# Patient Record
Sex: Male | Born: 1960 | Race: Black or African American | Hispanic: No | Smoking: Former smoker
Health system: Southern US, Community
[De-identification: ages and names within clinical notes are randomized; demographics above are authoritative.]

## PROBLEM LIST (undated history)

## (undated) DIAGNOSIS — D72829 Elevated white blood cell count, unspecified: Secondary | ICD-10-CM

## (undated) DIAGNOSIS — K439 Ventral hernia without obstruction or gangrene: Secondary | ICD-10-CM

## (undated) DIAGNOSIS — D689 Coagulation defect, unspecified: Secondary | ICD-10-CM

## (undated) DIAGNOSIS — IMO0001 Reserved for inherently not codable concepts without codable children: Secondary | ICD-10-CM

## (undated) DIAGNOSIS — J45909 Unspecified asthma, uncomplicated: Secondary | ICD-10-CM

## (undated) DIAGNOSIS — F209 Schizophrenia, unspecified: Secondary | ICD-10-CM

## (undated) DIAGNOSIS — F32A Depression, unspecified: Secondary | ICD-10-CM

## (undated) DIAGNOSIS — R918 Other nonspecific abnormal finding of lung field: Secondary | ICD-10-CM

## (undated) DIAGNOSIS — J189 Pneumonia, unspecified organism: Secondary | ICD-10-CM

## (undated) DIAGNOSIS — Z8719 Personal history of other diseases of the digestive system: Secondary | ICD-10-CM

## (undated) DIAGNOSIS — J449 Chronic obstructive pulmonary disease, unspecified: Secondary | ICD-10-CM

## (undated) DIAGNOSIS — I10 Essential (primary) hypertension: Secondary | ICD-10-CM

## (undated) DIAGNOSIS — E876 Hypokalemia: Secondary | ICD-10-CM

## (undated) DIAGNOSIS — I639 Cerebral infarction, unspecified: Secondary | ICD-10-CM

## (undated) DIAGNOSIS — J9621 Acute and chronic respiratory failure with hypoxia: Secondary | ICD-10-CM

## (undated) DIAGNOSIS — F329 Major depressive disorder, single episode, unspecified: Secondary | ICD-10-CM

## (undated) DIAGNOSIS — K429 Umbilical hernia without obstruction or gangrene: Secondary | ICD-10-CM

## (undated) HISTORY — PX: HERNIA REPAIR: SHX51

---

## 2005-01-21 ENCOUNTER — Emergency Department: Payer: Self-pay | Admitting: Emergency Medicine

## 2005-04-03 ENCOUNTER — Emergency Department: Payer: Self-pay | Admitting: Emergency Medicine

## 2005-10-16 ENCOUNTER — Other Ambulatory Visit: Payer: Self-pay

## 2005-10-16 ENCOUNTER — Emergency Department: Payer: Self-pay | Admitting: Emergency Medicine

## 2007-05-26 ENCOUNTER — Emergency Department: Payer: Self-pay | Admitting: Emergency Medicine

## 2007-05-27 ENCOUNTER — Other Ambulatory Visit: Payer: Self-pay

## 2010-06-23 ENCOUNTER — Ambulatory Visit: Payer: Self-pay | Admitting: Gastroenterology

## 2011-11-02 ENCOUNTER — Inpatient Hospital Stay: Payer: Self-pay | Admitting: *Deleted

## 2011-11-02 LAB — URINALYSIS, COMPLETE
Bacteria: NONE SEEN
Bilirubin,UR: NEGATIVE
Glucose,UR: NEGATIVE mg/dL (ref 0–75)
Ketone: NEGATIVE
Ph: 5 (ref 4.5–8.0)
Protein: NEGATIVE
RBC,UR: <1 /HPF (ref 0–5)
Specific Gravity: 1.024 (ref 1.003–1.030)
Squamous Epithelial: <1

## 2011-11-02 LAB — COMPREHENSIVE METABOLIC PANEL
Anion Gap: 15 (ref 7–16)
BUN: 12 mg/dL (ref 7–18)
Chloride: 102 mmol/L (ref 98–107)
Co2: 20 mmol/L — ABNORMAL LOW (ref 21–32)
EGFR (African American): >60
Glucose: 115 mg/dL — ABNORMAL HIGH (ref 65–99)
Osmolality: 274 (ref 275–301)
Potassium: 3.5 mmol/L (ref 3.5–5.1)
Total Protein: 7.7 g/dL (ref 6.4–8.2)

## 2011-11-02 LAB — PRO B NATRIURETIC PEPTIDE: B-Type Natriuretic Peptide: 232 pg/mL — ABNORMAL HIGH (ref 0–125)

## 2011-11-02 LAB — DRUG SCREEN, URINE
Amphetamines, Ur Screen: NEGATIVE (ref ?–1000)
Barbiturates, Ur Screen: NEGATIVE (ref ?–200)
Cannabinoid 50 Ng, Ur ~~LOC~~: NEGATIVE (ref ?–50)
Methadone, Ur Screen: NEGATIVE (ref ?–300)
Phencyclidine (PCP) Ur S: NEGATIVE (ref ?–25)
Tricyclic, Ur Screen: NEGATIVE (ref ?–1000)

## 2011-11-02 LAB — CBC
HCT: 38.4 % — ABNORMAL LOW (ref 40.0–52.0)
MCH: 31.8 pg (ref 26.0–34.0)
Platelet: 172 10*3/uL (ref 150–440)
RBC: 3.99 10*6/uL — ABNORMAL LOW (ref 4.40–5.90)

## 2011-11-02 LAB — PROTIME-INR
INR: 1.2
Prothrombin Time: 15.4 secs — ABNORMAL HIGH (ref 11.5–14.7)

## 2011-11-02 LAB — CK TOTAL AND CKMB (NOT AT ARMC): CK, Total: 2388 U/L — ABNORMAL HIGH (ref 35–232)

## 2011-11-03 LAB — BASIC METABOLIC PANEL
Anion Gap: 10 (ref 7–16)
Calcium, Total: 8.9 mg/dL (ref 8.5–10.1)
Chloride: 109 mmol/L — ABNORMAL HIGH (ref 98–107)
Co2: 23 mmol/L (ref 21–32)
Creatinine: 0.77 mg/dL (ref 0.60–1.30)
EGFR (African American): >60
Potassium: 4.3 mmol/L (ref 3.5–5.1)
Sodium: 142 mmol/L (ref 136–145)

## 2011-11-03 LAB — CBC WITH DIFFERENTIAL/PLATELET
Basophil #: 0 10*3/uL (ref 0.0–0.1)
Basophil %: 0.1 %
Eosinophil #: 0 10*3/uL (ref 0.0–0.7)
HCT: 36.6 % — ABNORMAL LOW (ref 40.0–52.0)
HGB: 12 g/dL — ABNORMAL LOW (ref 13.0–18.0)
MCH: 32.1 pg (ref 26.0–34.0)
MCHC: 32.7 g/dL (ref 32.0–36.0)
MCV: 98 fL (ref 80–100)
Monocyte #: 0.5 10*3/uL (ref 0.0–0.7)
Monocyte %: 3.9 %
Neutrophil #: 10.6 10*3/uL — ABNORMAL HIGH (ref 1.4–6.5)
Neutrophil %: 88.6 %
RBC: 3.72 10*6/uL — ABNORMAL LOW (ref 4.40–5.90)
RDW: 14 % (ref 11.5–14.5)
WBC: 12 10*3/uL — ABNORMAL HIGH (ref 3.8–10.6)

## 2011-11-04 LAB — CBC WITH DIFFERENTIAL/PLATELET
Basophil #: 0 10*3/uL (ref 0.0–0.1)
Basophil %: 0 %
Eosinophil #: 0 10*3/uL (ref 0.0–0.7)
Eosinophil %: 0 %
HCT: 36 % — ABNORMAL LOW (ref 40.0–52.0)
Lymphocyte #: 1.1 10*3/uL (ref 1.0–3.6)
Lymphocyte %: 7.1 %
MCH: 32.3 pg (ref 26.0–34.0)
MCHC: 33 g/dL (ref 32.0–36.0)
Monocyte #: 0.6 10*3/uL (ref 0.0–0.7)
Neutrophil #: 13.3 10*3/uL — ABNORMAL HIGH (ref 1.4–6.5)
Neutrophil %: 88.9 %
Platelet: 171 10*3/uL (ref 150–440)
RBC: 3.68 10*6/uL — ABNORMAL LOW (ref 4.40–5.90)

## 2011-11-04 LAB — CK: CK, Total: 2752 U/L — ABNORMAL HIGH (ref 35–232)

## 2011-11-04 LAB — BASIC METABOLIC PANEL
Anion Gap: 10 (ref 7–16)
Calcium, Total: 8.8 mg/dL (ref 8.5–10.1)
Co2: 23 mmol/L (ref 21–32)
EGFR (Non-African Amer.): >60
Osmolality: 280 (ref 275–301)
Potassium: 3.8 mmol/L (ref 3.5–5.1)

## 2011-11-05 LAB — CBC WITH DIFFERENTIAL/PLATELET
Basophil %: 0.2 %
Eosinophil #: 0 10*3/uL (ref 0.0–0.7)
Eosinophil %: 0 %
HCT: 34 % — ABNORMAL LOW (ref 40.0–52.0)
Lymphocyte #: 0.8 10*3/uL — ABNORMAL LOW (ref 1.0–3.6)
MCH: 32.3 pg (ref 26.0–34.0)
MCHC: 33.1 g/dL (ref 32.0–36.0)
MCV: 98 fL (ref 80–100)
Platelet: 153 10*3/uL (ref 150–440)
RDW: 14.4 % (ref 11.5–14.5)

## 2011-11-05 LAB — BASIC METABOLIC PANEL
Anion Gap: 11 (ref 7–16)
BUN: 11 mg/dL (ref 7–18)
Calcium, Total: 8.3 mg/dL — ABNORMAL LOW (ref 8.5–10.1)
Co2: 23 mmol/L (ref 21–32)
Creatinine: 0.74 mg/dL (ref 0.60–1.30)
Osmolality: 286 (ref 275–301)
Potassium: 3.7 mmol/L (ref 3.5–5.1)

## 2011-11-05 LAB — CK: CK, Total: 1173 U/L — ABNORMAL HIGH (ref 35–232)

## 2011-11-06 LAB — BASIC METABOLIC PANEL
Anion Gap: 9 (ref 7–16)
BUN: 12 mg/dL (ref 7–18)
Chloride: 108 mmol/L — ABNORMAL HIGH (ref 98–107)
Co2: 25 mmol/L (ref 21–32)
Creatinine: 0.76 mg/dL (ref 0.60–1.30)
EGFR (Non-African Amer.): >60
Glucose: 129 mg/dL — ABNORMAL HIGH (ref 65–99)
Potassium: 3.5 mmol/L (ref 3.5–5.1)
Sodium: 142 mmol/L (ref 136–145)

## 2011-11-07 LAB — CULTURE, BLOOD (SINGLE)

## 2011-11-08 LAB — BASIC METABOLIC PANEL
Anion Gap: 8 (ref 7–16)
Chloride: 107 mmol/L (ref 98–107)
Creatinine: 0.72 mg/dL (ref 0.60–1.30)
EGFR (African American): >60
EGFR (Non-African Amer.): >60
Glucose: 128 mg/dL — ABNORMAL HIGH (ref 65–99)
Osmolality: 285 (ref 275–301)
Potassium: 3.6 mmol/L (ref 3.5–5.1)
Sodium: 142 mmol/L (ref 136–145)

## 2011-11-10 LAB — BASIC METABOLIC PANEL
Anion Gap: 9 (ref 7–16)
BUN: 13 mg/dL (ref 7–18)
Calcium, Total: 8.2 mg/dL — ABNORMAL LOW (ref 8.5–10.1)
Co2: 28 mmol/L (ref 21–32)
EGFR (African American): >60
EGFR (Non-African Amer.): >60
Glucose: 116 mg/dL — ABNORMAL HIGH (ref 65–99)
Osmolality: 286 (ref 275–301)
Potassium: 3.6 mmol/L (ref 3.5–5.1)

## 2011-11-10 LAB — PLATELET COUNT: Platelet: 206 10*3/uL (ref 150–440)

## 2013-08-18 ENCOUNTER — Inpatient Hospital Stay: Payer: Self-pay | Admitting: Internal Medicine

## 2013-08-18 LAB — COMPREHENSIVE METABOLIC PANEL
ALBUMIN: 3.8 g/dL (ref 3.4–5.0)
ALK PHOS: 86 U/L
ANION GAP: 5 — AB (ref 7–16)
BILIRUBIN TOTAL: 0.5 mg/dL (ref 0.2–1.0)
BUN: 11 mg/dL (ref 7–18)
CO2: 26 mmol/L (ref 21–32)
Calcium, Total: 9 mg/dL (ref 8.5–10.1)
Chloride: 104 mmol/L (ref 98–107)
Creatinine: 0.84 mg/dL (ref 0.60–1.30)
EGFR (African American): >60
EGFR (Non-African Amer.): >60
Glucose: 120 mg/dL — ABNORMAL HIGH (ref 65–99)
Osmolality: 271 (ref 275–301)
Potassium: 3.8 mmol/L (ref 3.5–5.1)
SGOT(AST): 37 U/L (ref 15–37)
SGPT (ALT): 39 U/L (ref 12–78)
Sodium: 135 mmol/L — ABNORMAL LOW (ref 136–145)
Total Protein: 7.6 g/dL (ref 6.4–8.2)

## 2013-08-18 LAB — CBC WITH DIFFERENTIAL/PLATELET
BASOS ABS: 0 10*3/uL (ref 0.0–0.1)
BASOS PCT: 0.4 %
EOS PCT: 1 %
Eosinophil #: 0.1 10*3/uL (ref 0.0–0.7)
HCT: 40.5 % (ref 40.0–52.0)
HGB: 13.9 g/dL (ref 13.0–18.0)
LYMPHS PCT: 15 %
Lymphocyte #: 1.3 10*3/uL (ref 1.0–3.6)
MCH: 31.4 pg (ref 26.0–34.0)
MCHC: 34.3 g/dL (ref 32.0–36.0)
MCV: 92 fL (ref 80–100)
MONO ABS: 1 x10 3/mm (ref 0.2–1.0)
MONOS PCT: 11.3 %
Neutrophil #: 6.4 10*3/uL (ref 1.4–6.5)
Neutrophil %: 72.3 %
PLATELETS: 150 10*3/uL (ref 150–440)
RBC: 4.41 10*6/uL (ref 4.40–5.90)
RDW: 14.3 % (ref 11.5–14.5)
WBC: 8.8 10*3/uL (ref 3.8–10.6)

## 2013-08-18 LAB — TROPONIN I: TROPONIN-I: 0.03 ng/mL

## 2013-08-18 LAB — LIPASE, BLOOD: LIPASE: 62 U/L — AB (ref 73–393)

## 2013-08-19 LAB — BASIC METABOLIC PANEL
ANION GAP: 7 (ref 7–16)
BUN: 12 mg/dL (ref 7–18)
Calcium, Total: 9.6 mg/dL (ref 8.5–10.1)
Chloride: 102 mmol/L (ref 98–107)
Co2: 23 mmol/L (ref 21–32)
Creatinine: 0.88 mg/dL (ref 0.60–1.30)
EGFR (African American): >60
EGFR (Non-African Amer.): >60
Glucose: 186 mg/dL — ABNORMAL HIGH (ref 65–99)
Osmolality: 269 (ref 275–301)
Potassium: 4.4 mmol/L (ref 3.5–5.1)
Sodium: 132 mmol/L — ABNORMAL LOW (ref 136–145)

## 2013-08-19 LAB — CBC WITH DIFFERENTIAL/PLATELET
BASOS PCT: 0.4 %
Basophil #: 0 10*3/uL (ref 0.0–0.1)
Eosinophil #: 0 10*3/uL (ref 0.0–0.7)
Eosinophil %: 0 %
HCT: 41 % (ref 40.0–52.0)
HGB: 13.7 g/dL (ref 13.0–18.0)
LYMPHS PCT: 10.5 %
Lymphocyte #: 1.4 10*3/uL (ref 1.0–3.6)
MCH: 31.2 pg (ref 26.0–34.0)
MCHC: 33.4 g/dL (ref 32.0–36.0)
MCV: 94 fL (ref 80–100)
Monocyte #: 0.6 x10 3/mm (ref 0.2–1.0)
Monocyte %: 4.2 %
NEUTROS ABS: 11.3 10*3/uL — AB (ref 1.4–6.5)
Neutrophil %: 84.9 %
Platelet: 179 10*3/uL (ref 150–440)
RBC: 4.38 10*6/uL — ABNORMAL LOW (ref 4.40–5.90)
RDW: 14.8 % — AB (ref 11.5–14.5)
WBC: 13.3 10*3/uL — ABNORMAL HIGH (ref 3.8–10.6)

## 2013-08-20 LAB — BASIC METABOLIC PANEL
Anion Gap: 6 — ABNORMAL LOW (ref 7–16)
BUN: 19 mg/dL — ABNORMAL HIGH (ref 7–18)
CALCIUM: 9.3 mg/dL (ref 8.5–10.1)
CO2: 26 mmol/L (ref 21–32)
Chloride: 102 mmol/L (ref 98–107)
Creatinine: 0.85 mg/dL (ref 0.60–1.30)
EGFR (African American): >60
EGFR (Non-African Amer.): >60
GLUCOSE: 153 mg/dL — AB (ref 65–99)
OSMOLALITY: 274 (ref 275–301)
POTASSIUM: 4.2 mmol/L (ref 3.5–5.1)
SODIUM: 134 mmol/L — AB (ref 136–145)

## 2013-08-20 LAB — CBC WITH DIFFERENTIAL/PLATELET
BASOS PCT: 0.1 %
Basophil #: 0 10*3/uL (ref 0.0–0.1)
EOS ABS: 0 10*3/uL (ref 0.0–0.7)
Eosinophil %: 0 %
HCT: 40.4 % (ref 40.0–52.0)
HGB: 13.4 g/dL (ref 13.0–18.0)
LYMPHS PCT: 8.3 %
Lymphocyte #: 1.4 10*3/uL (ref 1.0–3.6)
MCH: 30.7 pg (ref 26.0–34.0)
MCHC: 33.1 g/dL (ref 32.0–36.0)
MCV: 93 fL (ref 80–100)
MONOS PCT: 5.9 %
Monocyte #: 1 x10 3/mm (ref 0.2–1.0)
NEUTROS PCT: 85.7 %
Neutrophil #: 14.8 10*3/uL — ABNORMAL HIGH (ref 1.4–6.5)
Platelet: 177 10*3/uL (ref 150–440)
RBC: 4.35 10*6/uL — AB (ref 4.40–5.90)
RDW: 14.8 % — AB (ref 11.5–14.5)
WBC: 17.3 10*3/uL — ABNORMAL HIGH (ref 3.8–10.6)

## 2013-08-21 LAB — CBC WITH DIFFERENTIAL/PLATELET
Basophil #: 0 10*3/uL (ref 0.0–0.1)
Basophil %: 0.1 %
EOS PCT: 0 %
Eosinophil #: 0 10*3/uL (ref 0.0–0.7)
HCT: 38.9 % — ABNORMAL LOW (ref 40.0–52.0)
HGB: 13 g/dL (ref 13.0–18.0)
LYMPHS ABS: 1.2 10*3/uL (ref 1.0–3.6)
Lymphocyte %: 8.7 %
MCH: 31 pg (ref 26.0–34.0)
MCHC: 33.4 g/dL (ref 32.0–36.0)
MCV: 93 fL (ref 80–100)
MONO ABS: 0.5 x10 3/mm (ref 0.2–1.0)
MONOS PCT: 4 %
Neutrophil #: 12 10*3/uL — ABNORMAL HIGH (ref 1.4–6.5)
Neutrophil %: 87.2 %
PLATELETS: 164 10*3/uL (ref 150–440)
RBC: 4.18 10*6/uL — AB (ref 4.40–5.90)
RDW: 14.3 % (ref 11.5–14.5)
WBC: 13.8 10*3/uL — ABNORMAL HIGH (ref 3.8–10.6)

## 2013-08-23 LAB — CULTURE, BLOOD (SINGLE)

## 2013-09-30 ENCOUNTER — Ambulatory Visit: Payer: Self-pay | Admitting: Gastroenterology

## 2013-10-11 ENCOUNTER — Ambulatory Visit: Payer: Self-pay | Admitting: Gastroenterology

## 2013-12-27 ENCOUNTER — Emergency Department: Payer: Self-pay | Admitting: Emergency Medicine

## 2013-12-27 LAB — URINALYSIS, COMPLETE
BACTERIA: NONE SEEN
BLOOD: NEGATIVE
Bilirubin,UR: NEGATIVE
Glucose,UR: NEGATIVE mg/dL (ref 0–75)
Ketone: NEGATIVE
Leukocyte Esterase: NEGATIVE
Nitrite: NEGATIVE
Ph: 6 (ref 4.5–8.0)
Protein: NEGATIVE
RBC,UR: NONE SEEN /HPF (ref 0–5)
SQUAMOUS EPITHELIAL: NONE SEEN
Specific Gravity: 1.002 (ref 1.003–1.030)
WBC UR: <1 /HPF (ref 0–5)

## 2013-12-27 LAB — CBC
HCT: 38.7 % — AB (ref 40.0–52.0)
HGB: 13 g/dL (ref 13.0–18.0)
MCH: 31.9 pg (ref 26.0–34.0)
MCHC: 33.5 g/dL (ref 32.0–36.0)
MCV: 95 fL (ref 80–100)
Platelet: 198 10*3/uL (ref 150–440)
RBC: 4.06 10*6/uL — ABNORMAL LOW (ref 4.40–5.90)
RDW: 14.4 % (ref 11.5–14.5)
WBC: 10.7 10*3/uL — AB (ref 3.8–10.6)

## 2013-12-27 LAB — COMPREHENSIVE METABOLIC PANEL
ANION GAP: 8 (ref 7–16)
Albumin: 3.4 g/dL (ref 3.4–5.0)
Alkaline Phosphatase: 78 U/L
BILIRUBIN TOTAL: 0.3 mg/dL (ref 0.2–1.0)
BUN: 6 mg/dL — ABNORMAL LOW (ref 7–18)
CALCIUM: 9 mg/dL (ref 8.5–10.1)
CREATININE: 0.78 mg/dL (ref 0.60–1.30)
Chloride: 107 mmol/L (ref 98–107)
Co2: 25 mmol/L (ref 21–32)
EGFR (African American): >60
EGFR (Non-African Amer.): >60
GLUCOSE: 130 mg/dL — AB (ref 65–99)
Osmolality: 279 (ref 275–301)
POTASSIUM: 3.6 mmol/L (ref 3.5–5.1)
SGOT(AST): 22 U/L (ref 15–37)
SGPT (ALT): 30 U/L (ref 12–78)
Sodium: 140 mmol/L (ref 136–145)
Total Protein: 7 g/dL (ref 6.4–8.2)

## 2014-01-30 DIAGNOSIS — S8010XA Contusion of unspecified lower leg, initial encounter: Secondary | ICD-10-CM | POA: Insufficient documentation

## 2014-04-16 ENCOUNTER — Ambulatory Visit: Payer: Self-pay | Admitting: Family Medicine

## 2014-10-18 ENCOUNTER — Emergency Department: Payer: Self-pay | Admitting: Emergency Medicine

## 2014-10-21 ENCOUNTER — Ambulatory Visit: Payer: Self-pay | Admitting: Family Medicine

## 2014-11-05 ENCOUNTER — Ambulatory Visit
Admit: 2014-11-05 | Disposition: A | Discharge status: Home / Self Care | Payer: Self-pay | Attending: Surgery | Admitting: Surgery

## 2014-11-05 LAB — HEPATIC FUNCTION PANEL A (ARMC)
ALBUMIN: 4.3 g/dL
Alkaline Phosphatase: 72 U/L
Bilirubin,Total: 0.3 mg/dL
SGOT(AST): 21 U/L
SGPT (ALT): 20 U/L
Total Protein: 7.4 g/dL

## 2014-11-13 ENCOUNTER — Ambulatory Visit
Admit: 2014-11-13 | Disposition: A | Discharge status: Home / Self Care | Payer: Self-pay | Attending: Surgery | Admitting: Surgery

## 2014-11-29 NOTE — H&P (Signed)
PATIENT NAME:  [name], [name] MR#:  [id number] DATE OF BIRTH:  1961/07/04  DATE OF ADMISSION:  08/18/2013  PRIMARY CARE PHYSICIAN: [facility].  CHIEF COMPLAINT: Shortness of breath and wheezing for four days.   HISTORY OF PRESENT ILLNESS: A 54 year old very pleasant male with a history of COPD, tobacco dependence, hypertension., past alcohol, cocaine, marijuana abuse and schizoaffective disorder, who presents with the above complaint. Over the past 4 days he has had increasing shortness of breath and wheezing with dyspnea on exertion, low grade fevers, chills, no nausea or vomiting, no lower extremity edema. He also had cough with green to yellow sputum. In the ER, he has received three nebulizer, but he is still 90% on  liters.  REVIEW OF SYSTEMS:  CONSTITUTIONAL: Positive fever. Positive fatigue, weakness. No weight loss or gain.  EYES: No blurry vision.  ENT: No ear pain, hearing loss, seasonal allergies, postnasal drip.  RESPIRATORY: Positive cough, positive wheezing. Positive dyspnea. Positive COPD, not on oxygen.  CARDIOVASCULAR: No chest pain, orthopnea,no arrhythmia. Positive dyspnea on exertion, no palpitations, syncope. GASTROINTESTINAL:  Positive nausea. No vomiting, diarrhea, abdominal pain, melena, or ulcers.  GENITOURINARY: No dysuria or hematuria.  ENDOCRINE: No polyuria or polydipsia.  HEMATOLOGIC AND LYMPHATIC: No anemia, easy bruising.  SKIN: No rash or lesion.  MUSCULOSKELETAL: No arthritis.  NEUROLOGICAL:  No history of CVA, TIA or seizures.  PSYCHOLOGIC: Positive history of schizoaffective disorder.  PAST MEDICAL HISTORY:   1. Asthma/COPD.  2. History of cerebrovascular accident.  3. Hypertension.  4. Alcohol cocaine, marijuana abuse. 5.  Schizoaffective disorder. 6. Tobacco abuse.   ALLERGIES: No known drug allergies.   MEDICATIONS: We do not have a complete list of his medications at this time.   FAMILY HISTORY: Positive for coronary artery disease.    SOCIAL HISTORY: The patient smokes 1 every 2 to 3 days. Occasional alcohol use. He denies any IV drug use.   SURGICAL HISTORY: Hernia repair.   PHYSICAL EXAMINATION: VITAL SIGNS: Temperature 98.7, pulse is 111, respirations 24, blood pressure 152/80 to 92% on 2 liters.  IN GENERAL: The patient is alert in moderate distress due to his coughing.  HEENT: Head is atraumatic. Pupils are roundand reactive  Sclerae are anicteric, mucous membranes are dry. Oropharynx clear.  NECK: Supple without JVD, carotid bruit or enlarged thyroid.  CARDIOVASCULAR: Tachycardia without murmur, gallops, or rubs. PMI is nondisplaced.  LUNGS: He has got decreased air movement throughout his lung fields. Lung sounds he sounds very tight. He has minimal wheezing bilaterally. No audible crackles, rales are heard. Normal chest expansion.  BACK: No CVA or vertebral tenderness.  ABDOMEN: Obese. Bowel sounds positive. Nontender. Hard to appreciate organomegaly due to body habitus. No guarding, rebound.  EXTREMITIES: No clubbing, cyanosis or edema. SKIN: No rash or lesion. NEUROLOGIC: Cranial nerves II through XII are intact. There are no focal deficits. Finger-nose is intact.  MUSCULOSKELETAL: 4/4 strength bilaterally and symmetrically.   LABORATORY, DIAGNOSTIC AND RADIOLOGICAL DATA: White blood cells 8.8, hemoglobin 15.9, hematocrit 41, platelets 150, sodium 135, potassium 3.8, chloride 104, bicarbonate 26, BUN 11, creatinine 0.84, glucose 120, bilirubin 0.5, alkaline phosphatase 86, ALT 39, AST 37, total protein 7.6, albumin 3.8. Lipase is 62. Troponin 0.03. Chest x-ray shows a right middle lobe pneumonia.   EKG: Sinus tachycardia. No ST elevation or depression.   ASSESSMENT AND PLAN: A 54 year old male with history of asthma/chronic obstructive pulmonary disease, who presents with acute respiratory failure.  1. Acute respiratory failure from chronic obstructive pulmonary disease  exacerbation as well as community  acquired pneumonia. Plan as outlined below.  2. Pneumonia. Community-acquired. Blood cultures have been taken in the ER. We will continue on Levaquin 750 mg IV q.24h. Follow-up on blood cultures.  3. Chronic obstructive pulmonary disease exacerbation secondary to community-acquired pneumonia. The patient will be started on IV steroids, Xopenex inhalers with Spiriva, oxygen, we will monitor closely.  4. Sepsis secondary to community-acquired pneumonia. We will follow up on blood cultures. Continue antibiotics. Monitor the patient closely.  5. History of hypertension. We do not have the outpatient medication list. We will ask the nurse to obtain the complete medication list and for now we will use hydralazine p.r.n.  6. Schizoaffective disorder. Again will need to follow up on outpatient medications.  7. Tobacco abuse: The patient was counseled for 3.5 minutes regarding his smoking.He does not express desire to quit smoking.  8. CODE STATUS: THE PATIENT IS FULL CODE.  TIME SPENT: Approximately 50 minutes.    ____________________________ Donell Beers. Benjie Karvonen, MD spm:sg D: 08/18/2013 10:09:59 ET T: 08/18/2013 10:58:58 ET JOB#: [id number]  cc: Oaklynn Stierwalt P. Benjie Karvonen, MD, <Dictator> [place] P Chanteria Haggard MD ELECTRONICALLY SIGNED 08/19/2013 14:31

## 2014-11-29 NOTE — Discharge Summary (Signed)
PATIENT NAME:  [name] DATE OF BIRTH:  1960/08/21  DATE OF ADMISSION:  08/18/2013 DATE OF DISCHARGE: 08/22/2013  ADMISSION DIAGNOSIS: Acute respiratory failure secondary to chronic obstructive pulmonary disease exacerbation and pneumonia.   DISCHARGE DIAGNOSES: 1. Acute respiratory failure secondary to pneumonia, as well as acute chronic obstructive pulmonary disease exacerbation.  2. Pneumonia, likely with a slight component of aspiration as per speech evaluation secondary to reflux disease.  3. Chronic obstructive pulmonary disease acute exacerbation.  4. History of cerebrovascular accident.  5. History of scizoaffective  disorder.  6. Aspiration likely from gastroesophageal reflux disease.   CONSULTATIONS: Electrical engineer.   LABORATORIES AT DISCHARGE: White count 13, hemoglobin 13, hematocrit 39, platelets are 164. Sodium 134, potassium 4.2, chloride 102, bicarbonate 26, BUN 19, creatinine 0.85 glucose is 183. Blood culture negative to date.   HOSPITAL COURSE: A 54 year old male with a history of smoking dependence, who presents with acute respiratory failure. For further details, please refer to history and physical  1. Acute respiratory failure thought to be secondary to pneumonia and chronic obstructive pulmonary disease. Plan as outlined below.  2. Pneumonia. The patient had a chest x-ray which was consistent with a right middle lobe pneumonia. He was placed on Zosyn as well as Levaquin. I did have speech pathology come and see the patient regarding the pneumonia to rule out aspiration. He says that he actually has seen GI in the past and he needed a stretching of his esophagus. She felt that the patient may have some kind of reflux causing some mild aspiration. I did recommend that the patient see a GI as an outpatient once his acute pulmonary issues have resolved. He is afebrile throughout hospitalization. His white blood cell count has improved. It was changed to  Augmentin, Levaquin at discharge. His blood cultures negative to date.  3. Chronic obstructive pulmonary disease acute exacerbation precipitated by pneumonia. He was treated with IV steroids, inhalers and nebulizers. He will require oxygen at discharge.  4. History of cerebrovascular accident. The patient will continue on statin and Plavix. 5. Schizoaffective disorder. The patient will continue outpatient medications.  6. Aspiration likely from GERD. The patient is continued on PPI. The patient will follow up with GI and this should be arranged prior to his discharge.   DISCHARGE MEDICATIONS: 1. Phentandyl   2 mg b.i.d. 2. Fluoxetine 20 mg daily.  3. Aspirin 81 mg daily.  4. Diltiazem 180 mg daily.  5. Atenolol 50 mg daily.  6. HCTZ 25 mg half tablet daily. 7. KCl 20 mEq daily.  8. Spiriva 18 mcg daily.  9. Plavix 75 mg daily.  10. Vitamin D3 50,000 international units daily.  11. Hydrocortisone topical to affected area.  12. Symbicort 2 puffs b.i.d.  13. Quetiapine 200 mg 1 tablet at bedtime.  14. Docusate 100 mg 2 tablets at bedtime.  15. Lovastatin 40 mg daily.  16. Tylenol 325, 2 tablets b.i.d. p.r.n.  17. Albuterol ipratropium one vial q. 4 -6 h. p.r.n.  18. ProAir 2 puffs 4 times a day.  19. Tussionex 5 mL q.6 hours p.r.n. cough.  20. Promethazine 25 mg half tablet q.6h. p.r.n.  21. Risperdal 37.5 mg intramuscular every two weeks.  22. Nicotine 21 mg per 24 hours.  23. Pantoprazole 40 mg b.i.d.  24. Augmentin 875 1 tablet b.i.d.  25. Prednisone 60 mg daily x 2 days, then taper x 10 mg every two days.  26. Levaquin 750 mg daily for six days.  DISCHARGE DIET: Low sodium diet.   DISCHARGE ACTIVITY: As tolerated.   DISCHARGE OXYGEN: 2 liters nasal cannula.   The patient will follow up with Dr. Salome Holmes in one week.   TIME SPENT: 45 minutes. The patient is medically stable for discharge.   ____________________________ Jacob Thon P. Benjie Karvonen, MD spm:sg D: 08/22/2013  12:22:32 ET T: 08/22/2013 12:56:30 ET JOB#: [id number]  cc: Arelly Whittenberg P. Benjie Karvonen, MD, <Dictator> Salome Holmes, MD  Donell Beers Tashaun Obey MD ELECTRONICALLY SIGNED 08/22/2013 19:29

## 2014-11-30 NOTE — H&P (Signed)
PATIENT NAME:  [name], [name] MR#:  [id number] DATE OF BIRTH:  09-Apr-1961  DATE OF ADMISSION:  11/02/2011  ADDENDUM: The patient is still tachypneic with rates of 44. He is still short of breath and wheezing. He is having some abdominal breathing, increased work of breathing. I am going to start him on BiPAP. I will admit him to the intensive care unit for closer respiratory monitoring. He has already received magnesium sulfate. He is on Solu-Medrol, DuoNebs, and IV antibiotics.  ____________________________ Mena Pauls, MD ag:slb D: 11/02/2011 13:22:40 ET T: 11/02/2011 13:46:30 ET JOB#: [id number]  cc: Mena Pauls, MD, <Dictator> Mena Pauls MD ELECTRONICALLY SIGNED 12/02/2011 7:26

## 2014-11-30 NOTE — Discharge Summary (Signed)
PATIENT NAME:  [name], [name] MR#:  [id number] DATE OF BIRTH:  Oct 23, 1960  DATE OF ADMISSION:  11/02/2011 DATE OF DISCHARGE:  11/10/2011   Acute Hypoxic Respiratory failure Asthma/COPD exacerbation Pneumonia Rhabdomyolysis HTN Schizoaffective disorder  CONSULTS: Pulmonology, Dr. Mortimer Fries   HOSPITAL COURSE: This is a 54 year old male with history of asthma, chronic obstructive pulmonary disease, hypertension, schizoaffective disorder, and hyperlipidemia. He presented with severe shortness of breath, and he presented to the Emergency Room in respiratory distress. When he presented to the Emergency Room he was tachycardic, tachypneic, with a respiratory rate of 42, saturating 90% on room air. He was admitted as acute hypoxic respiratory failure, asthma, chronic obstructive pulmonary disease exacerbation, pneumonia, and rhabdomyolysis. When he came in, he had a chest x-ray done. Chest x-ray showed prominence of the right hilum and right infrahilar area secondary to incomplete inspiration. He also had a CT of the chest done. CT chest ruled out any pulmonary embolism but showed mild opacity in the posterior lower lobe, may represent atelectasis but infectious or inflammatory etiology is raised given the appearance of the right lower lobe. Mildly enlarged mediastinal lymph node, which is nonspecific. The patient was started on IV ceftriaxone, IV Zithromax, IV Solu-Medrol, DuoNebs, Advair, and Spiriva. ABG done at admission showed pH of 7.39, pCO2 33, pO2 57, and 28% FiO2. The patient was admitted to the Intensive Care Unit because the patient required BiPAP because of his respiratory distress. Gradually he was taken off the BiPAP. Dr. Mortimer Fries, pulmonologist, saw him in the Intensive Care Unit. He was very slow to improve. He has completed his antibiotics with ceftriaxone and Zithromax. He completed eight days of ceftriaxone and five days of Zithromax. We have changed his Solu-Medrol to a slow prednisone taper. We will  give him Advair, Spiriva, and albuterol inhaler along with DuoNebs at discharge along with prednisone taper. When he came in, he also was in rhabdomyolysis. His CPKs were in the range of 2388 and went up to 5000. His urine drug screen was negative. His statin was held. He received aggressive IV hydration and his CPK improved to 699.  I will keep him off the statin for now. Because of his severe bronchospasm we stopped his beta blocker and I have increased his Cardizem to 180 mg daily. His urinalysis is negative. When he came in his blood cultures remained negative. At discharge his creatinine is stable at 0.78. His platelet count is stable at 206,000. PT was also involved in his care. He did well with physical therapy. He does not require home oxygen at this time.  His sats are 94% on room air and he remained above 90% on ambulation.    DISCHARGE MEDICATIONS:  1. Hydrochlorothiazide 25 mg, 1/2 tablet daily.  2. Potassium chloride 10 mEq daily.  3. Colace 100 mg, 2 tablets once a day.  4. Prozac 20 mg daily.  5. Seroquel 200 mg daily at bedtime.  6. Aggrenox 1 tab b.i.d.  7. MiraLAX twice a day as needed for constipation. 8. Tylenol 2 tabs every 4 hours as needed. 9. Risperdal injection 37.5 mg IM every two weeks.  10. ProAir 2 puffs 4 times a day as needed for shortness of breath.   NEW MEDICATIONS:  1. Prednisone taper as directed.  2. Advair Diskus 250/50, 1 puff b.i.d.  3. Spiriva HandiHaler, one puff once daily. 4. Albuterol nebulizers. 5. Atrovent nebulizers. 6. DuoNebs q. 4 to 6 hours as needed for wheezing or shortness of breath.  7. Change Cardizem  CD to 180 mg p.o. once daily. 8. Tessalon Perles 100 mg p.o. q. 6 hours as needed for cough.  NOTE: Do not take Lovastatin.    NOTE: Do not take atenolol.   DIET: Low sodium, low cholesterol diet.   CONDITION AT DISCHARGE: Stable. T-max 98, heart rate 90, blood pressure 127/74, saturating 94% on room air. Chest is clear.  Wheezing  has resolved, slightly diminished.  Heart sounds are regular. Abdomen soft, nontender.   FOLLOWUP:  1. The patient should follow up with Dr. Salome Holmes in one week at [facility]. The patient should have a follow-up CBC, BMP at the [facility] 2. Please make an appointment with pulmonology in two weeks.  3. Home health R.N. has been arranged for the patient. 4. We will also give him a prescription for a nebulizer.    TIME SPENT ON DISCHARGE:  60 minutes.   ____________________________ Mena Pauls, MD ag:bjt D: 11/10/2011 14:41:31 ET T: 11/10/2011 15:31:34 ET JOB#: [id number]  cc: Mena Pauls, MD, <Dictator> Salome Holmes, MD Mena Pauls MD ELECTRONICALLY SIGNED 11/17/2011 18:01

## 2014-11-30 NOTE — Discharge Summary (Signed)
PATIENT NAME:  Montminy, Tyeler MR#:  [id number] DATE OF BIRTH:  [birth year]  DATE OF ADMISSION:  11/02/2011 DATE OF DISCHARGE:  11/10/2011  ADDENDUM: The patient gets Risperdal injections every two weeks by ACT Team. Please make sure that he gets a Risperdal injection by the ACT Team.  ____________________________ Mena Pauls, MD ag:slb D: 11/10/2011 14:51:52 ET T: 11/10/2011 14:59:37 ET JOB#: [id number]  cc: Mena Pauls, MD, <Dictator> Salome Holmes, MD [place] MD ELECTRONICALLY SIGNED 11/17/2011 17:56

## 2014-11-30 NOTE — H&P (Signed)
PATIENT NAME:  [name], [name] MR#:  [id number] DATE OF BIRTH:  July 01, 1961  DATE OF ADMISSION:  11/02/2011  PRIMARY CARE PHYSICIAN: Salome Holmes, MD at [place]: Shortness of breath. The patient was brought into the Emergency Room with respiratory distress.   HISTORY OF PRESENT ILLNESS: The patient is a 54 year old male who lives at a group home, [facility], with a history of asthma, chronic obstructive pulmonary disease, hypertension, and schizoaffective disorder. He also smokes about 1/2 pack per day. He says he started having shortness of breath yesterday. It hit him yesterday. He was coughing with a dry cough. He is also having some runny nose and cold symptoms, and then he started having wheezing and increasing shortness of breath. When he was brought in by EMS he was in respiratory distress. He was saturating 90% on room air at that time with a respiratory rate of 42 and a heart rate of 124. He received 125 mg of IV Solu-Medrol by EMS, as per the nurse. He denies any chest pain. He denies any pleuritic chest pain. He says his head hurts because of the coughing. He is complaining of mild abdominal pain also. His initial work-up in the Emergency Room showed that his ABG showed hypoxia with a pO2 of 57 on 28% FiO2. He had a CT of the chest done that did not show any pulmonary embolism but questionable opacities in the posterior lower lobe on the right side concerning for infectious process. He said he was also running a fever yesterday . He denies any chills. He got continuous multiple breathing treatments in the Emergency Room. He is being admitted for acute hypoxic respiratory failure, asthma, chronic obstructive pulmonary disease exacerbation, possible pneumonia and rhabdomyolysis.   REVIEW OF SYSTEMS: CONSTITUTIONAL: He is complaining of fever. No weakness. HEENT: No acute change in vision. Complaining of mild headache. No dizziness. RESPIRATORY: He is complaining of cough,  dyspnea, chronic obstructive pulmonary disease, but no pleuritic chest pain. No hemoptysis. CARDIOVASCULAR: No palpitations, no syncope. GASTROINTESTINAL: No nausea, vomiting, diarrhea. Complaining of mild abdominal pain diffusely, generalized, mainly because of the coughing. No GI bleed. GENITOURINARY: No dysuria. No frequency. ENDOCRINE: No thyroid problems. No rash. MUSCULOSKELETAL: No joint pains, no swelling. HEMATOLOGIC: No anemia. No bleeding from any site. NEUROPSYCHIATRIC: No anxiety. No focal numbness or weakness.    PAST MEDICAL HISTORY:  1. Asthma. 2. Chronic obstructive pulmonary disease. 3. Hypertension. 4. Schizoaffective disorder.  5. History of polysubstance abuse in the past.   PAST SURGICAL HISTORY:  1. Hernia repair.  2. He recently had a colonoscopy.   ALLERGIES TO MEDICATIONS: None.   HOME MEDICATIONS: According to the [facility], he is on: 1. Aggrenox 25/200 b.i.d.   2. Atenolol 50 mg daily. 3. Cardizem CD 120 mg daily.  4. Colace 100 mg b.i.d.   5. Hydrochlorothiazide 12.5 mg daily.  6. Lovastatin 40 mg daily.  7. MiraLax.  8. Potassium 10 mEq daily.  9. ProAir 2 puffs q.i.d. as needed.  10. Prozac 20 mg daily.  11. Risperdal 37.5 IM every 2 weeks.  12. Seroquel 200 mg at bedtime.  13. Trihexyphenidyl 2 mg b.i.d.  14. Tylenol 650 mg every 4 hours p.r.n.   SOCIAL HISTORY: He lives in a group home at [place] for the last four years. He smokes about 1/2 pack a day. He denies any alcohol or drug use.   FAMILY HISTORY: His mother had heart disease.   PHYSICAL EXAMINATION:  VITAL  SIGNS: When he initially presented to the Emergency Room, his temperature was 99.6, heart rate 124, respiratory rate 42, blood pressure 125/65, saturating 90% on room air. Currently his heart rate is 101, blood pressure 141/78. He is saturating 95% on 5 liters nasal cannula, respiratory rate of  33. He is in mild-to-moderate respiratory distress.  GENERAL: This is a middle-aged  [place] American male, well built. He is sitting upright. He has some audible wheezing going on, increased respiratory effort.   HEENT: Bilateral pupils are equal. Extraocular muscles are intact. No scleral icterus. No conjunctivitis. Oral mucosa is moist. No pallor.   NECK: No thyroid tenderness, enlargement or nodule. Neck is supple. No masses, nontender. No adenopathy. No JVD. No carotid bruit.   CHEST: He has significant wheezing bilaterally. He has increased respiratory effort, mild use of accessory muscles of respiration.   HEART: Heart sounds are regular, tachycardic. No murmur. Good peripheral pulses. No lower extremity edema.   ABDOMEN: Soft, nontender. Normal bowel sounds. No hepatosplenomegaly. No bruits. No masses.    RECTAL: Exam is deferred.   NEUROLOGIC: He is awake, alert, oriented to time, place, and person. Cranial nerves are intact. Moving all extremities against gravity.   EXTREMITIES: No cyanosis. No clubbing.   SKIN: No rash. No lesions.   LABORATORY, DIAGNOSTIC AND RADIOLOGICAL DATA:  White count 9.4, hemoglobin 12.7, platelet count 172,000.  BMP: Sodium 137, potassium 3.5, BUN 12, creatinine 1.12, bicarbonate is 20. AST is 39.  CK is 2388. Troponin is negative. BNP is essentially negative, 232.  ABG shows pH of 7.39, pCO2 of 33, pO2 of 57 on 28% FiO2.  His CT chest is negative for PE but shows mild opacities in the posterior lobes on the right, may be atelectasis versus infectious process. Mildly enlarged mediastinal lymph nodes.  Chest x-ray: Prominence of the right hilum and right infrahilar area.   IMPRESSION:  1. Acute hypoxic respiratory failure. 2. Asthma/Chronic obstructive pulmonary disease exacerbation. 3. Possible pneumonia. 4. Rhabdomyolysis.  5. Hypertension. 6. Schizoaffective disorder. 7. Tobacco abuse.   PLAN: A 54 year old male who has history of hypertension, schizoaffective disorder, asthma, chronic obstructive pulmonary disease, still  smoking 1/2 pack per day, presents with cough, shortness of breath and presented to the Emergency Room in respiratory distress. His CT of the chest is negative for PE but shows a questionable opacity in the right lower lobe. We are going to get blood cultures on him and start him on antibiotics with ceftriaxone and Zithromax ,  give him stat Solu-Medrol, start him on DuoNebs. We will also give him a dose of mag sulfate because he has so much bronchospasm and he is wheezing. He also has rhabdomyolysis with CPK of 2388. I am going to give him some IV hydration, hold his statin at this time, hold hydrochlorothiazide at this time. Considering his severe wheezing, I am going to hold his beta blocker at this time and increase his Cardizem to 180 mg a day. We will continue his psychiatric medications. He needs close respiratory monitoring. I will admit him to 2A.   TIME SPENT:   Time spent with admission and coordination of care was 55 minutes.    ____________________________ Mena Pauls, MD ag:cbb D: 11/02/2011 12:34:50 ET T: 11/02/2011 12:54:49 ET JOB#: [id number]  cc: Mena Pauls, MD, <Dictator> Salome Holmes, MD Mena Pauls MD ELECTRONICALLY SIGNED 12/02/2011 7:26

## 2014-12-07 NOTE — H&P (Signed)
PATIENT NAME:  Falck, JEIDEN DAUGHTRIDGE MR#:  [id number] DATE OF BIRTH:  June 16, 1961  DATE OF ADMISSION:  11/13/2014  CHIEF COMPLAINT: Umbilical hernia.   HISTORY OF PRESENT ILLNESS: This is a patient with several weeks of abdominal pain around an umbilical hernia which is partially reducible. He is here for elective repair.   PAST MEDICAL HISTORY: Hyperlipidemia, coronary artery disease, COPD, schizoaffective disorder, sleep apnea, tobacco abuse, anxiety, hypertension, and umbilical hernia.   PAST SURGICAL HISTORY: Inguinal hernia repair.   MEDICATIONS: Multiple, see reconciliation.   ALLERGIES: None.   SOCIAL HISTORY: The patient smokes tobacco. I have discussed cessation with him preoperatively. Alcohol use is moderate.   REVIEW OF SYSTEMS: A complete system review is documented in the office chart.   PHYSICAL EXAMINATION: GENERAL: Obese male patient in no acute distress.  HEENT: No scleral icterus.  NECK: No palpable neck nodes.  CHEST: Clear to auscultation.  CARDIAC: Regular rate and rhythm.  ABDOMEN: Soft and nontender. There is an umbilical hernia and a supraumbilical ventral hernia which is partially reducible.   EXTREMITIES: Without edema.  NEUROLOGIC: Grossly intact.  INTEGUMENT: No jaundice.   LABORATORY VALUES: Supplied in the chart.   ASSESSMENT AND PLAN: This is a patient with a partially incarcerated umbilical and supraumbilical ventral hernia. He is here for elective repair with mesh. The rationale for this has been discussed. The options of observation have been reviewed. The risks of bleeding, infection, recurrence, mesh placement, and mesh infection have all been discussed. He understood and agreed to proceed.    ____________________________ Jerrol Banana Burt Knack, MD rec:at D: 11/12/2014 15:06:52 ET T: 11/12/2014 15:40:29 ET JOB#: [id number]  cc: Jerrol Banana. Burt Knack, MD, <Dictator> Florene Glen MD ELECTRONICALLY SIGNED 11/12/2014 18:27

## 2014-12-12 ENCOUNTER — Inpatient Hospital Stay: Admission: RE | Admit: 2014-12-12 | Payer: Self-pay | Source: Ambulatory Visit

## 2014-12-12 NOTE — OR Nursing (Signed)
Patient was seen in pre-admit testing recently for another assessment.  Reviewed medications, allergies, and medical history with caregiver.  Preoperative instructions provided.  Renee verbalized understanding.

## 2014-12-18 ENCOUNTER — Encounter: Payer: Self-pay | Admitting: *Deleted

## 2014-12-18 ENCOUNTER — Ambulatory Visit: Payer: Medicare Other | Admitting: Anesthesiology

## 2014-12-18 ENCOUNTER — Encounter
Admission: RE | Disposition: A | Discharge status: Other Acute Inpatient Hospital | Payer: Self-pay | Source: Ambulatory Visit | Attending: Internal Medicine

## 2014-12-18 ENCOUNTER — Observation Stay: Payer: Medicare Other

## 2014-12-18 ENCOUNTER — Inpatient Hospital Stay
Admission: RE | Admit: 2014-12-18 | Discharge: 2014-12-24 | DRG: 353 | Payer: Medicare Other | Source: Ambulatory Visit | Attending: Surgery | Admitting: Surgery

## 2014-12-18 DIAGNOSIS — R0602 Shortness of breath: Secondary | ICD-10-CM

## 2014-12-18 DIAGNOSIS — E785 Hyperlipidemia, unspecified: Secondary | ICD-10-CM | POA: Diagnosis present

## 2014-12-18 DIAGNOSIS — G4730 Sleep apnea, unspecified: Secondary | ICD-10-CM | POA: Diagnosis present

## 2014-12-18 DIAGNOSIS — K439 Ventral hernia without obstruction or gangrene: Secondary | ICD-10-CM | POA: Diagnosis present

## 2014-12-18 DIAGNOSIS — F329 Major depressive disorder, single episode, unspecified: Secondary | ICD-10-CM | POA: Diagnosis present

## 2014-12-18 DIAGNOSIS — Z8249 Family history of ischemic heart disease and other diseases of the circulatory system: Secondary | ICD-10-CM

## 2014-12-18 DIAGNOSIS — F17210 Nicotine dependence, cigarettes, uncomplicated: Secondary | ICD-10-CM | POA: Diagnosis present

## 2014-12-18 DIAGNOSIS — K429 Umbilical hernia without obstruction or gangrene: Secondary | ICD-10-CM | POA: Diagnosis present

## 2014-12-18 DIAGNOSIS — Z8673 Personal history of transient ischemic attack (TIA), and cerebral infarction without residual deficits: Secondary | ICD-10-CM

## 2014-12-18 DIAGNOSIS — E669 Obesity, unspecified: Secondary | ICD-10-CM | POA: Diagnosis present

## 2014-12-18 DIAGNOSIS — F209 Schizophrenia, unspecified: Secondary | ICD-10-CM | POA: Diagnosis present

## 2014-12-18 DIAGNOSIS — I2510 Atherosclerotic heart disease of native coronary artery without angina pectoris: Secondary | ICD-10-CM | POA: Diagnosis present

## 2014-12-18 DIAGNOSIS — Z6833 Body mass index (BMI) 33.0-33.9, adult: Secondary | ICD-10-CM | POA: Diagnosis not present

## 2014-12-18 DIAGNOSIS — I10 Essential (primary) hypertension: Secondary | ICD-10-CM | POA: Diagnosis present

## 2014-12-18 DIAGNOSIS — J95821 Acute postprocedural respiratory failure: Secondary | ICD-10-CM | POA: Diagnosis not present

## 2014-12-18 DIAGNOSIS — J441 Chronic obstructive pulmonary disease with (acute) exacerbation: Secondary | ICD-10-CM | POA: Diagnosis present

## 2014-12-18 DIAGNOSIS — K436 Other and unspecified ventral hernia with obstruction, without gangrene: Secondary | ICD-10-CM | POA: Diagnosis present

## 2014-12-18 DIAGNOSIS — R7981 Abnormal blood-gas level: Secondary | ICD-10-CM

## 2014-12-18 HISTORY — DX: Pneumonia, unspecified organism: J18.9

## 2014-12-18 HISTORY — DX: Schizophrenia, unspecified: F20.9

## 2014-12-18 HISTORY — DX: Essential (primary) hypertension: I10

## 2014-12-18 HISTORY — DX: Personal history of other diseases of the digestive system: Z87.19

## 2014-12-18 HISTORY — PX: UMBILICAL HERNIA REPAIR: SHX196

## 2014-12-18 HISTORY — DX: Chronic obstructive pulmonary disease, unspecified: J44.9

## 2014-12-18 HISTORY — PX: INSERTION OF MESH: SHX5868

## 2014-12-18 HISTORY — DX: Major depressive disorder, single episode, unspecified: F32.9

## 2014-12-18 HISTORY — DX: Cerebral infarction, unspecified: I63.9

## 2014-12-18 HISTORY — DX: Reserved for inherently not codable concepts without codable children: IMO0001

## 2014-12-18 HISTORY — PX: SUPRA-UMBILICAL HERNIA: SHX6105

## 2014-12-18 HISTORY — DX: Depression, unspecified: F32.A

## 2014-12-18 LAB — CBC WITH DIFFERENTIAL/PLATELET
Basophils Absolute: 0 10*3/uL (ref 0–0.1)
Basophils Relative: 0 %
EOS PCT: 4 %
Eosinophils Absolute: 0.4 10*3/uL (ref 0–0.7)
HEMATOCRIT: 40.5 % (ref 40.0–52.0)
HEMOGLOBIN: 13.7 g/dL (ref 13.0–18.0)
LYMPHS ABS: 2.9 10*3/uL (ref 1.0–3.6)
LYMPHS PCT: 30 %
MCH: 31.5 pg (ref 26.0–34.0)
MCHC: 33.9 g/dL (ref 32.0–36.0)
MCV: 92.9 fL (ref 80.0–100.0)
MONO ABS: 0.6 10*3/uL (ref 0.2–1.0)
Monocytes Relative: 7 %
Neutro Abs: 5.9 10*3/uL (ref 1.4–6.5)
Neutrophils Relative %: 59 %
Platelets: 202 10*3/uL (ref 150–440)
RBC: 4.35 MIL/uL — ABNORMAL LOW (ref 4.40–5.90)
RDW: 15.2 % — AB (ref 11.5–14.5)
WBC: 9.9 10*3/uL (ref 3.8–10.6)

## 2014-12-18 LAB — HEPATIC FUNCTION PANEL
ALT: 22 U/L (ref 17–63)
AST: 24 U/L (ref 15–41)
Albumin: 3.8 g/dL (ref 3.5–5.0)
Alkaline Phosphatase: 73 U/L (ref 38–126)
Total Bilirubin: 0.4 mg/dL (ref 0.3–1.2)
Total Protein: 7.1 g/dL (ref 6.5–8.1)

## 2014-12-18 LAB — BASIC METABOLIC PANEL
Anion gap: 6 (ref 5–15)
BUN: 6 mg/dL (ref 6–20)
CALCIUM: 9.3 mg/dL (ref 8.9–10.3)
CHLORIDE: 103 mmol/L (ref 101–111)
CO2: 30 mmol/L (ref 22–32)
CREATININE: 0.7 mg/dL (ref 0.61–1.24)
GFR calc Af Amer: >60 mL/min (ref >60)
GFR calc non Af Amer: >60 mL/min (ref >60)
GLUCOSE: 124 mg/dL — AB (ref 65–99)
Potassium: 3.8 mmol/L (ref 3.5–5.1)
Sodium: 139 mmol/L (ref 135–145)

## 2014-12-18 SURGERY — REPAIR, HERNIA, UMBILICAL, ADULT
Anesthesia: General

## 2014-12-18 MED ORDER — MORPHINE SULFATE 4 MG/ML IJ SOLN
4.0000 mg | INTRAMUSCULAR | Status: DC | PRN
  Administered 2014-12-18 – 2014-12-19 (×3): 4 mg via INTRAVENOUS
  Filled 2014-12-18 (×3): qty 1

## 2014-12-18 MED ORDER — LABETALOL HCL 5 MG/ML IV SOLN
INTRAVENOUS | Status: DC | PRN
  Administered 2014-12-18 (×2): 10 mg via INTRAVENOUS

## 2014-12-18 MED ORDER — PROPOFOL 10 MG/ML IV BOLUS
INTRAVENOUS | Status: DC | PRN
  Administered 2014-12-18: 150 mg via INTRAVENOUS

## 2014-12-18 MED ORDER — LACTATED RINGERS IV SOLN
INTRAVENOUS | Status: DC
  Administered 2014-12-18 (×3): via INTRAVENOUS

## 2014-12-18 MED ORDER — FLUOXETINE HCL 20 MG PO CAPS
20.0000 mg | ORAL_CAPSULE | Freq: Every day | ORAL | Status: DC
  Administered 2014-12-19 – 2014-12-24 (×6): 20 mg via ORAL
  Filled 2014-12-18 (×6): qty 1

## 2014-12-18 MED ORDER — NALOXONE HCL 0.4 MG/ML IJ SOLN: INTRAMUSCULAR | Status: DC | PRN

## 2014-12-18 MED ORDER — TIOTROPIUM BROMIDE MONOHYDRATE 18 MCG IN CAPS
18.0000 ug | ORAL_CAPSULE | Freq: Every day | RESPIRATORY_TRACT | Status: DC
  Administered 2014-12-19 – 2014-12-24 (×6): 18 ug via RESPIRATORY_TRACT
  Filled 2014-12-18 (×2): qty 5

## 2014-12-18 MED ORDER — ASPIRIN EC 81 MG PO TBEC
81.0000 mg | DELAYED_RELEASE_TABLET | Freq: Every day | ORAL | Status: DC
  Administered 2014-12-19 – 2014-12-24 (×6): 81 mg via ORAL
  Filled 2014-12-18 (×12): qty 1

## 2014-12-18 MED ORDER — NEOSTIGMINE METHYLSULFATE 10 MG/10ML IV SOLN
INTRAVENOUS | Status: DC | PRN
  Administered 2014-12-18: 5 mg via INTRAVENOUS

## 2014-12-18 MED ORDER — PRAVASTATIN SODIUM 20 MG PO TABS
20.0000 mg | ORAL_TABLET | Freq: Every day | ORAL | Status: DC
  Administered 2014-12-18 – 2014-12-23 (×6): 20 mg via ORAL
  Filled 2014-12-18 (×6): qty 1

## 2014-12-18 MED ORDER — FUROSEMIDE 10 MG/ML IJ SOLN
20.0000 mg | Freq: Once | INTRAMUSCULAR | Status: AC
  Administered 2014-12-18: 20 mg via INTRAVENOUS

## 2014-12-18 MED ORDER — ALBUTEROL SULFATE HFA 108 (90 BASE) MCG/ACT IN AERS: 2.0000 | INHALATION_SPRAY | RESPIRATORY_TRACT | Status: DC | PRN

## 2014-12-18 MED ORDER — BUPIVACAINE-EPINEPHRINE (PF) 0.25% -1:200000 IJ SOLN
INTRAMUSCULAR | Status: DC | PRN
  Administered 2014-12-18: 30 mL via PERINEURAL

## 2014-12-18 MED ORDER — POTASSIUM CHLORIDE CRYS ER 20 MEQ PO TBCR
20.0000 meq | EXTENDED_RELEASE_TABLET | Freq: Once | ORAL | Status: DC
Start: 2014-12-18 — End: 2014-12-24

## 2014-12-18 MED ORDER — ATENOLOL 50 MG PO TABS
50.0000 mg | ORAL_TABLET | Freq: Every day | ORAL | Status: DC
  Administered 2014-12-19 – 2014-12-24 (×6): 50 mg via ORAL
  Filled 2014-12-18 (×6): qty 1

## 2014-12-18 MED ORDER — GLYCOPYRROLATE 0.2 MG/ML IJ SOLN
INTRAMUSCULAR | Status: DC | PRN
  Administered 2014-12-18: .8 mg via INTRAVENOUS

## 2014-12-18 MED ORDER — FUROSEMIDE 10 MG/ML IJ SOLN
INTRAMUSCULAR | Status: AC
  Filled 2014-12-18: qty 2

## 2014-12-18 MED ORDER — NALOXONE HCL 0.4 MG/ML IJ SOLN
INTRAMUSCULAR | Status: DC | PRN
  Administered 2014-12-18: 0.1 mg via INTRAVENOUS

## 2014-12-18 MED ORDER — ONDANSETRON HCL 4 MG/2ML IJ SOLN
INTRAMUSCULAR | Status: DC | PRN
  Administered 2014-12-18: 4 mg via INTRAVENOUS

## 2014-12-18 MED ORDER — SUCCINYLCHOLINE CHLORIDE 20 MG/ML IJ SOLN
INTRAMUSCULAR | Status: DC | PRN
  Administered 2014-12-18: 160 mg via INTRAVENOUS

## 2014-12-18 MED ORDER — ONDANSETRON HCL 4 MG/2ML IJ SOLN: 4.0000 mg | Freq: Once | INTRAMUSCULAR | Status: DC | PRN

## 2014-12-18 MED ORDER — CLOPIDOGREL BISULFATE 75 MG PO TABS
75.0000 mg | ORAL_TABLET | Freq: Every day | ORAL | Status: DC
  Administered 2014-12-19 – 2014-12-24 (×6): 75 mg via ORAL
  Filled 2014-12-18 (×6): qty 1

## 2014-12-18 MED ORDER — BUDESONIDE-FORMOTEROL FUMARATE 160-4.5 MCG/ACT IN AERO
2.0000 | INHALATION_SPRAY | Freq: Two times a day (BID) | RESPIRATORY_TRACT | Status: DC
  Administered 2014-12-18 – 2014-12-24 (×12): 2 via RESPIRATORY_TRACT
  Filled 2014-12-18: qty 6

## 2014-12-18 MED ORDER — FLUMAZENIL 0.5 MG/5ML IV SOLN: INTRAVENOUS | Status: DC | PRN

## 2014-12-18 MED ORDER — MIDAZOLAM HCL 2 MG/2ML IJ SOLN
INTRAMUSCULAR | Status: DC | PRN
  Administered 2014-12-18: 2 mg via INTRAVENOUS

## 2014-12-18 MED ORDER — HYDROCHLOROTHIAZIDE 25 MG PO TABS
12.5000 mg | ORAL_TABLET | Freq: Every day | ORAL | Status: DC
  Administered 2014-12-19: 10:00:00 via ORAL
  Administered 2014-12-20 – 2014-12-24 (×5): 12.5 mg via ORAL
  Filled 2014-12-18: qty 1
  Filled 2014-12-18: qty 2
  Filled 2014-12-18: qty 1
  Filled 2014-12-18: qty 2
  Filled 2014-12-18 (×2): qty 1

## 2014-12-18 MED ORDER — FENTANYL CITRATE (PF) 100 MCG/2ML IJ SOLN
25.0000 ug | INTRAMUSCULAR | Status: AC | PRN
  Administered 2014-12-18 (×6): 25 ug via INTRAVENOUS

## 2014-12-18 MED ORDER — FUROSEMIDE 10 MG/ML IJ SOLN
INTRAMUSCULAR | Status: DC | PRN
  Administered 2014-12-18: 10 mg via INTRAMUSCULAR

## 2014-12-18 MED ORDER — FENTANYL CITRATE (PF) 100 MCG/2ML IJ SOLN
INTRAMUSCULAR | Status: DC | PRN
  Administered 2014-12-18 (×2): 100 ug via INTRAVENOUS
  Administered 2014-12-18: 50 ug via INTRAVENOUS

## 2014-12-18 MED ORDER — FLUMAZENIL 0.5 MG/5ML IV SOLN
INTRAVENOUS | Status: DC | PRN
  Administered 2014-12-18: 0.2 mg via INTRAVENOUS

## 2014-12-18 MED ORDER — RISPERIDONE MICROSPHERES 37.5 MG IM SUSR
37.5000 mg | INTRAMUSCULAR | Status: DC
  Filled 2014-12-18: qty 2

## 2014-12-18 MED ORDER — PANTOPRAZOLE SODIUM 40 MG PO TBEC
40.0000 mg | DELAYED_RELEASE_TABLET | Freq: Every day | ORAL | Status: DC
  Administered 2014-12-18 – 2014-12-23 (×6): 40 mg via ORAL
  Filled 2014-12-18 (×7): qty 1

## 2014-12-18 MED ORDER — METHYLPREDNISOLONE SODIUM SUCC 125 MG IJ SOLR
INTRAMUSCULAR | Status: AC
  Filled 2014-12-18: qty 2

## 2014-12-18 MED ORDER — ONDANSETRON HCL 4 MG PO TABS: 4.0000 mg | ORAL_TABLET | Freq: Four times a day (QID) | ORAL | Status: DC | PRN

## 2014-12-18 MED ORDER — IPRATROPIUM-ALBUTEROL 0.5-2.5 (3) MG/3ML IN SOLN
3.0000 mL | RESPIRATORY_TRACT | Status: DC | PRN
  Administered 2014-12-18: 3 mL via RESPIRATORY_TRACT

## 2014-12-18 MED ORDER — METHYLPREDNISOLONE SODIUM SUCC 125 MG IJ SOLR
125.0000 mg | Freq: Once | INTRAMUSCULAR | Status: AC
  Administered 2014-12-18: 125 mg via INTRAVENOUS

## 2014-12-18 MED ORDER — FENTANYL CITRATE (PF) 100 MCG/2ML IJ SOLN
INTRAMUSCULAR | Status: AC
  Administered 2014-12-18: 25 ug via INTRAVENOUS
  Filled 2014-12-18: qty 2

## 2014-12-18 MED ORDER — LIDOCAINE HCL (CARDIAC) 20 MG/ML IV SOLN
INTRAVENOUS | Status: DC | PRN
  Administered 2014-12-18: 40 mg via INTRAVENOUS

## 2014-12-18 MED ORDER — QUETIAPINE FUMARATE 200 MG PO TABS
200.0000 mg | ORAL_TABLET | Freq: Every day | ORAL | Status: DC
  Administered 2014-12-18 – 2014-12-23 (×6): 200 mg via ORAL
  Filled 2014-12-18 (×8): qty 1

## 2014-12-18 MED ORDER — IPRATROPIUM-ALBUTEROL 0.5-2.5 (3) MG/3ML IN SOLN
RESPIRATORY_TRACT | Status: AC
  Administered 2014-12-18: 3 mL via RESPIRATORY_TRACT
  Filled 2014-12-18: qty 3

## 2014-12-18 MED ORDER — HYDROMORPHONE HCL 1 MG/ML IJ SOLN
INTRAMUSCULAR | Status: AC
  Administered 2014-12-18: 0.5 mg via INTRAVENOUS
  Filled 2014-12-18: qty 1

## 2014-12-18 MED ORDER — CEFAZOLIN SODIUM-DEXTROSE 2-3 GM-% IV SOLR
INTRAVENOUS | Status: AC
  Filled 2014-12-18: qty 50

## 2014-12-18 MED ORDER — DOCUSATE SODIUM 100 MG PO CAPS: 100.0000 mg | ORAL_CAPSULE | Freq: Two times a day (BID) | ORAL | Status: DC | PRN

## 2014-12-18 MED ORDER — GUAIFENESIN 100 MG/5ML PO SOLN: 200.0000 mg | ORAL | Status: DC | PRN

## 2014-12-18 MED ORDER — ALBUTEROL SULFATE (2.5 MG/3ML) 0.083% IN NEBU: 2.5000 mg | INHALATION_SOLUTION | RESPIRATORY_TRACT | Status: DC | PRN

## 2014-12-18 MED ORDER — LEVOFLOXACIN IN D5W 500 MG/100ML IV SOLN
500.0000 mg | INTRAVENOUS | Status: DC
Start: 2014-12-18 — End: 2014-12-19
  Administered 2014-12-18: 500 mg via INTRAVENOUS
  Filled 2014-12-18 (×2): qty 100

## 2014-12-18 MED ORDER — BUPIVACAINE-EPINEPHRINE (PF) 0.25% -1:200000 IJ SOLN
INTRAMUSCULAR | Status: AC
  Filled 2014-12-18: qty 30

## 2014-12-18 MED ORDER — IPRATROPIUM-ALBUTEROL 0.5-2.5 (3) MG/3ML IN SOLN: 3.0000 mL | RESPIRATORY_TRACT | Status: DC

## 2014-12-18 MED ORDER — ROCURONIUM BROMIDE 100 MG/10ML IV SOLN
INTRAVENOUS | Status: DC | PRN
  Administered 2014-12-18: 35 mg via INTRAVENOUS
  Administered 2014-12-18: 10 mg via INTRAVENOUS
  Administered 2014-12-18: 5 mg via INTRAVENOUS

## 2014-12-18 MED ORDER — ONDANSETRON HCL 4 MG/2ML IJ SOLN: 4.0000 mg | Freq: Four times a day (QID) | INTRAMUSCULAR | Status: DC | PRN

## 2014-12-18 MED ORDER — CEFAZOLIN SODIUM-DEXTROSE 2-3 GM-% IV SOLR
2.0000 g | INTRAVENOUS | Status: AC
  Administered 2014-12-18: 2 g via INTRAVENOUS

## 2014-12-18 MED ORDER — HEPARIN SODIUM (PORCINE) 5000 UNIT/ML IJ SOLN
5000.0000 [IU] | Freq: Once | INTRAMUSCULAR | Status: AC
  Administered 2014-12-18: 5000 [IU] via SUBCUTANEOUS

## 2014-12-18 MED ORDER — IPRATROPIUM-ALBUTEROL 0.5-2.5 (3) MG/3ML IN SOLN
3.0000 mL | RESPIRATORY_TRACT | Status: DC
  Administered 2014-12-18 – 2014-12-19 (×7): 3 mL via RESPIRATORY_TRACT
  Filled 2014-12-18 (×6): qty 3

## 2014-12-18 MED ORDER — NICOTINE 10 MG IN INHA
1.0000 | RESPIRATORY_TRACT | Status: DC | PRN
  Filled 2014-12-18: qty 36

## 2014-12-18 MED ORDER — IPRATROPIUM-ALBUTEROL 0.5-2.5 (3) MG/3ML IN SOLN
RESPIRATORY_TRACT | Status: AC
  Administered 2014-12-18: 3 mL
  Filled 2014-12-18: qty 3

## 2014-12-18 MED ORDER — ACETAMINOPHEN 325 MG PO TABS: 650.0000 mg | ORAL_TABLET | Freq: Two times a day (BID) | ORAL | Status: DC | PRN

## 2014-12-18 MED ORDER — HEPARIN SODIUM (PORCINE) 5000 UNIT/ML IJ SOLN
INTRAMUSCULAR | Status: AC
  Filled 2014-12-18: qty 1

## 2014-12-18 MED ORDER — DILTIAZEM HCL ER COATED BEADS 180 MG PO CP24
180.0000 mg | ORAL_CAPSULE | Freq: Every day | ORAL | Status: DC
  Administered 2014-12-19 – 2014-12-24 (×6): 180 mg via ORAL
  Filled 2014-12-18 (×6): qty 1

## 2014-12-18 MED ORDER — GUAIFENESIN-DM 100-10 MG/5ML PO SYRP: 5.0000 mL | ORAL_SOLUTION | ORAL | Status: DC | PRN

## 2014-12-18 MED ORDER — VITAMIN D3 25 MCG (1000 UNIT) PO TABS
1000.0000 [IU] | ORAL_TABLET | Freq: Every day | ORAL | Status: DC
  Administered 2014-12-19 – 2014-12-24 (×6): 1000 [IU] via ORAL
  Filled 2014-12-18 (×12): qty 1

## 2014-12-18 MED ORDER — OXYCODONE-ACETAMINOPHEN 5-325 MG PO TABS
1.0000 | ORAL_TABLET | ORAL | Status: DC | PRN
  Administered 2014-12-18 – 2014-12-24 (×18): 1 via ORAL
  Filled 2014-12-18 (×18): qty 1

## 2014-12-18 MED ORDER — TRIHEXYPHENIDYL HCL 2 MG PO TABS
2.0000 mg | ORAL_TABLET | Freq: Two times a day (BID) | ORAL | Status: DC
  Administered 2014-12-18 – 2014-12-24 (×12): 2 mg via ORAL
  Filled 2014-12-18 (×12): qty 1

## 2014-12-18 MED ORDER — PROMETHAZINE HCL 25 MG PO TABS: 25.0000 mg | ORAL_TABLET | Freq: Four times a day (QID) | ORAL | Status: DC | PRN

## 2014-12-18 MED ORDER — HEPARIN SODIUM (PORCINE) 5000 UNIT/ML IJ SOLN
5000.0000 [IU] | Freq: Three times a day (TID) | INTRAMUSCULAR | Status: DC
  Administered 2014-12-18 – 2014-12-24 (×16): 5000 [IU] via SUBCUTANEOUS
  Filled 2014-12-18 (×17): qty 1

## 2014-12-18 MED ORDER — METHYLPREDNISOLONE SODIUM SUCC 125 MG IJ SOLR
60.0000 mg | Freq: Four times a day (QID) | INTRAMUSCULAR | Status: DC
  Administered 2014-12-18 – 2014-12-19 (×3): 60 mg via INTRAVENOUS
  Filled 2014-12-18 (×3): qty 2

## 2014-12-18 MED ORDER — HYDROMORPHONE HCL 1 MG/ML IJ SOLN
0.5000 mg | INTRAMUSCULAR | Status: DC | PRN
  Administered 2014-12-18 (×2): 0.5 mg via INTRAVENOUS

## 2014-12-18 SURGICAL SUPPLY — 36 items
ADH LQ OCL WTPRF AMP STRL LF (MISCELLANEOUS) ×2
ADHESIVE MASTISOL STRL (MISCELLANEOUS) ×4 IMPLANT
CANISTER SUCT 1200ML W/VALVE (MISCELLANEOUS) ×4 IMPLANT
CLOSURE WOUND 1/2 X4 (GAUZE/BANDAGES/DRESSINGS) ×1
DRAPE LAPAROTOMY 100X77 ABD (DRAPES) ×4 IMPLANT
DRSG TELFA 3X8 NADH (GAUZE/BANDAGES/DRESSINGS) ×4 IMPLANT
GAUZE SPONGE 4X4 12PLY STRL (GAUZE/BANDAGES/DRESSINGS) ×4 IMPLANT
GLOVE BIO SURGEON STRL SZ 6.5 (GLOVE) ×2 IMPLANT
GLOVE BIO SURGEON STRL SZ8 (GLOVE) ×8 IMPLANT
GLOVE BIO SURGEONS STRL SZ 6.5 (GLOVE) ×2
GLOVE BIOGEL PI IND STRL 6.5 (GLOVE) IMPLANT
GLOVE BIOGEL PI INDICATOR 6.5 (GLOVE) ×4
GOWN STRL REUS W/ TWL LRG LVL3 (GOWN DISPOSABLE) ×4 IMPLANT
GOWN STRL REUS W/TWL LRG LVL3 (GOWN DISPOSABLE) ×12
LABEL OR SOLS (LABEL) ×2 IMPLANT
MESH VENT ST 11X14CM M OVL (Mesh General) ×2 IMPLANT
NDL SAFETY 22GX1.5 (NEEDLE) ×4 IMPLANT
NS IRRIG 500ML POUR BTL (IV SOLUTION) ×4 IMPLANT
PACK BASIN MINOR ARMC (MISCELLANEOUS) ×4 IMPLANT
PAD DRESSING TELFA 3X8 NADH (GAUZE/BANDAGES/DRESSINGS) IMPLANT
PAD GROUND ADULT SPLIT (MISCELLANEOUS) ×4 IMPLANT
SPONGE LAP 18X18 5 PK (GAUZE/BANDAGES/DRESSINGS) ×4 IMPLANT
STAPLER SKIN PROX 35W (STAPLE) ×2 IMPLANT
STRIP CLOSURE SKIN 1/2X4 (GAUZE/BANDAGES/DRESSINGS) ×3 IMPLANT
SUT ETHIBOND 0 (SUTURE) ×2 IMPLANT
SUT ETHIBOND NAB CT1 #1 30IN (SUTURE) ×2 IMPLANT
SUT MNCRL 4-0 (SUTURE) ×4
SUT MNCRL 4-0 27XMFL (SUTURE) ×2
SUT PROLENE 0 CT 1 30 (SUTURE) ×12 IMPLANT
SUT PROLENE 0 CT 1 CR/8 (SUTURE) ×2 IMPLANT
SUT PROLENE 1 CT 1 30 (SUTURE) ×10 IMPLANT
SUT VIC AB 0 CT1 36 (SUTURE) ×2 IMPLANT
SUT VIC AB 3-0 SH 27 (SUTURE) ×4
SUT VIC AB 3-0 SH 27X BRD (SUTURE) ×2 IMPLANT
SUTURE MNCRL 4-0 27XMF (SUTURE) ×2 IMPLANT
SYRINGE 10CC LL (SYRINGE) ×4 IMPLANT

## 2014-12-18 NOTE — Anesthesia Preprocedure Evaluation (Signed)
Anesthesia Evaluation  Patient identified by MRN, date of birth, ID band Patient awake    Reviewed: Allergy & Precautions, NPO status , reviewed documented beta blocker date and time   Airway Mallampati: III  TM Distance: >3 FB Neck ROM: Full    Dental  (+) Lower Dentures, Upper Dentures   Pulmonary shortness of breath and with exertion, pneumonia -, Current Smoker,  breath sounds clear to auscultation  Pulmonary exam normal       Cardiovascular hypertension, Rhythm:Regular Rate:Normal  Possible CAD   Neuro/Psych Depression Schizophrenia CVA    GI/Hepatic Neg liver ROS, hiatal hernia, GERD-  Medicated and Controlled,  Endo/Other    Renal/GU negative Renal ROS  negative genitourinary   Musculoskeletal   Abdominal (+) + obese,   Peds negative pediatric ROS (+)  Hematology negative hematology ROS (+)   Anesthesia Other Findings   Reproductive/Obstetrics                             Anesthesia Physical Anesthesia Plan  ASA: III  Anesthesia Plan: General   Post-op Pain Management:    Induction: Intravenous, Rapid sequence and Cricoid pressure planned  Airway Management Planned: Oral ETT  Additional Equipment:   Intra-op Plan:   Post-operative Plan: Extubation in OR  Informed Consent: I have reviewed the patients History and Physical, chart, labs and discussed the procedure including the risks, benefits and alternatives for the proposed anesthesia with the patient or authorized representative who has indicated his/her understanding and acceptance.   Dental advisory given  Plan Discussed with: CRNA and Surgeon  Anesthesia Plan Comments:         Anesthesia Quick Evaluation

## 2014-12-18 NOTE — Progress Notes (Signed)
Preoperative Review   Patient is met in the preoperative holding area. The history is reviewed in the chart and with the patient. I personally reviewed the options and rationale as well as the risks of this procedure that have been previously discussed with the patient. All questions asked by the patient and/or family were answered to their satisfaction.  Patient agrees to proceed with this procedure at this time.  Merril Isakson E Juliya Magill M.D. FACS  

## 2014-12-18 NOTE — Anesthesia Postprocedure Evaluation (Signed)
  Anesthesia Post-op Note  Patient: [name]  Procedure(s) Performed: Procedure(s): HERNIA REPAIR UMBILICAL ADULT (N/A) INSERTION OF MESH (N/A) SUPRA-UMBILICAL HERNIA  Anesthesia type:General  Patient location: PACU  Post pain: Pain level controlled  Post assessment: Post-op Vital signs reviewed, Patient's Cardiovascular Status Stable, Respiratory Function Stable, Patent Airway and No signs of Nausea or vomiting  Post vital signs: Reviewed and stable  Last Vitals:  Filed Vitals:   12/18/14 1223  BP: 131/91  Pulse: 88  Temp:   Resp: 24    Level of consciousness: awake, alert  and patient cooperative  Complications: No apparent anesthesia complications

## 2014-12-18 NOTE — H&P (Signed)
[name] is an 53 y.o. male.   Chief Complaint: Umbilical hernia HPI: This is a patient with fairly severe COPD who presents with periumbilical abdominal pain and mass. A workup has confirmed the findings of a umbilical hernia. He was here last month for repair but this was canceled by anesthesia due to his pulmonary condition area is subsequently seen Dr. Raul Del and has had pulmonary clearance and optimization.  No past medical history on file. past medical history includes hyperlipidemia coronary artery disease COPD schizoaffective disorder and sleep apnea tobacco abuse ongoing anxiety and hypertension and umbilical hernia  No past surgical history on file. Past surgical history prior hernia repair at [facility] No family history on file. noncontributory Social History:  has no tobacco, alcohol, and drug history on file. patient continues to smoke he is disabled uses moderate alcohol  Allergies: No Known Allergies  No prescriptions prior to admission     Review of Systems:   Review of Systems  Constitutional: Negative for fever and chills.  Eyes: Negative.   Respiratory: Positive for shortness of breath and wheezing. Negative for cough.   Cardiovascular: Positive for leg swelling. Negative for chest pain.  Gastrointestinal: Positive for abdominal pain. Negative for heartburn, diarrhea and constipation.  Genitourinary: Negative for dysuria.  Musculoskeletal: Negative.   Skin: Negative for rash.  Neurological: Negative.  Negative for headaches.  Endo/Heme/Allergies: Negative.   Psychiatric/Behavioral: Positive for depression. The patient is nervous/anxious.     Physical Exam:  Physical Exam  Constitutional: He is oriented to person, place, and time and well-developed, well-nourished, and in no distress.  HENT:  Head: Normocephalic.  Eyes: Scleral icterus is present.  Neck: Normal range of motion. Neck supple.  Cardiovascular: Normal rate and regular rhythm.    Pulmonary/Chest: No respiratory distress. He has wheezes. He has no rales.  Abdominal: Soft. He exhibits no distension and no mass. There is no tenderness. There is no rebound and no guarding.  Periumbilical and supraumbilical ventral hernias partially reducible  Musculoskeletal: Normal range of motion.  Neurological: He is alert and oriented to person, place, and time.  Skin: Skin is warm and dry.    Height '6\' 1"'$  (1.854 m).    No results found for this or any previous visit (from the past 48 hour(s)). No results found.   Assessment/Plan This is a patient with supraumbilical and periumbilical ventral hernias. This was repaired once before at [facility]. He was scheduled for surgery last month and was canceled by anesthesia due to his severe COPD he is subsequently seen Dr. Raul Del and been cleared for surgery and optimized.  The plan is for repair likely with mesh. The rationale for this been discussed. The options of observation of been reviewed the risks of bleeding infection recurrence mesh placement mesh infection and bowel injury and cosmetic deformity have all been reviewed with the patient in the office and in the preop holding area at his last visit. These risks and options will be reviewed again with him in the preop holding area today  Florene Glen, MD, FACS

## 2014-12-18 NOTE — Anesthesia Postprocedure Evaluation (Signed)
  Anesthesia Post-op Note  Patient: [name]  Procedure(s) Performed: Procedure(s): HERNIA REPAIR UMBILICAL ADULT (N/A) INSERTION OF MESH (N/A) SUPRA-UMBILICAL HERNIA  Anesthesia type:General  Patient location: PACU  Post pain: Pain level controlled  Post assessment: Post-op Vital signs reviewed, Patient's Cardiovascular Status Stable, Respiratory Function Stable, Patent Airway and No signs of Nausea or vomiting  Post vital signs: Reviewed and stable  Last Vitals:  Filed Vitals:   12/18/14 1223  BP: 131/91  Pulse: 88  Temp:   Resp: 24    Level of consciousness:   The patient was somewhat combative waking up and fighting against the tube and mask, but settled down in RR.     Complications: No apparent anesthesia complications

## 2014-12-18 NOTE — OR Nursing (Signed)
[name] [place] [facility]

## 2014-12-18 NOTE — Consult Note (Signed)
[facility] HOSPITALIST  Medical Consultation  [name]:[id number] DOB: Jan 03, 1961 DOA: 12/18/2014 PCP: Jacob Coffin, MD   Requesting physician: Dr. Burt Chavez Date of consultation: 12/18/14 Reason for consultation: Hypoxia  CHIEF COMPLAINT:  Post op hypoxia   HISTORY OF PRESENT ILLNESS: [name]  is a 54 y.o. male with a known history of COPD, sleep apnea, coronary artery disease. Who underwent a hernia repair earlier today. Who in the PACU is noted to have hypoxia. He is currently on BiPAP. Denies any shortness of breath or cough. Patient did have a chest x-ray which showed a hazy infiltrate versus atelectasis. Patient reports that prior to the surgery he was not having any significant respiratory troubles. However he has had a history of having COPD flare intermittently and has been followed by Dr. Raul Chavez.  PAST MEDICAL HISTORY:   Past Medical History  Diagnosis Date  . Hypertension   . Stroke   . COPD (chronic obstructive pulmonary disease)   . Shortness of breath dyspnea   . Pneumonia   . Depression   . Schizophrenia   . History of hiatal hernia     PAST SURGICAL HISTORY:  Past Surgical History  Procedure Laterality Date  . Hernia repair      SOCIAL HISTORY:  History  Substance Use Topics  . Smoking status: Current Every Day Smoker -- 1.00 packs/day for 45 years    Types: Cigarettes  . Smokeless tobacco: Not on file  . Alcohol Use: Not on file    FAMILY HISTORY: Hypertension - father  DRUG ALLERGIES: No Known Allergies  REVIEW OF SYSTEMS:   CONSTITUTIONAL: No fever, fatigue or weakness.  EYES: No blurred or double vision.  EARS, NOSE, AND THROAT: No tinnitus or ear pain.  RESPIRATORY: No cough, shortness of breath, wheezing or hemoptysis.   CARDIOVASCULAR: No chest pain, orthopnea, edema.  GASTROINTESTINAL: No nausea, vomiting, diarrhea or abdominal pain.  GENITOURINARY: No dysuria, hematuria.  ENDOCRINE: No polyuria, nocturia,  HEMATOLOGY: No anemia, easy  bruising or bleeding SKIN: No rash or lesion. MUSCULOSKELETAL: No joint pain or arthritis.   NEUROLOGIC: No tingling, numbness, weakness.  PSYCHIATRY: No anxiety or depression.   MEDICATIONS AT HOME:  Prior to Admission medications   Medication Sig Start Date End Date Taking? Authorizing Provider  acetaminophen (TYLENOL) 325 MG tablet Take 650 mg by mouth 2 (two) times daily as needed for mild pain.   Yes Historical Provider, MD  albuterol (PROVENTIL HFA;VENTOLIN HFA) 108 (90 BASE) MCG/ACT inhaler Inhale 2 puffs into the lungs every 4 (four) hours as needed for wheezing or shortness of breath.   Yes Historical Provider, MD  albuterol (PROVENTIL) (2.5 MG/3ML) 0.083% nebulizer solution Take 2.5 mg by nebulization every 4 (four) hours as needed for wheezing or shortness of breath.   Yes Historical Provider, MD  aspirin 81 MG tablet Take 81 mg by mouth daily.   Yes Historical Provider, MD  atenolol (TENORMIN) 50 MG tablet Take 50 mg by mouth daily.   Yes Historical Provider, MD  budesonide-formoterol (SYMBICORT) 160-4.5 MCG/ACT inhaler Inhale 2 puffs into the lungs 2 (two) times daily.   Yes Historical Provider, MD  cholecalciferol (VITAMIN D) 1000 UNITS tablet Take 1,000 Units by mouth daily.   Yes Historical Provider, MD  clopidogrel (PLAVIX) 75 MG tablet Take 75 mg by mouth daily.   Yes Historical Provider, MD  diltiazem (CARDIZEM CD) 180 MG 24 hr capsule Take 180 mg by mouth daily.   Yes Historical Provider, MD  docusate sodium (  COLACE) 100 MG capsule Take 100 mg by mouth 2 (two) times daily as needed for mild constipation.   Yes Historical Provider, MD  FLUoxetine (PROZAC) 20 MG capsule Take 20 mg by mouth daily.   Yes Historical Provider, MD  guaiFENesin (ROBITUSSIN) 100 MG/5ML liquid Take 200 mg by mouth as needed for cough (every 4 hours as needed for cough).   Yes Historical Provider, MD  guaiFENesin-dextromethorphan (ROBITUSSIN DM) 100-10 MG/5ML syrup Take 5 mLs by mouth every 4 (four)  hours as needed for cough.   Yes Historical Provider, MD  hydrochlorothiazide (HYDRODIURIL) 12.5 MG tablet Take 12.5 mg by mouth daily.   Yes Historical Provider, MD  ipratropium-albuterol (DUONEB) 0.5-2.5 (3) MG/3ML SOLN Take 3 mLs by nebulization every 4 (four) hours as needed (every 4 to 6 hours as needed for shortnes of breath or wheeziing).   Yes Historical Provider, MD  ketoconazole (NIZORAL) 2 % shampoo Apply 1 application topically 2 (two) times a week.   Yes Historical Provider, MD  lovastatin (MEVACOR) 20 MG tablet Take 20 mg by mouth at bedtime.   Yes Historical Provider, MD  Multiple Vitamins-Minerals (MULTIVITAMIN) tablet Take 1 tablet by mouth daily.   Yes Historical Provider, MD  pantoprazole (PROTONIX) 40 MG tablet Take 40 mg by mouth daily.   Yes Historical Provider, MD  potassium chloride SA (K-DUR,KLOR-CON) 20 MEQ tablet Take 20 mEq by mouth once.   Yes Historical Provider, MD  promethazine (PHENERGAN) 25 MG tablet Take 25 mg by mouth every 6 (six) hours as needed for nausea or vomiting.   Yes Historical Provider, MD  QUEtiapine (SEROQUEL) 200 MG tablet Take 200 mg by mouth at bedtime.   Yes Historical Provider, MD  risperiDONE microspheres (RISPERDAL CONSTA) 37.5 MG injection Inject 37.5 mg into the muscle every 14 (fourteen) days.   Yes Historical Provider, MD  tiotropium (SPIRIVA) 18 MCG inhalation capsule Place 18 mcg into inhaler and inhale daily.   Yes Historical Provider, MD  trihexyphenidyl (ARTANE) 2 MG tablet Take 2 mg by mouth 2 (two) times daily.   Yes Historical Provider, MD      PHYSICAL EXAMINATION:   VITAL SIGNS: Blood pressure 131/91, pulse 80, temperature 97.1 F (36.2 C), temperature source Tympanic, resp. rate 19, height '6\' 1"'$  (1.854 m), weight 114.76 kg (253 lb), SpO2 88 %.  GENERAL:  54 y.o.-year-old patient lying in the bed currently on BiPAP. EYES: Pupils equal, round, reactive to light and accommodation. No scleral icterus. Extraocular muscles intact.   HEENT: Head atraumatic, normocephalic. Oropharynx and nasopharynx clear.  NECK:  Supple, no jugular venous distention. No thyroid enlargement, no tenderness.  LUNGS: Diminished breath sounds  bilaterally, no wheezing, rales,rhonchi or crepitation. No use of accessory muscles of respiration.  CARDIOVASCULAR: S1, S2 normal. No murmurs, rubs, or gallops.  ABDOMEN: Soft, postop abdomen  EXTREMITIES: No pedal edema, cyanosis, or clubbing.  NEUROLOGIC: Cranial nerves II through XII are intact. Muscle strength 5/5 in all extremities. Sensation intact. Gait not checked.  PSYCHIATRIC: The patient is alert and oriented x 3.  SKIN: No obvious rash, lesion, or ulcer.   LABORATORY PANEL:   CBC  Recent Labs Lab 12/18/14 0737  WBC 9.9  HGB 13.7  HCT 40.5  PLT 202  MCV 92.9  MCH 31.5  MCHC 33.9  RDW 15.2*  LYMPHSABS 2.9  MONOABS 0.6  EOSABS 0.4  BASOSABS 0.0   ------------------------------------------------------------------------------------------------------------------  Chemistries   Recent Labs Lab 12/18/14 0737  NA 139  K 3.8  CL 103  CO2 30  GLUCOSE 124*  BUN 6  CREATININE 0.70  CALCIUM 9.3  AST 24  ALT 22  ALKPHOS 73  BILITOT 0.4   ------------------------------------------------------------------------------------------------------------------ estimated creatinine clearance is 136.9 mL/min (by C-G formula based on Cr of 0.7). ------------------------------------------------------------------------------------------------------------------ No results for input(s): TSH, T4TOTAL, T3FREE, THYROIDAB in the last 72 hours.  Invalid input(s): FREET3   Coagulation profile No results for input(s): INR, PROTIME in the last 168 hours. ------------------------------------------------------------------------------------------------------------------- No results for input(s): DDIMER in the last 72  hours. -------------------------------------------------------------------------------------------------------------------  Cardiac Enzymes No results for input(s): CKMB, TROPONINI, MYOGLOBIN in the last 168 hours.  Invalid input(s): CK ------------------------------------------------------------------------------------------------------------------ Invalid input(s): POCBNP  ---------------------------------------------------------------------------------------------------------------  Urinalysis No results found for: COLORURINE, APPEARANCEUR, LABSPEC, PHURINE, GLUCOSEU, HGBUR, BILIRUBINUR, KETONESUR, PROTEINUR, UROBILINOGEN, NITRITE, LEUKOCYTESUR   RADIOLOGY: Dg Chest Port 1 View  12/18/2014   CLINICAL DATA:  Low oxygen, umbilical hernia repair today  EXAM: PORTABLE CHEST - 1 VIEW  COMPARISON:  Chest x-ray of 10/18/2014  FINDINGS: Linear scarring at the right lung base remains. However there is slightly more prominent opacity at the left lung base overlying the heart shadow medially. This may represent atelectasis, but pneumonia cannot be excluded. Two-view chest x-ray is recommended if possible. Mediastinal and hilar contours are normal. The heart is within upper limits of normal. No bony abnormality is seen.  IMPRESSION: Slightly more prominent markings medially at the left lung base. Possible atelectasis but pneumonia cannot be excluded. Consider two-view chest x-ray if possible.   Electronically Signed   By: Ivar Drape M.D.   On: 12/18/2014 14:14    EKG: Orders placed or performed during the hospital encounter of 12/18/14  . EKG 12-Lead  . EKG 12-Lead    IMPRESSION AND PLAN: #1. Acute respiratory failure postop; this is likely related to patient's underlying COPD continued nicotine addiction. At this time will treat him with IV Solu-Medrol for acute COPD exacerbation as well as nebulizers. He does routinely follow with Dr. Raul Chavez as outpatient I will ask him to come evaluate the  patient continue BiPAP for now but I suspect we'll be able to wean him off BiPAP and transitioned to nasal cannula. Also start him on incentive spirometry and early ambulation. His chest x-ray does show an ill-defined infiltrate likely representing atelectasis but in light of his surgery I will go ahead and treat him with IV Levaquin for time being.  #2. Hypertension essential; we will continue his atenolol and diltiazem hydrochlorothiazide as taking at home.  #3. Hyperlipidemia continue pravastatin as taking at home.  #4. Nicotine addiction; patient counseled regarding smoking cessation 3 minutes spent strongly recommend that he stop smoking nicotine substitute will be offered to the patient on the when necessary basis       All the records are reviewed . CODE STATUS:    Code Status Orders        Start     Ordered   12/18/14 1151  Full code   Continuous     12/18/14 1151       TOTAL TIME TAKING CARE OF THIS PATIENT: 45mnutes.    PDustin FlockM.D on 12/18/2014 at 3:57 PM  Between 7am to 6pm - Pager - [phone]  After 6pm go to [email or web address] - password [email or web address]  Office  [phone] CC: Primary care physician; ADonnie Coffin MD

## 2014-12-18 NOTE — Transfer of Care (Signed)
Immediate Anesthesia Transfer of Care Note  Patient: [name]  Procedure(s) Performed: Procedure(s): HERNIA REPAIR UMBILICAL ADULT (N/A) INSERTION OF MESH (N/A) SUPRA-UMBILICAL HERNIA  Patient Location: PACU  Anesthesia Type:General  Level of Consciousness: awake and sedated  Airway & Oxygen Therapy: Patient Spontanous Breathing and Patient connected to face mask oxygen  Post-op Assessment: Report given to RN and Post -op Vital signs reviewed and stable  Post vital signs: Reviewed and stable  Last Vitals:  Filed Vitals:   12/18/14 1223  BP: 131/91  Pulse: 88  Temp:   Resp: 24    Complications: No apparent anesthesia complications

## 2014-12-18 NOTE — Op Note (Signed)
Abdominal Hernia Repair  Pre-operative Diagnosis: Umbilical and supraumbilical ventral hernia  Post-operative Diagnosis: Same  Surgeon: Jerrol Banana. Burt Knack, MD FACS  Anesthesia: Gen. with endotracheal tube  Assistant: Hubert Azure PA S  Procedure Details  The patient was seen again in the Holding Room. The benefits, complications, treatment options, and expected outcomes were discussed with the patient. The risks of bleeding, infection, recurrence of symptoms, failure to resolve symptoms, bowel injury, mesh placement, mesh infection, any of which could require further surgery were reviewed with the patient. The likelihood of improving the patient's symptoms with return to their baseline status is good.  The patient and/or family concurred with the proposed plan, giving informed consent.  The patient was taken to Operating Room, identified as [name] and the procedure verified.  A Time Out was held and the above information confirmed.  Prior to the induction of general anesthesia, antibiotic prophylaxis was administered. VTE prophylaxis was in place. General endotracheal anesthesia was then administered and tolerated well. After the induction, the abdomen was prepped with Chloraprep and draped in the sterile fashion. The patient was positioned in the supine position.  A midline incision was utilized to open and explore the subcutaneous tissues the incarcerated supraumbilical ventral hernia was encircled as was the umbilical hernia. The umbilical hernia sac was opened and reduced of some omentum without difficulty. The remainder of the fascia was split on the midline with electrocautery up to the incarcerated supraumbilical ventral hernia. This appeared to be chronically incarcerated omentum which was excised utilizing electrocautery. Hemostasis was adequate as this was chronically scarred.  This specimen was sent off for examination  Further dissection cephalad revealed a second supraumbilical  ventral hernia with incarcerated preperitoneal fat which was reduced.  The wound was measured and an 11 x 14 ventrio mesh was placed . This adequately covered the hernial rent. The mesh was held in place with U sutures or figure-of-eight sutures of 0 Prolene. The mesh was then closed beneath the fascia with interrupted figure-of-eight 0 Prolenes as well  This resulted in fascial coverage over the mesh with no visible mesh noted. Marcaine was infiltrated into the subcutaneous tissues for a total of 30 cc. The tissues were then reapproximated first utilizing 3-0 Vicryl to tack the skin of the umbilicus back to the fascia then 0 Vicryl in a running fashion was utilized in multiple layers to isolate the fascia from the skin. This decrease dead space. Skin staples were then placed. Followed by the placement of a sterile dressing  Patient tolerated this procedure well there were no complications he was taken to the recovery room in stable condition to be admitted for observation. The sponge lap needle count was correct area  Findings:   Umbilical hernia to incarcerated supraumbilical ventral hernias. One of these had chronically incarcerated omentum with scar. This was excised and sent for pathology.  Estimated Blood Loss: 10 cc         Drains: None         Specimens: Chronically incarcerated omentum in a ventral hernia sac          Complications: none               Elvira Langston E. Burt Knack, MD, FACS

## 2014-12-19 LAB — BLOOD GAS, ARTERIAL
Acid-Base Excess: 3.3 mmol/L — ABNORMAL HIGH (ref 0.0–3.0)
Bicarbonate: 29.1 mEq/L — ABNORMAL HIGH (ref 21.0–28.0)
Delivery systems: POSITIVE
Drawn by: 347671
Expiratory PAP: 6
FIO2: 40 %
Inspiratory PAP: 15
O2 Saturation: 90.7 %
PH ART: 7.39 (ref 7.350–7.450)
Patient temperature: 37
pCO2 arterial: 48 mmHg (ref 32.0–48.0)
pO2, Arterial: 61 mmHg — ABNORMAL LOW (ref 83.0–108.0)

## 2014-12-19 LAB — SURGICAL PATHOLOGY

## 2014-12-19 MED ORDER — LEVOFLOXACIN 500 MG PO TABS: 500.0000 mg | ORAL_TABLET | Freq: Every day | ORAL | Status: DC

## 2014-12-19 MED ORDER — MORPHINE SULFATE 2 MG/ML IJ SOLN
2.0000 mg | INTRAMUSCULAR | Status: DC | PRN
  Administered 2014-12-19 – 2014-12-20 (×2): 2 mg via INTRAVENOUS
  Filled 2014-12-19 (×2): qty 1

## 2014-12-19 MED ORDER — NICOTINE 14 MG/24HR TD PT24
14.0000 mg | MEDICATED_PATCH | Freq: Every day | TRANSDERMAL | Status: DC
  Administered 2014-12-19 – 2014-12-24 (×6): 14 mg via TRANSDERMAL
  Filled 2014-12-19 (×6): qty 1

## 2014-12-19 MED ORDER — LEVOFLOXACIN 500 MG PO TABS
500.0000 mg | ORAL_TABLET | ORAL | Status: DC
  Administered 2014-12-19 – 2014-12-23 (×5): 500 mg via ORAL
  Filled 2014-12-19 (×5): qty 1

## 2014-12-19 MED ORDER — METHYLPREDNISOLONE SODIUM SUCC 125 MG IJ SOLR
60.0000 mg | Freq: Two times a day (BID) | INTRAMUSCULAR | Status: DC
  Administered 2014-12-19: 125 mg via INTRAVENOUS
  Administered 2014-12-20 – 2014-12-22 (×6): 60 mg via INTRAVENOUS
  Filled 2014-12-19 (×7): qty 2

## 2014-12-19 NOTE — Progress Notes (Signed)
1 Day Post-Op  Subjective:  Patient is status post ventral hernia repair with mesh. He feels well with moderate pain. His pain is well controlled. Denies nausea or vomiting or shortness of breath. He is currently undergoing a breathing treatment.  Objective: Vital signs in last 24 hours: Temp:  [97.1 F (36.2 C)-98.4 F (36.9 C)] 98.1 F (36.7 C) (05/12 2345) Pulse Rate:  [43-102] 93 (05/12 2345) Resp:  [16-24] 18 (05/12 2345) BP: (118-140)/(77-91) 119/80 mmHg (05/12 2345) SpO2:  [88 %-99 %] 92 % (05/13 0756) FiO2 (%):  [40 %-50 %] 50 % (05/13 0756)    Intake/Output from previous day: 05/12 0701 - 05/13 0700 In: 2540 [P.O.:150; I.V.:2390] Out: 74 [Urine:1900] Intake/Output this shift: Total I/O In: 72.1 [I.V.:72.1] Out: 0   Physical exam:  Patient appears comfortable. Wound is dressed. Abdomen is soft only minimally tender. Calves are nontender  Lab Results: CBC   Recent Labs  12/18/14 0737  WBC 9.9  HGB 13.7  HCT 40.5  PLT 202   BMET  Recent Labs  12/18/14 0737  NA 139  K 3.8  CL 103  CO2 30  GLUCOSE 124*  BUN 6  CREATININE 0.70  CALCIUM 9.3   PT/INR No results for input(s): LABPROT, INR in the last 72 hours. ABG No results for input(s): PHART, HCO3 in the last 72 hours.  Invalid input(s): PCO2, PO2  Studies/Results: Dg Chest Port 1 View  12/18/2014   CLINICAL DATA:  Low oxygen, umbilical hernia repair today  EXAM: PORTABLE CHEST - 1 VIEW  COMPARISON:  Chest x-ray of 10/18/2014  FINDINGS: Linear scarring at the right lung base remains. However there is slightly more prominent opacity at the left lung base overlying the heart shadow medially. This may represent atelectasis, but pneumonia cannot be excluded. Two-view chest x-ray is recommended if possible. Mediastinal and hilar contours are normal. The heart is within upper limits of normal. No bony abnormality is seen.  IMPRESSION: Slightly more prominent markings medially at the left lung base.  Possible atelectasis but pneumonia cannot be excluded. Consider two-view chest x-ray if possible.   Electronically Signed   By: Ivar Drape M.D.   On: 12/18/2014 14:14    Anti-infectives: Anti-infectives    Start     Dose/Rate Route Frequency Ordered Stop   12/18/14 1800  levofloxacin (LEVAQUIN) IVPB 500 mg     500 mg 100 mL/hr over 60 Minutes Intravenous Every 24 hours 12/18/14 1556     12/18/14 0722  ceFAZolin (ANCEF) IVPB 2 g/50 mL premix     2 g 100 mL/hr over 30 Minutes Intravenous On call to O.R. 12/18/14 0722 12/18/14 1049   12/18/14 0714  ceFAZolin (ANCEF) 2-3 GM-% IVPB SOLR    Comments:  HENRY, LOIS: cabinet override      12/18/14 0714 12/18/14 1914      Assessment/Plan: s/p Procedure(s): HERNIA REPAIR UMBILICAL ADULT INSERTION OF MESH SUPRA-UMBILICAL HERNIA   Ventral hernia repair with mesh postop day 1. Patient doing very well I discussed patient with Dr. Arbie Cookey and I have reviewed the consult notes from Dr. Posey Pronto. I will advance his diet and see how he does this afternoon and possibly discharging him later today if his saturations are doing well.Florene Glen, MD, FACS  12/19/2014

## 2014-12-19 NOTE — Progress Notes (Signed)
[facility] [place] NAME: [name]    MR#:  [id number]  DATE OF BIRTH:  05/14/1961  SUBJECTIVE:  CHIEF COMPLAINT: Pt. Here due to umbilical hernia repair and post-operatively noted to be short of breath due to COPD Exacerbation.   Shortness of breath improved. Still has some wheezing. Remains on Venti-mask.   REVIEW OF SYSTEMS:    Review of Systems  Constitutional: Negative for fever and chills.  HENT: Negative for congestion and tinnitus.   Eyes: Negative for blurred vision and double vision.  Respiratory: Positive for cough (non-productive), shortness of breath (Improved) and wheezing.   Cardiovascular: Negative for chest pain, orthopnea and PND.  Gastrointestinal: Positive for abdominal pain (from recent surgery). Negative for nausea, vomiting and diarrhea.  Genitourinary: Negative for dysuria and hematuria.  Neurological: Negative for dizziness, sensory change and focal weakness.  All other systems reviewed and are negative.  Nutrition: Clear liquis Tolerating Diet: Yes  DRUG ALLERGIES:  No Known Allergies  VITALS:  Blood pressure 115/49, pulse 94, temperature 98.1 F (36.7 C), temperature source Oral, resp. rate 20, height '6\' 1"'$  (1.854 m), weight 114.76 kg (253 lb), SpO2 91 %.  PHYSICAL EXAMINATION:   Physical Exam  GENERAL:  54 y.o.-year-old patient lying in the bed in mild resp. Distress on venti-mask.  EYES: Pupils equal, round, reactive to light and accommodation. No scleral icterus. Extraocular muscles intact.  HEENT: Head atraumatic, normocephalic. Oropharynx and nasopharynx clear.  NECK:  Supple, no jugular venous distention. No thyroid enlargement, no tenderness.  LUNGS: (-) use of accessory muscles. Good a/e b/l. Diffuse wheezing b/l.  CARDIOVASCULAR: S1, S2 normal. No murmurs, rubs, or gallops.  ABDOMEN: Soft Tender near surgical site.  + mid-abdomen dressing. Hypoactive BS.  No organomegaly.   EXTREMITIES: No cyanosis, clubbing or edema b/l.    NEUROLOGIC: Cranial nerves II through XII are intact. No focal Motor or sensory deficits b/l.  Globally weak PSYCHIATRIC: The patient is alert and oriented x 3.  SKIN: No obvious rash, lesion, or ulcer.    LABORATORY PANEL:   CBC  Recent Labs Lab 12/18/14 0737  WBC 9.9  HGB 13.7  HCT 40.5  PLT 202   ------------------------------------------------------------------------------------------------------------------  Chemistries   Recent Labs Lab 12/18/14 0737  NA 139  K 3.8  CL 103  CO2 30  GLUCOSE 124*  BUN 6  CREATININE 0.70  CALCIUM 9.3  AST 24  ALT 22  ALKPHOS 73  BILITOT 0.4   ------------------------------------------------------------------------------------------------------------------  Cardiac Enzymes No results for input(s): TROPONINI in the last 168 hours. ------------------------------------------------------------------------------------------------------------------  RADIOLOGY:  Dg Chest Port 1 View  12/18/2014   CLINICAL DATA:  Low oxygen, umbilical hernia repair today  EXAM: PORTABLE CHEST - 1 VIEW  COMPARISON:  Chest x-ray of 10/18/2014  FINDINGS: Linear scarring at the right lung base remains. However there is slightly more prominent opacity at the left lung base overlying the heart shadow medially. This may represent atelectasis, but pneumonia cannot be excluded. Two-view chest x-ray is recommended if possible. Mediastinal and hilar contours are normal. The heart is within upper limits of normal. No bony abnormality is seen.  IMPRESSION: Slightly more prominent markings medially at the left lung base. Possible atelectasis but pneumonia cannot be excluded. Consider two-view chest x-ray if possible.   Electronically Signed   By: Ivar Drape M.D.   On: 12/18/2014 14:14     ASSESSMENT AND PLAN:   54 yo male w/ hx of COPD, HTN,  CVA, Depression, Schizophrenia, came into hospital due for ventral hernia  repair and post-operatively noted to be short of breath from COPD Exacerbation.   1.  COPD Exacerbation - likely cause of pt's shortness of breath.  - cont. Iv steroids but will taper.  Cont. Duonebs.  Symbicort, Spiriva.  - CXR yesterday showing ?? Pneumonia - cont. Levaquin empirically.  - will get ABG.  - wean off venti-mask to [place] if possible.   2. Hx of Schizophrenia - cont. Risperdal, Seroquel  3. Depression - cont. Prozac.   4. HTN - cont. Atenolol, HCTZ, Cardizem.   5. S/p Ventral Hernia repair - cont. Care as per surgery.  - cont. Pain control, diet as per surgery.   All the records are reviewed and case discussed with Care Management/Social Workerr. Management plans discussed with the patient, family and they are in agreement.  CODE STATUS: Full  DVT Prophylaxis: Heparin SQ  TOTAL TIME TAKING CARE OF THIS PATIENT: 30 minutes.   D/c planning as per surgery.    Henreitta Leber M.D on 12/19/2014 at 11:28 AM  Between 7am to 6pm - Pager - [phone]  After 6pm go to [email or web address] - password [email or web address] [facility]  Office  [phone]  CC: Primary care physician; Donnie Coffin, MD

## 2014-12-19 NOTE — Progress Notes (Signed)
   12/19/14 1751  Oxygen Therapy/Pulse Ox  O2 Device Venturi Mask  O2 Flow Rate (L/min) 12 L/min  FiO2 (%) 50 %  SpO2 92 %  Removed patient from bipap per his request to eat.  Placed the patient back on 50% venturi mask. 02 saturation was 92%. He got upset and took the mask off stating that he had to eat.  I educated the patient on the dangers of hypoxia and low blood oxygen levels.  He placed the venturi mask back on and began to slowly eat.  RN is aware.

## 2014-12-19 NOTE — Progress Notes (Signed)
Date: 12/19/2014,   MRN# [id number] [name] [birth year] Code Status:     Code Status Orders        Start     Ordered   12/18/14 1151  Full code   Continuous     12/18/14 1151     Hosp day:'@LENGTHOFSTAYDAYS'$ @ Referring MD: '@ATDPROV'$ @     PCP:     CC: Respiratory failure, on bipap  HPI: This is a 54 year old year old male, known history of copd, smoker, hx of schizophrenia, s/p ventral hernia repair. Post operatively noted to be short of breath. Presently on bipap 15/9. Presently no pleurisy, leg pain, hemoptysis, syncope, ectopy, swallowing difficulty. He states he is wheezing some  PMHX:   Past Medical History  Diagnosis Date  . Hypertension   . Stroke   . COPD (chronic obstructive pulmonary disease)   . Shortness of breath dyspnea   . Pneumonia   . Depression   . Schizophrenia   . History of hiatal hernia    Surgical Hx:  Past Surgical History  Procedure Laterality Date  . Hernia repair     Family Hx:  History reviewed. No pertinent family history. Social Hx:   History  Substance Use Topics  . Smoking status: Current Every Day Smoker -- 1.00 packs/day for 45 years    Types: Cigarettes  . Smokeless tobacco: Not on file  . Alcohol Use: Not on file   Medication:    Home Medication:  No current outpatient prescriptions on file.  Current Medication: '@CURMEDTAB'$ @   Allergies:  Review of patient's allergies indicates no known allergies.  Review of Systems: Gen:  Denies  fever, sweats, chills HEENT: Denies blurred vision, double vision, ear pain, eye pain, hearing loss, nose bleeds, sore throat Cvc:  No dizziness, chest pain or heaviness Resp: Some wheezing,  No hemoptysis, no pleurisy    Gi: Denies swallowing difficulty, stomach pain, nausea or vomiting, diarrhea, constipation, bowel incontinence Gu:  Denies bladder incontinence, burning urine Ext:   No Joint pain, stiffness or swelling Skin: No skin rash, easy bruising or bleeding or hives Endoc:  No  polyuria, polydipsia , polyphagia or weight change Psych: No depression, insomnia or hallucinations  Other:  All other systems negative  Physical Examination:   VS: BP 126/69 mmHg  Pulse 85  Temp(Src) 96.7 F (35.9 C) (Axillary)  Resp 21  Ht '6\' 1"'$  (1.854 m)  Wt 253 lb (114.76 kg)  BMI 33.39 kg/m2  SpO2 93%  General Appearance: Bipap is on, no audible wheezing, no use of  Accessory muscles, obese Neuro/psych without focal findings, mental status, speech normal, alert and oriented, no manic episodes, cranial nerves 2-12 intact, reflexes normal and symmetric, sensation grossly normal  HEENT: PERRLA, EOM intact, no ptosis, no other lesions noticed, Mallampati: Pulmonary:.Bilateral wheezing, No rales  No rhonchi   Cardiovascular:  Normal S1,S2.  No m/r/g.  Abdominal aorta pulsation normal.    Abdomen:Benign, Soft, non-tender, No masses, hepatosplenomegaly, No lymphadenopathy Endoc: No evident thyromegaly, no signs of acromegaly or Cushing features Skin:   warm, no rashes, no ecchymosis  Extremities: normal, no cyanosis, clubbing, no edema, warm with normal capillary refill. Other findings:   Labs results:   Recent Labs     12/18/14  0737  HGB  13.7  HCT  40.5  MCV  92.9  WBC  9.9  BUN  6  CREATININE  0.70  GLUCOSE  124*  CALCIUM  9.3  ,  3:29 PM  FIO2 % 40.00   Delivery systems  BILEVEL POSITIVE AIRWAY PRESSURE   Inspiratory PAP  15   Expiratory PAP  6   pH, Arterial 7.350 - 7.450  7.39   pCO2 arterial 32.0 - 48.0 mmHg 48   pO2, Arterial 83.0 - 108.0 mmHg 61 (L)   Bicarbonate 21.0 - 28.0 mEq/L 29.1 (H)   Acid-Base Excess 0.0 - 3.0 mmol/L 3.3 (H)   O2 Saturation % 90.7   Patient temperature  37.0   Collection site  RIGHT RADIAL   Drawn by  [id number]   Sample type  ARTERIAL DRAW   Allens test (pass/fail) PASS  PASS            Culture results:     Rad results:   Dg Chest Port 1 View  12/18/2014   CLINICAL DATA:  Low oxygen, umbilical hernia  repair today  EXAM: PORTABLE CHEST - 1 VIEW  COMPARISON:  Chest x-ray of 10/18/2014  FINDINGS: Linear scarring at the right lung base remains. However there is slightly more prominent opacity at the left lung base overlying the heart shadow medially. This may represent atelectasis, but pneumonia cannot be excluded. Two-view chest x-ray is recommended if possible. Mediastinal and hilar contours are normal. The heart is within upper limits of normal. No bony abnormality is seen.  IMPRESSION: Slightly more prominent markings medially at the left lung base. Possible atelectasis but pneumonia cannot be excluded. Consider two-view chest x-ray if possible.   Electronically Signed   By: Ivar Drape M.D.   On: 12/18/2014 14:14   Assessment and Plan: 1. COPD by hx.  2. Suspect he has sleep apnea, obstructive 3. ? Hypoventilation 2/2 meds 4. Schizophrenia  Plan: - wean bipap as tolerated - limit sedation - emperic antibiotics as is - increase ambulation,dvt prophylaxis (support hoses/ pneumatic stockings - out patient sleep study - following      I have personally obtained a history, examined the patient, evaluated laboratory and imaging results, formulated the assessment and plan.  The Patient requires high complexity decision making for assessment and support, frequent evaluation and titration of therapies, application of advanced monitoring technologies and extensive interpretation of multiple databases.   Herbon Fleming,M.D. Pulmonary & Critical care Medicine [facility]

## 2014-12-19 NOTE — Progress Notes (Signed)
Visit patient after lunch. He tolerated a regular diet without problems. Still has moderate pain but it is controlled.  Patient is back on the BiPAP  She will likely be able to go home tomorrow.

## 2014-12-20 MED ORDER — IPRATROPIUM-ALBUTEROL 0.5-2.5 (3) MG/3ML IN SOLN
3.0000 mL | Freq: Four times a day (QID) | RESPIRATORY_TRACT | Status: DC
  Administered 2014-12-20 – 2014-12-23 (×14): 3 mL via RESPIRATORY_TRACT
  Filled 2014-12-20 (×13): qty 3

## 2014-12-20 NOTE — Progress Notes (Signed)
2 Days Post-Op  Subjective: Patient feels well today. Tolerating a regular diet he is on 50% oxygen via mask however. I reviewed Dr. Gust Brooms note from last night.  Objective: Vital signs in last 24 hours: Temp:  [96.7 F (35.9 C)-98.4 F (36.9 C)] 97.7 F (36.5 C) (05/14 0813) Pulse Rate:  [80-94] 81 (05/14 0813) Resp:  [18-21] 20 (05/14 0001) BP: (115-141)/(49-89) 126/89 mmHg (05/14 0813) SpO2:  [91 %-96 %] 96 % (05/14 0813) FiO2 (%):  [50 %] 50 % (05/14 0732)    Intake/Output from previous day: 05/13 0701 - 05/14 0700 In: 2045.1 [P.O.:1973; I.V.:72.1] Out: 2650 [Urine:2650] Intake/Output this shift:    Physical exam:  Abdomen is soft and nontender, nondistended wound is clean without erythema or drainage. Calves are nontender  Lab Results: CBC   Recent Labs  12/18/14 0737  WBC 9.9  HGB 13.7  HCT 40.5  PLT 202   BMET  Recent Labs  12/18/14 0737  NA 139  K 3.8  CL 103  CO2 30  GLUCOSE 124*  BUN 6  CREATININE 0.70  CALCIUM 9.3   PT/INR No results for input(s): LABPROT, INR in the last 72 hours. ABG  Recent Labs  12/19/14 1529  PHART 7.39  HCO3 29.1*    Studies/Results: Dg Chest Port 1 View  12/18/2014   CLINICAL DATA:  Low oxygen, umbilical hernia repair today  EXAM: PORTABLE CHEST - 1 VIEW  COMPARISON:  Chest x-ray of 10/18/2014  FINDINGS: Linear scarring at the right lung base remains. However there is slightly more prominent opacity at the left lung base overlying the heart shadow medially. This may represent atelectasis, but pneumonia cannot be excluded. Two-view chest x-ray is recommended if possible. Mediastinal and hilar contours are normal. The heart is within upper limits of normal. No bony abnormality is seen.  IMPRESSION: Slightly more prominent markings medially at the left lung base. Possible atelectasis but pneumonia cannot be excluded. Consider two-view chest x-ray if possible.   Electronically Signed   By: Ivar Drape M.D.   On:  12/18/2014 14:14    Anti-infectives: Anti-infectives    Start     Dose/Rate Route Frequency Ordered Stop   12/19/14 1800  levofloxacin (LEVAQUIN) tablet 500 mg  Status:  Discontinued     500 mg Oral Daily 12/19/14 1225 12/19/14 1300   12/19/14 1800  levofloxacin (LEVAQUIN) tablet 500 mg     500 mg Oral Every 24 hours 12/19/14 1300     12/18/14 1800  levofloxacin (LEVAQUIN) IVPB 500 mg  Status:  Discontinued     500 mg 100 mL/hr over 60 Minutes Intravenous Every 24 hours 12/18/14 1556 12/19/14 1225   12/18/14 0722  ceFAZolin (ANCEF) IVPB 2 g/50 mL premix     2 g 100 mL/hr over 30 Minutes Intravenous On call to O.R. 12/18/14 0722 12/18/14 1049   12/18/14 0714  ceFAZolin (ANCEF) 2-3 GM-% IVPB SOLR    Comments:  HENRY, LOIS: cabinet override      12/18/14 0714 12/18/14 1914      Assessment/Plan: s/p Procedure(s): HERNIA REPAIR UMBILICAL ADULT INSERTION OF MESH SUPRA-UMBILICAL HERNIA   Patient doing very well post ventral hernia however his oxygen saturations while normal R requiring supplemental oxygen to a fairly high degree. I do not anticipate him being able to go home today about possibly tomorrow.Florene Glen, MD, FACS  12/20/2014

## 2014-12-20 NOTE — Progress Notes (Signed)
Date: 12/20/2014,   MRN# [id number] [name] April 03, 1961 Code Status:     Code Status Orders        Start     Ordered   12/18/14 1151  Full code   Continuous     12/18/14 1151        HPI: This is a 54 year old year old male, known history of copd, smoker, hx of schizophrenia, s/p ventral hernia repair. Post operatively noted to be short of breath. Consequently placed on  bipap 15/9. Presently his off, setting in the chair, no  pleurisy, leg pain, hemoptysis, syncope, ectopy, swallowing difficulty. He states he is not wheezing now.    PMHX:   Past Medical History  Diagnosis Date  . Hypertension   . Stroke   . COPD (chronic obstructive pulmonary disease)   . Shortness of breath dyspnea   . Pneumonia   . Depression   . Schizophrenia   . History of hiatal hernia    Surgical Hx:  Past Surgical History  Procedure Laterality Date  . Hernia repair     Family Hx:  History reviewed. No pertinent family history. Social Hx:   History  Substance Use Topics  . Smoking status: Current Every Day Smoker -- 1.00 packs/day for 45 years    Types: Cigarettes  . Smokeless tobacco: Not on file  . Alcohol Use: Not on file   Medication:    Home Medication:  No current outpatient prescriptions on file.  Current Medication: '@CURMEDTAB'$ @   Allergies:  Review of patient's allergies indicates no known allergies.  Review of Systems: Gen:  Denies  fever, sweats, chills HEENT: Denies blurred vision, double vision, ear pain, eye pain, hearing loss, nose bleeds, sore throat Cvc:  No dizziness, chest pain or heaviness Resp:   No wheezing, cough or tightness Gi: Denies swallowing difficulty, stomach pain, nausea or vomiting, diarrhea, constipation, bowel incontinence Gu:  Denies bladder incontinence, burning urine Ext:   No Joint pain, stiffness or swelling Skin: No skin rash, easy bruising or bleeding or hives Endoc:  No polyuria, polydipsia , polyphagia or weight change Psych: No  depression, insomnia or hallucinations  Other:  All other systems negative  Physical Examination:   VS: BP 135/77 mmHg  Pulse 80  Temp(Src) 97.8 F (36.6 C) (Axillary)  Resp 21  Ht '6\' 1"'$  (1.854 m)  Wt 253 lb (114.76 kg)  BMI 33.39 kg/m2  SpO2 95%  General Appearance: No distress, sitting in the chair, comfortable  Neuro/psych: without focal findings, mental status, speech normal, alert and oriented, cranial nerves 2-12 intact, reflexes normal and symmetric, sensation grossly normal  HEENT: PERRLA, EOM intact, no ptosis, no other lesions noticed, Mallampati: Pulmonary:.No wheezing, No rales  Sputum Production:   Cardiovascular:  Normal S1,S2.  No m/r/g.  Abdominal aorta pulsation normal.    Abdomen:Benign, Soft, non-tender, No masses, hepatosplenomegaly, No lymphadenopathy Endoc: No evident thyromegaly, no signs of acromegaly or Cushing features Skin:   warm, no rashes, no ecchymosis  Extremities: normal, no cyanosis, clubbing, no edema, warm with normal capillary refill. Other findings:   Labs results:   Recent Labs     12/18/14  0737  HGB  13.7  HCT  40.5  MCV  92.9  WBC  9.9  BUN  6  CREATININE  0.70  GLUCOSE  124*  CALCIUM  9.3  ,         Rad results:      EKG:     Other:  Assessment and Plan: 1. COPD by hx.  2. Suspect he has sleep apnea, obstructive 3. Hypoventilation 2/2 meds, resolved 4. Schizophrenia 5. Ventral hernia repair, stable post op  Plan: - limit sedation - continue emperic antibiotics as is - increase ambulation,dvt prophylaxis (support hoses/ pneumatic stockings) - out patient sleep study   I have personally obtained a history, examined the patient, evaluated laboratory and imaging results, formulated the assessment and plan and placed orders.  The Patient requires high complexity decision making for assessment and support, frequent evaluation and titration of therapies, application of advanced monitoring technologies and  extensive interpretation of multiple databases.   Herbon Fleming,M.D. Pulmonary & Critical care Medicine [facility]

## 2014-12-20 NOTE — Progress Notes (Signed)
[place] at [place] NAME: [name]    MR#:  [id number]  DATE OF BIRTH:  1961/05/28  SUBJECTIVE:  On ventimask  sats 99%,unable to cough up phlegm. No wheezing. Sob+  REVIEW OF SYSTEMS:   Review of Systems  Constitutional: Negative for fever, chills and weight loss.  HENT: Negative for ear discharge, ear pain and nosebleeds.   Eyes: Negative for blurred vision, pain and discharge.  Respiratory: Positive for cough and shortness of breath. Negative for sputum production, wheezing and stridor.   Cardiovascular: Negative for chest pain, palpitations, orthopnea and PND.  Gastrointestinal: Negative for nausea, vomiting, abdominal pain and diarrhea.  Genitourinary: Negative for urgency and frequency.  Musculoskeletal: Negative for back pain and joint pain.  Neurological: Negative for sensory change, speech change, focal weakness and weakness.  Psychiatric/Behavioral: Negative for depression. The patient is not nervous/anxious.   All other systems reviewed and are negative.  Nutrition: po Tolerating PT: able to ambulate by himself Tolerating diet: yes  DRUG ALLERGIES:  No Known Allergies  VITALS:  Blood pressure 126/89, pulse 81, temperature 97.7 F (36.5 C), temperature source Oral, resp. rate 20, height '6\' 1"'$  (1.854 m), weight 114.76 kg (253 lb), SpO2 99 %.  PHYSICAL EXAMINATION:  GENERAL:  54 y.o.-year-old patient lying in the bed with no acute distress.  EYES: Pupils equal, round, reactive to light and accommodation. No scleral icterus. Extraocular muscles intact.  HEENT: Head atraumatic, normocephalic. Oropharynx and nasopharynx clear.  NECK:  Supple, no jugular venous distention. No thyroid enlargement, no tenderness.  LUNGS: Normal breath sounds bilaterally, no wheezing, rales,rhonchi or crepitation. No use of accessory muscles of respiration.  -decreased bs bases CARDIOVASCULAR: S1, S2 normal. No murmurs, rubs, or gallops.   ABDOMEN: Soft, nontender, nondistended. Bowel sounds present. No organomegaly or mass.  EXTREMITIES: No pedal edema, cyanosis, or clubbing.  NEUROLOGIC: Cranial nerves II through XII are intact. Muscle strength 5/5 in all extremities. Sensation intact. Gait not checked.  PSYCHIATRIC: The patient is alert and oriented x 3.  SKIN: No obvious rash, lesion, or ulcer.    LABORATORY PANEL:   CBC  Recent Labs Lab 12/18/14 0737  WBC 9.9  HGB 13.7  HCT 40.5  PLT 202   ------------------------------------------------------------------------------------------------------------------  Chemistries   Recent Labs Lab 12/18/14 0737  NA 139  K 3.8  CL 103  CO2 30  GLUCOSE 124*  BUN 6  CREATININE 0.70  CALCIUM 9.3  AST 24  ALT 22  ALKPHOS 73  BILITOT 0.4   ------------------------------------------------------------------------------------------------------------------  Cardiac Enzymes No results for input(s): TROPONINI in the last 168 hours. ------------------------------------------------------------------------------------------------------------------  RADIOLOGY:  Dg Chest Port 1 View  12/18/2014   CLINICAL DATA:  Low oxygen, umbilical hernia repair today  EXAM: PORTABLE CHEST - 1 VIEW  COMPARISON:  Chest x-ray of 10/18/2014  FINDINGS: Linear scarring at the right lung base remains. However there is slightly more prominent opacity at the left lung base overlying the heart shadow medially. This may represent atelectasis, but pneumonia cannot be excluded. Two-view chest x-ray is recommended if possible. Mediastinal and hilar contours are normal. The heart is within upper limits of normal. No bony abnormality is seen.  IMPRESSION: Slightly more prominent markings medially at the left lung base. Possible atelectasis but pneumonia cannot be excluded. Consider two-view chest x-ray if possible.   Electronically Signed   By: Ivar Drape M.D.   On: 12/18/2014 14:14     ASSESSMENT AND  PLAN:  54 yo male w/ hx of COPD, HTN, CVA, Depression, Schizophrenia, came into hospital due for ventral hernia repair and post-operatively noted to be short of breath from COPD Exacerbation.   1. COPD Exacerbation - likely cause of pt's shortness of breath.  - cont. Iv steroids but will taper. Cont. Duonebs. Symbicort, Spiriva.  - CXR yesterday showing ?? Pneumonia - cont. Levaquin empirically.  - wean off venti-mask to [place] today, ambulate in the room.  -incentive spirometer  2. Hx of Schizophrenia - cont. Risperdal, Seroquel  3. Depression - cont. Prozac.   4. HTN - cont. Atenolol, HCTZ, Cardizem.   5. S/p Ventral Hernia repair - cont. Care as per surgery.  - cont. Pain control, diet as per surgery.   All the records are reviewed and case discussed with Care Management/Social Workerr. Management plans discussed with the patient, family and they are in agreement.  CODE STATUS: full  TOTAL TIME TAKING CARE OF THIS PATIENT: 17mnutes.   POSSIBLE D/C IN 1 DAY, DEPENDING ON CLINICAL CONDITION.   Kathleena Freeman M.D on 12/20/2014 at 9:58 AM  Between 7am to 6pm - Pager - [phone]  After 6pm go to [email or web address] - password [email or web address] [facility]  Office  [phone] CC: Primary care physician; ADonnie Coffin MD

## 2014-12-21 ENCOUNTER — Encounter: Payer: Self-pay | Admitting: Surgery

## 2014-12-21 ENCOUNTER — Inpatient Hospital Stay: Payer: Medicare Other

## 2014-12-21 LAB — BRAIN NATRIURETIC PEPTIDE: B Natriuretic Peptide: 212 pg/mL — ABNORMAL HIGH (ref 0.0–100.0)

## 2014-12-21 LAB — GLUCOSE, CAPILLARY: Glucose-Capillary: 179 mg/dL — ABNORMAL HIGH (ref 65–99)

## 2014-12-21 NOTE — Progress Notes (Signed)
3 Days Post-Op  Subjective: Status post ventral hernia repair with mesh. Minimal pain. Ongoing COPD difficulties. Discussed with RT who states he is satting at 87% on 6 L.  Objective: Vital signs in last 24 hours: Temp:  [97.7 F (36.5 C)-98.3 F (36.8 C)] 98.3 F (36.8 C) (05/15 0035) Pulse Rate:  [75-94] 94 (05/15 0035) Resp:  [20-21] 20 (05/15 0035) BP: (126-139)/(77-89) 139/85 mmHg (05/15 0035) SpO2:  [89 %-99 %] 89 % (05/15 0733) FiO2 (%):  [50 %] 50 % (05/14 0945) Last BM Date: 12/18/14  Intake/Output from previous day: 05/14 0701 - 05/15 0700 In: 2640 [P.O.:2640] Out: 7400 [Urine:7400] Intake/Output this shift:    Physical exam:  Wheezes, abdomen soft and minimally tender. Nondistended. Wound clean. Nontender.  Lab Results: CBC  No results for input(s): WBC, HGB, HCT, PLT in the last 72 hours. BMET No results for input(s): NA, K, CL, CO2, GLUCOSE, BUN, CREATININE, CALCIUM in the last 72 hours. PT/INR No results for input(s): LABPROT, INR in the last 72 hours. ABG  Recent Labs  12/19/14 1529  PHART 7.39  HCO3 29.1*    Studies/Results: No results found.  Anti-infectives: Anti-infectives    Start     Dose/Rate Route Frequency Ordered Stop   12/19/14 1800  levofloxacin (LEVAQUIN) tablet 500 mg  Status:  Discontinued     500 mg Oral Daily 12/19/14 1225 12/19/14 1300   12/19/14 1800  levofloxacin (LEVAQUIN) tablet 500 mg     500 mg Oral Every 24 hours 12/19/14 1300     12/18/14 1800  levofloxacin (LEVAQUIN) IVPB 500 mg  Status:  Discontinued     500 mg 100 mL/hr over 60 Minutes Intravenous Every 24 hours 12/18/14 1556 12/19/14 1225   12/18/14 0722  ceFAZolin (ANCEF) IVPB 2 g/50 mL premix     2 g 100 mL/hr over 30 Minutes Intravenous On call to O.R. 12/18/14 0722 12/18/14 1049   12/18/14 0714  ceFAZolin (ANCEF) 2-3 GM-% IVPB SOLR    Comments:  HENRY, LOIS: cabinet override      12/18/14 0714 12/18/14 1914      Assessment/Plan: s/p  Procedure(s): HERNIA REPAIR UMBILICAL ADULT INSERTION OF MESH SUPRA-UMBILICAL HERNIA   Ongoing COPD difficulties which are holding back his ability to be discharged. I discussed this patient with RT I will obtain a cyst chest x-ray today and await pulmonary and I am input.-*  Florene Glen, MD, FACS  12/21/2014

## 2014-12-21 NOTE — Progress Notes (Signed)
[place] at [place] NAME: [name]    MR#:  [id number]  DATE OF BIRTH:  August 28, 1960  SUBJECTIVE:  On 5 liter [place]. sats 91-93%. Cough+ Sob+  REVIEW OF SYSTEMS:   ROS Nutrition: po Tolerating PT: able to ambulate by himself Tolerating diet: yes  DRUG ALLERGIES:  No Known Allergies  VITALS:  Blood pressure 137/84, pulse 88, temperature 98 F (36.7 C), temperature source Oral, resp. rate 17, height '6\' 1"'$  (1.854 m), weight 114.76 kg (253 lb), SpO2 92 %.  PHYSICAL EXAMINATION:  GENERAL:  54 y.o.-year-old patient lying in the bed with no acute distress.  EYES: Pupils equal, round, reactive to light and accommodation. No scleral icterus. Extraocular muscles intact.  HEENT: Head atraumatic, normocephalic. Oropharynx and nasopharynx clear.  NECK:  Supple, no jugular venous distention. No thyroid enlargement, no tenderness.  LUNGS:coarse breath sounds bilaterally, scattered wheezing, rales,rhonchi or crepitation. No use of accessory muscles of respiration.  -decreased bs bases CARDIOVASCULAR: S1, S2 normal. No murmurs, rubs, or gallops.  ABDOMEN: Soft, nontender, distended. Bowel sounds present. No organomegaly or mass. Midline staples + EXTREMITIES: No pedal edema, cyanosis, or clubbing.  NEUROLOGIC: Cranial nerves II through XII are intact. Muscle strength 5/5 in all extremities. Sensation intact. Gait not checked.  PSYCHIATRIC: The patient is alert and oriented x 3.  SKIN: No obvious rash, lesion, or ulcer.    LABORATORY PANEL:   CBC  Recent Labs Lab 12/18/14 0737  WBC 9.9  HGB 13.7  HCT 40.5  PLT 202   ------------------------------------------------------------------------------------------------------------------  Chemistries   Recent Labs Lab 12/18/14 0737  NA 139  K 3.8  CL 103  CO2 30  GLUCOSE 124*  BUN 6  CREATININE 0.70  CALCIUM 9.3  AST 24  ALT 22  ALKPHOS 73  BILITOT 0.4    ------------------------------------------------------------------------------------------------------------------  Cardiac Enzymes No results for input(s): TROPONINI in the last 168 hours. ------------------------------------------------------------------------------------------------------------------  RADIOLOGY:  Dg Chest 2 View  12/21/2014   CLINICAL DATA:  COPD exacerbation, short of breath and cough  EXAM: CHEST  2 VIEW  COMPARISON:  Radiograph 12/18/2014  FINDINGS: Normal cardiac silhouette. There is bibasilar atelectasis which is increased compared to prior. Upper lungs are clear. No pneumothorax.  IMPRESSION: Increase in mild bibasilar atelectasis.   Electronically Signed   By: Suzy Bouchard M.D.   On: 12/21/2014 10:19     ASSESSMENT AND PLAN:   54 yo male w/ hx of COPD, HTN, CVA, Depression, Schizophrenia, came into hospital due for ventral hernia repair and post-operatively noted to be short of breath from COPD Exacerbation.   1. COPD Exacerbation - likely cause of pt's shortness of breath.  - cont. Iv steroids but will taper. Cont. Duonebs. Symbicort, Spiriva.  - CXR yesterday showing ?? Pneumonia - cont. Levaquin empirically. repeat cxr today bilateral atelectasis - wean oxygen as sats allow - ambulate in the room.  -incentive spirometer -curbisided Pulmonary  2. Hx of Schizophrenia - cont. Risperdal, Seroquel  3. Depression - cont. Prozac.   4. HTN - cont. Atenolol, HCTZ, Cardizem.   5. S/p Ventral Hernia repair - cont. Care as per surgery.  - cont. Pain control, diet as per surgery.   All the records are reviewed and case discussed with Care Management/Social Workerr. Management plans discussed with the patient, family and they are in agreement.  CODE STATUS: full  TOTAL TIME TAKING CARE OF THIS PATIENT: 53mnutes.    D/C  DEPENDING ON CLINICAL CONDITION. D/w  Dr Gregery Na M.D on 12/21/2014 at 1:46 PM  Between 7am to 6pm - Pager -  [phone]  After 6pm go to [email or web address] - password [email or web address] [facility]  Office  [phone]  CC: Primary care physician; Donnie Coffin, MD

## 2014-12-22 ENCOUNTER — Inpatient Hospital Stay: Payer: Medicare Other

## 2014-12-22 LAB — PLATELET COUNT: PLATELETS: 213 10*3/uL (ref 150–440)

## 2014-12-22 MED ORDER — SENNOSIDES-DOCUSATE SODIUM 8.6-50 MG PO TABS
1.0000 | ORAL_TABLET | Freq: Two times a day (BID) | ORAL | Status: DC
  Administered 2014-12-22 – 2014-12-24 (×5): 1 via ORAL
  Filled 2014-12-22 (×5): qty 1

## 2014-12-22 MED ORDER — BISACODYL 10 MG RE SUPP
10.0000 mg | Freq: Once | RECTAL | Status: AC
  Administered 2014-12-22: 10 mg via RECTAL
  Filled 2014-12-22: qty 1

## 2014-12-22 NOTE — Progress Notes (Signed)
4 Days Post-Op  Subjective: Major issue is SOB and hypoxia. pasing flatus, minimal abdominal pain reported. On HFNC presently.   Objective: Vital signs in last 24 hours: Temp:  [97.6 F (36.4 C)-97.8 F (36.6 C)] 97.6 F (36.4 C) (05/16 0827) Pulse Rate:  [71-84] 76 (05/16 0829) Resp:  [17-20] 18 (05/16 0827) BP: (140-151)/(86-95) 144/95 mmHg (05/16 0827) SpO2:  [91 %-97 %] 97 % (05/16 1127) FiO2 (%):  [55 %] 55 % (05/16 1127) Last BM Date: 12/18/14  Intake/Output from previous day: 05/15 0701 - 05/16 0700 In: 2640 [P.O.:2640] Out: 8250 [Urine:8250] Intake/Output this shift: Total I/O In: 720 [P.O.:720] Out: 600 [Urine:600]  Alert, oriented, on HFNC mask abd soft, wound clean and intact.   Lab Results:  No results for input(s): WBC, HGB, HCT, PLT in the last 72 hours. BMET No results for input(s): NA, K, CL, CO2, GLUCOSE, BUN, CREATININE, CALCIUM in the last 72 hours. PT/INR No results for input(s): LABPROT, INR in the last 72 hours. ABG  Recent Labs  12/19/14 1529  PHART 7.39  HCO3 29.1*    Studies/Results: Dg Chest 2 View  12/21/2014   CLINICAL DATA:  COPD exacerbation, short of breath and cough  EXAM: CHEST  2 VIEW  COMPARISON:  Radiograph 12/18/2014  FINDINGS: Normal cardiac silhouette. There is bibasilar atelectasis which is increased compared to prior. Upper lungs are clear. No pneumothorax.  IMPRESSION: Increase in mild bibasilar atelectasis.   Electronically Signed   By: Suzy Bouchard M.D.   On: 12/21/2014 10:19   Dg Chest Port 1 View  12/22/2014   CLINICAL DATA:  Shortness of breath.  EXAM: PORTABLE CHEST - 1 VIEW  COMPARISON:  12/21/2014 .  FINDINGS: Mediastinum hilar structures normal. Progression of bibasilar atelectatic changes and/or infiltrate are noted. Small pleural effusions. No pneumothorax. Heart size stable. No acute bony abnormality.  IMPRESSION: 1. Interim progression of bibasilar atelectasis and/or infiltrates. 2. Small pleural  effusions.   Electronically Signed   By: Marcello Moores  Register   On: 12/22/2014 07:33    Anti-infectives: Anti-infectives    Start     Dose/Rate Route Frequency Ordered Stop   12/19/14 1800  levofloxacin (LEVAQUIN) tablet 500 mg  Status:  Discontinued     500 mg Oral Daily 12/19/14 1225 12/19/14 1300   12/19/14 1800  levofloxacin (LEVAQUIN) tablet 500 mg     500 mg Oral Every 24 hours 12/19/14 1300     12/18/14 1800  levofloxacin (LEVAQUIN) IVPB 500 mg  Status:  Discontinued     500 mg 100 mL/hr over 60 Minutes Intravenous Every 24 hours 12/18/14 1556 12/19/14 1225   12/18/14 0722  ceFAZolin (ANCEF) IVPB 2 g/50 mL premix     2 g 100 mL/hr over 30 Minutes Intravenous On call to O.R. 12/18/14 0722 12/18/14 1049   12/18/14 0714  ceFAZolin (ANCEF) 2-3 GM-% IVPB SOLR    Comments:  HENRY, LOIS: cabinet override      12/18/14 0714 12/18/14 1914      Assessment/Plan: s/p Procedure(s): HERNIA REPAIR UMBILICAL ADULT (N/A) INSERTION OF MESH (N/A) SUPRA-UMBILICAL HERNIA  Making slow progress, namely issue is severe hypoxia and COPD.  Advance diet and activity as tolerated, no new surgical recommendations at this time.  [place], [place] 12/22/2014

## 2014-12-22 NOTE — Progress Notes (Signed)
[place] at [place] NAME: [name]    MR#:  [id number]  DATE OF BIRTH:  October 07, 1960  SUBJECTIVE:  Feels much better. Now on RA with sats 92%   REVIEW OF SYSTEMS:   Review of Systems  Constitutional: Negative for fever, chills and weight loss.  HENT: Negative for ear discharge, ear pain and nosebleeds.   Eyes: Negative for blurred vision, pain and discharge.  Respiratory: Positive for cough. Negative for sputum production, shortness of breath, wheezing and stridor.   Cardiovascular: Negative for chest pain, palpitations, orthopnea and PND.  Gastrointestinal: Negative for nausea, vomiting, abdominal pain and diarrhea.  Genitourinary: Negative for urgency and frequency.  Musculoskeletal: Negative for back pain and joint pain.  Neurological: Negative for sensory change, speech change, focal weakness and weakness.  Psychiatric/Behavioral: Negative for depression and hallucinations. The patient is not nervous/anxious.    Nutrition: po Tolerating PT: able to ambulate by himself Tolerating diet: yes  DRUG ALLERGIES:  No Known Allergies  VITALS:  Blood pressure 151/81, pulse 78, temperature 98.1 F (36.7 C), temperature source Oral, resp. rate 18, height '6\' 1"'$  (1.854 m), weight 114.76 kg (253 lb), SpO2 92 %.  PHYSICAL EXAMINATION:  GENERAL:  54 y.o.-year-old patient lying in the bed with no acute distress.  EYES: Pupils equal, round, reactive to light and accommodation. No scleral icterus. Extraocular muscles intact.  HEENT: Head atraumatic, normocephalic. Oropharynx and nasopharynx clear.  NECK:  Supple, no jugular venous distention. No thyroid enlargement, no tenderness.  LUNGS:coarse breath sounds bilaterally, scattered wheezing, rales,rhonchi or crepitation. No use of accessory muscles of respiration.  -decreased bs bases CARDIOVASCULAR: S1, S2 normal. No murmurs, rubs, or gallops.  ABDOMEN: Soft, nontender, distended. Bowel  sounds present. No organomegaly or mass. Midline staples + EXTREMITIES: No pedal edema, cyanosis, or clubbing.  NEUROLOGIC: Cranial nerves II through XII are intact. Muscle strength 5/5 in all extremities. Sensation intact. Gait not checked.  PSYCHIATRIC: The patient is alert and oriented x 3.  SKIN: No obvious rash, lesion, or ulcer.    LABORATORY PANEL:   CBC  Recent Labs Lab 12/18/14 0737 12/22/14 1533  WBC 9.9  --   HGB 13.7  --   HCT 40.5  --   PLT 202 213   ------------------------------------------------------------------------------------------------------------------  Chemistries   Recent Labs Lab 12/18/14 0737  NA 139  K 3.8  CL 103  CO2 30  GLUCOSE 124*  BUN 6  CREATININE 0.70  CALCIUM 9.3  AST 24  ALT 22  ALKPHOS 73  BILITOT 0.4   ------------------------------------------------------------------------------------------------------------------  Cardiac Enzymes No results for input(s): TROPONINI in the last 168 hours. ------------------------------------------------------------------------------------------------------------------  RADIOLOGY:  Dg Chest 2 View  12/21/2014   CLINICAL DATA:  COPD exacerbation, short of breath and cough  EXAM: CHEST  2 VIEW  COMPARISON:  Radiograph 12/18/2014  FINDINGS: Normal cardiac silhouette. There is bibasilar atelectasis which is increased compared to prior. Upper lungs are clear. No pneumothorax.  IMPRESSION: Increase in mild bibasilar atelectasis.   Electronically Signed   By: Suzy Bouchard M.D.   On: 12/21/2014 10:19   Dg Chest Port 1 View  12/22/2014   CLINICAL DATA:  Shortness of breath.  EXAM: PORTABLE CHEST - 1 VIEW  COMPARISON:  12/21/2014 .  FINDINGS: Mediastinum hilar structures normal. Progression of bibasilar atelectatic changes and/or infiltrate are noted. Small pleural effusions. No pneumothorax. Heart size stable. No acute bony abnormality.  IMPRESSION: 1. Interim progression of bibasilar atelectasis  and/or infiltrates.  2. Small pleural effusions.   Electronically Signed   By: Marcello Moores  Register   On: 12/22/2014 07:33     ASSESSMENT AND PLAN:   54 yo male w/ hx of COPD, HTN, CVA, Depression, Schizophrenia, came into hospital due for ventral hernia repair and post-operatively noted to be short of breath from COPD Exacerbation.   1. COPD Exacerbation - likely cause of pt's shortness of breath.  - cont. Iv steroids but will taper. Cont. Duonebs. Symbicort, Spiriva.  - cont. Levaquin empirically. repeat cxr today bilateral atelectasis - sats now 92-93% on RA - ambulate in the room -incentive spirometer -appreciate Pulmonary input  2. Hx of Schizophrenia - cont. Risperdal, Seroquel  3. Depression - cont. Prozac.   4. HTN - cont. Atenolol, HCTZ, Cardizem.   5. S/p Ventral Hernia repair - cont. Care as per surgery.  - cont. Pain control, diet as per surgery.   All the records are reviewed and case discussed with Care Management/Social Workerr. Management plans discussed with the patient, family and they are in agreement.  CODE STATUS: full  TOTAL TIME TAKING CARE OF THIS PATIENT: 92mnutes.    D/C in am if continues to improve    Lazar Tierce M.D on 12/22/2014 at 4:23 PM  Between 7am to 6pm - Pager - [phone]  After 6pm go to [email or web address] - password [email or web address]  Office  [phone] CC: Primary care physician; ADonnie Coffin MD

## 2014-12-23 MED ORDER — PREDNISONE 20 MG PO TABS: 20.0000 mg | ORAL_TABLET | Freq: Every day | ORAL | Status: DC

## 2014-12-23 MED ORDER — PREDNISONE 20 MG PO TABS: 40.0000 mg | ORAL_TABLET | Freq: Every day | ORAL | Status: DC

## 2014-12-23 MED ORDER — PREDNISONE 50 MG PO TABS
50.0000 mg | ORAL_TABLET | Freq: Every day | ORAL | Status: DC
  Administered 2014-12-23 – 2014-12-24 (×2): 50 mg via ORAL
  Filled 2014-12-23 (×2): qty 1

## 2014-12-23 MED ORDER — PREDNISONE 20 MG PO TABS: 30.0000 mg | ORAL_TABLET | Freq: Every day | ORAL | Status: DC

## 2014-12-23 MED ORDER — PREDNISONE 10 MG PO TABS: 10.0000 mg | ORAL_TABLET | Freq: Every day | ORAL | Status: DC

## 2014-12-23 MED ORDER — OXYCODONE-ACETAMINOPHEN 5-325 MG PO TABS: 1.0000 | ORAL_TABLET | ORAL | Status: DC | PRN

## 2014-12-23 MED ORDER — LEVOFLOXACIN 500 MG PO TABS: 500.0000 mg | ORAL_TABLET | ORAL | Status: AC

## 2014-12-23 MED ORDER — PREDNISONE 50 MG PO TABS: ORAL_TABLET | ORAL | Status: DC

## 2014-12-23 NOTE — Care Management (Signed)
Spoke with Dr Posey Pronto this am concerning patient discharge. [facility] will return to [facility]. Informed CSW Shela Leff concerning this.  Patient will need signed FL2 New Scripts and discharge summary from Dr Rexene Edison. Dr Rexene Edison contacted and informed. CSW following.

## 2014-12-23 NOTE — Progress Notes (Signed)
Attempted to d/c pt to family care home however when calling to arrange transport was told by [name] that she was by herself and unable to leave to pick her up.  Arranged for taxi voucher, however was then told there as no way to pick up patient's pain medication.  Dr. Rexene Edison contacted and d/c suspended until tomorrow.

## 2014-12-23 NOTE — Progress Notes (Signed)
[facility] [place] NAME: [name]    MR#:  [id number]  DATE OF BIRTH:  27-Jan-1961  SUBJECTIVE:  Feels much better. Now on RA with sats 92%   REVIEW OF SYSTEMS:   Review of Systems  Constitutional: Negative for fever, chills and weight loss.  HENT: Negative for ear discharge, ear pain and nosebleeds.   Eyes: Negative for blurred vision, pain and discharge.  Respiratory: Negative for cough, sputum production, shortness of breath, wheezing and stridor.   Cardiovascular: Negative for chest pain, palpitations, orthopnea and PND.  Gastrointestinal: Negative for nausea, vomiting, abdominal pain and diarrhea.  Genitourinary: Negative for urgency and frequency.  Musculoskeletal: Negative for back pain and joint pain.  Neurological: Negative for sensory change, speech change, focal weakness and weakness.  Psychiatric/Behavioral: Negative for depression and hallucinations. The patient is not nervous/anxious.   All other systems reviewed and are negative.  Nutrition: po Tolerating PT: able to ambulate by himself Tolerating diet: yes  DRUG ALLERGIES:  No Known Allergies  VITALS:  Blood pressure 142/98, pulse 78, temperature 97.6 F (36.4 C), temperature source Oral, resp. rate 18, height '6\' 1"'$  (1.854 m), weight 114.76 kg (253 lb), SpO2 90 %.  PHYSICAL EXAMINATION:  GENERAL:  54 y.o.-year-old patient lying in the bed with no acute distress.  EYES: Pupils equal, round, reactive to light and accommodation. No scleral icterus. Extraocular muscles intact.  HEENT: Head atraumatic, normocephalic. Oropharynx and nasopharynx clear.  NECK:  Supple, no jugular venous distention. No thyroid enlargement, no tenderness.  LUNGS:normal breath sounds bilaterally, no wheezing, rales,rhonchi or crepitation. No use of accessory muscles of respiration.  -decreased bs bases CARDIOVASCULAR: S1, S2 normal. No murmurs, rubs, or gallops.  ABDOMEN: Soft,  nontender, distended. Bowel sounds present. No organomegaly or mass. Midline staples + EXTREMITIES: No pedal edema, cyanosis, or clubbing.  NEUROLOGIC: Cranial nerves II through XII are intact. Muscle strength 5/5 in all extremities. Sensation intact. Gait not checked.  PSYCHIATRIC: The patient is alert and oriented x 3.  SKIN: No obvious rash, lesion, or ulcer.    LABORATORY PANEL:   CBC  Recent Labs Lab 12/18/14 0737 12/22/14 1533  WBC 9.9  --   HGB 13.7  --   HCT 40.5  --   PLT 202 213   ------------------------------------------------------------------------------------------------------------------  Chemistries   Recent Labs Lab 12/18/14 0737  NA 139  K 3.8  CL 103  CO2 30  GLUCOSE 124*  BUN 6  CREATININE 0.70  CALCIUM 9.3  AST 24  ALT 22  ALKPHOS 73  BILITOT 0.4   ------------------------------------------------------------------------------------------------------------------  Cardiac Enzymes No results for input(s): TROPONINI in the last 168 hours. ------------------------------------------------------------------------------------------------------------------  RADIOLOGY:  Dg Chest 2 View  12/21/2014   CLINICAL DATA:  COPD exacerbation, short of breath and cough  EXAM: CHEST  2 VIEW  COMPARISON:  Radiograph 12/18/2014  FINDINGS: Normal cardiac silhouette. There is bibasilar atelectasis which is increased compared to prior. Upper lungs are clear. No pneumothorax.  IMPRESSION: Increase in mild bibasilar atelectasis.   Electronically Signed   By: Suzy Bouchard M.D.   On: 12/21/2014 10:19   Dg Chest Port 1 View  12/22/2014   CLINICAL DATA:  Shortness of breath.  EXAM: PORTABLE CHEST - 1 VIEW  COMPARISON:  12/21/2014 .  FINDINGS: Mediastinum hilar structures normal. Progression of bibasilar atelectatic changes and/or infiltrate are noted. Small pleural effusions. No pneumothorax. Heart size stable. No acute bony abnormality.  IMPRESSION: 1. Interim  progression  of bibasilar atelectasis and/or infiltrates. 2. Small pleural effusions.   Electronically Signed   By: Marcello Moores  Register   On: 12/22/2014 07:33     ASSESSMENT AND PLAN:   54 yo male w/ hx of COPD, HTN, CVA, Depression, Schizophrenia, came into hospital due for ventral hernia repair and post-operatively noted to be short of breath from COPD Exacerbation.   1. COPD Exacerbation - likely cause of pt's shortness of breath.  - steroid taper. Cont. Duonebs. Symbicort, Spiriva.  - cont. Levaquin empirically. repeat cxr today bilateral atelectasis - sats now 92-93% on RA -incentive spirometer -appreciate Pulmonary input -OK to go home from Medical standpoint. D/w Dr Rexene Edison  2. Hx of Schizophrenia - cont. Risperdal, Seroquel  3. Depression - cont. Prozac.   4. HTN - cont. Atenolol, HCTZ, Cardizem.   5. S/p Ventral Hernia repair - cont. Care as per surgery.  - cont. Pain control, diet as per surgery.   All the records are reviewed and case discussed with Care Management/Social Workerr. Management plans discussed with the patient, family and they are in agreement.  CODE STATUS: full  TOTAL TIME TAKING CARE OF THIS PATIENT: 35mnutes.    D/C in am if continues to improve    Damascus Feldpausch M.D on 12/23/2014 at 7:59 AM  Between 7am to 6pm - Pager - [phone]  After 6pm go to [email or web address] - password [email or web address]  Office  [phone] CC: Primary care physician; ADonnie Coffin MD

## 2014-12-23 NOTE — Progress Notes (Signed)
Called and spoke with [name] at the pt's care home. Waiting on a taxi to come and pick him up.

## 2014-12-23 NOTE — Discharge Summary (Signed)
Discharge Summary  Discharge Diagnosis  Incarcerated umbilical and ventral hernia s/p repair with mesh 5/12  Past Medical History  Diagnosis Date  . Hypertension   . Stroke   . COPD (chronic obstructive pulmonary disease)   . Shortness of breath dyspnea   . Pneumonia   . Depression   . Schizophrenia   . History of hiatal hernia    Reason for admission:   Incarcerated ventral and umbilical hernia.  Hospital course as follows:  [name] was admitted from emergency room for incarcerated umbilical and ventral hernia.  He underwent unremarkable repair.  Postoperatively he was given IV pain meds and later progressed to PO pain meds.  Similarly, his diet was advanced as his bowel function returned.  At time of discharge, he was tolerating a regular diet with good PO pain control.    Postoperatively, he had postoperative respiratory distress, which was managed by medicine service with nebulizers and bipap initially.  At time of discharge, he was without requirement for oxygen.      Medication List    TAKE these medications        acetaminophen 325 MG tablet  Commonly known as:  TYLENOL  Take 650 mg by mouth 2 (two) times daily as needed for mild pain.     albuterol (2.5 MG/3ML) 0.083% nebulizer solution  Commonly known as:  PROVENTIL  Take 2.5 mg by nebulization every 4 (four) hours as needed for wheezing or shortness of breath.     albuterol 108 (90 BASE) MCG/ACT inhaler  Commonly known as:  PROVENTIL HFA;VENTOLIN HFA  Inhale 2 puffs into the lungs every 4 (four) hours as needed for wheezing or shortness of breath.     aspirin 81 MG tablet  Take 81 mg by mouth daily.     atenolol 50 MG tablet  Commonly known as:  TENORMIN  Take 50 mg by mouth daily.     budesonide-formoterol 160-4.5 MCG/ACT inhaler  Commonly known as:  SYMBICORT  Inhale 2 puffs into the lungs 2 (two) times daily.     cholecalciferol 1000 UNITS tablet  Commonly known as:  VITAMIN D  Take 1,000 Units by  mouth daily.     clopidogrel 75 MG tablet  Commonly known as:  PLAVIX  Take 75 mg by mouth daily.     diltiazem 180 MG 24 hr capsule  Commonly known as:  CARDIZEM CD  Take 180 mg by mouth daily.     docusate sodium 100 MG capsule  Commonly known as:  COLACE  Take 100 mg by mouth 2 (two) times daily as needed for mild constipation.     FLUoxetine 20 MG capsule  Commonly known as:  PROZAC  Take 20 mg by mouth daily.     guaiFENesin 100 MG/5ML liquid  Commonly known as:  ROBITUSSIN  Take 200 mg by mouth as needed for cough (every 4 hours as needed for cough).     guaiFENesin-dextromethorphan 100-10 MG/5ML syrup  Commonly known as:  ROBITUSSIN DM  Take 5 mLs by mouth every 4 (four) hours as needed for cough.     hydrochlorothiazide 12.5 MG tablet  Commonly known as:  HYDRODIURIL  Take 12.5 mg by mouth daily.     ipratropium-albuterol 0.5-2.5 (3) MG/3ML Soln  Commonly known as:  DUONEB  Take 3 mLs by nebulization every 4 (four) hours as needed (every 4 to 6 hours as needed for shortnes of breath or wheeziing).     ketoconazole 2 % shampoo  Commonly known as:  NIZORAL  Apply 1 application topically 2 (two) times a week.     levofloxacin 500 MG tablet  Commonly known as:  LEVAQUIN  Take 1 tablet (500 mg total) by mouth daily.     lovastatin 20 MG tablet  Commonly known as:  MEVACOR  Take 20 mg by mouth at bedtime.     multivitamin tablet  Take 1 tablet by mouth daily.     oxyCODONE-acetaminophen 5-325 MG per tablet  Commonly known as:  PERCOCET/ROXICET  Take 1 tablet by mouth every 4 (four) hours as needed for moderate pain.     pantoprazole 40 MG tablet  Commonly known as:  PROTONIX  Take 40 mg by mouth daily.     potassium chloride SA 20 MEQ tablet  Commonly known as:  K-DUR,KLOR-CON  Take 20 mEq by mouth once.     predniSONE 50 MG tablet  Commonly known as:  DELTASONE  Start with 50 mg daily taper by 10 mg daily then stop     promethazine 25 MG tablet   Commonly known as:  PHENERGAN  Take 25 mg by mouth every 6 (six) hours as needed for nausea or vomiting.     QUEtiapine 200 MG tablet  Commonly known as:  SEROQUEL  Take 200 mg by mouth at bedtime.     risperiDONE microspheres 37.5 MG injection  Commonly known as:  RISPERDAL CONSTA  Inject 37.5 mg into the muscle every 14 (fourteen) days.     tiotropium 18 MCG inhalation capsule  Commonly known as:  SPIRIVA  Place 18 mcg into inhaler and inhale daily.     trihexyphenidyl 2 MG tablet  Commonly known as:  ARTANE  Take 2 mg by mouth 2 (two) times daily.        Discharge instructions.   [name] was to follow up with Dr. Burt Knack in approx 1 week.  He is to call or return to ER if he has increased pain, nausea/vomiting, drainage from incision.

## 2014-12-23 NOTE — Discharge Instructions (Addendum)
[name] is to call or return to ER if he has increased pain, nausea/vomiting, fever of 100.4 or higher or drainage from incision.

## 2014-12-23 NOTE — Progress Notes (Signed)
Patient is refusing his nebulized Duo-Neb as well as wearing any oxygen.  The nurses aid checked vitals revealing an 02 saturation of 87%.  I talked with Mr. Mathes about wearing 02 and taking a breathing treatment, but he would rather eat and did not have interest in wearing oxygen.  RN is aware.

## 2014-12-23 NOTE — Clinical Social Work Note (Signed)
Clinical Social Work Assessment  Patient Details  Name: [name] MRN: [id number] Date of Birth: November 21, 1960  Date of referral:  12/23/14               Reason for consult:  Facility Placement                Permission sought to share information with:  Facility Art therapist granted to share information::  Yes, Verbal Permission Granted  Name::        Agency::     Relationship::     Contact Information:     Housing/Transportation Living arrangements for the past 2 months:  Group Home Source of Information:  Patient Patient Interpreter Needed:  None Criminal Activity/Legal Involvement Pertinent to Current Situation/Hospitalization:  No - Comment as needed Significant Relationships:  None Lives with:  Facility Resident Do you feel safe going back to the place where you live?  Yes Need for family participation in patient care:  No (Coment)  Care giving concerns:  none   Facilities manager / plan:  Physician informed CSW that patient is from a group home and that he is ready for discharge today. CSW spoke with patient who is happy to return today.   Employment status:  Disabled (Comment on whether or not currently receiving Disability) Insurance information:    PT Recommendations:  Not assessed at this time Information / Referral to community resources:     Patient/Family's Response to care:  Happy   Patient/Family's Understanding of and Emotional Response to Diagnosis, Current Treatment, and Prognosis:  Verbalized understanding and agreement with discharge  Emotional Assessment Appearance:    Attitude/Demeanor/Rapport:   (pleasant and cooperative) Affect (typically observed):    Orientation:    Alcohol / Substance use:    Psych involvement (Current and /or in the community):  No (Comment)  Discharge Needs  Concerns to be addressed:  No discharge needs identified Readmission within the last 30 days:  No Current discharge risk:  None Barriers  to Discharge:  No Barriers Identified   Shela Leff, [place] 12/23/2014, 2:03 PM

## 2014-12-23 NOTE — Progress Notes (Signed)
Surgery Progress Note  S: No acute issues.   O: AF/VSS, good uop GEN: NAD/A&Ox3 ABD: soft, min tender, nondistended, incision c/d/i  A/P 54 yo s/p repair of VH, doing well - await bowel function - dispo soon

## 2014-12-24 NOTE — Progress Notes (Signed)
Surgery Progress Note  S: Doing well.  No acute issues O: AF/VSS, good uop GEN: NAD/A&Ox3 ABD: soft, min tender, nondistended  A/P 54 yo s/p repair of umbilical and ventral hernia doing well - dispo

## 2014-12-24 NOTE — Progress Notes (Signed)
Michela Pitcher he was going home today and would get a bath then.

## 2014-12-24 NOTE — Care Management (Signed)
Discharge today to group home.

## 2014-12-24 NOTE — Progress Notes (Signed)
Called and told pt's group home about discharge orders. [name] informed me she could pick up [name] around 1000. I also reported that Mr. Gillison needs to start his prednisone taper tomorrow at 40 mg.

## 2014-12-24 NOTE — Progress Notes (Signed)
Pt with c/o of abdominal discomfort at surgical site. Pain rated 5/10. Prn pain med given per M.D. Order (see mar). No adverse events at this time. Will continue to monitor per M.D. Order. Pt ambulating without difficulty, call light and phone in reach.

## 2015-01-07 ENCOUNTER — Encounter: Payer: Self-pay | Admitting: *Deleted

## 2015-01-07 ENCOUNTER — Ambulatory Visit (INDEPENDENT_AMBULATORY_CARE_PROVIDER_SITE_OTHER): Payer: Medicare Other | Admitting: Surgery

## 2015-01-07 ENCOUNTER — Encounter: Payer: Self-pay | Admitting: Surgery

## 2015-01-07 VITALS — BP 122/90 | HR 82 | Temp 98.1°F | Resp 20 | Ht 73.0 in | Wt 273.0 lb

## 2015-01-07 DIAGNOSIS — K439 Ventral hernia without obstruction or gangrene: Secondary | ICD-10-CM

## 2015-01-07 NOTE — Progress Notes (Signed)
Outpatient postop visit  01/07/2015  [name] is an 54 y.o. male.    Procedure: Patient is status post ventral hernia repair with mesh. He is having no problems at this time minimal pain only. He is not noticing any drainage from his wound.  Medications reviewed.    Physical Exam:  BP 122/90 mmHg  Pulse 82  Temp(Src) 98.1 F (36.7 C) (Oral)  Resp 20  Ht '6\' 1"'$  (1.854 m)  Wt 123.832 kg (273 lb)  BMI 36.03 kg/m2    PE: Wound healing well no erythema no drainage staples are removed.    Assessment/Plan:  Patient doing very well he is requested a refill of his analgesia . Recommend follow up on an as-needed basis.  Florene Glen, MD, FACS

## 2015-01-07 NOTE — Patient Instructions (Signed)
Return to the clinic on an as-needed basis.

## 2015-01-09 ENCOUNTER — Other Ambulatory Visit: Payer: Self-pay | Admitting: *Deleted

## 2015-01-21 NOTE — Addendum Note (Signed)
Addendum  created 01/21/15 1550 by Alvin Critchley, MD   Modules edited: Anesthesia Events, Narrator   Narrator:  Narrator: Event Log Edited

## 2017-01-16 ENCOUNTER — Inpatient Hospital Stay
Admission: EM | Admit: 2017-01-16 | Discharge: 2017-01-21 | DRG: 190 | Disposition: A | Discharge status: Home / Self Care | Payer: Medicare Other | Attending: Internal Medicine | Admitting: Internal Medicine

## 2017-01-16 ENCOUNTER — Emergency Department: Payer: Medicare Other

## 2017-01-16 ENCOUNTER — Encounter: Payer: Self-pay | Admitting: Emergency Medicine

## 2017-01-16 DIAGNOSIS — F329 Major depressive disorder, single episode, unspecified: Secondary | ICD-10-CM | POA: Diagnosis present

## 2017-01-16 DIAGNOSIS — R0902 Hypoxemia: Secondary | ICD-10-CM

## 2017-01-16 DIAGNOSIS — Z7951 Long term (current) use of inhaled steroids: Secondary | ICD-10-CM

## 2017-01-16 DIAGNOSIS — J209 Acute bronchitis, unspecified: Secondary | ICD-10-CM | POA: Diagnosis present

## 2017-01-16 DIAGNOSIS — J441 Chronic obstructive pulmonary disease with (acute) exacerbation: Secondary | ICD-10-CM | POA: Diagnosis not present

## 2017-01-16 DIAGNOSIS — F209 Schizophrenia, unspecified: Secondary | ICD-10-CM | POA: Diagnosis present

## 2017-01-16 DIAGNOSIS — J440 Chronic obstructive pulmonary disease with acute lower respiratory infection: Secondary | ICD-10-CM | POA: Diagnosis present

## 2017-01-16 DIAGNOSIS — F17210 Nicotine dependence, cigarettes, uncomplicated: Secondary | ICD-10-CM | POA: Diagnosis present

## 2017-01-16 DIAGNOSIS — Z8673 Personal history of transient ischemic attack (TIA), and cerebral infarction without residual deficits: Secondary | ICD-10-CM

## 2017-01-16 DIAGNOSIS — I10 Essential (primary) hypertension: Secondary | ICD-10-CM | POA: Diagnosis present

## 2017-01-16 DIAGNOSIS — Z79899 Other long term (current) drug therapy: Secondary | ICD-10-CM

## 2017-01-16 DIAGNOSIS — E785 Hyperlipidemia, unspecified: Secondary | ICD-10-CM | POA: Diagnosis present

## 2017-01-16 DIAGNOSIS — J449 Chronic obstructive pulmonary disease, unspecified: Secondary | ICD-10-CM

## 2017-01-16 DIAGNOSIS — J9601 Acute respiratory failure with hypoxia: Secondary | ICD-10-CM | POA: Diagnosis not present

## 2017-01-16 LAB — CBC
HEMATOCRIT: 42.1 % (ref 40.0–52.0)
Hemoglobin: 14.6 g/dL (ref 13.0–18.0)
MCH: 32 pg (ref 26.0–34.0)
MCHC: 34.6 g/dL (ref 32.0–36.0)
MCV: 92.4 fL (ref 80.0–100.0)
PLATELETS: 202 10*3/uL (ref 150–440)
RBC: 4.56 MIL/uL (ref 4.40–5.90)
RDW: 15 % — AB (ref 11.5–14.5)
WBC: 9.3 10*3/uL (ref 3.8–10.6)

## 2017-01-16 LAB — BASIC METABOLIC PANEL
Anion gap: 7 (ref 5–15)
BUN: 9 mg/dL (ref 6–20)
CHLORIDE: 104 mmol/L (ref 101–111)
CO2: 29 mmol/L (ref 22–32)
CREATININE: 0.69 mg/dL (ref 0.61–1.24)
Calcium: 9.6 mg/dL (ref 8.9–10.3)
GFR calc Af Amer: >60 mL/min (ref >60)
GFR calc non Af Amer: >60 mL/min (ref >60)
GLUCOSE: 99 mg/dL (ref 65–99)
POTASSIUM: 3.6 mmol/L (ref 3.5–5.1)
Sodium: 140 mmol/L (ref 135–145)

## 2017-01-16 LAB — MAGNESIUM: Magnesium: 1.9 mg/dL (ref 1.7–2.4)

## 2017-01-16 LAB — TROPONIN I: Troponin I: <0.03 ng/mL (ref <0.03)

## 2017-01-16 MED ORDER — ACETAMINOPHEN 650 MG RE SUPP: 650.0000 mg | Freq: Four times a day (QID) | RECTAL | Status: DC | PRN

## 2017-01-16 MED ORDER — METHYLPREDNISOLONE SODIUM SUCC 125 MG IJ SOLR
60.0000 mg | Freq: Two times a day (BID) | INTRAMUSCULAR | Status: DC
  Administered 2017-01-16 – 2017-01-17 (×2): 60 mg via INTRAVENOUS
  Filled 2017-01-16 (×2): qty 2

## 2017-01-16 MED ORDER — AZITHROMYCIN 500 MG IV SOLR
500.0000 mg | Freq: Once | INTRAVENOUS | Status: AC
  Administered 2017-01-16: 500 mg via INTRAVENOUS
  Filled 2017-01-16: qty 500

## 2017-01-16 MED ORDER — ENOXAPARIN SODIUM 40 MG/0.4ML ~~LOC~~ SOLN
40.0000 mg | SUBCUTANEOUS | Status: DC
  Administered 2017-01-16 – 2017-01-20 (×5): 40 mg via SUBCUTANEOUS
  Filled 2017-01-16 (×6): qty 0.4

## 2017-01-16 MED ORDER — HYDROCHLOROTHIAZIDE 25 MG PO TABS
12.5000 mg | ORAL_TABLET | Freq: Every day | ORAL | Status: DC
  Administered 2017-01-17 – 2017-01-21 (×5): 12.5 mg via ORAL
  Filled 2017-01-16 (×3): qty 1
  Filled 2017-01-16: qty 0.5
  Filled 2017-01-16: qty 1

## 2017-01-16 MED ORDER — ALBUTEROL SULFATE (2.5 MG/3ML) 0.083% IN NEBU
5.0000 mg | INHALATION_SOLUTION | Freq: Once | RESPIRATORY_TRACT | Status: AC
  Administered 2017-01-16: 5 mg via RESPIRATORY_TRACT

## 2017-01-16 MED ORDER — SODIUM CHLORIDE 0.9% FLUSH
3.0000 mL | Freq: Two times a day (BID) | INTRAVENOUS | Status: DC
  Administered 2017-01-16 – 2017-01-21 (×10): 3 mL via INTRAVENOUS

## 2017-01-16 MED ORDER — DEXTROSE 5 % IV SOLN
1.0000 g | Freq: Once | INTRAVENOUS | Status: AC
  Administered 2017-01-16: 1 g via INTRAVENOUS
  Filled 2017-01-16: qty 10

## 2017-01-16 MED ORDER — ONDANSETRON HCL 4 MG PO TABS: 4.0000 mg | ORAL_TABLET | Freq: Four times a day (QID) | ORAL | Status: DC | PRN

## 2017-01-16 MED ORDER — MIRTAZAPINE 15 MG PO TABS
30.0000 mg | ORAL_TABLET | Freq: Every day | ORAL | Status: DC
Start: 2017-01-16 — End: 2017-01-21
  Administered 2017-01-16 – 2017-01-20 (×5): 30 mg via ORAL
  Filled 2017-01-16 (×5): qty 2

## 2017-01-16 MED ORDER — POLYETHYLENE GLYCOL 3350 17 G PO PACK: 17.0000 g | PACK | Freq: Every day | ORAL | Status: DC | PRN

## 2017-01-16 MED ORDER — IPRATROPIUM-ALBUTEROL 0.5-2.5 (3) MG/3ML IN SOLN
3.0000 mL | Freq: Once | RESPIRATORY_TRACT | Status: AC
  Administered 2017-01-16: 3 mL via RESPIRATORY_TRACT
  Filled 2017-01-16: qty 3

## 2017-01-16 MED ORDER — ALBUTEROL SULFATE (2.5 MG/3ML) 0.083% IN NEBU
INHALATION_SOLUTION | RESPIRATORY_TRACT | Status: AC
  Filled 2017-01-16: qty 6

## 2017-01-16 MED ORDER — POLYETHYLENE GLYCOL 3350 17 G PO PACK
17.0000 g | PACK | Freq: Every day | ORAL | Status: DC
  Administered 2017-01-17 – 2017-01-21 (×4): 17 g via ORAL
  Filled 2017-01-16 (×5): qty 1

## 2017-01-16 MED ORDER — SODIUM CHLORIDE 0.9 % IV SOLN: 250.0000 mL | INTRAVENOUS | Status: DC | PRN

## 2017-01-16 MED ORDER — MOMETASONE FURO-FORMOTEROL FUM 200-5 MCG/ACT IN AERO
2.0000 | INHALATION_SPRAY | Freq: Two times a day (BID) | RESPIRATORY_TRACT | Status: DC
  Administered 2017-01-16 – 2017-01-21 (×10): 2 via RESPIRATORY_TRACT
  Filled 2017-01-16: qty 8.8

## 2017-01-16 MED ORDER — SODIUM CHLORIDE 0.9% FLUSH: 3.0000 mL | INTRAVENOUS | Status: DC | PRN

## 2017-01-16 MED ORDER — IPRATROPIUM-ALBUTEROL 0.5-2.5 (3) MG/3ML IN SOLN
3.0000 mL | Freq: Four times a day (QID) | RESPIRATORY_TRACT | Status: DC
  Administered 2017-01-16 – 2017-01-21 (×16): 3 mL via RESPIRATORY_TRACT
  Filled 2017-01-16 (×17): qty 3

## 2017-01-16 MED ORDER — ALBUTEROL SULFATE (2.5 MG/3ML) 0.083% IN NEBU: 2.5000 mg | INHALATION_SOLUTION | RESPIRATORY_TRACT | Status: DC | PRN

## 2017-01-16 MED ORDER — AMANTADINE HCL 100 MG PO CAPS
100.0000 mg | ORAL_CAPSULE | Freq: Two times a day (BID) | ORAL | Status: DC
Start: 2017-01-16 — End: 2017-01-21
  Administered 2017-01-16 – 2017-01-21 (×10): 100 mg via ORAL
  Filled 2017-01-16 (×10): qty 1

## 2017-01-16 MED ORDER — AZITHROMYCIN 500 MG PO TABS
500.0000 mg | ORAL_TABLET | Freq: Every day | ORAL | Status: AC
Start: 2017-01-17 — End: 2017-01-19
  Administered 2017-01-17 – 2017-01-19 (×3): 500 mg via ORAL
  Filled 2017-01-16 (×3): qty 1

## 2017-01-16 MED ORDER — ONDANSETRON HCL 4 MG/2ML IJ SOLN: 4.0000 mg | Freq: Four times a day (QID) | INTRAMUSCULAR | Status: DC | PRN

## 2017-01-16 MED ORDER — NICOTINE 14 MG/24HR TD PT24
14.0000 mg | MEDICATED_PATCH | Freq: Every day | TRANSDERMAL | Status: DC
  Administered 2017-01-17 – 2017-01-21 (×6): 14 mg via TRANSDERMAL
  Filled 2017-01-16 (×7): qty 1

## 2017-01-16 MED ORDER — QUETIAPINE FUMARATE 100 MG PO TABS
200.0000 mg | ORAL_TABLET | Freq: Every day | ORAL | Status: DC
  Administered 2017-01-16 – 2017-01-20 (×5): 200 mg via ORAL
  Filled 2017-01-16 (×3): qty 8
  Filled 2017-01-16 (×2): qty 2

## 2017-01-16 MED ORDER — GUAIFENESIN ER 600 MG PO TB12
600.0000 mg | ORAL_TABLET | Freq: Two times a day (BID) | ORAL | Status: DC
  Administered 2017-01-16 – 2017-01-21 (×10): 600 mg via ORAL
  Filled 2017-01-16 (×10): qty 1

## 2017-01-16 MED ORDER — ACETAMINOPHEN 325 MG PO TABS: 650.0000 mg | ORAL_TABLET | Freq: Four times a day (QID) | ORAL | Status: DC | PRN

## 2017-01-16 MED ORDER — GABAPENTIN 300 MG PO CAPS
300.0000 mg | ORAL_CAPSULE | Freq: Three times a day (TID) | ORAL | Status: DC
  Administered 2017-01-16 – 2017-01-21 (×14): 300 mg via ORAL
  Filled 2017-01-16 (×14): qty 1

## 2017-01-16 MED ORDER — PRAVASTATIN SODIUM 40 MG PO TABS
40.0000 mg | ORAL_TABLET | Freq: Every day | ORAL | Status: DC
  Administered 2017-01-16 – 2017-01-20 (×5): 40 mg via ORAL
  Filled 2017-01-16 (×2): qty 1
  Filled 2017-01-16 (×3): qty 2

## 2017-01-16 NOTE — ED Provider Notes (Signed)
[facility] Emergency Department Provider Note    First MD Initiated Contact with Patient 01/16/17 0901     (approximate)  I have reviewed the triage vital signs and the nursing notes.   HISTORY  Chief Complaint Chest Pain    HPI [name] is a 56 y.o. male with a history of COPD presents with worsening shortness of breath. Patient states that he did have some midsternal chest pain associated with shortness of breath. Patient does not wear any oxygen at home. States he denies having more productive cough and feels this is likely pneumonia that he had several years ago. Denies any fevers. States he's been using his home nebulizer treatments without any improvement. Denies any orthopnea. No lower extremity swelling.   Past Medical History:  Diagnosis Date  . COPD (chronic obstructive pulmonary disease) ([place])   . Depression   . History of hiatal hernia   . Hypertension   . Pneumonia   . Schizophrenia ([place])   . Shortness of breath dyspnea   . Stroke [facility])    Family History  Problem Relation Age of Onset  . Asthma Mother   . Hypertension Father    Past Surgical History:  Procedure Laterality Date  . HERNIA REPAIR    . INSERTION OF MESH N/A 12/18/2014   Procedure: INSERTION OF MESH;  Surgeon: Florene Glen, MD;  Location: [facility] ORS;  Service: General;  Laterality: N/A;  . SUPRA-UMBILICAL HERNIA  9/73/5329   Procedure: SUPRA-UMBILICAL HERNIA;  Surgeon: Florene Glen, MD;  Location: [facility] ORS;  Service: General;;  . UMBILICAL HERNIA REPAIR N/A 12/18/2014   Procedure: HERNIA REPAIR UMBILICAL ADULT;  Surgeon: Florene Glen, MD;  Location: [facility] ORS;  Service: General;  Laterality: N/A;   Patient Active Problem List   Diagnosis Date Noted  . COPD exacerbation ([place]) 01/16/2017  . Ventral hernia 12/18/2014  . Hematoma of leg 01/30/2014      Prior to Admission medications   Medication Sig Start Date End Date Taking? Authorizing Provider    amantadine (SYMMETREL) 100 MG capsule Take 100 mg by mouth 2 (two) times daily.   Yes [provider]  budesonide-formoterol (SYMBICORT) 160-4.5 MCG/ACT inhaler Inhale 2 puffs into the lungs 2 (two) times daily.   Yes [provider]  docusate sodium (COLACE) 100 MG capsule Take 100 mg by mouth 2 (two) times daily as needed for mild constipation.   Yes [provider]  gabapentin (NEURONTIN) 300 MG capsule Take 300 mg by mouth 3 (three) times daily.   Yes [provider]  guaiFENesin (MUCINEX) 600 MG 12 hr tablet Take 600 mg by mouth 2 (two) times daily.   Yes [provider]  hydrochlorothiazide (HYDRODIURIL) 12.5 MG tablet Take 12.5 mg by mouth daily.   Yes [provider]  lovastatin (MEVACOR) 20 MG tablet Take 20 mg by mouth at bedtime.   Yes [provider]  Melatonin 5 MG TABS Take 1 tablet by mouth at bedtime as needed.   Yes [provider]  mirtazapine (REMERON) 30 MG tablet Take 30 mg by mouth at bedtime.   Yes [provider]  Omega-3 Fatty Acids (FISH OIL) 1000 MG CPDR Take by mouth.   Yes [provider]  Paliperidone Palmitate (INVEGA SUSTENNA IM) every 3 (three) months.  12/30/14  Yes [provider]  polyethylene glycol (MIRALAX / GLYCOLAX) packet Take 17 g by mouth daily.   Yes [provider]  QUEtiapine (SEROQUEL) 200  MG tablet Take 200 mg by mouth at bedtime.   Yes [provider]  Vitamin D, Ergocalciferol, (DRISDOL) 50000 UNITS CAPS capsule Take by mouth.   Yes [provider]  acetaminophen (TYLENOL) 325 MG tablet Take 650 mg by mouth 2 (two) times daily as needed for mild pain.    [provider]  albuterol (PROVENTIL HFA;VENTOLIN HFA) 108 (90 BASE) MCG/ACT inhaler Inhale 2 puffs into the lungs every 4 (four) hours as needed for wheezing or shortness of breath.    [provider]  ipratropium-albuterol (DUONEB) 0.5-2.5 (3) MG/3ML SOLN  Take 3 mLs by nebulization every 4 (four) hours as needed (every 4 to 6 hours as needed for shortnes of breath or wheeziing).    [provider]  oxyCODONE-acetaminophen (PERCOCET/ROXICET) 5-325 MG per tablet Take 1 tablet by mouth every 4 (four) hours as needed for moderate pain. Patient not taking: Reported on 01/16/2017 12/23/14   Marlyce Huge, MD  predniSONE (DELTASONE) 50 MG tablet Start with 50 mg daily taper by 10 mg daily then stop Patient not taking: Reported on 01/07/2015 12/23/14   Fritzi Mandes, MD    Allergies Patient has no known allergies.    Social History Social History  Substance Use Topics  . Smoking status: Current Every Day Smoker    Packs/day: 1.00    Years: 45.00    Types: Cigarettes  . Smokeless tobacco: Not on file  . Alcohol use Not on file    Review of Systems Patient denies headaches, rhinorrhea, blurry vision, numbness, shortness of breath, chest pain, edema, cough, abdominal pain, nausea, vomiting, diarrhea, dysuria, fevers, rashes or hallucinations unless otherwise stated above in HPI. ____________________________________________   PHYSICAL EXAM:  VITAL SIGNS: Vitals:   01/16/17 1400 01/16/17 1430  BP: 133/85 (!) 127/94  Pulse: 94 91  Resp:    Temp:      Constitutional: Alert and oriented. Mildly tachypneic Eyes: Conjunctivae are normal.  Head: Atraumatic. Nose: No congestion/rhinnorhea. Mouth/Throat: Mucous membranes are moist.   Neck: No stridor. Painless ROM.  Cardiovascular: Normal rate, regular rhythm. Grossly normal heart sounds.  Good peripheral circulation. Respiratory: tachypneic, diffuse wheezing and coarse breathsounds throughout Gastrointestinal: Soft and nontender. No distention. No abdominal bruits. No CVA tenderness. Musculoskeletal: No lower extremity tenderness nor edema.  No joint effusions. Neurologic:  Normal speech and language. No gross focal neurologic deficits are appreciated. No facial droop Skin:   Skin is warm, dry and intact. No rash noted. Psychiatric: Mood and affect are normal. Speech and behavior are normal.  ____________________________________________   LABS (all labs ordered are listed, but only abnormal results are displayed)  Results for orders placed or performed during the hospital encounter of 01/16/17 (from the past 24 hour(s))  Basic metabolic panel     Status: None   Collection Time: 01/16/17  9:03 AM  Result Value Ref Range   Sodium 140 135 - 145 mmol/L   Potassium 3.6 3.5 - 5.1 mmol/L   Chloride 104 101 - 111 mmol/L   CO2 29 22 - 32 mmol/L   Glucose, Bld 99 65 - 99 mg/dL   BUN 9 6 - 20 mg/dL   Creatinine, Ser 0.69 0.61 - 1.24 mg/dL   Calcium 9.6 8.9 - 10.3 mg/dL   GFR calc non Af Amer >60 >60 mL/min   GFR calc Af Amer >60 >60 mL/min   Anion gap 7 5 - 15  CBC     Status: Abnormal   Collection Time: 01/16/17  9:03 AM  Result Value Ref Range   WBC 9.3 3.8 - 10.6 K/uL   RBC 4.56 4.40 - 5.90 MIL/uL   Hemoglobin 14.6 13.0 - 18.0 g/dL   HCT 42.1 40.0 - 52.0 %   MCV 92.4 80.0 - 100.0 fL   MCH 32.0 26.0 - 34.0 pg   MCHC 34.6 32.0 - 36.0 g/dL   RDW 15.0 (H) 11.5 - 14.5 %   Platelets 202 150 - 440 K/uL  Troponin I     Status: None   Collection Time: 01/16/17  9:03 AM  Result Value Ref Range   Troponin I <0.03 <0.03 ng/mL  Magnesium     Status: None   Collection Time: 01/16/17  9:03 AM  Result Value Ref Range   Magnesium 1.9 1.7 - 2.4 mg/dL   ____________________________________________  EKG My review and personal interpretation at Time: 09:02   Indication: sob  Rate: 90  Rhythm: sinus Axis: normal Other: normal intervals, no acute ischemia ____________________________________________  RADIOLOGY  I personally reviewed all radiographic images ordered to evaluate for the above acute complaints and reviewed radiology reports and findings.  These findings were personally discussed with the patient.  Please see medical record for radiology  report.  ____________________________________________   PROCEDURES  Procedure(s) performed:  Procedures    Critical Care performed: yes CRITICAL CARE Performed by: Merlyn Lot   Total critical care time: 30 minutes  Critical care time was exclusive of separately billable procedures and treating other patients.  Critical care was necessary to treat or prevent imminent or life-threatening deterioration.  Critical care was time spent personally by me on the following activities: development of treatment plan with patient and/or surrogate as well as nursing, discussions with consultants, evaluation of patient's response to treatment, examination of patient, obtaining history from patient or surrogate, ordering and performing treatments and interventions, ordering and review of laboratory studies, ordering and review of radiographic studies, pulse oximetry and re-evaluation of patient's condition.  ____________________________________________   INITIAL IMPRESSION / ASSESSMENT AND PLAN / ED COURSE  Pertinent labs & imaging results that were available during my care of the patient were reviewed by me and considered in my medical decision making (see chart for details).  DDX: Asthma, copd, CHF, pna, ptx, malignancy, Pe, anemia   Wren Gallaga Holdren is a 56 y.o. who presents to the ED with chief complaint of shortness of breath and chest pain as described above. He is afebrile and hemodynamically stable but does appear in moderate respiratory distress with tachypnea and diffuse rhonchorous breath sounds. Patient given multiple nebulizer treatments upon arrival without any significant improvement. No significant evidence of congestive heart failure. EKG shows no evidence of ischemia. Based on his diffuse rhonchorous breath sounds this is less consistent with pulmonary embolism. Patient given IV steroids. His magnesium was normal. Patient became markedly short of breath with acute respiratory  failure with hypoxia down to 88% when ambulating. Based on his presentation to fill patient will require admission to hospital for further evaluation and management.  Have discussed with the patient and available family all diagnostics and treatments performed thus far and all questions were answered to the best of my ability. The patient demonstrates understanding and agreement with plan.       ____________________________________________   FINAL CLINICAL IMPRESSION(S) / ED DIAGNOSES  Final diagnoses:  Acute respiratory failure with hypoxia (HCC)  Chronic obstructive pulmonary disease, unspecified COPD type ([place])      NEW MEDICATIONS STARTED DURING THIS VISIT:  New Prescriptions  No medications on file     Note:  This document was prepared using Dragon voice recognition software and may include unintentional dictation errors.    Merlyn Lot, MD 01/16/17 [phone]

## 2017-01-16 NOTE — ED Notes (Signed)
Pt up to toilet. Gait steady.

## 2017-01-16 NOTE — ED Triage Notes (Signed)
Pt arrived via EMS from [place] [address] for reports of non radiating chest pain that began this morning. Pt with noted rhonchi on arrival. Pt alert and oriented.

## 2017-01-16 NOTE — H&P (Signed)
[facility] [place] NAME: [name]    MR#:  [id number]  DATE OF BIRTH:  19-Jul-1961  DATE OF ADMISSION:  01/16/2017  PRIMARY CARE PHYSICIAN: Donnie Coffin, MD   REQUESTING/REFERRING PHYSICIAN: Dr. Quentin Cornwall  CHIEF COMPLAINT:   Chief Complaint  Patient presents with  . Chest Pain    HISTORY OF PRESENT ILLNESS:  Clinton Dragone  is a 56 y.o. male with a known history of COPD, hypertension, depression presents to the emergency room with 3 days of on and off cough, wheezing and shortness of breath. Here in the emergency room patient was given multiple nebulizer treatment along with IV steroids. Chest x-ray shows no pneumonia. Lung nodule. Patient desatted to 87% on ambulation. At rest he is at 93%. Has significant wheezing and has no improvement in spite of the nebulizer treatment. Being admitted to the hospitalist service for further treatment.  He had similar episode 2 years back and was admitted to the hospital. Never been on oxygen at home.  PAST MEDICAL HISTORY:   Past Medical History:  Diagnosis Date  . COPD (chronic obstructive pulmonary disease) ([place])   . Depression   . History of hiatal hernia   . Hypertension   . Pneumonia   . Schizophrenia ([place])   . Shortness of breath dyspnea   . Stroke [facility])     PAST SURGICAL HISTORY:   Past Surgical History:  Procedure Laterality Date  . HERNIA REPAIR    . INSERTION OF MESH N/A 12/18/2014   Procedure: INSERTION OF MESH;  Surgeon: Florene Glen, MD;  Location: [facility] ORS;  Service: General;  Laterality: N/A;  . SUPRA-UMBILICAL HERNIA  11/14/8117   Procedure: SUPRA-UMBILICAL HERNIA;  Surgeon: Florene Glen, MD;  Location: [facility] ORS;  Service: General;;  . UMBILICAL HERNIA REPAIR N/A 12/18/2014   Procedure: HERNIA REPAIR UMBILICAL ADULT;  Surgeon: Florene Glen, MD;  Location: [facility] ORS;  Service: General;  Laterality: N/A;    SOCIAL HISTORY:   Social History  Substance Use Topics   . Smoking status: Current Every Day Smoker    Packs/day: 1.00    Years: 45.00    Types: Cigarettes  . Smokeless tobacco: Not on file  . Alcohol use Not on file    FAMILY HISTORY:   Family History  Problem Relation Age of Onset  . Asthma Mother   . Hypertension Father     DRUG ALLERGIES:  No Known Allergies  REVIEW OF SYSTEMS:   Review of Systems  Constitutional: Positive for malaise/fatigue. Negative for chills, fever and weight loss.  HENT: Negative for hearing loss and nosebleeds.   Eyes: Negative for blurred vision, double vision and pain.  Respiratory: Positive for cough, sputum production, shortness of breath and wheezing. Negative for hemoptysis.   Cardiovascular: Negative for chest pain, palpitations, orthopnea and leg swelling.  Gastrointestinal: Negative for abdominal pain, constipation, diarrhea, nausea and vomiting.  Genitourinary: Negative for dysuria and hematuria.  Musculoskeletal: Negative for back pain, falls and myalgias.  Skin: Negative for rash.  Neurological: Positive for weakness. Negative for dizziness, tremors, sensory change, speech change, focal weakness, seizures and headaches.  Endo/Heme/Allergies: Does not bruise/bleed easily.  Psychiatric/Behavioral: Positive for depression. Negative for memory loss. The patient is not nervous/anxious.     MEDICATIONS AT HOME:   Prior to Admission medications   Medication Sig Start Date End Date Taking? Authorizing Provider  amantadine (SYMMETREL) 100 MG capsule Take 100 mg by mouth 2 (two) times  daily.   Yes [provider]  budesonide-formoterol (SYMBICORT) 160-4.5 MCG/ACT inhaler Inhale 2 puffs into the lungs 2 (two) times daily.   Yes [provider]  docusate sodium (COLACE) 100 MG capsule Take 100 mg by mouth 2 (two) times daily as needed for mild constipation.   Yes [provider]  gabapentin (NEURONTIN) 300 MG capsule Take 300 mg by mouth 3 (three) times daily.   Yes  [provider]  guaiFENesin (MUCINEX) 600 MG 12 hr tablet Take 600 mg by mouth 2 (two) times daily.   Yes [provider]  hydrochlorothiazide (HYDRODIURIL) 12.5 MG tablet Take 12.5 mg by mouth daily.   Yes [provider]  lovastatin (MEVACOR) 20 MG tablet Take 20 mg by mouth at bedtime.   Yes [provider]  Melatonin 5 MG TABS Take 1 tablet by mouth at bedtime as needed.   Yes [provider]  mirtazapine (REMERON) 30 MG tablet Take 30 mg by mouth at bedtime.   Yes [provider]  Omega-3 Fatty Acids (FISH OIL) 1000 MG CPDR Take by mouth.   Yes [provider]  Paliperidone Palmitate (INVEGA SUSTENNA IM) every 3 (three) months.  12/30/14  Yes [provider]  polyethylene glycol (MIRALAX / GLYCOLAX) packet Take 17 g by mouth daily.   Yes [provider]  QUEtiapine (SEROQUEL) 200 MG tablet Take 200 mg by mouth at bedtime.   Yes [provider]  Vitamin D, Ergocalciferol, (DRISDOL) 50000 UNITS CAPS capsule Take by mouth.   Yes [provider]  acetaminophen (TYLENOL) 325 MG tablet Take 650 mg by mouth 2 (two) times daily as needed for mild pain.    [provider]  albuterol (PROVENTIL HFA;VENTOLIN HFA) 108 (90 BASE) MCG/ACT inhaler Inhale 2 puffs into the lungs every 4 (four) hours as needed for wheezing or shortness of breath.    [provider]  ipratropium-albuterol (DUONEB) 0.5-2.5 (3) MG/3ML SOLN Take 3 mLs by nebulization every 4 (four) hours as needed (every 4 to 6 hours as needed for shortnes of breath or wheeziing).    [provider]  oxyCODONE-acetaminophen (PERCOCET/ROXICET) 5-325 MG per tablet Take 1 tablet by mouth every 4 (four) hours as needed for moderate pain. Patient not taking: Reported on 01/16/2017 12/23/14   Marlyce Huge, MD  predniSONE (DELTASONE) 50 MG tablet Start with 50 mg daily taper by 10 mg daily then stop Patient not taking:  Reported on 01/07/2015 12/23/14   Fritzi Mandes, MD     VITAL SIGNS:  Blood pressure (!) 143/81, pulse 98, temperature 97.5 F (36.4 C), temperature source Oral, resp. rate (!) 26, height 6\' 1"  (1.854 m), weight 117 kg (258 lb), SpO2 94 %.  PHYSICAL EXAMINATION:  Physical Exam  GENERAL:  57 y.o.-year-old patient lying in the bed with no acute distress.  EYES: Pupils equal, round, reactive to light and accommodation. No scleral icterus. Extraocular muscles intact.  HEENT: Head atraumatic, normocephalic. Oropharynx and nasopharynx clear. No oropharyngeal erythema, moist oral mucosa  NECK:  Supple, no jugular venous distention. No thyroid enlargement, no tenderness.  LUNGS: Bilateral wheezing and poor air entry. CARDIOVASCULAR: S1, S2 normal. No murmurs, rubs, or gallops.  ABDOMEN: Soft, nontender, nondistended. Bowel sounds present. No organomegaly or mass.  EXTREMITIES: No pedal edema, cyanosis, or clubbing. + 2 pedal & radial pulses b/l.   NEUROLOGIC: Cranial nerves II through XII are intact. No focal Motor or sensory deficits appreciated b/l. PSYCHIATRIC: The patient is alert and oriented  x 3. Good affect.  SKIN: No obvious rash, lesion, or ulcer.   LABORATORY PANEL:   CBC  Recent Labs Lab 01/16/17 0903  WBC 9.3  HGB 14.6  HCT 42.1  PLT 202   ------------------------------------------------------------------------------------------------------------------  Chemistries   Recent Labs Lab 01/16/17 0903  NA 140  K 3.6  CL 104  CO2 29  GLUCOSE 99  BUN 9  CREATININE 0.69  CALCIUM 9.6  MG 1.9   ------------------------------------------------------------------------------------------------------------------  Cardiac Enzymes  Recent Labs Lab 01/16/17 0903  TROPONINI <0.03   ------------------------------------------------------------------------------------------------------------------  RADIOLOGY:  Dg Chest 2 View  Result Date: 01/16/2017 CLINICAL DATA:  Onset of  non radiating chest pain and shortness of breath this morning. Abnormal chest exam on arrival. Long-term smoker. History of COPD and previous CVA EXAM: CHEST  2 VIEW COMPARISON:  Portable chest x-ray of Dec 22, 2014 FINDINGS: The lungs are well-expanded with hemidiaphragm flattening. The lung markings are coarse in the infrahilar region on the right and at the left lung base posteriorly. There is no pleural effusion or pneumothorax. The heart and pulmonary vascularity are normal. There is calcification in the wall of the aortic arch. The bony thorax is unremarkable. IMPRESSION: COPD. No discrete pneumonia. Bibasilar are interstitial changes which appear chronic though superimposed acute bronchitis is not excluded. Subcentimeter nodule projecting in the right lower lung field on the frontal view at approximately the level of the anterior 6 rib. This was not clearly evident on the previous study and is not definitely due to a nipple marker. A repeat PA chest x-ray with nipple markers would be useful. If this does not proved to be a benign nipple shadow, chest CT scanning would be the most useful next imaging step. Thoracic aortic atherosclerosis. Electronically Signed   By: David  Martinique M.D.   On: 01/16/2017 09:49     IMPRESSION AND PLAN:   * Acute COPD exacerbation -IV steroids, Antibiotics - Scheduled Nebulizers - Inhalers -Wean O2 as tolerated - Consult pulmonary if no improvement  * Acute hypoxic respiratory failure due to COPD exacerbation. Patient desatted to 87% on ambulation. Will need to reassess in the morning if he needs home oxygen at discharge.  * Hypertension. Continue home medications.  * Depression. Continue home medications  * DVT prophylaxis with Lovenox  All the records are reviewed and case discussed with ED provider. Management plans discussed with the patient, family and they are in agreement.  CODE STATUS: FULL CODE  TOTAL TIME TAKING CARE OF THIS PATIENT: 40 minutes.    Hillary Bow R M.D on 01/16/2017 at 2:10 PM  Between 7am to 6pm - Pager - [phone]  After 6pm go to [email or web address] - password [email or web address] [facility]  Office  [phone]  CC: Primary care physician; Donnie Coffin, MD  Note: This dictation was prepared with Dragon dictation along with smaller phrase technology. Any transcriptional errors that result from this process are unintentional.

## 2017-01-17 LAB — BASIC METABOLIC PANEL
ANION GAP: 9 (ref 5–15)
BUN: 9 mg/dL (ref 6–20)
CO2: 24 mmol/L (ref 22–32)
Calcium: 9.3 mg/dL (ref 8.9–10.3)
Chloride: 103 mmol/L (ref 101–111)
Creatinine, Ser: 0.48 mg/dL — ABNORMAL LOW (ref 0.61–1.24)
GLUCOSE: 130 mg/dL — AB (ref 65–99)
POTASSIUM: 4.1 mmol/L (ref 3.5–5.1)
Sodium: 136 mmol/L (ref 135–145)

## 2017-01-17 LAB — CBC
HEMATOCRIT: 41.5 % (ref 40.0–52.0)
HEMOGLOBIN: 14.1 g/dL (ref 13.0–18.0)
MCH: 31.7 pg (ref 26.0–34.0)
MCHC: 33.9 g/dL (ref 32.0–36.0)
MCV: 93.5 fL (ref 80.0–100.0)
Platelets: 200 10*3/uL (ref 150–440)
RBC: 4.44 MIL/uL (ref 4.40–5.90)
RDW: 14.9 % — ABNORMAL HIGH (ref 11.5–14.5)
WBC: 9.3 10*3/uL (ref 3.8–10.6)

## 2017-01-17 MED ORDER — METHYLPREDNISOLONE SODIUM SUCC 40 MG IJ SOLR
40.0000 mg | Freq: Two times a day (BID) | INTRAMUSCULAR | Status: DC
  Administered 2017-01-17 – 2017-01-21 (×8): 40 mg via INTRAVENOUS
  Filled 2017-01-17 (×8): qty 1

## 2017-01-17 MED ORDER — ORAL CARE MOUTH RINSE
15.0000 mL | Freq: Two times a day (BID) | OROMUCOSAL | Status: DC
  Administered 2017-01-17 – 2017-01-21 (×7): 15 mL via OROMUCOSAL

## 2017-01-17 NOTE — Evaluation (Signed)
Physical Therapy Evaluation Patient Details Name: [name] MRN: [id number] DOB: 10/12/1960 Today's Date: 01/17/2017   History of Present Illness  presented to ER secondary to cough, wheezing, SOB; admitted with acute hypoxic respiratory failure related to COPD exacerbation.  Currently on 2L supplemental O2.  Clinical Impression  Upon evaluation, patient alert and oriented to all information; follows all commands and demonstrates fair insight/safety awareness.  Bilat UE/LE strength and ROM grossly symmetrical and WFL for basic transfers and mobility.  Demonstrates ability to complete bed mobility, sit/stand, basic transfers and gait (200') without assist device, sup/mod indep.  No buckling, LOB or safety concern; mod cuing/education for signs/symptoms of fatigue and need for activity pacing to manage cardiopulmonary deficits. Mod SOB with exertion with noted desat to 86% on 3L with activity, requiring 30-45 seconds of seated rest and pursed lip breathing for recovery to >91%.  Returned to baseline 2L end of session. RN informed/aware of response to activity. Would benefit from skilled PT to address above deficits and promote optimal return to PLOF; will maintain on caseload throughout remaining hospitalization to monitor cardiopulmonary response to activity, anticipate no formal PT needs upon discharge.  SaO2 on room air at rest = 87% SaO2 on 2L at rest = 90% SaO2 on 3L liters of O2 while ambulating = 86%     Follow Up Recommendations No PT follow up (will maintain on caseload throughout remaining hospitalization to monitor cardiopulm response to activity ;anticipate no PT needs upon discharge)    Equipment Recommendations       Recommendations for Other Services       Precautions / Restrictions Precautions Precautions: Fall Restrictions Weight Bearing Restrictions: No      Mobility  Bed Mobility Overal bed mobility: Modified Independent                 Transfers Overall transfer level: Modified independent Equipment used: None                Ambulation/Gait Ambulation/Gait assistance: Modified independent (Device/Increase time) Ambulation Distance (Feet): 200 Feet Assistive device: None   Gait velocity: 10' walk time, 5-6 seconds   General Gait Details: reciprocal stepping pattern with good trunk rotation, arm swing, cadence and overall gait performance.  Safe and confident in movement; no buckling or LOB.  Mod SOB with exertion with noted desat to 86% on 3L, requiring seated rest and pursed lip breathing for recovery to >91% (30 seconds)  Stairs            Wheelchair Mobility    Modified Rankin (Stroke Patients Only)       Balance Overall balance assessment: Modified Independent                                           Pertinent Vitals/Pain Pain Assessment: No/denies pain    Home Living Family/patient expects to be discharged to:: Group home Living Arrangements: Group Home               Additional Comments: [facility], single-story with level entrance    Prior Function Level of Independence: Independent         Comments: Indep with ADLs, household and community mobility; no home O2.  Denies fall history; active, enjoys playing basketball     Hand Dominance        Extremity/Trunk Assessment   Upper Extremity  Assessment Upper Extremity Assessment: Overall WFL for tasks assessed    Lower Extremity Assessment Lower Extremity Assessment: Overall WFL for tasks assessed       Communication   Communication: No difficulties  Cognition Arousal/Alertness: Awake/alert Behavior During Therapy: WFL for tasks assessed/performed Overall Cognitive Status: Within Functional Limits for tasks assessed                                        General Comments      Exercises Other Exercises Other Exercises: Educated patient on  signs/symptoms of fatigue, role of activity pacing and energy conservation; patient voiced understanding.  Will continue to reinfoce to maximize carry-over and integration with all functional activities.   Assessment/Plan    PT Assessment Patient needs continued PT services  PT Problem List Cardiopulmonary status limiting activity       PT Treatment Interventions Gait training;Stair training;Functional mobility training;Balance training;Therapeutic exercise;Therapeutic activities;Patient/family education    PT Goals (Current goals can be found in the Care Plan section)  Acute Rehab PT Goals Patient Stated Goal: to return to group home PT Goal Formulation: With patient Time For Goal Achievement: 01/31/17 Potential to Achieve Goals: Good    Frequency Min 2X/week   Barriers to discharge Decreased caregiver support      Co-evaluation               AM-PAC PT "6 Clicks" Daily Activity  Outcome Measure Difficulty turning over in bed (including adjusting bedclothes, sheets and blankets)?: None Difficulty moving from lying on back to sitting on the side of the bed? : None Difficulty sitting down on and standing up from a chair with arms (e.g., wheelchair, bedside commode, etc,.)?: None Help needed moving to and from a bed to chair (including a wheelchair)?: None Help needed walking in hospital room?: None Help needed climbing 3-5 steps with a railing? : A Little 6 Click Score: 23    End of Session Equipment Utilized During Treatment: Gait belt;Oxygen Activity Tolerance: Patient tolerated treatment well Patient left: in chair;with call bell/phone within reach;with chair alarm set Nurse Communication: Mobility status (O2 response to activity) PT Visit Diagnosis: Difficulty in walking, not elsewhere classified (R26.2)    Time: 0347-4259 PT Time Calculation (min) (ACUTE ONLY): 17 min   Charges:   PT Evaluation $PT Eval Low Complexity: 1 Procedure PT Treatments $Therapeutic  Activity: 8-22 mins   PT G Codes:   PT G-Codes **NOT FOR INPATIENT CLASS** Functional Assessment Tool Used: AM-PAC 6 Clicks Basic Mobility Functional Limitation: Mobility: Walking and moving around Mobility: Walking and Moving Around Current Status (D6387): At least 1 percent but less than 20 percent impaired, limited or restricted Mobility: Walking and Moving Around Goal Status [phone]): 0 percent impaired, limited or restricted    Mariell Nester H. Owens Shark, PT, DPT, NCS 01/17/17, 11:36 AM [phone]

## 2017-01-17 NOTE — Progress Notes (Signed)
[place] [facility] [place] NAME: [name]    MR#:  [id number]  DATE OF BIRTH:  May 20, 1961  SUBJECTIVE:   Patient's shortness of breath and wheezing have somewhat improved since admission  REVIEW OF SYSTEMS:    Review of Systems  Constitutional: Negative for fever, chills weight loss HENT: Negative for ear pain, nosebleeds, congestion, facial swelling, rhinorrhea, neck pain, neck stiffness and ear discharge.   Respiratory: Positive for cough, shortness of breath and wheezing Cardiovascular: Negative for chest pain, palpitations and leg swelling.  Gastrointestinal: Negative for heartburn, abdominal pain, vomiting, diarrhea or consitpation Genitourinary: Negative for dysuria, urgency, frequency, hematuria Musculoskeletal: Negative for back pain or joint pain Neurological: Negative for dizziness, seizures, syncope, focal weakness,  numbness and headaches.  Hematological: Does not bruise/bleed easily.  Psychiatric/Behavioral: Negative for hallucinations, confusion, dysphoric mood    Tolerating Diet: yes      DRUG ALLERGIES:  No Known Allergies  VITALS:  Blood pressure (!) 141/82, pulse 88, temperature 97.9 F (36.6 C), temperature source Oral, resp. rate 18, height 6\' 1"  (1.854 m), weight 117 kg (258 lb), SpO2 93 %.  PHYSICAL EXAMINATION:  Constitutional: Appears well-developed and well-nourished. No distress. HENT: Normocephalic. Marland Kitchen Oropharynx is clear and moist.  Eyes: Conjunctivae and EOM are normal. PERRLA, no scleral icterus.  Neck: Normal ROM. Neck supple. No JVD. No tracheal deviation. CVS: RRR, S1/S2 +, no murmurs, no gallops, no carotid bruit.  Pulmonary: Normal effort with bilateral prolonged expiratory wheezing no rhonchi or rales.  Abdominal: Soft. BS +,  no distension, tenderness, rebound or guarding.  Musculoskeletal: Normal range of motion. No edema and no tenderness.  Neuro: Alert. CN 2-12 grossly intact. No focal  deficits. Skin: Skin is warm and dry. No rash noted. Psychiatric: Normal mood and affect.      LABORATORY PANEL:   CBC  Recent Labs Lab 01/17/17 0452  WBC 9.3  HGB 14.1  HCT 41.5  PLT 200   ------------------------------------------------------------------------------------------------------------------  Chemistries   Recent Labs Lab 01/16/17 0903 01/17/17 0452  NA 140 136  K 3.6 4.1  CL 104 103  CO2 29 24  GLUCOSE 99 130*  BUN 9 9  CREATININE 0.69 0.48*  CALCIUM 9.6 9.3  MG 1.9  --    ------------------------------------------------------------------------------------------------------------------  Cardiac Enzymes  Recent Labs Lab 01/16/17 0903  TROPONINI <0.03   ------------------------------------------------------------------------------------------------------------------  RADIOLOGY:  Dg Chest 2 View  Result Date: 01/16/2017 CLINICAL DATA:  Onset of non radiating chest pain and shortness of breath this morning. Abnormal chest exam on arrival. Long-term smoker. History of COPD and previous CVA EXAM: CHEST  2 VIEW COMPARISON:  Portable chest x-ray of Dec 22, 2014 FINDINGS: The lungs are well-expanded with hemidiaphragm flattening. The lung markings are coarse in the infrahilar region on the right and at the left lung base posteriorly. There is no pleural effusion or pneumothorax. The heart and pulmonary vascularity are normal. There is calcification in the wall of the aortic arch. The bony thorax is unremarkable. IMPRESSION: COPD. No discrete pneumonia. Bibasilar are interstitial changes which appear chronic though superimposed acute bronchitis is not excluded. Subcentimeter nodule projecting in the right lower lung field on the frontal view at approximately the level of the anterior 6 rib. This was not clearly evident on the previous study and is not definitely due to a nipple marker. A repeat PA chest x-ray with nipple markers would be useful. If this does not  proved to be a benign nipple shadow,  chest CT scanning would be the most useful next imaging step. Thoracic aortic atherosclerosis. Electronically Signed   By: David  Martinique M.D.   On: 01/16/2017 09:49     ASSESSMENT AND PLAN:   56 year old male with history of tobacco dependence and COPD who presents with acute exacerbation of COPD.  1. Acute hypoxic respiratory failure in the setting of COPD exacerbation: Patient has now been weaned off of oxygen.  2. Acute COPD exacerbation with acute bronchitis: Patient continues to have some bilateral prolonged expiratory wheezing, however improved since admission Decrease IV steroids Continue nebs Continue azithromycin  3.Tobacco dependence: Patient is encouraged to quit smoking. Counseling was provided for 4 minutes.  4. Hyperlipidemia: Continue pravastatin  5. Schizophrenia: Continue Seroquel and Remeron   PT evaluation for discharge planning   Management plans discussed with the patient and he is in agreement.  CODE STATUS: full  TOTAL TIME TAKING CARE OF THIS PATIENT: 37 minutes.     POSSIBLE D/C tomorrow, DEPENDING ON CLINICAL CONDITION.   Tamirah George M.D on 01/17/2017 at 9:46 AM  Between 7am to 6pm - Pager - [phone] After 6pm go to [email or web address] - password [email or web address] [facility]  Office  [phone]  CC: Primary care physician; Donnie Coffin, MD  Note: This dictation was prepared with Dragon dictation along with smaller phrase technology. Any transcriptional errors that result from this process are unintentional.

## 2017-01-17 NOTE — NC FL2 (Addendum)
[place] LEVEL OF CARE SCREENING TOOL     IDENTIFICATION  Patient Name: [name] Birthdate: [birth year] Sex: male Admission Date (Current Location): 01/16/2017  [facility] and [place] Number:  [name]  ([id number] [facility]) Facility and Address:  [facility], [address] [place], [place] [address]      Provider Number: [id number]  Attending Physician Name and Address:  Bettey Costa, MD  Relative Name and Phone Number:       Current Level of Care: Hospital Recommended Level of Care: Other (Comment) (Group Home ) Prior Approval Number:    Date Approved/Denied:   PASRR Number:    Discharge Plan: Domiciliary (Rest home) (Group Home )    Current Diagnoses: Patient Active Problem List   Diagnosis Date Noted  . COPD exacerbation ([place]) 01/16/2017  . Ventral hernia 12/18/2014  . Hematoma of leg 01/30/2014    Orientation RESPIRATION BLADDER Height & Weight     Self, Time, Situation, Place  O2 (2 Liters Oxygen ) Continent Weight: 258 lb (117 kg) Height:  6\' 1"  (185.4 cm)  BEHAVIORAL SYMPTOMS/MOOD NEUROLOGICAL BOWEL NUTRITION STATUS   (none)  (none) Continent Diet (Diet: Heart Healthy )  AMBULATORY STATUS COMMUNICATION OF NEEDS Skin   Independent Verbally Normal                       Personal Care Assistance Level of Assistance  Bathing, Feeding, Dressing Bathing Assistance: Independent Feeding assistance: Independent Dressing Assistance: Independent     Functional Limitations Info  Sight, Hearing, Speech Sight Info: Adequate Hearing Info: Adequate Speech Info: Adequate    SPECIAL CARE FACTORS FREQUENCY                       Contractures      Additional Factors Info  Code Status, Allergies Code Status Info:  (Full Code. ) Allergies Info:  (No Known Allergies. )           Current Medications (01/17/2017):  This is the current hospital active medication list Current Facility-Administered Medications   Medication Dose Route Frequency Provider Last Rate Last Dose  . 0.9 %  sodium chloride infusion  250 mL Intravenous PRN Sudini, Alveta Heimlich, MD      . acetaminophen (TYLENOL) tablet 650 mg  650 mg Oral Q6H PRN Hillary Bow, MD       Or  . acetaminophen (TYLENOL) suppository 650 mg  650 mg Rectal Q6H PRN Sudini, Srikar, MD      . albuterol (PROVENTIL) (2.5 MG/3ML) 0.083% nebulizer solution 2.5 mg  2.5 mg Nebulization Q2H PRN Sudini, Srikar, MD      . amantadine (SYMMETREL) capsule 100 mg  100 mg Oral BID Hillary Bow, MD   100 mg at 01/17/17 1011  . azithromycin (ZITHROMAX) tablet 500 mg  500 mg Oral Daily Hillary Bow, MD   500 mg at 01/17/17 1011  . enoxaparin (LOVENOX) injection 40 mg  40 mg Subcutaneous Q24H Hillary Bow, MD   40 mg at 01/16/17 2216  . gabapentin (NEURONTIN) capsule 300 mg  300 mg Oral TID Hillary Bow, MD   300 mg at 01/17/17 1518  . guaiFENesin (MUCINEX) 12 hr tablet 600 mg  600 mg Oral BID Hillary Bow, MD   600 mg at 01/17/17 1010  . hydrochlorothiazide (HYDRODIURIL) tablet 12.5 mg  12.5 mg Oral Daily Sudini, Alveta Heimlich, MD   12.5 mg at 01/17/17 1011  . ipratropium-albuterol (DUONEB) 0.5-2.5 (3) MG/3ML nebulizer solution 3  mL  3 mL Nebulization Q6H Sudini, Alveta Heimlich, MD   3 mL at 01/17/17 1324  . MEDLINE mouth rinse  15 mL Mouth Rinse BID Hillary Bow, MD   15 mL at 01/17/17 1026  . methylPREDNISolone sodium succinate (SOLU-MEDROL) 40 mg/mL injection 40 mg  40 mg Intravenous Q12H Mody, Sital, MD      . mirtazapine (REMERON) tablet 30 mg  30 mg Oral QHS Hillary Bow, MD   30 mg at 01/16/17 2214  . mometasone-formoterol (DULERA) 200-5 MCG/ACT inhaler 2 puff  2 puff Inhalation BID Hillary Bow, MD   2 puff at 01/17/17 1012  . nicotine (NICODERM CQ - dosed in mg/24 hours) patch 14 mg  14 mg Transdermal Daily Hugelmeyer, Alexis, DO   14 mg at 01/17/17 1014  . ondansetron (ZOFRAN) tablet 4 mg  4 mg Oral Q6H PRN Sudini, Alveta Heimlich, MD       Or  . ondansetron (ZOFRAN)  injection 4 mg  4 mg Intravenous Q6H PRN Sudini, Srikar, MD      . polyethylene glycol (MIRALAX / GLYCOLAX) packet 17 g  17 g Oral Daily PRN Sudini, Srikar, MD      . polyethylene glycol (MIRALAX / GLYCOLAX) packet 17 g  17 g Oral Daily Hillary Bow, MD   17 g at 01/17/17 1014  . pravastatin (PRAVACHOL) tablet 40 mg  40 mg Oral q1800 Hillary Bow, MD   40 mg at 01/16/17 1739  . QUEtiapine (SEROQUEL) tablet 200 mg  200 mg Oral QHS Hillary Bow, MD   200 mg at 01/16/17 2215  . sodium chloride flush (NS) 0.9 % injection 3 mL  3 mL Intravenous Q12H Sudini, Srikar, MD   3 mL at 01/17/17 1013  . sodium chloride flush (NS) 0.9 % injection 3 mL  3 mL Intravenous PRN Sudini, Alveta Heimlich, MD         Discharge Medications: DISCHARGE MEDICATIONS:       Current Discharge Medication List        START taking these medications   Details  nicotine (NICODERM CQ - DOSED IN MG/24 HOURS) 14 mg/24hr patch Place 1 patch (14 mg total) onto the skin daily. Qty: 28 patch, Refills: 0    tiotropium (SPIRIVA) 18 MCG inhalation capsule Place 1 capsule (18 mcg total) into inhaler and inhale daily. Qty: 30 capsule, Refills: 12          CONTINUE these medications which have CHANGED   Details  predniSONE (DELTASONE) 10 MG tablet Take 1 tablet (10 mg total) by mouth daily with breakfast. 60 mg PO (ORAL) x 2 days 50 mg PO (ORAL)  x 2 days 40 mg PO (ORAL)  x 2 days 30 mg PO  (ORAL)  x 2 days 20 mg PO  (ORAL) x 2 days 10 mg PO  (ORAL) x 2 days then stop Qty: 42 tablet, Refills: 0          CONTINUE these medications which have NOT CHANGED   Details  amantadine (SYMMETREL) 100 MG capsule Take 100 mg by mouth 2 (two) times daily.    budesonide-formoterol (SYMBICORT) 160-4.5 MCG/ACT inhaler Inhale 2 puffs into the lungs 2 (two) times daily.    docusate sodium (COLACE) 100 MG capsule Take 100 mg by mouth 2 (two) times daily as needed for mild constipation.    gabapentin (NEURONTIN) 300 MG capsule  Take 300 mg by mouth 3 (three) times daily.    guaiFENesin (MUCINEX) 600 MG 12 hr tablet Take 600 mg by  mouth 2 (two) times daily.    hydrochlorothiazide (HYDRODIURIL) 12.5 MG tablet Take 12.5 mg by mouth daily.    lovastatin (MEVACOR) 20 MG tablet Take 20 mg by mouth at bedtime.    Melatonin 5 MG TABS Take 1 tablet by mouth at bedtime as needed.    mirtazapine (REMERON) 30 MG tablet Take 30 mg by mouth at bedtime.    Omega-3 Fatty Acids (FISH OIL) 1000 MG CPDR Take by mouth.    Paliperidone Palmitate (INVEGA SUSTENNA IM) every 3 (three) months.     polyethylene glycol (MIRALAX / GLYCOLAX) packet Take 17 g by mouth daily.    QUEtiapine (SEROQUEL) 200 MG tablet Take 200 mg by mouth at bedtime.    Vitamin D, Ergocalciferol, (DRISDOL) 50000 UNITS CAPS capsule Take by mouth.    acetaminophen (TYLENOL) 325 MG tablet Take 650 mg by mouth 2 (two) times daily as needed for mild pain.    albuterol (PROVENTIL HFA;VENTOLIN HFA) 108 (90 BASE) MCG/ACT inhaler Inhale 2 puffs into the lungs every 4 (four) hours as needed for wheezing or shortness of breath.    ipratropium-albuterol (DUONEB) 0.5-2.5 (3) MG/3ML SOLN Take 3 mLs by nebulization every 4 (four) hours as needed (every 4 to 6 hours as needed for shortnes of breath or wheeziing).         STOP taking these medications     oxyCODONE-acetaminophen (PERCOCET/ROXICET) 5-325 MG per tablet          Relevant Imaging Results:  Relevant Lab Results:   Additional Information  (SSN: [id number])  Sample, Veronia Beets, LCSW

## 2017-01-17 NOTE — Clinical Social Work Note (Signed)
Clinical Social Work Assessment  Patient Details  Name: [name] MRN: [id number] Date of Birth: 11-01-1960  Date of referral:  01/17/17               Reason for consult:  Other (Comment Required) (From [facility])                Permission sought to share information with:  Facility Art therapist granted to share information::  Yes, Verbal Permission Granted  Name::        Agency::     Relationship::     Contact Information:     Housing/Transportation Living arrangements for the past 2 months:  Group Home Source of Information:  Patient, Facility Patient Interpreter Needed:  None Criminal Activity/Legal Involvement Pertinent to Current Situation/Hospitalization:  No - Comment as needed Significant Relationships:  None Lives with:  Facility Resident Do you feel safe going back to the place where you live?  Yes Need for family participation in patient care:  No (Coment)  Care giving concerns:  Patient is a resident at [facility] located at [place]. [place] [address]. Phone and fax # [phone].   Social Worker assessment / plan:  Holiday representative (CSW) received consult that patient is from a group home. PT is recommending no follow up. CSW contacted [name] [phone] group home administrator to get additional information. Per [name] patient has lived at the group home for 7 years and walks independently without an assistive device at baseline. Per [name] patient is on room air at baseline and has a nebulizer machine at the group home. [name] reported that patient makes his own decisions and does not have a guardian. Per [name] patient can return to the group home and she will arrange transport. CSW met with patient to discuss D/C plan. Patient was alert and oriented X4 and reported that he has lived at the group home on and off for 12 years. Patient reported that he has no HPOA and no family in this area.  Patient is agreeable to return to the group home. CSW will continue to follow and assist as needed.   Employment status:  Disabled (Comment on whether or not currently receiving Disability) Insurance information:  Medicare, Medicaid In [place] PT Recommendations:  No Follow Up Information / Referral to community resources:  Other (Comment Required) (Patient will return to the group home. )  Patient/Family's Response to care:  Patient is agreeable to return to the group home.   Patient/Family's Understanding of and Emotional Response to Diagnosis, Current Treatment, and Prognosis:  Patient was pleasant and thanked CSW for visit.   Emotional Assessment Appearance:  Appears stated age Attitude/Demeanor/Rapport:    Affect (typically observed):  Accepting, Adaptable, Pleasant Orientation:  Oriented to Self, Oriented to Place, Oriented to  Time, Oriented to Situation Alcohol / Substance use:  Not Applicable Psych involvement (Current and /or in the community):  No (Comment)  Discharge Needs  Concerns to be addressed:  Discharge Planning Concerns Readmission within the last 30 days:  No Current discharge risk:  Chronically ill Barriers to Discharge:  Continued Medical Work up   [employer], Veronia Beets, LCSW 01/17/2017, 4:57 PM

## 2017-01-17 NOTE — Care Management Obs Status (Signed)
[place] NOTIFICATION   Patient Details  Name: [name] MRN: [id number] Date of Birth: 09-Apr-1961   Medicare Observation Status Notification Given:  Yes    Jolly Mango, RN 01/17/2017, 11:38 AM

## 2017-01-18 DIAGNOSIS — J440 Chronic obstructive pulmonary disease with acute lower respiratory infection: Secondary | ICD-10-CM | POA: Diagnosis present

## 2017-01-18 DIAGNOSIS — F209 Schizophrenia, unspecified: Secondary | ICD-10-CM | POA: Diagnosis present

## 2017-01-18 DIAGNOSIS — E785 Hyperlipidemia, unspecified: Secondary | ICD-10-CM | POA: Diagnosis present

## 2017-01-18 DIAGNOSIS — I10 Essential (primary) hypertension: Secondary | ICD-10-CM | POA: Diagnosis present

## 2017-01-18 DIAGNOSIS — J441 Chronic obstructive pulmonary disease with (acute) exacerbation: Secondary | ICD-10-CM | POA: Diagnosis present

## 2017-01-18 DIAGNOSIS — F17210 Nicotine dependence, cigarettes, uncomplicated: Secondary | ICD-10-CM | POA: Diagnosis present

## 2017-01-18 DIAGNOSIS — Z7951 Long term (current) use of inhaled steroids: Secondary | ICD-10-CM | POA: Diagnosis not present

## 2017-01-18 DIAGNOSIS — Z79899 Other long term (current) drug therapy: Secondary | ICD-10-CM | POA: Diagnosis not present

## 2017-01-18 DIAGNOSIS — J9601 Acute respiratory failure with hypoxia: Secondary | ICD-10-CM | POA: Diagnosis present

## 2017-01-18 DIAGNOSIS — Z8673 Personal history of transient ischemic attack (TIA), and cerebral infarction without residual deficits: Secondary | ICD-10-CM | POA: Diagnosis not present

## 2017-01-18 DIAGNOSIS — F329 Major depressive disorder, single episode, unspecified: Secondary | ICD-10-CM | POA: Diagnosis present

## 2017-01-18 DIAGNOSIS — J209 Acute bronchitis, unspecified: Secondary | ICD-10-CM | POA: Diagnosis present

## 2017-01-18 LAB — HIV ANTIBODY (ROUTINE TESTING W REFLEX): HIV Screen 4th Generation wRfx: NONREACTIVE

## 2017-01-18 MED ORDER — TIOTROPIUM BROMIDE MONOHYDRATE 18 MCG IN CAPS
18.0000 ug | ORAL_CAPSULE | Freq: Every day | RESPIRATORY_TRACT | Status: DC
Start: 2017-01-19 — End: 2017-01-21
  Administered 2017-01-20 – 2017-01-21 (×2): 18 ug via RESPIRATORY_TRACT
  Filled 2017-01-18: qty 5

## 2017-01-18 NOTE — Progress Notes (Signed)
[facility] [place] NAME: [name]    MR#:  [id number]  DATE OF BIRTH:  [birth year]  SUBJECTIVE:   Patient Still with shortness of breath and wheezing Oxygen level decreased yesterday. Patient is back on O2  REVIEW OF SYSTEMS:    Review of Systems  Constitutional: Negative for fever, chills weight loss HENT: Negative for ear pain, nosebleeds, congestion, facial swelling, rhinorrhea, neck pain, neck stiffness and ear discharge.   Respiratory: Positive for cough, shortness of breath and wheezing Cardiovascular: Negative for chest pain, palpitations and leg swelling.  Gastrointestinal: Negative for heartburn, abdominal pain, vomiting, diarrhea or consitpation Genitourinary: Negative for dysuria, urgency, frequency, hematuria Musculoskeletal: Negative for back pain or joint pain Neurological: Negative for dizziness, seizures, syncope, focal weakness,  numbness and headaches.  Hematological: Does not bruise/bleed easily.  Psychiatric/Behavioral: Negative for hallucinations, confusion, dysphoric mood    Tolerating Diet: yes      DRUG ALLERGIES:  No Known Allergies  VITALS:  Blood pressure (!) 136/95, pulse 87, temperature 97.6 F (36.4 C), temperature source Oral, resp. rate 18, height 6\' 1"  (1.854 m), weight 117 kg (258 lb), SpO2 92 %.  PHYSICAL EXAMINATION:  Constitutional: Appears well-developed and well-nourished. No distress. HENT: Normocephalic. Marland Kitchen Oropharynx is clear and moist.  Eyes: Conjunctivae and EOM are normal. PERRLA, no scleral icterus.  Neck: Normal ROM. Neck supple. No JVD. No tracheal deviation. CVS: RRR, S1/S2 +, no murmurs, no gallops, no carotid bruit.  Pulmonary: Normal effort with bilateral prolonged expiratory wheezing no rhonchi or rales.  Abdominal: Soft. BS +,  no distension, tenderness, rebound or guarding.  Musculoskeletal: Normal range of motion. No edema and no tenderness.  Neuro: Alert. CN 2-12  grossly intact. No focal deficits. Skin: Skin is warm and dry. No rash noted. Psychiatric: Normal mood and affect.      LABORATORY PANEL:   CBC  Recent Labs Lab 01/17/17 0452  WBC 9.3  HGB 14.1  HCT 41.5  PLT 200   ------------------------------------------------------------------------------------------------------------------  Chemistries   Recent Labs Lab 01/16/17 0903 01/17/17 0452  NA 140 136  K 3.6 4.1  CL 104 103  CO2 29 24  GLUCOSE 99 130*  BUN 9 9  CREATININE 0.69 0.48*  CALCIUM 9.6 9.3  MG 1.9  --    ------------------------------------------------------------------------------------------------------------------  Cardiac Enzymes  Recent Labs Lab 01/16/17 0903  TROPONINI <0.03   ------------------------------------------------------------------------------------------------------------------  RADIOLOGY:  No results found.   ASSESSMENT AND PLAN:   56 year old male with history of tobacco dependence and COPD who presents with acute exacerbation of COPD.  1. Acute hypoxic respiratory failure in the setting of COPD exacerbation: Wean oxygen as tolerated. It is likely patient will need oxygen at discharge   2. Acute COPD exacerbation with acute bronchitis: Patient continues to have bilateral prolonged expiratory wheezing, however improved since admission, but not ready for discharge Continue current dose of IV steroids and wean as tolerated.  Continue nebs Continue azithromycin  3.Tobacco dependence: Patient is encouraged to quit smoking. Counseling was provided. 4. Hyperlipidemia: Continue pravastatin  5. Schizophrenia: Continue Seroquel and Remeron   PT evaluation for discharge planning   Management plans discussed with the patient and he is in agreement.  CODE STATUS: full  TOTAL TIME TAKING CARE OF THIS PATIENT: 24 minutes.     POSSIBLE D/C 1-2 days , DEPENDING ON CLINICAL CONDITION.   Arzell Mcgeehan M.D on 01/18/2017 at 10:18  AM  Between 7am to 6pm - Pager - [phone]  After 6pm go to [email or web address] - password [email or web address] [facility]  Office  [phone]  CC: Primary care physician; Donnie Coffin, MD  Note: This dictation was prepared with Dragon dictation along with smaller phrase technology. Any transcriptional errors that result from this process are unintentional.

## 2017-01-18 NOTE — Progress Notes (Signed)
Clinical Education officer, museum (CSW) contacted [facility] group home administrator and made her aware that patient may D/C with oxygen. [name] reported that patient can return to the group home with oxygen. RN case manager aware of above. CSW will continue to follow and assist as needed.   [employer], LCSW [phone]

## 2017-01-19 MED ORDER — MELATONIN 5 MG PO TABS
5.0000 mg | ORAL_TABLET | Freq: Every evening | ORAL | Status: DC | PRN
  Administered 2017-01-19 – 2017-01-20 (×2): 5 mg via ORAL
  Filled 2017-01-19 (×3): qty 1

## 2017-01-19 NOTE — Progress Notes (Signed)
Dr. Ether Griffins notified of patient's request for home med for sleep; Acknowledged; Melatonin ordered as at home. Barbaraann Faster, RN7:32 PM 01/19/2017

## 2017-01-19 NOTE — Progress Notes (Signed)
[name] at [place] NAME: [name]    MR#:  [id number]  DATE OF BIRTH:  Jun 21, 1961  SUBJECTIVE:   Patient Still with wheezing this morning. He is ambulating however does feel shortness of breath times  REVIEW OF SYSTEMS:    Review of Systems  Constitutional: Negative for fever, chills weight loss HENT: Negative for ear pain, nosebleeds, congestion, facial swelling, rhinorrhea, neck pain, neck stiffness and ear discharge.   Respiratory: Positive for cough, shortness of breath and wheezing Cardiovascular: Negative for chest pain, palpitations and leg swelling.  Gastrointestinal: Negative for heartburn, abdominal pain, vomiting, diarrhea or consitpation Genitourinary: Negative for dysuria, urgency, frequency, hematuria Musculoskeletal: Negative for back pain or joint pain Neurological: Negative for dizziness, seizures, syncope, focal weakness,  numbness and headaches.  Hematological: Does not bruise/bleed easily.  Psychiatric/Behavioral: Negative for hallucinations, confusion, dysphoric mood    Tolerating Diet: yes      DRUG ALLERGIES:  No Known Allergies  VITALS:  Blood pressure 125/89, pulse 95, temperature 98.9 F (37.2 C), temperature source Oral, resp. rate 18, height 6\' 1"  (1.854 m), weight 117 kg (258 lb), SpO2 92 %.  PHYSICAL EXAMINATION:  Constitutional: Appears well-developed and well-nourished. No distress. HENT: Normocephalic. Marland Kitchen Oropharynx is clear and moist.  Eyes: Conjunctivae and EOM are normal. PERRLA, no scleral icterus.  Neck: Normal ROM. Neck supple. No JVD. No tracheal deviation. CVS: RRR, S1/S2 +, no murmurs, no gallops, no carotid bruit.  Pulmonary: Normal effort with bilateral prolonged expiratory wheezing no rhonchi or rales.  Abdominal: Soft. BS +,  no distension, tenderness, rebound or guarding.  Musculoskeletal: Normal range of motion. No edema and no tenderness.  Neuro: Alert. CN 2-12 grossly intact.  No focal deficits. Skin: Skin is warm and dry. No rash noted. Psychiatric: Normal mood and affect.      LABORATORY PANEL:   CBC  Recent Labs Lab 01/17/17 0452  WBC 9.3  HGB 14.1  HCT 41.5  PLT 200   ------------------------------------------------------------------------------------------------------------------  Chemistries   Recent Labs Lab 01/16/17 0903 01/17/17 0452  NA 140 136  K 3.6 4.1  CL 104 103  CO2 29 24  GLUCOSE 99 130*  BUN 9 9  CREATININE 0.69 0.48*  CALCIUM 9.6 9.3  MG 1.9  --    ------------------------------------------------------------------------------------------------------------------  Cardiac Enzymes  Recent Labs Lab 01/16/17 0903  TROPONINI <0.03   ------------------------------------------------------------------------------------------------------------------  RADIOLOGY:  No results found.   ASSESSMENT AND PLAN:   56 year old male with history of tobacco dependence and COPD who presents with acute exacerbation of COPD.  1. Acute hypoxic respiratory failure in the setting of COPD exacerbation: Wean oxygen as tolerated. Patient may require oxygen at discharge.  2. Acute COPD exacerbation with acute bronchitis: Patient still with prolonged wheezing on expiration.  Continue current dose of IV steroids and perhaps will wean tomorrow. Continue nebs Continue azithromycin day 4/5  3.Tobacco dependence: Patient is encouraged to quit smoking. Counseling was provided. 4. Hyperlipidemia: Continue pravastatin  5. Schizophrenia: Continue Seroquel and Remeron   Physical therapy is recommending no PT follow-up at discharge.   Management plans discussed with the patient and he is in agreement.  CODE STATUS: full  TOTAL TIME TAKING CARE OF THIS PATIENT: 22 minutes.     POSSIBLE D/C 1-2 days , DEPENDING ON CLINICAL CONDITION.   Pinki Rottman M.D on 01/19/2017 at 8:26 AM  Between 7am to 6pm - Pager - [phone] After 6pm go  to [email or web address] - [place]  [phone]  CC: Primary care physician; Donnie Coffin, MD  Note: This dictation was prepared with Dragon dictation along with smaller phrase technology. Any transcriptional errors that result from this process are unintentional.

## 2017-01-19 NOTE — Progress Notes (Signed)
PT Cancellation Note  Patient Details Name: [name] MRN: [id number] DOB: Oct 22, 1960   Cancelled Treatment:    Reason Eval/Treat Not Completed: Patient declined, no reason specified   Pt approached x 2 for therapy session.  At 9:05 pt resting in bed with 2 lpm O2 sats at 88%.  Primary nurse notified and pt was awaiting inhalers from pharmacy.  Returned at 10:58.  Pt would respond verbally but did not open eyes for interaction and declined any attempt at therapy session.  Sats 89% on 2 lpm.  Will continue as appropriate.   Chesley Noon 01/19/2017, 11:00 AM

## 2017-01-20 ENCOUNTER — Inpatient Hospital Stay: Payer: Medicare Other

## 2017-01-20 LAB — CREATININE, SERUM: Creatinine, Ser: 0.48 mg/dL — ABNORMAL LOW (ref 0.61–1.24)

## 2017-01-20 NOTE — Progress Notes (Addendum)
Per MD note patient will not D/C today. Plan is for patient for D/C back to [facility] group [facility] in [place] when stable. Patient may need oxygen at discharge, group home owner [name] is aware of that. RN case manager will arrange oxygen if needed. CSW contacted [name] today and left her a voicemail making her aware that patient may D/C this weekend. CSW will continue to follow and assist as needed.   CSW contacted [name] this afternoon who stated that patient can return to the group home when stable and the best number to reach her is at [phone].   [employer], LCSW [phone]

## 2017-01-20 NOTE — Progress Notes (Signed)
SATURATION QUALIFICATIONS: (This note is used to comply with regulatory documentation for home oxygen)  Patient Saturations on Room Air at Rest = 88%  Patient Saturations on Room Air while Ambulating = %  Patient Saturations on  Liters of oxygen while Ambulating = %  Please briefly explain why patient needs home oxygen:

## 2017-01-20 NOTE — Progress Notes (Signed)
Physical Therapy Treatment Patient Details Name: [name] MRN: [id number] DOB: 11-07-1960 Today's Date: 01/20/2017    History of Present Illness Pt presented to ER secondary to cough, wheezing, SOB; admitted with acute hypoxic respiratory failure related to COPD exacerbation.  Currently on 2L supplemental O2.    PT Comments    Upon PT entering room, pt stating he needed to go to the bathroom and quickly got out of bed and took off his oxygen.  PT attempted to stop pt and place oxygen back on but pt refused and kept walking to bathroom.  Pt's O2 noted to be 88% upon returning to sitting edge of bed after toileting.  2 L O2 placed back on pt and pt's O2 sats noted to decrease to 86% initially but with vc's for pursed lip breathing O2 increased to 91%.  Attempted to walk pt on 2 L O2 via nasal cannula but after 10 feet pt's O2 sats decreased to 88% so pt brought back to recliner chair in room to sit down and with vc's for pursed lip breathing O2 increased back to 91%.  Nursing notified of above vitals.  Pt appearing impulsive and quick with functional mobility but overall steady without any noted loss of balance.  Will continue to progress pt with ambulation with focus on monitoring pt's O2 sats (pulmonary tolerance) to functional mobility.    Follow Up Recommendations  No PT follow up (will maintain on caseload throughout remaining hospitalization to monitor cardiopulm response to activity; anticipate no PT needs upon discharge)     Equipment Recommendations  None recommended by PT    Recommendations for Other Services       Precautions / Restrictions Precautions Precautions: Fall Restrictions Weight Bearing Restrictions: No    Mobility  Bed Mobility Overal bed mobility: Modified Independent             General bed mobility comments: Supine to sit with HOB elevated without any difficulties.  Transfers Overall transfer level: Independent Equipment used: None              General transfer comment: Sit to/from stand and stand step turn without any difficulties.  Ambulation/Gait Ambulation/Gait assistance: Independent Ambulation Distance (Feet):  (20 feet x2; 30 feet) Assistive device: None Gait Pattern/deviations: WFL(Within Functional Limits);Step-through pattern     General Gait Details: quick movement but steady with good trunk rotation and arm swing   Stairs            Wheelchair Mobility    Modified Rankin (Stroke Patients Only)       Balance Overall balance assessment: Modified Independent                                          Cognition Arousal/Alertness: Awake/alert Behavior During Therapy: WFL for tasks assessed/performed Overall Cognitive Status: Within Functional Limits for tasks assessed                                        Exercises      General Comments  Pt agreeable to limited PT session.       Pertinent Vitals/Pain Pain Assessment: No/denies pain  HR WFL during session.    Home Living  Prior Function            PT Goals (current goals can now be found in the care plan section) Acute Rehab PT Goals Patient Stated Goal: to return to group home PT Goal Formulation: With patient Time For Goal Achievement: 01/31/17 Potential to Achieve Goals: Good Progress towards PT goals: Progressing toward goals    Frequency    Min 2X/week      PT Plan Current plan remains appropriate    Co-evaluation              AM-PAC PT "6 Clicks" Daily Activity  Outcome Measure  Difficulty turning over in bed (including adjusting bedclothes, sheets and blankets)?: None Difficulty moving from lying on back to sitting on the side of the bed? : None Difficulty sitting down on and standing up from a chair with arms (e.g., wheelchair, bedside commode, etc,.)?: None Help needed moving to and from a bed to chair (including a wheelchair)?: None Help  needed walking in hospital room?: None Help needed climbing 3-5 steps with a railing? : A Little 6 Click Score: 23    End of Session Equipment Utilized During Treatment: Gait belt;Oxygen Activity Tolerance: Patient tolerated treatment well Patient left: in chair;with call bell/phone within reach;with chair alarm set Nurse Communication: Mobility status (Pt's O2 status during session) PT Visit Diagnosis: Difficulty in walking, not elsewhere classified (R26.2)     Time: 8003-4917 PT Time Calculation (min) (ACUTE ONLY): 16 min  Charges:  $Therapeutic Activity: 8-22 mins                    G CodesLeitha Bleak, PT 01/20/17, 12:22 PM [phone]

## 2017-01-20 NOTE — Progress Notes (Addendum)
[place] at [place] NAME: [name]    MR#:  [id number]  DATE OF BIRTH:  1961/02/23  SUBJECTIVE:   Still with SOB and wheezing  REVIEW OF SYSTEMS:    Review of Systems  Constitutional: Negative for fever, chills weight loss HENT: Negative for ear pain, nosebleeds, congestion, facial swelling, rhinorrhea, neck pain, neck stiffness and ear discharge.   Respiratory: Positive for cough, shortness of breath and wheezing Cardiovascular: Negative for chest pain, palpitations and leg swelling.  Gastrointestinal: Negative for heartburn, abdominal pain, vomiting, diarrhea or consitpation Genitourinary: Negative for dysuria, urgency, frequency, hematuria Musculoskeletal: Negative for back pain or joint pain Neurological: Negative for dizziness, seizures, syncope, focal weakness,  numbness and headaches.  Hematological: Does not bruise/bleed easily.  Psychiatric/Behavioral: Negative for hallucinations, confusion, dysphoric mood    Tolerating Diet: yes      DRUG ALLERGIES:  No Known Allergies  VITALS:  Blood pressure (!) 143/94, pulse 98, temperature 97.9 F (36.6 C), temperature source Oral, resp. rate 20, height 6\' 1"  (1.854 m), weight 117 kg (258 lb), SpO2 95 %.  PHYSICAL EXAMINATION:  Constitutional: Appears well-developed and well-nourished. No distress. HENT: Normocephalic. Marland Kitchen Oropharynx is clear and moist.  Eyes: Conjunctivae and EOM are normal. PERRLA, no scleral icterus.  Neck: Normal ROM. Neck supple. No JVD. No tracheal deviation. CVS: RRR, S1/S2 +, no murmurs, no gallops, no carotid bruit.  Pulmonary: Normal effort with bilateral prolonged expiratory wheezing no rhonchi or rales.  Abdominal: Soft. BS +,  no distension, tenderness, rebound or guarding.  Musculoskeletal: Normal range of motion. No edema and no tenderness.  Neuro: Alert. CN 2-12 grossly intact. No focal deficits. Skin: Skin is warm and dry. No rash  noted. Psychiatric: Normal mood and affect.      LABORATORY PANEL:   CBC  Recent Labs Lab 01/17/17 0452  WBC 9.3  HGB 14.1  HCT 41.5  PLT 200   ------------------------------------------------------------------------------------------------------------------  Chemistries   Recent Labs Lab 01/16/17 0903 01/17/17 0452  NA 140 136  K 3.6 4.1  CL 104 103  CO2 29 24  GLUCOSE 99 130*  BUN 9 9  CREATININE 0.69 0.48*  CALCIUM 9.6 9.3  MG 1.9  --    ------------------------------------------------------------------------------------------------------------------  Cardiac Enzymes  Recent Labs Lab 01/16/17 0903  TROPONINI <0.03   ------------------------------------------------------------------------------------------------------------------  RADIOLOGY:  No results found.   ASSESSMENT AND PLAN:   56 year old male with history of tobacco dependence and COPD who presents with acute exacerbation of COPD.  1. Acute hypoxic respiratory failure in the setting of COPD exacerbation:  Patient still with increased oxygen requirement. Patient will need oxygen at discharge  2. Acute COPD exacerbation with acute bronchitis: Patient still with prolonged wheezing on expiration.  Continue current dose of IV steroids. We'll consult pulmonary for further evaluation and recommendations  Continue nebs and inhalers Azithromycin day 5 of 5 Check chest x-ray  3.Tobacco dependence: Patient is encouraged to quit smoking. Counseling was provided. 4. Hyperlipidemia: Continue pravastatin  5. Schizophrenia: Continue Seroquel and Remeron   Physical therapy is recommending no PT follow-up at discharge.   Management plans discussed with the patient and he is in agreement.  CODE STATUS: full  TOTAL TIME TAKING CARE OF THIS PATIENT: 22 minutes.     POSSIBLE D/C 1-2 days , DEPENDING ON CLINICAL CONDITION.   [name] M.D on 01/20/2017 at 7:15 AM  Between 7am to 6pm - Pager  - [phone] After 6pm go to [email or web address] -  password [email or web address] [facility]  Office  [phone]  CC: Primary care physician; Donnie Coffin, MD  Note: This dictation was prepared with Dragon dictation along with smaller phrase technology. Any transcriptional errors that result from this process are unintentional.

## 2017-01-20 NOTE — Progress Notes (Signed)
Patient is AxOx4. Meds added to aid in bowel motility. Continues on 2L of O2, desat to 88%

## 2017-01-21 MED ORDER — PREDNISONE 10 MG PO TABS: 10.0000 mg | ORAL_TABLET | Freq: Every day | ORAL | 0 refills | Status: DC

## 2017-01-21 MED ORDER — NICOTINE 14 MG/24HR TD PT24: 14.0000 mg | MEDICATED_PATCH | Freq: Every day | TRANSDERMAL | 0 refills | Status: DC

## 2017-01-21 MED ORDER — TIOTROPIUM BROMIDE MONOHYDRATE 18 MCG IN CAPS: 18.0000 ug | ORAL_CAPSULE | Freq: Every day | RESPIRATORY_TRACT | 12 refills | Status: DC

## 2017-01-21 NOTE — Progress Notes (Signed)
Patient discharging to [facility]. Instructions and prescriptions placed in packet. Someone from the group home is on their way to pick up patient for transport. Oxygen tank has been delivered to the bedside. Continue to monitor.

## 2017-01-21 NOTE — Discharge Summary (Signed)
[facility] [place] NAME: [name]    MR#:  [id number]  DATE OF BIRTH:  08-19-1960  DATE OF ADMISSION:  01/16/2017 ADMITTING PHYSICIAN: Hillary Bow, MD  DATE OF DISCHARGE: 01/21/2017  PRIMARY CARE PHYSICIAN: Donnie Coffin, MD    ADMISSION DIAGNOSIS:  Acute respiratory failure with hypoxia ([place]) [J96.01] Chronic obstructive pulmonary disease, unspecified COPD type ([place]) [J44.9]  DISCHARGE DIAGNOSIS:  Active Problems:   COPD exacerbation ([place])   SECONDARY DIAGNOSIS:   Past Medical History:  Diagnosis Date  . COPD (chronic obstructive pulmonary disease) ([place])   . Depression   . History of hiatal hernia   . Hypertension   . Pneumonia   . Schizophrenia ([place])   . Shortness of breath dyspnea   . Stroke [facility])     HOSPITAL COURSE:   56 year old male with history of tobacco dependence and COPD who presents with acute exacerbation of COPD.  1. Acute hypoxic respiratory failure in the setting of COPD exacerbation:  Patient still with increased oxygen requirement. Patient will need oxygen at discharge  2. Acute COPD exacerbation with acute bronchitis:Patient has completed course with azithromycin for acute bursitis. Patient has some wheezing on examination today at discharge however he has good airflow. Shortness of breath, cough and wheezing improved since admission. He will be discharged on prednisone taper.   3.Tobacco dependence: Patient is encouraged to quit smoking. Counseling was provided. He will be discharged on nicotine patch 4. Hyperlipidemia: Continue pravastatin  5. Schizophrenia: Continue Seroquel and Remeron   DISCHARGE CONDITIONS AND DIET:   Stable for discharge on heart healthy diet  CONSULTS OBTAINED:  Treatment Team:  Erby Pian, MD  DRUG ALLERGIES:  No Known Allergies  DISCHARGE MEDICATIONS:   Current Discharge Medication List    START taking these medications   Details  nicotine  (NICODERM CQ - DOSED IN MG/24 HOURS) 14 mg/24hr patch Place 1 patch (14 mg total) onto the skin daily. Qty: 28 patch, Refills: 0    tiotropium (SPIRIVA) 18 MCG inhalation capsule Place 1 capsule (18 mcg total) into inhaler and inhale daily. Qty: 30 capsule, Refills: 12      CONTINUE these medications which have CHANGED   Details  predniSONE (DELTASONE) 10 MG tablet Take 1 tablet (10 mg total) by mouth daily with breakfast. 60 mg PO (ORAL) x 2 days 50 mg PO (ORAL)  x 2 days 40 mg PO (ORAL)  x 2 days 30 mg PO  (ORAL)  x 2 days 20 mg PO  (ORAL) x 2 days 10 mg PO  (ORAL) x 2 days then stop Qty: 42 tablet, Refills: 0      CONTINUE these medications which have NOT CHANGED   Details  amantadine (SYMMETREL) 100 MG capsule Take 100 mg by mouth 2 (two) times daily.    budesonide-formoterol (SYMBICORT) 160-4.5 MCG/ACT inhaler Inhale 2 puffs into the lungs 2 (two) times daily.    docusate sodium (COLACE) 100 MG capsule Take 100 mg by mouth 2 (two) times daily as needed for mild constipation.    gabapentin (NEURONTIN) 300 MG capsule Take 300 mg by mouth 3 (three) times daily.    guaiFENesin (MUCINEX) 600 MG 12 hr tablet Take 600 mg by mouth 2 (two) times daily.    hydrochlorothiazide (HYDRODIURIL) 12.5 MG tablet Take 12.5 mg by mouth daily.    lovastatin (MEVACOR) 20 MG tablet Take 20 mg by mouth at bedtime.    Melatonin 5 MG TABS Take  1 tablet by mouth at bedtime as needed.    mirtazapine (REMERON) 30 MG tablet Take 30 mg by mouth at bedtime.    Omega-3 Fatty Acids (FISH OIL) 1000 MG CPDR Take by mouth.    Paliperidone Palmitate (INVEGA SUSTENNA IM) every 3 (three) months.     polyethylene glycol (MIRALAX / GLYCOLAX) packet Take 17 g by mouth daily.    QUEtiapine (SEROQUEL) 200 MG tablet Take 200 mg by mouth at bedtime.    Vitamin D, Ergocalciferol, (DRISDOL) 50000 UNITS CAPS capsule Take by mouth.    acetaminophen (TYLENOL) 325 MG tablet Take 650 mg by mouth 2 (two) times daily  as needed for mild pain.    albuterol (PROVENTIL HFA;VENTOLIN HFA) 108 (90 BASE) MCG/ACT inhaler Inhale 2 puffs into the lungs every 4 (four) hours as needed for wheezing or shortness of breath.    ipratropium-albuterol (DUONEB) 0.5-2.5 (3) MG/3ML SOLN Take 3 mLs by nebulization every 4 (four) hours as needed (every 4 to 6 hours as needed for shortnes of breath or wheeziing).      STOP taking these medications     oxyCODONE-acetaminophen (PERCOCET/ROXICET) 5-325 MG per tablet           Today   CHIEF COMPLAINT:   Patient feels better this morning. Less short of breath and wheezing   VITAL SIGNS:  Blood pressure (!) 134/94, pulse 86, temperature 98.6 F (37 C), temperature source Oral, resp. rate 18, height 6\' 1"  (1.854 m), weight 117 kg (258 lb), SpO2 92 %.   REVIEW OF SYSTEMS:  Review of Systems  Constitutional: Negative.  Negative for chills, fever and malaise/fatigue.  HENT: Negative.  Negative for ear discharge, ear pain, hearing loss, nosebleeds and sore throat.   Eyes: Negative.  Negative for blurred vision and pain.  Respiratory: Positive for cough, shortness of breath (better) and wheezing (better). Negative for hemoptysis.   Cardiovascular: Negative.  Negative for chest pain, palpitations and leg swelling.  Gastrointestinal: Negative.  Negative for abdominal pain, blood in stool, diarrhea, nausea and vomiting.  Genitourinary: Negative.  Negative for dysuria.  Musculoskeletal: Negative.  Negative for back pain.  Skin: Negative.   Neurological: Negative for dizziness, tremors, speech change, focal weakness, seizures and headaches.  Endo/Heme/Allergies: Negative.  Does not bruise/bleed easily.  Psychiatric/Behavioral: Negative.  Negative for depression, hallucinations and suicidal ideas.     PHYSICAL EXAMINATION:  GENERAL:  56 y.o.-year-old patient lying in the bed with no acute distress.  NECK:  Supple, no jugular venous distention. No thyroid enlargement, no  tenderness.  LUNGS: Normal respiratory effort with bilateral wheezing much improved since admission. Good airflow CARDIOVASCULAR: S1, S2 normal. No murmurs, rubs, or gallops.  ABDOMEN: Soft, non-tender, non-distended. Bowel sounds present. No organomegaly or mass.  EXTREMITIES: No pedal edema, cyanosis, or clubbing.  PSYCHIATRIC: The patient is alert and oriented x 3.  SKIN: No obvious rash, lesion, or ulcer.   DATA REVIEW:   CBC  Recent Labs Lab 01/17/17 0452  WBC 9.3  HGB 14.1  HCT 41.5  PLT 200    Chemistries   Recent Labs Lab 01/16/17 0903 01/17/17 0452 01/20/17 0753  NA 140 136  --   K 3.6 4.1  --   CL 104 103  --   CO2 29 24  --   GLUCOSE 99 130*  --   BUN 9 9  --   CREATININE 0.69 0.48* 0.48*  CALCIUM 9.6 9.3  --   MG 1.9  --   --  Cardiac Enzymes  Recent Labs Lab 01/16/17 0903  TROPONINI <0.03    Microbiology Results  @MICRORSLT48 @  RADIOLOGY:  Dg Chest 1 View  Result Date: 01/20/2017 CLINICAL DATA:  Increasing shortness of Breath EXAM: CHEST 1 VIEW COMPARISON:  01/16/2017 FINDINGS: Cardiac shadow is within normal limits. Mild right basilar atelectasis is seen. The previously noted nodular density is not well appreciated on this exam and likely was artifactual in nature. No bony abnormality is noted. IMPRESSION: Right basilar atelectasis. Electronically Signed   By: Inez Catalina M.D.   On: 01/20/2017 08:01      Current Discharge Medication List    START taking these medications   Details  nicotine (NICODERM CQ - DOSED IN MG/24 HOURS) 14 mg/24hr patch Place 1 patch (14 mg total) onto the skin daily. Qty: 28 patch, Refills: 0    tiotropium (SPIRIVA) 18 MCG inhalation capsule Place 1 capsule (18 mcg total) into inhaler and inhale daily. Qty: 30 capsule, Refills: 12      CONTINUE these medications which have CHANGED   Details  predniSONE (DELTASONE) 10 MG tablet Take 1 tablet (10 mg total) by mouth daily with breakfast. 60 mg PO (ORAL) x 2  days 50 mg PO (ORAL)  x 2 days 40 mg PO (ORAL)  x 2 days 30 mg PO  (ORAL)  x 2 days 20 mg PO  (ORAL) x 2 days 10 mg PO  (ORAL) x 2 days then stop Qty: 42 tablet, Refills: 0      CONTINUE these medications which have NOT CHANGED   Details  amantadine (SYMMETREL) 100 MG capsule Take 100 mg by mouth 2 (two) times daily.    budesonide-formoterol (SYMBICORT) 160-4.5 MCG/ACT inhaler Inhale 2 puffs into the lungs 2 (two) times daily.    docusate sodium (COLACE) 100 MG capsule Take 100 mg by mouth 2 (two) times daily as needed for mild constipation.    gabapentin (NEURONTIN) 300 MG capsule Take 300 mg by mouth 3 (three) times daily.    guaiFENesin (MUCINEX) 600 MG 12 hr tablet Take 600 mg by mouth 2 (two) times daily.    hydrochlorothiazide (HYDRODIURIL) 12.5 MG tablet Take 12.5 mg by mouth daily.    lovastatin (MEVACOR) 20 MG tablet Take 20 mg by mouth at bedtime.    Melatonin 5 MG TABS Take 1 tablet by mouth at bedtime as needed.    mirtazapine (REMERON) 30 MG tablet Take 30 mg by mouth at bedtime.    Omega-3 Fatty Acids (FISH OIL) 1000 MG CPDR Take by mouth.    Paliperidone Palmitate (INVEGA SUSTENNA IM) every 3 (three) months.     polyethylene glycol (MIRALAX / GLYCOLAX) packet Take 17 g by mouth daily.    QUEtiapine (SEROQUEL) 200 MG tablet Take 200 mg by mouth at bedtime.    Vitamin D, Ergocalciferol, (DRISDOL) 50000 UNITS CAPS capsule Take by mouth.    acetaminophen (TYLENOL) 325 MG tablet Take 650 mg by mouth 2 (two) times daily as needed for mild pain.    albuterol (PROVENTIL HFA;VENTOLIN HFA) 108 (90 BASE) MCG/ACT inhaler Inhale 2 puffs into the lungs every 4 (four) hours as needed for wheezing or shortness of breath.    ipratropium-albuterol (DUONEB) 0.5-2.5 (3) MG/3ML SOLN Take 3 mLs by nebulization every 4 (four) hours as needed (every 4 to 6 hours as needed for shortnes of breath or wheeziing).      STOP taking these medications     oxyCODONE-acetaminophen  (PERCOCET/ROXICET) 5-325 MG per tablet  Management plans discussed with the patient and he is in agreement. Stable for discharge   Patient should follow up with pcp  CODE STATUS:     Code Status Orders        Start     Ordered   01/16/17 1357  Full code  Continuous     01/16/17 1359    Code Status History    Date Active Date Inactive Code Status Order ID Comments User Context   12/18/2014 11:51 AM 12/24/2014  2:34 PM Full Code [id number]  Florene Glen, MD Inpatient    Advance Directive Documentation     Most Recent Value  Type of Advance Directive  Healthcare Power of [place], Living will [Gail Thompson/sister/POA]  Pre-existing out of facility DNR order (yellow form or pink MOST form)  -  "MOST" Form in Place?  -      TOTAL TIME TAKING CARE OF THIS PATIENT: 37 minutes.    Note: This dictation was prepared with Dragon dictation along with smaller phrase technology. Any transcriptional errors that result from this process are unintentional.  Kelven Flater M.D on 01/21/2017 at 8:52 AM  Between 7am to 6pm - Pager - [phone] After 6pm go to [email or web address] - password [email or web address] [facility]  Office  [phone]  CC: Primary care physician; Donnie Coffin, MD

## 2017-01-21 NOTE — Clinical Social Work Note (Signed)
The patient will discharge today to his group home, [facility]. The CSW and RNCM have attempted to contact both the facility and the facility administrator to facilitate the transportation and alert them of the impending discharge. Neither has answered. The CSWl and RNCM each left voicemail for the administrator. Paperwork for the discharge has been sent to the facility with a message alerting them to call the CSW. CSW will continue to follow pending additional discharge needs.  Santiago Bumpers, MSW, Latanya Presser [phone]

## 2017-01-21 NOTE — Care Management Note (Addendum)
Case Management Note  Patient Details  Name: [name] MRN: [id number] Date of Birth: Jan 30, 1961  Subjective/Objective:       Message left on phone of [facility] Administrator requesting a call back. An order for new home oxygen per COPD exacerbation was called to [facility] No home health services ordered or recommended by [facility]-PT.  Tried and failed prior to this order for new oxygen: Chronic Duoneb via home nebulizer machine (has home nebulizer machine at his group home), Albuterol neb, Symbicort inhaler, and Spiriva Capsule in an inhaler.              Action/Plan:   Expected Discharge Date:  01/21/17               Expected Discharge Plan:   01/21/17  In-House Referral:     Discharge planning Services     Post Acute Care Choice:   Yes Choice offered to:   Patient/Group Home  DME Arranged:  Oxygen DME Agency:   [facility]  HH Arranged:    HH Agency:     Status of Service:   Completed.  If discussed at [place] of Stay Meetings, dates discussed:    Additional Comments:  Gean Laursen A, RN 01/21/2017, 9:39 AM

## 2018-10-11 ENCOUNTER — Ambulatory Visit (INDEPENDENT_AMBULATORY_CARE_PROVIDER_SITE_OTHER): Payer: Medicare Other | Admitting: Podiatry

## 2018-10-11 ENCOUNTER — Encounter: Payer: Self-pay | Admitting: Podiatry

## 2018-10-11 VITALS — BP 137/91 | HR 78

## 2018-10-11 DIAGNOSIS — M79674 Pain in right toe(s): Secondary | ICD-10-CM | POA: Diagnosis not present

## 2018-10-11 DIAGNOSIS — B351 Tinea unguium: Secondary | ICD-10-CM | POA: Diagnosis not present

## 2018-10-11 DIAGNOSIS — M79675 Pain in left toe(s): Secondary | ICD-10-CM

## 2018-10-11 NOTE — Progress Notes (Signed)
Complaint:  Visit Type: Patient presents  to my office for  preventative foot care services. Complaint: Patient states" my nails have grown long and thick and become painful to walk and wear shoes"  The patient presents for preventative foot care services. No changes to ROS.  Patient is taking gabapentin.  Podiatric Exam: Vascular: dorsalis pedis  are palpable bilateral.  Posterior tibial pulses are weakly palpable  B/L. Capillary return is immediate. Temperature gradient is WNL. Skin turgor WNL  Sensorium: Normal Semmes Weinstein monofilament test. Normal tactile sensation bilaterally. Nail Exam: Pt has thick disfigured discolored nails with subungual debris noted bilateral entire nail hallux through fifth toenails Ulcer Exam: There is no evidence of ulcer or pre-ulcerative changes or infection. Orthopedic Exam: Muscle tone and strength are WNL. No limitations in general ROM. No crepitus or effusions noted. Foot type and digits show no abnormalities. HAV  B/L Skin: No Porokeratosis. No infection or ulcers  Diagnosis:  Onychomycosis, , Pain in right toe, pain in left toes  Treatment & Plan Procedures and Treatment: Consent by patient was obtained for treatment procedures.   Debridement of mycotic and hypertrophic toenails, 1 through 5 bilateral and clearing of subungual debris. No ulceration, no infection noted.  Return Visit-Office Procedure: Patient instructed to return to the office for a follow up visit 4 months for continued evaluation and treatment.    Gardiner Barefoot DPM

## 2019-02-14 ENCOUNTER — Other Ambulatory Visit: Payer: Self-pay

## 2019-02-14 ENCOUNTER — Encounter: Payer: Self-pay | Admitting: Podiatry

## 2019-02-14 ENCOUNTER — Ambulatory Visit (INDEPENDENT_AMBULATORY_CARE_PROVIDER_SITE_OTHER): Payer: Medicare Other | Admitting: Podiatry

## 2019-02-14 ENCOUNTER — Ambulatory Visit: Payer: Medicare Other | Admitting: Podiatry

## 2019-02-14 DIAGNOSIS — M79674 Pain in right toe(s): Secondary | ICD-10-CM | POA: Diagnosis not present

## 2019-02-14 DIAGNOSIS — B351 Tinea unguium: Secondary | ICD-10-CM | POA: Insufficient documentation

## 2019-02-14 DIAGNOSIS — M79675 Pain in left toe(s): Secondary | ICD-10-CM

## 2019-02-14 NOTE — Progress Notes (Signed)
Complaint:  Visit Type: Patient presents  to my office for  preventative foot care services. Complaint: Patient states" my nails have grown long and thick and become painful to walk and wear shoes"  The patient presents for preventative foot care services. No changes to ROS.  Patient is taking gabapentin.  Podiatric Exam: Vascular: dorsalis pedis  are palpable bilateral.  Posterior tibial pulses are weakly palpable  B/L. Capillary return is immediate. Temperature gradient is WNL. Skin turgor WNL  Sensorium: Normal Semmes Weinstein monofilament test. Normal tactile sensation bilaterally. Nail Exam: Pt has thick disfigured discolored nails with subungual debris noted bilateral entire nail hallux through fifth toenails Ulcer Exam: There is no evidence of ulcer or pre-ulcerative changes or infection. Orthopedic Exam: Muscle tone and strength are WNL. No limitations in general ROM. No crepitus or effusions noted. Foot type and digits show no abnormalities. HAV  B/L Skin: No Porokeratosis. No infection or ulcers  Diagnosis:  Onychomycosis, , Pain in right toe, pain in left toes  Treatment & Plan Procedures and Treatment: Consent by patient was obtained for treatment procedures.   Debridement of mycotic and hypertrophic toenails, 1 through 5 bilateral and clearing of subungual debris. No ulceration, no infection noted.  Return Visit-Office Procedure: Patient instructed to return to the office for a follow up visit 4 months for continued evaluation and treatment.    Gardiner Barefoot DPM

## 2019-06-17 ENCOUNTER — Encounter: Payer: Self-pay | Admitting: Podiatry

## 2019-06-17 ENCOUNTER — Ambulatory Visit (INDEPENDENT_AMBULATORY_CARE_PROVIDER_SITE_OTHER): Payer: Medicare Other | Admitting: Podiatry

## 2019-06-17 ENCOUNTER — Other Ambulatory Visit: Payer: Self-pay

## 2019-06-17 DIAGNOSIS — B351 Tinea unguium: Secondary | ICD-10-CM | POA: Diagnosis not present

## 2019-06-17 DIAGNOSIS — M79674 Pain in right toe(s): Secondary | ICD-10-CM | POA: Diagnosis not present

## 2019-06-17 DIAGNOSIS — M79675 Pain in left toe(s): Secondary | ICD-10-CM | POA: Diagnosis not present

## 2019-06-17 NOTE — Progress Notes (Signed)
Complaint:  Visit Type: Patient presents  to my office for  preventative foot care services. Complaint: Patient states" my nails have grown long and thick and become painful to walk and wear shoes"  The patient presents for preventative foot care services. No changes to ROS.  Patient is taking gabapentin.  Podiatric Exam: Vascular: dorsalis pedis  are palpable bilateral.  Posterior tibial pulses are weakly palpable  B/L. Capillary return is immediate. Temperature gradient is WNL. Skin turgor WNL  Sensorium: Normal Semmes Weinstein monofilament test. Normal tactile sensation bilaterally. Nail Exam: Pt has thick disfigured discolored nails with subungual debris noted bilateral entire nail hallux through fifth toenails Ulcer Exam: There is no evidence of ulcer or pre-ulcerative changes or infection. Orthopedic Exam: Muscle tone and strength are WNL. No limitations in general ROM. No crepitus or effusions noted. Foot type and digits show no abnormalities. HAV  B/L Skin: No Porokeratosis. No infection or ulcers  Diagnosis:  Onychomycosis, , Pain in right toe, pain in left toes  Treatment & Plan Procedures and Treatment: Consent by patient was obtained for treatment procedures.   Debridement of mycotic and hypertrophic toenails, 1 through 5 bilateral and clearing of subungual debris. No ulceration, no infection noted.  Return Visit-Office Procedure: Patient instructed to return to the office for a follow up visit 4 months for continued evaluation and treatment.    Gardiner Barefoot DPM

## 2019-10-14 ENCOUNTER — Ambulatory Visit: Payer: Medicare Other | Admitting: Podiatry

## 2019-12-16 ENCOUNTER — Ambulatory Visit: Payer: Medicare Other | Admitting: Podiatry

## 2019-12-16 ENCOUNTER — Other Ambulatory Visit: Payer: Self-pay

## 2019-12-16 ENCOUNTER — Encounter: Payer: Self-pay | Admitting: Podiatry

## 2019-12-16 ENCOUNTER — Ambulatory Visit (INDEPENDENT_AMBULATORY_CARE_PROVIDER_SITE_OTHER): Payer: Medicare Other | Admitting: Podiatry

## 2019-12-16 VITALS — Temp 97.7°F

## 2019-12-16 DIAGNOSIS — M79674 Pain in right toe(s): Secondary | ICD-10-CM

## 2019-12-16 DIAGNOSIS — B351 Tinea unguium: Secondary | ICD-10-CM | POA: Diagnosis not present

## 2019-12-16 DIAGNOSIS — M79675 Pain in left toe(s): Secondary | ICD-10-CM

## 2019-12-16 DIAGNOSIS — D689 Coagulation defect, unspecified: Secondary | ICD-10-CM

## 2019-12-16 NOTE — Progress Notes (Addendum)
This patient returns to my office for at risk foot care.  This patient requires this care by a professional since this patient will be at risk due to having coagulation defect due to plavix.  This patient is unable to cut nails himself since the patient cannot reach his nails.These nails are painful walking and wearing shoes. Patient has burning left heel.  This patient presents for at risk foot care today.  General Appearance  Alert, conversant and in no acute stress.  Vascular  Dorsalis pedis and posterior tibial  pulses are palpable  bilaterally.  Capillary return is within normal limits  bilaterally. Temperature is within normal limits  bilaterally.  Neurologic  Senn-Weinstein monofilament wire test within normal limits  bilaterally. Muscle power within normal limits bilaterally.  Nails Thick disfigured discolored nails with subungual debris  from hallux to fifth toes bilaterally. No evidence of bacterial infection or drainage bilaterally.  Orthopedic  No limitations of motion  feet .  No crepitus or effusions noted.  No bony pathology or digital deformities noted.  Skin  normotropic skin with no porokeratosis noted bilaterally.  No signs of infections or ulcers noted.   Peeling skin left heel.  Onychomycosis  Pain in right toes  Pain in left toes  Consent was obtained for treatment procedures.   Mechanical debridement of nails 1-5  bilaterally performed with a nail nipper.  Filed with dremel without incident.  Patient was told to apply vaseline to left heel.   Return office visit    4 months                 Told patient to return for periodic foot care and evaluation due to potential at risk complications.   Gardiner Barefoot DPM

## 2020-04-12 ENCOUNTER — Other Ambulatory Visit: Payer: Self-pay

## 2020-04-12 ENCOUNTER — Emergency Department: Payer: Medicare Other

## 2020-04-12 DIAGNOSIS — J441 Chronic obstructive pulmonary disease with (acute) exacerbation: Secondary | ICD-10-CM | POA: Diagnosis not present

## 2020-04-12 DIAGNOSIS — E876 Hypokalemia: Secondary | ICD-10-CM | POA: Diagnosis present

## 2020-04-12 DIAGNOSIS — F259 Schizoaffective disorder, unspecified: Secondary | ICD-10-CM | POA: Diagnosis present

## 2020-04-12 DIAGNOSIS — Z79899 Other long term (current) drug therapy: Secondary | ICD-10-CM

## 2020-04-12 DIAGNOSIS — J9601 Acute respiratory failure with hypoxia: Secondary | ICD-10-CM | POA: Diagnosis present

## 2020-04-12 DIAGNOSIS — Z8673 Personal history of transient ischemic attack (TIA), and cerebral infarction without residual deficits: Secondary | ICD-10-CM

## 2020-04-12 DIAGNOSIS — G629 Polyneuropathy, unspecified: Secondary | ICD-10-CM | POA: Diagnosis present

## 2020-04-12 DIAGNOSIS — Z7902 Long term (current) use of antithrombotics/antiplatelets: Secondary | ICD-10-CM

## 2020-04-12 DIAGNOSIS — Z825 Family history of asthma and other chronic lower respiratory diseases: Secondary | ICD-10-CM

## 2020-04-12 DIAGNOSIS — T380X5A Adverse effect of glucocorticoids and synthetic analogues, initial encounter: Secondary | ICD-10-CM | POA: Diagnosis not present

## 2020-04-12 DIAGNOSIS — F17210 Nicotine dependence, cigarettes, uncomplicated: Secondary | ICD-10-CM | POA: Diagnosis present

## 2020-04-12 DIAGNOSIS — Z20822 Contact with and (suspected) exposure to covid-19: Secondary | ICD-10-CM | POA: Diagnosis present

## 2020-04-12 DIAGNOSIS — I10 Essential (primary) hypertension: Secondary | ICD-10-CM | POA: Diagnosis present

## 2020-04-12 DIAGNOSIS — Z7982 Long term (current) use of aspirin: Secondary | ICD-10-CM

## 2020-04-12 DIAGNOSIS — D72829 Elevated white blood cell count, unspecified: Secondary | ICD-10-CM | POA: Diagnosis not present

## 2020-04-12 DIAGNOSIS — Z8249 Family history of ischemic heart disease and other diseases of the circulatory system: Secondary | ICD-10-CM

## 2020-04-12 DIAGNOSIS — E785 Hyperlipidemia, unspecified: Secondary | ICD-10-CM | POA: Diagnosis present

## 2020-04-12 LAB — BASIC METABOLIC PANEL
Anion gap: 11 (ref 5–15)
BUN: 10 mg/dL (ref 8–23)
CO2: 28 mmol/L (ref 22–32)
Calcium: 8.7 mg/dL — ABNORMAL LOW (ref 8.9–10.3)
Chloride: 98 mmol/L (ref 98–111)
Creatinine, Ser: 0.63 mg/dL (ref 0.61–1.24)
GFR calc Af Amer: >60 mL/min (ref >60)
GFR calc non Af Amer: >60 mL/min (ref >60)
Glucose, Bld: 105 mg/dL — ABNORMAL HIGH (ref 70–99)
Potassium: 2.9 mmol/L — ABNORMAL LOW (ref 3.5–5.1)
Sodium: 137 mmol/L (ref 135–145)

## 2020-04-12 LAB — CBC WITH DIFFERENTIAL/PLATELET
Abs Immature Granulocytes: 0.03 10*3/uL (ref 0.00–0.07)
Basophils Absolute: 0 10*3/uL (ref 0.0–0.1)
Basophils Relative: 0 %
Eosinophils Absolute: 0 10*3/uL (ref 0.0–0.5)
Eosinophils Relative: 1 %
HCT: 38.6 % — ABNORMAL LOW (ref 39.0–52.0)
Hemoglobin: 13.5 g/dL (ref 13.0–17.0)
Immature Granulocytes: 0 %
Lymphocytes Relative: 30 %
Lymphs Abs: 2.6 10*3/uL (ref 0.7–4.0)
MCH: 31 pg (ref 26.0–34.0)
MCHC: 35 g/dL (ref 30.0–36.0)
MCV: 88.7 fL (ref 80.0–100.0)
Monocytes Absolute: 0.6 10*3/uL (ref 0.1–1.0)
Monocytes Relative: 6 %
Neutro Abs: 5.4 10*3/uL (ref 1.7–7.7)
Neutrophils Relative %: 63 %
Platelets: 141 10*3/uL — ABNORMAL LOW (ref 150–400)
RBC: 4.35 MIL/uL (ref 4.22–5.81)
RDW: 13.1 % (ref 11.5–15.5)
WBC: 8.6 10*3/uL (ref 4.0–10.5)
nRBC: 0 % (ref 0.0–0.2)

## 2020-04-12 NOTE — ED Triage Notes (Signed)
Pt comes into the ED via EMS from [facility] group home with c/o cough with congestion, fever and chills since last Monday,. Pt has audible wheezing in triage,

## 2020-04-13 ENCOUNTER — Other Ambulatory Visit: Payer: Self-pay

## 2020-04-13 ENCOUNTER — Inpatient Hospital Stay
Admission: EM | Admit: 2020-04-13 | Discharge: 2020-04-20 | DRG: 190 | Disposition: A | Discharge status: Home / Self Care | Payer: Medicare Other | Attending: Internal Medicine | Admitting: Internal Medicine

## 2020-04-13 ENCOUNTER — Encounter: Payer: Self-pay | Admitting: Family Medicine

## 2020-04-13 DIAGNOSIS — E876 Hypokalemia: Secondary | ICD-10-CM

## 2020-04-13 DIAGNOSIS — G629 Polyneuropathy, unspecified: Secondary | ICD-10-CM | POA: Diagnosis present

## 2020-04-13 DIAGNOSIS — I10 Essential (primary) hypertension: Secondary | ICD-10-CM | POA: Diagnosis present

## 2020-04-13 DIAGNOSIS — T380X5A Adverse effect of glucocorticoids and synthetic analogues, initial encounter: Secondary | ICD-10-CM | POA: Diagnosis not present

## 2020-04-13 DIAGNOSIS — Z8249 Family history of ischemic heart disease and other diseases of the circulatory system: Secondary | ICD-10-CM | POA: Diagnosis not present

## 2020-04-13 DIAGNOSIS — Z20822 Contact with and (suspected) exposure to covid-19: Secondary | ICD-10-CM | POA: Diagnosis present

## 2020-04-13 DIAGNOSIS — J441 Chronic obstructive pulmonary disease with (acute) exacerbation: Secondary | ICD-10-CM | POA: Diagnosis present

## 2020-04-13 DIAGNOSIS — F259 Schizoaffective disorder, unspecified: Secondary | ICD-10-CM | POA: Diagnosis present

## 2020-04-13 DIAGNOSIS — E785 Hyperlipidemia, unspecified: Secondary | ICD-10-CM | POA: Diagnosis present

## 2020-04-13 DIAGNOSIS — Z7982 Long term (current) use of aspirin: Secondary | ICD-10-CM | POA: Diagnosis not present

## 2020-04-13 DIAGNOSIS — R0682 Tachypnea, not elsewhere classified: Secondary | ICD-10-CM

## 2020-04-13 DIAGNOSIS — Z825 Family history of asthma and other chronic lower respiratory diseases: Secondary | ICD-10-CM | POA: Diagnosis not present

## 2020-04-13 DIAGNOSIS — Z79899 Other long term (current) drug therapy: Secondary | ICD-10-CM | POA: Diagnosis not present

## 2020-04-13 DIAGNOSIS — Z8673 Personal history of transient ischemic attack (TIA), and cerebral infarction without residual deficits: Secondary | ICD-10-CM | POA: Diagnosis not present

## 2020-04-13 DIAGNOSIS — D72829 Elevated white blood cell count, unspecified: Secondary | ICD-10-CM | POA: Diagnosis not present

## 2020-04-13 DIAGNOSIS — J9601 Acute respiratory failure with hypoxia: Secondary | ICD-10-CM | POA: Diagnosis present

## 2020-04-13 DIAGNOSIS — Z7902 Long term (current) use of antithrombotics/antiplatelets: Secondary | ICD-10-CM | POA: Diagnosis not present

## 2020-04-13 DIAGNOSIS — F17210 Nicotine dependence, cigarettes, uncomplicated: Secondary | ICD-10-CM | POA: Diagnosis present

## 2020-04-13 LAB — BASIC METABOLIC PANEL
Anion gap: 11 (ref 5–15)
BUN: 9 mg/dL (ref 8–23)
CO2: 28 mmol/L (ref 22–32)
Calcium: 8.9 mg/dL (ref 8.9–10.3)
Chloride: 99 mmol/L (ref 98–111)
Creatinine, Ser: 0.63 mg/dL (ref 0.61–1.24)
GFR calc Af Amer: >60 mL/min (ref >60)
GFR calc non Af Amer: >60 mL/min (ref >60)
Glucose, Bld: 143 mg/dL — ABNORMAL HIGH (ref 70–99)
Potassium: 3.4 mmol/L — ABNORMAL LOW (ref 3.5–5.1)
Sodium: 138 mmol/L (ref 135–145)

## 2020-04-13 LAB — BLOOD GAS, ARTERIAL
Acid-Base Excess: 4.9 mmol/L — ABNORMAL HIGH (ref 0.0–2.0)
Bicarbonate: 29.2 mmol/L — ABNORMAL HIGH (ref 20.0–28.0)
Delivery systems: POSITIVE
Expiratory PAP: 6
FIO2: 0.32
Inspiratory PAP: 12
O2 Saturation: 96 %
Patient temperature: 37
pCO2 arterial: 41 mmHg (ref 32.0–48.0)
pH, Arterial: 7.46 — ABNORMAL HIGH (ref 7.350–7.450)
pO2, Arterial: 77 mmHg — ABNORMAL LOW (ref 83.0–108.0)

## 2020-04-13 LAB — CBC
HCT: 38.9 % — ABNORMAL LOW (ref 39.0–52.0)
Hemoglobin: 13.3 g/dL (ref 13.0–17.0)
MCH: 30.7 pg (ref 26.0–34.0)
MCHC: 34.2 g/dL (ref 30.0–36.0)
MCV: 89.8 fL (ref 80.0–100.0)
Platelets: 145 10*3/uL — ABNORMAL LOW (ref 150–400)
RBC: 4.33 MIL/uL (ref 4.22–5.81)
RDW: 13 % (ref 11.5–15.5)
WBC: 5.8 10*3/uL (ref 4.0–10.5)
nRBC: 0 % (ref 0.0–0.2)

## 2020-04-13 LAB — RESP PANEL BY RT PCR (RSV, FLU A&B, COVID)
Influenza A by PCR: NEGATIVE
Influenza B by PCR: NEGATIVE
Respiratory Syncytial Virus by PCR: NEGATIVE
SARS Coronavirus 2 by RT PCR: NEGATIVE

## 2020-04-13 LAB — FIBRIN DERIVATIVES D-DIMER (ARMC ONLY): Fibrin derivatives D-dimer (ARMC): 786.73 ng/mL (FEU) — ABNORMAL HIGH (ref 0.00–499.00)

## 2020-04-13 LAB — HIV ANTIBODY (ROUTINE TESTING W REFLEX): HIV Screen 4th Generation wRfx: NONREACTIVE

## 2020-04-13 LAB — TROPONIN I (HIGH SENSITIVITY): Troponin I (High Sensitivity): 9 ng/L (ref <18)

## 2020-04-13 LAB — MAGNESIUM: Magnesium: 2.1 mg/dL (ref 1.7–2.4)

## 2020-04-13 MED ORDER — POTASSIUM CHLORIDE 10 MEQ/100ML IV SOLN
10.0000 meq | Freq: Once | INTRAVENOUS | Status: AC
  Administered 2020-04-13: 10 meq via INTRAVENOUS
  Filled 2020-04-13: qty 100

## 2020-04-13 MED ORDER — QUETIAPINE FUMARATE 25 MG PO TABS
200.0000 mg | ORAL_TABLET | Freq: Every day | ORAL | Status: DC
  Administered 2020-04-13 – 2020-04-19 (×7): 200 mg via ORAL
  Filled 2020-04-13 (×7): qty 8

## 2020-04-13 MED ORDER — ACETAMINOPHEN 650 MG RE SUPP: 650.0000 mg | Freq: Four times a day (QID) | RECTAL | Status: DC | PRN

## 2020-04-13 MED ORDER — NICOTINE 14 MG/24HR TD PT24
14.0000 mg | MEDICATED_PATCH | Freq: Every day | TRANSDERMAL | Status: DC
  Administered 2020-04-14 – 2020-04-19 (×6): 14 mg via TRANSDERMAL
  Filled 2020-04-13 (×9): qty 1

## 2020-04-13 MED ORDER — METHYLPREDNISOLONE SODIUM SUCC 125 MG IJ SOLR
125.0000 mg | Freq: Once | INTRAMUSCULAR | Status: AC
  Administered 2020-04-13: 125 mg via INTRAVENOUS
  Filled 2020-04-13: qty 2

## 2020-04-13 MED ORDER — MELATONIN 5 MG PO TABS
2.5000 mg | ORAL_TABLET | Freq: Every day | ORAL | Status: DC
  Administered 2020-04-14 – 2020-04-19 (×7): 2.5 mg via ORAL
  Filled 2020-04-13 (×3): qty 1
  Filled 2020-04-13 (×2): qty 0.5
  Filled 2020-04-13 (×2): qty 1

## 2020-04-13 MED ORDER — ACETAMINOPHEN 325 MG PO TABS: 650.0000 mg | ORAL_TABLET | Freq: Four times a day (QID) | ORAL | Status: DC | PRN

## 2020-04-13 MED ORDER — SENNOSIDES-DOCUSATE SODIUM 8.6-50 MG PO TABS: 1.0000 | ORAL_TABLET | Freq: Every evening | ORAL | Status: DC | PRN

## 2020-04-13 MED ORDER — MOMETASONE FURO-FORMOTEROL FUM 200-5 MCG/ACT IN AERO
2.0000 | INHALATION_SPRAY | Freq: Two times a day (BID) | RESPIRATORY_TRACT | Status: DC
  Administered 2020-04-13 – 2020-04-20 (×15): 2 via RESPIRATORY_TRACT
  Filled 2020-04-13 (×2): qty 8.8

## 2020-04-13 MED ORDER — POTASSIUM CHLORIDE CRYS ER 20 MEQ PO TBCR
40.0000 meq | EXTENDED_RELEASE_TABLET | Freq: Once | ORAL | Status: AC
  Administered 2020-04-13: 40 meq via ORAL
  Filled 2020-04-13: qty 2

## 2020-04-13 MED ORDER — MIRTAZAPINE 15 MG PO TABS
30.0000 mg | ORAL_TABLET | Freq: Every day | ORAL | Status: DC
  Administered 2020-04-13 – 2020-04-19 (×7): 30 mg via ORAL
  Filled 2020-04-13 (×7): qty 2

## 2020-04-13 MED ORDER — ATENOLOL 50 MG PO TABS
50.0000 mg | ORAL_TABLET | Freq: Every day | ORAL | Status: DC
  Administered 2020-04-13 – 2020-04-20 (×8): 50 mg via ORAL
  Filled 2020-04-13 (×6): qty 1
  Filled 2020-04-13: qty 2
  Filled 2020-04-13: qty 1

## 2020-04-13 MED ORDER — GABAPENTIN 100 MG PO CAPS
100.0000 mg | ORAL_CAPSULE | Freq: Every day | ORAL | Status: DC
  Administered 2020-04-13 – 2020-04-19 (×7): 100 mg via ORAL
  Filled 2020-04-13 (×8): qty 1

## 2020-04-13 MED ORDER — IPRATROPIUM-ALBUTEROL 0.5-2.5 (3) MG/3ML IN SOLN
3.0000 mL | Freq: Once | RESPIRATORY_TRACT | Status: AC
  Administered 2020-04-13: 3 mL via RESPIRATORY_TRACT
  Filled 2020-04-13: qty 3

## 2020-04-13 MED ORDER — AMOXICILLIN-POT CLAVULANATE 875-125 MG PO TABS
1.0000 | ORAL_TABLET | Freq: Once | ORAL | Status: AC
  Administered 2020-04-13: 1 via ORAL
  Filled 2020-04-13: qty 1

## 2020-04-13 MED ORDER — ALBUTEROL SULFATE (2.5 MG/3ML) 0.083% IN NEBU
10.0000 mg | INHALATION_SOLUTION | Freq: Once | RESPIRATORY_TRACT | Status: AC
  Administered 2020-04-13: 10 mg via RESPIRATORY_TRACT
  Filled 2020-04-13: qty 12

## 2020-04-13 MED ORDER — AMANTADINE HCL 100 MG PO CAPS
100.0000 mg | ORAL_CAPSULE | Freq: Two times a day (BID) | ORAL | Status: DC
  Administered 2020-04-13 – 2020-04-20 (×15): 100 mg via ORAL
  Filled 2020-04-13 (×17): qty 1

## 2020-04-13 MED ORDER — IPRATROPIUM-ALBUTEROL 0.5-2.5 (3) MG/3ML IN SOLN
3.0000 mL | Freq: Four times a day (QID) | RESPIRATORY_TRACT | Status: DC
  Administered 2020-04-13 – 2020-04-19 (×20): 3 mL via RESPIRATORY_TRACT
  Filled 2020-04-13 (×20): qty 3

## 2020-04-13 MED ORDER — METHYLPREDNISOLONE SODIUM SUCC 125 MG IJ SOLR
125.0000 mg | Freq: Three times a day (TID) | INTRAMUSCULAR | Status: DC
  Administered 2020-04-13: 125 mg via INTRAVENOUS
  Filled 2020-04-13: qty 2

## 2020-04-13 MED ORDER — IPRATROPIUM-ALBUTEROL 0.5-2.5 (3) MG/3ML IN SOLN
3.0000 mL | Freq: Four times a day (QID) | RESPIRATORY_TRACT | Status: DC | PRN
  Administered 2020-04-13: 3 mL via RESPIRATORY_TRACT
  Filled 2020-04-13: qty 3

## 2020-04-13 MED ORDER — ACETYLCYSTEINE 20 % IN SOLN
2.0000 mL | Freq: Three times a day (TID) | RESPIRATORY_TRACT | Status: DC
  Administered 2020-04-13 – 2020-04-14 (×3): 2 mL via RESPIRATORY_TRACT
  Filled 2020-04-13 (×7): qty 4

## 2020-04-13 MED ORDER — SODIUM CHLORIDE 0.9 % IV SOLN
100.0000 mg | Freq: Two times a day (BID) | INTRAVENOUS | Status: DC
  Administered 2020-04-13 – 2020-04-19 (×13): 100 mg via INTRAVENOUS
  Filled 2020-04-13 (×22): qty 100

## 2020-04-13 MED ORDER — POTASSIUM CHLORIDE 10 MEQ/100ML IV SOLN
INTRAVENOUS | Status: AC
  Filled 2020-04-13: qty 100

## 2020-04-13 MED ORDER — LORAZEPAM 2 MG/ML IJ SOLN
0.5000 mg | Freq: Once | INTRAMUSCULAR | Status: AC
  Administered 2020-04-13: 0.5 mg via INTRAVENOUS
  Filled 2020-04-13: qty 1

## 2020-04-13 MED ORDER — TIOTROPIUM BROMIDE MONOHYDRATE 18 MCG IN CAPS
18.0000 ug | ORAL_CAPSULE | Freq: Every day | RESPIRATORY_TRACT | Status: DC
  Administered 2020-04-13 – 2020-04-20 (×8): 18 ug via RESPIRATORY_TRACT
  Filled 2020-04-13 (×3): qty 5

## 2020-04-13 MED ORDER — ONDANSETRON HCL 4 MG/2ML IJ SOLN: 4.0000 mg | Freq: Four times a day (QID) | INTRAMUSCULAR | Status: DC | PRN

## 2020-04-13 MED ORDER — ENOXAPARIN SODIUM 40 MG/0.4ML ~~LOC~~ SOLN
40.0000 mg | SUBCUTANEOUS | Status: DC
  Administered 2020-04-13 – 2020-04-20 (×8): 40 mg via SUBCUTANEOUS
  Filled 2020-04-13 (×8): qty 0.4

## 2020-04-13 MED ORDER — DILTIAZEM HCL ER COATED BEADS 180 MG PO CP24
180.0000 mg | ORAL_CAPSULE | Freq: Every day | ORAL | Status: DC
  Administered 2020-04-13 – 2020-04-20 (×8): 180 mg via ORAL
  Filled 2020-04-13 (×8): qty 1

## 2020-04-13 MED ORDER — ONDANSETRON HCL 4 MG/2ML IJ SOLN
INTRAMUSCULAR | Status: AC
  Administered 2020-04-13: 4 mg via INTRAVENOUS
  Filled 2020-04-13: qty 2

## 2020-04-13 MED ORDER — HYDROCHLOROTHIAZIDE 25 MG PO TABS
12.5000 mg | ORAL_TABLET | Freq: Every day | ORAL | Status: DC
  Administered 2020-04-13 – 2020-04-20 (×8): 12.5 mg via ORAL
  Filled 2020-04-13 (×8): qty 1

## 2020-04-13 MED ORDER — CLOPIDOGREL BISULFATE 75 MG PO TABS
75.0000 mg | ORAL_TABLET | Freq: Every day | ORAL | Status: DC
  Administered 2020-04-13 – 2020-04-20 (×8): 75 mg via ORAL
  Filled 2020-04-13 (×8): qty 1

## 2020-04-13 MED ORDER — LACTATED RINGERS IV SOLN: INTRAVENOUS | Status: DC

## 2020-04-13 MED ORDER — ASPIRIN EC 81 MG PO TBEC
81.0000 mg | DELAYED_RELEASE_TABLET | Freq: Every day | ORAL | Status: DC
  Administered 2020-04-13 – 2020-04-20 (×8): 81 mg via ORAL
  Filled 2020-04-13 (×8): qty 1

## 2020-04-13 MED ORDER — ALBUTEROL SULFATE (2.5 MG/3ML) 0.083% IN NEBU
2.5000 mg | INHALATION_SOLUTION | RESPIRATORY_TRACT | Status: DC | PRN
  Administered 2020-04-16: 2.5 mg via RESPIRATORY_TRACT
  Filled 2020-04-13: qty 3

## 2020-04-13 MED ORDER — DEXTROMETHORPHAN-GUAIFENESIN 10-100 MG/5ML PO LIQD
10.0000 mL | Freq: Four times a day (QID) | ORAL | Status: DC | PRN
  Filled 2020-04-13: qty 10

## 2020-04-13 MED ORDER — POTASSIUM CHLORIDE 10 MEQ/100ML IV SOLN
10.0000 meq | INTRAVENOUS | Status: AC
  Administered 2020-04-13 (×3): 10 meq via INTRAVENOUS
  Filled 2020-04-13 (×2): qty 100

## 2020-04-13 MED ORDER — ONDANSETRON HCL 4 MG PO TABS: 4.0000 mg | ORAL_TABLET | Freq: Three times a day (TID) | ORAL | Status: DC | PRN

## 2020-04-13 MED ORDER — PRAVASTATIN SODIUM 20 MG PO TABS
20.0000 mg | ORAL_TABLET | Freq: Every day | ORAL | Status: DC
  Administered 2020-04-13 – 2020-04-19 (×7): 20 mg via ORAL
  Filled 2020-04-13 (×7): qty 1

## 2020-04-13 MED ORDER — METHYLPREDNISOLONE SODIUM SUCC 125 MG IJ SOLR
60.0000 mg | Freq: Three times a day (TID) | INTRAMUSCULAR | Status: DC
  Administered 2020-04-13 – 2020-04-17 (×14): 60 mg via INTRAVENOUS
  Filled 2020-04-13 (×14): qty 2

## 2020-04-13 NOTE — H&P (Signed)
History and Physical    [name] WSF:[id number] DOB: Nov 10, 1960 DOA: 04/13/2020  PCP: Donnie Coffin, MD   Patient coming from:  Group home  Chief Complaint: Shortness of breath and cough has been worsening over the last week  HPI: [name] is a 59 y.o. male with medical history significant for COPD, HTN, smoking, hiatal hernia, schizophrenia, pneumonia who presents from his group home with cough.  Patient reports one week history of SOB, cough with productive green sputum, congestion, subjective fevers, and chills.  Is complaining of a lot of congestion in his chest but denies chest pain. He reports he has had wheezing over the past week and has been using his breathing treatments. He has used tylenol intermittently at the group home.  No vomiting or diarrhea. He denies any syncope or urinary frequency or dysuria. Denies abdominal pain.  No personal or family history of blood clots, no recent travel immobilization, no leg pain or swelling, no hemoptysis or exogenous hormones.  Patient has been vaccinated for Covid.  Denies any known sick contact exposures.  ED Course: In the emergency room patient has had tachypnea and has been short of breath.  He was given breathing treatments including a 1 hour continuous albuterol treatment but he continued to have shortness of breath and tachypnea and was placed on BiPAP.  Chest x-ray is negative for infiltrate or consolidation.  White count is normal.  Hospitalist was asked to evaluate and admit for further management of his COPD exacerbation  Review of Systems:  General: Denies weakness, fever, chills, weight loss, night sweats.  Denies dizziness.  Denies change in appetite HENT: Denies head trauma, headache, denies change in hearing, tinnitus.  Denies nasal congestion or bleeding.  Denies sore throat, sores in mouth.  Denies difficulty swallowing Eyes: Denies blurry vision, pain in eye, drainage.  Denies discoloration of eyes. Neck: Denies pain.   Denies swelling.  Denies pain with movement. Cardiovascular: Denies chest pain, palpitations.  Denies edema.  Denies orthopnea Respiratory: Reports shortness of breath with chronic dry cough.  Reports wheezing.  Denies sputum production Gastrointestinal: Denies abdominal pain, swelling.  Denies nausea, vomiting, diarrhea.  Denies melena.  Denies hematemesis. Musculoskeletal: Denies limitation of movement.  Denies deformity or swelling.  Denies pain.  Denies arthralgias or myalgias. Genitourinary: Denies pelvic pain.  Denies urinary frequency or hesitancy.  Denies dysuria.  Skin: Denies rash.  Denies petechiae, purpura, ecchymosis. Neurological: Denies headache.  Denies syncope.  Denies seizure activity.  Denies weakness or paresthesia.  Denies slurred speech, drooping face.  Denies visual change. Psychiatric: Denies depression, anxiety.  Denies suicidal thoughts or ideation.  Denies hallucinations.  Past Medical History:  Diagnosis Date  . COPD (chronic obstructive pulmonary disease) ([place])   . Depression   . History of hiatal hernia   . Hypertension   . Pneumonia   . Schizophrenia ([place])   . Shortness of breath dyspnea   . Stroke [facility])     Past Surgical History:  Procedure Laterality Date  . HERNIA REPAIR    . INSERTION OF MESH N/A 12/18/2014   Procedure: INSERTION OF MESH;  Surgeon: Florene Glen, MD;  Location: [facility] ORS;  Service: General;  Laterality: N/A;  . SUPRA-UMBILICAL HERNIA  0/17/4944   Procedure: SUPRA-UMBILICAL HERNIA;  Surgeon: Florene Glen, MD;  Location: [facility] ORS;  Service: General;;  . UMBILICAL HERNIA REPAIR N/A 12/18/2014   Procedure: HERNIA REPAIR UMBILICAL ADULT;  Surgeon: Florene Glen, MD;  Location: [facility] ORS;  Service: General;  Laterality: N/A;    Social History  reports that he has been smoking cigarettes. He has a 45.00 pack-year smoking history. He has never used smokeless tobacco. He reports previous alcohol use of about 75.0 standard drinks of  alcohol per week. He reports previous drug use.  No Known Allergies  Family History  Problem Relation Age of Onset  . Asthma Mother   . Hypertension Father      Prior to Admission medications   Medication Sig Start Date End Date Taking? Authorizing Provider  acetaminophen (TYLENOL) 325 MG tablet Take 650 mg by mouth 2 (two) times daily as needed for mild pain.   Yes [provider]  amantadine (SYMMETREL) 100 MG capsule Take 100 mg by mouth 2 (two) times daily.   Yes [provider]  aspirin EC 81 MG tablet Take by mouth.   Yes [provider]  atenolol (TENORMIN) 50 MG tablet Take 50 mg by mouth daily.  09/24/18  Yes [provider]  budesonide-formoterol (SYMBICORT) 160-4.5 MCG/ACT inhaler Inhale 2 puffs into the lungs 2 (two) times daily.   Yes [provider]  clopidogrel (PLAVIX) 75 MG tablet  09/27/18  Yes [provider]  dextromethorphan-guaiFENesin (TUSSIN DM) 10-100 MG/5ML liquid Take 10 mLs by mouth every 6 (six) hours as needed for cough.   Yes [provider]  diltiazem (CARDIZEM CD) 180 MG 24 hr capsule Take 180 mg by mouth daily.  10/02/18  Yes [provider]  docusate sodium (COLACE) 100 MG capsule Take 100 mg by mouth 2 (two) times daily as needed for mild constipation.   Yes [provider]  gabapentin (NEURONTIN) 100 MG capsule Take 100 mg by mouth at bedtime.  02/11/19  Yes [provider]  guaiFENesin (MUCINEX) 600 MG 12 hr tablet Take 600 mg by mouth 2 (two) times daily.   Yes [provider]  hydrochlorothiazide (HYDRODIURIL) 12.5 MG tablet  02/11/19  Yes [provider]  INVEGA TRINZA 819 MG/2.625ML SUSY Inject 819 mg into the muscle every 3 (three) months.  10/02/18  Yes [provider]  ipratropium-albuterol (DUONEB) 0.5-2.5 (3) MG/3ML SOLN Take 3 mLs by nebulization every 6 (six) hours as needed (every 4 to 6 hours as needed for shortnes of breath or  wheeziing).    Yes [provider]  lovastatin (MEVACOR) 20 MG tablet Take 20 mg by mouth at bedtime.   Yes [provider]  Melatonin 5 MG TABS Take 1 tablet by mouth at bedtime as needed.   Yes [provider]  mirtazapine (REMERON) 30 MG tablet Take 30 mg by mouth at bedtime.   Yes [provider]  Multiple Vitamin (THEREMS PO) Take by mouth daily.   Yes [provider]  Omega-3 Fatty Acids (FISH OIL) 1000 MG CPDR Take by mouth.   Yes [provider]  potassium chloride SA (K-DUR) 20 MEQ tablet  02/11/19  Yes [provider]  Pyridoxine HCl (B-6) 100 MG TABS Take by mouth daily.   Yes [provider]  tiotropium (SPIRIVA) 18 MCG inhalation capsule Place 1 capsule (18 mcg total) into inhaler and inhale daily. 01/22/17  Yes Mody, Ulice Bold, MD  VITAMIN D, ERGOCALCIFEROL, PO Take 1,000 Units by mouth daily.    Yes [provider]  QUEtiapine (SEROQUEL) 200 MG tablet Take 200 mg by mouth at bedtime.    [provider]    Physical Exam: Vitals:   04/13/20 0330 04/13/20 0400 04/13/20 0440  04/13/20 0450  BP: (!) 114/95 132/87  (!) 138/95  Pulse: (!) 102 97  (!) 110  Resp: 20 (!) 25 (!) 26 (!) 23  Temp:      TempSrc:      SpO2: 91% 93% 91% 90%  Weight:      Height:        Constitutional: NAD, calm, comfortable Vitals:   04/13/20 0330 04/13/20 0400 04/13/20 0440 04/13/20 0450  BP: (!) 114/95 132/87  (!) 138/95  Pulse: (!) 102 97  (!) 110  Resp: 20 (!) 25 (!) 26 (!) 23  Temp:      TempSrc:      SpO2: 91% 93% 91% 90%  Weight:      Height:       General: WDWN, Alert and oriented x3.  Eyes: EOMI, PERRL, lids and conjunctivae normal.  Sclera nonicteric HENT:  [place]/AT, external ears normal.  Nares patent without epistasis.  Mucous membranes are moist. Posterior pharynx clear of any exudate or lesions. Neck: Soft, normal range of motion, supple, no masses, no thyromegaly.  Trachea midline Respiratory:  Diminished breath sounds bilaterally.  Diffuse scattered rales and expiratory wheezing wheezing, no crackles. Normal respiratory effort. No accessory muscle use.  Cardiovascular: Regular rate and rhythm, no murmurs / rubs / gallops. No extremity edema. 1+ pedal pulses.  Abdomen: Soft, no tenderness, nondistended, no rebound or guarding.  No masses palpated.. Bowel sounds normoactive Musculoskeletal: FROM.  Has clubbing of digits.  No cyanosis. No joint deformity upper and lower extremities. Normal muscle tone.  Skin: Warm, dry, intact no rashes, lesions, ulcers. No induration Neurologic: CN 2-12 grossly intact.  Normal speech.  Sensation intact, Strength 5/5 in all extremities.   Psychiatric: Normal judgment and insight.  Normal mood.    Labs on Admission: I have personally reviewed following labs and imaging studies  CBC: Recent Labs  Lab 04/12/20 1815  WBC 8.6  NEUTROABS 5.4  HGB 13.5  HCT 38.6*  MCV 88.7  PLT 141*    Basic Metabolic Panel: Recent Labs  Lab 04/12/20 1815 04/13/20 0147  NA 137  --   K 2.9*  --   CL 98  --   CO2 28  --   GLUCOSE 105*  --   BUN 10  --   CREATININE 0.63  --   CALCIUM 8.7*  --   MG  --  2.1    GFR: Estimated Creatinine Clearance: 118.8 mL/min (by C-G formula based on SCr of 0.63 mg/dL).  Liver Function Tests: No results for input(s): AST, ALT, ALKPHOS, BILITOT, PROT, ALBUMIN in the last 168 hours.  Urine analysis:    Component Value Date/Time   COLORURINE Colorless 12/27/2013 1921   APPEARANCEUR Clear 12/27/2013 1921   LABSPEC 1.002 12/27/2013 1921   PHURINE 6.0 12/27/2013 1921   GLUCOSEU Negative 12/27/2013 1921   HGBUR Negative 12/27/2013 1921   BILIRUBINUR Negative 12/27/2013 1921   KETONESUR Negative 12/27/2013 1921   PROTEINUR Negative 12/27/2013 1921   NITRITE Negative 12/27/2013 1921   LEUKOCYTESUR Negative 12/27/2013 1921    Radiological Exams on Admission: DG Chest 2 View  Result Date: 04/12/2020 CLINICAL DATA:   Shortness of breath.  Cough and fever.  Wheezing. EXAM: CHEST - 2 VIEW COMPARISON:  Radiograph 01/20/2017, CT 11/02/2011 FINDINGS: Upper normal heart size with normal mediastinal contours. No pulmonary edema. There is mild peribronchial thickening with borderline hyperinflation. Streaky bibasilar opacities appear similar to prior exam and likely represent scarring. There is no evidence of acute  or confluent airspace disease. No pleural effusion or pneumothorax. No acute osseous abnormalities are seen. IMPRESSION: Mild peribronchial thickening and borderline hyperinflation, may be related COPD, bronchitis or asthma. Streaky bibasilar opacities appear similar to prior exam and likely represent scarring. Electronically Signed   By: Keith Rake M.D.   On: 04/12/2020 18:36    EKG: Independently reviewed.  EKG is reviewed.  Normal sinus rhythm.  Nonspecific ST changes but no acute ST elevation or depression.  QTc is 469  Assessment/Plan Principal Problem:   COPD exacerbation [facility]) Mr. Opara is admitted to stepdown unit as he is requiring BiPAP for his COPD exacerbation.  He did receive a 1 hour albuterol nebulizer treatment emergency room with continued shortness of breath and hypoxia despite this treatment and was then converted to BiPAP. Placed on Solu-Medrol 100 mg every 8 hours x3 doses. Started on doxycycline mgs twice daily for COPD exacerbation requiring hospitalization DuoNebs every 4 hours as needed.  Continue Spiriva.  Is on Symbicort at home which will be converted to [facility] twice a day per formulary alternative ABG has been ordered and is currently pending We will check D-dimer level with patient continuing to have difficulty maintaining O2 saturations and now requiring BiPAP.  If D-dimer is elevated will obtain CT angiography to rule out PE  Active Problems:   Hypokalemia Patient with a potassium of 2.7.  Was given p.o. potassium in the emergency room and 10 mEq of IV potassium.  Will  give another 30 mg of IV potassium.  Magnesium level was checked and was normal.    Essential hypertension Continue home antihypertensive medication regimen with atenolol, Cardizem, HCTZ.  Monitor blood pressure     Tobacco use Patient placed on nicotine patch significant withdrawal while in the hospital.  Patient is strongly urged to discontinue tobacco use and health hazards of continued smoking are discussed with patient    DVT prophylaxis: Padua score elevated.  Lovenox for DVT prophylaxis.  If D-dimer is elevated will convert to therapeutic anticoagulation until CT angiography can be obtained to rule out PE Code Status:   Full code Family Communication:  Diagnosis plan discussed with patient.  Patient verbalized understanding and agrees with plan.  Questions were answered.  Further recommended to follow as clinical indicated Disposition Plan:   Patient is from:  Home  Anticipated DC to:  Home  Anticipated DC date:  Anticipate greater than 2 midnights in hospital for acute medical condition treatment  Anticipated DC barriers: No barriers to discharge identified at this time  Admission status:  Inpatient  Severity of Illness: The appropriate patient status for this patient is INPATIENT. Inpatient status is judged to be reasonable and necessary in order to provide the required intensity of service to ensure the patient's safety. The patient's presenting symptoms, physical exam findings, and initial radiographic and laboratory data in the context of their chronic comorbidities is felt to place them at high risk for further clinical deterioration. Furthermore, it is not anticipated that the patient will be medically stable for discharge from the hospital within 2 midnights of admission. The following factors support the patient status of inpatient.    * I certify that at the point of admission it is my clinical judgment that the patient will require inpatient hospital care spanning beyond 2  midnights from the point of admission due to high intensity of service, high risk for further deterioration and high frequency of surveillance required.Yevonne Aline Kylar Leonhardt MD [facility]  How to contact the [facility] Attending or Consulting provider [place] or covering provider during after hours [place], for this patient?   1. Check the care team in [facility] and look for a) attending/consulting [facility] provider listed and b) the [facility] team listed 2. Log into [email or web address] and use [place]'s universal password to access. If you do not have the password, please contact the hospital operator. 3. Locate the [facility] provider you are looking for under [facility] and [place] to a number that you can be directly reached. 4. If you still have difficulty reaching the provider, please [place] the [facility] (Director on Call) for the Hospitalists listed on amion for assistance.  04/13/2020, 5:12 AM

## 2020-04-13 NOTE — ED Notes (Signed)
Admitting MD notified.

## 2020-04-13 NOTE — ED Notes (Signed)
RT called to placed pt on bipap  Pt moved to 6 lpm [place]

## 2020-04-13 NOTE — ED Notes (Signed)
Jann, ed tech over to room 6 to transport pt to room 37

## 2020-04-13 NOTE — Progress Notes (Signed)
PROGRESS NOTE    [name]  ERX:[id number] DOB: 1960-10-19 DOA: 04/13/2020 PCP: Donnie Coffin, MD      Brief Narrative:  [name] is a 59 y.o. 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  In the ER, wheezing, CXR hyperinflated with cuffing.  Was severely tachypneic and out of breath, requiring BiPAP for respiratory support.        Assessment & Plan:  COPD exacerbation This is a severe exacerbation of his chronic disease.  -Continue steroids -Continue bronchodilators -Continue antibiotics  -Continue ICS/LABA/LAMA    Smoking Smoking cessation recommended -Continue nicotine patch  Hypertension Cerebrovascular disease secondary prevention -Continue HCTZ -Continue aspirin, atenolol, Plavix, diltiazem, pravastatin  Hypokalemia Mag normal.  K supplemented. -Trend K  Schizoaffective disorder On [place], no active psychosis. -Continue Seroquel, amantadine, mirtazapine  Polyneuropathy -Continue gabapentin         Disposition: Status is: Inpatient  Remains inpatient appropriate because:IV treatments appropriate due to intensity of illness or inability to take PO and Inpatient level of care appropriate due to severity of illness   Dispo: The patient is from: Home              Anticipated d/c is to: Home              Anticipated d/c date is: 2 days              Patient currently is not medically stable to d/c.              MDM: This is a no charge note.  For further details, please see H&P by my partner Dr. Tonie Griffith from earlier today.  The below labs and imaging reports were reviewed and summarized above.    DVT prophylaxis: enoxaparin (LOVENOX) injection 40 mg Start: 04/13/20 1000  Code Status: FULL Family Communication:     Consultants:     Procedures:     Antimicrobials:   Doxycycline 9/6 >>   Culture data:   COVID  negative 9/6           Subjective: Feeling still dyspneic and wheezing.        Objective: Vitals:   04/13/20 1800 04/13/20 1830 04/13/20 1900 04/13/20 2012  BP: 132/90 139/89 (!) 138/104 (!) 134/94  Pulse: 92 90 86 95  Resp: (!) 22 (!) 24 (!) 26 (!) 24  Temp:    98.6 F (37 C)  TempSrc:    Oral  SpO2: 94% (!) 89% 92% 95%  Weight:      Height:        Intake/Output Summary (Last 24 hours) at 04/13/2020 2026 Last data filed at 04/13/2020 1706 Gross per 24 hour  Intake 98.16 ml  Output 670 ml  Net -571.84 ml   Filed Weights   04/12/20 1811  Weight: 96.6 kg    Examination: The patient was seen and examined.      Data Reviewed: I have personally reviewed following labs and imaging studies:  CBC: Recent Labs  Lab 04/12/20 1815 04/13/20 0729  WBC 8.6 5.8  NEUTROABS 5.4  --   HGB 13.5 13.3  HCT 38.6* 38.9*  MCV 88.7 89.8  PLT 141* 950*   Basic Metabolic Panel: Recent Labs  Lab 04/12/20 1815 04/13/20 0147 04/13/20 0729  NA 137  --  138  K 2.9*  --  3.4*  CL 98  --  99  CO2 28  --  28  GLUCOSE 105*  --  143*  BUN 10  --  9  CREATININE 0.63  --  0.63  CALCIUM 8.7*  --  8.9  MG  --  2.1  --    GFR: Estimated Creatinine Clearance: 118.8 mL/min (by C-G formula based on SCr of 0.63 mg/dL). Liver Function Tests: No results for input(s): AST, ALT, ALKPHOS, BILITOT, PROT, ALBUMIN in the last 168 hours. No results for input(s): LIPASE, AMYLASE in the last 168 hours. No results for input(s): AMMONIA in the last 168 hours. Coagulation Profile: No results for input(s): INR, PROTIME in the last 168 hours. Cardiac Enzymes: No results for input(s): CKTOTAL, CKMB, CKMBINDEX, TROPONINI in the last 168 hours. BNP (last 3 results) No results for input(s): PROBNP in the last 8760 hours. HbA1C: No results for input(s): HGBA1C in the last 72 hours. CBG: No results for input(s): GLUCAP in the last 168 hours. Lipid Profile: No results for input(s): CHOL, HDL,  LDLCALC, TRIG, CHOLHDL, LDLDIRECT in the last 72 hours. Thyroid Function Tests: No results for input(s): TSH, T4TOTAL, FREET4, T3FREE, THYROIDAB in the last 72 hours. Anemia Panel: No results for input(s): VITAMINB12, FOLATE, FERRITIN, TIBC, IRON, RETICCTPCT in the last 72 hours. Urine analysis:    Component Value Date/Time   COLORURINE Colorless 12/27/2013 1921   APPEARANCEUR Clear 12/27/2013 1921   LABSPEC 1.002 12/27/2013 1921   PHURINE 6.0 12/27/2013 1921   GLUCOSEU Negative 12/27/2013 1921   HGBUR Negative 12/27/2013 1921   BILIRUBINUR Negative 12/27/2013 1921   KETONESUR Negative 12/27/2013 1921   PROTEINUR Negative 12/27/2013 1921   NITRITE Negative 12/27/2013 1921   LEUKOCYTESUR Negative 12/27/2013 1921   Sepsis Labs: @LABRCNTIP (procalcitonin:4,lacticacidven:4)  ) Recent Results (from the past 240 hour(s))  Resp Panel by RT PCR (RSV, Flu A&B, Covid) - Nasopharyngeal Swab     Status: None   Collection Time: 04/13/20  1:47 AM   Specimen: Nasopharyngeal Swab  Result Value Ref Range Status   SARS Coronavirus 2 by RT PCR NEGATIVE NEGATIVE Final    Comment: (NOTE) SARS-CoV-2 target nucleic acids are NOT DETECTED.  The SARS-CoV-2 RNA is generally detectable in upper respiratoy specimens during the acute phase of infection. The lowest concentration of SARS-CoV-2 viral copies this assay can detect is 131 copies/mL. A negative result does not preclude SARS-Cov-2 infection and should not be used as the sole basis for treatment or other patient management decisions. A negative result may occur with  improper specimen collection/handling, submission of specimen other than nasopharyngeal swab, presence of viral mutation(s) within the areas targeted by this assay, and inadequate number of viral copies (<131 copies/mL). A negative result must be combined with clinical observations, patient history, and epidemiological information. The expected result is Negative.  Fact Sheet for  Patients:  PinkCheek.be  Fact Sheet for Healthcare Providers:  GravelBags.it  This test is no t yet approved or cleared by the [place] FDA and  has been authorized for detection and/or diagnosis of SARS-CoV-2 by FDA under an Emergency Use Authorization (EUA). This EUA will remain  in effect (meaning this test can be used) for the duration of the COVID-19 declaration under Section 564(b)(1) of the Act, 21 U.S.C. section 360bbb-3(b)(1), unless the authorization is terminated or revoked sooner.     Influenza A by PCR NEGATIVE NEGATIVE Final   Influenza B by PCR NEGATIVE NEGATIVE Final    Comment: (NOTE) The Xpert Xpress SARS-CoV-2/FLU/RSV assay is intended as an aid in  the diagnosis of influenza from Nasopharyngeal  swab specimens and  should not be used as a sole basis for treatment. Nasal washings and  aspirates are unacceptable for Xpert Xpress SARS-CoV-2/FLU/RSV  testing.  Fact Sheet for Patients: PinkCheek.be  Fact Sheet for Healthcare Providers: GravelBags.it  This test is not yet approved or cleared by the [place] FDA and  has been authorized for detection and/or diagnosis of SARS-CoV-2 by  FDA under an Emergency Use Authorization (EUA). This EUA will remain  in effect (meaning this test can be used) for the duration of the  Covid-19 declaration under Section 564(b)(1) of the Act, 21  U.S.C. section 360bbb-3(b)(1), unless the authorization is  terminated or revoked.    Respiratory Syncytial Virus by PCR NEGATIVE NEGATIVE Final    Comment: (NOTE) Fact Sheet for Patients: PinkCheek.be  Fact Sheet for Healthcare Providers: GravelBags.it  This test is not yet approved or cleared by the [place] FDA and  has been authorized for detection and/or diagnosis of SARS-CoV-2 by  FDA under an  Emergency Use Authorization (EUA). This EUA will remain  in effect (meaning this test can be used) for the duration of the  COVID-19 declaration under Section 564(b)(1) of the Act, 21 U.S.C.  section 360bbb-3(b)(1), unless the authorization is terminated or  revoked. Performed at [facility], [address], [place], [place] [address]          Radiology Studies: DG Chest 2 View  Result Date: 04/12/2020 CLINICAL DATA:  Shortness of breath.  Cough and fever.  Wheezing. EXAM: CHEST - 2 VIEW COMPARISON:  Radiograph 01/20/2017, CT 11/02/2011 FINDINGS: Upper normal heart size with normal mediastinal contours. No pulmonary edema. There is mild peribronchial thickening with borderline hyperinflation. Streaky bibasilar opacities appear similar to prior exam and likely represent scarring. There is no evidence of acute or confluent airspace disease. No pleural effusion or pneumothorax. No acute osseous abnormalities are seen. IMPRESSION: Mild peribronchial thickening and borderline hyperinflation, may be related COPD, bronchitis or asthma. Streaky bibasilar opacities appear similar to prior exam and likely represent scarring. Electronically Signed   By: Keith Rake M.D.   On: 04/12/2020 18:36        Scheduled Meds:  acetylcysteine  2 mL Nebulization TID   amantadine  100 mg Oral BID   aspirin EC  81 mg Oral Daily   atenolol  50 mg Oral Daily   clopidogrel  75 mg Oral Daily   diltiazem  180 mg Oral Daily   enoxaparin (LOVENOX) injection  40 mg Subcutaneous Q24H   gabapentin  100 mg Oral QHS   hydrochlorothiazide  12.5 mg Oral Daily   ipratropium-albuterol  3 mL Nebulization Q6H   methylPREDNISolone (SOLU-MEDROL) injection  60 mg Intravenous TID   mirtazapine  30 mg Oral QHS   mometasone-formoterol  2 puff Inhalation BID   nicotine  14 mg Transdermal Daily   pravastatin  20 mg Oral q1800   QUEtiapine  200 mg Oral QHS   tiotropium  18 mcg Inhalation Daily    Continuous Infusions:  doxycycline (VIBRAMYCIN) IV Stopped (04/13/20 1100)   lactated ringers 100 mL/hr at 04/13/20 1706   potassium chloride       LOS: 0 days    Time spent: 25 minutes    Edwin Dada, MD [facility] 04/13/2020, 8:26 PM     Please [place] though [place] or Epic secure chat:  For password, contact charge nurse

## 2020-04-13 NOTE — ED Notes (Signed)
Pt requesting to come off Bipap. Admitting MD notified at this time.

## 2020-04-13 NOTE — ED Notes (Signed)
Pt with wheezing noted, able to speak in full sentences, pt remains with tachypnea. Ena Dawley, primary rn notified at this time that will hold on transferring pt due to resp status in case pt needs bipap. Primary rn into room at this time to reassess.

## 2020-04-13 NOTE — ED Notes (Signed)
Assumed care of pt, phone provided to update family. Report called to receiving RN. Pt denies needs or concerns at this time. Denies pain, sob, or cp. O2 sat 90% on 3L. AO x4. Talking in full sentences with regular and unlabored breathing, +c=nonproductive cough.

## 2020-04-13 NOTE — ED Provider Notes (Addendum)
[name] - [facility] Emergency Department Provider Note  ____________________________________________  Time seen: Approximately 1:38 AM  I have reviewed the triage vital signs and the nursing notes.   HISTORY  Chief Complaint Shortness of Breath   HPI [name] is a 59 y.o. male with a history of COPD, smoking, hiatal hernia, hypertension, schizophrenia, pneumonia  who presents from his group home with cough.  Patient reports 4 days of cough productive of green sputum, congestion, subjective fevers, and chills.  Is complaining of a lot of congestion in his chest but denies chest pain.  Has had intermittent shortness of breath and wheezing.  No vomiting or diarrhea.  No personal or family history of blood clots, no recent travel immobilization, no leg pain or swelling, no hemoptysis or exogenous hormones.  Patient has been vaccinated for Covid.  Denies any known contact exposures.  Past Medical History:  Diagnosis Date  . COPD (chronic obstructive pulmonary disease) ([place])   . Depression   . History of hiatal hernia   . Hypertension   . Pneumonia   . Schizophrenia ([place])   . Shortness of breath dyspnea   . Stroke [facility])     Patient Active Problem List   Diagnosis Date Noted  . Hypokalemia 04/13/2020  . Essential hypertension 04/13/2020  . Coagulation disorder ([place]) 12/16/2019  . Pain due to onychomycosis of toenails of both feet 02/14/2019  . COPD exacerbation ([place]) 01/16/2017  . Ventral hernia 12/18/2014  . Hematoma of leg 01/30/2014    Past Surgical History:  Procedure Laterality Date  . HERNIA REPAIR    . INSERTION OF MESH N/A 12/18/2014   Procedure: INSERTION OF MESH;  Surgeon: Florene Glen, MD;  Location: [facility] ORS;  Service: General;  Laterality: N/A;  . SUPRA-UMBILICAL HERNIA  9/32/3557   Procedure: SUPRA-UMBILICAL HERNIA;  Surgeon: Florene Glen, MD;  Location: [facility] ORS;  Service: General;;  . UMBILICAL HERNIA REPAIR N/A 12/18/2014    Procedure: HERNIA REPAIR UMBILICAL ADULT;  Surgeon: Florene Glen, MD;  Location: [facility] ORS;  Service: General;  Laterality: N/A;    Prior to Admission medications   Medication Sig Start Date End Date Taking? Authorizing Provider  acetaminophen (TYLENOL) 325 MG tablet Take 650 mg by mouth 2 (two) times daily as needed for mild pain.   Yes [provider]  amantadine (SYMMETREL) 100 MG capsule Take 100 mg by mouth 2 (two) times daily.   Yes [provider]  aspirin EC 81 MG tablet Take by mouth.   Yes [provider]  atenolol (TENORMIN) 50 MG tablet Take 50 mg by mouth daily.  09/24/18  Yes [provider]  budesonide-formoterol (SYMBICORT) 160-4.5 MCG/ACT inhaler Inhale 2 puffs into the lungs 2 (two) times daily.   Yes [provider]  clopidogrel (PLAVIX) 75 MG tablet  09/27/18  Yes [provider]  dextromethorphan-guaiFENesin (TUSSIN DM) 10-100 MG/5ML liquid Take 10 mLs by mouth every 6 (six) hours as needed for cough.   Yes [provider]  diltiazem (CARDIZEM CD) 180 MG 24 hr capsule Take 180 mg by mouth daily.  10/02/18  Yes [provider]  docusate sodium (COLACE) 100 MG capsule Take 100 mg by mouth 2 (two) times daily as needed for mild constipation.   Yes [provider]  gabapentin (NEURONTIN) 100 MG capsule Take 100 mg by mouth at bedtime.  02/11/19  Yes [provider]  guaiFENesin (MUCINEX) 600 MG 12 hr tablet Take 600 mg by mouth  2 (two) times daily.   Yes [provider]  hydrochlorothiazide (HYDRODIURIL) 12.5 MG tablet  02/11/19  Yes [provider]  INVEGA TRINZA 819 MG/2.625ML SUSY Inject 819 mg into the muscle every 3 (three) months.  10/02/18  Yes [provider]  ipratropium-albuterol (DUONEB) 0.5-2.5 (3) MG/3ML SOLN Take 3 mLs by nebulization every 6 (six) hours as needed (every 4 to 6 hours as needed for shortnes of breath or wheeziing).    Yes [provider]  lovastatin (MEVACOR) 20 MG tablet Take 20 mg by mouth at bedtime.   Yes [provider]  Melatonin 5 MG TABS Take 1 tablet by mouth at bedtime as needed.   Yes [provider]  mirtazapine (REMERON) 30 MG tablet Take 30 mg by mouth at bedtime.   Yes [provider]  Multiple Vitamin (THEREMS PO) Take by mouth daily.   Yes [provider]  Omega-3 Fatty Acids (FISH OIL) 1000 MG CPDR Take by mouth.   Yes [provider]  potassium chloride SA (K-DUR) 20 MEQ tablet  02/11/19  Yes [provider]  Pyridoxine HCl (B-6) 100 MG TABS Take by mouth daily.   Yes [provider]  tiotropium (SPIRIVA) 18 MCG inhalation capsule Place 1 capsule (18 mcg total) into inhaler and inhale daily. 01/22/17  Yes Mody, Ulice Bold, MD  VITAMIN D, ERGOCALCIFEROL, PO Take 1,000 Units by mouth daily.    Yes [provider]  QUEtiapine (SEROQUEL) 200 MG tablet Take 200 mg by mouth at bedtime.    [provider]    Allergies Patient has no known allergies.  Family History  Problem Relation Age of Onset  . Asthma Mother   . Hypertension Father     Social History Social History   Tobacco Use  . Smoking status: Current Every Day Smoker    Packs/day: 1.00    Years: 45.00    Pack years: 45.00    Types: Cigarettes  . Smokeless tobacco: Never Used  Substance Use Topics  . Alcohol use: Not on file  . Drug use: Not on file    Review of Systems  Constitutional: + fever, chills Eyes: Negative for visual changes. ENT: Negative for sore throat. + congestion Neck: No neck pain  Cardiovascular: Negative for chest pain. Respiratory: Negative for shortness of breath. + cough Gastrointestinal: Negative for abdominal pain, vomiting or diarrhea. Genitourinary: Negative for dysuria. Musculoskeletal: Negative for back pain. Skin: Negative for rash. Neurological: Negative for headaches, weakness or numbness. Psych: No SI or  HI  ____________________________________________   PHYSICAL EXAM:  VITAL SIGNS: ED Triage Vitals  Enc Vitals Group     BP 04/12/20 1810 122/81     Pulse Rate 04/12/20 1810 82     Resp 04/12/20 1810 (!) 24     Temp 04/12/20 1810 97.8 F (36.6 C)     Temp Source 04/12/20 1810 Oral     SpO2 04/12/20 1810 91 %     Weight 04/12/20 1811 213 lb (96.6 kg)     Height 04/12/20 1811 6\' 1"  (1.854 m)     Head Circumference --      Peak Flow --      Pain Score 04/12/20 1811 0     Pain Loc --      Pain Edu? --      Excl. in [place]? --     Constitutional: Alert and oriented. Well appearing and in no apparent distress. HEENT:      Head:  Normocephalic and atraumatic.         Eyes: Conjunctivae are normal. Sclera is non-icteric.       Mouth/Throat: Mucous membranes are moist.       Neck: Supple with no signs of meningismus. Cardiovascular: Regular rate and rhythm. No murmurs, gallops, or rubs. 2+ symmetrical distal pulses are present in all extremities. No JVD. Respiratory: Slightly increased work of breathing with coarse rhonchi bilaterally, satting on the low 90s on room air, placed on 2 L nasal cannula Gastrointestinal: Soft, non tender. Musculoskeletal: No edema, cyanosis, or erythema of extremities. Neurologic: Normal speech and language. Face is symmetric. Moving all extremities. No gross focal neurologic deficits are appreciated. Skin: Skin is warm, dry and intact. No rash noted. Psychiatric: Mood and affect are normal. Speech and behavior are normal.  ____________________________________________   LABS (all labs ordered are listed, but only abnormal results are displayed)  Labs Reviewed  BASIC METABOLIC PANEL - Abnormal; Notable for the following components:      Result Value   Potassium 2.9 (*)    Glucose, Bld 105 (*)    Calcium 8.7 (*)    All other components within normal limits  CBC WITH DIFFERENTIAL/PLATELET - Abnormal; Notable for the following components:   HCT 38.6 (*)     Platelets 141 (*)    All other components within normal limits  RESP PANEL BY RT PCR (RSV, FLU A&B, COVID)  MAGNESIUM  TROPONIN I (HIGH SENSITIVITY)   ____________________________________________  EKG  ED ECG REPORT I, Rudene Re, the attending physician, personally viewed and interpreted this ECG.  Normal sinus rhythm, rate of 84, normal intervals, normal axis, no ST elevations or depressions. ____________________________________________  RADIOLOGY  I have personally reviewed the images performed during this visit and I agree with the Radiologist's read.   Interpretation by Radiologist:  DG Chest 2 View  Result Date: 04/12/2020 CLINICAL DATA:  Shortness of breath.  Cough and fever.  Wheezing. EXAM: CHEST - 2 VIEW COMPARISON:  Radiograph 01/20/2017, CT 11/02/2011 FINDINGS: Upper normal heart size with normal mediastinal contours. No pulmonary edema. There is mild peribronchial thickening with borderline hyperinflation. Streaky bibasilar opacities appear similar to prior exam and likely represent scarring. There is no evidence of acute or confluent airspace disease. No pleural effusion or pneumothorax. No acute osseous abnormalities are seen. IMPRESSION: Mild peribronchial thickening and borderline hyperinflation, may be related COPD, bronchitis or asthma. Streaky bibasilar opacities appear similar to prior exam and likely represent scarring. Electronically Signed   By: Keith Rake M.D.   On: 04/12/2020 18:36     ____________________________________________   PROCEDURES  Procedure(s) performed:yes .1-3 Lead EKG Interpretation Performed by: Rudene Re, MD Authorized by: Rudene Re, MD     Interpretation: normal     ECG rate assessment: normal     Rhythm: sinus rhythm     Ectopy: none     Critical Care performed:  Yes  CRITICAL CARE Performed by: Rudene Re  ?  Total critical care time: 40 min  Critical care time was exclusive of  separately billable procedures and treating other patients.  Critical care was necessary to treat or prevent imminent or life-threatening deterioration.  Critical care was time spent personally by me on the following activities: development of treatment plan with patient and/or surrogate as well as nursing, discussions with consultants, evaluation of patient's response to treatment, examination of patient, obtaining history from patient or surrogate, ordering and performing treatments and interventions, ordering and review of laboratory studies, ordering  and review of radiographic studies, pulse oximetry and re-evaluation of patient's condition.  ____________________________________________   INITIAL IMPRESSION / ASSESSMENT AND PLAN / ED COURSE   59 y.o. male with a history of COPD, smoking, hiatal hernia, hypertension, schizophrenia, pneumonia  who presents from his group home with productive cough, fever, chills, congestion x4 days.  Patient vaccinated for Covid.  Patient has bilateral coarse rhonchi and satting in the low 90s on room air which improved on 2 L nasal cannula.  Looks euvolemic.  EKG with no signs of acute ischemia.  Differential diagnoses including COPD exacerbation versus bronchitis versus pneumonia versus Covid versus flu versus RSV versus edema.  Labs show no leukocytosis.  Patient is afebrile here.  No signs of anemia.  Mild hypokalemia with a K of 2.9 which is supplemented p.o. and IV.  EKG showing no hyperkalemic changes.  Chest x-ray consistent with bronchitis, confirmed by radiology.  Covid/RSV/flu pending.  Will treat with duo nebs and steroids.  Old medical records reviewed.  Patient placed on telemetry for close monitoring.  Will reassess for disposition.  _________________________ 3:38 AM on 04/13/2020 -----------------------------------------  Covid, flu, RSV negative.  After steroids and 3 duo nebs patient ambulated and desatted to the upper 70s.  He required 6L [place]  for several minutes to recover. He is currently on 3 L nasal cannula.  Coarse rhonchi improved but is still present.  Will start patient on 10 mg of albuterol for an hour and consult hospitalist for admission.  Will cover patient with Augmentin.    _________________________ 4:58 AM on 04/13/2020 -----------------------------------------  After an hour of continuous albuterol patient's respiratory status has deteriorated.  He is tachypneic, remains hypoxic requiring 6 L nasal cannula for sats in the low 90s, grunting and splinting.  Continues to have wheezing.  Will place patient on BiPAP.  This changes been discussed with the hospitalist. _____________________________________________ Please note:  Patient was evaluated in Emergency Department today for the symptoms described in the history of present illness. Patient was evaluated in the context of the global COVID-19 pandemic, which necessitated consideration that the patient might be at risk for infection with the SARS-CoV-2 virus that causes COVID-19. Institutional protocols and algorithms that pertain to the evaluation of patients at risk for COVID-19 are in a state of rapid change based on information released by regulatory bodies including the CDC and federal and state organizations. These policies and algorithms were followed during the patient's care in the ED.  Some ED evaluations and interventions may be delayed as a result of limited staffing during the pandemic.   [place] Controlled Substance Database was reviewed by me. ____________________________________________   FINAL CLINICAL IMPRESSION(S) / ED DIAGNOSES   Final diagnoses:  COPD exacerbation ([place])  Acute respiratory failure with hypoxia ([place])  Hypokalemia      NEW MEDICATIONS STARTED DURING THIS VISIT:  ED Discharge Orders    None       Note:  This document was prepared using Dragon voice recognition software and may include unintentional dictation errors.      Alfred Levins, [place], MD 04/13/20 [place], [place], MD 04/13/20 [place], [place], MD 04/13/20 Ste[place], [place], MD 04/13/20 [id number]

## 2020-04-13 NOTE — ED Notes (Signed)
Pt's neb discontinued due to finished by [place], rn. Pt appears shob and is tachypnic. Will hold on transferring to cpod at this time in case pt needs bipap.

## 2020-04-13 NOTE — ED Notes (Signed)
RT at bedside.

## 2020-04-13 NOTE — ED Notes (Signed)
Pt taken off bipap and put on [place] at 4L at this time.

## 2020-04-13 NOTE — ED Notes (Signed)
Pt given cup of ice water 

## 2020-04-13 NOTE — ED Notes (Signed)
Report from noel, rn.  

## 2020-04-13 NOTE — ED Notes (Signed)
Pt up to 95% on 6 lpm att, adjusted back to 3 lpm

## 2020-04-13 NOTE — ED Notes (Signed)
Pt ambu to toilet, very ronchiorous post, sats 78% RA

## 2020-04-14 ENCOUNTER — Inpatient Hospital Stay: Payer: Medicare Other

## 2020-04-14 LAB — CBC
HCT: 36.3 % — ABNORMAL LOW (ref 39.0–52.0)
Hemoglobin: 12.6 g/dL — ABNORMAL LOW (ref 13.0–17.0)
MCH: 31.2 pg (ref 26.0–34.0)
MCHC: 34.7 g/dL (ref 30.0–36.0)
MCV: 89.9 fL (ref 80.0–100.0)
Platelets: 180 10*3/uL (ref 150–400)
RBC: 4.04 MIL/uL — ABNORMAL LOW (ref 4.22–5.81)
RDW: 13.2 % (ref 11.5–15.5)
WBC: 9.7 10*3/uL (ref 4.0–10.5)
nRBC: 0 % (ref 0.0–0.2)

## 2020-04-14 LAB — BLOOD GAS, ARTERIAL
Acid-Base Excess: 6.8 mmol/L — ABNORMAL HIGH (ref 0.0–2.0)
Bicarbonate: 31.3 mmol/L — ABNORMAL HIGH (ref 20.0–28.0)
FIO2: 0.36
O2 Saturation: 94.7 %
Patient temperature: 37
pCO2 arterial: 43 mmHg (ref 32.0–48.0)
pH, Arterial: 7.47 — ABNORMAL HIGH (ref 7.350–7.450)
pO2, Arterial: 69 mmHg — ABNORMAL LOW (ref 83.0–108.0)

## 2020-04-14 LAB — BASIC METABOLIC PANEL
Anion gap: 9 (ref 5–15)
BUN: 12 mg/dL (ref 8–23)
CO2: 28 mmol/L (ref 22–32)
Calcium: 9 mg/dL (ref 8.9–10.3)
Chloride: 98 mmol/L (ref 98–111)
Creatinine, Ser: 0.42 mg/dL — ABNORMAL LOW (ref 0.61–1.24)
GFR calc Af Amer: >60 mL/min (ref >60)
GFR calc non Af Amer: >60 mL/min (ref >60)
Glucose, Bld: 135 mg/dL — ABNORMAL HIGH (ref 70–99)
Potassium: 3.5 mmol/L (ref 3.5–5.1)
Sodium: 135 mmol/L (ref 135–145)

## 2020-04-14 MED ORDER — IOHEXOL 350 MG/ML SOLN
75.0000 mL | Freq: Once | INTRAVENOUS | Status: AC | PRN
  Administered 2020-04-14: 75 mL via INTRAVENOUS

## 2020-04-14 MED ORDER — ALUM & MAG HYDROXIDE-SIMETH 200-200-20 MG/5ML PO SUSP
30.0000 mL | ORAL | Status: DC | PRN
  Administered 2020-04-14 (×2): 30 mL via ORAL
  Filled 2020-04-14 (×2): qty 30

## 2020-04-14 MED ORDER — PANTOPRAZOLE SODIUM 40 MG PO TBEC
40.0000 mg | DELAYED_RELEASE_TABLET | Freq: Every day | ORAL | Status: DC
  Administered 2020-04-14 – 2020-04-20 (×7): 40 mg via ORAL
  Filled 2020-04-14 (×7): qty 1

## 2020-04-14 NOTE — Evaluation (Signed)
Physical Therapy Evaluation Patient Details Name: [name] MRN: [id number] DOB: 03-28-1961 Today's Date: 04/14/2020   History of Present Illness  59 y.o. male with medical history significant for COPD, HTN, smoking, hiatal hernia, schizophrenia, pneumonia  who presents from his group home with cough.  Patient reports one week history of SOB, cough with productive green sputum, congestion, subjective fevers, and chills.  Is complaining of a lot of congestion in his chest but denies chest pain.  Clinical Impression  Pt initially lethargic and not overly interactive, but was willing to participate and actually did relatively well with mobility.  His biggest issue was clearly his breathing/fatigue with any increased activity.  He was able to perform bed mobility, stand and take a few steps to the recliner with relative ease, but had significant labored breathing and fatigue with the modest effort (O2 drop to mid 80s on 2L).  Once CV issues are clear he will very likely be able to return home w/o HHPT or DME, will maintain on caseload to insure this does not change and to facilitate mobility as appropriate.    Follow Up Recommendations No PT follow up (will maintain on caseload to increase activity in house)    Equipment Recommendations  None recommended by PT per progress   Recommendations for Other Services       Precautions / Restrictions Precautions Precautions: Fall (moderate) Restrictions Weight Bearing Restrictions: No      Mobility  Bed Mobility Overal bed mobility: Modified Independent             General bed mobility comments: Pt able to get up to sitting w/ relative ease  Transfers Overall transfer level: Modified independent Equipment used: None             General transfer comment: Pt was able to stand from low bed setting, showed good confidence and did not need excessive UE use  Ambulation/Gait Ambulation/Gait assistance: Min guard Gait Distance (Feet):  5 Feet         General Gait Details: RW in pt's hand, but he did not appear to put any weight through it and essentially carried it with him in getting to the recliner.  After return to sitting his sats were 84-85% with labored breathing.  nursing in room and agrees with bumping back up to 4L.  Pt displayed good safety/balance/strength with the effort but poor CV tolerance  Stairs            Wheelchair Mobility    Modified Rankin (Stroke Patients Only)       Balance Overall balance assessment: Modified Independent                                           Pertinent Vitals/Pain Pain Assessment: No/denies pain    Home Living Family/patient expects to be discharged to:: Group home                 Additional Comments: Pt reports he is able to ambulate around the group home w/o issue and w/o AD    Prior Function Level of Independence: Independent         Comments: Pt is able to -+     Hand Dominance        Extremity/Trunk Assessment   Upper Extremity Assessment Upper Extremity Assessment: Overall WFL for tasks assessed    Lower Extremity Assessment Lower  Extremity Assessment: Overall WFL for tasks assessed       Communication   Communication: No difficulties  Cognition Arousal/Alertness: Awake/alert (pt keeping eyes closed much of the session) Behavior During Therapy: Flat affect Overall Cognitive Status: Within Functional Limits for tasks assessed                                        General Comments General comments (skin integrity, edema, etc.): On arrival pt's nasal cannula was mostly out of his nose, sats ~90%.  Up to mid 90s after a few minutes and per MD dropped to 2L with sats maintaining low 90s at this level.  Dropped quickly to mid 23s with very limited activity on 2L and bumped back to 4L secondary to labored breathing and low sats in sitting    Exercises     Assessment/Plan    PT Assessment  Patient needs continued PT services  PT Problem List Decreased activity tolerance;Cardiopulmonary status limiting activity;Decreased safety awareness       PT Treatment Interventions Gait training;Functional mobility training;Therapeutic activities;Therapeutic exercise;Balance training;Neuromuscular re-education;Patient/family education    PT Goals (Current goals can be found in the Care Plan section)  Acute Rehab PT Goals Patient Stated Goal: go home PT Goal Formulation: With patient Time For Goal Achievement: 04/28/20 Potential to Achieve Goals: Good    Frequency Min 2X/week   Barriers to discharge        Co-evaluation               AM-PAC PT "6 Clicks" Mobility  Outcome Measure Help needed turning from your back to your side while in a flat bed without using bedrails?: None Help needed moving from lying on your back to sitting on the side of a flat bed without using bedrails?: None Help needed moving to and from a bed to a chair (including a wheelchair)?: None Help needed standing up from a chair using your arms (e.g., wheelchair or bedside chair)?: None Help needed to walk in hospital room?: A Little Help needed climbing 3-5 steps with a railing? : A Little 6 Click Score: 22    End of Session Equipment Utilized During Treatment: Gait belt Activity Tolerance: Patient limited by fatigue Patient left: with chair alarm set;with call bell/phone within reach;with nursing/sitter in room   PT Visit Diagnosis: Muscle weakness (generalized) (M62.81);Other abnormalities of gait and mobility (R26.89)    Time: 1106-1130 PT Time Calculation (min) (ACUTE ONLY): 24 min   Charges:   PT Evaluation $PT Eval Low Complexity: 1 Low PT Treatments $Therapeutic Activity: 8-22 mins        Kreg Shropshire, DPT 04/14/2020, 1:08 PM

## 2020-04-14 NOTE — Treatment Plan (Signed)
Patient on heated high flow at this time. Pt states he can breathe easier is does not appear in distress. Pt attempted IS and achieved 250 x 10. Educated about need for patient to give his best to help his lungs at this time.

## 2020-04-14 NOTE — Progress Notes (Signed)
PROGRESS NOTE    [name]  GMW:[id number] DOB: 04-Jan-1961 DOA: 04/13/2020 PCP: Donnie Coffin, MD      Brief Narrative:  [name] is a 59 y.o. 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  In the ER, wheezing, CXR hyperinflated with cuffing.  Was severely tachypneic and out of breath, requiring BiPAP for respiratory support.          Assessment & Plan:  COPD exacerbation Patient admitted on BiPAP, weaned to supplemental oxygen by nasal cannula. Started on steroids, frequent bronchodilators, doxycycline.  Today the patient is still extremely wheezy, tight, with labored breathing. ABG shows respiratory alkalosis, still hypoxic, PCO2 normal. CTA chest obtained, shows no evidence of pulmonary embolism, no pneumonia, diffuse emphysematous changes. -Continue Solu-Medrol 3 times daily -Continue bronchodilators, scheduled and as needed -Continue doxycycline   -Continue ICS/LABA/LAMA -We'll start a high flow nasal cannula, just to ease work of breathing    Smoking Smoking cessation recommended -Continue nicotine patch  Hypertension Cerebrovascular disease secondary prevention Blood pressure slightly above average -Continue HCTZ, aspirin, atenolol, Plavix, diltiazem, pravastatin  Hypokalemia Mag normal. Potassium supplemented, resolved.  Schizoaffective disorder On [place]ve[place], no active psychosis. -Continue Seroquel, amantadine, mirtazapine  Polyneuropathy -Continue gabapentin         Disposition: Status is: Inpatient  Remains inpatient appropriate because:IV treatments appropriate due to intensity of illness or inability to take PO and Inpatient level of care appropriate due to severity of illness   Dispo: The patient is from: Home              Anticipated d/c is to: Home              Anticipated d/c date is: 2 days              Patient currently is not  medically stable to d/c.    The patient was admitted with a severe exacerbation of COPD. He remains very wheezy, tight, unable to get out of bed due to dyspnea. We have ruled out pneumonia, PE, but he remains very symptomatic.  We will escalate respiratory therapies, continue steroids bronchodilators.  Given the severity of his COPD exacerbation, the patient will likely need several more days of steroids and bronchodilator therapy. And will need to be weaned to room air likely.        MDM: The below labs and imaging reports reviewed and summarized above.  Medication management as above. This is a severe exacerbation of his chronic disease     DVT prophylaxis: enoxaparin (LOVENOX) injection 40 mg Start: 04/13/20 1000  Code Status: FULL Family Communication:     Consultants:     Procedures:     Antimicrobials:   Doxycycline 9/6 >>   Culture data:   COVID negative 9/6           Subjective: Patient is very dyspneic, wheezing, uncomfortable, feels "bad". No fever, confusion, vomiting. No chest pain.        Objective: Vitals:   04/14/20 1251 04/14/20 1448 04/14/20 1500 04/14/20 1606  BP:      Pulse:      Resp:   17 18  Temp:      TempSrc:      SpO2: 97% 96%    Weight:      Height:        Intake/Output Summary (Last 24 hours) at 04/14/2020 1636 Last data filed at 04/14/2020 1600  Gross per 24 hour  Intake 2164.86 ml  Output 2300 ml  Net -135.14 ml   Filed Weights   04/12/20 1811  Weight: 96.6 kg    Examination: BP (!) 143/96 (BP Location: Left Arm)   Pulse 97   Temp 98.3 F (36.8 C) (Oral)   Resp 18   Ht 6\' 1"  (1.854 m)   Wt 96.6 kg   SpO2 96%   BMI 28.10 kg/m   General: Thin adult male, lying in bed, appears uncomfortable, responding to questions. HEENT: Corneas clear, conjunctivae and sclerae normal without injection or icterus, lids and lashes normal.  Visual tracking smooth.  OP moist without erythema, exudates, cobblestoning,  or ulcers.  No airway deformities.  Neck supple.   Cardiac: Tachycardic, regular, nl S1-S2, no murmurs, rubs, gallops.  Capillary refill is less than 2 seconds.   Respiratory: Tachypneic, accessory muscle use is obvious, wheezing with inspiration and expiration, breath sounds are coarse. Abdomen: BS present.  No TTP or rebound all quadrants.  No masses or organomegaly.  No scars.  No striae, dilated veins, rashes, or lesions.  No ascites, distension. Extremities: No deformities/injuries.  5/5 grip strength and upper extremity flexion/extension, symmetrically.  Extremities are warm and well-perfused. Neuro: Sensorium intact.  Speech is fluent.  Naming is grossly intact, and the patient's recall, recent and remote, as well as general fund of knowledge seem within normal limits.  Muscle tone normal, without fasciculations.  Moves all extremities equally and with normal coordination.  Attention span and concentration are within normal limits.  Psych: Affect anxious.  Normal rate and rhythm of speech.  Thought content appropriate, and thought process linear.  No SI/HI, aural or visual hallucinations or delusions. Attention and concentration are normal.       Data Reviewed: I have personally reviewed following labs and imaging studies:  CBC: Recent Labs  Lab 04/12/20 1815 04/13/20 0729 04/14/20 0539  WBC 8.6 5.8 9.7  NEUTROABS 5.4  --   --   HGB 13.5 13.3 12.6*  HCT 38.6* 38.9* 36.3*  MCV 88.7 89.8 89.9  PLT 141* 145* 950   Basic Metabolic Panel: Recent Labs  Lab 04/12/20 1815 04/13/20 0147 04/13/20 0729 04/14/20 0539  NA 137  --  138 135  K 2.9*  --  3.4* 3.5  CL 98  --  99 98  CO2 28  --  28 28  GLUCOSE 105*  --  143* 135*  BUN 10  --  9 12  CREATININE 0.63  --  0.63 0.42*  CALCIUM 8.7*  --  8.9 9.0  MG  --  2.1  --   --    GFR: Estimated Creatinine Clearance: 118.8 mL/min (A) (by C-G formula based on SCr of 0.42 mg/dL (L)). Liver Function Tests: No results for input(s): AST,  ALT, ALKPHOS, BILITOT, PROT, ALBUMIN in the last 168 hours. No results for input(s): LIPASE, AMYLASE in the last 168 hours. No results for input(s): AMMONIA in the last 168 hours. Coagulation Profile: No results for input(s): INR, PROTIME in the last 168 hours. Cardiac Enzymes: No results for input(s): CKTOTAL, CKMB, CKMBINDEX, TROPONINI in the last 168 hours. BNP (last 3 results) No results for input(s): PROBNP in the last 8760 hours. HbA1C: No results for input(s): HGBA1C in the last 72 hours. CBG: No results for input(s): GLUCAP in the last 168 hours. Lipid Profile: No results for input(s): CHOL, HDL, LDLCALC, TRIG, CHOLHDL, LDLDIRECT in the last 72 hours. Thyroid Function Tests: No results for  input(s): TSH, T4TOTAL, FREET4, T3FREE, THYROIDAB in the last 72 hours. Anemia Panel: No results for input(s): VITAMINB12, FOLATE, FERRITIN, TIBC, IRON, RETICCTPCT in the last 72 hours. Urine analysis:    Component Value Date/Time   COLORURINE Colorless 12/27/2013 1921   APPEARANCEUR Clear 12/27/2013 1921   LABSPEC 1.002 12/27/2013 1921   PHURINE 6.0 12/27/2013 1921   GLUCOSEU Negative 12/27/2013 1921   HGBUR Negative 12/27/2013 1921   BILIRUBINUR Negative 12/27/2013 1921   KETONESUR Negative 12/27/2013 1921   PROTEINUR Negative 12/27/2013 1921   NITRITE Negative 12/27/2013 1921   LEUKOCYTESUR Negative 12/27/2013 1921   Sepsis Labs: @LABRCNTIP (procalcitonin:4,lacticacidven:4)  ) Recent Results (from the past 240 hour(s))  Resp Panel by RT PCR (RSV, Flu A&B, Covid) - Nasopharyngeal Swab     Status: None   Collection Time: 04/13/20  1:47 AM   Specimen: Nasopharyngeal Swab  Result Value Ref Range Status   SARS Coronavirus 2 by RT PCR NEGATIVE NEGATIVE Final    Comment: (NOTE) SARS-CoV-2 target nucleic acids are NOT DETECTED.  The SARS-CoV-2 RNA is generally detectable in upper respiratoy specimens during the acute phase of infection. The lowest concentration of SARS-CoV-2 viral  copies this assay can detect is 131 copies/mL. A negative result does not preclude SARS-Cov-2 infection and should not be used as the sole basis for treatment or other patient management decisions. A negative result may occur with  improper specimen collection/handling, submission of specimen other than nasopharyngeal swab, presence of viral mutation(s) within the areas targeted by this assay, and inadequate number of viral copies (<131 copies/mL). A negative result must be combined with clinical observations, patient history, and epidemiological information. The expected result is Negative.  Fact Sheet for Patients:  PinkCheek.be  Fact Sheet for Healthcare Providers:  GravelBags.it  This test is no t yet approved or cleared by the [place] FDA and  has been authorized for detection and/or diagnosis of SARS-CoV-2 by FDA under an Emergency Use Authorization (EUA). This EUA will remain  in effect (meaning this test can be used) for the duration of the COVID-19 declaration under Section 564(b)(1) of the Act, 21 U.S.C. section 360bbb-3(b)(1), unless the authorization is terminated or revoked sooner.     Influenza A by PCR NEGATIVE NEGATIVE Final   Influenza B by PCR NEGATIVE NEGATIVE Final    Comment: (NOTE) The Xpert Xpress SARS-CoV-2/FLU/RSV assay is intended as an aid in  the diagnosis of influenza from Nasopharyngeal swab specimens and  should not be used as a sole basis for treatment. Nasal washings and  aspirates are unacceptable for Xpert Xpress SARS-CoV-2/FLU/RSV  testing.  Fact Sheet for Patients: PinkCheek.be  Fact Sheet for Healthcare Providers: GravelBags.it  This test is not yet approved or cleared by the [place] FDA and  has been authorized for detection and/or diagnosis of SARS-CoV-2 by  FDA under an Emergency Use Authorization (EUA). This  EUA will remain  in effect (meaning this test can be used) for the duration of the  Covid-19 declaration under Section 564(b)(1) of the Act, 21  U.S.C. section 360bbb-3(b)(1), unless the authorization is  terminated or revoked.    Respiratory Syncytial Virus by PCR NEGATIVE NEGATIVE Final    Comment: (NOTE) Fact Sheet for Patients: PinkCheek.be  Fact Sheet for Healthcare Providers: GravelBags.it  This test is not yet approved or cleared by the [place] FDA and  has been authorized for detection and/or diagnosis of SARS-CoV-2 by  FDA under an Emergency Use Authorization (EUA). This EUA will remain  in effect (meaning this test can be used) for the duration of the  COVID-19 declaration under Section 564(b)(1) of the Act, 21 U.S.C.  section 360bbb-3(b)(1), unless the authorization is terminated or  revoked. Performed at [facility], [address] [place], [place] [address]          Radiology Studies: DG Chest 2 View  Result Date: 04/12/2020 CLINICAL DATA:  Shortness of breath.  Cough and fever.  Wheezing. EXAM: CHEST - 2 VIEW COMPARISON:  Radiograph 01/20/2017, CT 11/02/2011 FINDINGS: Upper normal heart size with normal mediastinal contours. No pulmonary edema. There is mild peribronchial thickening with borderline hyperinflation. Streaky bibasilar opacities appear similar to prior exam and likely represent scarring. There is no evidence of acute or confluent airspace disease. No pleural effusion or pneumothorax. No acute osseous abnormalities are seen. IMPRESSION: Mild peribronchial thickening and borderline hyperinflation, may be related COPD, bronchitis or asthma. Streaky bibasilar opacities appear similar to prior exam and likely represent scarring. Electronically Signed   By: Keith Rake M.D.   On: 04/12/2020 18:36   CT ANGIO CHEST PE W OR WO CONTRAST  Result Date: 04/14/2020 CLINICAL DATA:  Cough and  shortness of breath for 1 week EXAM: CT ANGIOGRAPHY CHEST WITH CONTRAST TECHNIQUE: Multidetector CT imaging of the chest was performed using the standard protocol during bolus administration of intravenous contrast. Multiplanar CT image reconstructions and MIPs were obtained to evaluate the vascular anatomy. CONTRAST:  19mL OMNIPAQUE IOHEXOL 350 MG/ML SOLN COMPARISON:  Chest x-ray from 04/12/2020, CT from 10/21/2014 FINDINGS: Cardiovascular: Thoracic aorta demonstrates atherosclerotic calcifications without aneurysmal dilatation or dissection. No significant cardiac enlargement is noted. Mild coronary calcifications are seen. The pulmonary artery shows a normal branching pattern. No filling defects to suggest pulmonary emboli are noted. Mild motion artifact limits the lower lobe evaluation. Mediastinum/Nodes: Thoracic inlet is within normal limits. No sizable hilar or mediastinal adenopathy is noted. The esophagus as visualized is within normal limits. Lungs/Pleura: Lungs are well aerated bilaterally. Diffuse emphysematous changes are noted. There is a 5 mm nodule in the right upper lobe laterally best seen on image number 44 series 6. Mild bibasilar atelectasis is noted. No sizable effusion is seen. Upper Abdomen: Bilateral adrenal nodules are again identified and stable from the prior exam of 2016 likely representing benign adenomas. The remainder of the upper abdomen shows no acute abnormality. Right renal cyst is noted. Musculoskeletal: No chest wall abnormality. No acute or significant osseous findings. Review of the MIP images confirms the above findings. IMPRESSION: No evidence of pulmonary emboli. 5 mm nodule in the right upper lobe laterally as described. No follow-up needed if patient is low-risk. Non-contrast chest CT can be considered in 12 months if patient is high-risk. This recommendation follows the consensus statement: Guidelines for Management of Incidental Pulmonary Nodules Detected on CT Images:  From the Fleischner Society 2017; Radiology 2017; 284:228-243. Stable bilateral adrenal lesions likely representing small adenomas. Aortic Atherosclerosis (ICD10-I70.0) and Emphysema (ICD10-J43.9). Electronically Signed   By: Inez Catalina M.D.   On: 04/14/2020 13:26        Scheduled Meds: . amantadine  100 mg Oral BID  . aspirin EC  81 mg Oral Daily  . atenolol  50 mg Oral Daily  . clopidogrel  75 mg Oral Daily  . diltiazem  180 mg Oral Daily  . enoxaparin (LOVENOX) injection  40 mg Subcutaneous Q24H  . gabapentin  100 mg Oral QHS  . hydrochlorothiazide  12.5 mg Oral Daily  . ipratropium-albuterol  3 mL Nebulization Q6H  . melatonin  2.5 mg Oral QHS  . methylPREDNISolone (SOLU-MEDROL) injection  60 mg Intravenous TID  . mirtazapine  30 mg Oral QHS  . mometasone-formoterol  2 puff Inhalation BID  . nicotine  14 mg Transdermal Daily  . pantoprazole  40 mg Oral Daily  . pravastatin  20 mg Oral q1800  . QUEtiapine  200 mg Oral QHS  . tiotropium  18 mcg Inhalation Daily   Continuous Infusions: . doxycycline (VIBRAMYCIN) IV 100 mg (04/14/20 0931)     LOS: 1 day    Time spent: 35 minutes    Edwin Dada, MD [facility] 04/14/2020, 4:36 PM     Please [place] though [place] or Epic secure chat:  For password, contact charge nurse

## 2020-04-14 NOTE — TOC Initial Note (Signed)
Transition of Care [facility]) - Initial/Assessment Note    Patient Details  Name: [name] MRN: [id number] Date of Birth: 1960-09-06  Transition of [facility]) CM/SW Contact:    Anselm Pancoast, RN Phone Number: 04/14/2020, 10:38 AM  Clinical Narrative:                 Anticipate patient will return to [facility] group home. Number obtained from internet is [phone]-no answer and voicemail full. Attempted home number and mobile number with no option to leave message. Need to confirm patient returning to [facility]. Attempted to contact sister with no answer and no option to leave voicemail.         Patient Goals and CMS Choice        Expected Discharge Plan and Services                                                Prior Living Arrangements/Services                       Activities of Daily Living Home Assistive Devices/Equipment: None ADL Screening (condition at time of admission) Patient's cognitive ability adequate to safely complete daily activities?: Yes Is the patient deaf or have difficulty hearing?: No Does the patient have difficulty seeing, even when wearing glasses/contacts?: No Does the patient have difficulty concentrating, remembering, or making decisions?: No Patient able to express need for assistance with ADLs?: Yes Does the patient have difficulty dressing or bathing?: No Independently performs ADLs?: Yes (appropriate for developmental age) Does the patient have difficulty walking or climbing stairs?: No Weakness of Legs: Both Weakness of Arms/Hands: None  Permission Sought/Granted                  Emotional Assessment              Admission diagnosis:  Hypokalemia [E87.6] COPD exacerbation ([place]) [J44.1] Acute respiratory failure with hypoxia ([place]) [J96.01] Patient Active Problem List   Diagnosis Date Noted  . Hypokalemia 04/13/2020  . Essential hypertension 04/13/2020  . Coagulation disorder ([place])  12/16/2019  . Pain due to onychomycosis of toenails of both feet 02/14/2019  . COPD exacerbation ([place]) 01/16/2017  . Ventral hernia 12/18/2014  . Hematoma of leg 01/30/2014   PCP:  Donnie Coffin, MD Pharmacy:   [facility], [place] - [place] [address] [place] [address] Phone: [phone] Fax: [phone]     Social Determinants of Health (SDOH) Interventions    Readmission Risk Interventions No flowsheet data found.

## 2020-04-15 DIAGNOSIS — J9601 Acute respiratory failure with hypoxia: Secondary | ICD-10-CM

## 2020-04-15 DIAGNOSIS — I10 Essential (primary) hypertension: Secondary | ICD-10-CM

## 2020-04-15 LAB — CBC
HCT: 33.3 % — ABNORMAL LOW (ref 39.0–52.0)
Hemoglobin: 11.3 g/dL — ABNORMAL LOW (ref 13.0–17.0)
MCH: 30.9 pg (ref 26.0–34.0)
MCHC: 33.9 g/dL (ref 30.0–36.0)
MCV: 91 fL (ref 80.0–100.0)
Platelets: 199 10*3/uL (ref 150–400)
RBC: 3.66 MIL/uL — ABNORMAL LOW (ref 4.22–5.81)
RDW: 13.1 % (ref 11.5–15.5)
WBC: 13.7 10*3/uL — ABNORMAL HIGH (ref 4.0–10.5)
nRBC: 0 % (ref 0.0–0.2)

## 2020-04-15 LAB — BASIC METABOLIC PANEL
Anion gap: 7 (ref 5–15)
BUN: 12 mg/dL (ref 8–23)
CO2: 30 mmol/L (ref 22–32)
Calcium: 8.8 mg/dL — ABNORMAL LOW (ref 8.9–10.3)
Chloride: 99 mmol/L (ref 98–111)
Creatinine, Ser: 0.67 mg/dL (ref 0.61–1.24)
GFR calc Af Amer: >60 mL/min (ref >60)
GFR calc non Af Amer: >60 mL/min (ref >60)
Glucose, Bld: 152 mg/dL — ABNORMAL HIGH (ref 70–99)
Potassium: 3.8 mmol/L (ref 3.5–5.1)
Sodium: 136 mmol/L (ref 135–145)

## 2020-04-15 LAB — HEMOGLOBIN AND HEMATOCRIT, BLOOD
HCT: 35.5 % — ABNORMAL LOW (ref 39.0–52.0)
Hemoglobin: 12 g/dL — ABNORMAL LOW (ref 13.0–17.0)

## 2020-04-15 NOTE — Progress Notes (Signed)
Physical Therapy Treatment Patient Details Name: [name] MRN: [id number] DOB: 1960/08/27 Today's Date: 04/15/2020    History of Present Illness 59 y.o. male with medical history significant for COPD, HTN, smoking, hiatal hernia, schizophrenia, pneumonia  who presents from his group home with cough.  Patient reports one week history of SOB, cough with productive green sputum, congestion, subjective fevers, and chills.  Is complaining of a lot of congestion in his chest but denies chest pain.    PT Comments    Pt ready for session.  To EOB with rail and no assist.  Steady in sitting.  Stood to RW x 1 for 2 sets standing ex with walker.  O2 >92% during activity on HFNC.  He requests to sit in chair.  Given short cords, he lays back in bed and gets up on L and transfers to recliner with min guard for safety.  Overall does well.  Alarm set and tech updated on mobility.  Needs met.   Follow Up Recommendations  No PT follow up     Equipment Recommendations  None recommended by PT    Recommendations for Other Services       Precautions / Restrictions Precautions Precautions: Fall Restrictions Weight Bearing Restrictions: No    Mobility  Bed Mobility Overal bed mobility: Modified Independent                Transfers Overall transfer level: Modified independent Equipment used: Rolling walker (2 wheeled)                Ambulation/Gait         Gait velocity: decreased   General Gait Details: Gait limited due to HFNC to standing and ex at bedside.   Stairs             Wheelchair Mobility    Modified Rankin (Stroke Patients Only)       Balance Overall balance assessment: Modified Independent                                          Cognition Arousal/Alertness: Awake/alert (pt keeping eyes closed much of the session) Behavior During Therapy: Flat affect Overall Cognitive Status: Within Functional Limits for tasks assessed                                         Exercises Other Exercises Other Exercises: standing at bedside with RW - marching in place, SLR 2 x 10 with seated rest    General Comments        Pertinent Vitals/Pain Pain Assessment: No/denies pain    Home Living                      Prior Function            PT Goals (current goals can now be found in the care plan section) Progress towards PT goals: Progressing toward goals    Frequency    Min 2X/week      PT Plan Current plan remains appropriate    Co-evaluation              AM-PAC PT "6 Clicks" Mobility   Outcome Measure  Help needed turning from your back to your side while in a flat bed without using  bedrails?: None Help needed moving from lying on your back to sitting on the side of a flat bed without using bedrails?: None Help needed moving to and from a bed to a chair (including a wheelchair)?: A Little Help needed standing up from a chair using your arms (e.g., wheelchair or bedside chair)?: A Little Help needed to walk in hospital room?: A Little Help needed climbing 3-5 steps with a railing? : A Little 6 Click Score: 20    End of Session Equipment Utilized During Treatment: Gait belt Activity Tolerance: Patient tolerated treatment well Patient left: with chair alarm set;with call bell/phone within reach;in chair Nurse Communication: Mobility status       Time: 1340-1353 PT Time Calculation (min) (ACUTE ONLY): 13 min  Charges:  $Therapeutic Exercise: 8-22 mins                    Chesley Noon, PTA 04/15/20, 2:52 PM

## 2020-04-15 NOTE — TOC Initial Note (Signed)
Transition of Care [facility]) - Initial/Assessment Note    Patient Details  Name: [name] MRN: [id number] Date of Birth: 24-Jul-1961  Transition of Care [facility]) CM/SW Contact:    Candie Chroman, LCSW Phone Number: 04/15/2020, 10:34 AM  Clinical Narrative:  Readmission prevention screen complete. CSW met with patient. No supports at bedside. CSW introduced role and explained that discharge planning would be discussed. Patient is from [facility] group home. PCP is Tomasa Hose, MD. The group home transports him to appointments. Pharmacy is [facility]. The group home gets his medications for him. Patient did not have home health or use DME prior to admission. Patient was not on oxygen prior to admission. CSW spoke with group home staff member. They will transport him back to the facility once discharged. No further concerns. CSW encouraged patient to contact CSW as needed. CSW will continue to follow patient for support and facilitate return to the group home when stable.  Expected Discharge Plan: Group Home Barriers to Discharge: Continued Medical Work up   Patient Goals and CMS Choice     Choice offered to / list presented to : NA  Expected Discharge Plan and Services Expected Discharge Plan: Group Home     Post Acute Care Choice: NA Living arrangements for the past 2 months: Group Home                                      Prior Living Arrangements/Services Living arrangements for the past 2 months: Group Home Lives with:: Facility Resident Patient language and need for interpreter reviewed:: Yes Do you feel safe going back to the place where you live?: Yes      Need for Family Participation in Patient Care: Yes (Comment) Care giver support system in place?: Yes (comment)   Criminal Activity/Legal Involvement Pertinent to Current Situation/Hospitalization: No - Comment as needed  Activities of Daily Living Home Assistive Devices/Equipment: None ADL Screening  (condition at time of admission) Patient's cognitive ability adequate to safely complete daily activities?: Yes Is the patient deaf or have difficulty hearing?: No Does the patient have difficulty seeing, even when wearing glasses/contacts?: No Does the patient have difficulty concentrating, remembering, or making decisions?: No Patient able to express need for assistance with ADLs?: Yes Does the patient have difficulty dressing or bathing?: No Independently performs ADLs?: Yes (appropriate for developmental age) Does the patient have difficulty walking or climbing stairs?: No Weakness of Legs: Both Weakness of Arms/Hands: None  Permission Sought/Granted Permission sought to share information with : Facility Art therapist granted to share information with : Yes, Verbal Permission Granted     Permission granted to share info w AGENCY: [facility] granted to share info w Contact Information: [phone]  Emotional Assessment Appearance:: Appears stated age Attitude/Demeanor/Rapport: Engaged, Gracious Affect (typically observed): Accepting, Appropriate, Calm Orientation: : Oriented to Self, Oriented to Place, Oriented to  Time, Oriented to Situation Alcohol / Substance Use: Not Applicable Psych Involvement: No (comment)  Admission diagnosis:  Hypokalemia [E87.6] COPD exacerbation ([place]) [J44.1] Acute respiratory failure with hypoxia ([place]) [J96.01] Patient Active Problem List   Diagnosis Date Noted   Hypokalemia 04/13/2020   Essential hypertension 04/13/2020   Coagulation disorder ([place]) 12/16/2019   Pain due to onychomycosis of toenails of both feet 02/14/2019   COPD exacerbation ([place]) 01/16/2017   Ventral hernia 12/18/2014  Hematoma of leg 01/30/2014   PCP:  Donnie Coffin, MD Pharmacy:   [facility], [place] - [address] [place] [address] Phone:  [phone] Fax: [phone]     Social Determinants of Health ([place]) Interventions    Readmission Risk Interventions Readmission Risk Prevention Plan 04/15/2020  Transportation Screening Complete  PCP or Specialist Appt within 3-5 Days Complete  Social Work Consult for [place] Planning/Counseling Complete  Palliative Care Screening Not Applicable  Medication Review Press photographer) Complete  Some recent data might be hidden

## 2020-04-15 NOTE — Progress Notes (Signed)
PROGRESS NOTE    [name]  WUJ:[id number] DOB: April 27, 1961 DOA: 04/13/2020 PCP: Donnie Coffin, MD   Assessment & Plan:   Principal Problem:   COPD exacerbation ([place]) Active Problems:   Hypokalemia   Essential hypertension   COPD exacerbation: initially required BiPAP but that has since been weaned off. Continue on supplemental oxygen and wean as tolerated. Continue on steroids, bronchodilators, doxycycline. Encourage incentive spirometry. CTA shows no PE, pneumonia or emphysematous changes.   Acute hypoxic respiratory failure: secondary to above. Continue on supplemental oxygen and wean as tolerated, currently on HFNC. Management as stated above  Leukocytosis: possibly secondary to steroid use vs infection. Will continue to monitor   Smoking: nicotine patch to prevent w/drawal. Smoking cessation counseling   HTN: continue on atenolol, diltiazem, HCTZ  HLD: continue on statin   Hypokalemia: WNL today. Will continue to monitor  Schizoaffective disorder: continue on invega, seroquel, amantadine, mirtazapine   Polyneuropathy: continue on gabapentin    DVT prophylaxis:  Code Status: full  Family Communication: Disposition Plan: depends on PT/OT recs  Status is: Inpatient  Remains inpatient appropriate because:Inpatient level of care appropriate due to severity of illness currently on HFNC    Dispo: The patient is from: Home              Anticipated d/c is to: Home vs SNF               Anticipated d/c date is: 3 days              Patient currently is not medically stable to d/c.    Consultants:      Procedures:    Antimicrobials:    Subjective: Pt c/o shortness of breath   Objective: Vitals:   04/14/20 1917 04/14/20 2035 04/15/20 0603 04/15/20 0744  BP:  117/86 110/81 115/81  Pulse:  80 65 71  Resp:  18 20 18   Temp:  97.7 F (36.5 C) 97.8 F (36.6 C) 97.6 F (36.4 C)  TempSrc:  Oral Oral Oral  SpO2: 97% 99% 100% 99%  Weight:      Height:         Intake/Output Summary (Last 24 hours) at 04/15/2020 0811 Last data filed at 04/15/2020 0615 Gross per 24 hour  Intake 1892.35 ml  Output 2675 ml  Net -782.65 ml   Filed Weights   04/12/20 1811  Weight: 96.6 kg    Examination:  General exam: Appears calm and comfortable  Respiratory system: diminished breath sounds b/l. No rales  Cardiovascular system: S1 & S2 +. No  rubs, gallops or clicks.  Gastrointestinal system: Abdomen is obese, soft and nontender. Normal bowel sounds heard. Central nervous system: Alert and oriented. Moves all 4 extremities  Psychiatry: Judgement and insight appear normal. Mood & affect appropriate.     Data Reviewed: I have personally reviewed following labs and imaging studies  CBC: Recent Labs  Lab 04/12/20 1815 04/13/20 0729 04/14/20 0539 04/15/20 0513  WBC 8.6 5.8 9.7 13.7*  NEUTROABS 5.4  --   --   --   HGB 13.5 13.3 12.6* 11.3*  HCT 38.6* 38.9* 36.3* 33.3*  MCV 88.7 89.8 89.9 91.0  PLT 141* 145* 180 956   Basic Metabolic Panel: Recent Labs  Lab 04/12/20 1815 04/13/20 0147 04/13/20 0729 04/14/20 0539 04/15/20 0513  NA 137  --  138 135 136  K 2.9*  --  3.4* 3.5 3.8  CL 98  --  99 98 99  CO2  28  --  28 28 30   GLUCOSE 105*  --  143* 135* 152*  BUN 10  --  9 12 12   CREATININE 0.63  --  0.63 0.42* 0.67  CALCIUM 8.7*  --  8.9 9.0 8.8*  MG  --  2.1  --   --   --    GFR: Estimated Creatinine Clearance: 118.8 mL/min (by C-G formula based on SCr of 0.67 mg/dL). Liver Function Tests: No results for input(s): AST, ALT, ALKPHOS, BILITOT, PROT, ALBUMIN in the last 168 hours. No results for input(s): LIPASE, AMYLASE in the last 168 hours. No results for input(s): AMMONIA in the last 168 hours. Coagulation Profile: No results for input(s): INR, PROTIME in the last 168 hours. Cardiac Enzymes: No results for input(s): CKTOTAL, CKMB, CKMBINDEX, TROPONINI in the last 168 hours. BNP (last 3 results) No results for input(s): PROBNP in the  last 8760 hours. HbA1C: No results for input(s): HGBA1C in the last 72 hours. CBG: No results for input(s): GLUCAP in the last 168 hours. Lipid Profile: No results for input(s): CHOL, HDL, LDLCALC, TRIG, CHOLHDL, LDLDIRECT in the last 72 hours. Thyroid Function Tests: No results for input(s): TSH, T4TOTAL, FREET4, T3FREE, THYROIDAB in the last 72 hours. Anemia Panel: No results for input(s): VITAMINB12, FOLATE, FERRITIN, TIBC, IRON, RETICCTPCT in the last 72 hours. Sepsis Labs: No results for input(s): PROCALCITON, LATICACIDVEN in the last 168 hours.  Recent Results (from the past 240 hour(s))  Resp Panel by RT PCR (RSV, Flu A&B, Covid) - Nasopharyngeal Swab     Status: None   Collection Time: 04/13/20  1:47 AM   Specimen: Nasopharyngeal Swab  Result Value Ref Range Status   SARS Coronavirus 2 by RT PCR NEGATIVE NEGATIVE Final    Comment: (NOTE) SARS-CoV-2 target nucleic acids are NOT DETECTED.  The SARS-CoV-2 RNA is generally detectable in upper respiratoy specimens during the acute phase of infection. The lowest concentration of SARS-CoV-2 viral copies this assay can detect is 131 copies/mL. A negative result does not preclude SARS-Cov-2 infection and should not be used as the sole basis for treatment or other patient management decisions. A negative result may occur with  improper specimen collection/handling, submission of specimen other than nasopharyngeal swab, presence of viral mutation(s) within the areas targeted by this assay, and inadequate number of viral copies (<131 copies/mL). A negative result must be combined with clinical observations, patient history, and epidemiological information. The expected result is Negative.  Fact Sheet for Patients:  PinkCheek.be  Fact Sheet for Healthcare Providers:  GravelBags.it  This test is no t yet approved or cleared by the [place] FDA and  has been authorized  for detection and/or diagnosis of SARS-CoV-2 by FDA under an Emergency Use Authorization (EUA). This EUA will remain  in effect (meaning this test can be used) for the duration of the COVID-19 declaration under Section 564(b)(1) of the Act, 21 U.S.C. section 360bbb-3(b)(1), unless the authorization is terminated or revoked sooner.     Influenza A by PCR NEGATIVE NEGATIVE Final   Influenza B by PCR NEGATIVE NEGATIVE Final    Comment: (NOTE) The Xpert Xpress SARS-CoV-2/FLU/RSV assay is intended as an aid in  the diagnosis of influenza from Nasopharyngeal swab specimens and  should not be used as a sole basis for treatment. Nasal washings and  aspirates are unacceptable for Xpert Xpress SARS-CoV-2/FLU/RSV  testing.  Fact Sheet for Patients: PinkCheek.be  Fact Sheet for Healthcare Providers: GravelBags.it  This test is not yet approved  or cleared by the [place] and  has been authorized for detection and/or diagnosis of SARS-CoV-2 by  FDA under an Emergency Use Authorization (EUA). This EUA will remain  in effect (meaning this test can be used) for the duration of the  Covid-19 declaration under Section 564(b)(1) of the Act, 21  U.S.C. section 360bbb-3(b)(1), unless the authorization is  terminated or revoked.    Respiratory Syncytial Virus by PCR NEGATIVE NEGATIVE Final    Comment: (NOTE) Fact Sheet for Patients: PinkCheek.be  Fact Sheet for Healthcare Providers: GravelBags.it  This test is not yet approved or cleared by the [place] FDA and  has been authorized for detection and/or diagnosis of SARS-CoV-2 by  FDA under an Emergency Use Authorization (EUA). This EUA will remain  in effect (meaning this test can be used) for the duration of the  COVID-19 declaration under Section 564(b)(1) of the Act, 21 U.S.C.  section 360bbb-3(b)(1), unless the  authorization is terminated or  revoked. Performed at [facility], [address] [place], [place] [address]          Radiology Studies: CT ANGIO CHEST PE W OR WO CONTRAST  Result Date: 04/14/2020 CLINICAL DATA:  Cough and shortness of breath for 1 week EXAM: CT ANGIOGRAPHY CHEST WITH CONTRAST TECHNIQUE: Multidetector CT imaging of the chest was performed using the standard protocol during bolus administration of intravenous contrast. Multiplanar CT image reconstructions and MIPs were obtained to evaluate the vascular anatomy. CONTRAST:  28mL OMNIPAQUE IOHEXOL 350 MG/ML SOLN COMPARISON:  Chest x-ray from 04/12/2020, CT from 10/21/2014 FINDINGS: Cardiovascular: Thoracic aorta demonstrates atherosclerotic calcifications without aneurysmal dilatation or dissection. No significant cardiac enlargement is noted. Mild coronary calcifications are seen. The pulmonary artery shows a normal branching pattern. No filling defects to suggest pulmonary emboli are noted. Mild motion artifact limits the lower lobe evaluation. Mediastinum/Nodes: Thoracic inlet is within normal limits. No sizable hilar or mediastinal adenopathy is noted. The esophagus as visualized is within normal limits. Lungs/Pleura: Lungs are well aerated bilaterally. Diffuse emphysematous changes are noted. There is a 5 mm nodule in the right upper lobe laterally best seen on image number 44 series 6. Mild bibasilar atelectasis is noted. No sizable effusion is seen. Upper Abdomen: Bilateral adrenal nodules are again identified and stable from the prior exam of 2016 likely representing benign adenomas. The remainder of the upper abdomen shows no acute abnormality. Right renal cyst is noted. Musculoskeletal: No chest wall abnormality. No acute or significant osseous findings. Review of the MIP images confirms the above findings. IMPRESSION: No evidence of pulmonary emboli. 5 mm nodule in the right upper lobe laterally as described. No  follow-up needed if patient is low-risk. Non-contrast chest CT can be considered in 12 months if patient is high-risk. This recommendation follows the consensus statement: Guidelines for Management of Incidental Pulmonary Nodules Detected on CT Images: From the Fleischner Society 2017; Radiology 2017; 284:228-243. Stable bilateral adrenal lesions likely representing small adenomas. Aortic Atherosclerosis (ICD10-I70.0) and Emphysema (ICD10-J43.9). Electronically Signed   By: Inez Catalina M.D.   On: 04/14/2020 13:26        Scheduled Meds: . amantadine  100 mg Oral BID  . aspirin EC  81 mg Oral Daily  . atenolol  50 mg Oral Daily  . clopidogrel  75 mg Oral Daily  . diltiazem  180 mg Oral Daily  . enoxaparin (LOVENOX) injection  40 mg Subcutaneous Q24H  . gabapentin  100 mg Oral QHS  . hydrochlorothiazide  12.5 mg Oral Daily  . ipratropium-albuterol  3 mL Nebulization Q6H  . melatonin  2.5 mg Oral QHS  . methylPREDNISolone (SOLU-MEDROL) injection  60 mg Intravenous TID  . mirtazapine  30 mg Oral QHS  . mometasone-formoterol  2 puff Inhalation BID  . nicotine  14 mg Transdermal Daily  . pantoprazole  40 mg Oral Daily  . pravastatin  20 mg Oral q1800  . QUEtiapine  200 mg Oral QHS  . tiotropium  18 mcg Inhalation Daily   Continuous Infusions: . doxycycline (VIBRAMYCIN) IV Stopped (04/14/20 2314)     LOS: 2 days    Time spent: 31 mins    Wyvonnia Dusky, MD [facility] Pager [phone]  If 7PM-7AM, please contact night-coverage [email or web address] 04/15/2020, 8:11 AM

## 2020-04-16 LAB — BASIC METABOLIC PANEL WITH GFR
Anion gap: 9 (ref 5–15)
BUN: 11 mg/dL (ref 8–23)
CO2: 30 mmol/L (ref 22–32)
Calcium: 8.7 mg/dL — ABNORMAL LOW (ref 8.9–10.3)
Chloride: 97 mmol/L — ABNORMAL LOW (ref 98–111)
Creatinine, Ser: 0.65 mg/dL (ref 0.61–1.24)
GFR calc Af Amer: >60 mL/min
GFR calc non Af Amer: >60 mL/min
Glucose, Bld: 136 mg/dL — ABNORMAL HIGH (ref 70–99)
Potassium: 3.3 mmol/L — ABNORMAL LOW (ref 3.5–5.1)
Sodium: 136 mmol/L (ref 135–145)

## 2020-04-16 LAB — CBC
HCT: 31.4 % — ABNORMAL LOW (ref 39.0–52.0)
Hemoglobin: 11.3 g/dL — ABNORMAL LOW (ref 13.0–17.0)
MCH: 31.1 pg (ref 26.0–34.0)
MCHC: 36 g/dL (ref 30.0–36.0)
MCV: 86.5 fL (ref 80.0–100.0)
Platelets: 204 K/uL (ref 150–400)
RBC: 3.63 MIL/uL — ABNORMAL LOW (ref 4.22–5.81)
RDW: 12.8 % (ref 11.5–15.5)
WBC: 13.1 K/uL — ABNORMAL HIGH (ref 4.0–10.5)
nRBC: 0 % (ref 0.0–0.2)

## 2020-04-16 MED ORDER — POTASSIUM CHLORIDE CRYS ER 20 MEQ PO TBCR
40.0000 meq | EXTENDED_RELEASE_TABLET | Freq: Once | ORAL | Status: AC
  Administered 2020-04-16: 40 meq via ORAL
  Filled 2020-04-16: qty 2

## 2020-04-16 NOTE — Progress Notes (Signed)
PROGRESS NOTE    [name]  JSH:[id number] DOB: [birth year] DOA: 04/13/2020 PCP: Donnie Coffin, MD   Assessment & Plan:   Principal Problem:   COPD exacerbation ([place]) Active Problems:   Hypokalemia   Essential hypertension   COPD exacerbation: initially required BiPAP but that has since been weaned off. Continue on supplemental oxygen and wean as tolerated, on HFNC. Continue on steroids, bronchodilators, doxycycline. Encourage incentive spirometry. CTA shows no PE, pneumonia or emphysematous changes. Unchanged from previous day   Acute hypoxic respiratory failure: secondary to above. Unchanged from day prior. Continue on supplemental oxygen and wean as tolerated, still on HFNC. Management as stated above  Leukocytosis: possibly secondary to steroid use vs infection. Will continue to monitor   Smoking: nicotine patch to prevent w/drawal. Smoking cessation counseling   HTN: continue on atenolol, diltiazem, HCTZ  HLD: continue on statin   Hypokalemia: KCl repleted. Will continue to monitor  Schizoaffective disorder: continue on invega, seroquel, amantadine, mirtazapine   Polyneuropathy: continue on neurontin    DVT prophylaxis: lovenox Code Status: full  Family Communication: Disposition Plan: likely d/c back to group home   Status is: Inpatient  Remains inpatient appropriate because:Inpatient level of care appropriate due to severity of illness currently on HFNC    Dispo: The patient is from: Home              Anticipated d/c is to: Home (back to group home)              Anticipated d/c date is: 3 days              Patient currently is not medically stable to d/c.    Consultants:      Procedures:    Antimicrobials:    Subjective: Pt c/o shortness of breath and the same as day prior  Objective: Vitals:   04/15/20 2021 04/16/20 0118 04/16/20 0420 04/16/20 0545  BP: 114/82   121/79  Pulse: 71   85  Resp: 16   20  Temp: 97.8 F (36.6 C)   98.4 F  (36.9 C)  TempSrc: Oral   Oral  SpO2: 98% 94% 93% 99%  Weight:      Height:        Intake/Output Summary (Last 24 hours) at 04/16/2020 0729 Last data filed at 04/16/2020 0717 Gross per 24 hour  Intake 250 ml  Output 1950 ml  Net -1700 ml   Filed Weights   04/12/20 1811  Weight: 96.6 kg    Examination:  General exam: Appears calm and comfortable  Respiratory system: decreased breath sounds b/l. No rales Cardiovascular system: S1 & S2 +. No  rubs, gallops or clicks.  Gastrointestinal system: Abdomen is obese, soft and nontender. Hypoactive bowel sounds heard. Central nervous system: Alert and oriented. Moves all 4 extremities  Psychiatry: Judgement and insight appear normal. Flat mood and affect.    Data Reviewed: I have personally reviewed following labs and imaging studies  CBC: Recent Labs  Lab 04/12/20 1815 04/12/20 1815 04/13/20 0729 04/14/20 0539 04/15/20 0513 04/15/20 1009 04/16/20 0611  WBC 8.6  --  5.8 9.7 13.7*  --  13.1*  NEUTROABS 5.4  --   --   --   --   --   --   HGB 13.5   < > 13.3 12.6* 11.3* 12.0* 11.3*  HCT 38.6*   < > 38.9* 36.3* 33.3* 35.5* 31.4*  MCV 88.7  --  89.8 89.9 91.0  --  86.5  PLT 141*  --  145* 180 199  --  204   < > = values in this interval not displayed.   Basic Metabolic Panel: Recent Labs  Lab 04/12/20 1815 04/13/20 0147 04/13/20 0729 04/14/20 0539 04/15/20 0513 04/16/20 0611  NA 137  --  138 135 136 136  K 2.9*  --  3.4* 3.5 3.8 3.3*  CL 98  --  99 98 99 97*  CO2 28  --  28 28 30 30   GLUCOSE 105*  --  143* 135* 152* 136*  BUN 10  --  9 12 12 11   CREATININE 0.63  --  0.63 0.42* 0.67 0.65  CALCIUM 8.7*  --  8.9 9.0 8.8* 8.7*  MG  --  2.1  --   --   --   --    GFR: Estimated Creatinine Clearance: 118.8 mL/min (by C-G formula based on SCr of 0.65 mg/dL). Liver Function Tests: No results for input(s): AST, ALT, ALKPHOS, BILITOT, PROT, ALBUMIN in the last 168 hours. No results for input(s): LIPASE, AMYLASE in the last  168 hours. No results for input(s): AMMONIA in the last 168 hours. Coagulation Profile: No results for input(s): INR, PROTIME in the last 168 hours. Cardiac Enzymes: No results for input(s): CKTOTAL, CKMB, CKMBINDEX, TROPONINI in the last 168 hours. BNP (last 3 results) No results for input(s): PROBNP in the last 8760 hours. HbA1C: No results for input(s): HGBA1C in the last 72 hours. CBG: No results for input(s): GLUCAP in the last 168 hours. Lipid Profile: No results for input(s): CHOL, HDL, LDLCALC, TRIG, CHOLHDL, LDLDIRECT in the last 72 hours. Thyroid Function Tests: No results for input(s): TSH, T4TOTAL, FREET4, T3FREE, THYROIDAB in the last 72 hours. Anemia Panel: No results for input(s): VITAMINB12, FOLATE, FERRITIN, TIBC, IRON, RETICCTPCT in the last 72 hours. Sepsis Labs: No results for input(s): PROCALCITON, LATICACIDVEN in the last 168 hours.  Recent Results (from the past 240 hour(s))  Resp Panel by RT PCR (RSV, Flu A&B, Covid) - Nasopharyngeal Swab     Status: None   Collection Time: 04/13/20  1:47 AM   Specimen: Nasopharyngeal Swab  Result Value Ref Range Status   SARS Coronavirus 2 by RT PCR NEGATIVE NEGATIVE Final    Comment: (NOTE) SARS-CoV-2 target nucleic acids are NOT DETECTED.  The SARS-CoV-2 RNA is generally detectable in upper respiratoy specimens during the acute phase of infection. The lowest concentration of SARS-CoV-2 viral copies this assay can detect is 131 copies/mL. A negative result does not preclude SARS-Cov-2 infection and should not be used as the sole basis for treatment or other patient management decisions. A negative result may occur with  improper specimen collection/handling, submission of specimen other than nasopharyngeal swab, presence of viral mutation(s) within the areas targeted by this assay, and inadequate number of viral copies (<131 copies/mL). A negative result must be combined with clinical observations, patient history,  and epidemiological information. The expected result is Negative.  Fact Sheet for Patients:  PinkCheek.be  Fact Sheet for Healthcare Providers:  GravelBags.it  This test is no t yet approved or cleared by the [place] FDA and  has been authorized for detection and/or diagnosis of SARS-CoV-2 by FDA under an Emergency Use Authorization (EUA). This EUA will remain  in effect (meaning this test can be used) for the duration of the COVID-19 declaration under Section 564(b)(1) of the Act, 21 U.S.C. section 360bbb-3(b)(1), unless the authorization is terminated or revoked sooner.  Influenza A by PCR NEGATIVE NEGATIVE Final   Influenza B by PCR NEGATIVE NEGATIVE Final    Comment: (NOTE) The Xpert Xpress SARS-CoV-2/FLU/RSV assay is intended as an aid in  the diagnosis of influenza from Nasopharyngeal swab specimens and  should not be used as a sole basis for treatment. Nasal washings and  aspirates are unacceptable for Xpert Xpress SARS-CoV-2/FLU/RSV  testing.  Fact Sheet for Patients: PinkCheek.be  Fact Sheet for Healthcare Providers: GravelBags.it  This test is not yet approved or cleared by the [place] FDA and  has been authorized for detection and/or diagnosis of SARS-CoV-2 by  FDA under an Emergency Use Authorization (EUA). This EUA will remain  in effect (meaning this test can be used) for the duration of the  Covid-19 declaration under Section 564(b)(1) of the Act, 21  U.S.C. section 360bbb-3(b)(1), unless the authorization is  terminated or revoked.    Respiratory Syncytial Virus by PCR NEGATIVE NEGATIVE Final    Comment: (NOTE) Fact Sheet for Patients: PinkCheek.be  Fact Sheet for Healthcare Providers: GravelBags.it  This test is not yet approved or cleared by the [place] FDA and    has been authorized for detection and/or diagnosis of SARS-CoV-2 by  FDA under an Emergency Use Authorization (EUA). This EUA will remain  in effect (meaning this test can be used) for the duration of the  COVID-19 declaration under Section 564(b)(1) of the Act, 21 U.S.C.  section 360bbb-3(b)(1), unless the authorization is terminated or  revoked. Performed at [facility], [address] [place], [place] [address]          Radiology Studies: CT ANGIO CHEST PE W OR WO CONTRAST  Result Date: 04/14/2020 CLINICAL DATA:  Cough and shortness of breath for 1 week EXAM: CT ANGIOGRAPHY CHEST WITH CONTRAST TECHNIQUE: Multidetector CT imaging of the chest was performed using the standard protocol during bolus administration of intravenous contrast. Multiplanar CT image reconstructions and MIPs were obtained to evaluate the vascular anatomy. CONTRAST:  64mL OMNIPAQUE IOHEXOL 350 MG/ML SOLN COMPARISON:  Chest x-ray from 04/12/2020, CT from 10/21/2014 FINDINGS: Cardiovascular: Thoracic aorta demonstrates atherosclerotic calcifications without aneurysmal dilatation or dissection. No significant cardiac enlargement is noted. Mild coronary calcifications are seen. The pulmonary artery shows a normal branching pattern. No filling defects to suggest pulmonary emboli are noted. Mild motion artifact limits the lower lobe evaluation. Mediastinum/Nodes: Thoracic inlet is within normal limits. No sizable hilar or mediastinal adenopathy is noted. The esophagus as visualized is within normal limits. Lungs/Pleura: Lungs are well aerated bilaterally. Diffuse emphysematous changes are noted. There is a 5 mm nodule in the right upper lobe laterally best seen on image number 44 series 6. Mild bibasilar atelectasis is noted. No sizable effusion is seen. Upper Abdomen: Bilateral adrenal nodules are again identified and stable from the prior exam of 2016 likely representing benign adenomas. The remainder of the upper  abdomen shows no acute abnormality. Right renal cyst is noted. Musculoskeletal: No chest wall abnormality. No acute or significant osseous findings. Review of the MIP images confirms the above findings. IMPRESSION: No evidence of pulmonary emboli. 5 mm nodule in the right upper lobe laterally as described. No follow-up needed if patient is low-risk. Non-contrast chest CT can be considered in 12 months if patient is high-risk. This recommendation follows the consensus statement: Guidelines for Management of Incidental Pulmonary Nodules Detected on CT Images: From the Fleischner Society 2017; Radiology 2017; 284:228-243. Stable bilateral adrenal lesions likely representing small adenomas. Aortic Atherosclerosis (ICD10-I70.0) and  Emphysema (ICD10-J43.9). Electronically Signed   By: Inez Catalina M.D.   On: 04/14/2020 13:26        Scheduled Meds: . amantadine  100 mg Oral BID  . aspirin EC  81 mg Oral Daily  . atenolol  50 mg Oral Daily  . clopidogrel  75 mg Oral Daily  . diltiazem  180 mg Oral Daily  . enoxaparin (LOVENOX) injection  40 mg Subcutaneous Q24H  . gabapentin  100 mg Oral QHS  . hydrochlorothiazide  12.5 mg Oral Daily  . ipratropium-albuterol  3 mL Nebulization Q6H  . melatonin  2.5 mg Oral QHS  . methylPREDNISolone (SOLU-MEDROL) injection  60 mg Intravenous TID  . mirtazapine  30 mg Oral QHS  . mometasone-formoterol  2 puff Inhalation BID  . nicotine  14 mg Transdermal Daily  . pantoprazole  40 mg Oral Daily  . potassium chloride  40 mEq Oral Once  . pravastatin  20 mg Oral q1800  . QUEtiapine  200 mg Oral QHS  . tiotropium  18 mcg Inhalation Daily   Continuous Infusions: . doxycycline (VIBRAMYCIN) IV 100 mg (04/15/20 2157)     LOS: 3 days    Time spent: 30 mins    Wyvonnia Dusky, MD [facility] Pager [phone]  If 7PM-7AM, please contact night-coverage [email or web address] 04/16/2020, 7:29 AM

## 2020-04-16 NOTE — Care Management Important Message (Signed)
Important Message  Patient Details  Name: [name] MRN: [id number] Date of Birth: 1960-11-23   Medicare Important Message Given:  Yes     Dannette Barbara 04/16/2020, 2:51 PM

## 2020-04-17 LAB — BASIC METABOLIC PANEL
Anion gap: 8 (ref 5–15)
BUN: 15 mg/dL (ref 8–23)
CO2: 30 mmol/L (ref 22–32)
Calcium: 8.6 mg/dL — ABNORMAL LOW (ref 8.9–10.3)
Chloride: 99 mmol/L (ref 98–111)
Creatinine, Ser: 0.49 mg/dL — ABNORMAL LOW (ref 0.61–1.24)
GFR calc Af Amer: >60 mL/min (ref >60)
GFR calc non Af Amer: >60 mL/min (ref >60)
Glucose, Bld: 144 mg/dL — ABNORMAL HIGH (ref 70–99)
Potassium: 3.4 mmol/L — ABNORMAL LOW (ref 3.5–5.1)
Sodium: 137 mmol/L (ref 135–145)

## 2020-04-17 LAB — CBC
HCT: 31.9 % — ABNORMAL LOW (ref 39.0–52.0)
Hemoglobin: 11.5 g/dL — ABNORMAL LOW (ref 13.0–17.0)
MCH: 31.2 pg (ref 26.0–34.0)
MCHC: 36.1 g/dL — ABNORMAL HIGH (ref 30.0–36.0)
MCV: 86.4 fL (ref 80.0–100.0)
Platelets: 237 10*3/uL (ref 150–400)
RBC: 3.69 MIL/uL — ABNORMAL LOW (ref 4.22–5.81)
RDW: 13 % (ref 11.5–15.5)
WBC: 11.8 10*3/uL — ABNORMAL HIGH (ref 4.0–10.5)
nRBC: 0 % (ref 0.0–0.2)

## 2020-04-17 MED ORDER — BISACODYL 5 MG PO TBEC: 10.0000 mg | DELAYED_RELEASE_TABLET | Freq: Every day | ORAL | Status: DC | PRN

## 2020-04-17 MED ORDER — POTASSIUM CHLORIDE CRYS ER 20 MEQ PO TBCR
40.0000 meq | EXTENDED_RELEASE_TABLET | Freq: Once | ORAL | Status: AC
  Administered 2020-04-17: 40 meq via ORAL
  Filled 2020-04-17: qty 2

## 2020-04-17 MED ORDER — DOCUSATE SODIUM 100 MG PO CAPS
200.0000 mg | ORAL_CAPSULE | Freq: Two times a day (BID) | ORAL | Status: DC
  Administered 2020-04-17 – 2020-04-20 (×7): 200 mg via ORAL
  Filled 2020-04-17 (×7): qty 2

## 2020-04-17 NOTE — Progress Notes (Signed)
PROGRESS NOTE    [name]  GMW:[id number] DOB: 1961/05/15 DOA: 04/13/2020 PCP: Donnie Coffin, MD   Assessment & Plan:   Principal Problem:   COPD exacerbation ([place]) Active Problems:   Hypokalemia   Essential hypertension   COPD exacerbation: initially required BiPAP but that has since been weaned off. Respiratory status has improved today. Continue on supplemental oxygen and wean as tolerated, currently on 2L [place]. Continue on steroids, bronchodilators, doxycycline. Encourage incentive spirometry. CTA shows no PE, pneumonia or emphysematous changes. Unchanged from previous day   Acute hypoxic respiratory failure: secondary to above. Improving from day prior, only on 2L [place]. Continue on supplemental oxygen and wean as tolerated. Management as stated above  Leukocytosis: possibly secondary to steroid use vs infection. Will continue to monitor   Smoking: nicotine patch to prevent w/drawal. Smoking cessation counseling   HTN: continue on BB, CCB, & HCTZ  HLD: continue on pravastatin   Hypokalemia: potassium given. Will continue to monitor  Schizoaffective disorder: continue on invega, seroquel, amantadine, mirtazapine   Polyneuropathy: continue on gabapentin    DVT prophylaxis: lovenox Code Status: full  Family Communication: Disposition Plan: likely d/c back to group home   Status is: Inpatient  Remains inpatient appropriate because:Inpatient level of care appropriate due to severity of illness currently on HFNC    Dispo: The patient is from: Home              Anticipated d/c is to: Home (back to group home)              Anticipated d/c date is: 3 days              Patient currently is not medically stable to d/c.    Consultants:      Procedures:    Antimicrobials:    Subjective: Pt c/o shortness of breath still but improved from day prior.   Objective: Vitals:   04/16/20 1420 04/16/20 2046 04/16/20 2059 04/17/20 0525  BP:   110/73 120/81  Pulse:    69 70  Resp:   16 20  Temp:   (!) 97.5 F (36.4 C) 97.8 F (36.6 C)  TempSrc:   Oral Oral  SpO2: 96% 96% 99% 97%  Weight:      Height:        Intake/Output Summary (Last 24 hours) at 04/17/2020 0739 Last data filed at 04/17/2020 0540 Gross per 24 hour  Intake 600 ml  Output 1575 ml  Net -975 ml   Filed Weights   04/12/20 1811  Weight: 96.6 kg    Examination: General exam: Appears calm and comfortable  Respiratory system: decreased breath sounds b/l. No rales Cardiovascular system: S1 & S2 +. No  rubs, gallops or clicks.  Gastrointestinal system: Abdomen is obese, soft and nontender. Hypoactive bowel sounds heard. Central nervous system: Alert and oriented. Moves all 4 extremities  Psychiatry: Judgement and insight appear normal. Flat mood and affect      Data Reviewed: I have personally reviewed following labs and imaging studies  CBC: Recent Labs  Lab 04/12/20 1815 04/12/20 1815 04/13/20 0729 04/13/20 0729 04/14/20 0539 04/15/20 0513 04/15/20 1009 04/16/20 0611 04/17/20 0421  WBC 8.6   < > 5.8  --  9.7 13.7*  --  13.1* 11.8*  NEUTROABS 5.4  --   --   --   --   --   --   --   --   HGB 13.5   < > 13.3   < >  12.6* 11.3* 12.0* 11.3* 11.5*  HCT 38.6*   < > 38.9*   < > 36.3* 33.3* 35.5* 31.4* 31.9*  MCV 88.7   < > 89.8  --  89.9 91.0  --  86.5 86.4  PLT 141*   < > 145*  --  180 199  --  204 237   < > = values in this interval not displayed.   Basic Metabolic Panel: Recent Labs  Lab 04/12/20 1815 04/13/20 0147 04/13/20 0729 04/14/20 0539 04/15/20 0513 04/16/20 0611 04/17/20 0421  NA   < >  --  138 135 136 136 137  K   < >  --  3.4* 3.5 3.8 3.3* 3.4*  CL   < >  --  99 98 99 97* 99  CO2   < >  --  28 28 30 30 30   GLUCOSE   < >  --  143* 135* 152* 136* 144*  BUN   < >  --  9 12 12 11 15   CREATININE   < >  --  0.63 0.42* 0.67 0.65 0.49*  CALCIUM   < >  --  8.9 9.0 8.8* 8.7* 8.6*  MG  --  2.1  --   --   --   --   --    < > = values in this interval not  displayed.   GFR: Estimated Creatinine Clearance: 118.8 mL/min (A) (by C-G formula based on SCr of 0.49 mg/dL (L)). Liver Function Tests: No results for input(s): AST, ALT, ALKPHOS, BILITOT, PROT, ALBUMIN in the last 168 hours. No results for input(s): LIPASE, AMYLASE in the last 168 hours. No results for input(s): AMMONIA in the last 168 hours. Coagulation Profile: No results for input(s): INR, PROTIME in the last 168 hours. Cardiac Enzymes: No results for input(s): CKTOTAL, CKMB, CKMBINDEX, TROPONINI in the last 168 hours. BNP (last 3 results) No results for input(s): PROBNP in the last 8760 hours. HbA1C: No results for input(s): HGBA1C in the last 72 hours. CBG: No results for input(s): GLUCAP in the last 168 hours. Lipid Profile: No results for input(s): CHOL, HDL, LDLCALC, TRIG, CHOLHDL, LDLDIRECT in the last 72 hours. Thyroid Function Tests: No results for input(s): TSH, T4TOTAL, FREET4, T3FREE, THYROIDAB in the last 72 hours. Anemia Panel: No results for input(s): VITAMINB12, FOLATE, FERRITIN, TIBC, IRON, RETICCTPCT in the last 72 hours. Sepsis Labs: No results for input(s): PROCALCITON, LATICACIDVEN in the last 168 hours.  Recent Results (from the past 240 hour(s))  Resp Panel by RT PCR (RSV, Flu A&B, Covid) - Nasopharyngeal Swab     Status: None   Collection Time: 04/13/20  1:47 AM   Specimen: Nasopharyngeal Swab  Result Value Ref Range Status   SARS Coronavirus 2 by RT PCR NEGATIVE NEGATIVE Final    Comment: (NOTE) SARS-CoV-2 target nucleic acids are NOT DETECTED.  The SARS-CoV-2 RNA is generally detectable in upper respiratoy specimens during the acute phase of infection. The lowest concentration of SARS-CoV-2 viral copies this assay can detect is 131 copies/mL. A negative result does not preclude SARS-Cov-2 infection and should not be used as the sole basis for treatment or other patient management decisions. A negative result may occur with  improper specimen  collection/handling, submission of specimen other than nasopharyngeal swab, presence of viral mutation(s) within the areas targeted by this assay, and inadequate number of viral copies (<131 copies/mL). A negative result must be combined with clinical observations, patient history, and epidemiological information. The expected  result is Negative.  Fact Sheet for Patients:  PinkCheek.be  Fact Sheet for Healthcare Providers:  GravelBags.it  This test is no t yet approved or cleared by the [place] FDA and  has been authorized for detection and/or diagnosis of SARS-CoV-2 by FDA under an Emergency Use Authorization (EUA). This EUA will remain  in effect (meaning this test can be used) for the duration of the COVID-19 declaration under Section 564(b)(1) of the Act, 21 U.S.C. section 360bbb-3(b)(1), unless the authorization is terminated or revoked sooner.     Influenza A by PCR NEGATIVE NEGATIVE Final   Influenza B by PCR NEGATIVE NEGATIVE Final    Comment: (NOTE) The Xpert Xpress SARS-CoV-2/FLU/RSV assay is intended as an aid in  the diagnosis of influenza from Nasopharyngeal swab specimens and  should not be used as a sole basis for treatment. Nasal washings and  aspirates are unacceptable for Xpert Xpress SARS-CoV-2/FLU/RSV  testing.  Fact Sheet for Patients: PinkCheek.be  Fact Sheet for Healthcare Providers: GravelBags.it  This test is not yet approved or cleared by the [place] FDA and  has been authorized for detection and/or diagnosis of SARS-CoV-2 by  FDA under an Emergency Use Authorization (EUA). This EUA will remain  in effect (meaning this test can be used) for the duration of the  Covid-19 declaration under Section 564(b)(1) of the Act, 21  U.S.C. section 360bbb-3(b)(1), unless the authorization is  terminated or revoked.    Respiratory  Syncytial Virus by PCR NEGATIVE NEGATIVE Final    Comment: (NOTE) Fact Sheet for Patients: PinkCheek.be  Fact Sheet for Healthcare Providers: GravelBags.it  This test is not yet approved or cleared by the [place] FDA and  has been authorized for detection and/or diagnosis of SARS-CoV-2 by  FDA under an Emergency Use Authorization (EUA). This EUA will remain  in effect (meaning this test can be used) for the duration of the  COVID-19 declaration under Section 564(b)(1) of the Act, 21 U.S.C.  section 360bbb-3(b)(1), unless the authorization is terminated or  revoked. Performed at [facility], [address], [place],  [address]          Radiology Studies: No results found.      Scheduled Meds: . amantadine  100 mg Oral BID  . aspirin EC  81 mg Oral Daily  . atenolol  50 mg Oral Daily  . clopidogrel  75 mg Oral Daily  . diltiazem  180 mg Oral Daily  . enoxaparin (LOVENOX) injection  40 mg Subcutaneous Q24H  . gabapentin  100 mg Oral QHS  . hydrochlorothiazide  12.5 mg Oral Daily  . ipratropium-albuterol  3 mL Nebulization Q6H  . melatonin  2.5 mg Oral QHS  . methylPREDNISolone (SOLU-MEDROL) injection  60 mg Intravenous TID  . mirtazapine  30 mg Oral QHS  . mometasone-formoterol  2 puff Inhalation BID  . nicotine  14 mg Transdermal Daily  . pantoprazole  40 mg Oral Daily  . pravastatin  20 mg Oral q1800  . QUEtiapine  200 mg Oral QHS  . tiotropium  18 mcg Inhalation Daily   Continuous Infusions: . doxycycline (VIBRAMYCIN) IV 100 mg (04/16/20 2205)     LOS: 4 days    Time spent: 32 mins    Wyvonnia Dusky, MD [facility] Pager [phone]  If 7PM-7AM, please contact night-coverage [email or web address] 04/17/2020, 7:39 AM

## 2020-04-17 NOTE — Progress Notes (Signed)
Patient seen for svn tx. On room air doing well. States will have rn to call if wants svn tx during the night, asked do not wake

## 2020-04-17 NOTE — Progress Notes (Signed)
Physical Therapy Treatment Patient Details Name: [name] MRN: [id number] DOB: Feb 02, 1961 Today's Date: 04/17/2020    History of Present Illness 59 y.o. male with medical history significant for COPD, HTN, smoking, hiatal hernia, schizophrenia, pneumonia  who presents from his group home with cough.  Patient reports one week history of SOB, cough with productive green sputum, congestion, subjective fevers, and chills.  Is complaining of a lot of congestion in his chest but denies chest pain.    PT Comments    Pt was eager to get up and do some activity and ultimately did quite well with transfers, gait, balance with minimal UE reliance and though he was on HFNC he was able to maintain appropriate vitals t/o the session.  He had no LOB, had good 5xSTS at 17 seconds and only had minimal subjective fatigue.  Overall good session and   Follow Up Recommendations  No PT follow up     Equipment Recommendations  None recommended by PT    Recommendations for Other Services       Precautions / Restrictions Precautions Precautions: Fall (moderate) Restrictions Weight Bearing Restrictions: No    Mobility  Bed Mobility Overal bed mobility: Independent             General bed mobility comments: Pt able to get up to sitting w/ relative ease  Transfers Overall transfer level: Modified independent Equipment used: Rolling walker (2 wheeled);None             General transfer comment: multiple STS efforts today, with and w/o AD, never overly reliant on UEs to stabilize  Ambulation/Gait Ambulation/Gait assistance: Min guard Gait Distance (Feet): 75 Feet Assistive device: Rolling walker (2 wheeled);None       General Gait Details: Gait limited due to HFNC tethering [place] to the wall, however we were able to do multiple loops to the far reach of the tubing and with cuing we managed to stay untangled.  He actually did better (especially given the frequent turns) w/o the walker and  did not display any overt safety/balance issues.  Vitals stable the entire time time O2 in the mid 90s and HR 80s-100s   Stairs             Wheelchair Mobility    Modified Rankin (Stroke Patients Only)       Balance Overall balance assessment: Modified Independent                                          Cognition Arousal/Alertness: Awake/alert Behavior During Therapy: Flat affect Overall Cognitive Status: Within Functional Limits for tasks assessed                                        Exercises Other Exercises Other Exercises: standing at recliner balance exercises including multiple reps of each: eyes closed NBOS, eyes closed with perturbations, tandem ambulation with light HHA, heel raises with HHA, marching in place with HHA    General Comments General comments (skin integrity, edema, etc.): 5x STS = 17 seconds      Pertinent Vitals/Pain Pain Assessment: No/denies pain    Home Living                      Prior Function  PT Goals (current goals can now be found in the care plan section) Progress towards PT goals: Progressing toward goals    Frequency    Min 2X/week      PT Plan Current plan remains appropriate    Co-evaluation              AM-PAC PT "6 Clicks" Mobility   Outcome Measure  Help needed turning from your back to your side while in a flat bed without using bedrails?: None Help needed moving from lying on your back to sitting on the side of a flat bed without using bedrails?: None Help needed moving to and from a bed to a chair (including a wheelchair)?: A Little Help needed standing up from a chair using your arms (e.g., wheelchair or bedside chair)?: A Little Help needed to walk in hospital room?: A Little Help needed climbing 3-5 steps with a railing? : A Little 6 Click Score: 20    End of Session Equipment Utilized During Treatment: Gait belt Activity Tolerance:  Patient tolerated treatment well Patient left: with chair alarm set;with call bell/phone within reach;in chair Nurse Communication: Mobility status PT Visit Diagnosis: Muscle weakness (generalized) (M62.81);Other abnormalities of gait and mobility (R26.89)     Time: 8242-3536 PT Time Calculation (min) (ACUTE ONLY): 31 min  Charges:  $Gait Training: 8-22 mins $Therapeutic Exercise: 8-22 mins                     Kreg Shropshire, DPT 04/17/2020, 12:27 PM

## 2020-04-18 LAB — BASIC METABOLIC PANEL
Anion gap: 9 (ref 5–15)
BUN: 15 mg/dL (ref 8–23)
CO2: 30 mmol/L (ref 22–32)
Calcium: 8.8 mg/dL — ABNORMAL LOW (ref 8.9–10.3)
Chloride: 99 mmol/L (ref 98–111)
Creatinine, Ser: 0.62 mg/dL (ref 0.61–1.24)
GFR calc Af Amer: >60 mL/min (ref >60)
GFR calc non Af Amer: >60 mL/min (ref >60)
Glucose, Bld: 146 mg/dL — ABNORMAL HIGH (ref 70–99)
Potassium: 3.2 mmol/L — ABNORMAL LOW (ref 3.5–5.1)
Sodium: 138 mmol/L (ref 135–145)

## 2020-04-18 LAB — CBC
HCT: 34 % — ABNORMAL LOW (ref 39.0–52.0)
Hemoglobin: 12.1 g/dL — ABNORMAL LOW (ref 13.0–17.0)
MCH: 30.8 pg (ref 26.0–34.0)
MCHC: 35.6 g/dL (ref 30.0–36.0)
MCV: 86.5 fL (ref 80.0–100.0)
Platelets: 251 10*3/uL (ref 150–400)
RBC: 3.93 MIL/uL — ABNORMAL LOW (ref 4.22–5.81)
RDW: 12.8 % (ref 11.5–15.5)
WBC: 12.9 10*3/uL — ABNORMAL HIGH (ref 4.0–10.5)
nRBC: 0.2 % (ref 0.0–0.2)

## 2020-04-18 MED ORDER — METHYLPREDNISOLONE SODIUM SUCC 40 MG IJ SOLR
40.0000 mg | Freq: Two times a day (BID) | INTRAMUSCULAR | Status: DC
  Administered 2020-04-18 – 2020-04-19 (×3): 40 mg via INTRAVENOUS
  Filled 2020-04-18 (×3): qty 1

## 2020-04-18 MED ORDER — POTASSIUM CHLORIDE CRYS ER 20 MEQ PO TBCR
40.0000 meq | EXTENDED_RELEASE_TABLET | Freq: Once | ORAL | Status: AC
  Administered 2020-04-18: 40 meq via ORAL
  Filled 2020-04-18: qty 2

## 2020-04-18 NOTE — Progress Notes (Signed)
PROGRESS NOTE    [name]  XBJ:[id number] DOB: July 26, 1961 DOA: 04/13/2020 PCP: Donnie Coffin, MD   Assessment & Plan:   Principal Problem:   COPD exacerbation ([place]) Active Problems:   Hypokalemia   Essential hypertension   COPD exacerbation: improving . Initially required BiPAP but that has since been weaned off. Continue on supplemental oxygen and wean as tolerated, currently on 2L [place]. Continue on bronchodilators, doxycycline and start steroid taper. Encourage incentive spirometry. CTA shows no PE, pneumonia or emphysematous changes.   Acute hypoxic respiratory failure: secondary to above. Improving from day prior, only on 2L [place]. Continue on supplemental oxygen and wean as tolerated. Management as stated above  Leukocytosis: possibly secondary to steroid use vs infection. Will continue to monitor   Smoking: nicotine patch to prevent w/drawal. Smoking cessation counseling   HTN: continue on atenolol, diltiazem, HCTZ  HLD: continue on statin  Hypokalemia: KCl repleted. Will continue to monitor  Schizoaffective disorder: continue on invega, seroquel, amantadine, mirtazapine   Polyneuropathy: continue on neurontin    DVT prophylaxis: lovenox Code Status: full  Family Communication: Disposition Plan: likely d/c back to group home   Status is: Inpatient  Remains inpatient appropriate because:Inpatient level of care appropriate due to severity of illness currently on HFNC    Dispo: The patient is from: Home              Anticipated d/c is to: Home (back to group home)              Anticipated d/c date is: 1 day               Patient currently is not medically stable to d/c.    Consultants:      Procedures:    Antimicrobials:    Subjective: Pt c/o shortness of breath but improved from day prior.   Objective: Vitals:   04/17/20 1406 04/17/20 2043 04/17/20 2100 04/18/20 0439  BP:  (!) 141/94  128/90  Pulse:  89  80  Resp:  18  18  Temp:  98.3 F  (36.8 C)  97.9 F (36.6 C)  TempSrc:  Oral  Oral  SpO2: 97% 93% 94% 94%  Weight:      Height:        Intake/Output Summary (Last 24 hours) at 04/18/2020 0741 Last data filed at 04/18/2020 0500 Gross per 24 hour  Intake 1353.06 ml  Output 1225 ml  Net 128.06 ml   Filed Weights   04/12/20 1811  Weight: 96.6 kg    Examination:  General exam: Appears calm and comfortable  Respiratory system: decreased breath sounds b/l. No rales, wheezes  Cardiovascular system: S1 & S2 +. No  rubs, gallops or clicks.  Gastrointestinal system: Abdomen is obese, soft and nontender. Hypoactive bowel sounds  Central nervous system: Alert and oriented. Moves all 4 extremities  Psychiatry: Judgement and insight appear normal. Flat mood and affect   Data Reviewed: I have personally reviewed following labs and imaging studies  CBC: Recent Labs  Lab 04/12/20 1815 04/13/20 0729 04/14/20 0539 04/14/20 0539 04/15/20 0513 04/15/20 1009 04/16/20 0611 04/17/20 0421 04/18/20 0504  WBC 8.6   < > 9.7  --  13.7*  --  13.1* 11.8* 12.9*  NEUTROABS 5.4  --   --   --   --   --   --   --   --   HGB 13.5   < > 12.6*   < > 11.3* 12.0* 11.3*  11.5* 12.1*  HCT 38.6*   < > 36.3*   < > 33.3* 35.5* 31.4* 31.9* 34.0*  MCV 88.7   < > 89.9  --  91.0  --  86.5 86.4 86.5  PLT 141*   < > 180  --  199  --  204 237 251   < > = values in this interval not displayed.   Basic Metabolic Panel: Recent Labs  Lab 04/13/20 0147 04/13/20 0729 04/14/20 0539 04/15/20 0513 04/16/20 0611 04/17/20 0421 04/18/20 0504  NA  --    < > 135 136 136 137 138  K  --    < > 3.5 3.8 3.3* 3.4* 3.2*  CL  --    < > 98 99 97* 99 99  CO2  --    < > 28 30 30 30 30   GLUCOSE  --    < > 135* 152* 136* 144* 146*  BUN  --    < > 12 12 11 15 15   CREATININE  --    < > 0.42* 0.67 0.65 0.49* 0.62  CALCIUM  --    < > 9.0 8.8* 8.7* 8.6* 8.8*  MG 2.1  --   --   --   --   --   --    < > = values in this interval not displayed.   GFR: Estimated  Creatinine Clearance: 118.8 mL/min (by C-G formula based on SCr of 0.62 mg/dL). Liver Function Tests: No results for input(s): AST, ALT, ALKPHOS, BILITOT, PROT, ALBUMIN in the last 168 hours. No results for input(s): LIPASE, AMYLASE in the last 168 hours. No results for input(s): AMMONIA in the last 168 hours. Coagulation Profile: No results for input(s): INR, PROTIME in the last 168 hours. Cardiac Enzymes: No results for input(s): CKTOTAL, CKMB, CKMBINDEX, TROPONINI in the last 168 hours. BNP (last 3 results) No results for input(s): PROBNP in the last 8760 hours. HbA1C: No results for input(s): HGBA1C in the last 72 hours. CBG: No results for input(s): GLUCAP in the last 168 hours. Lipid Profile: No results for input(s): CHOL, HDL, LDLCALC, TRIG, CHOLHDL, LDLDIRECT in the last 72 hours. Thyroid Function Tests: No results for input(s): TSH, T4TOTAL, FREET4, T3FREE, THYROIDAB in the last 72 hours. Anemia Panel: No results for input(s): VITAMINB12, FOLATE, FERRITIN, TIBC, IRON, RETICCTPCT in the last 72 hours. Sepsis Labs: No results for input(s): PROCALCITON, LATICACIDVEN in the last 168 hours.  Recent Results (from the past 240 hour(s))  Resp Panel by RT PCR (RSV, Flu A&B, Covid) - Nasopharyngeal Swab     Status: None   Collection Time: 04/13/20  1:47 AM   Specimen: Nasopharyngeal Swab  Result Value Ref Range Status   SARS Coronavirus 2 by RT PCR NEGATIVE NEGATIVE Final    Comment: (NOTE) SARS-CoV-2 target nucleic acids are NOT DETECTED.  The SARS-CoV-2 RNA is generally detectable in upper respiratoy specimens during the acute phase of infection. The lowest concentration of SARS-CoV-2 viral copies this assay can detect is 131 copies/mL. A negative result does not preclude SARS-Cov-2 infection and should not be used as the sole basis for treatment or other patient management decisions. A negative result may occur with  improper specimen collection/handling, submission of  specimen other than nasopharyngeal swab, presence of viral mutation(s) within the areas targeted by this assay, and inadequate number of viral copies (<131 copies/mL). A negative result must be combined with clinical observations, patient history, and epidemiological information. The expected result is Negative.  Fact  Sheet for Patients:  PinkCheek.be  Fact Sheet for Healthcare Providers:  GravelBags.it  This test is no t yet approved or cleared by the [place] FDA and  has been authorized for detection and/or diagnosis of SARS-CoV-2 by FDA under an Emergency Use Authorization (EUA). This EUA will remain  in effect (meaning this test can be used) for the duration of the COVID-19 declaration under Section 564(b)(1) of the Act, 21 U.S.C. section 360bbb-3(b)(1), unless the authorization is terminated or revoked sooner.     Influenza A by PCR NEGATIVE NEGATIVE Final   Influenza B by PCR NEGATIVE NEGATIVE Final    Comment: (NOTE) The Xpert Xpress SARS-CoV-2/FLU/RSV assay is intended as an aid in  the diagnosis of influenza from Nasopharyngeal swab specimens and  should not be used as a sole basis for treatment. Nasal washings and  aspirates are unacceptable for Xpert Xpress SARS-CoV-2/FLU/RSV  testing.  Fact Sheet for Patients: PinkCheek.be  Fact Sheet for Healthcare Providers: GravelBags.it  This test is not yet approved or cleared by the [place] FDA and  has been authorized for detection and/or diagnosis of SARS-CoV-2 by  FDA under an Emergency Use Authorization (EUA). This EUA will remain  in effect (meaning this test can be used) for the duration of the  Covid-19 declaration under Section 564(b)(1) of the Act, 21  U.S.C. section 360bbb-3(b)(1), unless the authorization is  terminated or revoked.    Respiratory Syncytial Virus by PCR NEGATIVE NEGATIVE  Final    Comment: (NOTE) Fact Sheet for Patients: PinkCheek.be  Fact Sheet for Healthcare Providers: GravelBags.it  This test is not yet approved or cleared by the [place] FDA and  has been authorized for detection and/or diagnosis of SARS-CoV-2 by  FDA under an Emergency Use Authorization (EUA). This EUA will remain  in effect (meaning this test can be used) for the duration of the  COVID-19 declaration under Section 564(b)(1) of the Act, 21 U.S.C.  section 360bbb-3(b)(1), unless the authorization is terminated or  revoked. Performed at [facility], [address], [place], [place] [address]          Radiology Studies: No results found.      Scheduled Meds: . amantadine  100 mg Oral BID  . aspirin EC  81 mg Oral Daily  . atenolol  50 mg Oral Daily  . clopidogrel  75 mg Oral Daily  . diltiazem  180 mg Oral Daily  . docusate sodium  200 mg Oral BID  . enoxaparin (LOVENOX) injection  40 mg Subcutaneous Q24H  . gabapentin  100 mg Oral QHS  . hydrochlorothiazide  12.5 mg Oral Daily  . ipratropium-albuterol  3 mL Nebulization Q6H  . melatonin  2.5 mg Oral QHS  . methylPREDNISolone (SOLU-MEDROL) injection  60 mg Intravenous TID  . mirtazapine  30 mg Oral QHS  . mometasone-formoterol  2 puff Inhalation BID  . nicotine  14 mg Transdermal Daily  . pantoprazole  40 mg Oral Daily  . pravastatin  20 mg Oral q1800  . QUEtiapine  200 mg Oral QHS  . tiotropium  18 mcg Inhalation Daily   Continuous Infusions: . doxycycline (VIBRAMYCIN) IV Stopped (04/17/20 2247)     LOS: 5 days    Time spent: 30 mins    Wyvonnia Dusky, MD [facility] Pager [phone]  If 7PM-7AM, please contact night-coverage [email or web address] 04/18/2020, 7:41 AM

## 2020-04-19 ENCOUNTER — Inpatient Hospital Stay: Payer: Medicare Other

## 2020-04-19 DIAGNOSIS — E876 Hypokalemia: Secondary | ICD-10-CM

## 2020-04-19 LAB — BASIC METABOLIC PANEL
Anion gap: 8 (ref 5–15)
BUN: 16 mg/dL (ref 8–23)
CO2: 30 mmol/L (ref 22–32)
Calcium: 8.7 mg/dL — ABNORMAL LOW (ref 8.9–10.3)
Chloride: 99 mmol/L (ref 98–111)
Creatinine, Ser: 0.59 mg/dL — ABNORMAL LOW (ref 0.61–1.24)
GFR calc Af Amer: >60 mL/min (ref >60)
GFR calc non Af Amer: >60 mL/min (ref >60)
Glucose, Bld: 139 mg/dL — ABNORMAL HIGH (ref 70–99)
Potassium: 3.3 mmol/L — ABNORMAL LOW (ref 3.5–5.1)
Sodium: 137 mmol/L (ref 135–145)

## 2020-04-19 LAB — CBC
HCT: 34.5 % — ABNORMAL LOW (ref 39.0–52.0)
Hemoglobin: 12 g/dL — ABNORMAL LOW (ref 13.0–17.0)
MCH: 30.9 pg (ref 26.0–34.0)
MCHC: 34.8 g/dL (ref 30.0–36.0)
MCV: 88.9 fL (ref 80.0–100.0)
Platelets: 256 10*3/uL (ref 150–400)
RBC: 3.88 MIL/uL — ABNORMAL LOW (ref 4.22–5.81)
RDW: 13.2 % (ref 11.5–15.5)
WBC: 14.6 10*3/uL — ABNORMAL HIGH (ref 4.0–10.5)
nRBC: 0.2 % (ref 0.0–0.2)

## 2020-04-19 MED ORDER — PREDNISONE 20 MG PO TABS
40.0000 mg | ORAL_TABLET | Freq: Every day | ORAL | Status: DC
  Administered 2020-04-20: 40 mg via ORAL
  Filled 2020-04-19: qty 2

## 2020-04-19 MED ORDER — IPRATROPIUM-ALBUTEROL 0.5-2.5 (3) MG/3ML IN SOLN: 3.0000 mL | Freq: Two times a day (BID) | RESPIRATORY_TRACT | Status: DC

## 2020-04-19 MED ORDER — PREDNISONE 20 MG PO TABS: 40.0000 mg | ORAL_TABLET | Freq: Every day | ORAL | 0 refills | Status: AC

## 2020-04-19 MED ORDER — POTASSIUM CHLORIDE CRYS ER 20 MEQ PO TBCR
40.0000 meq | EXTENDED_RELEASE_TABLET | Freq: Once | ORAL | Status: AC
  Administered 2020-04-19: 40 meq via ORAL
  Filled 2020-04-19: qty 2

## 2020-04-19 NOTE — Progress Notes (Signed)
No IV needed per doctor.

## 2020-04-19 NOTE — Progress Notes (Signed)
Nurse Tech was helping patient get dressed to go home. Patient walked into the bathroom and on his way back to his chair he got short of breath. Nurse was called. Oxygen was checked and was 86. 2L of oxygen was put on the patient. Patient's 02 came up to 97 after a minute of deep breathing. Doctor was notified. Doctor asked for nurse to walk patient on the floor to see what happened with his oxygen levels. Patient walked about 41ft and then said he felt weak and sat in a chair that was being pushed behind him. During that time 02 did not drop below 94%. Patient got short of breath with a respiration rate of 32. Doctor was there observing and decided to keep patient another night.

## 2020-04-19 NOTE — TOC Progression Note (Signed)
Transition of Care [facility]) - Progression Note    Patient Details  Name: [name] MRN: [id number] Date of Birth: 1961-06-23  Transition of Care [facility]) CM/SW [place], [place] Phone Number: 04/19/2020, 4:57 PM  Clinical Narrative:    patient was prepared for discharge, called [facility] to pick him up, completed Fl2 and placed 2 copies with discharge paperwork, Patient's condition changed and he needs to stay another day. Everything should be ready.    Expected Discharge Plan: Group Home Barriers to Discharge: Continued Medical Work up  Expected Discharge Plan and Services Expected Discharge Plan: Group Home     Post Acute Care Choice: NA Living arrangements for the past 2 months: Group Home Expected Discharge Date: 04/19/20                                     Social Determinants of Health (SDOH) Interventions    Readmission Risk Interventions Readmission Risk Prevention Plan 04/15/2020  Transportation Screening Complete  PCP or Specialist Appt within 3-5 Days Complete  Social Work Consult for [place] Planning/Counseling Complete  Palliative Care Screening Not Applicable  Medication Review Press photographer) Complete  Some recent data might be hidden

## 2020-04-19 NOTE — Progress Notes (Signed)
Patient walked 7ft. Patient's O2 stayed at 94% except of the end when he start coughing. When he start coughing patient's O2 dropped to 91%. After a few seconds it went back up to 94%. Patient did not complain of any shortness of breath.

## 2020-04-19 NOTE — NC FL2 (Signed)
[place] LEVEL OF CARE SCREENING TOOL     IDENTIFICATION  Patient Name: [name] Birthdate: March 18, 1961 Sex: male Admission Date (Current Location): 04/13/2020  [place] and [place] Number:  Engineering geologist and Address:  [facility], [address] [place], [place] [address]      Provider Number: [id number]  Attending Physician Name and Address:  Wyvonnia Dusky, MD  Relative Name and Phone Number:       Current Level of Care: Hospital Recommended Level of Care: [facility] Prior Approval Number:    Date Approved/Denied:   PASRR Number:    Discharge Plan:      Current Diagnoses: Patient Active Problem List   Diagnosis Date Noted  . Hypokalemia 04/13/2020  . Essential hypertension 04/13/2020  . Coagulation disorder ([place]) 12/16/2019  . Pain due to onychomycosis of toenails of both feet 02/14/2019  . COPD exacerbation ([place]) 01/16/2017  . Ventral hernia 12/18/2014  . Hematoma of leg 01/30/2014    Orientation RESPIRATION BLADDER Height & Weight     Self, Place    Continent Weight: 213 lb (96.6 kg) Height:  6\' 1"  (185.4 cm)  BEHAVIORAL SYMPTOMS/MOOD NEUROLOGICAL BOWEL NUTRITION STATUS      Continent    AMBULATORY STATUS COMMUNICATION OF NEEDS Skin   Independent Verbally                         Personal Care Assistance Level of Assistance              Functional Limitations Info             SPECIAL CARE FACTORS FREQUENCY  PT (By licensed PT), OT (By licensed OT)     PT Frequency: 5 x weekly OT Frequency: 5 x weekly            Contractures      Additional Factors Info  Code Status Code Status Info: Full             Current Medications (04/19/2020):  This is the current hospital active medication list Current Facility-Administered Medications  Medication Dose Route Frequency Provider Last Rate Last Admin  . acetaminophen (TYLENOL) tablet 650 mg  650 mg Oral  Q6H PRN Chotiner, Yevonne Aline, MD       Or  . acetaminophen (TYLENOL) suppository 650 mg  650 mg Rectal Q6H PRN Chotiner, Yevonne Aline, MD      . albuterol (PROVENTIL) (2.5 MG/3ML) 0.083% nebulizer solution 2.5 mg  2.5 mg Nebulization Q2H PRN Edwin Dada, MD   2.5 mg at 04/16/20 0418  . alum & mag hydroxide-simeth (MAALOX/MYLANTA) 200-200-20 MG/5ML suspension 30 mL  30 mL Oral Q4H PRN Chotiner, Yevonne Aline, MD   30 mL at 04/14/20 1129  . amantadine (SYMMETREL) capsule 100 mg  100 mg Oral BID Chotiner, Yevonne Aline, MD   100 mg at 04/19/20 0802  . aspirin EC tablet 81 mg  81 mg Oral Daily Chotiner, Yevonne Aline, MD   81 mg at 04/19/20 0804  . atenolol (TENORMIN) tablet 50 mg  50 mg Oral Daily Chotiner, Yevonne Aline, MD   50 mg at 04/19/20 0802  . bisacodyl (DULCOLAX) EC tablet 10 mg  10 mg Oral Daily PRN Wyvonnia Dusky, MD      . clopidogrel (PLAVIX) tablet 75 mg  75 mg Oral Daily Chotiner, Yevonne Aline, MD   75 mg at 04/19/20 0804  .  dextromethorphan-guaiFENesin (ROBITUSSIN-DM) 10-100 MG/5ML liquid 10 mL  10 mL Oral Q6H PRN Chotiner, Yevonne Aline, MD      . diltiazem (CARDIZEM CD) 24 hr capsule 180 mg  180 mg Oral Daily Chotiner, Yevonne Aline, MD   180 mg at 04/19/20 0802  . docusate sodium (COLACE) capsule 200 mg  200 mg Oral BID Wyvonnia Dusky, MD   200 mg at 04/19/20 0804  . doxycycline (VIBRAMYCIN) 100 mg in sodium chloride 0.9 % 250 mL IVPB  100 mg Intravenous Q12H Chotiner, Yevonne Aline, MD 125 mL/hr at 04/19/20 0812 100 mg at 04/19/20 0812  . enoxaparin (LOVENOX) injection 40 mg  40 mg Subcutaneous Q24H Chotiner, Yevonne Aline, MD   40 mg at 04/19/20 6010  . gabapentin (NEURONTIN) capsule 100 mg  100 mg Oral QHS Chotiner, Yevonne Aline, MD   100 mg at 04/18/20 2107  . hydrochlorothiazide (HYDRODIURIL) tablet 12.5 mg  12.5 mg Oral Daily Chotiner, Yevonne Aline, MD   12.5 mg at 04/19/20 0804  . ipratropium-albuterol (DUONEB) 0.5-2.5 (3) MG/3ML nebulizer solution 3 mL  3 mL Nebulization BID Eppie Gibson M, MD       . melatonin tablet 2.5 mg  2.5 mg Oral QHS Lang Snow, NP   2.5 mg at 04/18/20 2107  . methylPREDNISolone sodium succinate (SOLU-MEDROL) 40 mg/mL injection 40 mg  40 mg Intravenous Q12H Wyvonnia Dusky, MD   40 mg at 04/19/20 9323  . mirtazapine (REMERON) tablet 30 mg  30 mg Oral QHS Chotiner, Yevonne Aline, MD   30 mg at 04/18/20 2107  . mometasone-formoterol (DULERA) 200-5 MCG/ACT inhaler 2 puff  2 puff Inhalation BID Chotiner, Yevonne Aline, MD   2 puff at 04/19/20 0814  . nicotine (NICODERM CQ - dosed in mg/24 hours) patch 14 mg  14 mg Transdermal Daily Chotiner, Yevonne Aline, MD   14 mg at 04/19/20 0810  . ondansetron (ZOFRAN) injection 4 mg  4 mg Intravenous Q6H PRN Edwin Dada, MD   4 mg at 04/13/20 1233   Or  . ondansetron (ZOFRAN) tablet 4 mg  4 mg Oral Q8H PRN Danford, Suann Larry, MD      . pantoprazole (PROTONIX) EC tablet 40 mg  40 mg Oral Daily Edwin Dada, MD   40 mg at 04/19/20 0804  . pravastatin (PRAVACHOL) tablet 20 mg  20 mg Oral q1800 Chotiner, Yevonne Aline, MD   20 mg at 04/18/20 1718  . QUEtiapine (SEROQUEL) tablet 200 mg  200 mg Oral QHS Chotiner, Yevonne Aline, MD   200 mg at 04/18/20 2107  . senna-docusate (Senokot-S) tablet 1 tablet  1 tablet Oral QHS PRN Chotiner, Yevonne Aline, MD      . tiotropium (SPIRIVA) inhalation capsule ([facility] use ONLY) 18 mcg  18 mcg Inhalation Daily Chotiner, Yevonne Aline, MD   18 mcg at 04/19/20 5573     Discharge Medications: Please see discharge summary for a list of discharge medications.  Relevant Imaging Results:  Relevant Lab Results:   Additional Information SS# [id number]  Meriel Flavors, LCSW

## 2020-04-19 NOTE — Discharge Summary (Addendum)
Physician Discharge Summary  [name] ZMO:[id number] DOB: 02-Sep-1960 DOA: 04/13/2020  PCP: Donnie Coffin, MD  Admit date: 04/13/2020 Discharge date: 04/20/20  Admitted From: home Disposition: home (group home)  Recommendations for Outpatient Follow-up:  1. Follow up with PCP in 1 week  Home Health: no  Equipment/Devices:  Discharge Condition: stable  CODE STATUS: full  Diet recommendation: Heart Healthy   Brief/Interim Summary: HPI was taken from [name]: [name] is a 59 y.o. male with medical history significant for COPD, HTN, smoking, hiatal hernia, schizophrenia, pneumoniawho presents from his group home with cough. Patient reports one week history of SOB, cough with productive green sputum, congestion, subjective fevers,and chills. Is complaining of a lot of congestion in his chest but denies chest pain. He reports he has had wheezing over the past week and has been using his breathing treatments. He has used tylenol intermittently at the group home. No vomiting or diarrhea. He denies any syncope or urinary frequency or dysuria. Denies abdominal pain. No personal or family history of blood clots, no recent travel immobilization, no leg pain or swelling, no hemoptysis or exogenous hormones. Patient has been vaccinated for Covid. Denies any known sick contact exposures.  ED Course: In the emergency room patient has had tachypnea and has been short of breath.  He was given breathing treatments including a 1 hour continuous albuterol treatment but he continued to have shortness of breath and tachypnea and was placed on BiPAP.  Chest x-ray is negative for infiltrate or consolidation.  White count is normal.  Hospitalist was asked to evaluate and admit for further management of his COPD exacerbation  Hospital Course from [name]. [name]: Pt presented w/ shortness of breath and was found to have COPD exacerbation. Pt was initially treated w/ BiPAP but was weaned  off and pt was then put on HFNC which also has been weaned off prior to d/c. Pt was treated w/ IV abxs, IV steroids, bronchodilators and incentive spirometry. PT/OT saw the pt but did not recommend any follow-up. For more information please see previous progress notes   Discharge Diagnoses:  Principal Problem:   COPD exacerbation ([place]) Active Problems:   Hypokalemia   Essential hypertension  COPD exacerbation: improving . Initially required BiPAP but that has since been weaned off. Weaned off of supplemental oxygen. Continue on bronchodilators, doxycycline and start steroid taper. Encourage incentive spirometry. CTA shows no PE, pneumonia or emphysematous changes.   Acute hypoxic respiratory failure: secondary to above. Weaned off of supplemental oxygen. Resolved   Leukocytosis: possibly secondary to steroid use vs infection. Will continue to monitor   Smoking: nicotine patch to prevent w/drawal. Smoking cessation counseling   HTN: continue on BB, CCB, HCTZ  HLD: continue on statin  Hypokalemia: potassium given. Will continue to monitor  Schizoaffectivedisorder: continue on invega, seroquel, amantadine, mirtazapine   Polyneuropathy: continue on neurontin   Discharge Instructions  Discharge Instructions    Diet - low sodium heart healthy   Complete by: As directed    Discharge instructions   Complete by: As directed    F/u PCP in 1 week   Increase activity slowly   Complete by: As directed      Allergies as of 04/19/2020   No Known Allergies     Medication List    TAKE these medications   acetaminophen 325 MG tablet Commonly known as: TYLENOL Take 650 mg by mouth 2 (two) times daily as needed for mild pain.  amantadine 100 MG capsule Commonly known as: SYMMETREL Take 100 mg by mouth 2 (two) times daily.   aspirin EC 81 MG tablet Take by mouth.   atenolol 50 MG tablet Commonly known as: TENORMIN Take 50 mg by mouth daily.   B-6 100 MG Tabs Take by  mouth daily.   budesonide-formoterol 160-4.5 MCG/ACT inhaler Commonly known as: SYMBICORT Inhale 2 puffs into the lungs 2 (two) times daily.   clopidogrel 75 MG tablet Commonly known as: PLAVIX   diltiazem 180 MG 24 hr capsule Commonly known as: CARDIZEM CD Take 180 mg by mouth daily.   docusate sodium 100 MG capsule Commonly known as: COLACE Take 100 mg by mouth 2 (two) times daily as needed for mild constipation.   Fish Oil 1000 MG Cpdr Take by mouth.   gabapentin 100 MG capsule Commonly known as: NEURONTIN Take 100 mg by mouth at bedtime.   guaiFENesin 600 MG 12 hr tablet Commonly known as: MUCINEX Take 600 mg by mouth 2 (two) times daily.   hydrochlorothiazide 12.5 MG tablet Commonly known as: HYDRODIURIL   Invega Trinza 819 MG/2.625ML injection Generic drug: Paliperidone Palmitate ER Inject 819 mg into the muscle every 3 (three) months.   ipratropium-albuterol 0.5-2.5 (3) MG/3ML Soln Commonly known as: DUONEB Take 3 mLs by nebulization every 6 (six) hours as needed (every 4 to 6 hours as needed for shortnes of breath or wheeziing).   lovastatin 20 MG tablet Commonly known as: MEVACOR Take 20 mg by mouth at bedtime.   melatonin 5 MG Tabs Take 1 tablet by mouth at bedtime as needed.   mirtazapine 30 MG tablet Commonly known as: REMERON Take 30 mg by mouth at bedtime.   potassium chloride SA 20 MEQ tablet Commonly known as: KLOR-CON   predniSONE 20 MG tablet Commonly known as: Deltasone Take 2 tablets (40 mg total) by mouth daily for 5 days.   QUEtiapine 200 MG tablet Commonly known as: SEROQUEL Take 200 mg by mouth at bedtime.   THEREMS PO Take by mouth daily.   tiotropium 18 MCG inhalation capsule Commonly known as: SPIRIVA Place 1 capsule (18 mcg total) into inhaler and inhale daily.   Tussin DM 10-100 MG/5ML liquid Generic drug: dextromethorphan-guaiFENesin Take 10 mLs by mouth every 6 (six) hours as needed for cough.   VITAMIN D  (ERGOCALCIFEROL) PO Take 1,000 Units by mouth daily.       Follow-up Information    Aycock, Ngwe A, MD Follow up.   Specialty: Family Medicine Why: Please schedule hospital follow up appt within 5-7 days of discharge. Contact information: [place] [address] [phone]              No Known Allergies  Consultations:     Procedures/Studies: DG Chest 2 View  Result Date: 04/12/2020 CLINICAL DATA:  Shortness of breath.  Cough and fever.  Wheezing. EXAM: CHEST - 2 VIEW COMPARISON:  Radiograph 01/20/2017, CT 11/02/2011 FINDINGS: Upper normal heart size with normal mediastinal contours. No pulmonary edema. There is mild peribronchial thickening with borderline hyperinflation. Streaky bibasilar opacities appear similar to prior exam and likely represent scarring. There is no evidence of acute or confluent airspace disease. No pleural effusion or pneumothorax. No acute osseous abnormalities are seen. IMPRESSION: Mild peribronchial thickening and borderline hyperinflation, may be related COPD, bronchitis or asthma. Streaky bibasilar opacities appear similar to prior exam and likely represent scarring. Electronically Signed   By: Keith Rake M.D.   On: 04/12/2020  18:36   CT ANGIO CHEST PE W OR WO CONTRAST  Result Date: 04/14/2020 CLINICAL DATA:  Cough and shortness of breath for 1 week EXAM: CT ANGIOGRAPHY CHEST WITH CONTRAST TECHNIQUE: Multidetector CT imaging of the chest was performed using the standard protocol during bolus administration of intravenous contrast. Multiplanar CT image reconstructions and MIPs were obtained to evaluate the vascular anatomy. CONTRAST:  62mL OMNIPAQUE IOHEXOL 350 MG/ML SOLN COMPARISON:  Chest x-ray from 04/12/2020, CT from 10/21/2014 FINDINGS: Cardiovascular: Thoracic aorta demonstrates atherosclerotic calcifications without aneurysmal dilatation or dissection. No significant cardiac enlargement is noted. Mild coronary  calcifications are seen. The pulmonary artery shows a normal branching pattern. No filling defects to suggest pulmonary emboli are noted. Mild motion artifact limits the lower lobe evaluation. Mediastinum/Nodes: Thoracic inlet is within normal limits. No sizable hilar or mediastinal adenopathy is noted. The esophagus as visualized is within normal limits. Lungs/Pleura: Lungs are well aerated bilaterally. Diffuse emphysematous changes are noted. There is a 5 mm nodule in the right upper lobe laterally best seen on image number 44 series 6. Mild bibasilar atelectasis is noted. No sizable effusion is seen. Upper Abdomen: Bilateral adrenal nodules are again identified and stable from the prior exam of 2016 likely representing benign adenomas. The remainder of the upper abdomen shows no acute abnormality. Right renal cyst is noted. Musculoskeletal: No chest wall abnormality. No acute or significant osseous findings. Review of the MIP images confirms the above findings. IMPRESSION: No evidence of pulmonary emboli. 5 mm nodule in the right upper lobe laterally as described. No follow-up needed if patient is low-risk. Non-contrast chest CT can be considered in 12 months if patient is high-risk. This recommendation follows the consensus statement: Guidelines for Management of Incidental Pulmonary Nodules Detected on CT Images: From the Fleischner Society 2017; Radiology 2017; 284:228-243. Stable bilateral adrenal lesions likely representing small adenomas. Aortic Atherosclerosis (ICD10-I70.0) and Emphysema (ICD10-J43.9). Electronically Signed   By: Inez Catalina M.D.   On: 04/14/2020 13:26      Subjective: pt c/o fatigue    Discharge Exam: Vitals:   04/19/20 0701 04/19/20 0800  BP: 109/70 106/89  Pulse: 81 79  Resp: 15   Temp: 98.6 F (37 C)   SpO2: 94%    Vitals:   04/18/20 1508 04/18/20 2135 04/19/20 0701 04/19/20 0800  BP:  126/87 109/70 106/89  Pulse:  70 81 79  Resp:  18 15   Temp:  97.9 F (36.6  C) 98.6 F (37 C)   TempSrc:  Oral Oral   SpO2: 96% 97% 94%   Weight:      Height:        General: Pt is alert, awake, not in acute distress Cardiovascular:  S1/S2 +, no rubs, no gallops Respiratory: diminished breath sounds b/l otherwise clear  Abdominal: Soft, NT, ND, bowel sounds + Extremities: no edema, no cyanosis    The results of significant diagnostics from this hospitalization (including imaging, microbiology, ancillary and laboratory) are listed below for reference.     Microbiology: Recent Results (from the past 240 hour(s))  Resp Panel by RT PCR (RSV, Flu A&B, Covid) - Nasopharyngeal Swab     Status: None   Collection Time: 04/13/20  1:47 AM   Specimen: Nasopharyngeal Swab  Result Value Ref Range Status   SARS Coronavirus 2 by RT PCR NEGATIVE NEGATIVE Final    Comment: (NOTE) SARS-CoV-2 target nucleic acids are NOT DETECTED.  The SARS-CoV-2 RNA is generally detectable in upper respiratoy specimens during the acute  phase of infection. The lowest concentration of SARS-CoV-2 viral copies this assay can detect is 131 copies/mL. A negative result does not preclude SARS-Cov-2 infection and should not be used as the sole basis for treatment or other patient management decisions. A negative result may occur with  improper specimen collection/handling, submission of specimen other than nasopharyngeal swab, presence of viral mutation(s) within the areas targeted by this assay, and inadequate number of viral copies (<131 copies/mL). A negative result must be combined with clinical observations, patient history, and epidemiological information. The expected result is Negative.  Fact Sheet for Patients:  PinkCheek.be  Fact Sheet for Healthcare Providers:  GravelBags.it  This test is no t yet approved or cleared by the [place] FDA and  has been authorized for detection and/or diagnosis of SARS-CoV-2 by FDA  under an Emergency Use Authorization (EUA). This EUA will remain  in effect (meaning this test can be used) for the duration of the COVID-19 declaration under Section 564(b)(1) of the Act, 21 U.S.C. section 360bbb-3(b)(1), unless the authorization is terminated or revoked sooner.     Influenza A by PCR NEGATIVE NEGATIVE Final   Influenza B by PCR NEGATIVE NEGATIVE Final    Comment: (NOTE) The Xpert Xpress SARS-CoV-2/FLU/RSV assay is intended as an aid in  the diagnosis of influenza from Nasopharyngeal swab specimens and  should not be used as a sole basis for treatment. Nasal washings and  aspirates are unacceptable for Xpert Xpress SARS-CoV-2/FLU/RSV  testing.  Fact Sheet for Patients: PinkCheek.be  Fact Sheet for Healthcare Providers: GravelBags.it  This test is not yet approved or cleared by the [place] FDA and  has been authorized for detection and/or diagnosis of SARS-CoV-2 by  FDA under an Emergency Use Authorization (EUA). This EUA will remain  in effect (meaning this test can be used) for the duration of the  Covid-19 declaration under Section 564(b)(1) of the Act, 21  U.S.C. section 360bbb-3(b)(1), unless the authorization is  terminated or revoked.    Respiratory Syncytial Virus by PCR NEGATIVE NEGATIVE Final    Comment: (NOTE) Fact Sheet for Patients: PinkCheek.be  Fact Sheet for Healthcare Providers: GravelBags.it  This test is not yet approved or cleared by the [place] FDA and  has been authorized for detection and/or diagnosis of SARS-CoV-2 by  FDA under an Emergency Use Authorization (EUA). This EUA will remain  in effect (meaning this test can be used) for the duration of the  COVID-19 declaration under Section 564(b)(1) of the Act, 21 U.S.C.  section 360bbb-3(b)(1), unless the authorization is terminated or  revoked. Performed at  [facility], [address] [place], [place] [address]      Labs: BNP (last 3 results) No results for input(s): BNP in the last 8760 hours. Basic Metabolic Panel: Recent Labs  Lab 04/13/20 0147 04/13/20 0729 04/15/20 0513 04/16/20 2376 04/17/20 0421 04/18/20 0504 04/19/20 0522  NA  --    < > 136 136 137 138 137  K  --    < > 3.8 3.3* 3.4* 3.2* 3.3*  CL  --    < > 99 97* 99 99 99  CO2  --    < > 30 30 30 30 30   GLUCOSE  --    < > 152* 136* 144* 146* 139*  BUN  --    < > 12 11 15 15 16   CREATININE  --    < > 0.67 0.65 0.49* 0.62 0.59*  CALCIUM  --    < >  8.8* 8.7* 8.6* 8.8* 8.7*  MG 2.1  --   --   --   --   --   --    < > = values in this interval not displayed.   Liver Function Tests: No results for input(s): AST, ALT, ALKPHOS, BILITOT, PROT, ALBUMIN in the last 168 hours. No results for input(s): LIPASE, AMYLASE in the last 168 hours. No results for input(s): AMMONIA in the last 168 hours. CBC: Recent Labs  Lab 04/12/20 1815 04/13/20 0729 04/15/20 0513 04/15/20 0513 04/15/20 1009 04/16/20 0611 04/17/20 0421 04/18/20 0504 04/19/20 0522  WBC 8.6   < > 13.7*  --   --  13.1* 11.8* 12.9* 14.6*  NEUTROABS 5.4  --   --   --   --   --   --   --   --   HGB 13.5   < > 11.3*   < > 12.0* 11.3* 11.5* 12.1* 12.0*  HCT 38.6*   < > 33.3*   < > 35.5* 31.4* 31.9* 34.0* 34.5*  MCV 88.7   < > 91.0  --   --  86.5 86.4 86.5 88.9  PLT 141*   < > 199  --   --  204 237 251 256   < > = values in this interval not displayed.   Cardiac Enzymes: No results for input(s): CKTOTAL, CKMB, CKMBINDEX, TROPONINI in the last 168 hours. BNP: Invalid input(s): POCBNP CBG: No results for input(s): GLUCAP in the last 168 hours. D-Dimer No results for input(s): DDIMER in the last 72 hours. Hgb A1c No results for input(s): HGBA1C in the last 72 hours. Lipid Profile No results for input(s): CHOL, HDL, LDLCALC, TRIG, CHOLHDL, LDLDIRECT in the last 72 hours. Thyroid function  studies No results for input(s): TSH, T4TOTAL, T3FREE, THYROIDAB in the last 72 hours.  Invalid input(s): FREET3 Anemia work up No results for input(s): VITAMINB12, FOLATE, FERRITIN, TIBC, IRON, RETICCTPCT in the last 72 hours. Urinalysis    Component Value Date/Time   COLORURINE Colorless 12/27/2013 1921   APPEARANCEUR Clear 12/27/2013 1921   LABSPEC 1.002 12/27/2013 1921   PHURINE 6.0 12/27/2013 1921   GLUCOSEU Negative 12/27/2013 1921   HGBUR Negative 12/27/2013 1921   BILIRUBINUR Negative 12/27/2013 1921   KETONESUR Negative 12/27/2013 1921   PROTEINUR Negative 12/27/2013 1921   NITRITE Negative 12/27/2013 1921   LEUKOCYTESUR Negative 12/27/2013 1921   Sepsis Labs Invalid input(s): PROCALCITONIN,  WBC,  LACTICIDVEN Microbiology Recent Results (from the past 240 hour(s))  Resp Panel by RT PCR (RSV, Flu A&B, Covid) - Nasopharyngeal Swab     Status: None   Collection Time: 04/13/20  1:47 AM   Specimen: Nasopharyngeal Swab  Result Value Ref Range Status   SARS Coronavirus 2 by RT PCR NEGATIVE NEGATIVE Final    Comment: (NOTE) SARS-CoV-2 target nucleic acids are NOT DETECTED.  The SARS-CoV-2 RNA is generally detectable in upper respiratoy specimens during the acute phase of infection. The lowest concentration of SARS-CoV-2 viral copies this assay can detect is 131 copies/mL. A negative result does not preclude SARS-Cov-2 infection and should not be used as the sole basis for treatment or other patient management decisions. A negative result may occur with  improper specimen collection/handling, submission of specimen other than nasopharyngeal swab, presence of viral mutation(s) within the areas targeted by this assay, and inadequate number of viral copies (<131 copies/mL). A negative result must be combined with clinical observations, patient history, and epidemiological information. The expected result is  Negative.  Fact Sheet for Patients:   PinkCheek.be  Fact Sheet for Healthcare Providers:  GravelBags.it  This test is no t yet approved or cleared by the [place] FDA and  has been authorized for detection and/or diagnosis of SARS-CoV-2 by FDA under an Emergency Use Authorization (EUA). This EUA will remain  in effect (meaning this test can be used) for the duration of the COVID-19 declaration under Section 564(b)(1) of the Act, 21 U.S.C. section 360bbb-3(b)(1), unless the authorization is terminated or revoked sooner.     Influenza A by PCR NEGATIVE NEGATIVE Final   Influenza B by PCR NEGATIVE NEGATIVE Final    Comment: (NOTE) The Xpert Xpress SARS-CoV-2/FLU/RSV assay is intended as an aid in  the diagnosis of influenza from Nasopharyngeal swab specimens and  should not be used as a sole basis for treatment. Nasal washings and  aspirates are unacceptable for Xpert Xpress SARS-CoV-2/FLU/RSV  testing.  Fact Sheet for Patients: PinkCheek.be  Fact Sheet for Healthcare Providers: GravelBags.it  This test is not yet approved or cleared by the [place] FDA and  has been authorized for detection and/or diagnosis of SARS-CoV-2 by  FDA under an Emergency Use Authorization (EUA). This EUA will remain  in effect (meaning this test can be used) for the duration of the  Covid-19 declaration under Section 564(b)(1) of the Act, 21  U.S.C. section 360bbb-3(b)(1), unless the authorization is  terminated or revoked.    Respiratory Syncytial Virus by PCR NEGATIVE NEGATIVE Final    Comment: (NOTE) Fact Sheet for Patients: PinkCheek.be  Fact Sheet for Healthcare Providers: GravelBags.it  This test is not yet approved or cleared by the [place] FDA and  has been authorized for detection and/or diagnosis of SARS-CoV-2 by  FDA under an Emergency  Use Authorization (EUA). This EUA will remain  in effect (meaning this test can be used) for the duration of the  COVID-19 declaration under Section 564(b)(1) of the Act, 21 U.S.C.  section 360bbb-3(b)(1), unless the authorization is terminated or  revoked. Performed at [facility], [address], [place], [place] [address]      Time coordinating discharge: Over 30 minutes  SIGNED:   Wyvonnia Dusky, MD  [facility] 04/19/2020, 11:31 AM Pager   If 7PM-7AM, please contact night-coverage [email or web address]

## 2020-04-19 NOTE — TOC Transition Note (Signed)
Transition of Care [facility]) - CM/SW Discharge Note   Patient Details  Name: [name] MRN: [id number] Date of Birth: [birth year]  [facility] CM/SW Contact:  Meriel Flavors, LCSW Phone Number: 04/19/2020, 11:15 AM   Clinical Narrative:    CSW called [facility] to notify of patient's discharge today. CSW spoke with Tammy @ [phone] . Patient needs to be ready at 2:00 pm for pick up, as per group home providing transportation     Barriers to Discharge: Continued Medical Work up   Patient Goals and CMS Choice     Choice offered to / list presented to : NA  Discharge Placement                       Discharge Plan and Services     Post Acute Care Choice: NA                               Social Determinants of Health (SDOH) Interventions     Readmission Risk Interventions Readmission Risk Prevention Plan 04/15/2020  Transportation Screening Complete  PCP or Specialist Appt within 3-5 Days Complete  Social Work Consult for [place] Planning/Counseling [place] Screening Not Applicable  Medication Review [employer]) Complete  Some recent data might be hidden

## 2020-04-20 ENCOUNTER — Ambulatory Visit: Payer: Medicare Other | Admitting: Podiatry

## 2020-04-20 LAB — CBC
HCT: 33.9 % — ABNORMAL LOW (ref 39.0–52.0)
Hemoglobin: 12 g/dL — ABNORMAL LOW (ref 13.0–17.0)
MCH: 30.9 pg (ref 26.0–34.0)
MCHC: 35.4 g/dL (ref 30.0–36.0)
MCV: 87.4 fL (ref 80.0–100.0)
Platelets: 258 10*3/uL (ref 150–400)
RBC: 3.88 MIL/uL — ABNORMAL LOW (ref 4.22–5.81)
RDW: 13.5 % (ref 11.5–15.5)
WBC: 14 10*3/uL — ABNORMAL HIGH (ref 4.0–10.5)
nRBC: 0 % (ref 0.0–0.2)

## 2020-04-20 LAB — BASIC METABOLIC PANEL
Anion gap: 8 (ref 5–15)
BUN: 15 mg/dL (ref 8–23)
CO2: 31 mmol/L (ref 22–32)
Calcium: 8.6 mg/dL — ABNORMAL LOW (ref 8.9–10.3)
Chloride: 101 mmol/L (ref 98–111)
Creatinine, Ser: 0.47 mg/dL — ABNORMAL LOW (ref 0.61–1.24)
GFR calc Af Amer: >60 mL/min (ref >60)
GFR calc non Af Amer: >60 mL/min (ref >60)
Glucose, Bld: 105 mg/dL — ABNORMAL HIGH (ref 70–99)
Potassium: 3.1 mmol/L — ABNORMAL LOW (ref 3.5–5.1)
Sodium: 140 mmol/L (ref 135–145)

## 2020-04-20 NOTE — NC FL2 (Signed)
[place] LEVEL OF CARE SCREENING TOOL     IDENTIFICATION  Patient Name: [name] Birthdate: [birth year] Sex: male Admission Date (Current Location): 04/13/2020  [place] and [place] Number:  Engineering geologist and Address:  [facility], [address], [place], [place] [address]      Provider Number: [id number]  Attending Physician Name and Address:  Wyvonnia Dusky, MD  Relative Name and Phone Number:       Current Level of Care: Hospital Recommended Level of Care: Other (Comment) (Group home) Prior Approval Number:    Date Approved/Denied:   PASRR Number:    Discharge Plan: Other (Comment) (Group home)    Current Diagnoses: Patient Active Problem List   Diagnosis Date Noted  . Hypokalemia 04/13/2020  . Essential hypertension 04/13/2020  . Coagulation disorder ([place]) 12/16/2019  . Pain due to onychomycosis of toenails of both feet 02/14/2019  . COPD exacerbation ([place]) 01/16/2017  . Ventral hernia 12/18/2014  . Hematoma of leg 01/30/2014    Orientation RESPIRATION BLADDER Height & Weight     Self, Time, Situation, Place  Normal Continent Weight: 213 lb (96.6 kg) Height:  6\' 1"  (185.4 cm)  BEHAVIORAL SYMPTOMS/MOOD NEUROLOGICAL BOWEL NUTRITION STATUS   (None)  (None) Continent Diet (Heart healthy)  AMBULATORY STATUS COMMUNICATION OF NEEDS Skin   Limited Assist Verbally Normal                       Personal Care Assistance Level of Assistance              Functional Limitations Info  Sight, Hearing, Speech Sight Info: Adequate Hearing Info: Adequate Speech Info: Adequate    SPECIAL CARE FACTORS FREQUENCY        Contractures Contractures Info: Not present    Additional Factors Info  Code Status, Allergies Code Status Info: Full code Allergies Info: NKDA           Current Medications (04/20/2020):  This is the current hospital active medication list Current Facility-Administered Medications   Medication Dose Route Frequency Provider Last Rate Last Admin  . acetaminophen (TYLENOL) tablet 650 mg  650 mg Oral Q6H PRN Chotiner, Yevonne Aline, MD       Or  . acetaminophen (TYLENOL) suppository 650 mg  650 mg Rectal Q6H PRN Chotiner, Yevonne Aline, MD      . albuterol (PROVENTIL) (2.5 MG/3ML) 0.083% nebulizer solution 2.5 mg  2.5 mg Nebulization Q2H PRN Edwin Dada, MD   2.5 mg at 04/16/20 0418  . alum & mag hydroxide-simeth (MAALOX/MYLANTA) 200-200-20 MG/5ML suspension 30 mL  30 mL Oral Q4H PRN Chotiner, Yevonne Aline, MD   30 mL at 04/14/20 1129  . amantadine (SYMMETREL) capsule 100 mg  100 mg Oral BID Chotiner, Yevonne Aline, MD   100 mg at 04/20/20 0829  . aspirin EC tablet 81 mg  81 mg Oral Daily Chotiner, Yevonne Aline, MD   81 mg at 04/20/20 0829  . atenolol (TENORMIN) tablet 50 mg  50 mg Oral Daily Chotiner, Yevonne Aline, MD   50 mg at 04/20/20 0830  . bisacodyl (DULCOLAX) EC tablet 10 mg  10 mg Oral Daily PRN Wyvonnia Dusky, MD      . clopidogrel (PLAVIX) tablet 75 mg  75 mg Oral Daily Chotiner, Yevonne Aline, MD   75 mg at 04/20/20 0830  . dextromethorphan-guaiFENesin (ROBITUSSIN-DM) 10-100 MG/5ML liquid 10 mL  10 mL Oral Q6H PRN Chotiner, Yevonne Aline,  MD      . diltiazem (CARDIZEM CD) 24 hr capsule 180 mg  180 mg Oral Daily Chotiner, Yevonne Aline, MD   180 mg at 04/20/20 0829  . docusate sodium (COLACE) capsule 200 mg  200 mg Oral BID Wyvonnia Dusky, MD   200 mg at 04/20/20 0830  . doxycycline (VIBRAMYCIN) 100 mg in sodium chloride 0.9 % 250 mL IVPB  100 mg Intravenous Q12H Chotiner, Yevonne Aline, MD   Stopped at 04/19/20 1012  . enoxaparin (LOVENOX) injection 40 mg  40 mg Subcutaneous Q24H Chotiner, Yevonne Aline, MD   40 mg at 04/20/20 0828  . gabapentin (NEURONTIN) capsule 100 mg  100 mg Oral QHS Chotiner, Yevonne Aline, MD   100 mg at 04/19/20 2108  . hydrochlorothiazide (HYDRODIURIL) tablet 12.5 mg  12.5 mg Oral Daily Chotiner, Yevonne Aline, MD   12.5 mg at 04/20/20 0829  . melatonin tablet 2.5 mg   2.5 mg Oral QHS Lang Snow, NP   2.5 mg at 04/19/20 2109  . mirtazapine (REMERON) tablet 30 mg  30 mg Oral QHS Chotiner, Yevonne Aline, MD   30 mg at 04/19/20 2109  . mometasone-formoterol (DULERA) 200-5 MCG/ACT inhaler 2 puff  2 puff Inhalation BID Chotiner, Yevonne Aline, MD   2 puff at 04/20/20 0831  . nicotine (NICODERM CQ - dosed in mg/24 hours) patch 14 mg  14 mg Transdermal Daily Chotiner, Yevonne Aline, MD   14 mg at 04/19/20 0810  . ondansetron (ZOFRAN) injection 4 mg  4 mg Intravenous Q6H PRN Edwin Dada, MD   4 mg at 04/13/20 1233   Or  . ondansetron (ZOFRAN) tablet 4 mg  4 mg Oral Q8H PRN Danford, Suann Larry, MD      . pantoprazole (PROTONIX) EC tablet 40 mg  40 mg Oral Daily Danford, Suann Larry, MD   40 mg at 04/20/20 0830  . pravastatin (PRAVACHOL) tablet 20 mg  20 mg Oral q1800 Chotiner, Yevonne Aline, MD   20 mg at 04/19/20 1735  . predniSONE (DELTASONE) tablet 40 mg  40 mg Oral Q breakfast Wyvonnia Dusky, MD   40 mg at 04/20/20 0830  . QUEtiapine (SEROQUEL) tablet 200 mg  200 mg Oral QHS Chotiner, Yevonne Aline, MD   200 mg at 04/19/20 2108  . senna-docusate (Senokot-S) tablet 1 tablet  1 tablet Oral QHS PRN Chotiner, Yevonne Aline, MD      . tiotropium (SPIRIVA) inhalation capsule ([facility] use ONLY) 18 mcg  18 mcg Inhalation Daily Chotiner, Yevonne Aline, MD   18 mcg at 04/20/20 0831     Discharge Medications: TAKE these medications   acetaminophen 325 MG tablet Commonly known as: TYLENOL Take 650 mg by mouth 2 (two) times daily as needed for mild pain.   amantadine 100 MG capsule Commonly known as: SYMMETREL Take 100 mg by mouth 2 (two) times daily.   aspirin EC 81 MG tablet Take by mouth.   atenolol 50 MG tablet Commonly known as: TENORMIN Take 50 mg by mouth daily.   B-6 100 MG Tabs Take by mouth daily.   budesonide-formoterol 160-4.5 MCG/ACT inhaler Commonly known as: SYMBICORT Inhale 2 puffs into the lungs 2 (two) times daily.   clopidogrel 75  MG tablet Commonly known as: PLAVIX   diltiazem 180 MG 24 hr capsule Commonly known as: CARDIZEM CD Take 180 mg by mouth daily.   docusate sodium 100 MG capsule Commonly known as: COLACE Take 100 mg by mouth 2 (two) times  daily as needed for mild constipation.   Fish Oil 1000 MG Cpdr Take by mouth.   gabapentin 100 MG capsule Commonly known as: NEURONTIN Take 100 mg by mouth at bedtime.   guaiFENesin 600 MG 12 hr tablet Commonly known as: MUCINEX Take 600 mg by mouth 2 (two) times daily.   hydrochlorothiazide 12.5 MG tablet Commonly known as: HYDRODIURIL   Invega Trinza 819 MG/2.625ML injection Generic drug: Paliperidone Palmitate ER Inject 819 mg into the muscle every 3 (three) months.   ipratropium-albuterol 0.5-2.5 (3) MG/3ML Soln Commonly known as: DUONEB Take 3 mLs by nebulization every 6 (six) hours as needed (every 4 to 6 hours as needed for shortnes of breath or wheeziing).   lovastatin 20 MG tablet Commonly known as: MEVACOR Take 20 mg by mouth at bedtime.   melatonin 5 MG Tabs Take 1 tablet by mouth at bedtime as needed.   mirtazapine 30 MG tablet Commonly known as: REMERON Take 30 mg by mouth at bedtime.   potassium chloride SA 20 MEQ tablet Commonly known as: KLOR-CON   predniSONE 20 MG tablet Commonly known as: Deltasone Take 2 tablets (40 mg total) by mouth daily for 5 days.   QUEtiapine 200 MG tablet Commonly known as: SEROQUEL Take 200 mg by mouth at bedtime.   THEREMS PO Take by mouth daily.   tiotropium 18 MCG inhalation capsule Commonly known as: SPIRIVA Place 1 capsule (18 mcg total) into inhaler and inhale daily.   Tussin DM 10-100 MG/5ML liquid Generic drug: dextromethorphan-guaiFENesin Take 10 mLs by mouth every 6 (six) hours as needed for cough.   VITAMIN D (ERGOCALCIFEROL) PO Take 1,000 Units by mouth daily.     Relevant Imaging Results:  Relevant Lab Results:   Additional Information SS#:  [id number]  Candie Chroman, LCSW

## 2020-04-20 NOTE — Plan of Care (Signed)
Discharge order received. Patient mental status is at baseline. Vital signs stable . No signs of acute distress. Discharge instructions given group home staff. Patient verbalized understanding. No other issues noted at this time.

## 2020-04-20 NOTE — Care Management Important Message (Signed)
Important Message  Patient Details  Name: [name] MRN: [id number] Date of Birth: [birth year]   Medicare Important Message Given:  Yes     Dannette Barbara 04/20/2020, 2:12 PM

## 2020-04-20 NOTE — Progress Notes (Signed)
Pt has been refusing SVN tx's and requested that they be prn only.

## 2020-04-20 NOTE — TOC Transition Note (Signed)
Transition of Care [facility]) - CM/SW Discharge Note   Patient Details  Name: [name] MRN: [id number] Date of Birth: [birth year]  [facility] CM/SW Contact:  Candie Chroman, LCSW Phone Number: 04/20/2020, 1:10 PM   Clinical Narrative:  Patient has orders to discharge back to his group home today. CSW spoke with staff member. They will have someone pick him up around 3:00 or later. RN aware. Will put FL2 and discharge summary in discharge packet to send with him. No further concerns. CSW signing off.   Final next level of care: Group Home Barriers to Discharge: Barriers Resolved   Patient Goals and CMS Choice     Choice offered to / list presented to : NA  Discharge Placement                Patient to be transferred to facility by: Group home staff member will pick him up around 3:00 or after.   Patient and family notified of of transfer: 04/20/20  Discharge Plan and Services     Post Acute Care Choice: NA                               Social Determinants of Health (SDOH) Interventions     Readmission Risk Interventions Readmission Risk Prevention Plan 04/15/2020  Transportation Screening Complete  PCP or Specialist Appt within 3-5 Days Complete  Social Work Consult for [place] Planning/Counseling [place] Not Applicable  Medication Review Press photographer) Complete  Some recent data might be hidden

## 2020-04-27 ENCOUNTER — Ambulatory Visit (INDEPENDENT_AMBULATORY_CARE_PROVIDER_SITE_OTHER): Payer: Medicare Other | Admitting: Podiatry

## 2020-04-27 ENCOUNTER — Encounter: Payer: Self-pay | Admitting: Podiatry

## 2020-04-27 ENCOUNTER — Other Ambulatory Visit: Payer: Self-pay

## 2020-04-27 DIAGNOSIS — D689 Coagulation defect, unspecified: Secondary | ICD-10-CM | POA: Diagnosis not present

## 2020-04-27 DIAGNOSIS — M79674 Pain in right toe(s): Secondary | ICD-10-CM | POA: Diagnosis not present

## 2020-04-27 DIAGNOSIS — B351 Tinea unguium: Secondary | ICD-10-CM | POA: Diagnosis not present

## 2020-04-27 DIAGNOSIS — M79675 Pain in left toe(s): Secondary | ICD-10-CM

## 2020-04-27 NOTE — Progress Notes (Signed)
This patient returns to my office for at risk foot care.  This patient requires this care by a professional since this patient will be at risk due to having coagulation defect due to plavix.  This patient is unable to cut nails himself since the patient cannot reach his nails.These nails are painful walking and wearing shoes. Patient has burning left heel.  This patient presents for at risk foot care today.  General Appearance  Alert, conversant and in no acute stress.  Vascular  Dorsalis pedis and posterior tibial  pulses are palpable  bilaterally.  Capillary return is within normal limits  bilaterally. Temperature is within normal limits  bilaterally.  Neurologic  Senn-Weinstein monofilament wire test within normal limits  bilaterally. Muscle power within normal limits bilaterally.  Nails Thick disfigured discolored nails with subungual debris  from hallux to fifth toes bilaterally. No evidence of bacterial infection or drainage bilaterally.  Orthopedic  No limitations of motion  feet .  No crepitus or effusions noted.  No bony pathology or digital deformities noted.  Skin  normotropic skin with no porokeratosis noted bilaterally.  No signs of infections or ulcers noted.   Peeling skin left heel.  Onychomycosis  Pain in right toes  Pain in left toes  Consent was obtained for treatment procedures.   Mechanical debridement of nails 1-5  bilaterally performed with a nail nipper.  Filed with dremel without incident.  Patient was told to apply vaseline to left heel.   Return office visit    3 months                 Told patient to return for periodic foot care and evaluation due to potential at risk complications.   Gardiner Barefoot DPM

## 2020-07-27 ENCOUNTER — Ambulatory Visit (INDEPENDENT_AMBULATORY_CARE_PROVIDER_SITE_OTHER): Payer: Medicare Other | Admitting: Podiatry

## 2020-07-27 ENCOUNTER — Encounter: Payer: Self-pay | Admitting: Podiatry

## 2020-07-27 ENCOUNTER — Other Ambulatory Visit: Payer: Self-pay

## 2020-07-27 DIAGNOSIS — M79675 Pain in left toe(s): Secondary | ICD-10-CM | POA: Diagnosis not present

## 2020-07-27 DIAGNOSIS — D689 Coagulation defect, unspecified: Secondary | ICD-10-CM

## 2020-07-27 DIAGNOSIS — M79674 Pain in right toe(s): Secondary | ICD-10-CM | POA: Diagnosis not present

## 2020-07-27 DIAGNOSIS — B351 Tinea unguium: Secondary | ICD-10-CM | POA: Diagnosis not present

## 2020-07-27 NOTE — Progress Notes (Signed)
This patient returns to my office for at risk foot care.  This patient requires this care by a professional since this patient will be at risk due to having coagulation defect due to plavix.  This patient is unable to cut nails himself since the patient cannot reach his nails.These nails are painful walking and wearing shoes. Patient has burning left heel.  This patient presents for at risk foot care today.  General Appearance  Alert, conversant and in no acute stress.  Vascular  Dorsalis pedis and posterior tibial  pulses are palpable  bilaterally.  Capillary return is within normal limits  bilaterally. Temperature is within normal limits  bilaterally.  Neurologic  Senn-Weinstein monofilament wire test within normal limits  bilaterally. Muscle power within normal limits bilaterally.  Nails Thick disfigured discolored nails with subungual debris  from hallux to fifth toes bilaterally. No evidence of bacterial infection or drainage bilaterally.  Orthopedic  No limitations of motion  feet .  No crepitus or effusions noted.  No bony pathology or digital deformities noted.  Skin  normotropic skin with no porokeratosis noted bilaterally.  No signs of infections or ulcers noted.     Onychomycosis  Pain in right toes  Pain in left toes  Consent was obtained for treatment procedures.   Mechanical debridement of nails 1-5  bilaterally performed with a nail nipper.  Filed with dremel without incident.     Return office visit    3 months                 Told patient to return for periodic foot care and evaluation due to potential at risk complications.   Gardiner Barefoot DPM

## 2020-09-14 ENCOUNTER — Encounter: Payer: Self-pay | Admitting: Family Medicine

## 2020-09-23 ENCOUNTER — Telehealth: Payer: Self-pay | Admitting: *Deleted

## 2020-09-23 DIAGNOSIS — Z122 Encounter for screening for malignant neoplasm of respiratory organs: Secondary | ICD-10-CM

## 2020-09-23 DIAGNOSIS — Z87891 Personal history of nicotine dependence: Secondary | ICD-10-CM

## 2020-09-23 DIAGNOSIS — F17200 Nicotine dependence, unspecified, uncomplicated: Secondary | ICD-10-CM

## 2020-09-23 NOTE — Telephone Encounter (Signed)
Received referral for initial lung cancer screening scan. Contacted patient and obtained smoking history,(current, 45 pack year) as well as answering questions related to screening process. Patient denies signs of lung cancer such as weight loss or hemoptysis. Patient denies comorbidity that would prevent curative treatment if lung cancer were found. Patient is scheduled for shared decision making visit and CT scan on 10/06/20 at 130pm.

## 2020-10-06 ENCOUNTER — Inpatient Hospital Stay: Payer: Medicare Other | Attending: Nurse Practitioner | Admitting: Nurse Practitioner

## 2020-10-06 ENCOUNTER — Other Ambulatory Visit: Payer: Self-pay

## 2020-10-06 ENCOUNTER — Ambulatory Visit
Admission: RE | Admit: 2020-10-06 | Discharge: 2020-10-06 | Disposition: A | Discharge status: Home / Self Care | Payer: Medicare Other | Source: Ambulatory Visit | Attending: Nurse Practitioner | Admitting: Nurse Practitioner

## 2020-10-06 DIAGNOSIS — F17200 Nicotine dependence, unspecified, uncomplicated: Secondary | ICD-10-CM | POA: Insufficient documentation

## 2020-10-06 DIAGNOSIS — Z87891 Personal history of nicotine dependence: Secondary | ICD-10-CM

## 2020-10-06 DIAGNOSIS — Z122 Encounter for screening for malignant neoplasm of respiratory organs: Secondary | ICD-10-CM | POA: Diagnosis present

## 2020-10-06 DIAGNOSIS — F17210 Nicotine dependence, cigarettes, uncomplicated: Secondary | ICD-10-CM

## 2020-10-06 NOTE — Progress Notes (Signed)
Virtual Visit via Video Enabled Telemedicine Note   I connected with [name] on 10/06/20 at 1:30 PM EST by video enabled telemedicine visit and verified that I am speaking with the correct person using two identifiers.   I discussed the limitations, risks, security and privacy concerns of performing an evaluation and management service by telemedicine and the availability of in-person appointments. I also discussed with the patient that there may be a patient responsible charge related to this service. The patient expressed understanding and agreed to proceed.   Other persons participating in the visit and their role in the encounter: Burgess Estelle, RN- checking in patient & navigation  Patient's location: [place]  Provider's location: Clinic  Chief Complaint: Low Dose CT Screening  Patient agreed to evaluation by telemedicine to discuss shared decision making for consideration of low dose CT lung cancer screening.    In accordance with CMS guidelines, patient has met eligibility criteria including age, absence of signs or symptoms of lung cancer.  Social History   Tobacco Use  . Smoking status: Current Every Day Smoker    Packs/day: 1.00    Years: 45.00    Pack years: 45.00    Types: Cigarettes  . Smokeless tobacco: Never Used  Substance Use Topics  . Alcohol use: Not Currently    Alcohol/week: 75.0 standard drinks    Types: 75 Cans of beer per week     A shared decision-making session was conducted prior to the performance of CT scan. This includes one or more decision aids, includes benefits and harms of screening, follow-up diagnostic testing, over-diagnosis, false positive rate, and total radiation exposure.   Counseling on the importance of adherence to annual lung cancer LDCT screening, impact of co-morbidities, and ability or willingness to undergo diagnosis and treatment is imperative for compliance of the program.   Counseling on the importance of continued  smoking cessation for former smokers; the importance of smoking cessation for current smokers, and information about tobacco cessation interventions have been given to patient including [place] and [employer] [place] programs.   Written order for lung cancer screening with LDCT has been given to the patient and any and all questions have been answered to the best of my abilities.    Yearly follow up will be coordinated by Burgess Estelle, Thoracic Navigator.  I discussed the assessment and treatment plan with the patient. The patient was provided an opportunity to ask questions and all were answered. The patient agreed with the plan and demonstrated an understanding of the instructions.   The patient was advised to call back or seek an in-person evaluation if the symptoms worsen or if the condition fails to improve as anticipated.   I provided 15 minutes of face-to-face video visit time dedicated to the care of this patient on the date of this encounter to include pre-visit review of smoking history, face-to-face time with the patient, and post visit ordering of testing/documentation.   Beckey Rutter, DNP, AGNP-C [place] [facility] [phone] (clinic)

## 2020-10-15 ENCOUNTER — Encounter: Payer: Self-pay | Admitting: *Deleted

## 2020-10-29 ENCOUNTER — Ambulatory Visit: Payer: Medicare Other | Admitting: Podiatry

## 2020-12-01 ENCOUNTER — Emergency Department: Payer: No Typology Code available for payment source

## 2020-12-01 ENCOUNTER — Emergency Department
Admission: EM | Admit: 2020-12-01 | Discharge: 2020-12-01 | Disposition: A | Discharge status: Home / Self Care | Payer: No Typology Code available for payment source | Attending: Emergency Medicine | Admitting: Emergency Medicine

## 2020-12-01 ENCOUNTER — Other Ambulatory Visit: Payer: Self-pay

## 2020-12-01 ENCOUNTER — Encounter: Payer: Self-pay | Admitting: Emergency Medicine

## 2020-12-01 DIAGNOSIS — Z7982 Long term (current) use of aspirin: Secondary | ICD-10-CM | POA: Diagnosis not present

## 2020-12-01 DIAGNOSIS — Y92410 Unspecified street and highway as the place of occurrence of the external cause: Secondary | ICD-10-CM | POA: Insufficient documentation

## 2020-12-01 DIAGNOSIS — J449 Chronic obstructive pulmonary disease, unspecified: Secondary | ICD-10-CM | POA: Diagnosis not present

## 2020-12-01 DIAGNOSIS — S8011XA Contusion of right lower leg, initial encounter: Secondary | ICD-10-CM | POA: Insufficient documentation

## 2020-12-01 DIAGNOSIS — Z79899 Other long term (current) drug therapy: Secondary | ICD-10-CM | POA: Diagnosis not present

## 2020-12-01 DIAGNOSIS — F17210 Nicotine dependence, cigarettes, uncomplicated: Secondary | ICD-10-CM | POA: Insufficient documentation

## 2020-12-01 DIAGNOSIS — S5011XA Contusion of right forearm, initial encounter: Secondary | ICD-10-CM | POA: Insufficient documentation

## 2020-12-01 DIAGNOSIS — M25511 Pain in right shoulder: Secondary | ICD-10-CM | POA: Insufficient documentation

## 2020-12-01 DIAGNOSIS — S59911A Unspecified injury of right forearm, initial encounter: Secondary | ICD-10-CM | POA: Diagnosis present

## 2020-12-01 DIAGNOSIS — I10 Essential (primary) hypertension: Secondary | ICD-10-CM | POA: Diagnosis not present

## 2020-12-01 DIAGNOSIS — M7918 Myalgia, other site: Secondary | ICD-10-CM

## 2020-12-01 DIAGNOSIS — S8012XA Contusion of left lower leg, initial encounter: Secondary | ICD-10-CM | POA: Diagnosis not present

## 2020-12-01 HISTORY — DX: Umbilical hernia without obstruction or gangrene: K42.9

## 2020-12-01 MED ORDER — IBUPROFEN 800 MG PO TABS: 800.0000 mg | ORAL_TABLET | Freq: Three times a day (TID) | ORAL | 0 refills | Status: DC | PRN

## 2020-12-01 NOTE — Discharge Instructions (Signed)
Please use heating pads over the right forearm and both shins
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
## 2021-02-25 ENCOUNTER — Other Ambulatory Visit: Payer: Self-pay

## 2021-02-25 ENCOUNTER — Ambulatory Visit (INDEPENDENT_AMBULATORY_CARE_PROVIDER_SITE_OTHER): Payer: Medicare Other | Admitting: Podiatry

## 2021-02-25 ENCOUNTER — Encounter: Payer: Self-pay | Admitting: Podiatry

## 2021-02-25 DIAGNOSIS — D689 Coagulation defect, unspecified: Secondary | ICD-10-CM

## 2021-02-25 DIAGNOSIS — M79674 Pain in right toe(s): Secondary | ICD-10-CM | POA: Diagnosis not present

## 2021-02-25 DIAGNOSIS — B351 Tinea unguium: Secondary | ICD-10-CM | POA: Diagnosis not present

## 2021-02-25 DIAGNOSIS — M79675 Pain in left toe(s): Secondary | ICD-10-CM

## 2021-02-25 NOTE — Progress Notes (Signed)
This patient returns to my office for at risk foot care.  This patient requires this care by a professional since this patient will be at risk due to having coagulation defect due to plavix.  This patient is unable to cut nails himself since the patient cannot reach his nails.These nails are painful walking and wearing shoes. Patient has burning left heel.  This patient presents for at risk foot care today.  General Appearance  Alert, conversant and in no acute stress.  Vascular  Dorsalis pedis and posterior tibial  pulses are palpable  bilaterally.  Capillary return is within normal limits  bilaterally. Temperature is within normal limits  bilaterally.  Neurologic  Senn-Weinstein monofilament wire test within normal limits  bilaterally. Muscle power within normal limits bilaterally.  Nails Thick disfigured discolored nails with subungual debris  from hallux to fifth toes bilaterally. No evidence of bacterial infection or drainage bilaterally.  Orthopedic  No limitations of motion  feet .  No crepitus or effusions noted.  No bony pathology or digital deformities noted.  Skin  normotropic skin with no porokeratosis noted bilaterally.  No signs of infections or ulcers noted.     Onychomycosis  Pain in right toes  Pain in left toes  Consent was obtained for treatment procedures.   Mechanical debridement of nails 1-5  bilaterally performed with a nail nipper.  Filed with dremel without incident.     Return office visit    3 months                 Told patient to return for periodic foot care and evaluation due to potential at risk complications.   Gardiner Barefoot DPM

## 2021-06-03 ENCOUNTER — Ambulatory Visit (INDEPENDENT_AMBULATORY_CARE_PROVIDER_SITE_OTHER): Payer: Medicare Other | Admitting: Podiatry

## 2021-06-03 ENCOUNTER — Other Ambulatory Visit: Payer: Self-pay

## 2021-06-03 ENCOUNTER — Encounter: Payer: Self-pay | Admitting: Podiatry

## 2021-06-03 ENCOUNTER — Encounter (INDEPENDENT_AMBULATORY_CARE_PROVIDER_SITE_OTHER): Payer: Self-pay

## 2021-06-03 DIAGNOSIS — B351 Tinea unguium: Secondary | ICD-10-CM | POA: Diagnosis not present

## 2021-06-03 DIAGNOSIS — D689 Coagulation defect, unspecified: Secondary | ICD-10-CM

## 2021-06-03 DIAGNOSIS — M79675 Pain in left toe(s): Secondary | ICD-10-CM | POA: Diagnosis not present

## 2021-06-03 DIAGNOSIS — M79674 Pain in right toe(s): Secondary | ICD-10-CM | POA: Diagnosis not present

## 2021-06-03 NOTE — Progress Notes (Signed)
This patient returns to my office for at risk foot care.  This patient requires this care by a professional since this patient will be at risk due to having coagulation defect due to plavix.  This patient is unable to cut nails himself since the patient cannot reach his nails.These nails are painful walking and wearing shoes. Patient has burning left heel.  This patient presents for at risk foot care today.  General Appearance  Alert, conversant and in no acute stress.  Vascular  Dorsalis pedis and posterior tibial  pulses are palpable  bilaterally.  Capillary return is within normal limits  bilaterally. Temperature is within normal limits  bilaterally.  Neurologic  Senn-Weinstein monofilament wire test within normal limits  bilaterally. Muscle power within normal limits bilaterally.  Nails Thick disfigured discolored nails with subungual debris  from hallux to fifth toes bilaterally. No evidence of bacterial infection or drainage bilaterally.  Orthopedic  No limitations of motion  feet .  No crepitus or effusions noted.  No bony pathology or digital deformities noted.  Skin  normotropic skin with no porokeratosis noted bilaterally.  No signs of infections or ulcers noted.     Onychomycosis  Pain in right toes  Pain in left toes  Consent was obtained for treatment procedures.   Mechanical debridement of nails 1-5  bilaterally performed with a nail nipper.  Filed with dremel without incident.     Return office visit    3 months                 Told patient to return for periodic foot care and evaluation due to potential at risk complications.   Gardiner Barefoot DPM

## 2021-09-09 ENCOUNTER — Other Ambulatory Visit: Payer: Self-pay

## 2021-09-09 ENCOUNTER — Encounter: Payer: Self-pay | Admitting: Podiatry

## 2021-09-09 ENCOUNTER — Ambulatory Visit (INDEPENDENT_AMBULATORY_CARE_PROVIDER_SITE_OTHER): Payer: Medicare Other | Admitting: Podiatry

## 2021-09-09 DIAGNOSIS — M79674 Pain in right toe(s): Secondary | ICD-10-CM | POA: Diagnosis not present

## 2021-09-09 DIAGNOSIS — B351 Tinea unguium: Secondary | ICD-10-CM | POA: Diagnosis not present

## 2021-09-09 DIAGNOSIS — M79675 Pain in left toe(s): Secondary | ICD-10-CM | POA: Diagnosis not present

## 2021-09-09 DIAGNOSIS — D689 Coagulation defect, unspecified: Secondary | ICD-10-CM

## 2021-09-09 NOTE — Progress Notes (Signed)
This patient returns to my office for at risk foot care.  This patient requires this care by a professional since this patient will be at risk due to having coagulation defect due to plavix.  This patient is unable to cut nails himself since the patient cannot reach his nails.These nails are painful walking and wearing shoes.  This patient presents for at risk foot care today.  General Appearance  Alert, conversant and in no acute stress.  Vascular  Dorsalis pedis and posterior tibial  pulses are palpable  bilaterally.  Capillary return is within normal limits  bilaterally. Temperature is within normal limits  bilaterally.  Neurologic  Senn-Weinstein monofilament wire test within normal limits  bilaterally. Muscle power within normal limits bilaterally.  Nails Thick disfigured discolored nails with subungual debris  from hallux to fifth toes bilaterally. No evidence of bacterial infection or drainage bilaterally.  Orthopedic  No limitations of motion  feet .  No crepitus or effusions noted.  No bony pathology or digital deformities noted.  Skin  normotropic skin with no porokeratosis noted bilaterally.  No signs of infections or ulcers noted.     Onychomycosis  Pain in right toes  Pain in left toes  Consent was obtained for treatment procedures.   Mechanical debridement of nails 1-5  bilaterally performed with a nail nipper.  Filed with dremel without incident.     Return office visit    3 months                 Told patient to return for periodic foot care and evaluation due to potential at risk complications.   Gardiner Barefoot DPM

## 2021-09-21 ENCOUNTER — Encounter
Admission: RE | Disposition: A | Discharge status: Home / Self Care | Payer: Self-pay | Source: Home / Self Care | Attending: Gastroenterology

## 2021-09-21 ENCOUNTER — Ambulatory Visit
Admission: RE | Admit: 2021-09-21 | Discharge: 2021-09-21 | Disposition: A | Discharge status: Home / Self Care | Payer: Medicare Other | Attending: Gastroenterology | Admitting: Gastroenterology

## 2021-09-21 ENCOUNTER — Ambulatory Visit: Payer: Medicare Other

## 2021-09-21 ENCOUNTER — Encounter: Payer: Self-pay | Admitting: *Deleted

## 2021-09-21 ENCOUNTER — Other Ambulatory Visit: Payer: Self-pay

## 2021-09-21 DIAGNOSIS — E669 Obesity, unspecified: Secondary | ICD-10-CM | POA: Diagnosis not present

## 2021-09-21 DIAGNOSIS — I2510 Atherosclerotic heart disease of native coronary artery without angina pectoris: Secondary | ICD-10-CM | POA: Diagnosis not present

## 2021-09-21 DIAGNOSIS — D123 Benign neoplasm of transverse colon: Secondary | ICD-10-CM | POA: Insufficient documentation

## 2021-09-21 DIAGNOSIS — I10 Essential (primary) hypertension: Secondary | ICD-10-CM | POA: Insufficient documentation

## 2021-09-21 DIAGNOSIS — Z9889 Other specified postprocedural states: Secondary | ICD-10-CM | POA: Insufficient documentation

## 2021-09-21 DIAGNOSIS — Z8673 Personal history of transient ischemic attack (TIA), and cerebral infarction without residual deficits: Secondary | ICD-10-CM | POA: Insufficient documentation

## 2021-09-21 DIAGNOSIS — K5730 Diverticulosis of large intestine without perforation or abscess without bleeding: Secondary | ICD-10-CM | POA: Insufficient documentation

## 2021-09-21 DIAGNOSIS — Z6831 Body mass index (BMI) 31.0-31.9, adult: Secondary | ICD-10-CM | POA: Insufficient documentation

## 2021-09-21 DIAGNOSIS — K449 Diaphragmatic hernia without obstruction or gangrene: Secondary | ICD-10-CM | POA: Diagnosis not present

## 2021-09-21 DIAGNOSIS — F209 Schizophrenia, unspecified: Secondary | ICD-10-CM | POA: Insufficient documentation

## 2021-09-21 DIAGNOSIS — K635 Polyp of colon: Secondary | ICD-10-CM | POA: Diagnosis not present

## 2021-09-21 DIAGNOSIS — Z1211 Encounter for screening for malignant neoplasm of colon: Secondary | ICD-10-CM | POA: Insufficient documentation

## 2021-09-21 HISTORY — PX: COLONOSCOPY WITH PROPOFOL: SHX5780

## 2021-09-21 SURGERY — COLONOSCOPY WITH PROPOFOL
Anesthesia: General

## 2021-09-21 MED ORDER — SODIUM CHLORIDE 0.9 % IV SOLN
INTRAVENOUS | Status: DC
  Administered 2021-09-21: 1000 mL via INTRAVENOUS

## 2021-09-21 MED ORDER — PROPOFOL 10 MG/ML IV BOLUS
INTRAVENOUS | Status: DC | PRN
  Administered 2021-09-21: 50 mg via INTRAVENOUS

## 2021-09-21 MED ORDER — PROPOFOL 500 MG/50ML IV EMUL
INTRAVENOUS | Status: DC | PRN
  Administered 2021-09-21: 200 ug/kg/min via INTRAVENOUS

## 2021-09-21 NOTE — Op Note (Signed)
[facility] Patient Name: [name] Procedure Date: 09/21/2021 10:51 AM MRN: [id number] Account #: [id number] Date of Birth: 26-Mar-1961 Admit Type: Outpatient Age: 61 Room: [facility] ENDO ROOM 1 Gender: Male Note Status: Finalized Instrument Name: Jasper Riling [id number] Procedure:             Colonoscopy Indications:           Screening for colorectal malignant neoplasm Providers:             Andrey Farmer MD, MD Referring MD:          Edmonia Lynch. Aycock MD (Referring MD) Medicines:             Monitored Anesthesia Care Complications:         No immediate complications. Estimated blood loss:                         Minimal. Procedure:             Pre-Anesthesia Assessment:                        - Prior to the procedure, a History and Physical was                         performed, and patient medications and allergies were                         reviewed. The patient is competent. The risks and                         benefits of the procedure and the sedation options and                         risks were discussed with the patient. All questions                         were answered and informed consent was obtained.                         Patient identification and proposed procedure were                         verified by the physician, the nurse, the                         anesthesiologist, the anesthetist and the technician                         in the endoscopy suite. Mental Status Examination:                         alert and oriented. Airway Examination: normal                         oropharyngeal airway and neck mobility. Respiratory                         Examination: clear to auscultation. CV Examination:  normal. Prophylactic Antibiotics: The patient does not                         require prophylactic antibiotics. Prior                         Anticoagulants: The patient has taken Plavix                          (clopidogrel), last dose was 5 days prior to                         procedure. ASA Grade Assessment: III - A patient with                         severe systemic disease. After reviewing the risks and                         benefits, the patient was deemed in satisfactory                         condition to undergo the procedure. The anesthesia                         plan was to use monitored anesthesia care (MAC).                         Immediately prior to administration of medications,                         the patient was re-assessed for adequacy to receive                         sedatives. The heart rate, respiratory rate, oxygen                         saturations, blood pressure, adequacy of pulmonary                         ventilation, and response to care were monitored                         throughout the procedure. The physical status of the                         patient was re-assessed after the procedure.                        After obtaining informed consent, the colonoscope was                         passed under direct vision. Throughout the procedure,                         the patient's blood pressure, pulse, and oxygen                         saturations were monitored continuously. The  Colonoscope was introduced through the anus and                         advanced to the the cecum, identified by appendiceal                         orifice and ileocecal valve. The colonoscopy was                         somewhat difficult due to inadequate bowel prep. The                         patient tolerated the procedure well. The quality of                         the bowel preparation was fair. Findings:      The perianal and digital rectal examinations were normal.      A 2 mm polyp was found in the transverse colon. The polyp was sessile.       The polyp was removed with a jumbo cold forceps. Resection and retrieval       were  complete. Estimated blood loss was minimal.      A 5 mm polyp was found in the splenic flexure. The polyp was       pedunculated. Very strange appearing as it appeared almost like a pseudo       polyp. The polyp was removed with a cold snare. Resection and retrieval       were complete. Estimated blood loss was minimal.      A few small-mouthed diverticula were found in the sigmoid colon.      Internal hemorrhoids were found during retroflexion. The hemorrhoids       were Grade I (internal hemorrhoids that do not prolapse).      The exam was otherwise without abnormality on direct and retroflexion       views. Impression:            - Preparation of the colon was fair.                        - One 2 mm polyp in the transverse colon, removed with                         a jumbo cold forceps. Resected and retrieved.                        - One 5 mm polyp at the splenic flexure, removed with                         a cold snare. Resected and retrieved.                        - Diverticulosis in the sigmoid colon.                        - Internal hemorrhoids.                        - The examination was otherwise normal on direct and  retroflexion views. Recommendation:        - Discharge patient to home.                        - Resume previous diet.                        - Continue present medications.                        - Await pathology results.                        - Repeat colonoscopy in 1 year because the bowel                         preparation was suboptimal.                        - Return to referring physician as previously                         scheduled. Procedure Code(s):     --- Professional ---                        [phone], Colonoscopy, flexible; with removal of                         tumor(s), polyp(s), or other lesion(s) by snare                         technique                        45380, 75, Colonoscopy, flexible; with biopsy,  single                         or multiple Diagnosis Code(s):     --- Professional ---                        Z12.11, Encounter for screening for malignant neoplasm                         of colon                        K64.0, First degree hemorrhoids                        K63.5, Polyp of colon                        K57.30, Diverticulosis of large intestine without                         perforation or abscess without bleeding CPT copyright 2019 American Medical Association. All rights reserved. The codes documented in this report are preliminary and upon coder review may  be revised to meet current compliance requirements. Andrey Farmer MD, MD 09/21/2021 11:30:01 AM Number of Addenda: 0 Note Initiated On: 09/21/2021 10:51 AM Scope Withdrawal Time: 0 hours 12 minutes 29 seconds  Total Procedure Duration: 0 hours  19 minutes 40 seconds  Estimated Blood Loss:  Estimated blood loss was minimal.      [facility]

## 2021-09-21 NOTE — Transfer of Care (Signed)
Immediate Anesthesia Transfer of Care Note  Patient: [name]  Procedure(s) Performed: COLONOSCOPY WITH PROPOFOL  Patient Location: PACU  Anesthesia Type:General  Level of Consciousness: awake and oriented  Airway & Oxygen Therapy: Patient Spontanous Breathing and Patient connected to nasal cannula oxygen  Post-op Assessment: Report given to RN and Post -op Vital signs reviewed and stable  Post vital signs: Reviewed and stable  Last Vitals:  Vitals Value Taken Time  BP 113/83 09/21/21 1130  Temp    Pulse 74 09/21/21 1131  Resp 20 09/21/21 1131  SpO2 100 % 09/21/21 1131  Vitals shown include unvalidated device data.  Last Pain:  Vitals:   09/21/21 1017  TempSrc: Temporal  PainSc: 0-No pain         Complications: No notable events documented.

## 2021-09-21 NOTE — H&P (Signed)
Outpatient short stay form Pre-procedure 09/21/2021  Lesly Rubenstein, MD  Primary Physician: Donnie Coffin, MD  Reason for visit:  Screening colonoscopy  History of present illness:    61 y/o gentleman with history of CAD, hypertension, and schizophrenia here for screening colonoscopy. Last colonoscopy in 2011 was normal. History of hernia repair. No family history of GI malignancies.    Current Facility-Administered Medications:    0.9 %  sodium chloride infusion, , Intravenous, Continuous, Kaidynce Pfister, Hilton Cork, MD, Last Rate: 20 mL/hr at 09/21/21 1030, 1,000 mL at 09/21/21 1030  Medications Prior to Admission  Medication Sig Dispense Refill Last Dose   acetaminophen (TYLENOL) 325 MG tablet Take 650 mg by mouth 2 (two) times daily as needed for mild pain.   Past Week   amantadine (SYMMETREL) 100 MG capsule Take 100 mg by mouth 2 (two) times daily.   09/21/2021 at 0800   aspirin EC 81 MG tablet Take by mouth.   09/21/2021 at 0800   atenolol (TENORMIN) 50 MG tablet Take 50 mg by mouth daily.    09/21/2021 at 0800   budesonide-formoterol (SYMBICORT) 160-4.5 MCG/ACT inhaler Inhale 2 puffs into the lungs 2 (two) times daily.   09/21/2021 at 0800   clopidogrel (PLAVIX) 75 MG tablet    09/16/2021   dextromethorphan-guaiFENesin (ROBITUSSIN-DM) 10-100 MG/5ML liquid Take 10 mLs by mouth every 6 (six) hours as needed for cough.   Past Week   diltiazem (CARDIZEM CD) 180 MG 24 hr capsule Take 180 mg by mouth daily.    09/21/2021 at 0800   docusate sodium (COLACE) 100 MG capsule Take 100 mg by mouth 2 (two) times daily as needed for mild constipation.   Past Week   gabapentin (NEURONTIN) 100 MG capsule Take 100 mg by mouth at bedtime.    09/21/2021 at 0800   guaiFENesin (MUCINEX) 600 MG 12 hr tablet Take 600 mg by mouth 2 (two) times daily.   09/21/2021 at 0800   hydrochlorothiazide (HYDRODIURIL) 12.5 MG tablet    09/21/2021 at 0800   ibuprofen (ADVIL) 800 MG tablet Take 1 tablet (800 mg total) by mouth  every 8 (eight) hours as needed. 30 tablet 0 Past Week   INVEGA TRINZA 819 MG/2.625ML SUSY Inject 819 mg into the muscle every 3 (three) months.    09/21/2021 at 0800   ipratropium-albuterol (DUONEB) 0.5-2.5 (3) MG/3ML SOLN Take 3 mLs by nebulization every 6 (six) hours as needed (every 4 to 6 hours as needed for shortnes of breath or wheeziing).    09/21/2021 at 0800   lovastatin (MEVACOR) 20 MG tablet Take 20 mg by mouth at bedtime.   09/21/2021 at 0800   Melatonin 5 MG TABS Take 1 tablet by mouth at bedtime as needed.   09/20/2021 at 2200   mirtazapine (REMERON) 30 MG tablet Take 30 mg by mouth at bedtime.   09/21/2021 at 0800   Multiple Vitamin (THEREMS PO) Take by mouth daily.   09/21/2021 at 0800   Omega-3 Fatty Acids (FISH OIL) 1000 MG CPDR Take by mouth.   09/21/2021 at 080   potassium chloride SA (K-DUR) 20 MEQ tablet    09/21/2021 at 0800   Pyridoxine HCl (B-6) 100 MG TABS Take by mouth daily.   09/21/2021 at 0800   QUEtiapine (SEROQUEL) 200 MG tablet Take 200 mg by mouth at bedtime.   09/20/2021 at 2200   tiotropium (SPIRIVA) 18 MCG inhalation capsule Place 1 capsule (18 mcg total) into inhaler and inhale daily. [place]  capsule 12 09/21/2021 at 0800   VENTOLIN HFA 108 (90 Base) MCG/ACT inhaler Inhale 2 puffs into the lungs every 4 (four) hours as needed.   09/21/2021 at 0800   VITAMIN D, ERGOCALCIFEROL, PO Take 1,000 Units by mouth daily.    09/21/2021 at 0800     No Known Allergies   Past Medical History:  Diagnosis Date   COPD (chronic obstructive pulmonary disease) (HCC)    Depression    History of hiatal hernia    Hypertension    Pneumonia    Schizophrenia (H[facility])    Shortness of breath dyspnea    Stroke [facility])    Umbilical hernia     Review of systems:  Otherwise negative.    Physical Exam  Gen: Alert, oriented. Appears stated age.  HEENT:PERRLA. Lungs: No respiratory distress CV: RRR Abd: soft, benign, no masses Ext: No edema    Planned procedures: Proceed with colonoscopy.  The patient understands the nature of the planned procedure, indications, risks, alternatives and potential complications including but not limited to bleeding, infection, perforation, damage to internal organs and possible oversedation/side effects from anesthesia. The patient agrees and gives consent to proceed.  Please refer to procedure notes for findings, recommendations and patient disposition/instructions.     Lesly Rubenstein, MD [facility]

## 2021-09-21 NOTE — Interval H&P Note (Signed)
History and Physical Interval Note:  09/21/2021 10:54 AM  [name]  has presented today for surgery, with the diagnosis of COLON CANCER SCREENING.  The various methods of treatment have been discussed with the patient and family. After consideration of risks, benefits and other options for treatment, the patient has consented to  Procedure(s): COLONOSCOPY WITH PROPOFOL (N/A) as a surgical intervention.  The patient's history has been reviewed, patient examined, no change in status, stable for surgery.  I have reviewed the patient's chart and labs.  Questions were answered to the patient's satisfaction.     Lesly Rubenstein  Ok to proceed with colonoscopy

## 2021-09-21 NOTE — Interval H&P Note (Signed)
History and Physical Interval Note:  09/21/2021 11:02 AM  [name]  has presented today for surgery, with the diagnosis of COLON CANCER SCREENING.  The various methods of treatment have been discussed with the patient and family. After consideration of risks, benefits and other options for treatment, the patient has consented to  Procedure(s): COLONOSCOPY WITH PROPOFOL (N/A) as a surgical intervention.  The patient's history has been reviewed, patient examined, no change in status, stable for surgery.  I have reviewed the patient's chart and labs.  Questions were answered to the patient's satisfaction.     Hilton Cork Mithcell Schumpert  Plavix has been held 5 days per patient.

## 2021-09-21 NOTE — Anesthesia Preprocedure Evaluation (Addendum)
Anesthesia Evaluation  Patient identified by MRN, date of birth, ID band Patient awake    Reviewed: Allergy & Precautions, NPO status , Patient's Chart, lab work & pertinent test results, reviewed documented beta blocker date and time   Airway Mallampati: III  TM Distance: >3 FB Neck ROM: full    Dental  (+) Upper Dentures, Lower Dentures   Pulmonary shortness of breath, COPD, Current SmokerPatient did not abstain from smoking.,    + rhonchi        Cardiovascular Exercise Tolerance: Good hypertension, Pt. on medications and Pt. on home beta blockers Normal cardiovascular exam     Neuro/Psych PSYCHIATRIC DISORDERS Schizophrenia CVA (1978), No Residual Symptoms    GI/Hepatic Neg liver ROS, hiatal hernia,   Endo/Other  negative endocrine ROS  Renal/GU negative Renal ROS  negative genitourinary   Musculoskeletal   Abdominal (+) + obese,   Peds  Hematology negative hematology ROS (+)   Anesthesia Other Findings Past Medical History: No date: COPD (chronic obstructive pulmonary disease) (HCC) No date: Depression No date: History of hiatal hernia No date: Hypertension No date: Pneumonia No date: Schizophrenia ([place]) No date: Shortness of breath dyspnea No date: Stroke [facility]) No date: Umbilical hernia  Past Surgical History: No date: HERNIA REPAIR 12/18/2014: INSERTION OF MESH; N/A     Comment:  Procedure: INSERTION OF MESH;  Surgeon: Florene Glen, MD;  Location: [facility] ORS;  Service: General;                Laterality: N/A; 5/62/5638: SUPRA-UMBILICAL HERNIA     Comment:  Procedure: SUPRA-UMBILICAL HERNIA;  Surgeon: Florene Glen, MD;  Location: [facility] ORS;  Service: General;; 9/37/3428: UMBILICAL HERNIA REPAIR; N/A     Comment:  Procedure: HERNIA REPAIR UMBILICAL ADULT;  Surgeon:               Florene Glen, MD;  Location: [facility] ORS;  Service:               General;  Laterality:  N/A;     Reproductive/Obstetrics negative OB ROS                           Anesthesia Physical Anesthesia Plan  ASA: 3  Anesthesia Plan: General   Post-op Pain Management:    Induction: Intravenous  PONV Risk Score and Plan: Propofol infusion and TIVA  Airway Management Planned: Simple Face Mask and Natural Airway  Additional Equipment:   Intra-op Plan:   Post-operative Plan:   Informed Consent: I have reviewed the patients History and Physical, chart, labs and discussed the procedure including the risks, benefits and alternatives for the proposed anesthesia with the patient or authorized representative who has indicated his/her understanding and acceptance.     Dental advisory given  Plan Discussed with: Anesthesiologist, CRNA and Surgeon  Anesthesia Plan Comments:        Anesthesia Quick Evaluation

## 2021-09-21 NOTE — Anesthesia Procedure Notes (Signed)
Date/Time: 09/21/2021 11:09 AM Performed by: Nelda Marseille, CRNA Pre-anesthesia Checklist: Patient identified, Emergency Drugs available, Suction available, Patient being monitored and Timeout performed Oxygen Delivery Method: Nasal cannula

## 2021-09-21 NOTE — Anesthesia Postprocedure Evaluation (Signed)
Anesthesia Post Note  Patient: [name]  Procedure(s) Performed: COLONOSCOPY WITH PROPOFOL  Patient location during evaluation: PACU Anesthesia Type: General Level of consciousness: awake and alert Pain management: pain level controlled Vital Signs Assessment: post-procedure vital signs reviewed and stable Respiratory status: spontaneous breathing, nonlabored ventilation and respiratory function stable Cardiovascular status: blood pressure returned to baseline and stable Postop Assessment: no apparent nausea or vomiting Anesthetic complications: no   No notable events documented.   Last Vitals:  Vitals:   09/21/21 1017 09/21/21 1130  BP: (!) 135/106 113/83  Pulse: 73 74  Resp: 20 (!) 25  Temp: (!) 36.3 C (!) 36.1 C  SpO2: 98% 100%    Last Pain:  Vitals:   09/21/21 1130  TempSrc: Temporal  PainSc: 0-No pain                 Iran Ouch

## 2021-09-22 ENCOUNTER — Encounter: Payer: Self-pay | Admitting: Gastroenterology

## 2021-09-22 LAB — SURGICAL PATHOLOGY

## 2021-11-29 ENCOUNTER — Other Ambulatory Visit: Payer: Self-pay | Admitting: *Deleted

## 2021-11-29 DIAGNOSIS — Z87891 Personal history of nicotine dependence: Secondary | ICD-10-CM

## 2021-11-29 DIAGNOSIS — F17210 Nicotine dependence, cigarettes, uncomplicated: Secondary | ICD-10-CM

## 2021-11-29 DIAGNOSIS — Z122 Encounter for screening for malignant neoplasm of respiratory organs: Secondary | ICD-10-CM

## 2021-12-06 ENCOUNTER — Inpatient Hospital Stay
Admission: EM | Admit: 2021-12-06 | Discharge: 2021-12-14 | DRG: 190 | Disposition: A | Discharge status: Home / Self Care | Payer: Medicare Other | Attending: Internal Medicine | Admitting: Internal Medicine

## 2021-12-06 ENCOUNTER — Emergency Department: Payer: Medicare Other

## 2021-12-06 ENCOUNTER — Encounter: Payer: Self-pay | Admitting: Intensive Care

## 2021-12-06 ENCOUNTER — Other Ambulatory Visit: Payer: Self-pay

## 2021-12-06 DIAGNOSIS — Z8673 Personal history of transient ischemic attack (TIA), and cerebral infarction without residual deficits: Secondary | ICD-10-CM

## 2021-12-06 DIAGNOSIS — I10 Essential (primary) hypertension: Secondary | ICD-10-CM | POA: Diagnosis present

## 2021-12-06 DIAGNOSIS — F209 Schizophrenia, unspecified: Secondary | ICD-10-CM | POA: Diagnosis present

## 2021-12-06 DIAGNOSIS — F17210 Nicotine dependence, cigarettes, uncomplicated: Secondary | ICD-10-CM | POA: Diagnosis present

## 2021-12-06 DIAGNOSIS — J9601 Acute respiratory failure with hypoxia: Secondary | ICD-10-CM | POA: Diagnosis present

## 2021-12-06 DIAGNOSIS — Z7951 Long term (current) use of inhaled steroids: Secondary | ICD-10-CM | POA: Diagnosis not present

## 2021-12-06 DIAGNOSIS — C3490 Malignant neoplasm of unspecified part of unspecified bronchus or lung: Secondary | ICD-10-CM | POA: Diagnosis present

## 2021-12-06 DIAGNOSIS — Z8249 Family history of ischemic heart disease and other diseases of the circulatory system: Secondary | ICD-10-CM

## 2021-12-06 DIAGNOSIS — J9621 Acute and chronic respiratory failure with hypoxia: Secondary | ICD-10-CM

## 2021-12-06 DIAGNOSIS — B9789 Other viral agents as the cause of diseases classified elsewhere: Secondary | ICD-10-CM | POA: Diagnosis present

## 2021-12-06 DIAGNOSIS — I700 Atherosclerosis of aorta: Secondary | ICD-10-CM | POA: Diagnosis present

## 2021-12-06 DIAGNOSIS — D72829 Elevated white blood cell count, unspecified: Secondary | ICD-10-CM

## 2021-12-06 DIAGNOSIS — R911 Solitary pulmonary nodule: Secondary | ICD-10-CM | POA: Diagnosis present

## 2021-12-06 DIAGNOSIS — Z20822 Contact with and (suspected) exposure to covid-19: Secondary | ICD-10-CM | POA: Diagnosis present

## 2021-12-06 DIAGNOSIS — F17200 Nicotine dependence, unspecified, uncomplicated: Secondary | ICD-10-CM | POA: Diagnosis present

## 2021-12-06 DIAGNOSIS — C3492 Malignant neoplasm of unspecified part of left bronchus or lung: Secondary | ICD-10-CM | POA: Diagnosis present

## 2021-12-06 DIAGNOSIS — R918 Other nonspecific abnormal finding of lung field: Secondary | ICD-10-CM | POA: Diagnosis present

## 2021-12-06 DIAGNOSIS — J441 Chronic obstructive pulmonary disease with (acute) exacerbation: Principal | ICD-10-CM | POA: Diagnosis present

## 2021-12-06 DIAGNOSIS — Z7982 Long term (current) use of aspirin: Secondary | ICD-10-CM

## 2021-12-06 DIAGNOSIS — Z716 Tobacco abuse counseling: Secondary | ICD-10-CM | POA: Diagnosis not present

## 2021-12-06 DIAGNOSIS — Z79899 Other long term (current) drug therapy: Secondary | ICD-10-CM | POA: Diagnosis not present

## 2021-12-06 DIAGNOSIS — F203 Undifferentiated schizophrenia: Secondary | ICD-10-CM | POA: Diagnosis present

## 2021-12-06 DIAGNOSIS — Z91148 Patient's other noncompliance with medication regimen for other reason: Secondary | ICD-10-CM

## 2021-12-06 DIAGNOSIS — Z7902 Long term (current) use of antithrombotics/antiplatelets: Secondary | ICD-10-CM | POA: Diagnosis not present

## 2021-12-06 HISTORY — DX: Acute and chronic respiratory failure with hypoxia: J96.21

## 2021-12-06 HISTORY — DX: Unspecified asthma, uncomplicated: J45.909

## 2021-12-06 LAB — COMPREHENSIVE METABOLIC PANEL
ALT: 29 U/L (ref 0–44)
AST: 29 U/L (ref 15–41)
Albumin: 4.1 g/dL (ref 3.5–5.0)
Alkaline Phosphatase: 62 U/L (ref 38–126)
Anion gap: 10 (ref 5–15)
BUN: 11 mg/dL (ref 8–23)
CO2: 25 mmol/L (ref 22–32)
Calcium: 9.4 mg/dL (ref 8.9–10.3)
Chloride: 101 mmol/L (ref 98–111)
Creatinine, Ser: 0.76 mg/dL (ref 0.61–1.24)
GFR, Estimated: >60 mL/min (ref >60)
Glucose, Bld: 108 mg/dL — ABNORMAL HIGH (ref 70–99)
Potassium: 3.6 mmol/L (ref 3.5–5.1)
Sodium: 136 mmol/L (ref 135–145)
Total Bilirubin: 0.6 mg/dL (ref 0.3–1.2)
Total Protein: 7.6 g/dL (ref 6.5–8.1)

## 2021-12-06 LAB — CBC
HCT: 41.4 % (ref 39.0–52.0)
Hemoglobin: 13.8 g/dL (ref 13.0–17.0)
MCH: 30.3 pg (ref 26.0–34.0)
MCHC: 33.3 g/dL (ref 30.0–36.0)
MCV: 90.8 fL (ref 80.0–100.0)
Platelets: 180 10*3/uL (ref 150–400)
RBC: 4.56 MIL/uL (ref 4.22–5.81)
RDW: 13.5 % (ref 11.5–15.5)
WBC: 10.1 10*3/uL (ref 4.0–10.5)
nRBC: 0 % (ref 0.0–0.2)

## 2021-12-06 LAB — RESPIRATORY PANEL BY PCR

## 2021-12-06 LAB — BRAIN NATRIURETIC PEPTIDE: B Natriuretic Peptide: 26.3 pg/mL (ref 0.0–100.0)

## 2021-12-06 LAB — RESP PANEL BY RT-PCR (FLU A&B, COVID) ARPGX2
Influenza A by PCR: NEGATIVE
Influenza B by PCR: NEGATIVE
SARS Coronavirus 2 by RT PCR: NEGATIVE

## 2021-12-06 LAB — TROPONIN I (HIGH SENSITIVITY): Troponin I (High Sensitivity): 7 ng/L (ref <18)

## 2021-12-06 LAB — HIV ANTIBODY (ROUTINE TESTING W REFLEX): HIV Screen 4th Generation wRfx: NONREACTIVE

## 2021-12-06 MED ORDER — CLOPIDOGREL BISULFATE 75 MG PO TABS
75.0000 mg | ORAL_TABLET | Freq: Every day | ORAL | Status: DC
  Administered 2021-12-07 – 2021-12-14 (×8): 75 mg via ORAL
  Filled 2021-12-06 (×8): qty 1

## 2021-12-06 MED ORDER — DOCUSATE SODIUM 100 MG PO CAPS: 100.0000 mg | ORAL_CAPSULE | Freq: Two times a day (BID) | ORAL | Status: DC | PRN

## 2021-12-06 MED ORDER — MOMETASONE FURO-FORMOTEROL FUM 200-5 MCG/ACT IN AERO
2.0000 | INHALATION_SPRAY | Freq: Two times a day (BID) | RESPIRATORY_TRACT | Status: DC
  Administered 2021-12-07 – 2021-12-12 (×11): 2 via RESPIRATORY_TRACT
  Filled 2021-12-06: qty 8.8

## 2021-12-06 MED ORDER — IPRATROPIUM-ALBUTEROL 0.5-2.5 (3) MG/3ML IN SOLN
3.0000 mL | Freq: Four times a day (QID) | RESPIRATORY_TRACT | Status: DC
  Administered 2021-12-06 – 2021-12-08 (×8): 3 mL via RESPIRATORY_TRACT
  Filled 2021-12-06 (×8): qty 3

## 2021-12-06 MED ORDER — VITAMIN B-6 50 MG PO TABS
100.0000 mg | ORAL_TABLET | Freq: Every day | ORAL | Status: DC
  Administered 2021-12-07 – 2021-12-14 (×8): 100 mg via ORAL
  Filled 2021-12-06 (×8): qty 2

## 2021-12-06 MED ORDER — METHYLPREDNISOLONE SODIUM SUCC 40 MG IJ SOLR
40.0000 mg | Freq: Two times a day (BID) | INTRAMUSCULAR | Status: AC
  Administered 2021-12-06 – 2021-12-07 (×2): 40 mg via INTRAVENOUS
  Filled 2021-12-06 (×2): qty 1

## 2021-12-06 MED ORDER — MAGNESIUM SULFATE 2 GM/50ML IV SOLN
2.0000 g | Freq: Once | INTRAVENOUS | Status: AC
  Administered 2021-12-06: 2 g via INTRAVENOUS
  Filled 2021-12-06: qty 50

## 2021-12-06 MED ORDER — ENOXAPARIN SODIUM 40 MG/0.4ML IJ SOSY: 40.0000 mg | PREFILLED_SYRINGE | INTRAMUSCULAR | Status: DC

## 2021-12-06 MED ORDER — MIRTAZAPINE 15 MG PO TABS
30.0000 mg | ORAL_TABLET | Freq: Every day | ORAL | Status: DC
  Administered 2021-12-06 – 2021-12-13 (×8): 30 mg via ORAL
  Filled 2021-12-06 (×8): qty 2

## 2021-12-06 MED ORDER — VITAMIN D (ERGOCALCIFEROL) 1.25 MG (50000 UNIT) PO CAPS: 50000.0000 [IU] | ORAL_CAPSULE | ORAL | Status: DC

## 2021-12-06 MED ORDER — MELATONIN 5 MG PO TABS
5.0000 mg | ORAL_TABLET | Freq: Every evening | ORAL | Status: DC | PRN
  Administered 2021-12-06 – 2021-12-13 (×8): 5 mg via ORAL
  Filled 2021-12-06 (×10): qty 1

## 2021-12-06 MED ORDER — GUAIFENESIN ER 600 MG PO TB12
600.0000 mg | ORAL_TABLET | Freq: Two times a day (BID) | ORAL | Status: DC
  Administered 2021-12-06 – 2021-12-14 (×17): 600 mg via ORAL
  Filled 2021-12-06 (×17): qty 1

## 2021-12-06 MED ORDER — ASPIRIN EC 81 MG PO TBEC: 81.0000 mg | DELAYED_RELEASE_TABLET | Freq: Every day | ORAL | Status: DC

## 2021-12-06 MED ORDER — ACETAMINOPHEN 325 MG PO TABS: 650.0000 mg | ORAL_TABLET | Freq: Two times a day (BID) | ORAL | Status: DC | PRN

## 2021-12-06 MED ORDER — PALIPERIDONE PALMITATE ER 819 MG/2.63ML IM SUSY: 819.0000 mg | PREFILLED_SYRINGE | INTRAMUSCULAR | Status: DC

## 2021-12-06 MED ORDER — OMEGA-3-ACID ETHYL ESTERS 1 G PO CAPS
1.0000 g | ORAL_CAPSULE | Freq: Every day | ORAL | Status: DC
  Administered 2021-12-07 – 2021-12-08 (×2): 1 g via ORAL
  Filled 2021-12-06 (×2): qty 1

## 2021-12-06 MED ORDER — ENOXAPARIN SODIUM 60 MG/0.6ML IJ SOSY
0.5000 mg/kg | PREFILLED_SYRINGE | INTRAMUSCULAR | Status: DC
  Administered 2021-12-06 – 2021-12-13 (×8): 55 mg via SUBCUTANEOUS
  Filled 2021-12-06 (×8): qty 0.6

## 2021-12-06 MED ORDER — POTASSIUM CHLORIDE CRYS ER 20 MEQ PO TBCR
20.0000 meq | EXTENDED_RELEASE_TABLET | Freq: Every day | ORAL | Status: DC
  Administered 2021-12-07: 20 meq via ORAL
  Filled 2021-12-06: qty 1

## 2021-12-06 MED ORDER — ATENOLOL 50 MG PO TABS
50.0000 mg | ORAL_TABLET | Freq: Every day | ORAL | Status: DC
  Administered 2021-12-07 – 2021-12-08 (×2): 50 mg via ORAL
  Filled 2021-12-06 (×2): qty 1

## 2021-12-06 MED ORDER — DIPHENHYDRAMINE HCL 50 MG/ML IJ SOLN
12.5000 mg | Freq: Once | INTRAMUSCULAR | Status: AC
  Administered 2021-12-06: 12.5 mg via INTRAVENOUS
  Filled 2021-12-06: qty 1

## 2021-12-06 MED ORDER — ONDANSETRON HCL 4 MG PO TABS: 4.0000 mg | ORAL_TABLET | Freq: Four times a day (QID) | ORAL | Status: DC | PRN

## 2021-12-06 MED ORDER — IPRATROPIUM-ALBUTEROL 0.5-2.5 (3) MG/3ML IN SOLN
3.0000 mL | Freq: Once | RESPIRATORY_TRACT | Status: AC
  Administered 2021-12-06: 3 mL via RESPIRATORY_TRACT
  Filled 2021-12-06: qty 3

## 2021-12-06 MED ORDER — PREDNISONE 20 MG PO TABS: 40.0000 mg | ORAL_TABLET | Freq: Every day | ORAL | Status: DC

## 2021-12-06 MED ORDER — GABAPENTIN 100 MG PO CAPS
100.0000 mg | ORAL_CAPSULE | Freq: Every day | ORAL | Status: DC
  Administered 2021-12-06 – 2021-12-13 (×8): 100 mg via ORAL
  Filled 2021-12-06 (×8): qty 1

## 2021-12-06 MED ORDER — ONDANSETRON HCL 4 MG/2ML IJ SOLN
4.0000 mg | Freq: Four times a day (QID) | INTRAMUSCULAR | Status: DC | PRN
  Administered 2021-12-06 – 2021-12-09 (×4): 4 mg via INTRAVENOUS
  Filled 2021-12-06 (×4): qty 2

## 2021-12-06 MED ORDER — AMANTADINE HCL 100 MG PO CAPS
100.0000 mg | ORAL_CAPSULE | Freq: Two times a day (BID) | ORAL | Status: DC
  Administered 2021-12-06 – 2021-12-14 (×14): 100 mg via ORAL
  Filled 2021-12-06 (×18): qty 1

## 2021-12-06 MED ORDER — QUETIAPINE FUMARATE 200 MG PO TABS
200.0000 mg | ORAL_TABLET | Freq: Every day | ORAL | Status: DC
  Administered 2021-12-06 – 2021-12-13 (×8): 200 mg via ORAL
  Filled 2021-12-06 (×8): qty 1

## 2021-12-06 MED ORDER — PRAVASTATIN SODIUM 20 MG PO TABS
20.0000 mg | ORAL_TABLET | Freq: Every day | ORAL | Status: DC
  Administered 2021-12-06 – 2021-12-13 (×8): 20 mg via ORAL
  Filled 2021-12-06 (×8): qty 1

## 2021-12-06 MED ORDER — SODIUM CHLORIDE 0.9 % IV SOLN
2.0000 g | Freq: Three times a day (TID) | INTRAVENOUS | Status: AC
  Administered 2021-12-06 – 2021-12-11 (×15): 2 g via INTRAVENOUS
  Filled 2021-12-06: qty 2
  Filled 2021-12-06: qty 12.5
  Filled 2021-12-06: qty 2
  Filled 2021-12-06 (×11): qty 12.5
  Filled 2021-12-06: qty 2

## 2021-12-06 MED ORDER — IPRATROPIUM-ALBUTEROL 0.5-2.5 (3) MG/3ML IN SOLN
3.0000 mL | RESPIRATORY_TRACT | Status: DC | PRN
Start: 2021-12-06 — End: 2021-12-14

## 2021-12-06 MED ORDER — DILTIAZEM HCL ER COATED BEADS 180 MG PO CP24
180.0000 mg | ORAL_CAPSULE | Freq: Every day | ORAL | Status: DC
  Administered 2021-12-07 – 2021-12-14 (×8): 180 mg via ORAL
  Filled 2021-12-06 (×8): qty 1

## 2021-12-06 MED ORDER — HYDROCHLOROTHIAZIDE 12.5 MG PO TABS
12.5000 mg | ORAL_TABLET | Freq: Every day | ORAL | Status: DC
  Administered 2021-12-07 – 2021-12-14 (×8): 12.5 mg via ORAL
  Filled 2021-12-06 (×8): qty 1

## 2021-12-06 MED ORDER — NICOTINE 14 MG/24HR TD PT24
14.0000 mg | MEDICATED_PATCH | Freq: Every day | TRANSDERMAL | Status: DC
  Administered 2021-12-06 – 2021-12-14 (×9): 14 mg via TRANSDERMAL
  Filled 2021-12-06 (×9): qty 1

## 2021-12-06 MED ORDER — ADULT MULTIVITAMIN W/MINERALS CH
ORAL_TABLET | Freq: Every day | ORAL | Status: DC
  Administered 2021-12-06 – 2021-12-14 (×9): 1 via ORAL
  Filled 2021-12-06 (×9): qty 1

## 2021-12-06 NOTE — Assessment & Plan Note (Signed)
Blood pressure is stable ?Continue atenolol, hydrochlorothiazide and Cardizem ? ?

## 2021-12-06 NOTE — Assessment & Plan Note (Addendum)
Patient presents for evaluation of worsening shortness of breath, cough productive of greenish phlegm and diffuse wheezes Scheduled and as needed bronchodilator therapy Treated with Solu-Medrol 40 mg IV every 12 - completed steroids during admission Continue oxygen supplementation to maintain pulse oximetry greater than 92%

## 2021-12-06 NOTE — ED Provider Notes (Signed)
? ?[facility] ?Provider Note ? ? ? Event Date/Time  ? First MD Initiated Contact with Patient 12/06/21 [phone]   ?  (approximate) ? ? ?History  ? ?Shortness of Breath ? ? ?HPI ? ?Bradrick Kamau Burack is a 61 y.o. male with a history of COPD who is not on home oxygen who presents with shortness of breath.  He reports is been worsening over the last week.  He reports he becomes very winded with any exertion at all and feels short of breath even at rest.  Subjective fever this morning.  Mild dry cough.  No leg swelling or pain. ?  ? ? ?Physical Exam  ? ?Triage Vital Signs: ?ED Triage Vitals  ?Enc Vitals Group  ?   BP 12/06/21 0753 (!) 123/97  ?   Pulse Rate 12/06/21 0753 98  ?   Resp 12/06/21 0753 (!) 27  ?   Temp 12/06/21 0753 98.3 ?F (36.8 ?C)  ?   Temp Source 12/06/21 0753 Oral  ?   SpO2 12/06/21 0753 90 %  ?   Weight 12/06/21 0755 110.2 kg (243 lb)  ?   Height 12/06/21 0755 1.854 m (6\' 1" )  ?   Head Circumference --   ?   Peak Flow --   ?   Pain Score 12/06/21 0754 6  ?   Pain Loc --   ?   Pain Edu? --   ?   Excl. in [place]? --   ? ? ?Most recent vital signs: ?Vitals:  ? 12/06/21 0753  ?BP: (!) 123/97  ?Pulse: 98  ?Resp: (!) 27  ?Temp: 98.3 ?F (36.8 ?C)  ?SpO2: 90%  ? ? ? ?General: Awake, no distress.  ?CV:  Good peripheral perfusion.  ?Resp:  Increased respiratory effort with tachypnea, diffuse wheezing ?Abd:  No distention.  ? ? ?ED Results / Procedures / Treatments  ? ?Labs ?(all labs ordered are listed, but only abnormal results are displayed) ?Labs Reviewed  ?COMPREHENSIVE METABOLIC PANEL - Abnormal; Notable for the following components:  ?    Result Value  ? Glucose, Bld 108 (*)   ? All other components within normal limits  ?RESP PANEL BY RT-PCR (FLU A&B, COVID) ARPGX2  ?CBC  ?BRAIN NATRIURETIC PEPTIDE  ?TROPONIN I (HIGH SENSITIVITY)  ? ? ? ?EKG ? ?ED ECG REPORT ?I, Lavonia Drafts, the attending physician, personally viewed and interpreted this ECG. ? ?Date: 12/06/2021 ? ?Rhythm: normal sinus  rhythm ?QRS Axis: normal ?Intervals: normal ?ST/T Wave abnormalities: normal ?Narrative Interpretation: no evidence of acute ischemia ? ? ? ?RADIOLOGY ?Chest x-ray reviewed by me, no evidence of pneumonia ? ? ? ?PROCEDURES: ? ?Critical Care performed: yes ? ?CRITICAL CARE ?Performed by: Lavonia Drafts ? ? ?Total critical care time: 30 minutes ? ?Critical care time was exclusive of separately billable procedures and treating other patients. ? ?Critical care was necessary to treat or prevent imminent or life-threatening deterioration. ? ?Critical care was time spent personally by me on the following activities: development of treatment plan with patient and/or surrogate as well as nursing, discussions with consultants, evaluation of patient's response to treatment, examination of patient, obtaining history from patient or surrogate, ordering and performing treatments and interventions, ordering and review of laboratory studies, ordering and review of radiographic studies, pulse oximetry and re-evaluation of patient's condition.  ? ?Procedures ? ? ?MEDICATIONS ORDERED IN ED: ?Medications  ?magnesium sulfate IVPB 2 g 50 mL (2 g Intravenous New Bag/Given 12/06/21 0830)  ?ipratropium-albuterol (DUONEB) 0.5-2.5 (  3) MG/3ML nebulizer solution 3 mL (3 mLs Nebulization Given 12/06/21 0831)  ? ? ? ?IMPRESSION / MDM / ASSESSMENT AND PLAN / ED COURSE  ?I reviewed the triage vital signs and the nursing notes. ? ?Patient presents with shortness of breath as detailed above.  HPI and exam consistent with COPD exacerbation.  He is hypoxic on room air, currently on 2 L nasal cannula maintaining saturations about 92 to 93%. ? ?Chest x-ray without evidence of pneumonia.  He has received 2 DuoNebs and IV Solu-Medrol ? ?Given additional DuoNeb as well as IV magnesium.  Patient will require admission given respiratory failure ? ? ?Discussed with Dr. Francine Graven of the hospitalist service, she will admit ? ? ?  ? ? ?FINAL CLINICAL IMPRESSION(S) / ED  DIAGNOSES  ? ?Final diagnoses:  ?COPD exacerbation ([place])  ?Acute on chronic respiratory failure with hypoxia (HCC)  ? ? ? ?Rx / DC Orders  ? ?ED Discharge Orders   ? ? None  ? ?  ? ? ? ?Note:  This document was prepared using Dragon voice recognition software and may include unintentional dictation errors. ?  ?Lavonia Drafts, MD ?12/06/21 [phone] ? ?

## 2021-12-06 NOTE — H&P (Signed)
?History and Physical  ? ? ?Patient: [name] HER:[id number] DOB: [birth year] ?DOA: 12/06/2021 ?DOS: the patient was seen and examined on 12/06/2021 ?PCP: Donnie Coffin, MD  ?Patient coming from: Home ? ?Chief Complaint:  ?Chief Complaint  ?Patient presents with  ? Shortness of Breath  ? ?HPI: NICOLAOS MITRANO is a 61 y.o. male with medical history significant for schizophrenia, COPD, nicotine dependence, hypertension who presents to the ER for evaluation of  shortness of breath over the last 1 month but worse on the day of admission.  Patient had been prescribed a course of antibiotic therapy for upper respiratory tract infection but did not complete the dose. ?He presents today for worsening shortness of breath associated with cough productive of green phlegm and wheezing.  He denies having any chest pain, no leg swelling, no orthopnea, no fever, no chills, no dizziness, no lightheadedness, no nausea, no vomiting, no abdominal pain, no changes in his bowel habits or urinary symptoms. ?EMS administered 2 DuoNebs and IV Solu-Medrol ?Patient had room air pulse oximetry in the high 80s and is currently on 2 L of oxygen maintaining pulse oximetry of about 92% ? ?Review of Systems: As mentioned in the history of present illness. All other systems reviewed and are negative. ?Past Medical History:  ?Diagnosis Date  ? Asthma   ? COPD (chronic obstructive pulmonary disease) ([place])   ? Depression   ? History of hiatal hernia   ? Hypertension   ? Pneumonia   ? Schizophrenia ([place])   ? Shortness of breath dyspnea   ? Stroke [facility])   ? Umbilical hernia   ? ?Past Surgical History:  ?Procedure Laterality Date  ? COLONOSCOPY WITH PROPOFOL N/A 09/21/2021  ? Procedure: COLONOSCOPY WITH PROPOFOL;  Surgeon: Lesly Rubenstein, MD;  Location: [facility] ENDOSCOPY;  Service: Endoscopy;  Laterality: N/A;  ? HERNIA REPAIR    ? INSERTION OF MESH N/A 12/18/2014  ? Procedure: INSERTION OF MESH;  Surgeon: Florene Glen, MD;  Location: [facility] ORS;  Service:  General;  Laterality: N/A;  ? SUPRA-UMBILICAL HERNIA  8/56/3149  ? Procedure: SUPRA-UMBILICAL HERNIA;  Surgeon: Florene Glen, MD;  Location: [facility] ORS;  Service: General;;  ? UMBILICAL HERNIA REPAIR N/A 12/18/2014  ? Procedure: HERNIA REPAIR UMBILICAL ADULT;  Surgeon: Florene Glen, MD;  Location: [facility] ORS;  Service: General;  Laterality: N/A;  ? ?Social History:  reports that he has been smoking cigarettes. He has a 22.50 pack-year smoking history. He has never used smokeless tobacco. He reports that he does not currently use alcohol after a past usage of about 75.0 standard drinks per week. He reports that he does not currently use drugs. ? ?No Known Allergies ? ?Family History  ?Problem Relation Age of Onset  ? Asthma Mother   ? Hypertension Father   ? ? ?Prior to Admission medications   ?Medication Sig Start Date End Date Taking? Authorizing Provider  ?acetaminophen (TYLENOL) 325 MG tablet Take 650 mg by mouth 2 (two) times daily as needed for mild pain.    [provider]  ?amantadine (SYMMETREL) 100 MG capsule Take 100 mg by mouth 2 (two) times daily.    [provider]  ?aspirin EC 81 MG tablet Take by mouth.    [provider]  ?atenolol (TENORMIN) 50 MG tablet Take 50 mg by mouth daily.  09/24/18   [provider]  ?budesonide-formoterol (SYMBICORT) 160-4.5 MCG/ACT inhaler Inhale 2 puffs into the lungs 2 (two) times daily.  [provider]  ?clopidogrel (PLAVIX) 75 MG tablet  09/27/18   [provider]  ?dextromethorphan-guaiFENesin (ROBITUSSIN-DM) 10-100 MG/5ML liquid Take 10 mLs by mouth every 6 (six) hours as needed for cough.    [provider]  ?diltiazem (CARDIZEM CD) 180 MG 24 hr capsule Take 180 mg by mouth daily.  10/02/18   [provider]  ?docusate sodium (COLACE) 100 MG capsule Take 100 mg by mouth 2 (two) times daily as needed for mild constipation.    [provider]  ?gabapentin (NEURONTIN) 100 MG capsule  Take 100 mg by mouth at bedtime.  02/11/19   [provider]  ?guaiFENesin (MUCINEX) 600 MG 12 hr tablet Take 600 mg by mouth 2 (two) times daily.    [provider]  ?hydrochlorothiazide (HYDRODIURIL) 12.5 MG tablet  02/11/19   [provider]  ?ibuprofen (ADVIL) 800 MG tablet Take 1 tablet (800 mg total) by mouth every 8 (eight) hours as needed. 12/01/20   Naaman Plummer, MD  ?Lorayne Bender TRINZA 819 MG/2.625ML SUSY Inject 819 mg into the muscle every 3 (three) months.  10/02/18   [provider]  ?ipratropium-albuterol (DUONEB) 0.5-2.5 (3) MG/3ML SOLN Take 3 mLs by nebulization every 6 (six) hours as needed (every 4 to 6 hours as needed for shortnes of breath or wheeziing).     [provider]  ?lovastatin (MEVACOR) 20 MG tablet Take 20 mg by mouth at bedtime.    [provider]  ?Melatonin 5 MG TABS Take 1 tablet by mouth at bedtime as needed.    [provider]  ?mirtazapine (REMERON) 30 MG tablet Take 30 mg by mouth at bedtime.    [provider]  ?Multiple Vitamin (THEREMS PO) Take by mouth daily.    [provider]  ?Omega-3 Fatty Acids (FISH OIL) 1000 MG CPDR Take by mouth.    [provider]  ?potassium chloride SA (K-DUR) 20 MEQ tablet  02/11/19   [provider]  ?Pyridoxine HCl (B-6) 100 MG TABS Take by mouth daily.    [provider]  ?QUEtiapine (SEROQUEL) 200 MG tablet Take 200 mg by mouth at bedtime.    [provider]  ?tiotropium (SPIRIVA) 18 MCG inhalation capsule Place 1 capsule (18 mcg total) into inhaler and inhale daily. 01/22/17   Bettey Costa, MD  ?VENTOLIN HFA 108 (90 Base) MCG/ACT inhaler Inhale 2 puffs into the lungs every 4 (four) hours as needed. 04/24/20   [provider]  ?VITAMIN D, ERGOCALCIFEROL, PO Take 1,000 Units by mouth daily.     [provider]  ? ? ?Physical Exam: ?Vitals:  ? 12/06/21 0753 12/06/21 0755  ?BP: (!) 123/97   ?Pulse: 98   ?Resp: (!) 27    ?Temp: 98.3 ?F (36.8 ?C)   ?TempSrc: Oral   ?SpO2: 90%   ?Weight:  110.2 kg  ?Height:  6\' 1"  (1.854 m)  ? ?Physical Exam ?Vitals and nursing note reviewed.  ?Constitutional:   ?   Appearance: He is well-developed.  ?HENT:  ?   Head: Normocephalic and atraumatic.  ?   Mouth/Throat:  ?   Mouth: Mucous membranes are moist.  ?Eyes:  ?   Pupils: Pupils are equal, round, and reactive to light.  ?Cardiovascular:  ?   Rate and Rhythm: Normal rate and regular rhythm.  ?Pulmonary:  ?   Effort: Tachypnea present.  ?   Breath sounds: Examination of the right-upper field reveals wheezing. Examination of the left-upper field reveals  wheezing. Examination of the right-middle field reveals wheezing. Examination of the left-middle field reveals wheezing. Examination of the right-lower field reveals wheezing. Examination of the left-lower field reveals wheezing. Wheezing present.  ?   Comments: Coarse rhonchi in all lung fields ?Abdominal:  ?   General: Bowel sounds are normal.  ?   Palpations: Abdomen is soft.  ?Musculoskeletal:     ?   General: Normal range of motion.  ?   Cervical back: Normal range of motion and neck supple.  ?Skin: ?   General: Skin is warm and dry.  ?Neurological:  ?   General: No focal deficit present.  ?   Mental Status: He is alert and oriented to person, place, and time.  ?Psychiatric:     ?   Mood and Affect: Mood normal.     ?   Behavior: Behavior normal.  ? ? ?Data Reviewed: ?Relevant notes from primary care and specialist visits, past discharge summaries as available in EHR, including Care Everywhere. ?Prior diagnostic testing as pertinent to current admission diagnoses ?Updated medications and problem lists for reconciliation ?ED course, including vitals, labs, imaging, treatment and response to treatment ?Triage notes, nursing and pharmacy notes and ED provider's notes ?Notable results as noted in HPI ?Labs reviewed.  BNP 26.3, troponin 7, sodium 136, potassium 3.6, chloride 101, bicarb 25, glucose  108, BUN 11, creatinine 0.76, calcium 9.4, total protein 7.6, albumin 4.1, AST 29, ALT 29, white count 10.1, hemoglobin 13.8, hematocrit 41.4, MCV 90.8, RDW 13.5, platelet count 180 ?Respiratory viral panel i

## 2021-12-06 NOTE — Progress Notes (Signed)
PHARMACIST - PHYSICIAN COMMUNICATION ? ?CONCERNING:  Enoxaparin (Lovenox) for DVT Prophylaxis  ? ? ?RECOMMENDATION: ?Patient was prescribed enoxaprin 40mg  q24 hours for VTE prophylaxis.  ? Danley Danker Weights  ? 12/06/21 0755  ?Weight: 110.2 kg (243 lb)  ? ? ?Body mass index is 32.06 kg/m?. ? ?Estimated Creatinine Clearance: 123 mL/min (by C-G formula based on SCr of 0.76 mg/dL). ? ? ?Based on [place] patient is candidate for enoxaparin 0.5mg /kg TBW SQ every 24 hours based on BMI being >30. ? ? ?DESCRIPTION: ?Pharmacy has adjusted enoxaparin dose per [facility] policy. ? ?Patient is now receiving enoxaparin 55 mg every 24 hours  ? ? ?Pernell Dupre, PharmD, BCPS ?Clinical Pharmacist ?12/06/2021 ?10:01 AM ? ? ?

## 2021-12-06 NOTE — ED Triage Notes (Signed)
Patient arrived by EMS from group home - [facility]. HX COPD. Reports increased sob x1 month. Prescribed antibiotics 1 month ago for URI but did not finish. Reports productive cough-green. EMS administered 2 duonebs and 125 solumedrol ?

## 2021-12-06 NOTE — Assessment & Plan Note (Signed)
Smoking cessation has been discussed with patient in detail ?We will place patient on a nicotine transdermal patch 14 mg daily ?

## 2021-12-06 NOTE — Assessment & Plan Note (Signed)
Patient has a history of schizophrenia ?Patient is on Invega every 3 months ?Continue Seroquel and mirtazapine ? ?

## 2021-12-07 DIAGNOSIS — J441 Chronic obstructive pulmonary disease with (acute) exacerbation: Secondary | ICD-10-CM | POA: Diagnosis not present

## 2021-12-07 LAB — CBC
HCT: 41 % (ref 39.0–52.0)
Hemoglobin: 13.4 g/dL (ref 13.0–17.0)
MCH: 29.9 pg (ref 26.0–34.0)
MCHC: 32.7 g/dL (ref 30.0–36.0)
MCV: 91.5 fL (ref 80.0–100.0)
Platelets: 187 10*3/uL (ref 150–400)
RBC: 4.48 MIL/uL (ref 4.22–5.81)
RDW: 13.2 % (ref 11.5–15.5)
WBC: 7.5 10*3/uL (ref 4.0–10.5)
nRBC: 0 % (ref 0.0–0.2)

## 2021-12-07 LAB — BASIC METABOLIC PANEL
Anion gap: 13 (ref 5–15)
BUN: 14 mg/dL (ref 8–23)
CO2: 25 mmol/L (ref 22–32)
Calcium: 9.1 mg/dL (ref 8.9–10.3)
Chloride: 99 mmol/L (ref 98–111)
Creatinine, Ser: 0.73 mg/dL (ref 0.61–1.24)
GFR, Estimated: >60 mL/min (ref >60)
Glucose, Bld: 125 mg/dL — ABNORMAL HIGH (ref 70–99)
Potassium: 4 mmol/L (ref 3.5–5.1)
Sodium: 137 mmol/L (ref 135–145)

## 2021-12-07 MED ORDER — METHYLPREDNISOLONE SODIUM SUCC 40 MG IJ SOLR
40.0000 mg | Freq: Two times a day (BID) | INTRAMUSCULAR | Status: DC
  Administered 2021-12-07 – 2021-12-13 (×12): 40 mg via INTRAVENOUS
  Filled 2021-12-07 (×12): qty 1

## 2021-12-07 MED ORDER — PANTOPRAZOLE SODIUM 20 MG PO TBEC
20.0000 mg | DELAYED_RELEASE_TABLET | Freq: Every day | ORAL | Status: DC
  Administered 2021-12-07 – 2021-12-08 (×2): 20 mg via ORAL
  Filled 2021-12-07 (×2): qty 1

## 2021-12-07 NOTE — Progress Notes (Signed)
?PROGRESS NOTE ? ? ? ?[name]  RUE:[id number] DOB: 18-Oct-1960 DOA: 12/06/2021 ?PCP: Donnie Coffin, MD  ? ? ?Brief Narrative:  ?[name] is a 61 y.o. male with medical history significant for schizophrenia, COPD, nicotine dependence, hypertension who presents to the ER for evaluation of  shortness of breath over the last 1 month but worse on the day of admission.  Patient had been prescribed a course of antibiotic therapy for upper respiratory tract infection but did not complete the dose. ?He presents today for worsening shortness of breath associated with cough productive of green phlegm and wheezing.  He denies having any chest pain, no leg swelling, no orthopnea, no fever, no chills, no dizziness, no lightheadedness, no nausea, no vomiting, no abdominal pain, no changes in his bowel habits or urinary symptoms. ?EMS administered 2 DuoNebs and IV Solu-Medrol ?Patient had room air pulse oximetry in the high 80s and is currently on 2 L of oxygen maintaining pulse oximetry of about 92% ? ?5/2 shortness of breath about the same.  Not much change.  No chest pain.  No coughing. ? ?Consultants:  ? ? ?Procedures:  ? ?Antimicrobials:  ?Cefepime ? ? ?Subjective: ?No chest pain no dizziness ? ?Objective: ?Vitals:  ? 12/06/21 2019 12/06/21 2020 12/07/21 1478 12/07/21 2956  ?BP:  (!) 131/94 122/87 (!) 141/92  ?Pulse:  95 88 93  ?Resp:  20 18 (!) 21  ?Temp:  98 ?F (36.7 ?C) 98 ?F (36.7 ?C) 97.9 ?F (36.6 ?C)  ?TempSrc:  Oral  Oral  ?SpO2: 90% (!) 89% 93% 93%  ?Weight:      ?Height:      ? ? ?Intake/Output Summary (Last 24 hours) at 12/07/2021 1442 ?Last data filed at 12/07/2021 2130 ?Gross per 24 hour  ?Intake 457 ml  ?Output 1675 ml  ?Net -1218 ml  ? ?Filed Weights  ? 12/06/21 0755  ?Weight: 110.2 kg  ? ? ?Examination: ?Calm, NAD ?Coarse rhonchorous breath sounds, minimal wheezing  ?reg s1/s2 no gallop ?Soft benign +bs ?No edema ?Aaoxox3  ?Mood and affect appropriate in current setting  ? ? ? ?Data Reviewed: I have personally  reviewed following labs and imaging studies ? ?CBC: ?Recent Labs  ?Lab 12/06/21 ?0802 12/07/21 ?0512  ?WBC 10.1 7.5  ?HGB 13.8 13.4  ?HCT 41.4 41.0  ?MCV 90.8 91.5  ?PLT 180 187  ? ?Basic Metabolic Panel: ?Recent Labs  ?Lab 12/06/21 ?0802 12/07/21 ?0512  ?NA 136 137  ?K 3.6 4.0  ?CL 101 99  ?CO2 25 25  ?GLUCOSE 108* 125*  ?BUN 11 14  ?CREATININE 0.76 0.73  ?CALCIUM 9.4 9.1  ? ?GFR: ?Estimated Creatinine Clearance: 123 mL/min (by C-G formula based on SCr of 0.73 mg/dL). ?Liver Function Tests: ?Recent Labs  ?Lab 12/06/21 ?0802  ?AST 29  ?ALT 29  ?ALKPHOS 62  ?BILITOT 0.6  ?PROT 7.6  ?ALBUMIN 4.1  ? ?No results for input(s): LIPASE, AMYLASE in the last 168 hours. ?No results for input(s): AMMONIA in the last 168 hours. ?Coagulation Profile: ?No results for input(s): INR, PROTIME in the last 168 hours. ?Cardiac Enzymes: ?No results for input(s): CKTOTAL, CKMB, CKMBINDEX, TROPONINI in the last 168 hours. ?BNP (last 3 results) ?No results for input(s): PROBNP in the last 8760 hours. ?HbA1C: ?No results for input(s): HGBA1C in the last 72 hours. ?CBG: ?No results for input(s): GLUCAP in the last 168 hours. ?Lipid Profile: ?No results for input(s): CHOL, HDL, LDLCALC, TRIG, CHOLHDL, LDLDIRECT in the last 72 hours. ?Thyroid  Function Tests: ?No results for input(s): TSH, T4TOTAL, FREET4, T3FREE, THYROIDAB in the last 72 hours. ?Anemia Panel: ?No results for input(s): VITAMINB12, FOLATE, FERRITIN, TIBC, IRON, RETICCTPCT in the last 72 hours. ?Sepsis Labs: ?No results for input(s): PROCALCITON, LATICACIDVEN in the last 168 hours. ? ?Recent Results (from the past 240 hour(s))  ?Resp Panel by RT-PCR (Flu A&B, Covid) Nasopharyngeal Swab     Status: None  ? Collection Time: 12/06/21  8:33 AM  ? Specimen: Nasopharyngeal Swab; Nasopharyngeal(NP) swabs in vial transport medium  ?Result Value Ref Range Status  ? SARS Coronavirus 2 by RT PCR NEGATIVE NEGATIVE Final  ?  Comment: (NOTE) ?SARS-CoV-2 target nucleic acids are NOT  DETECTED. ? ?The SARS-CoV-2 RNA is generally detectable in upper respiratory ?specimens during the acute phase of infection. The lowest ?concentration of SARS-CoV-2 viral copies this assay can detect is ?138 copies/mL. A negative result does not preclude SARS-Cov-2 ?infection and should not be used as the sole basis for treatment or ?other patient management decisions. A negative result may occur with  ?improper specimen collection/handling, submission of specimen other ?than nasopharyngeal swab, presence of viral mutation(s) within the ?areas targeted by this assay, and inadequate number of viral ?copies(<138 copies/mL). A negative result must be combined with ?clinical observations, patient history, and epidemiological ?information. The expected result is Negative. ? ?Fact Sheet for Patients:  ?EntrepreneurPulse.com.au ? ?Fact Sheet for Healthcare Providers:  ?IncredibleEmployment.be ? ?This test is no t yet approved or cleared by the [place] FDA and  ?has been authorized for detection and/or diagnosis of SARS-CoV-2 by ?FDA under an Emergency Use Authorization (EUA). This EUA will remain  ?in effect (meaning this test can be used) for the duration of the ?COVID-19 declaration under Section 564(b)(1) of the Act, 21 ?U.S.C.section 360bbb-3(b)(1), unless the authorization is terminated  ?or revoked sooner.  ? ? ?  ? Influenza A by PCR NEGATIVE NEGATIVE Final  ? Influenza B by PCR NEGATIVE NEGATIVE Final  ?  Comment: (NOTE) ?The Xpert Xpress SARS-CoV-2/FLU/RSV plus assay is intended as an aid ?in the diagnosis of influenza from Nasopharyngeal swab specimens and ?should not be used as a sole basis for treatment. Nasal washings and ?aspirates are unacceptable for Xpert Xpress SARS-CoV-2/FLU/RSV ?testing. ? ?Fact Sheet for Patients: ?EntrepreneurPulse.com.au ? ?Fact Sheet for Healthcare Providers: ?IncredibleEmployment.be ? ?This test is not yet  approved or cleared by the [place] FDA and ?has been authorized for detection and/or diagnosis of SARS-CoV-2 by ?FDA under an Emergency Use Authorization (EUA). This EUA will remain ?in effect (meaning this test can be used) for the duration of the ?COVID-19 declaration under Section 564(b)(1) of the Act, 21 U.S.C. ?section 360bbb-3(b)(1), unless the authorization is terminated or ?revoked. ? ?Performed at [facility], [place], ?[place] [address] ?  ?Respiratory (~20 pathogens) panel by PCR     Status: Abnormal  ? Collection Time: 12/06/21 10:07 AM  ? Specimen: Nasopharyngeal Swab; Respiratory  ?Result Value Ref Range Status  ? Adenovirus NOT DETECTED NOT DETECTED Final  ? Coronavirus 229E NOT DETECTED NOT DETECTED Final  ?  Comment: (NOTE) ?The Coronavirus on the Respiratory Panel, DOES NOT test for the novel  ?Coronavirus (2019 nCoV) ?  ? Coronavirus HKU1 NOT DETECTED NOT DETECTED Final  ? Coronavirus NL63 NOT DETECTED NOT DETECTED Final  ? Coronavirus OC43 NOT DETECTED NOT DETECTED Final  ? Metapneumovirus NOT DETECTED NOT DETECTED Final  ? Rhinovirus / Enterovirus DETECTED (A) NOT DETECTED Final  ? Influenza A  NOT DETECTED NOT DETECTED Final  ? Influenza B NOT DETECTED NOT DETECTED Final  ? Parainfluenza Virus 1 NOT DETECTED NOT DETECTED Final  ? Parainfluenza Virus 2 NOT DETECTED NOT DETECTED Final  ? Parainfluenza Virus 3 NOT DETECTED NOT DETECTED Final  ? Parainfluenza Virus 4 NOT DETECTED NOT DETECTED Final  ? Respiratory Syncytial Virus NOT DETECTED NOT DETECTED Final  ? Bordetella pertussis NOT DETECTED NOT DETECTED Final  ? Bordetella Parapertussis NOT DETECTED NOT DETECTED Final  ? Chlamydophila pneumoniae NOT DETECTED NOT DETECTED Final  ? Mycoplasma pneumoniae NOT DETECTED NOT DETECTED Final  ?  Comment: Performed at [facility], [place] [address] [place], [place] [address]  ?  ? ? ? ? ? ?Radiology Studies: ?DG Chest Port 1 View ? ?Result Date: 12/06/2021 ?CLINICAL  DATA:  Worsened shortness of breath over the last month. History of asthma and COPD. EXAM: PORTABLE CHEST 1 VIEW COMPARISON:  Radiography 04/19/2020.  CT 10/06/2020. FINDINGS: Heart size is normal. Mediastinal s

## 2021-12-08 ENCOUNTER — Inpatient Hospital Stay: Payer: Medicare Other

## 2021-12-08 DIAGNOSIS — J441 Chronic obstructive pulmonary disease with (acute) exacerbation: Secondary | ICD-10-CM | POA: Diagnosis not present

## 2021-12-08 DIAGNOSIS — F17200 Nicotine dependence, unspecified, uncomplicated: Secondary | ICD-10-CM | POA: Diagnosis not present

## 2021-12-08 DIAGNOSIS — J9621 Acute and chronic respiratory failure with hypoxia: Secondary | ICD-10-CM | POA: Diagnosis not present

## 2021-12-08 DIAGNOSIS — I10 Essential (primary) hypertension: Secondary | ICD-10-CM

## 2021-12-08 DIAGNOSIS — F203 Undifferentiated schizophrenia: Secondary | ICD-10-CM

## 2021-12-08 LAB — MAGNESIUM: Magnesium: 2.3 mg/dL (ref 1.7–2.4)

## 2021-12-08 MED ORDER — IOHEXOL 300 MG/ML  SOLN
75.0000 mL | Freq: Once | INTRAMUSCULAR | Status: AC | PRN
  Administered 2021-12-08: 75 mL via INTRAVENOUS

## 2021-12-08 MED ORDER — IPRATROPIUM-ALBUTEROL 0.5-2.5 (3) MG/3ML IN SOLN
3.0000 mL | Freq: Three times a day (TID) | RESPIRATORY_TRACT | Status: DC
  Administered 2021-12-09 – 2021-12-14 (×16): 3 mL via RESPIRATORY_TRACT
  Filled 2021-12-08 (×17): qty 3

## 2021-12-08 NOTE — Progress Notes (Signed)
?PROGRESS NOTE ? ?[name]    DOB: [birth year], 61 y.o.  ?KDX:[id number]  ?  Code Status: Full Code   ?DOA: 12/06/2021   LOS: 2  ? ?Brief hospital course  ?[name] is a 61 y.o. male with a PMH significant for schizophrenia, COPD, nicotine dependence, HTN. ? ?They presented from home to the ED on 12/06/2021 with SOB x 1 days. Endorsed productive cough and DOE. Prescribed course of Abx outpatient for upper respiratory tract infection prior to presentation but did not complete. ? ?In the ED, it was found that they had hypoxia on room air at rest with O2 saturations in high 80s and improved on 2L O2. His other vital signs remained stable.  ?Significant findings included chest xray without acute process identified but evidence of chronic emphysema. ? ?They were initially treated with guaifensin, duonebs, IV methoprednisolone, CTX, magnesium.  ?Patient was admitted to medicine service for further workup and management of COPD exacerbation as outlined in detail below. ? ?12/08/21 -stable, improved ? ?Assessment & Plan  ?Principal Problem: ?  COPD exacerbation ([place]) ?Active Problems: ?  Essential hypertension ?  Schizophrenia ([place]) ?  Nicotine dependence ? ?Acute hypoxic respiratory failure  COPD  tobacco use-no oxygen use at baseline.  Patient required up to 3 L O2 supplementation this admission and has improved to 2 L today.  Continues to have significant adventitious lung sounds.  Respiratory panel positive for rhino/enterovirus. ?-Wean oxygen as tolerated ?-Continue DuoNebs every 6 hours and as needed ?-Continue IV steroids ?-Continue ICS/SABA ?-Continue Abx ?-Chest CT as patient still has significant symptoms, oxygen dependence and x-ray was negative for acute disease.  Low suspicion for PE ?-Nicotine replacement PRN ? ?Schizophrenia-chronic, stable ?-Continue home Seroquel and mirtazapine ? ?HTN-chronic, well controlled on home therapies. ?-Discontinue home atenolol to avoid contribution to respiratory  exacerbation ?-Continue home HCTZ and Cardizem ? ?Body mass index is 32.06 kg/m?. ? ?VTE ppx:  ? ? ?Diet:  ?   ?Diet  ? Diet 2 gram sodium Room service appropriate? Yes; Fluid consistency: Thin  ? ?Consultants: ?None ?Subjective 12/08/21   ? ?Pt reports feeling mildly improved today.  Continues to have significant dyspnea on exertion. ?  ?Objective  ? ?Vitals:  ? 12/08/21 0003 12/08/21 9767 12/08/21 0750 12/08/21 0933  ?BP: 121/87 108/72 123/87 (!) 131/92  ?Pulse: 92 76 79 63  ?Resp: 18 18 20    ?Temp: 98 ?F (36.7 ?C) 97.8 ?F (36.6 ?C) (!) 97.5 ?F (36.4 ?C)   ?TempSrc: Oral Oral Oral   ?SpO2: 94% 92% 92%   ?Weight:      ?Height:      ? ? ?Intake/Output Summary (Last 24 hours) at 12/08/2021 1050 ?Last data filed at 12/08/2021 3419 ?Gross per 24 hour  ?Intake 553 ml  ?Output 1500 ml  ?Net -947 ml  ? ?Filed Weights  ? 12/06/21 0755  ?Weight: 110.2 kg  ?  ? ?Physical Exam:  ?General: awake, alert, mild distress ?HEENT: atraumatic, clear conjunctiva, anicteric sclera, MMM, hearing grossly normal ?Respiratory: Increased respiratory effort without accessory muscle use.  Audible wheezing and rhonchi and stertor which can be heard while standing from across the room from patient ?Cardiovascular: quick capillary refill, normal S1/S2, RRR, no JVD, murmurs ?Nervous: A&O x3. no gross focal neurologic deficits, normal speech ?Extremities: moves all equally, no edema, normal tone ?Skin: dry, intact, normal temperature, normal color. No rashes, lesions or ulcers on exposed skin ?Psychiatry: Anxious ? ?Labs   ?I have personally reviewed the following  labs and imaging studies ?CBC ?   ?Component Value Date/Time  ? WBC 7.5 12/07/2021 0512  ? RBC 4.48 12/07/2021 0512  ? HGB 13.4 12/07/2021 0512  ? HGB 13.0 12/27/2013 2033  ? HCT 41.0 12/07/2021 0512  ? HCT 38.7 (L) 12/27/2013 2033  ? PLT 187 12/07/2021 0512  ? PLT 198 12/27/2013 2033  ? MCV 91.5 12/07/2021 0512  ? MCV 95 12/27/2013 2033  ? MCH 29.9 12/07/2021 0512  ? MCHC 32.7 12/07/2021  0512  ? RDW 13.2 12/07/2021 0512  ? RDW 14.4 12/27/2013 2033  ? LYMPHSABS 2.6 04/12/2020 1815  ? LYMPHSABS 1.2 08/21/2013 0456  ? MONOABS 0.6 04/12/2020 1815  ? MONOABS 0.5 08/21/2013 0456  ? EOSABS 0.0 04/12/2020 1815  ? EOSABS 0.0 08/21/2013 0456  ? BASOSABS 0.0 04/12/2020 1815  ? BASOSABS 0.0 08/21/2013 0456  ? ? ?  Latest Ref Rng & Units 12/07/2021  ?  5:12 AM 12/06/2021  ?  8:02 AM 04/20/2020  ?  6:21 AM  ?BMP  ?Glucose 70 - 99 mg/dL 125   108   105    ?BUN 8 - 23 mg/dL 14   11   15     ?Creatinine 0.61 - 1.24 mg/dL 0.73   0.76   0.47    ?Sodium 135 - 145 mmol/L 137   136   140    ?Potassium 3.5 - 5.1 mmol/L 4.0   3.6   3.1    ?Chloride 98 - 111 mmol/L 99   101   101    ?CO2 22 - 32 mmol/L 25   25   31     ?Calcium 8.9 - 10.3 mg/dL 9.1   9.4   8.6    ? ? ?No results found. ? ?Disposition Plan & Communication  ?Patient status: Inpatient  ?Admitted From: Home ?Planned disposition location: Home ?Anticipated discharge date: 5/5 pending improved respiratory status ? ?Family Communication: None ?  ?Author: ?Richarda Osmond, DO ?[facility] ?12/08/2021, 10:50 AM  ? ?Available by Epic secure chat 7AM-7PM. ?If 7PM-7AM, please contact night-coverage.  ?[facility] contact information found on [email or web address]. ? ?

## 2021-12-09 DIAGNOSIS — J441 Chronic obstructive pulmonary disease with (acute) exacerbation: Secondary | ICD-10-CM | POA: Diagnosis not present

## 2021-12-09 MED ORDER — FAMOTIDINE 20 MG PO TABS
20.0000 mg | ORAL_TABLET | Freq: Every day | ORAL | Status: DC
  Administered 2021-12-09 – 2021-12-14 (×6): 20 mg via ORAL
  Filled 2021-12-09 (×6): qty 1

## 2021-12-09 MED ORDER — PROCHLORPERAZINE EDISYLATE 10 MG/2ML IJ SOLN
10.0000 mg | Freq: Four times a day (QID) | INTRAMUSCULAR | Status: DC | PRN
  Administered 2021-12-09: 10 mg via INTRAVENOUS
  Filled 2021-12-09 (×2): qty 2

## 2021-12-09 NOTE — Progress Notes (Addendum)
?PROGRESS NOTE ? ?[name]    DOB: [birth year], 61 y.o.  ?[id number]  ?  Code Status: Full Code   ?DOA: 12/06/2021   LOS: 3  ? ?Brief hospital course  ?[name] is a 61 y.o. male with a PMH significant for schizophrenia, COPD, nicotine dependence, HTN. ? ?They presented from home to the ED on 12/06/2021 with SOB x 1 days. Endorsed productive cough and DOE. Prescribed course of Abx outpatient for upper respiratory tract infection prior to presentation but did not complete. ? ?In the ED, it was found that they had hypoxia on room air at rest with O2 saturations in high 80s and improved on 2L O2. His other vital signs remained stable.  ?Significant findings included chest xray without acute process identified but evidence of chronic emphysema. ? ?They were initially treated with guaifensin, duonebs, IV methoprednisolone, CTX, magnesium.  ?Patient was admitted to medicine service for further workup and management of COPD exacerbation as outlined in detail below. ? ?12/09/21 -stable, improved ? ?Assessment & Plan  ?Principal Problem: ?  COPD exacerbation ([place]) ?Active Problems: ?  Essential hypertension ?  Schizophrenia ([place]) ?  Nicotine dependence ?  Acute on chronic respiratory failure with hypoxia ([facility]) ? ?Acute hypoxic respiratory failure  COPD  tobacco use-no oxygen use at baseline.  Patient required up to 3 L O2 supplementation this admission and has improved to 2 L today.  Continues to have significant adventitious lung sounds.  Respiratory panel positive for rhino/enterovirus. ?-Wean oxygen as tolerated ?-Continue DuoNebs every 6 hours and as needed ?-Continue IV steroids ?-Continue ICS/SABA ?-Continue Abx ?-Chest CT as patient still has significant symptoms, oxygen dependence and x-ray was negative for acute disease.  Low suspicion for PE ?-Nicotine replacement PRN ?-Pulmonology consulted today ? ?Right upper lobe lung nodule -seen on CT chest 5/3.  Irregular solid pulmonary nodule of the right upper  lobe measuring 1.0 x 0.5 cm, slightly increased from previous size of 0.6 x 0.5 cm ?--Pulmonology consulted ? ?Schizophrenia-chronic, stable ?-Continue home Seroquel and mirtazapine ? ?HTN-chronic, well controlled on home therapies. ?-Discontinue home atenolol to avoid contribution to respiratory exacerbation ?-Continue home HCTZ and Cardizem ? ?Body mass index is 32.06 kg/m?. ? ?VTE ppx:  ? ? ?Diet:  ?   ?Diet  ? Diet regular Room service appropriate? Yes; Fluid consistency: Thin  ? ?Consultants: ?Pulmonology ?Subjective 12/09/21   ? ?Pt reports overall feeling better but having nausea and vomiting this morning.  Denies any abdominal pain however.  Denies fevers or chills.  Reports breathing is improved but still more short of breath and usual. ?  ?Objective  ? ?Vitals:  ? 12/08/21 2028 12/08/21 2110 12/09/21 0428 12/09/21 0747  ?BP:  (!) 144/90 117/90 (!) 114/91  ?Pulse:  85 80 80  ?Resp:   20 17  ?Temp:   98.3 ?F (36.8 ?C) 97.7 ?F (36.5 ?C)  ?TempSrc:   Oral   ?SpO2: 91%  94% 94%  ?Weight:      ?Height:      ? ? ?Intake/Output Summary (Last 24 hours) at 12/09/2021 1404 ?Last data filed at 12/09/2021 0746 ?Gross per 24 hour  ?Intake --  ?Output 875 ml  ?Net -875 ml  ? ?Filed Weights  ? 12/06/21 0755  ?Weight: 110.2 kg  ?  ? ?Physical Exam:  ?General: awake sitting up in bed, alert, mild distress ?HEENT: Hearing grossly normal, moist mucous membranes ?Respiratory: Diffuse expiratory wheezes, tachypneic, slight accessory muscle use 3 L/min [place] O2 ?Cardiovascular:  RRR, no peripheral edema ?Nervous: A&O x3. no gross focal neurologic deficits, normal speech ?Extremities: moves all equally, no edema, normal tone ?Skin: dry, intact, normal temperature ?Psychiatry: Normal mood, congruent affect ? ?Labs   ?I have personally reviewed the following labs and imaging studies ?CBC ?   ?Component Value Date/Time  ? WBC 7.5 12/07/2021 0512  ? RBC 4.48 12/07/2021 0512  ? HGB 13.4 12/07/2021 0512  ? HGB 13.0 12/27/2013 2033  ? HCT 41.0  12/07/2021 0512  ? HCT 38.7 (L) 12/27/2013 2033  ? PLT 187 12/07/2021 0512  ? PLT 198 12/27/2013 2033  ? MCV 91.5 12/07/2021 0512  ? MCV 95 12/27/2013 2033  ? MCH 29.9 12/07/2021 0512  ? MCHC 32.7 12/07/2021 0512  ? RDW 13.2 12/07/2021 0512  ? RDW 14.4 12/27/2013 2033  ? LYMPHSABS 2.6 04/12/2020 1815  ? LYMPHSABS 1.2 08/21/2013 0456  ? MONOABS 0.6 04/12/2020 1815  ? MONOABS 0.5 08/21/2013 0456  ? EOSABS 0.0 04/12/2020 1815  ? EOSABS 0.0 08/21/2013 0456  ? BASOSABS 0.0 04/12/2020 1815  ? BASOSABS 0.0 08/21/2013 0456  ? ? ?  Latest Ref Rng & Units 12/07/2021  ?  5:12 AM 12/06/2021  ?  8:02 AM 04/20/2020  ?  6:21 AM  ?BMP  ?Glucose 70 - 99 mg/dL 125   108   105    ?BUN 8 - 23 mg/dL 14   11   15     ?Creatinine 0.61 - 1.24 mg/dL 0.73   0.76   0.47    ?Sodium 135 - 145 mmol/L 137   136   140    ?Potassium 3.5 - 5.1 mmol/L 4.0   3.6   3.1    ?Chloride 98 - 111 mmol/L 99   101   101    ?CO2 22 - 32 mmol/L 25   25   31     ?Calcium 8.9 - 10.3 mg/dL 9.1   9.4   8.6    ? ? ?CT CHEST W CONTRAST ? ?Result Date: 12/08/2021 ?CLINICAL DATA:  Concern for pneumonia EXAM: CT CHEST WITH CONTRAST TECHNIQUE: Multidetector CT imaging of the chest was performed during intravenous contrast administration. RADIATION DOSE REDUCTION: This exam was performed according to the departmental dose-optimization program which includes automated exposure control, adjustment of the mA and/or kV according to patient size and/or use of iterative reconstruction technique. CONTRAST:  62mL OMNIPAQUE IOHEXOL 300 MG/ML  SOLN COMPARISON:  CT chest dated October 06, 2020 FINDINGS: Cardiovascular: Normal heart size. Trace pericardial fluid. Normal caliber thoracic aorta with mild atherosclerotic disease. No suspicious filling defects of the central pulmonary arteries. Mediastinum/Nodes: Esophagus and thyroid are unremarkable. Mildly enlarged right lower paratracheal lymph node measuring 1.1 cm in short axis on series 2, image 73, likely reactive. Lungs/Pleura: Central  airways are patent. Bilateral right greater than left lower lung consolidations with a somewhat linear configuration. Irregular solid pulmonary nodule of the right upper lobe measuring 1.0 x 0.5 cm on series 3, image 60, previously measured 0.6 x 0.5 cm. Upper Abdomen: Bilateral adrenal nodules are unchanged in size when compared with prior exam, likely benign adenomas. Unchanged small left mild lipoma. Partially visualized low-attenuation lesion of the right kidney, likely a simple cyst. No acute abnormality. Musculoskeletal: No chest wall abnormality. No acute or significant osseous findings. IMPRESSION: 1. 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 Consultation with Pulmonology or Thoracic Surgery recommended.  2. Bibasilar consolidations with a somewhat linear configuration, favor resolving sequela of prior infection or aspiration. 3. Mildly enlarged right lower paratracheal lymph node, likely reactive. 4. Aortic Atherosclerosis (ICD10-I70.0) and Emphysema (ICD10-J43.9). Electronically Signed   By: Yetta Glassman M.D.   On: 12/08/2021 11:39   ? ?Disposition Plan & Communication  ?Patient status: Inpatient  ?Admitted From: Home ?Planned disposition location: Home ?Anticipated discharge date: 5/5 pending improved respiratory status ? ?Family Communication: None ?  ?Author: ?Ezekiel Slocumb, DO ?[facility] ?12/09/2021, 2:04 PM  ? ?Available by Epic secure chat 7AM-7PM. ?If 7PM-7AM, please contact night-coverage.  ?[facility] contact information found on [email or web address]. ? ?

## 2021-12-09 NOTE — Care Management Important Message (Signed)
Important Message ? ?Patient Details  ?Name: [name] ?MRN: [id number] ?Date of Birth: [birth year] ? ? ?Medicare Important Message Given:  Yes ? ? ? ? ?Juliann Pulse A Harvey Lingo ?12/09/2021, 11:43 AM ?

## 2021-12-09 NOTE — Consult Note (Signed)
? ? ? ?Pulmonary Medicine  ? ? ? ? ? ? ? ? ?Date: 12/09/2021,   ?MRN# [id number] [name] 1960-11-13 ? ? ?  ?AdmissionWeight: 110.2 kg                 ?CurrentWeight: 110.2 kg ? ? ? ? ? ?CHIEF COMPLAINT:  ? ?Abnormal chest ct scan ? ? ?HISTORY OF PRESENT ILLNESS  ? ?This is a 20 yr ol male phx of schizophrenia, htn, stroke, copd, still smoking here with copd exacerbation, slow turn around on appropriate regimen. As part of his w/u a chest was done.  ?impression  ?1. 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 Consultation with Pulmonology or ?Thoracic Surgery recommended. ?2. Bibasilar consolidations with a somewhat linear configuration, ?favor resolving sequela of prior infection or aspiration. ?3. Mildly enlarged right lower paratracheal lymph node, likely ?reactive. ?4. Aortic Atherosclerosis (ICD10-I70.0) and Emphysema (ICD10-J43.9). ? ? ?Electronically Signed ?  By: Yetta Glassman M.D. ?  On: 12/08/2021 11:39 ? ?10/07/20 chest ct ?Lungs/Pleura: Multiple small pulmonary nodules are noted in the ?lungs bilaterally, largest of which is in the right upper lobe ?(axial image 117 of series 3), with a volume derived mean diameter ?of 6.3 mm. No other larger more suspicious appearing pulmonary ?nodules or masses are noted. No acute consolidative airspace ?disease. No pleural effusions. Diffuse bronchial wall thickening ?with mild centrilobular and paraseptal emphysema. ?  ? ?PAST MEDICAL HISTORY  ? ?Past Medical History:  ?Diagnosis Date  ? Asthma   ? COPD (chronic obstructive pulmonary disease) ([place])   ? Depression   ? History of hiatal hernia   ? Hypertension   ? Pneumonia   ? Schizophrenia ([place])   ? Shortness of breath dyspnea   ? Stroke [facility])   ? Umbilical hernia   ? ? ? ?SURGICAL HISTORY  ? ?Past Surgical History:  ?Procedure Laterality Date  ? COLONOSCOPY WITH PROPOFOL N/A 09/21/2021   ? Procedure: COLONOSCOPY WITH PROPOFOL;  Surgeon: Lesly Rubenstein, MD;  Location: [facility] ENDOSCOPY;  Service: Endoscopy;  Laterality: N/A;  ? HERNIA REPAIR    ? INSERTION OF MESH N/A 12/18/2014  ? Procedure: INSERTION OF MESH;  Surgeon: Florene Glen, MD;  Location: [facility] ORS;  Service: General;  Laterality: N/A;  ? SUPRA-UMBILICAL HERNIA  7/67/3419  ? Procedure: SUPRA-UMBILICAL HERNIA;  Surgeon: Florene Glen, MD;  Location: [facility] ORS;  Service: General;;  ? UMBILICAL HERNIA REPAIR N/A 12/18/2014  ? Procedure: HERNIA REPAIR UMBILICAL ADULT;  Surgeon: Florene Glen, MD;  Location: [facility] ORS;  Service: General;  Laterality: N/A;  ? ? ? ?FAMILY HISTORY  ? ?Family History  ?Problem Relation Age of Onset  ? Asthma Mother   ? Hypertension Father   ? ? ? ?SOCIAL HISTORY  ? ?Social History  ? ?Tobacco Use  ? Smoking status: Every Day  ?  Packs/day: 0.50  ?  Years: 45.00  ?  Pack years: 22.50  ?  Types: Cigarettes  ? Smokeless tobacco: Never  ?Vaping Use  ? Vaping Use: Never used  ?Substance Use Topics  ? Alcohol use: Not Currently  ?  Alcohol/week: 75.0 standard drinks  ?  Types: 75 Cans of beer per week  ? Drug use: Not Currently  ? ? ? ?MEDICATIONS  ? ? ?Home Medication:  ?  ?Current Medication: ? ?Current Facility-Administered Medications:  ?  acetaminophen (TYLENOL) tablet 650 mg, 650 mg, Oral, BID PRN, [name], [name], MD ?  amantadine (SYMMETREL) capsule 100 mg, 100 mg, Oral, BID, [name], [name], MD, 100 mg at 12/09/21 1001 ?  ceFEPIme (MAXIPIME) 2 g in sodium chloride 0.9 % 100 mL IVPB, 2 g, Intravenous, Q8H, [name], [name], MD, Last Rate: 200 mL/hr at 12/09/21 1411, 2 g at 12/09/21 1411 ?  clopidogrel (PLAVIX) tablet 75 mg, 75 mg, Oral, Daily, [name], [name], MD, 75 mg at 12/09/21 1002 ?  diltiazem (CARDIZEM CD) 24 hr capsule 180 mg, 180 mg, Oral, Daily, [name], [name], MD, 180 mg at 12/09/21 1002 ?  enoxaparin (LOVENOX) injection 55 mg, 0.5 mg/kg, Subcutaneous, Q24H, Hallaji, Sheema M, RPH, 55 mg  at 12/08/21 2121 ?  famotidine (PEPCID) tablet 20 mg, 20 mg, Oral, Daily, Nicole Kindred A, DO, 20 mg at 12/09/21 1452 ?  gabapentin (NEURONTIN) capsule 100 mg, 100 mg, Oral, QHS, [name], [name], MD, 100 mg at 12/08/21 2123 ?  guaiFENesin (MUCINEX) 12 hr tablet 600 mg, 600 mg, Oral, BID, [name], [name], MD, 600 mg at 12/09/21 1002 ?  hydrochlorothiazide (HYDRODIURIL) tablet 12.5 mg, 12.5 mg, Oral, Daily, [name], [name], MD, 12.5 mg at 12/09/21 1002 ?  ipratropium-albuterol (DUONEB) 0.5-2.5 (3) MG/3ML nebulizer solution 3 mL, 3 mL, Nebulization, Q2H PRN, [name], [name], MD ?  ipratropium-albuterol (DUONEB) 0.5-2.5 (3) MG/3ML nebulizer solution 3 mL, 3 mL, Nebulization, TID, Doristine Mango L, MD, 3 mL at 12/09/21 1326 ?  melatonin tablet 5 mg, 5 mg, Oral, QHS PRN, [name], [name], MD, 5 mg at 12/08/21 2122 ?  methylPREDNISolone sodium succinate (SOLU-MEDROL) 40 mg/mL injection 40 mg, 40 mg, Intravenous, Q12H, Amery, Sahar, MD, 40 mg at 12/09/21 4008 ?  mirtazapine (REMERON) tablet 30 mg, 30 mg, Oral, QHS, [name], [name], MD, 30 mg at 12/08/21 2122 ?  mometasone-formoterol (DULERA) 200-5 MCG/ACT inhaler 2 puff, 2 puff, Inhalation, BID, [name], [name], MD, 2 puff at 12/09/21 1001 ?  multivitamin with minerals tablet, , Oral, Daily, [name], [name], MD, 1 tablet at 12/09/21 1002 ?  nicotine (NICODERM CQ - dosed in mg/24 hours) patch 14 mg, 14 mg, Transdermal, Daily, [name], [name], MD, 14 mg at 12/09/21 1002 ?  ondansetron (ZOFRAN) tablet 4 mg, 4 mg, Oral, Q6H PRN **OR** ondansetron (ZOFRAN) injection 4 mg, 4 mg, Intravenous, Q6H PRN, [name], [name], MD, 4 mg at 12/09/21 1012 ?  pravastatin (PRAVACHOL) tablet 20 mg, 20 mg, Oral, q1800, [name], [name], MD, 20 mg at 12/08/21 1722 ?  prochlorperazine (COMPAZINE) injection 10 mg, 10 mg, Intravenous, Q6H PRN, Nicole Kindred A, DO ?  pyridOXINE (VITAMIN B-6) tablet 100 mg, 100 mg, Oral, Daily, [name], [name], MD, 100 mg at 12/09/21 1002 ?  QUEtiapine  (SEROQUEL) tablet 200 mg, 200 mg, Oral, QHS, [name], [name], MD, 200 mg at 12/08/21 2124 ? ? ? ?ALLERGIES  ? ?Patient has no known allergies. ? ? ? ? ?REVIEW OF SYSTEMS  ? ? ?Review of Systems: ? ?Gen:  Denies  fever, sweats, chills weigh loss  ?HEENT: Denies blurred vision, double vision, ear pain, eye pain, hearing loss, nose bleeds, sore throat ?Cardiac:  No dizziness, chest pain or heaviness, chest tightness,edema ?Resp:   Denies cough or sputum porduction, shortness of breath,wheezing, hemoptysis,  ?Gi: Denies swallowing difficulty, stomach pain, nausea or vomiting, diarrhea, constipation, bowel incontinence ?Gu:  Denies bladder incontinence, burning urine ?Ext:   Denies Joint pain, stiffness or swelling ?Skin: Denies  skin rash, easy bruising or bleeding or hives ?Endoc:  Denies polyuria, polydipsia , polyphagia  or weight change ?Psych:   Denies depression, insomnia or hallucinations  ? ?Other:  All other systems negative ? ? ?VS: BP (!) 155/91 (BP Location: Right Arm)   Pulse 90   Temp 98 ?F (36.7 ?C)   Resp 18   Ht 6\' 1"  (1.854 m)   Wt 110.2 kg   SpO2 92%   BMI 32.06 kg/m?   ? ? ? ?PHYSICAL EXAM  ? ? ?GENERAL:NAD, no fevers, chills, no weakness no fatigue. Sitting up in bed comfortable ?HEAD: Normocephalic, atraumatic.  ?EYES: Pupils equal, round, reactive to light. Extraocular muscles intact. No scleral icterus.  ?MOUTH: Moist mucosal membrane. Dentition intact. No abscess noted.  ?EAR, NOSE, THROAT: Clear without exudates. No external lesions.  ?NECK: Supple. No thyromegaly. No nodules. No JVD.  ?PULMONARY: rare wheeze. No accessory muscles ?CARDIOVASCULAR: S1 and S2. Regular rate and rhythm. No murmurs, rubs, or gallops. No edema. Pedal pulses 2+ bilaterally.  ?GASTROINTESTINAL: Soft, nontender, nondistended. No masses. Positive bowel sounds. No hepatosplenomegaly.  ?MUSCULOSKELETAL: No swelling, clubbing, or edema. Range of motion full in all extremities.  ?NEUROLOGIC: Cranial nerves II through  XII are intact. No gross focal neurological deficits. Sensation intact. Reflexes intact.  ?SKIN: No ulceration, lesions, rashes, or cyanosis. Skin warm and dry. Turgor intact.  ?PSYCHIATRIC: Mood, affect wi

## 2021-12-10 DIAGNOSIS — J441 Chronic obstructive pulmonary disease with (acute) exacerbation: Secondary | ICD-10-CM | POA: Diagnosis not present

## 2021-12-10 MED ORDER — MONTELUKAST SODIUM 10 MG PO TABS
10.0000 mg | ORAL_TABLET | Freq: Every day | ORAL | Status: DC
  Administered 2021-12-10 – 2021-12-13 (×4): 10 mg via ORAL
  Filled 2021-12-10 (×4): qty 1

## 2021-12-10 NOTE — Evaluation (Signed)
Physical Therapy Evaluation ?Patient Details ?Name: [name] ?MRN: [id number] ?DOB: 1960/11/05 ?Today's Date: 12/10/2021 ? ?History of Present Illness ? [name] is a 61 y.o. male with a PMH significant for schizophrenia, COPD, nicotine dependence, HTN.  ?Clinical Impression ? Pt received in Semi-Fowler's position and agreeable to therapy.  Pt agreeable to ambulating in the room with therapist.  Pt performed well with all mobility and use of the IV pole.  Pt limited in distance due to the O2 cord, but able to ambulate and navigate the hospital room with ease.  Pt then requesting to return to the recliner for a short while.  Pt left with all needs met, chair alarm set, and call bell within reach.  Current discharge plans to group home without therapy remain appropriate at this time.  Pt will continue to benefit from skilled therapy in order to address deficits listed below. ? ?   ? ?Recommendations for follow up therapy are one component of a multi-disciplinary discharge planning process, led by the attending physician.  Recommendations may be updated based on patient status, additional functional criteria and insurance authorization. ? ?Follow Up Recommendations No PT follow up ? ?  ?Assistance Recommended at Discharge None  ?Patient can return home with the following ?   ? ?  ?Equipment Recommendations None recommended by PT  ?Recommendations for Other Services ?    ?  ?Functional Status Assessment Patient has had a recent decline in their functional status and demonstrates the ability to make significant improvements in function in a reasonable and predictable amount of time.  ? ?  ?Precautions / Restrictions Restrictions ?Weight Bearing Restrictions: No  ? ?  ? ?Mobility ? Bed Mobility ?Overal bed mobility: Modified Independent ?  ?  ?  ?  ?  ?  ?  ?  ? ?Transfers ?Overall transfer level: Modified independent ?Equipment used: None ?  ?  ?  ?  ?  ?  ?  ?General transfer comment: Pt transfers with good  technique, not requiring anything for mobility. ?  ? ?Ambulation/Gait ?Ambulation/Gait assistance: Modified independent (Device/Increase time) ?Gait Distance (Feet): 100 Feet ?Assistive device: IV Pole ?Gait Pattern/deviations: WFL(Within Functional Limits) ?Gait velocity: decreased ?  ?  ?General Gait Details: Pt with good mobility in the room with IV as support system. ? ?Stairs ?  ?  ?  ?  ?  ? ?Wheelchair Mobility ?  ? ?Modified Rankin (Stroke Patients Only) ?  ? ?  ? ?Balance Overall balance assessment: Modified Independent ?  ?  ?  ?  ?  ?  ?  ?  ?  ?  ?  ?  ?  ?  ?  ?  ?  ?  ?   ? ? ? ?Pertinent Vitals/Pain Pain Assessment ?Pain Assessment: 0-10 ?Pain Score: 0-No pain  ? ? ?Home Living Family/patient expects to be discharged to:: Group home ?  ?  ?  ?  ?  ?  ?  ?  ?  ?Additional Comments: Pt reports he is able to ambulate around the group home w/o issue and w/o AD  ?  ?Prior Function Prior Level of Function : Independent/Modified Independent ?  ?  ?  ?  ?  ?  ?  ?  ?  ? ? ?Hand Dominance  ? Dominant Hand: Right ? ?  ?Extremity/Trunk Assessment  ? Upper Extremity Assessment ?Upper Extremity Assessment: Generalized weakness ?  ? ?Lower Extremity Assessment ?Lower Extremity Assessment: Generalized weakness ?  ? ?   ?  Communication  ? Communication: No difficulties  ?Cognition Arousal/Alertness: Awake/alert ?Behavior During Therapy: [facility] for tasks assessed/performed ?Overall Cognitive Status: Within Functional Limits for tasks assessed ?  ?  ?  ?  ?  ?  ?  ?  ?  ?  ?  ?  ?  ?  ?  ?  ?  ?  ?  ? ?  ?General Comments   ? ?  ?Exercises    ? ?Assessment/Plan  ?  ?PT Assessment Patient needs continued PT services  ?PT Problem List Decreased strength ? ?   ?  ?PT Treatment Interventions Gait training   ? ?PT Goals (Current goals can be found in the Care Plan section)  ?Acute Rehab PT Goals ?Patient Stated Goal: To get back to the group home. ?PT Goal Formulation: With patient ?Time For Goal Achievement:  12/24/21 ?Potential to Achieve Goals: Good ? ?  ?Frequency Min 2X/week ?  ? ? ?Co-evaluation   ?  ?  ?  ?  ? ? ?  ?AM-PAC PT "6 Clicks" Mobility  ?Outcome Measure Help needed turning from your back to your side while in a flat bed without using bedrails?: None ?Help needed moving from lying on your back to sitting on the side of a flat bed without using bedrails?: None ?Help needed moving to and from a bed to a chair (including a wheelchair)?: None ?Help needed standing up from a chair using your arms (e.g., wheelchair or bedside chair)?: None ?Help needed to walk in hospital room?: A Little ?Help needed climbing 3-5 steps with a railing? : A Little ?6 Click Score: 22 ? ?  ?End of Session Equipment Utilized During Treatment: Gait belt ?Activity Tolerance: Patient tolerated treatment well ?Patient left: in chair ?Nurse Communication: Mobility status ?PT Visit Diagnosis: Unsteadiness on feet (R26.81) ?  ? ?Time: 7672-0947 ?PT Time Calculation (min) (ACUTE ONLY): 29 min ? ? ?Charges:   PT Evaluation ?$PT Eval Low Complexity: 1 Low ?PT Treatments ?$Gait Training: 8-22 mins ?  ?   ? ? ?Gwenlyn Saran, PT, DPT ?12/10/21, 5:23 PM ? ? ?[name] ?12/10/2021, 5:17 PM ? ?

## 2021-12-10 NOTE — Progress Notes (Signed)
? ? ? ?Pulmonary Medicine  ? ? ? ? ? ? ? ? ?Date: 12/10/2021,   ?MRN# [id number] MATHEO RATHBONE January 18, 1961 ? ? ?  ? ?HISTORY OF PRESENT ILLNESS  ? ?This is a 17 yr ol male phx of schizophrenia, htn, stroke, copd, still smoking here with copd exacerbation, slow turn around on appropriate regimen. As part of his w/u a chest was done.  ?impression  ?1. 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 Consultation with Pulmonology or ?Thoracic Surgery recommended. ?2. Bibasilar consolidations with a somewhat linear configuration, ?favor resolving sequela of prior infection or aspiration. ?3. Mildly enlarged right lower paratracheal lymph node, likely ?reactive. ?4. Aortic Atherosclerosis (ICD10-I70.0) and Emphysema (ICD10-J43.9). ?  ?  ?Electronically Signed ?  By: Yetta Glassman M.D. ?  On: 12/08/2021 11:39 ?  ?10/07/20 chest ct ?Lungs/Pleura: Multiple small pulmonary nodules are noted in the ?lungs bilaterally, largest of which is in the right upper lobe ?(axial image 117 of series 3), with a volume derived mean diameter ?of 6.3 mm. No other larger more suspicious appearing pulmonary ?nodules or masses are noted. No acute consolidative airspace ?disease. No pleural effusions. Diffuse bronchial wall thickening ?with mild centrilobular and paraseptal emphysema ? ?Wheezing quite a bit today ? ?PAST MEDICAL HISTORY  ? ?Past Medical History:  ?Diagnosis Date  ? Asthma   ? COPD (chronic obstructive pulmonary disease) ([place])   ? Depression   ? History of hiatal hernia   ? Hypertension   ? Pneumonia   ? Schizophrenia ([place])   ? Shortness of breath dyspnea   ? Stroke [facility])   ? Umbilical hernia   ? ? ? ?SURGICAL HISTORY  ? ?Past Surgical History:  ?Procedure Laterality Date  ? COLONOSCOPY WITH PROPOFOL N/A 09/21/2021  ? Procedure: COLONOSCOPY WITH PROPOFOL;  Surgeon: Lesly Rubenstein, MD;  Location[facility]  ENDOSCOPY;  Service: Endoscopy;  Laterality: N/A;  ? HERNIA REPAIR    ? INSERTION OF MESH N/A 12/18/2014  ? Procedure: INSERTION OF MESH;  Surgeon: Florene Glen, MD;  Location: [facility] ORS;  Service: General;  Laterality: N/A;  ? SUPRA-UMBILICAL HERNIA  9/76/7341  ? Procedure: SUPRA-UMBILICAL HERNIA;  Surgeon: Florene Glen, MD;  Location: [facility] ORS;  Service: General;;  ? UMBILICAL HERNIA REPAIR N/A 12/18/2014  ? Procedure: HERNIA REPAIR UMBILICAL ADULT;  Surgeon: Florene Glen, MD;  Location: [facility] ORS;  Service: General;  Laterality: N/A;  ? ? ? ?FAMILY HISTORY  ? ?Family History  ?Problem Relation Age of Onset  ? Asthma Mother   ? Hypertension Father   ? ? ? ?SOCIAL HISTORY  ? ?Social History  ? ?Tobacco Use  ? Smoking status: Every Day  ?  Packs/day: 0.50  ?  Years: 45.00  ?  Pack years: 22.50  ?  Types: Cigarettes  ? Smokeless tobacco: Never  ?Vaping Use  ? Vaping Use: Never used  ?Substance Use Topics  ? Alcohol use: Not Currently  ?  Alcohol/week: 75.0 standard drinks  ?  Types: 75 Cans of beer per week  ? Drug use: Not Currently  ? ? ? ?MEDICATIONS  ? ? ?Home Medication:  ?  ?Current Medication: ? ?Current Facility-Administered Medications:  ?  acetaminophen (TYLENOL) tablet 650 mg, 650 mg, Oral, BID PRN, [name], [name], MD ?  amantadine (SYMMETREL) capsule 100 mg, 100 mg, Oral, BID, [name], [name], MD,  100 mg at 12/10/21 0815 ?  ceFEPIme (MAXIPIME) 2 g in sodium chloride 0.9 % 100 mL IVPB, 2 g, Intravenous, Q8H, [name], [name], MD, Last Rate: 200 mL/hr at 12/10/21 0621, 2 g at 12/10/21 2505 ?  clopidogrel (PLAVIX) tablet 75 mg, 75 mg, Oral, Daily, [name], [name], MD, 75 mg at 12/10/21 0815 ?  diltiazem (CARDIZEM CD) 24 hr capsule 180 mg, 180 mg, Oral, Daily, [name], [name], MD, 180 mg at 12/10/21 0815 ?  enoxaparin (LOVENOX) injection 55 mg, 0.5 mg/kg, Subcutaneous, Q24H, Hallaji, Sheema M, RPH, 55 mg at 12/09/21 2131 ?  famotidine (PEPCID) tablet 20 mg, 20 mg, Oral, Daily, Nicole Kindred A,  DO, 20 mg at 12/10/21 0815 ?  gabapentin (NEURONTIN) capsule 100 mg, 100 mg, Oral, QHS, [name], [name], MD, 100 mg at 12/09/21 2131 ?  guaiFENesin (MUCINEX) 12 hr tablet 600 mg, 600 mg, Oral, BID, [name], [name], MD, 600 mg at 12/10/21 0815 ?  hydrochlorothiazide (HYDRODIURIL) tablet 12.5 mg, 12.5 mg, Oral, Daily, [name], [name], MD, 12.5 mg at 12/10/21 0815 ?  ipratropium-albuterol (DUONEB) 0.5-2.5 (3) MG/3ML nebulizer solution 3 mL, 3 mL, Nebulization, Q2H PRN, [name], [name], MD ?  ipratropium-albuterol (DUONEB) 0.5-2.5 (3) MG/3ML nebulizer solution 3 mL, 3 mL, Nebulization, TID, Richarda Osmond, MD, 3 mL at 12/10/21 0739 ?  melatonin tablet 5 mg, 5 mg, Oral, QHS PRN, [name], [name], MD, 5 mg at 12/09/21 2134 ?  methylPREDNISolone sodium succinate (SOLU-MEDROL) 40 mg/mL injection 40 mg, 40 mg, Intravenous, Q12H, Amery, Sahar, MD, 40 mg at 12/10/21 3976 ?  mirtazapine (REMERON) tablet 30 mg, 30 mg, Oral, QHS, [name], [name], MD, 30 mg at 12/09/21 2131 ?  mometasone-formoterol (DULERA) 200-5 MCG/ACT inhaler 2 puff, 2 puff, Inhalation, BID, [name], [name], MD, 2 puff at 12/10/21 0816 ?  multivitamin with minerals tablet, , Oral, Daily, [name], [name], MD, 1 tablet at 12/10/21 0815 ?  nicotine (NICODERM CQ - dosed in mg/24 hours) patch 14 mg, 14 mg, Transdermal, Daily, [name], [name], MD, 14 mg at 12/10/21 0815 ?  ondansetron (ZOFRAN) tablet 4 mg, 4 mg, Oral, Q6H PRN **OR** ondansetron (ZOFRAN) injection 4 mg, 4 mg, Intravenous, Q6H PRN, [name], [name], MD, 4 mg at 12/09/21 1012 ?  pravastatin (PRAVACHOL) tablet 20 mg, 20 mg, Oral, q1800, [name], [name], MD, 20 mg at 12/09/21 1811 ?  prochlorperazine (COMPAZINE) injection 10 mg, 10 mg, Intravenous, Q6H PRN, Nicole Kindred A, DO, 10 mg at 12/09/21 1811 ?  pyridOXINE (VITAMIN B-6) tablet 100 mg, 100 mg, Oral, Daily, [name], [name], MD, 100 mg at 12/10/21 0815 ?  QUEtiapine (SEROQUEL) tablet 200 mg, 200 mg, Oral, QHS, [name], [name], MD,  200 mg at 12/09/21 2131 ? ? ? ?ALLERGIES  ? ?Patient has no known allergies. ? ? ? ? ?REVIEW OF SYSTEMS  ? ? ?Review of Systems: ? ?Gen:  Denies  fever, sweats, chills weigh loss  ?HEENT: Denies blurred vision, double vision, ear pain, eye pain, hearing loss, nose bleeds, sore throat ?Cardiac:  No dizziness, chest pain or heaviness, chest tightness,edema ?Resp:   Denies cough or sputum porduction, +shortness of breath,+wheezing,- hemoptysis,  ?Gi: Denies swallowing difficulty, stomach pain, nausea or vomiting, diarrhea, constipation, bowel incontinence ?Gu:  Denies bladder incontinence, burning urine ?Ext:   Denies Joint pain, stiffness or swelling ?Skin: Denies  skin rash, easy bruising or bleeding or hives ?Endoc:  Denies polyuria, polydipsia , polyphagia or weight change ?Psych:   Denies depression, insomnia or hallucinations  ? ?Other:  All other systems negative ? ? ?VS:  BP (!) 130/91 (BP Location: Right Arm)   Pulse 74   Temp 97.7 ?F (36.5 ?C)   Resp 18   Ht 6\' 1"  (1.854 m)   Wt 110.2 kg   SpO2 97%   BMI 32.06 kg/m?   ? ? ? ?PHYSICAL EXAM  ? ? ?GENERAL:NAD, no fevers, chills, no weakness no fatigue ?HEAD: Normocephalic, atraumatic.  ?EYES: Pupils equal, round, reactive to light. Extraocular muscles intact. No scleral icterus.  ?MOUTH: Moist mucosal membrane. Dentition intact. No abscess noted.  ?EAR, NOSE, THROAT: Clear without exudates. No external lesions.  ?NECK: Supple. No thyromegaly. No nodules. No JVD.  ?PULMONARY: Diffuse coarse rhonchi and wheezes ?CARDIOVASCULAR: S1 and S2. Regular rate and rhythm. No murmurs, rubs, or gallops. No edema. Pedal pulses 2+ bilaterally.  ?GASTROINTESTINAL: Soft, nontender, nondistended. No masses. Positive bowel sounds. No hepatosplenomegaly.  ?MUSCULOSKELETAL: No swelling, clubbing, or edema. Range of motion full in all extremities.  ?NEUROLOGIC: Cranial nerves II through XII are intact. No gross focal neurological deficits. Sensation intact. Reflexes intact.   ?SKIN: No ulceration, lesions, rashes, or cyanosis. Skin warm and dry. Turgor intact.  ?PSYCHIATRIC: Mood, affect within normal limits. The patient is awake, alert and oriented x 3. Insight, judgment intact.  ? ?

## 2021-12-10 NOTE — Clinical Social Work Note (Signed)
?  Transition of Care (TOC) Screening Note ? ? ?Patient Details  ?Name: [name] ?Date of Birth: 23-Jul-1961 ? ? ?Transition of Care (TOC) CM/SW Contact:    ?Rivaldo Hineman A Orchid Glassberg, LCSW ?Phone [phone] ?12/10/2021, 1:20 PM ? ? ? ?Transition of Care Department [facility]) has reviewed patient and no TOC needs have been identified at this time. We will continue to monitor patient advancement through interdisciplinary progression rounds. If new patient transition needs arise, please place a TOC consult. ?  ?

## 2021-12-10 NOTE — Progress Notes (Signed)
?PROGRESS NOTE ? ?[name]    DOB: Jan 01, 1961, 61 y.o.  ?[id number]  ?  Code Status: Full Code   ?DOA: 12/06/2021   LOS: 4  ? ?Brief hospital course  ?[name] is a 61 y.o. male with a PMH significant for schizophrenia, COPD, nicotine dependence, HTN. ? ?They presented from home to the ED on 12/06/2021 with SOB x 1 days. Endorsed productive cough and DOE. Prescribed course of Abx outpatient for upper respiratory tract infection prior to presentation but did not complete. ? ?In the ED, it was found that they had hypoxia on room air at rest with O2 saturations in high 80s and improved on 2L O2. His other vital signs remained stable.  ?Significant findings included chest xray without acute process identified but evidence of chronic emphysema. ? ?They were initially treated with guaifensin, duonebs, IV methoprednisolone, CTX, magnesium.  ?Patient was admitted to medicine service for further workup and management of COPD exacerbation as outlined in detail below. ? ?12/10/21 -stable, improved ? ?Assessment & Plan  ?Principal Problem: ?  COPD exacerbation ([place]) ?Active Problems: ?  Essential hypertension ?  Schizophrenia ([place]) ?  Nicotine dependence ?  Acute on chronic respiratory failure with hypoxia (HCC) ? ?Acute hypoxic respiratory failure  COPD  tobacco use-no oxygen use at baseline.  Patient required up to 3 L O2 supplementation this admission and has improved to 2 L today.  Continues to have significant adventitious lung sounds.  Respiratory panel positive for rhino/enterovirus. ?-Wean oxygen as tolerated ?-Continue DuoNebs every 6 hours and as needed ?-Continue IV steroids ?-Continue ICS/SABA ?-Continue Abx ?-Chest CT as patient still has significant symptoms, oxygen dependence and x-ray was negative for acute disease.  Low suspicion for PE ?-Nicotine replacement PRN ?-Pulmonology consulted today ? ?Right upper lobe lung nodule -seen on CT chest 5/3.  Irregular solid pulmonary nodule of the right upper  lobe measuring 1.0 x 0.5 cm, slightly increased from previous size of 0.6 x 0.5 cm ?--Pulmonology consulted ? ?Schizophrenia-chronic, stable ?-Continue home Seroquel and mirtazapine ? ?HTN-chronic, well controlled on home therapies. ?-Discontinue home atenolol to avoid contribution to respiratory exacerbation ?-Continue home HCTZ and Cardizem ? ? ?Physical therapy evaluation pending to assess if patient functionally able to return to group home. ? ? ?Body mass index is 32.06 kg/m?. ? ?VTE ppx:  ? ? ?Diet:  ?   ?Diet  ? Diet regular Room service appropriate? Yes; Fluid consistency: Thin  ? ?Consultants: ?Pulmonology ?Subjective 12/10/21   ? ?Pt awake sitting up in bed when seen today.  He reports overall feeling a little bit better but breathing feels about the same today.  Denies fevers chills or significant cough.  No other acute complaints.  Says he has not been up walking around yet. ?  ?Objective  ? ?Vitals:  ? 12/09/21 1940 12/09/21 2045 12/10/21 0559 12/10/21 0748  ?BP:  (!) 150/103 (!) 145/89 (!) 130/91  ?Pulse:  99 80 74  ?Resp:  20 20 18   ?Temp:  98 ?F (36.7 ?C) 97.7 ?F (36.5 ?C)   ?TempSrc:      ?SpO2: 90% 92% 94% 97%  ?Weight:      ?Height:      ? ? ?Intake/Output Summary (Last 24 hours) at 12/10/2021 1250 ?Last data filed at 12/10/2021 0500 ?Gross per 24 hour  ?Intake 684 ml  ?Output 2000 ml  ?Net -1316 ml  ? ?Filed Weights  ? 12/06/21 0755  ?Weight: 110.2 kg  ?  ? ?Physical Exam:  ?  General: awake sitting up in bed, alert, no acute distress ?HEENT: Hearing grossly normal, moist mucous membranes ?Respiratory: Improved aeration on today's lung exam but remains with expiratory wheezing diffusely, normal respiratory effort at rest, 3 L/min [place] O2 ?Cardiovascular: Regular rate and rhythm, no peripheral edema ?Nervous: A&O x3. no gross focal neurologic deficits, normal speech ?Extremities: Moves all extremities, normal tone ?Skin: Dry, intact, no rashes seen on visualized skin ?Psychiatry: Normal mood, congruent  affect ? ?Labs   ?I have personally reviewed the following labs and imaging studies ?CBC ?   ?Component Value Date/Time  ? WBC 7.5 12/07/2021 0512  ? RBC 4.48 12/07/2021 0512  ? HGB 13.4 12/07/2021 0512  ? HGB 13.0 12/27/2013 2033  ? HCT 41.0 12/07/2021 0512  ? HCT 38.7 (L) 12/27/2013 2033  ? PLT 187 12/07/2021 0512  ? PLT 198 12/27/2013 2033  ? MCV 91.5 12/07/2021 0512  ? MCV 95 12/27/2013 2033  ? MCH 29.9 12/07/2021 0512  ? MCHC 32.7 12/07/2021 0512  ? RDW 13.2 12/07/2021 0512  ? RDW 14.4 12/27/2013 2033  ? LYMPHSABS 2.6 04/12/2020 1815  ? LYMPHSABS 1.2 08/21/2013 0456  ? MONOABS 0.6 04/12/2020 1815  ? MONOABS 0.5 08/21/2013 0456  ? EOSABS 0.0 04/12/2020 1815  ? EOSABS 0.0 08/21/2013 0456  ? BASOSABS 0.0 04/12/2020 1815  ? BASOSABS 0.0 08/21/2013 0456  ? ? ?  Latest Ref Rng & Units 12/07/2021  ?  5:12 AM 12/06/2021  ?  8:02 AM 04/20/2020  ?  6:21 AM  ?BMP  ?Glucose 70 - 99 mg/dL 125   108   105    ?BUN 8 - 23 mg/dL 14   11   15     ?Creatinine 0.61 - 1.24 mg/dL 0.73   0.76   0.47    ?Sodium 135 - 145 mmol/L 137   136   140    ?Potassium 3.5 - 5.1 mmol/L 4.0   3.6   3.1    ?Chloride 98 - 111 mmol/L 99   101   101    ?CO2 22 - 32 mmol/L 25   25   31     ?Calcium 8.9 - 10.3 mg/dL 9.1   9.4   8.6    ? ? ?No results found. ? ?Disposition Plan & Communication  ?Patient status: Inpatient  ?Admitted From: Home ?Planned disposition location: Home ?Anticipated discharge date: 5/6 pending improved respiratory status, and PT evaluation to assess if able to return to group home ? ?Family Communication: None ?  ?Author: ?Ezekiel Slocumb, DO ?[facility] ?12/10/2021, 12:50 PM  ? ?Available by Epic secure chat 7AM-7PM. ?If 7PM-7AM, please contact night-coverage.  ?[facility] contact information found on [email or web address]. ? ?

## 2021-12-11 DIAGNOSIS — J441 Chronic obstructive pulmonary disease with (acute) exacerbation: Secondary | ICD-10-CM | POA: Diagnosis not present

## 2021-12-11 LAB — ANGIOTENSIN CONVERTING ENZYME: Angiotensin-Converting Enzyme: 19 U/L (ref 14–82)

## 2021-12-11 NOTE — Progress Notes (Signed)
O2 sats 84% RA at rest. O2 reapplied. MD made aware. ?

## 2021-12-11 NOTE — Progress Notes (Signed)
?PROGRESS NOTE ? ?[name]    DOB: 01-20-1961, 61 y.o.  ?[id number]  ?  Code Status: Full Code   ?DOA: 12/06/2021   LOS: 5  ? ?Brief hospital course  ?[name] is a 61 y.o. male with a PMH significant for schizophrenia, COPD, nicotine dependence, HTN. ? ?They presented from home to the ED on 12/06/2021 with SOB x 1 days. Endorsed productive cough and DOE. Prescribed course of Abx outpatient for upper respiratory tract infection prior to presentation but did not complete. ? ?In the ED, it was found that they had hypoxia on room air at rest with O2 saturations in high 80s and improved on 2L O2. His other vital signs remained stable.  ?Significant findings included chest xray without acute process identified but evidence of chronic emphysema. ? ?They were initially treated with guaifensin, duonebs, IV methoprednisolone, CTX, magnesium.  ?Patient was admitted to medicine service for further workup and management of COPD exacerbation as outlined in detail below. ? ?12/11/21 -stable, improved ? ?Assessment & Plan  ?Principal Problem: ?  COPD exacerbation ([place]) ?Active Problems: ?  Essential hypertension ?  Schizophrenia ([place]) ?  Nicotine dependence ?  Acute on chronic respiratory failure with hypoxia (HCC) ? ?Acute hypoxic respiratory failure  COPD  tobacco use-no oxygen use at baseline.  Patient required up to 3 L O2 supplementation this admission and has improved to 2 L today.  Continues to have significant adventitious lung sounds.  Respiratory panel positive for rhino/enterovirus. ?-Wean oxygen as tolerated ?-Continue DuoNebs every 6 hours and as needed ?-Continue IV steroids ?-Continue ICS/SABA ?-Continue Abx ?-Nicotine replacement PRN ?-Pulmonology consulted today ? ?5/6: Attempted to wean oxygen and patient desats to 84% on room air ? ?Right upper lobe lung nodule -seen on CT chest 5/3.  Irregular solid pulmonary nodule of the right upper lobe measuring 1.0 x 0.5 cm, slightly increased from previous size  of 0.6 x 0.5 cm ?--Pulmonology consulted ? ?Schizophrenia-chronic, stable ?-Continue home Seroquel and mirtazapine ? ?HTN-chronic, well controlled on home therapies. ?-Discontinue home atenolol to avoid contribution to respiratory exacerbation ?-Continue home HCTZ and Cardizem ? ? ?Physical therapy evaluation pending to assess if patient functionally able to return to group home. ? ? ?Body mass index is 32.06 kg/m?. ? ?VTE ppx:  ? ? ?Diet:  ?   ?Diet  ? Diet regular Room service appropriate? Yes; Fluid consistency: Thin  ? ?Consultants: ?Pulmonology ?Subjective 12/11/21   ? ?Pt awake resting in bed when seen today.  He reports breathing is okay but about the same.  Continues to have a lot of wheezing and shortness of breath with exertion.  No other acute complaints or acute events reported. ?  ?Objective  ? ?Vitals:  ? 12/11/21 0820 12/11/21 1109 12/11/21 1111 12/11/21 1217  ?BP: 118/75   (!) 132/94  ?Pulse: 71   77  ?Resp: 18   16  ?Temp: 98 ?F (36.7 ?C)   97.8 ?F (36.6 ?C)  ?TempSrc:      ?SpO2: 96% (!) 84% 94% 91%  ?Weight:      ?Height:      ? ? ?Intake/Output Summary (Last 24 hours) at 12/11/2021 1455 ?Last data filed at 12/11/2021 1357 ?Gross per 24 hour  ?Intake 560 ml  ?Output 2000 ml  ?Net -1440 ml  ? ?Filed Weights  ? 12/06/21 0755  ?Weight: 110.2 kg  ?  ? ?Physical Exam:  ?General: awake resting in bed, alert, no acute distress ?HEENT: Hearing grossly normal, moist  mucous membranes ?Respiratory: Persistent expiratory wheezes diffusely, normal respiratory effort at rest, 3 L/min [place] O2 ?Cardiovascular: Regular rate and rhythm, no peripheral edema ?Nervous: A&O x3. no gross focal neurologic deficits, normal speech ?Extremities: Moves all extremities, normal tone ?Psychiatry: Normal mood, congruent affect ? ?Labs   ?I have personally reviewed the following labs and imaging studies ?CBC ?   ?Component Value Date/Time  ? WBC 7.5 12/07/2021 0512  ? RBC 4.48 12/07/2021 0512  ? HGB 13.4 12/07/2021 0512  ? HGB 13.0  12/27/2013 2033  ? HCT 41.0 12/07/2021 0512  ? HCT 38.7 (L) 12/27/2013 2033  ? PLT 187 12/07/2021 0512  ? PLT 198 12/27/2013 2033  ? MCV 91.5 12/07/2021 0512  ? MCV 95 12/27/2013 2033  ? MCH 29.9 12/07/2021 0512  ? MCHC 32.7 12/07/2021 0512  ? RDW 13.2 12/07/2021 0512  ? RDW 14.4 12/27/2013 2033  ? LYMPHSABS 2.6 04/12/2020 1815  ? LYMPHSABS 1.2 08/21/2013 0456  ? MONOABS 0.6 04/12/2020 1815  ? MONOABS 0.5 08/21/2013 0456  ? EOSABS 0.0 04/12/2020 1815  ? EOSABS 0.0 08/21/2013 0456  ? BASOSABS 0.0 04/12/2020 1815  ? BASOSABS 0.0 08/21/2013 0456  ? ? ?  Latest Ref Rng & Units 12/07/2021  ?  5:12 AM 12/06/2021  ?  8:02 AM 04/20/2020  ?  6:21 AM  ?BMP  ?Glucose 70 - 99 mg/dL 125   108   105    ?BUN 8 - 23 mg/dL 14   11   15     ?Creatinine 0.61 - 1.24 mg/dL 0.73   0.76   0.47    ?Sodium 135 - 145 mmol/L 137   136   140    ?Potassium 3.5 - 5.1 mmol/L 4.0   3.6   3.1    ?Chloride 98 - 111 mmol/L 99   101   101    ?CO2 22 - 32 mmol/L 25   25   31     ?Calcium 8.9 - 10.3 mg/dL 9.1   9.4   8.6    ? ? ?No results found. ? ?Disposition Plan & Communication  ?Patient status: Inpatient  ?Admitted From: Home ?Planned disposition location: Home ?Anticipated discharge date: 5/7-8 pending improved respiratory status. Continues to have significant O2 desaturations, weaning oxygen ? ?Family Communication: None ?  ?Author: ?Ezekiel Slocumb, DO ?[facility] ?12/11/2021, 2:55 PM  ? ?Available by Epic secure chat 7AM-7PM. ?If 7PM-7AM, please contact night-coverage.  ?[facility] contact information found on [email or web address] ? ?

## 2021-12-11 NOTE — Progress Notes (Signed)
Spoke with MD on rounds. Will attempt to wean O2. Room air trial begun.  ?

## 2021-12-12 DIAGNOSIS — R911 Solitary pulmonary nodule: Secondary | ICD-10-CM | POA: Insufficient documentation

## 2021-12-12 DIAGNOSIS — C3490 Malignant neoplasm of unspecified part of unspecified bronchus or lung: Secondary | ICD-10-CM | POA: Diagnosis present

## 2021-12-12 DIAGNOSIS — D72829 Elevated white blood cell count, unspecified: Secondary | ICD-10-CM

## 2021-12-12 DIAGNOSIS — R918 Other nonspecific abnormal finding of lung field: Secondary | ICD-10-CM | POA: Diagnosis present

## 2021-12-12 DIAGNOSIS — J441 Chronic obstructive pulmonary disease with (acute) exacerbation: Secondary | ICD-10-CM | POA: Diagnosis not present

## 2021-12-12 DIAGNOSIS — C3492 Malignant neoplasm of unspecified part of left bronchus or lung: Secondary | ICD-10-CM | POA: Diagnosis present

## 2021-12-12 LAB — CBC
HCT: 41.6 % (ref 39.0–52.0)
Hemoglobin: 14.3 g/dL (ref 13.0–17.0)
MCH: 30.4 pg (ref 26.0–34.0)
MCHC: 34.4 g/dL (ref 30.0–36.0)
MCV: 88.5 fL (ref 80.0–100.0)
Platelets: 213 10*3/uL (ref 150–400)
RBC: 4.7 MIL/uL (ref 4.22–5.81)
RDW: 12.9 % (ref 11.5–15.5)
WBC: 16.3 10*3/uL — ABNORMAL HIGH (ref 4.0–10.5)
nRBC: 0 % (ref 0.0–0.2)

## 2021-12-12 LAB — BASIC METABOLIC PANEL
Anion gap: 8 (ref 5–15)
BUN: 22 mg/dL (ref 8–23)
CO2: 26 mmol/L (ref 22–32)
Calcium: 9.2 mg/dL (ref 8.9–10.3)
Chloride: 99 mmol/L (ref 98–111)
Creatinine, Ser: 0.57 mg/dL — ABNORMAL LOW (ref 0.61–1.24)
GFR, Estimated: >60 mL/min (ref >60)
Glucose, Bld: 121 mg/dL — ABNORMAL HIGH (ref 70–99)
Potassium: 4.1 mmol/L (ref 3.5–5.1)
Sodium: 133 mmol/L — ABNORMAL LOW (ref 135–145)

## 2021-12-12 MED ORDER — BUDESONIDE 0.25 MG/2ML IN SUSP
0.2500 mg | Freq: Two times a day (BID) | RESPIRATORY_TRACT | Status: DC
  Administered 2021-12-12 – 2021-12-14 (×5): 0.25 mg via RESPIRATORY_TRACT
  Filled 2021-12-12 (×5): qty 2

## 2021-12-12 NOTE — Progress Notes (Signed)
?PROGRESS NOTE ? ?[name]    DOB: [birth year], 61 y.o.  ?[id number]  ?  Code Status: Full Code   ?DOA: 12/06/2021   LOS: 6  ? ?Brief hospital course  ?[name] [place] is a 61 y.o. male with a PMH significant for schizophrenia, COPD, nicotine dependence, HTN. ? ?They presented from home to the ED on 12/06/2021 with SOB x 1 days. Endorsed productive cough and DOE. Prescribed course of Abx outpatient for upper respiratory tract infection prior to presentation but did not complete. ? ?In the ED, it was found that they had hypoxia on room air at rest with O2 saturations in high 80s and improved on 2L O2. His other vital signs remained stable.  ?Significant findings included chest xray without acute process identified but evidence of chronic emphysema. ? ?They were initially treated with guaifensin, duonebs, IV methoprednisolone, CTX, magnesium.  ?Patient was admitted to medicine service for further workup and management of COPD exacerbation as outlined in detail below. ? ?12/12/21 - continues to have diffuse wheezing and oxygen requirement ? ?Assessment & Plan  ?Principal Problem: ?  COPD exacerbation ([place]) ?Active Problems: ?  Essential hypertension ?  Schizophrenia ([place]) ?  Nicotine dependence ?  Acute on chronic respiratory failure with hypoxia (HCC) ?  Pulmonary nodule, right ? ?Acute hypoxic respiratory failure  COPD  tobacco use-no oxygen use at baseline.  Patient required up to 3 L O2 supplementation this admission and has improved to 2 L today.  Continues to have significant adventitious lung sounds.  Respiratory panel positive for rhino/enterovirus. ?-Wean oxygen as tolerated ?-Continue DuoNebs every 6 hours and as needed ?-Continue IV steroids ?-Add pulmicort nebs BID ?-Continue ICS/SABA ?-Continue Abx ?-Nicotine replacement PRN ?-Pulmonology consulted  ?-May need home oxygen at d/c if unable to wean in another day or two ? ?Overall has been slow to improve, likely due to +rhinovirus infection. ?5/6:  Attempted to wean oxygen and patient desats to 84% on room air ?5/7: Continue attempting to wean oxygen, still significant wheezing ? ?Right upper lobe lung nodule -seen on CT chest 5/3.  Irregular solid pulmonary nodule of the right upper lobe measuring 1.0 x 0.5 cm, slightly increased from previous size of 0.6 x 0.5 cm ?--Pulmonology consulted ? ?Schizophrenia-chronic, stable ?-Continue home Seroquel and mirtazapine ? ?HTN-chronic, well controlled on home therapies. ?-Discontinue home atenolol to avoid contribution to respiratory exacerbation ?-Continue home HCTZ and Cardizem ? ? ?Physical therapy evaluation pending to assess if patient functionally able to return to group home. ? ? ?Body mass index is 32.06 kg/m?. ? ?VTE ppx:  ? ? ?Diet:  ?   ?Diet  ? Diet regular Room service appropriate? Yes; Fluid consistency: Thin  ? ?Consultants: ?Pulmonology ?Subjective 12/12/21   ? ?Pt awake resting in bed when seen today.  Reports ongoing nonproductive cough but overall breathing feels better.  Still has significant wheezing.  Says he does not want to have to go home with oxygen.  No other acute complaints or acute events reported ?  ?Objective  ? ?Vitals:  ? 12/11/21 1947 12/11/21 2051 12/12/21 0622 12/12/21 0857  ?BP:  (!) 130/95 (!) 122/91 (!) 136/95  ?Pulse:  81 78 80  ?Resp:  20 18 18   ?Temp:  97.8 ?F (36.6 ?C) 98.4 ?F (36.9 ?C) 98.5 ?F (36.9 ?C)  ?TempSrc:  Oral    ?SpO2: 94% 95% 96% 96%  ?Weight:      ?Height:      ? ? ?Intake/Output Summary (Last 24  hours) at 12/12/2021 1147 ?Last data filed at 12/12/2021 6384 ?Gross per 24 hour  ?Intake 120 ml  ?Output 1750 ml  ?Net -1630 ml  ? ?Filed Weights  ? 12/06/21 0755  ?Weight: 110.2 kg  ?  ? ?Physical Exam:  ?General: awake resting in bed, alert, no acute distress ?HEENT: Hearing grossly normal, moist mucous membranes ?Respiratory: Ongoing diffuse expiratory wheezes and poor air movement, normal respiratory effort at rest on 3 L/min [place] O2 ?Cardiovascular: Regular rate and  rhythm, no peripheral edema ?Nervous: A&O x3. no gross focal neurologic deficits, normal speech ?Extremities: Moves all extremities, normal tone ?Psychiatry: Normal mood, congruent affect ? ?Labs   ?I have personally reviewed the following labs and imaging studies ?CBC ?   ?Component Value Date/Time  ? WBC 16.3 (H) 12/12/2021 0606  ? RBC 4.70 12/12/2021 0606  ? HGB 14.3 12/12/2021 0606  ? HGB 13.0 12/27/2013 2033  ? HCT 41.6 12/12/2021 0606  ? HCT 38.7 (L) 12/27/2013 2033  ? PLT 213 12/12/2021 0606  ? PLT 198 12/27/2013 2033  ? MCV 88.5 12/12/2021 0606  ? MCV 95 12/27/2013 2033  ? MCH 30.4 12/12/2021 0606  ? MCHC 34.4 12/12/2021 0606  ? RDW 12.9 12/12/2021 0606  ? RDW 14.4 12/27/2013 2033  ? LYMPHSABS 2.6 04/12/2020 1815  ? LYMPHSABS 1.2 08/21/2013 0456  ? MONOABS 0.6 04/12/2020 1815  ? MONOABS 0.5 08/21/2013 0456  ? EOSABS 0.0 04/12/2020 1815  ? EOSABS 0.0 08/21/2013 0456  ? BASOSABS 0.0 04/12/2020 1815  ? BASOSABS 0.0 08/21/2013 0456  ? ? ?  Latest Ref Rng & Units 12/12/2021  ?  6:06 AM 12/07/2021  ?  5:12 AM 12/06/2021  ?  8:02 AM  ?BMP  ?Glucose 70 - 99 mg/dL 121   125   108    ?BUN 8 - 23 mg/dL 22   14   11     ?Creatinine 0.61 - 1.24 mg/dL 0.57   0.73   0.76    ?Sodium 135 - 145 mmol/L 133   137   136    ?Potassium 3.5 - 5.1 mmol/L 4.1   4.0   3.6    ?Chloride 98 - 111 mmol/L 99   99   101    ?CO2 22 - 32 mmol/L 26   25   25     ?Calcium 8.9 - 10.3 mg/dL 9.2   9.1   9.4    ? ? ?No results found. ? ?Disposition Plan & Communication  ?Patient status: Inpatient  ?Admitted From: Home ?Planned disposition location: Home ?Anticipated discharge date: 5/7-8 pending improved respiratory status. Continues to have significant O2 desaturations, weaning oxygen ? ?Family Communication: None ?  ?Author: ?Ezekiel Slocumb, DO ?[facility] ?12/12/2021, 11:47 AM  ? ?Available by Epic secure chat 7AM-7PM. ?If 7PM-7AM, please contact night-coverage.  ?[facility] contact information found on [email or web address] ? ?

## 2021-12-12 NOTE — Plan of Care (Signed)

## 2021-12-12 NOTE — Assessment & Plan Note (Signed)
Seen on CT chest obtained this admission due to slow improvement of COPD exacerbation.  RUL nodule increased in size from prior imaging. ? ?Pulmonology consulted, Dr. Raul Del: ?"Concern about slow growing malgnany, infectious or granulomatous ?-quantifern tb gold ?-when stable tissue bx vs outpatient PET scan; due to his mental and overall pulm status will discuss with radiation oncolgy to consider emperic xrt.  ?-ace level, anca drawn" ?

## 2021-12-12 NOTE — Assessment & Plan Note (Signed)
Due to steroids.  Monitor CBC. ?

## 2021-12-13 ENCOUNTER — Ambulatory Visit: Payer: Medicare Other | Admitting: Podiatry

## 2021-12-13 DIAGNOSIS — J441 Chronic obstructive pulmonary disease with (acute) exacerbation: Secondary | ICD-10-CM | POA: Diagnosis not present

## 2021-12-13 LAB — ANCA PROFILE
Anti-MPO Antibodies: <0.2 units (ref 0.0–0.9)
Anti-PR3 Antibodies: <0.2 units (ref 0.0–0.9)
Atypical P-ANCA titer: <1:20 {titer}
C-ANCA: <1:20 {titer}
P-ANCA: <1:20 {titer}

## 2021-12-13 LAB — CREATININE, SERUM
Creatinine, Ser: 0.51 mg/dL — ABNORMAL LOW (ref 0.61–1.24)
GFR, Estimated: >60 mL/min (ref >60)

## 2021-12-13 MED ORDER — PREDNISONE 20 MG PO TABS
40.0000 mg | ORAL_TABLET | Freq: Every day | ORAL | Status: DC
  Administered 2021-12-13 – 2021-12-14 (×2): 40 mg via ORAL
  Filled 2021-12-13 (×2): qty 2

## 2021-12-13 MED ORDER — PREDNISONE 20 MG PO TABS: 40.0000 mg | ORAL_TABLET | Freq: Every day | ORAL | Status: DC

## 2021-12-13 NOTE — Progress Notes (Signed)
?PROGRESS NOTE ? ?[name]    DOB: 30-Mar-1961, 61 y.o.  ?[id number]  ?  Code Status: Full Code   ?DOA: 12/06/2021   LOS: 7  ? ?Brief hospital course  ?[name] is a 61 y.o. male with a PMH significant for schizophrenia, COPD, nicotine dependence, HTN. ? ?They presented from home to the ED on 12/06/2021 with SOB x 1 days. Endorsed productive cough and DOE. Prescribed course of Abx outpatient for upper respiratory tract infection prior to presentation but did not complete. ? ?In the ED, it was found that they had hypoxia on room air at rest with O2 saturations in high 80s and improved on 2L O2. His other vital signs remained stable.  ?Significant findings included chest xray without acute process identified but evidence of chronic emphysema. ? ?They were initially treated with guaifensin, duonebs, IV methoprednisolone, CTX, magnesium.  ?Patient was admitted to medicine service for further workup and management of COPD exacerbation as outlined in detail below. ? ?12/13/21 - continues to have diffuse wheezing and oxygen requirement ? ?Assessment & Plan  ?Principal Problem: ?  COPD exacerbation ([place]) ?Active Problems: ?  Essential hypertension ?  Schizophrenia ([place]) ?  Nicotine dependence ?  Acute on chronic respiratory failure with hypoxia (HCC) ?  Pulmonary nodule, right ?  Leukocytosis ? ?Acute hypoxic respiratory failure  COPD  tobacco use-no oxygen use at baseline.  Patient required up to 3 L O2 supplementation this admission and has improved to 2 L today.  Continues to have significant adventitious lung sounds.  Respiratory panel positive for rhino/enterovirus. ?-Wean oxygen as tolerated ?-Continue DuoNebs every 6 hours and as needed ?-Continue IV steroids ?-Add pulmicort nebs BID ?-Continue ICS/SABA ?-Continue Abx ?-Nicotine replacement PRN ?-Pulmonology consulted  ?-Home oxygen qualification today ? ?Overall has been slow to improve, likely due to +rhinovirus infection. ?5/6: Attempted to wean oxygen and  patient desats to 84% on room air ?5/7: Continue attempting to wean oxygen, still significant wheezing ?5/8: Medically stable for discharge on 2 L/min supplemental O2.  Awaiting group home to confirm if patient can return with oxygen.  TOC on board. ? ?Right upper lobe lung nodule -seen on CT chest 5/3.  Irregular solid pulmonary nodule of the right upper lobe measuring 1.0 x 0.5 cm, slightly increased from previous size of 0.6 x 0.5 cm ?--Pulmonology consulted ? ?Schizophrenia-chronic, stable ?-Continue home Seroquel and mirtazapine ? ?HTN-chronic, well controlled on home therapies. ?-Discontinue home atenolol to avoid contribution to respiratory exacerbation ?-Continue home HCTZ and Cardizem ? ? ?Physical therapy evaluation pending to assess if patient functionally able to return to group home. ? ? ?Body mass index is 32.06 kg/m?. ? ?VTE ppx:  ? ? ?Diet:  ?   ?Diet  ? Diet regular Room service appropriate? Yes; Fluid consistency: Thin  ? ?Consultants: ?Pulmonology ?Subjective 12/13/21   ? ?Pt seen up in recliner today.  Reports no trouble breathing.  Says he always has some wheezing.  No other acute complaints.  ?  ?Objective  ? ?Vitals:  ? 12/12/21 2046 12/13/21 0534 12/13/21 0823 12/13/21 1001  ?BP: (!) 146/89 (!) 130/99 (!) 135/104 (!) 143/97  ?Pulse: 86 75 92 84  ?Resp: 20 18 17    ?Temp: (!) 97.5 ?F (36.4 ?C) (!) 97.5 ?F (36.4 ?C) 98.1 ?F (36.7 ?C)   ?TempSrc: Oral Oral    ?SpO2: 95% 96% 94%   ?Weight:      ?Height:      ? ? ?Intake/Output Summary (Last 24  hours) at 12/13/2021 1434 ?Last data filed at 12/13/2021 1000 ?Gross per 24 hour  ?Intake 240 ml  ?Output 1700 ml  ?Net -1460 ml  ? ?Filed Weights  ? 12/06/21 0755  ?Weight: 110.2 kg  ?  ? ?Physical Exam:  ?General: awake resting in bed, alert, no acute distress ?HEENT: Hearing grossly normal, moist mucous membranes ?Respiratory: improved air movement, still wheezing but improved, on 2 L/min [place] O2 ?Cardiovascular: Regular rate and rhythm, no peripheral  edema ?Nervous: A&O x3. no gross focal neurologic deficits, normal speech ?Extremities: Moves all extremities, normal tone ?Psychiatry: Normal mood, congruent affect ? ?Labs   ?I have personally reviewed the following labs and imaging studies ?CBC ?   ?Component Value Date/Time  ? WBC 16.3 (H) 12/12/2021 0606  ? RBC 4.70 12/12/2021 0606  ? HGB 14.3 12/12/2021 0606  ? HGB 13.0 12/27/2013 2033  ? HCT 41.6 12/12/2021 0606  ? HCT 38.7 (L) 12/27/2013 2033  ? PLT 213 12/12/2021 0606  ? PLT 198 12/27/2013 2033  ? MCV 88.5 12/12/2021 0606  ? MCV 95 12/27/2013 2033  ? MCH 30.4 12/12/2021 0606  ? MCHC 34.4 12/12/2021 0606  ? RDW 12.9 12/12/2021 0606  ? RDW 14.4 12/27/2013 2033  ? LYMPHSABS 2.6 04/12/2020 1815  ? LYMPHSABS 1.2 08/21/2013 0456  ? MONOABS 0.6 04/12/2020 1815  ? MONOABS 0.5 08/21/2013 0456  ? EOSABS 0.0 04/12/2020 1815  ? EOSABS 0.0 08/21/2013 0456  ? BASOSABS 0.0 04/12/2020 1815  ? BASOSABS 0.0 08/21/2013 0456  ? ? ?  Latest Ref Rng & Units 12/13/2021  ?  5:26 AM 12/12/2021  ?  6:06 AM 12/07/2021  ?  5:12 AM  ?BMP  ?Glucose 70 - 99 mg/dL  121   125    ?BUN 8 - 23 mg/dL  22   14    ?Creatinine 0.61 - 1.24 mg/dL 0.51   0.57   0.73    ?Sodium 135 - 145 mmol/L  133   137    ?Potassium 3.5 - 5.1 mmol/L  4.1   4.0    ?Chloride 98 - 111 mmol/L  99   99    ?CO2 22 - 32 mmol/L  26   25    ?Calcium 8.9 - 10.3 mg/dL  9.2   9.1    ? ? ?No results found. ? ?Disposition Plan & Communication  ?Patient status: Inpatient  ?Admitted From: group home ? ?Planned disposition location: Return to group home if able to take him back with oxygen ? ?Anticipated discharge date: 5/8 medically stable for discharge, awaiting confirmation group home regarding oxygen. Otherwise will need placement. ? ?Family Communication: None ?  ?Author: ?Ezekiel Slocumb, DO ?[facility] ?12/13/2021, 2:34 PM  ? ?Available by Epic secure chat 7AM-7PM. ?If 7PM-7AM, please contact night-coverage.  ?[facility] contact information found on [email or web address] ? ?

## 2021-12-13 NOTE — Progress Notes (Signed)
Physical Therapy Treatment ?Patient Details ?Name: [name] ?MRN: [id number] ?DOB: 22-Aug-1960 ?Today's Date: 12/13/2021 ? ? ?History of Present Illness LANNY LIPKIN is a 61 y.o. male with a PMH significant for schizophrenia, COPD, nicotine dependence, HTN. ? ?  ?PT Comments  ? ? Pt in recliner on entry, AMB with mobility team earlier in day, required supplemental O2 and had tachycardia while up. This session, pt able to AMB twice on room air, sats 92% or greater throughout, however pt still very limited by DOE, requiring ~2 minutes seated recovery. DOE only mildly correlates to hypoxia. Pt able to walk 7 laps in room without device, no LOB.   ?Recommendations for follow up therapy are one component of a multi-disciplinary discharge planning process, led by the attending physician.  Recommendations may be updated based on patient status, additional functional criteria and insurance authorization. ? ?Follow Up Recommendations ? Home health PT ?  ?  ?Assistance Recommended at Discharge Intermittent Supervision/Assistance  ?Patient can return home with the following   ?  ?Equipment Recommendations ? None recommended by PT  ?  ?Recommendations for Other Services   ? ? ?  ?Precautions / Restrictions Precautions ?Precautions: Fall ?Restrictions ?Weight Bearing Restrictions: No  ?  ? ?Mobility ? Bed Mobility ?  ?  ?  ?  ?  ?  ?  ?  ?  ? ?Transfers ?  ?  ?  ?  ?  ?  ?  ?  ?  ?  ?  ? ?Ambulation/Gait ?Ambulation/Gait assistance: Supervision ?Gait Distance (Feet): 160 Feet (126ft, then 123ft, requires 2 minutes to recovery DOE; lowest SpO2 92%on room air.) ?Assistive device: None ?Gait Pattern/deviations: WFL(Within Functional Limits) ?Gait velocity: increased ?  ?  ?  ? ? ?Stairs ?  ?  ?  ?  ?  ? ? ?Wheelchair Mobility ?  ? ?Modified Rankin (Stroke Patients Only) ?  ? ? ?  ?Balance Overall balance assessment: Modified Independent ?  ?  ?  ?  ?  ?  ?  ?  ?  ?  ?  ?  ?  ?  ?  ?  ?  ?  ?  ? ?  ?Cognition Arousal/Alertness:  Awake/alert ?Behavior During Therapy: [facility] for tasks assessed/performed, Flat affect ?Overall Cognitive Status: Within Functional Limits for tasks assessed ?  ?  ?  ?  ?  ?  ?  ?  ?  ?  ?  ?  ?  ?  ?  ?  ?  ?  ?  ? ?  ?Exercises   ? ?  ?General Comments   ?  ?  ? ?Pertinent Vitals/Pain Pain Assessment ?Pain Assessment: No/denies pain  ? ? ?Home Living   ?  ?  ?  ?  ?  ?  ?  ?  ?  ?   ?  ?Prior Function    ?  ?  ?   ? ?PT Goals (current goals can now be found in the care plan section) Acute Rehab PT Goals ?Patient Stated Goal: To get back to the group home. ?PT Goal Formulation: With patient ?Time For Goal Achievement: 12/24/21 ?Potential to Achieve Goals: Good ?Progress towards PT goals: Progressing toward goals ? ?  ?Frequency ? ? ? Min 2X/week ? ? ? ?  ?PT Plan Current plan remains appropriate  ? ? ?Co-evaluation   ?  ?  ?  ?  ? ?  ?AM-PAC PT "6 Clicks" Mobility   ?  Outcome Measure ? Help needed turning from your back to your side while in a flat bed without using bedrails?: None ?Help needed moving from lying on your back to sitting on the side of a flat bed without using bedrails?: None ?Help needed moving to and from a bed to a chair (including a wheelchair)?: None ?Help needed standing up from a chair using your arms (e.g., wheelchair or bedside chair)?: None ?Help needed to walk in hospital room?: A Little ?Help needed climbing 3-5 steps with a railing? : A Little ?6 Click Score: 22 ? ?  ?End of Session Equipment Utilized During Treatment: Oxygen ?Activity Tolerance: Patient tolerated treatment well ?Patient left: in chair;with call bell/phone within reach;with chair alarm set ?Nurse Communication: Mobility status ?PT Visit Diagnosis: Unsteadiness on feet (R26.81) ?  ? ? ?Time: [id number] ?PT Time Calculation (min) (ACUTE ONLY): 13 min ? ?Charges:  $Therapeutic Exercise: 8-22 mins          ?          ?3:07 PM, 12/13/21 ?Etta Grandchild, PT, DPT ?Physical Therapist - [place] ?[facility]   ?[phone] (ASCOM)  ? ? ?Venise Ellingwood C ?12/13/2021, 3:05 PM ? ?

## 2021-12-13 NOTE — Progress Notes (Signed)
Mobility Specialist - Progress Note ? ? 12/13/21 1046  ?Mobility  ?Activity Ambulated with assistance in hallway;Stood at bedside;Dangled on edge of bed;Transferred from bed to chair  ?Level of Assistance Modified independent, requires aide device or extra time  ?Assistive Device None  ?Distance Ambulated (ft) 100 ft  ?Activity Response Tolerated well  ?$Mobility charge 1 Mobility  ? ? ? ?Pre-mobility lying: 90 HR, 91% SpO2 2L ?Pre-mobility standing: 105 HR, 90% SpO2 2L ?During mobility ambulating : 112-119 HR, 89-91% SpO2 2L ?Post-mobility sitting : 92 HR, 93% SPO2 ? ?Pt semi supine upon arrival using RA. Completes bed mobility and STS ModI. Pt ambulates 133ft with Supervision/ModI denying chest pain or dizziness --- SOB noted. Pt returns to recliner for ~ 3 minute recovery break. Pt is left in recliner with alarm set and needs in reach. ? ?Merrily Brittle ?Mobility Specialist ?12/13/21, 10:54 AM ? ? ?

## 2021-12-13 NOTE — Progress Notes (Signed)
? ? ? ?Pulmonary Medicine  ? ? ? ? ? ? ? ? ?Date: 12/13/2021,   ?MRN# [id number] [name] Aug 18, 1960 ? ? ?  ?AdmissionWeight: 110.2 kg                 ?CurrentWeight: 110.2 kg ? ? ? ? ? ?HISTORY OF PRESENT ILLNESS  ? ?Pulmonary wise he is still wheezing, maybe some better. Not sure what is his baseline. On broad coverage. He is oxygenat trwo liters, in no respiratory distress. He voices no acute pulmonary issues.  ? ? ? ?PAST MEDICAL HISTORY  ? ?Past Medical History:  ?Diagnosis Date  ? Asthma   ? COPD (chronic obstructive pulmonary disease) ([place])   ? Depression   ? History of hiatal hernia   ? Hypertension   ? Pneumonia   ? Schizophrenia ([place])   ? Shortness of breath dyspnea   ? Stroke [facility])   ? Umbilical hernia   ? ? ? ?SURGICAL HISTORY  ? ?Past Surgical History:  ?Procedure Laterality Date  ? COLONOSCOPY WITH PROPOFOL N/A 09/21/2021  ? Procedure: COLONOSCOPY WITH PROPOFOL;  Surgeon: Lesly Rubenstein, MD;  Location: [facility] ENDOSCOPY;  Service: Endoscopy;  Laterality: N/A;  ? HERNIA REPAIR    ? INSERTION OF MESH N/A 12/18/2014  ? Procedure: INSERTION OF MESH;  Surgeon: Florene Glen, MD;  Location: [facility] ORS;  Service: General;  Laterality: N/A;  ? SUPRA-UMBILICAL HERNIA  9/52/8413  ? Procedure: SUPRA-UMBILICAL HERNIA;  Surgeon: Florene Glen, MD;  Location: [facility] ORS;  Service: General;;  ? UMBILICAL HERNIA REPAIR N/A 12/18/2014  ? Procedure: HERNIA REPAIR UMBILICAL ADULT;  Surgeon: Florene Glen, MD;  Location: [facility] ORS;  Service: General;  Laterality: N/A;  ? ? ? ?FAMILY HISTORY  ? ?Family History  ?Problem Relation Age of Onset  ? Asthma Mother   ? Hypertension Father   ? ? ? ?SOCIAL HISTORY  ? ?Social History  ? ?Tobacco Use  ? Smoking status: Every Day  ?  Packs/day: 0.50  ?  Years: 45.00  ?  Pack years: 22.50  ?  Types: Cigarettes  ? Smokeless tobacco: Never  ?Vaping Use  ? Vaping Use: Never used  ?Substance Use Topics  ? Alcohol use: Not Currently  ?  Alcohol/week: 75.0 standard drinks  ?  Types: 75  Cans of beer per week  ? Drug use: Not Currently  ? ? ? ?MEDICATIONS  ? ? ?Home Medication:  ?  ?Current Medication: ? ?Current Facility-Administered Medications:  ?  acetaminophen (TYLENOL) tablet 650 mg, 650 mg, Oral, BID PRN, [name], [name], MD ?  amantadine (SYMMETREL) capsule 100 mg, 100 mg, Oral, BID, [name], [name], MD, 100 mg at 12/13/21 1002 ?  budesonide (PULMICORT) nebulizer solution 0.25 mg, 0.25 mg, Nebulization, BID, Nicole Kindred A, DO, 0.25 mg at 12/13/21 2440 ?  clopidogrel (PLAVIX) tablet 75 mg, 75 mg, Oral, Daily, [name], [name], MD, 75 mg at 12/13/21 1003 ?  diltiazem (CARDIZEM CD) 24 hr capsule 180 mg, 180 mg, Oral, Daily, [name], [name], MD, 180 mg at 12/13/21 1003 ?  enoxaparin (LOVENOX) injection 55 mg, 0.5 mg/kg, Subcutaneous, Q24H, Hallaji, Sheema M, RPH, 55 mg at 12/12/21 2040 ?  famotidine (PEPCID) tablet 20 mg, 20 mg, Oral, Daily, Nicole Kindred A, DO, 20 mg at 12/13/21 1003 ?  gabapentin (NEURONTIN) capsule 100 mg, 100 mg, Oral, QHS, [name], [name], MD, 100 mg at 12/12/21 2041 ?  guaiFENesin (MUCINEX) 12 hr tablet 600 mg, 600 mg, Oral, BID, [name],  [name], MD, 600 mg at 12/13/21 1003 ?  hydrochlorothiazide (HYDRODIURIL) tablet 12.5 mg, 12.5 mg, Oral, Daily, [name], [name], MD, 12.5 mg at 12/13/21 1003 ?  ipratropium-albuterol (DUONEB) 0.5-2.5 (3) MG/3ML nebulizer solution 3 mL, 3 mL, Nebulization, Q2H PRN, [name], [name], MD ?  ipratropium-albuterol (DUONEB) 0.5-2.5 (3) MG/3ML nebulizer solution 3 mL, 3 mL, Nebulization, TID, Richarda Osmond, MD, 3 mL at 12/13/21 0720 ?  melatonin tablet 5 mg, 5 mg, Oral, QHS PRN, [name], [name], MD, 5 mg at 12/12/21 2041 ?  mirtazapine (REMERON) tablet 30 mg, 30 mg, Oral, QHS, [name], [name], MD, 30 mg at 12/12/21 2040 ?  montelukast (SINGULAIR) tablet 10 mg, 10 mg, Oral, QHS, Jamesia Linnen E, MD, 10 mg at 12/12/21 2041 ?  multivitamin with minerals tablet, , Oral, Daily, [name], [name], MD, 1 tablet at 12/13/21 1003 ?   nicotine (NICODERM CQ - dosed in mg/24 hours) patch 14 mg, 14 mg, Transdermal, Daily, [name], [name], MD, 14 mg at 12/13/21 1003 ?  ondansetron (ZOFRAN) tablet 4 mg, 4 mg, Oral, Q6H PRN **OR** ondansetron (ZOFRAN) injection 4 mg, 4 mg, Intravenous, Q6H PRN, [name], [name], MD, 4 mg at 12/09/21 1012 ?  pravastatin (PRAVACHOL) tablet 20 mg, 20 mg, Oral, q1800, [name], [name], MD, 20 mg at 12/12/21 1753 ?  predniSONE (DELTASONE) tablet 40 mg, 40 mg, Oral, Q breakfast, Rito Ehrlich A, RPH, 40 mg at 12/13/21 1007 ?  prochlorperazine (COMPAZINE) injection 10 mg, 10 mg, Intravenous, Q6H PRN, Nicole Kindred A, DO, 10 mg at 12/09/21 1811 ?  pyridOXINE (VITAMIN B-6) tablet 100 mg, 100 mg, Oral, Daily, [name], [name], MD, 100 mg at 12/13/21 1002 ?  QUEtiapine (SEROQUEL) tablet 200 mg, 200 mg, Oral, QHS, [name], [name], MD, 200 mg at 12/12/21 2112 ? ? ? ?ALLERGIES  ? ?Patient has no known allergies. ? ? ? ? ?REVIEW OF SYSTEMS  ? ? ?Review of Systems: ? ?Gen:  Denies  fever, sweats, chills weigh loss  ?HEENT: Denies blurred vision, double vision, ear pain, eye pain, hearing loss, nose bleeds, sore throat ?Cardiac:  No dizziness, chest pain or heaviness, chest tightness,edema ?Resp:   Denies cough or sputum porduction, shortness of breath,wheezing, hemoptysis,  ?Gi: Denies swallowing difficulty, stomach pain, nausea or vomiting, diarrhea, constipation, bowel incontinence ?Gu:  Denies bladder incontinence, burning urine ?Ext:   Denies Joint pain, stiffness or swelling ?Skin: Denies  skin rash, easy bruising or bleeding or hives ?Endoc:  Denies polyuria, polydipsia , polyphagia or weight change ?Psych:   Denies depression, insomnia or hallucinations  ? ?Other:  All other systems negative ? ? ?VS: BP (!) 143/97   Pulse 84   Temp 98.1 ?F (36.7 ?C)   Resp 17   Ht 6\' 1"  (1.854 m)   Wt 110.2 kg   SpO2 94%   BMI 32.06 kg/m?   ? ? ? ?PHYSICAL EXAM  ? ? ?GENERAL:NAD, no fevers, chills, no weakness no fatigue ?HEAD:  Normocephalic, atraumatic.  ?EYES: Pupils equal, round, reactive to light. Extraocular muscles intact. No scleral icterus.  ?MOUTH: Moist mucosal membrane. Dentition intact. No abscess noted.  ?EAR, NOSE, THROAT: Clear without exudates. No external lesions.  ?NECK: Supple. No thyromegaly. No nodules. No JVD.  ?PULMONARY: Diffuse wheezes, no ditress ? Baseline. ?CARDIOVASCULAR: S1 and S2. Regular rate and rhythm. No murmurs, rubs, or gallops. No edema. Pedal pulses 2+ bilaterally.  ?GASTROINTESTINAL: Soft, nontender, nondistended. No masses. Positive bowel sounds. No hepatosplenomegaly.  ?MUSCULOSKELETAL: No swelling, clubbing, or edema. Range of motion full in all  extremities.  ?NEUROLOGIC: Cranial nerves II through XII are intact. No gross focal neurological deficits. Sensation intact. Reflexes intact.  ?SKIN: No ulceration, lesions, rashes, or cyanosis. Skin warm and dry. Turgor intact.  ?PSYCHIATRIC: Mood, affect within normal limits. The patient is awake, alert and oriented x 3. Insight, judgment intact.  ? ? ?  ? ?IMAGING  ? ? ?CT CHEST W CONTRAST ? ?Result Date: 12/08/2021 ?CLINICAL DATA:  Concern for pneumonia EXAM: CT CHEST WITH CONTRAST TECHNIQUE: Multidetector CT imaging of the chest was performed during intravenous contrast administration. RADIATION DOSE REDUCTION: This exam was performed according to the departmental dose-optimization program which includes automated exposure control, adjustment of the mA and/or kV according to patient size and/or use of iterative reconstruction technique. CONTRAST:  29mL OMNIPAQUE IOHEXOL 300 MG/ML  SOLN COMPARISON:  CT chest dated October 06, 2020 FINDINGS: Cardiovascular: Normal heart size. Trace pericardial fluid. Normal caliber thoracic aorta with mild atherosclerotic disease. No suspicious filling defects of the central pulmonary arteries. Mediastinum/Nodes: Esophagus and thyroid are unremarkable. Mildly enlarged right lower paratracheal lymph node measuring 1.1 cm in  short axis on series 2, image 73, likely reactive. Lungs/Pleura: Central airways are patent. Bilateral right greater than left lower lung consolidations with a somewhat linear configuration. Irregular solid pulmonar

## 2021-12-13 NOTE — TOC Progression Note (Signed)
Transition of Care (TOC) - Progression Note  ? ? ?Patient Details  ?Name: Summit Arroyave Saulter ?MRN: [id number] ?Date of Birth: 01-Mar-1961 ? ?Transition of Care (TOC) CM/SW Contact  ?Pete Pelt, RN ?Phone Number: ?12/13/2021, 2:35 PM ? ?Clinical Narrative:   RNCM attempted to contact group home x2  No answer, waiting for callback ? ? ? ?  ?  ? ?Expected Discharge Plan and Services ?  ?  ?  ?  ?  ?                ?  ?  ?  ?  ?  ?  ?  ?  ?  ?  ? ? ?Social Determinants of Health (SDOH) Interventions ?  ? ?Readmission Risk Interventions ? ?  04/15/2020  ? 10:31 AM  ?Readmission Risk Prevention Plan  ?Transportation Screening Complete  ?PCP or Specialist Appt within 3-5 Days Complete  ?Social Work Consult for [place] Planning/Counseling Complete  ?Palliative Care Screening Not Applicable  ?Medication Review Press photographer) Complete  ? ? ?

## 2021-12-13 NOTE — Plan of Care (Signed)
?  Problem: Education: ?Goal: Knowledge of General Education information will improve ?Description: Including pain rating scale, medication(s)/side effects and non-pharmacologic comfort measures ?Outcome: Progressing ?  ?Problem: Activity: ?Goal: Risk for activity intolerance will decrease ?Outcome: Progressing ?  ?Problem: Nutrition: ?Goal: Adequate nutrition will be maintained ?Outcome: Progressing ?  ?Problem: Coping: ?Goal: Level of anxiety will decrease ?Outcome: Progressing ?  ?Problem: Elimination: ?Goal: Will not experience complications related to urinary retention ?Outcome: Progressing ?  ?Problem: Pain Managment: ?Goal: General experience of comfort will improve ?Outcome: Progressing ?  ?

## 2021-12-13 NOTE — TOC Progression Note (Signed)
Transition of Care (TOC) - Progression Note  ? ? ?Patient Details  ?Name: [name] ?MRN: [id number] ?Date of Birth: 18-Feb-1961 ? ?Transition of Care (TOC) CM/SW Contact  ?Alberteen Sam, LCSW ?Phone Number: ?12/13/2021, 3:19 PM ? ?Clinical Narrative:    ? ? ?CSW spoke to [facility] at  [phone], they report for [facility] to call [name] at [phone] as he will determine if patient can return. CSW has updated RNCM.  ?  ?  ? ?Expected Discharge Plan and Services ?  ?  ?  ?  ?  ?                ?  ?  ?  ?  ?  ?  ?  ?  ?  ?  ? ? ?Social Determinants of Health (SDOH) Interventions ?  ? ?Readmission Risk Interventions ? ?  04/15/2020  ? 10:31 AM  ?Readmission Risk Prevention Plan  ?Transportation Screening Complete  ?PCP or Specialist Appt within 3-5 Days Complete  ?Social Work Consult for [place] Planning/Counseling Complete  ?Palliative Care Screening Not Applicable  ?Medication Review Press photographer) Complete  ? ? ?

## 2021-12-14 ENCOUNTER — Ambulatory Visit: Payer: Medicare Other | Attending: Acute Care

## 2021-12-14 MED ORDER — PREDNISONE 10 MG PO TABS: ORAL_TABLET | ORAL | 0 refills | Status: AC

## 2021-12-14 MED ORDER — TRELEGY ELLIPTA 100-62.5-25 MCG/ACT IN AEPB: 1.0000 | INHALATION_SPRAY | Freq: Every day | RESPIRATORY_TRACT | 1 refills | Status: AC

## 2021-12-14 MED ORDER — NICOTINE 14 MG/24HR TD PT24: 14.0000 mg | MEDICATED_PATCH | Freq: Every day | TRANSDERMAL | 0 refills | Status: DC

## 2021-12-14 MED ORDER — FAMOTIDINE 20 MG PO TABS: 20.0000 mg | ORAL_TABLET | Freq: Every day | ORAL | 0 refills | Status: DC

## 2021-12-14 MED ORDER — THEREMS PO TABS: 1.0000 | ORAL_TABLET | Freq: Every day | ORAL | Status: DC

## 2021-12-14 MED ORDER — MONTELUKAST SODIUM 10 MG PO TABS: 10.0000 mg | ORAL_TABLET | Freq: Every day | ORAL | 1 refills | Status: DC

## 2021-12-14 NOTE — TOC Progression Note (Addendum)
Transition of Care (TOC) - Progression Note  ? ? ?Patient Details  ?Name: [name] ?MRN: [id number] ?Date of Birth: 07-Jun-1961 ? ?Transition of Care (TOC) CM/SW Contact  ?Pete Pelt, RN ?Phone Number: ?12/14/2021, 1:18 PM ? ?Clinical Narrative:    ?Patient can return to group home today as per facilitator.  They will arrive approximately 1400-1500 today, care team aware.  Care team has verified that patient does not need home oxygen. ? ? ? Addendum 1329 [facility] will provide services to patient per Floydene Flock ?  ? ?Expected Discharge Plan and Services ?  ?  ?  ?  ?  ?Expected Discharge Date: 12/14/21               ?  ?  ?  ?  ?  ?  ?  ?  ?  ?  ? ? ?Social Determinants of Health (SDOH) Interventions ?  ? ?Readmission Risk Interventions ? ?  04/15/2020  ? 10:31 AM  ?Readmission Risk Prevention Plan  ?Transportation Screening Complete  ?PCP or Specialist Appt within 3-5 Days Complete  ?Social Work Consult for [place] Planning/Counseling Complete  ?Palliative Care Screening Not Applicable  ?Medication Review Press photographer) Complete  ? ? ?

## 2021-12-14 NOTE — Discharge Summary (Addendum)
Physician Discharge Summary   Patient: [name] MRN: [id number] DOB: 10/31/1960  Admit date:     12/06/2021  Discharge date: 12/14/2021  Discharge Physician: Ezekiel Slocumb   PCP: Donnie Coffin, MD   Recommendations at discharge:    Follow up with PCP in 1 week Repeat CBC, BMP in 1 week Follow up with pulmonology Continue to assess if home oxygen is needed (did not qualify at time of discharge)  Discharge Diagnoses: Principal Problem:   COPD exacerbation ([place]) Active Problems:   Essential hypertension   Schizophrenia ([place])   Nicotine dependence   Pulmonary nodule, right   Leukocytosis  Resolved Problems:   Acute on chronic respiratory failure with hypoxia [facility])  Hospital Course: [name] is a 62 y.o. male with a PMH significant for schizophrenia, COPD, nicotine dependence, HTN.   They presented from home to the ED on 12/06/2021 with SOB x 1 days. Endorsed productive cough and DOE. Prescribed course of Abx outpatient for upper respiratory tract infection prior to presentation but did not complete.   In the ED, it was found that they had hypoxia on room air at rest with O2 saturations in high 80s and improved on 2L O2. His other vital signs remained stable.  Significant findings included chest xray without acute process identified but evidence of chronic emphysema.   They were initially treated with guaifensin, duonebs, IV methoprednisolone, CTX, magnesium.  Patient was admitted to medicine service for further workup and management of COPD exacerbation as outlined in detail below.   12/14/21 - weaned off oxygen with stable O2 sats at rest and with ambulation.  Patient is medically stable for discharge with PCP and pulmonology follow ups.  Assessment and Plan: Acute hypoxic respiratory failure  COPD  tobacco use-no oxygen use at baseline.  Patient required up to 3 L O2 supplementation this admission and has improved to 2 L today.  Continues to have significant  adventitious lung sounds.  Respiratory panel positive for rhino/enterovirus. -Wean oxygen as tolerated -Continue DuoNebs every 6 hours and as needed -Completed IV steroids during admission -Added pulmicort nebs BID -Continue ICS/SABA -Continue Abx -Nicotine replacement PRN -Pulmonology consulted - close outpatient follow up   Overall has been slow to improve, likely due to +rhinovirus infection.  5/9 - weaned off oxygen - stable for discharge   Right upper lobe lung nodule -seen on CT chest 5/3.  Irregular solid pulmonary nodule of the right upper lobe measuring 1.0 x 0.5 cm, slightly increased from previous size of 0.6 x 0.5 cm --Pulmonology consulted   Schizophrenia-chronic, stable -Continue home Seroquel and mirtazapine   HTN-chronic, well controlled on home therapies. -Discontinue home atenolol to avoid contribution to respiratory exacerbation -Continue home HCTZ and Cardizem         Consultants: pulmonology Procedures performed: none  Disposition: Group home Diet recommendation:  Discharge Diet Orders (From admission, onward)     Start     Ordered   12/14/21 0000  Diet - low sodium heart healthy        12/14/21 1002           Cardiac diet DISCHARGE MEDICATION: Allergies as of 12/14/2021   No Known Allergies      Medication List     STOP taking these medications    atenolol 50 MG tablet Commonly known as: TENORMIN   budesonide-formoterol 160-4.5 MCG/ACT inhaler Commonly known as: SYMBICORT   ibuprofen 800 MG tablet Commonly known as: ADVIL   potassium chloride  SA 20 MEQ tablet Commonly known as: KLOR-CON M   tiotropium 18 MCG inhalation capsule Commonly known as: SPIRIVA       TAKE these medications    acetaminophen 325 MG tablet Commonly known as: TYLENOL Take 650 mg by mouth 2 (two) times daily as needed for mild pain.   amantadine 100 MG capsule Commonly known as: SYMMETREL Take 100 mg by mouth 2 (two) times daily.   aspirin EC  81 MG tablet Take 81 mg by mouth daily. Swallow whole.   B-6 100 MG Tabs Take by mouth daily.   clopidogrel 75 MG tablet Commonly known as: PLAVIX Take 75 mg by mouth daily.   dextromethorphan-guaiFENesin 10-100 MG/5ML liquid Commonly known as: ROBITUSSIN-DM Take 10 mLs by mouth every 6 (six) hours as needed for cough.   diltiazem 180 MG 24 hr capsule Commonly known as: CARDIZEM CD Take 180 mg by mouth daily.   docusate sodium 100 MG capsule Commonly known as: COLACE Take 100 mg by mouth 2 (two) times daily as needed for mild constipation.   famotidine 20 MG tablet Commonly known as: PEPCID Take 1 tablet (20 mg total) by mouth daily.   Fish Oil 1000 MG Cpdr Take by mouth.   gabapentin 100 MG capsule Commonly known as: NEURONTIN Take 100 mg by mouth at bedtime.   guaiFENesin 600 MG 12 hr tablet Commonly known as: MUCINEX Take 600 mg by mouth 2 (two) times daily.   hydrochlorothiazide 12.5 MG tablet Commonly known as: HYDRODIURIL Take 12.5 mg by mouth daily.   Lorayne Bender Trinza 819 MG/2.63ML injection Generic drug: paliperidone Palmitate ER Inject 819 mg into the muscle every 3 (three) months.   ipratropium-albuterol 0.5-2.5 (3) MG/3ML Soln Commonly known as: DUONEB Take 3 mLs by nebulization every 6 (six) hours as needed (every 4 to 6 hours as needed for shortnes of breath or wheeziing).   lovastatin 20 MG tablet Commonly known as: MEVACOR Take 20 mg by mouth at bedtime.   melatonin 5 MG Tabs Take 1 tablet by mouth at bedtime as needed.   mirtazapine 30 MG tablet Commonly known as: REMERON Take 30 mg by mouth at bedtime.   montelukast 10 MG tablet Commonly known as: SINGULAIR Take 1 tablet (10 mg total) by mouth at bedtime.   nicotine 14 mg/24hr patch Commonly known as: NICODERM CQ - dosed in mg/24 hours Place 1 patch (14 mg total) onto the skin daily. Do not use if actively smoking cigarettes.   QUEtiapine 200 MG tablet Commonly known as:  SEROQUEL Take 200 mg by mouth at bedtime.   Therems Tabs Take 1 tablet by mouth daily. What changed: how much to take   Trelegy Ellipta 100-62.5-25 MCG/ACT Aepb Generic drug: Fluticasone-Umeclidin-Vilant Inhale 1 puff into the lungs daily.   VITAMIN D (ERGOCALCIFEROL) PO Take 1,000 Units by mouth daily.       ASK your doctor about these medications    predniSONE 10 MG tablet Commonly known as: DELTASONE Take 3 tablets (30 mg total) by mouth daily with breakfast for 2 days, THEN 2 tablets (20 mg total) daily with breakfast for 2 days, THEN 1 tablet (10 mg total) daily with breakfast for 2 days. Start taking on: Dec 14, 2021 Ask about: Should I take this medication?        Follow-up Information     Erby Pian, MD. Schedule an appointment as soon as possible for a visit in 2 week(s).   Specialty: Specialist Why: Hospital follow up for  COPD exacerbation  Facility to make follow up appt Contact information: [place] [address] [phone]         Donnie Coffin, MD. Schedule an appointment as soon as possible for a visit in 1 week(s).   Specialty: Family Medicine Why: Hospital follow up  Facility to make follow up appt Contact information: [place] [address] [phone]                Discharge Exam: Filed Weights   12/06/21 0755  Weight: 110.2 kg   General exam: awake, alert, no acute distress HEENT:moist mucus membranes, hearing grossly normal  Respiratory system: CTAB, no wheezes, rales or rhonchi, normal respiratory effort. Cardiovascular system: normal S1/S2, RRR  Gastrointestinal system: soft, NT, ND Central nervous system:  no gross focal neurologic deficits, normal speech Extremities: moves all, no edema, normal tone Skin: dry, intact, normal temperature Psychiatry: normal mood, flat affect   Condition at discharge: stable  The results of significant diagnostics from this  hospitalization (including imaging, microbiology, ancillary and laboratory) are listed below for reference.   Imaging Studies: CT CHEST W CONTRAST  Result Date: 12/08/2021 CLINICAL DATA:  Concern for pneumonia EXAM: CT CHEST WITH CONTRAST TECHNIQUE: Multidetector CT imaging of the chest was performed during intravenous contrast administration. RADIATION DOSE REDUCTION: This exam was performed according to the departmental dose-optimization program which includes automated exposure control, adjustment of the mA and/or kV according to patient size and/or use of iterative reconstruction technique. CONTRAST:  29mL OMNIPAQUE IOHEXOL 300 MG/ML  SOLN COMPARISON:  CT chest dated October 06, 2020 FINDINGS: Cardiovascular: Normal heart size. Trace pericardial fluid. Normal caliber thoracic aorta with mild atherosclerotic disease. No suspicious filling defects of the central pulmonary arteries. Mediastinum/Nodes: Esophagus and thyroid are unremarkable. Mildly enlarged right lower paratracheal lymph node measuring 1.1 cm in short axis on series 2, image 73, likely reactive. Lungs/Pleura: Central airways are patent. Bilateral right greater than left lower lung consolidations with a somewhat linear configuration. Irregular solid pulmonary nodule of the right upper lobe measuring 1.0 x 0.5 cm on series 3, image 60, previously measured 0.6 x 0.5 cm. Upper Abdomen: Bilateral adrenal nodules are unchanged in size when compared with prior exam, likely benign adenomas. Unchanged small left mild lipoma. Partially visualized low-attenuation lesion of the right kidney, likely a simple cyst. No acute abnormality. Musculoskeletal: No chest wall abnormality. No acute or significant osseous findings. IMPRESSION: 1. 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 Consultation with Pulmonology or Thoracic Surgery recommended. 2.  Bibasilar consolidations with a somewhat linear configuration, favor resolving sequela of prior infection or aspiration. 3. Mildly enlarged right lower paratracheal lymph node, likely reactive. 4. Aortic Atherosclerosis (ICD10-I70.0) and Emphysema (ICD10-J43.9). Electronically Signed   By: Yetta Glassman M.D.   On: 12/08/2021 11:39   DG Chest Port 1 View  Result Date: 12/06/2021 CLINICAL DATA:  Worsened shortness of breath over the last month. History of asthma and COPD. EXAM: PORTABLE CHEST 1 VIEW COMPARISON:  Radiography 04/19/2020.  CT 10/06/2020. FINDINGS: Heart size is normal. Mediastinal shadows show mild aortic atherosclerotic calcification. Upper lobe emphysema with crowding of markings and mild scarring at the lung bases as seen previously. Small nodular opacities being followed by CT are not visible by radiography. No evidence of consolidation, collapse, effusion or edema. IMPRESSION: Chronic findings of COPD as above. No acute process  identifiable by radiography. Electronically Signed   By: Nelson Chimes M.D.   On: 12/06/2021 08:11    Microbiology: Results for orders placed or performed during the hospital encounter of 12/06/21  Resp Panel by RT-PCR (Flu A&B, Covid) Nasopharyngeal Swab     Status: None   Collection Time: 12/06/21  8:33 AM   Specimen: Nasopharyngeal Swab; Nasopharyngeal(NP) swabs in vial transport medium  Result Value Ref Range Status   SARS Coronavirus 2 by RT PCR NEGATIVE NEGATIVE Final    Comment: (NOTE) SARS-CoV-2 target nucleic acids are NOT DETECTED.  The SARS-CoV-2 RNA is generally detectable in upper respiratory specimens during the acute phase of infection. The lowest concentration of SARS-CoV-2 viral copies this assay can detect is 138 copies/mL. A negative result does not preclude SARS-Cov-2 infection and should not be used as the sole basis for treatment or other patient management decisions. A negative result may occur with  improper specimen  collection/handling, submission of specimen other than nasopharyngeal swab, presence of viral mutation(s) within the areas targeted by this assay, and inadequate number of viral copies(<138 copies/mL). A negative result must be combined with clinical observations, patient history, and epidemiological information. The expected result is Negative.  Fact Sheet for Patients:  EntrepreneurPulse.com.au  Fact Sheet for Healthcare Providers:  IncredibleEmployment.be  This test is no t yet approved or cleared by the [place] FDA and  has been authorized for detection and/or diagnosis of SARS-CoV-2 by FDA under an Emergency Use Authorization (EUA). This EUA will remain  in effect (meaning this test can be used) for the duration of the COVID-19 declaration under Section 564(b)(1) of the Act, 21 U.S.C.section 360bbb-3(b)(1), unless the authorization is terminated  or revoked sooner.       Influenza A by PCR NEGATIVE NEGATIVE Final   Influenza B by PCR NEGATIVE NEGATIVE Final    Comment: (NOTE) The Xpert Xpress SARS-CoV-2/FLU/RSV plus assay is intended as an aid in the diagnosis of influenza from Nasopharyngeal swab specimens and should not be used as a sole basis for treatment. Nasal washings and aspirates are unacceptable for Xpert Xpress SARS-CoV-2/FLU/RSV testing.  Fact Sheet for Patients: EntrepreneurPulse.com.au  Fact Sheet for Healthcare Providers: IncredibleEmployment.be  This test is not yet approved or cleared by the [place] FDA and has been authorized for detection and/or diagnosis of SARS-CoV-2 by FDA under an Emergency Use Authorization (EUA). This EUA will remain in effect (meaning this test can be used) for the duration of the COVID-19 declaration under Section 564(b)(1) of the Act, 21 U.S.C. section 360bbb-3(b)(1), unless the authorization is terminated or revoked.  Performed at [facility], [place], [place] [address]   Respiratory (~20 pathogens) panel by PCR     Status: Abnormal   Collection Time: 12/06/21 10:07 AM   Specimen: Nasopharyngeal Swab; Respiratory  Result Value Ref Range Status   Adenovirus NOT DETECTED NOT DETECTED Final   Coronavirus 229E NOT DETECTED NOT DETECTED Final    Comment: (NOTE) The Coronavirus on the Respiratory Panel, DOES NOT test for the novel  Coronavirus (2019 nCoV)    Coronavirus HKU1 NOT DETECTED NOT DETECTED Final   Coronavirus NL63 NOT DETECTED NOT DETECTED Final   Coronavirus OC43 NOT DETECTED NOT DETECTED Final   Metapneumovirus NOT DETECTED NOT DETECTED Final   Rhinovirus / Enterovirus DETECTED (A) NOT DETECTED Final   Influenza A NOT DETECTED NOT DETECTED Final   Influenza B NOT DETECTED NOT DETECTED Final   Parainfluenza Virus 1 NOT DETECTED  NOT DETECTED Final   Parainfluenza Virus 2 NOT DETECTED NOT DETECTED Final   Parainfluenza Virus 3 NOT DETECTED NOT DETECTED Final   Parainfluenza Virus 4 NOT DETECTED NOT DETECTED Final   Respiratory Syncytial Virus NOT DETECTED NOT DETECTED Final   Bordetella pertussis NOT DETECTED NOT DETECTED Final   Bordetella Parapertussis NOT DETECTED NOT DETECTED Final   Chlamydophila pneumoniae NOT DETECTED NOT DETECTED Final   Mycoplasma pneumoniae NOT DETECTED NOT DETECTED Final    Comment: Performed at [facility], [place] [address], [place], [place] [address]    Labs: CBC: No results for input(s): WBC, NEUTROABS, HGB, HCT, MCV, PLT in the last 168 hours.  Basic Metabolic Panel: No results for input(s): NA, K, CL, CO2, GLUCOSE, BUN, CREATININE, CALCIUM, MG, PHOS in the last 168 hours.  Liver Function Tests: No results for input(s): AST, ALT, ALKPHOS, BILITOT, PROT, ALBUMIN in the last 168 hours. CBG: No results for input(s): GLUCAP in the last 168 hours.  Discharge time spent: less than 30 minutes.  Signed: Ezekiel Slocumb, DO [facility] 12/24/2021

## 2021-12-14 NOTE — Care Management Important Message (Signed)
Important Message ? ?Patient Details  ?Name: [name] ?MRN: [id number] ?Date of Birth: [birth year] ? ? ?Medicare Important Message Given:  Yes ? ?Patient is in an isolation room and I reviewed the Important Message from Medicare with him by phone [phone] and he is in agreement with his discharge.  I wished him well and thanked him for his time. ? ?[name] ?12/14/2021, 10:15 AM ?

## 2021-12-14 NOTE — Progress Notes (Signed)
SATURATION QUALIFICATIONS: (This note is used to comply with regulatory documentation for home oxygen) ? ?Patient Saturations on Room Air at Rest = 94% ? ?Patient Saturations on Room Air while Ambulating = 92% ? ? ?

## 2021-12-14 NOTE — TOC Progression Note (Signed)
Transition of Care (TOC) - Progression Note  ? ? ?Patient Details  ?Name: [name] ?MRN: [id number] ?Date of Birth: 1961-06-01 ? ?Transition of Care (TOC) CM/SW Contact  ?Pete Pelt, RN ?Phone Number: ?12/14/2021, 9:45 AM ? ?Clinical Narrative:   Spoke to patient's group home, [facility] facilitator Tammy who stated they can accept patient home with or without oxygen  As per care team they will walk patient today and determine if he needs oxygen.   ? ?Awaiting oxygen determination and discharge.  Facility plans to transport when patient is discharged. ? ? ? ?  ?  ? ?Expected Discharge Plan and Services ?  ?  ?  ?  ?  ?                ?  ?  ?  ?  ?  ?  ?  ?  ?  ?  ? ? ?Social Determinants of Health (SDOH) Interventions ?  ? ?Readmission Risk Interventions ? ?  04/15/2020  ? 10:31 AM  ?Readmission Risk Prevention Plan  ?Transportation Screening Complete  ?PCP or Specialist Appt within 3-5 Days Complete  ?Social Work Consult for [place] Planning/Counseling Complete  ?Palliative Care Screening Not Applicable  ?Medication Review Press photographer) Complete  ? ? ?

## 2021-12-15 LAB — QUANTIFERON-TB GOLD PLUS (RQFGPL)
QuantiFERON Mitogen Value: 5.65 IU/mL
QuantiFERON Nil Value: 0.07 IU/mL
QuantiFERON TB1 Ag Value: 0.07 IU/mL
QuantiFERON TB2 Ag Value: 0.07 IU/mL

## 2021-12-15 LAB — QUANTIFERON-TB GOLD PLUS: QuantiFERON-TB Gold Plus: NEGATIVE

## 2021-12-23 ENCOUNTER — Ambulatory Visit: Payer: Medicare Other | Admitting: Podiatry

## 2021-12-24 ENCOUNTER — Encounter: Payer: Self-pay | Admitting: Internal Medicine

## 2021-12-24 ENCOUNTER — Other Ambulatory Visit: Payer: Self-pay | Admitting: Specialist

## 2021-12-24 DIAGNOSIS — R918 Other nonspecific abnormal finding of lung field: Secondary | ICD-10-CM

## 2021-12-27 ENCOUNTER — Ambulatory Visit (INDEPENDENT_AMBULATORY_CARE_PROVIDER_SITE_OTHER): Payer: Medicare Other | Admitting: Podiatry

## 2021-12-27 ENCOUNTER — Encounter: Payer: Self-pay | Admitting: Podiatry

## 2021-12-27 DIAGNOSIS — D689 Coagulation defect, unspecified: Secondary | ICD-10-CM

## 2021-12-27 DIAGNOSIS — B351 Tinea unguium: Secondary | ICD-10-CM

## 2021-12-27 DIAGNOSIS — M79674 Pain in right toe(s): Secondary | ICD-10-CM | POA: Diagnosis not present

## 2021-12-27 DIAGNOSIS — M79675 Pain in left toe(s): Secondary | ICD-10-CM | POA: Diagnosis not present

## 2021-12-27 NOTE — Progress Notes (Signed)
This patient returns to my office for at risk foot care.  This patient requires this care by a professional since this patient will be at risk due to having coagulation defect due to plavix.  This patient is unable to cut nails himself since the patient cannot reach his nails.These nails are painful walking and wearing shoes.  This patient presents for at risk foot care today.  General Appearance  Alert, conversant and in no acute stress.  Vascular  Dorsalis pedis and posterior tibial  pulses are palpable  bilaterally.  Capillary return is within normal limits  bilaterally. Temperature is within normal limits  bilaterally.  Neurologic  Senn-Weinstein monofilament wire test within normal limits  bilaterally. Muscle power within normal limits bilaterally.  Nails Thick disfigured discolored nails with subungual debris  from hallux to fifth toes bilaterally. No evidence of bacterial infection or drainage bilaterally.  Orthopedic  No limitations of motion  feet .  No crepitus or effusions noted.  No bony pathology or digital deformities noted.  Skin  normotropic skin with no porokeratosis noted bilaterally.  No signs of infections or ulcers noted.     Onychomycosis  Pain in right toes  Pain in left toes  Consent was obtained for treatment procedures.   Mechanical debridement of nails 1-5  bilaterally performed with a nail nipper.  Filed with dremel without incident.     Return office visit    3 months                 Told patient to return for periodic foot care and evaluation due to potential at risk complications.   Gardiner Barefoot DPM

## 2021-12-28 ENCOUNTER — Encounter: Payer: Self-pay | Admitting: *Deleted

## 2021-12-28 NOTE — Progress Notes (Signed)
Message left with facility to review upcoming appts for PET scan and new patient visit with Dr. Baruch Gouty. Both appts confirmed.

## 2022-01-04 ENCOUNTER — Encounter
Admission: RE | Admit: 2022-01-04 | Discharge: 2022-01-04 | Disposition: A | Discharge status: Home / Self Care | Payer: Medicare Other | Source: Ambulatory Visit | Attending: Family Medicine | Admitting: Family Medicine

## 2022-01-04 DIAGNOSIS — R918 Other nonspecific abnormal finding of lung field: Secondary | ICD-10-CM | POA: Diagnosis present

## 2022-01-04 LAB — GLUCOSE, CAPILLARY: Glucose-Capillary: 128 mg/dL — ABNORMAL HIGH (ref 70–99)

## 2022-01-04 MED ORDER — FLUDEOXYGLUCOSE F - 18 (FDG) INJECTION
10.6000 | Freq: Once | INTRAVENOUS | Status: AC | PRN
  Administered 2022-01-04: 11.52 via INTRAVENOUS

## 2022-01-07 ENCOUNTER — Other Ambulatory Visit: Payer: Medicare Other

## 2022-01-07 ENCOUNTER — Ambulatory Visit
Admission: RE | Admit: 2022-01-07 | Discharge: 2022-01-07 | Disposition: A | Discharge status: Home / Self Care | Payer: Medicare Other | Source: Ambulatory Visit | Attending: Radiation Oncology | Admitting: Radiation Oncology

## 2022-01-07 ENCOUNTER — Ambulatory Visit: Payer: Medicare Other | Admitting: Internal Medicine

## 2022-01-07 ENCOUNTER — Encounter: Payer: Self-pay | Admitting: *Deleted

## 2022-01-07 ENCOUNTER — Encounter: Payer: Self-pay | Admitting: Radiation Oncology

## 2022-01-07 VITALS — BP 132/94 | HR 108 | Temp 96.4°F | Resp 22 | Ht 73.0 in | Wt 205.8 lb

## 2022-01-07 DIAGNOSIS — J432 Centrilobular emphysema: Secondary | ICD-10-CM | POA: Insufficient documentation

## 2022-01-07 DIAGNOSIS — Z7982 Long term (current) use of aspirin: Secondary | ICD-10-CM | POA: Diagnosis not present

## 2022-01-07 DIAGNOSIS — R918 Other nonspecific abnormal finding of lung field: Secondary | ICD-10-CM

## 2022-01-07 DIAGNOSIS — K449 Diaphragmatic hernia without obstruction or gangrene: Secondary | ICD-10-CM | POA: Insufficient documentation

## 2022-01-07 DIAGNOSIS — C3411 Malignant neoplasm of upper lobe, right bronchus or lung: Secondary | ICD-10-CM | POA: Diagnosis not present

## 2022-01-07 DIAGNOSIS — F209 Schizophrenia, unspecified: Secondary | ICD-10-CM | POA: Insufficient documentation

## 2022-01-07 DIAGNOSIS — I10 Essential (primary) hypertension: Secondary | ICD-10-CM | POA: Insufficient documentation

## 2022-01-07 DIAGNOSIS — Z79899 Other long term (current) drug therapy: Secondary | ICD-10-CM | POA: Diagnosis not present

## 2022-01-07 DIAGNOSIS — Z87891 Personal history of nicotine dependence: Secondary | ICD-10-CM | POA: Insufficient documentation

## 2022-01-07 NOTE — Consult Note (Signed)
NEW PATIENT EVALUATION  Name: [name]  MRN: [id number]  Date:   01/07/2022     DOB: [birth year]   This 61 y.o. 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  REFERRING PHYSICIAN: Aycock, Edmonia Lynch, MD  CHIEF COMPLAINT: No chief complaint on file.   DIAGNOSIS: The encounter diagnosis was Lung mass.   PREVIOUS INVESTIGATIONS:  CT scans and PET CT scans reviewed Labs reviewed Clinical note reviewed  HPI: Patient is a 61 year old male with history of schizophrenia also advanced COPD.  He has been followed by Dr. Raul Del for a progressive lesion in the right upper lobe increasing in size over time concerning for primary bronchogenic carcinoma.  PET CT scan showed hypermetabolic activity in the right upper lobe lesion again confirming primary bronchogenic carcinoma.  He had 2 small right paratracheal nodes favored to be reactive but warranting for attention on follow-up.  Patient is asymptomatic specifically [place] cough hemoptysis or chest tightness he does have significant dyspnea on exertion.  PLANNED TREATMENT REGIMEN: SBRT  PAST MEDICAL HISTORY:  has a past medical history of Acute on chronic respiratory failure with hypoxia ([place]), Asthma, COPD (chronic obstructive pulmonary disease) ([place]), Depression, History of hiatal hernia, Hypertension, Pneumonia, Schizophrenia ([place]), Shortness of breath dyspnea, Stroke ([place]), and Umbilical hernia.    PAST SURGICAL HISTORY:  Past Surgical History:  Procedure Laterality Date   COLONOSCOPY WITH PROPOFOL N/A 09/21/2021   Procedure: COLONOSCOPY WITH PROPOFOL;  Surgeon: Lesly Rubenstein, MD;  Location: [facility] END[facility]COPY;  Service: Endoscopy;  Laterality: N/A;   HERNIA REPAIR     INSERTION OF MESH N/A 12/18/2014   Procedure: INSERTION OF MESH;  Surgeon: Florene Glen, MD;  Location: [facility] ORS;  Service: General;  Laterality: N/A;    SUPRA-UMBILICAL HERNIA  09/09/5425   Procedure: SUPRA-UMBILICAL HERNIA;  Surgeon: Florene Glen, MD;  Location: [facility] ORS;  Service: General;;   UMBILICAL HERNIA REPAIR N/A 12/18/2014   Procedure: HERNIA REPAIR UMBILICAL ADULT;  Surgeon: Florene Glen, MD;  Location: [facility] ORS;  Service: General;  Laterality: N/A;    FAMILY HISTORY: family history includes Asthma in his mother; Hypertension in his father.  SOCIAL HISTORY:  reports that he has been smoking cigarettes. He has a 11.25 pack-year smoking history. He has never used smokeless tobacco. He reports that he does not currently use alcohol after a past usage of about 75.0 standard drinks per week. He reports that he does not currently use drugs.  ALLERGIES: Patient has no known allergies.  MEDICATIONS:  Current Outpatient Medications  Medication Sig Dispense Refill   acetaminophen (TYLENOL) 325 MG tablet Take 650 mg by mouth 2 (two) times daily as needed for mild pain.     amantadine (SYMMETREL) 100 MG capsule Take 100 mg by mouth 2 (two) times daily.     aspirin EC 81 MG tablet Take 81 mg by mouth daily. Swallow whole.     clopidogrel (PLAVIX) 75 MG tablet Take 75 mg by mouth daily.     dextromethorphan-guaiFENesin (ROBITUSSIN-DM) 10-100 MG/5ML liquid Take 10 mLs by mouth every 6 (six) hours as needed for cough.     diltiazem (CARDIZEM CD) 180 MG 24 hr capsule Take 180 mg by mouth daily.      docusate sodium (COLACE) 100 MG capsule Take 100 mg by mouth 2 (two) times daily as needed for mild constipation.     famotidine (  PEPCID) 20 MG tablet Take 1 tablet (20 mg total) by mouth daily. 30 tablet 0   Fluticasone-Umeclidin-Vilant (TRELEGY ELLIPTA) 100-62.5-25 MCG/ACT AEPB Inhale 1 puff into the lungs daily. 28 each 1   gabapentin (NEURONTIN) 100 MG capsule Take 100 mg by mouth at bedtime.      guaiFENesin (MUCINEX) 600 MG 12 hr tablet Take 600 mg by mouth 2 (two) times daily.     hydrochlorothiazide (HYDRODIURIL) 12.5 MG tablet Take  12.5 mg by mouth daily.     INVEGA TRINZA 819 MG/2.625ML SUSY Inject 819 mg into the muscle every 3 (three) months.      ipratropium-albuterol (DUONEB) 0.5-2.5 (3) MG/3ML SOLN Take 3 mLs by nebulization every 6 (six) hours as needed (every 4 to 6 hours as needed for shortnes of breath or wheeziing).      lovastatin (MEVACOR) 20 MG tablet Take 20 mg by mouth at bedtime.     Melatonin 5 MG TABS Take 1 tablet by mouth at bedtime as needed.     mirtazapine (REMERON) 30 MG tablet Take 30 mg by mouth at bedtime.     montelukast (SINGULAIR) 10 MG tablet Take 1 tablet (10 mg total) by mouth at bedtime. 30 tablet 1   Multiple Vitamin (THEREMS) TABS Take 1 tablet by mouth daily.     nicotine (NICODERM CQ - DOSED IN MG/24 HOURS) 14 mg/24hr patch Place 1 patch (14 mg total) onto the skin daily. Do not use if actively smoking cigarettes. 28 patch 0   Omega-3 Fatty Acids (FISH OIL) 1000 MG CPDR Take by mouth.     Pyridoxine HCl (B-6) 100 MG TABS Take by mouth daily.     QUEtiapine (SEROQUEL) 200 MG tablet Take 200 mg by mouth at bedtime.     VITAMIN D, ERGOCALCIFEROL, PO Take 1,000 Units by mouth daily.      No current facility-administered medications for this encounter.    ECOG PERFORMANCE STATUS:  0 - Asymptomatic  REVIEW OF SYSTEMS: Patient does have history of acute on chronic respiratory failure COPD history of depression history of schizophrenia and marked dyspnea on exertion. Patient denies any weight loss, fatigue, weakness, fever, chills or night sweats. Patient denies any loss of vision, blurred vision. Patient denies any ringing  of the ears or hearing loss. No irregular heartbeat. Patient denies heart murmur or history of fainting. Patient denies any chest pain or pain radiating to her upper extremities. Patient denies any shortness of breath, difficulty breathing at night, cough or hemoptysis. Patient denies any swelling in the lower legs. Patient denies any nausea vomiting, vomiting of blood, or  coffee ground material in the vomitus. Patient denies any stomach pain. Patient states has had normal bowel movements no significant constipation or diarrhea. Patient denies any dysuria, hematuria or significant nocturia. Patient denies any problems walking, swelling in the joints or loss of balance. Patient denies any skin changes, loss of hair or loss of weight. Patient denies any excessive worrying or anxiety or significant depression. Patient denies any problems with insomnia. Patient denies excessive thirst, polyuria, polydipsia. Patient denies any swollen glands, patient denies easy bruising or easy bleeding. Patient denies any recent infections, allergies or URI. Patient "s visual fields have not changed significantly in recent time.   PHYSICAL EXAM: BP (!) 132/94   Pulse (!) 108   Temp (!) 96.4 F (35.8 C)   Resp (!) 22   Ht 6\' 1"  (1.854 m)   Wt 205 lb 12.8 oz (93.4 kg)  BMI 27.15 kg/m  Well-developed well-nourished patient in NAD. HEENT reveals PERLA, EOMI, discs not visualized.  Oral cavity is clear. No oral mucosal lesions are identified. Neck is clear without evidence of cervical or supraclavicular adenopathy. Lungs are clear to A&P. Cardiac examination is essentially unremarkable with regular rate and rhythm without murmur rub or thrill. Abdomen is benign with no organomegaly or masses noted. Motor sensory and DTR levels are equal and symmetric in the upper and lower extremities. Cranial nerves II through XII are grossly intact. Proprioception is intact. No peripheral adenopathy or edema is identified. No motor or sensory levels are noted. Crude visual fields are within normal range.  LABORATORY DATA: Labs reviewed    RADIOLOGY RESULTS: Serial CT scans and PET CT scans reviewed compatible with above-stated findings.   IMPRESSION: Stage I non-small cell lung cancer of the right upper lobe in 61 year old male with history of schizophrenia.  PLAN: This time I have recommended SBRT to  his right upper lobe lesion.  Would use for dimensional treatment planning as well as motion restriction in our simulation.  Risks and benefits of treatment including extremely low side effect profile for SBRT.  He may develop a cough about a month out may be some fatigue and should have no significant change in his pulmonary function after SBRT.  Patient comprehends my recommendations well if personally set up and ordered CT simulation.  I would like to take this opportunity to thank you for allowing me to participate in the care of your patient.Noreene Filbert, MD

## 2022-01-18 ENCOUNTER — Ambulatory Visit
Admission: RE | Admit: 2022-01-18 | Discharge: 2022-01-18 | Disposition: A | Discharge status: Home / Self Care | Payer: Medicare Other | Source: Ambulatory Visit | Attending: Radiation Oncology | Admitting: Radiation Oncology

## 2022-01-18 ENCOUNTER — Encounter: Payer: Self-pay | Admitting: *Deleted

## 2022-01-18 DIAGNOSIS — C3411 Malignant neoplasm of upper lobe, right bronchus or lung: Secondary | ICD-10-CM | POA: Insufficient documentation

## 2022-01-18 DIAGNOSIS — Z510 Encounter for antineoplastic radiation therapy: Secondary | ICD-10-CM | POA: Insufficient documentation

## 2022-01-24 DIAGNOSIS — Z510 Encounter for antineoplastic radiation therapy: Secondary | ICD-10-CM | POA: Diagnosis not present

## 2022-01-27 ENCOUNTER — Other Ambulatory Visit: Payer: Self-pay

## 2022-01-27 ENCOUNTER — Encounter: Payer: Self-pay | Admitting: *Deleted

## 2022-01-27 ENCOUNTER — Ambulatory Visit
Admission: RE | Admit: 2022-01-27 | Discharge: 2022-01-27 | Disposition: A | Discharge status: Home / Self Care | Payer: Medicare Other | Source: Ambulatory Visit | Attending: Radiation Oncology | Admitting: Radiation Oncology

## 2022-01-27 DIAGNOSIS — Z510 Encounter for antineoplastic radiation therapy: Secondary | ICD-10-CM | POA: Diagnosis not present

## 2022-01-27 LAB — RAD ONC ARIA SESSION SUMMARY
Course Elapsed Days: 0
Plan Fractions Treated to Date: 1
Plan Prescribed Dose Per Fraction: 12 Gy
Plan Total Fractions Prescribed: 5
Plan Total Prescribed Dose: 60 Gy
Reference Point Dosage Given to Date: 12 Gy
Reference Point Session Dosage Given: 12 Gy
Session Number: 1

## 2022-01-31 ENCOUNTER — Other Ambulatory Visit: Payer: Self-pay

## 2022-01-31 ENCOUNTER — Ambulatory Visit
Admission: RE | Admit: 2022-01-31 | Discharge: 2022-01-31 | Disposition: A | Discharge status: Home / Self Care | Payer: Medicare Other | Source: Ambulatory Visit | Attending: Radiation Oncology | Admitting: Radiation Oncology

## 2022-01-31 DIAGNOSIS — Z510 Encounter for antineoplastic radiation therapy: Secondary | ICD-10-CM | POA: Diagnosis not present

## 2022-01-31 LAB — RAD ONC ARIA SESSION SUMMARY
Course Elapsed Days: 4
Plan Fractions Treated to Date: 2
Plan Prescribed Dose Per Fraction: 12 Gy
Plan Total Fractions Prescribed: 5
Plan Total Prescribed Dose: 60 Gy
Reference Point Dosage Given to Date: 24 Gy
Reference Point Session Dosage Given: 12 Gy
Session Number: 2

## 2022-02-02 ENCOUNTER — Ambulatory Visit
Admission: RE | Admit: 2022-02-02 | Discharge: 2022-02-02 | Disposition: A | Discharge status: Home / Self Care | Payer: Medicare Other | Source: Ambulatory Visit | Attending: Radiation Oncology | Admitting: Radiation Oncology

## 2022-02-02 ENCOUNTER — Other Ambulatory Visit: Payer: Self-pay

## 2022-02-02 DIAGNOSIS — Z510 Encounter for antineoplastic radiation therapy: Secondary | ICD-10-CM | POA: Diagnosis not present

## 2022-02-02 LAB — RAD ONC ARIA SESSION SUMMARY
Course Elapsed Days: 6
Plan Fractions Treated to Date: 3
Plan Prescribed Dose Per Fraction: 12 Gy
Plan Total Fractions Prescribed: 5
Plan Total Prescribed Dose: 60 Gy
Reference Point Dosage Given to Date: 36 Gy
Reference Point Session Dosage Given: 12 Gy
Session Number: 3

## 2022-02-07 ENCOUNTER — Other Ambulatory Visit: Payer: Self-pay

## 2022-02-07 ENCOUNTER — Ambulatory Visit
Admission: RE | Admit: 2022-02-07 | Discharge: 2022-02-07 | Disposition: A | Discharge status: Home / Self Care | Payer: Medicare Other | Source: Ambulatory Visit | Attending: Radiation Oncology | Admitting: Radiation Oncology

## 2022-02-07 DIAGNOSIS — C3411 Malignant neoplasm of upper lobe, right bronchus or lung: Secondary | ICD-10-CM | POA: Insufficient documentation

## 2022-02-07 DIAGNOSIS — Z510 Encounter for antineoplastic radiation therapy: Secondary | ICD-10-CM | POA: Diagnosis not present

## 2022-02-07 LAB — RAD ONC ARIA SESSION SUMMARY
Course Elapsed Days: 11
Plan Fractions Treated to Date: 4
Plan Prescribed Dose Per Fraction: 12 Gy
Plan Total Fractions Prescribed: 5
Plan Total Prescribed Dose: 60 Gy
Reference Point Dosage Given to Date: 48 Gy
Reference Point Session Dosage Given: 12 Gy
Session Number: 4

## 2022-02-09 ENCOUNTER — Other Ambulatory Visit: Payer: Self-pay

## 2022-02-09 ENCOUNTER — Ambulatory Visit
Admission: RE | Admit: 2022-02-09 | Discharge: 2022-02-09 | Disposition: A | Discharge status: Home / Self Care | Payer: Medicare Other | Source: Ambulatory Visit | Attending: Radiation Oncology | Admitting: Radiation Oncology

## 2022-02-09 DIAGNOSIS — Z510 Encounter for antineoplastic radiation therapy: Secondary | ICD-10-CM | POA: Diagnosis not present

## 2022-02-09 LAB — RAD ONC ARIA SESSION SUMMARY
Course Elapsed Days: 13
Plan Fractions Treated to Date: 5
Plan Prescribed Dose Per Fraction: 12 Gy
Plan Total Fractions Prescribed: 5
Plan Total Prescribed Dose: 60 Gy
Reference Point Dosage Given to Date: 60 Gy
Reference Point Session Dosage Given: 12 Gy
Session Number: 5

## 2022-03-21 ENCOUNTER — Other Ambulatory Visit: Payer: Self-pay | Admitting: *Deleted

## 2022-03-21 ENCOUNTER — Ambulatory Visit
Admission: RE | Admit: 2022-03-21 | Discharge: 2022-03-21 | Disposition: A | Discharge status: Home / Self Care | Payer: Medicare Other | Source: Ambulatory Visit | Attending: Radiation Oncology | Admitting: Radiation Oncology

## 2022-03-21 VITALS — BP 127/85 | HR 83 | Temp 98.0°F | Resp 20 | Ht 73.0 in | Wt 215.4 lb

## 2022-03-21 DIAGNOSIS — R918 Other nonspecific abnormal finding of lung field: Secondary | ICD-10-CM

## 2022-03-21 DIAGNOSIS — C3411 Malignant neoplasm of upper lobe, right bronchus or lung: Secondary | ICD-10-CM

## 2022-03-21 NOTE — Progress Notes (Signed)
Radiation Oncology Follow up Note  Name: [name]   Date:   03/21/2022 MRN:  [id number] DOB: 1960-08-18    This 61 y.o. male presents to the clinic today for 1 month follow-up status post SBRT to his right upper lobe for a non-small cell lung cancer stage I.  REFERRING PROVIDER: Donnie Coffin, MD  HPI: Patient is a 61 year old male now at 1 month having completed SBRT to his right upper lobe for stage I non-small cell lung cancer.  Seen today in routine follow-up he is doing well specifically [place] any change in his pulmonary status productive cough or hemoptysis..  COMPLICATIONS OF TREATMENT: none  FOLLOW UP COMPLIANCE: keeps appointments   PHYSICAL EXAM:  BP 127/85   Pulse 83   Temp 98 F (36.7 C)   Resp 20   Ht 6\' 1"  (1.854 m)   Wt 215 lb 6.4 oz (97.7 kg)   BMI 28.42 kg/m  Well-developed well-nourished patient in NAD. HEENT reveals PERLA, EOMI, discs not visualized.  Oral cavity is clear. No oral mucosal lesions are identified. Neck is clear without evidence of cervical or supraclavicular adenopathy. Lungs are clear to A&P. Cardiac examination is essentially unremarkable with regular rate and rhythm without murmur rub or thrill. Abdomen is benign with no organomegaly or masses noted. Motor sensory and DTR levels are equal and symmetric in the upper and lower extremities. Cranial nerves II through XII are grossly intact. Proprioception is intact. No peripheral adenopathy or edema is identified. No motor or sensory levels are noted. Crude visual fields are within normal range.  RADIOLOGY RESULTS: CT scan of the chest ordered in 3 months  PLAN: Does not time patient is doing well very low side effect profile from his SBRT.  And pleased with his overall progress I have asked to see him back in 3 months with a CT scan of his chest prior to that visit.  Patient knows to call with any concerns.  I would like to take this opportunity to thank you for allowing me to participate in the  care of your patient.Noreene Filbert, MD

## 2022-04-07 ENCOUNTER — Encounter: Payer: Self-pay | Admitting: Podiatry

## 2022-04-07 ENCOUNTER — Ambulatory Visit (INDEPENDENT_AMBULATORY_CARE_PROVIDER_SITE_OTHER): Payer: Medicare Other | Admitting: Podiatry

## 2022-04-07 DIAGNOSIS — D689 Coagulation defect, unspecified: Secondary | ICD-10-CM

## 2022-04-07 DIAGNOSIS — M79675 Pain in left toe(s): Secondary | ICD-10-CM | POA: Diagnosis not present

## 2022-04-07 DIAGNOSIS — M79674 Pain in right toe(s): Secondary | ICD-10-CM

## 2022-04-07 DIAGNOSIS — B351 Tinea unguium: Secondary | ICD-10-CM

## 2022-04-07 NOTE — Progress Notes (Signed)
This patient returns to my office for at risk foot care.  This patient requires this care by a professional since this patient will be at risk due to having coagulation defect due to plavix.  This patient is unable to cut nails himself since the patient cannot reach his nails.These nails are painful walking and wearing shoes.  This patient presents for at risk foot care today.  General Appearance  Alert, conversant and in no acute stress.  Vascular  Dorsalis pedis and posterior tibial  pulses are palpable  bilaterally.  Capillary return is within normal limits  bilaterally. Temperature is within normal limits  bilaterally.  Neurologic  Senn-Weinstein monofilament wire test within normal limits  bilaterally. Muscle power within normal limits bilaterally.  Nails Thick disfigured discolored nails with subungual debris  from hallux to fifth toes bilaterally. No evidence of bacterial infection or drainage bilaterally.  Orthopedic  No limitations of motion  feet .  No crepitus or effusions noted.  No bony pathology or digital deformities noted.  Skin  normotropic skin with no porokeratosis noted bilaterally.  No signs of infections or ulcers noted.     Onychomycosis  Pain in right toes  Pain in left toes  Consent was obtained for treatment procedures.   Mechanical debridement of nails 1-5  bilaterally performed with a nail nipper.  Filed with dremel without incident.     Return office visit    3 months                 Told patient to return for periodic foot care and evaluation due to potential at risk complications.   Gardiner Barefoot DPM

## 2022-06-20 ENCOUNTER — Ambulatory Visit
Admission: RE | Admit: 2022-06-20 | Discharge: 2022-06-20 | Disposition: A | Discharge status: Home / Self Care | Payer: Medicare Other | Source: Ambulatory Visit | Attending: Radiation Oncology | Admitting: Radiation Oncology

## 2022-06-20 DIAGNOSIS — R918 Other nonspecific abnormal finding of lung field: Secondary | ICD-10-CM | POA: Diagnosis present

## 2022-06-20 LAB — POCT I-STAT CREATININE: Creatinine, Ser: 0.6 mg/dL — ABNORMAL LOW (ref 0.61–1.24)

## 2022-06-20 MED ORDER — IOHEXOL 300 MG/ML  SOLN
75.0000 mL | Freq: Once | INTRAMUSCULAR | Status: AC | PRN
  Administered 2022-06-20: 75 mL via INTRAVENOUS

## 2022-06-27 ENCOUNTER — Encounter: Payer: Self-pay | Admitting: Radiation Oncology

## 2022-06-27 ENCOUNTER — Ambulatory Visit
Admission: RE | Admit: 2022-06-27 | Discharge: 2022-06-27 | Disposition: A | Discharge status: Home / Self Care | Payer: Medicare Other | Source: Ambulatory Visit | Attending: Radiation Oncology | Admitting: Radiation Oncology

## 2022-06-27 VITALS — BP 152/93 | HR 97 | Temp 98.0°F | Resp 20 | Ht 73.0 in | Wt 224.0 lb

## 2022-06-27 DIAGNOSIS — C3411 Malignant neoplasm of upper lobe, right bronchus or lung: Secondary | ICD-10-CM | POA: Diagnosis present

## 2022-06-27 DIAGNOSIS — Z923 Personal history of irradiation: Secondary | ICD-10-CM | POA: Diagnosis not present

## 2022-06-27 NOTE — Progress Notes (Signed)
Radiation Oncology Follow up Note  Name: [name]   Date:   06/27/2022 MRN:  [id number] DOB: 1961/01/27    This 61 y.o. male presents to the clinic today for 52-month follow-up status post SBRT to his right upper lobe for stage I non-small cell lung cancer carcinoma.  REFERRING PROVIDER: Donnie Coffin, MD  HPI: Patient is a 61 year old male now at 4 months having completed SBRT to his right upper lobe for stage I non-small cell lung cancer.  Seen today in routine follow-up he is doing well.  Specifically denies dysphagia cough hemoptysis or any change in his pulmonary status..  He had a recent CT scan which I have reviewed showed a new spiculated 6 mm nodule in the left upper lobe not seen on previous imaging.  He has posttreatment changes slight decrease size of the treated lesion in the right upper lobe and stable mediastinal nodes.  COMPLICATIONS OF TREATMENT: none  FOLLOW UP COMPLIANCE: keeps appointments   PHYSICAL EXAM:  BP (!) 152/93   Pulse 97   Temp 98 F (36.7 C)   Resp 20   Ht 6\' 1"  (1.854 m)   Wt 224 lb (101.6 kg)   BMI 29.55 kg/m  Well-developed well-nourished patient in NAD. HEENT reveals PERLA, EOMI, discs not visualized.  Oral cavity is clear. No oral mucosal lesions are identified. Neck is clear without evidence of cervical or supraclavicular adenopathy. Lungs are clear to A&P. Cardiac examination is essentially unremarkable with regular rate and rhythm without murmur rub or thrill. Abdomen is benign with no organomegaly or masses noted. Motor sensory and DTR levels are equal and symmetric in the upper and lower extremities. Cranial nerves II through XII are grossly intact. Proprioception is intact. No peripheral adenopathy or edema is identified. No motor or sensory levels are noted. Crude visual fields are within normal range.  RADIOLOGY RESULTS: Serial CT scans reviewed compatible with above-stated findings  PLAN: At this time patient is doing well very low side  effect profile.  Based on radiology recommendations I will see him back in 3 to 4 months with a repeat CT scan of his chest.  Patient comprehends my recommendations well.  I would like to take this opportunity to thank you for allowing me to participate in the care of your patient.Noreene Filbert, MD

## 2022-07-14 ENCOUNTER — Encounter: Payer: Self-pay | Admitting: Podiatry

## 2022-07-14 ENCOUNTER — Ambulatory Visit (INDEPENDENT_AMBULATORY_CARE_PROVIDER_SITE_OTHER): Payer: Medicare Other | Admitting: Podiatry

## 2022-07-14 VITALS — BP 131/91 | HR 113

## 2022-07-14 DIAGNOSIS — B351 Tinea unguium: Secondary | ICD-10-CM

## 2022-07-14 DIAGNOSIS — D689 Coagulation defect, unspecified: Secondary | ICD-10-CM

## 2022-07-14 DIAGNOSIS — M79674 Pain in right toe(s): Secondary | ICD-10-CM | POA: Diagnosis not present

## 2022-07-14 DIAGNOSIS — M79675 Pain in left toe(s): Secondary | ICD-10-CM | POA: Diagnosis not present

## 2022-07-14 NOTE — Progress Notes (Signed)
This patient returns to my office for at risk foot care.  This patient requires this care by a professional since this patient will be at risk due to having coagulation defect due to plavix.  This patient is unable to cut nails himself since the patient cannot reach his nails.These nails are painful walking and wearing shoes.  This patient presents for at risk foot care today.  General Appearance  Alert, conversant and in no acute stress.  Vascular  Dorsalis pedis and posterior tibial  pulses are palpable  bilaterally.  Capillary return is within normal limits  bilaterally. Temperature is within normal limits  bilaterally.  Neurologic  Senn-Weinstein monofilament wire test within normal limits  bilaterally. Muscle power within normal limits bilaterally.  Nails Thick disfigured discolored nails with subungual debris  from hallux to fifth toes bilaterally. No evidence of bacterial infection or drainage bilaterally.  Orthopedic  No limitations of motion  feet .  No crepitus or effusions noted.  No bony pathology or digital deformities noted.  Skin  normotropic skin with no porokeratosis noted bilaterally.  No signs of infections or ulcers noted.     Onychomycosis  Pain in right toes  Pain in left toes  Consent was obtained for treatment procedures.   Mechanical debridement of nails 1-5  bilaterally performed with a nail nipper.  Filed with dremel without incident.     Return office visit    3 months                 Told patient to return for periodic foot care and evaluation due to potential at risk complications.   Gardiner Barefoot DPM

## 2022-10-27 ENCOUNTER — Ambulatory Visit (INDEPENDENT_AMBULATORY_CARE_PROVIDER_SITE_OTHER): Payer: Medicare Other | Admitting: Podiatry

## 2022-10-27 ENCOUNTER — Encounter: Payer: Self-pay | Admitting: Podiatry

## 2022-10-27 VITALS — BP 141/85 | HR 93

## 2022-10-27 DIAGNOSIS — D689 Coagulation defect, unspecified: Secondary | ICD-10-CM | POA: Diagnosis not present

## 2022-10-27 DIAGNOSIS — M79674 Pain in right toe(s): Secondary | ICD-10-CM | POA: Diagnosis not present

## 2022-10-27 DIAGNOSIS — B351 Tinea unguium: Secondary | ICD-10-CM | POA: Diagnosis not present

## 2022-10-27 DIAGNOSIS — M79675 Pain in left toe(s): Secondary | ICD-10-CM

## 2022-10-27 NOTE — Progress Notes (Signed)
This patient returns to my office for at risk foot care.  This patient requires this care by a professional since this patient will be at risk due to having coagulation defect due to plavix.  This patient is unable to cut nails himself since the patient cannot reach his nails.These nails are painful walking and wearing shoes.  This patient presents for at risk foot care today. ° °General Appearance  Alert, conversant and in no acute stress. ° °Vascular  Dorsalis pedis and posterior tibial  pulses are palpable  bilaterally.  Capillary return is within normal limits  bilaterally. Temperature is within normal limits  bilaterally. ° °Neurologic  Senn-Weinstein monofilament wire test within normal limits  bilaterally. Muscle power within normal limits bilaterally. ° °Nails Thick disfigured discolored nails with subungual debris  from hallux to fifth toes bilaterally. No evidence of bacterial infection or drainage bilaterally. ° °Orthopedic  No limitations of motion  feet .  No crepitus or effusions noted.  No bony pathology or digital deformities noted. ° °Skin  normotropic skin with no porokeratosis noted bilaterally.  No signs of infections or ulcers noted.    ° °Onychomycosis  Pain in right toes  Pain in left toes ° °Consent was obtained for treatment procedures.   Mechanical debridement of nails 1-5  bilaterally performed with a nail nipper.  Filed with dremel without incident.   ° ° °Return office visit    3 months                 Told patient to return for periodic foot care and evaluation due to potential at risk complications. ° ° °Mckinzi Eriksen DPM  °

## 2022-11-23 ENCOUNTER — Ambulatory Visit: Payer: Medicare Other | Admitting: Radiation Oncology

## 2022-11-23 DIAGNOSIS — Z923 Personal history of irradiation: Secondary | ICD-10-CM | POA: Insufficient documentation

## 2022-11-23 DIAGNOSIS — C3411 Malignant neoplasm of upper lobe, right bronchus or lung: Secondary | ICD-10-CM | POA: Insufficient documentation

## 2022-12-05 ENCOUNTER — Ambulatory Visit
Admission: RE | Admit: 2022-12-05 | Discharge: 2022-12-05 | Disposition: A | Discharge status: Home / Self Care | Payer: Medicare Other | Source: Ambulatory Visit | Attending: Radiation Oncology | Admitting: Radiation Oncology

## 2022-12-05 ENCOUNTER — Encounter: Payer: Self-pay | Admitting: Radiation Oncology

## 2022-12-05 VITALS — BP 132/96 | HR 91 | Temp 96.0°F | Resp 18 | Ht 73.0 in | Wt 224.7 lb

## 2022-12-05 DIAGNOSIS — C3411 Malignant neoplasm of upper lobe, right bronchus or lung: Secondary | ICD-10-CM

## 2022-12-05 DIAGNOSIS — Z923 Personal history of irradiation: Secondary | ICD-10-CM | POA: Diagnosis not present

## 2022-12-05 NOTE — Progress Notes (Signed)
Radiation Oncology Follow up Note  Name: [name]   Date:   12/05/2022 MRN:  [id number] DOB: [birth year]    This 62 y.o. male presents to the clinic today for follow-up of spiculated nodule in the left upper lobe and patient previously treated for stage I non-small cell lung cancer to his right upper lobe.  REFERRING PROVIDER: Emogene Morgan, MD  HPI: Patient is a 62 year old male out close to year having completed SBRT treatment to his right upper lobe for stage I non-small cell lung cancer we been tracking a new 6 mm spiculated nodule in the left upper lobe measuring.  Patient is doing well has not yet had his follow-up CT scan specifically denies cough hemoptysis or any change in his pulmonary status.  COMPLICATIONS OF TREATMENT: none  FOLLOW UP COMPLIANCE: keeps appointments   PHYSICAL EXAM:  BP (!) 132/96   Pulse 91   Temp (!) 96 F (35.6 C)   Resp 18   Ht 6\' 1"  (1.854 m)   Wt 224 lb 11.2 oz (101.9 kg)   BMI 29.65 kg/m  Well-developed well-nourished patient in NAD. HEENT reveals PERLA, EOMI, discs not visualized.  Oral cavity is clear. No oral mucosal lesions are identified. Neck is clear without evidence of cervical or supraclavicular adenopathy. Lungs are clear to A&P. Cardiac examination is essentially unremarkable with regular rate and rhythm without murmur rub or thrill. Abdomen is benign with no organomegaly or masses noted. Motor sensory and DTR levels are equal and symmetric in the upper and lower extremities. Cranial nerves II through XII are grossly intact. Proprioception is intact. No peripheral adenopathy or edema is identified. No motor or sensory levels are noted. Crude visual fields are within normal range.  RADIOLOGY RESULTS: Previous scan reviewed  PLAN: At this time he is scheduled for mid May to have a repeat CT scan I will see him shortly thereafter should this lesion be progressing we will follow through for SBRT of this lesion.  Patient comprehend my  recommendations well.  I would like to take this opportunity to thank you for allowing me to participate in the care of your patient.Carmina Miller, MD

## 2022-12-26 ENCOUNTER — Ambulatory Visit
Admission: RE | Admit: 2022-12-26 | Discharge: 2022-12-26 | Disposition: A | Discharge status: Home / Self Care | Payer: Medicare Other | Source: Ambulatory Visit | Attending: Radiation Oncology | Admitting: Radiation Oncology

## 2022-12-26 DIAGNOSIS — C3411 Malignant neoplasm of upper lobe, right bronchus or lung: Secondary | ICD-10-CM

## 2022-12-26 MED ORDER — IOHEXOL 300 MG/ML  SOLN
75.0000 mL | Freq: Once | INTRAMUSCULAR | Status: AC | PRN
  Administered 2022-12-26: 75 mL via INTRAVENOUS

## 2022-12-29 ENCOUNTER — Encounter: Payer: Self-pay | Admitting: Radiation Oncology

## 2022-12-29 ENCOUNTER — Ambulatory Visit
Admission: RE | Admit: 2022-12-29 | Discharge: 2022-12-29 | Disposition: A | Discharge status: Home / Self Care | Payer: Medicare Other | Source: Ambulatory Visit | Attending: Radiation Oncology | Admitting: Radiation Oncology

## 2022-12-29 VITALS — BP 115/85 | HR 82 | Temp 97.6°F | Resp 19 | Wt 224.4 lb

## 2022-12-29 DIAGNOSIS — C3411 Malignant neoplasm of upper lobe, right bronchus or lung: Secondary | ICD-10-CM | POA: Diagnosis present

## 2022-12-29 DIAGNOSIS — Z923 Personal history of irradiation: Secondary | ICD-10-CM | POA: Diagnosis not present

## 2022-12-29 NOTE — Progress Notes (Signed)
Radiation Oncology Follow up Note  Name: [name]   Date:   12/29/2022 MRN:  [id number] DOB: [birth year]    This 62 y.o. male presents to the clinic today for 39-month follow-up status post SBRT to his l right upper lobe for stage I non-small cell lung cancer.  REFERRING PROVIDER: Emogene Morgan, MD  HPI: Patient is a 62 year old male now out 10 months having completed SBRT to his right upper lobe for his stage I non-small cell lung cancer.  Seen today in routine follow-up clinically is doing well specifically denies any change in pulmonary status cough hemoptysis or chest tightness..  Recent CT scan of his chest shows stable changes in the right upper lobe although there is interval enlargement of left upper lobe pulmonary nodule consistent with metachronous adenocarcinoma.  COMPLICATIONS OF TREATMENT: none  FOLLOW UP COMPLIANCE: keeps appointments   PHYSICAL EXAM:  BP 115/85   Pulse 82   Temp 97.6 F (36.4 C)   Resp 19   Wt 224 lb 6.4 oz (101.8 kg)   SpO2 98%   BMI 29.61 kg/m  Well-developed well-nourished patient in NAD. HEENT reveals PERLA, EOMI, discs not visualized.  Oral cavity is clear. No oral mucosal lesions are identified. Neck is clear without evidence of cervical or supraclavicular adenopathy. Lungs are clear to A&P. Cardiac examination is essentially unremarkable with regular rate and rhythm without murmur rub or thrill. Abdomen is benign with no organomegaly or masses noted. Motor sensory and DTR levels are equal and symmetric in the upper and lower extremities. Cranial nerves II through XII are grossly intact. Proprioception is intact. No peripheral adenopathy or edema is identified. No motor or sensory levels are noted. Crude visual fields are within normal range.  RADIOLOGY RESULTS: CT scan reviewed PET CT scan ordered  PLAN: Present time patient is clinically doing well.  I have ordered a PET CT scan for better clarification of the left upper lobe pulmonary nodule.   Should that be hypermetabolic we will offer SBRT treatment.  Patient comprehends her recommendations well.  Will see him in follow-up after his PET CT scan.  I would like to take this opportunity to thank you for allowing me to participate in the care of your patient.Carmina Miller, MD

## 2023-01-18 ENCOUNTER — Encounter: Admission: RE | Admit: 2023-01-18 | Payer: Medicare Other | Source: Ambulatory Visit

## 2023-01-18 ENCOUNTER — Telehealth: Payer: Self-pay | Admitting: *Deleted

## 2023-01-18 NOTE — Telephone Encounter (Signed)
Spoke with Stark Klein, Staff member at the [facility], regarding  the new schedule for the PET Scan and Follow up with Dr. Rushie Chestnut.   Per staff, the dates were put on the calendar and he will be able to make both appointments.  PET Scan  6/20 @ 1130 and Dr. Rushie Chestnut  FU  6/25 at 930am.

## 2023-01-26 ENCOUNTER — Encounter: Payer: Self-pay | Admitting: Podiatry

## 2023-01-26 ENCOUNTER — Ambulatory Visit (INDEPENDENT_AMBULATORY_CARE_PROVIDER_SITE_OTHER): Payer: Medicare Other | Admitting: Podiatry

## 2023-01-26 ENCOUNTER — Ambulatory Visit
Admission: RE | Admit: 2023-01-26 | Discharge: 2023-01-26 | Disposition: A | Discharge status: Home / Self Care | Payer: Medicare Other | Source: Ambulatory Visit | Attending: Radiation Oncology | Admitting: Radiation Oncology

## 2023-01-26 VITALS — BP 142/89

## 2023-01-26 DIAGNOSIS — D3501 Benign neoplasm of right adrenal gland: Secondary | ICD-10-CM | POA: Insufficient documentation

## 2023-01-26 DIAGNOSIS — B353 Tinea pedis: Secondary | ICD-10-CM | POA: Diagnosis not present

## 2023-01-26 DIAGNOSIS — D689 Coagulation defect, unspecified: Secondary | ICD-10-CM | POA: Diagnosis not present

## 2023-01-26 DIAGNOSIS — I2510 Atherosclerotic heart disease of native coronary artery without angina pectoris: Secondary | ICD-10-CM | POA: Diagnosis not present

## 2023-01-26 DIAGNOSIS — K8020 Calculus of gallbladder without cholecystitis without obstruction: Secondary | ICD-10-CM | POA: Diagnosis not present

## 2023-01-26 DIAGNOSIS — I700 Atherosclerosis of aorta: Secondary | ICD-10-CM | POA: Insufficient documentation

## 2023-01-26 DIAGNOSIS — C3411 Malignant neoplasm of upper lobe, right bronchus or lung: Secondary | ICD-10-CM

## 2023-01-26 DIAGNOSIS — D1779 Benign lipomatous neoplasm of other sites: Secondary | ICD-10-CM | POA: Diagnosis not present

## 2023-01-26 DIAGNOSIS — B351 Tinea unguium: Secondary | ICD-10-CM | POA: Diagnosis not present

## 2023-01-26 DIAGNOSIS — M79675 Pain in left toe(s): Secondary | ICD-10-CM | POA: Diagnosis not present

## 2023-01-26 DIAGNOSIS — M79674 Pain in right toe(s): Secondary | ICD-10-CM | POA: Diagnosis not present

## 2023-01-26 DIAGNOSIS — C3412 Malignant neoplasm of upper lobe, left bronchus or lung: Secondary | ICD-10-CM | POA: Diagnosis present

## 2023-01-26 LAB — GLUCOSE, CAPILLARY: Glucose-Capillary: 106 mg/dL — ABNORMAL HIGH (ref 70–99)

## 2023-01-26 MED ORDER — KETOCONAZOLE 2 % EX CREA: TOPICAL_CREAM | CUTANEOUS | 1 refills | Status: DC

## 2023-01-26 MED ORDER — FLUDEOXYGLUCOSE F - 18 (FDG) INJECTION
12.2000 | Freq: Once | INTRAVENOUS | Status: AC | PRN
  Administered 2023-01-26: 12.2 via INTRAVENOUS

## 2023-01-26 NOTE — Progress Notes (Signed)
  Subjective:  Patient ID: [name], male    DOB: 1960-11-07,  MRN: [id number]  [name] presents to clinic today for: at risk foot care with h/o coagulation defect and painful elongated mycotic toenails 1-5 bilaterally which are tender when wearing enclosed shoe gear. Pain is relieved with periodic professional debridement.  Chief Complaint  Patient presents with   Nail Problem    RFC,PCP:   Aycock, Ngwe A, MD,lov:12/23,      PCP is Aycock, Ngwe A, MD.  No Known Allergies  Review of Systems: Negative except as noted in the HPI.  Objective: No changes noted in today's physical examination. Vitals:   01/26/23 0924  BP: (!) 142/89    [name] is a pleasant 62 y.o. male in NAD. AAO x 3.  Vascular Examination: Capillary refill time <3 seconds b/l LE. Palpable pedal pulses b/l LE. Digital hair absent b/l. No pedal edema b/l. Skin temperature gradient WNL b/l. No varicosities b/l. No ischemia or gangrene noted b/l LE. No cyanosis or clubbing noted b/l LE.Marland Kitchen  Dermatological Examination: Pedal skin with normal turgor, texture and tone b/l. No open wounds. No interdigital macerations b/l. Toenails 1-5 b/l thickened, discolored, dystrophic with subungual debris. There is pain on palpation to dorsal aspect of nailplates. Diffuse scaling noted peripherally and plantarly b/l feet.  No interdigital macerations.  No blisters, no weeping. No signs of secondary bacterial infection noted..  Neurological Examination: Protective sensation intact with 10 gram monofilament b/l LE. Vibratory sensation intact b/l LE.   Musculoskeletal Examination: Muscle strength 5/5 to all lower extremity muscle groups bilaterally. No pain, crepitus or joint limitation noted with ROM bilateral LE.  Assessment/Plan: 1. Pain due to onychomycosis of toenails of both feet   2. Tinea pedis of both feet   3. Coagulation disorder (HCC)    Meds ordered this encounter  Medications   ketoconazole (NIZORAL) 2 %  cream    Sig: Apply to both feet and between toes once daily for 6 weeks.    Dispense:  60 g    Refill:  1    Patient was evaluated and treated. All patient's and/or POA's questions/concerns addressed on today's visit. Toenails 1-5 debrided in length and girth without incident. Continue soft, supportive shoe gear daily. Report any pedal injuries to medical professional. Call office if there are any questions/concerns. -Patient to continue soft, supportive shoe gear daily. -Mycotic toenails 1-5 bilaterally were debrided in length and girth with sterile nail nippers and dremel without incident. -Discussed tinea pedis infection. To prevent re-infection of tinea pedis, patient/POA/caregiver instructed to spray shoes with Lysol every evening and clean tub/shower with bleach based cleanser. -For tinea pedis, Rx sent to pharmacy for Ketoconazole Cream 2% to be applied once daily for six weeks. -Patient/POA to call should there be question/concern in the interim.   Return in about 3 months (around 04/28/2023).  Freddie Breech, DPM

## 2023-01-26 NOTE — Patient Instructions (Signed)
 To prevent reinfection, spray shoes with lysol every evening.  Clean tub or shower with bleach based cleanser.  Athlete's Foot Athlete's foot (tinea pedis) is a fungal infection of the skin on your feet. It often occurs on the skin that is between or underneath the toes. It can also occur on the soles of your feet. The infection can spread from person to person (is contagious). It can also spread when a person's bare feet come in contact with the fungus on shower floors or on items such as shoes. What are the causes? This condition is caused by a fungus that grows in warm, moist places. You can get athlete's foot by sharing shoes, shower stalls, towels, and wet floors with someone who is infected. Not washing your feet or changing your socks often enough can also lead to athlete's foot. What increases the risk? This condition is more likely to develop in: Men. People who have a weak body defense system (immune system). People who have diabetes. People who use public showers, such as at a gym. People who wear heavy-duty shoes, such as industrial or military shoes. Seasons with warm, humid weather. What are the signs or symptoms? Symptoms of this condition include: Itchy areas between your toes or on the soles of your feet. White, flaky, or scaly areas between your toes or on the soles of your feet. Very itchy small blisters between your toes or on the soles of your feet. Small cuts in your skin. These cuts can become infected. Thick or discolored toenails. How is this diagnosed? This condition may be diagnosed with a physical exam and a review of your medical history. Your health care provider may also take a skin or toenail sample to examine under a microscope. How is this treated? This condition is treated with antifungal medicines. These may be applied as powders, ointments, or creams. In severe cases, an oral antifungal medicine may be given. Follow these instructions at  home: Medicines Apply or take over-the-counter and prescription medicines only as told by your health care provider. Apply your antifungal medicine as told by your health care provider. Do not stop using the antifungal even if your condition improves. Foot care Do not scratch your feet. Keep your feet dry: Wear cotton or wool socks. Change your socks every day or if they become wet. Wear shoes that allow air to flow, such as sandals or canvas tennis shoes. Wash and dry your feet, including the area between your toes. Also, wash and dry your feet: Every day or as told by your health care provider. After exercising. General instructions Do not let others use towels, shoes, nail clippers, or other personal items that touch your feet. Protect your feet by wearing sandals in wet areas, such as locker rooms and shared showers. Keep all follow-up visits. This is important. If you have diabetes, keep your blood sugar under control. Contact a health care provider if: You have a fever. You have swelling, soreness, warmth, or redness in your foot. Your feet are not getting better with treatment. Your symptoms get worse. You have new symptoms. You have severe pain. Summary Athlete's foot (tinea pedis) is a fungal infection of the skin on your feet. It often occurs on skin that is between or underneath the toes. This condition is caused by a fungus that grows in warm, moist places. Symptoms include white, flaky, or scaly areas between your toes or on the soles of your feet. This condition is treated with antifungal medicines.   Keep your feet clean. Always dry them thoroughly. This information is not intended to replace advice given to you by your health care provider. Make sure you discuss any questions you have with your health care provider. Document Revised: 11/15/2020 Document Reviewed: 11/15/2020 Elsevier Patient Education  2024 Elsevier Inc.  

## 2023-01-31 ENCOUNTER — Other Ambulatory Visit: Payer: Self-pay | Admitting: *Deleted

## 2023-01-31 ENCOUNTER — Ambulatory Visit
Admission: RE | Admit: 2023-01-31 | Discharge: 2023-01-31 | Disposition: A | Discharge status: Home / Self Care | Payer: Medicare Other | Source: Ambulatory Visit | Attending: Radiation Oncology | Admitting: Radiation Oncology

## 2023-01-31 ENCOUNTER — Encounter: Payer: Self-pay | Admitting: Radiation Oncology

## 2023-01-31 VITALS — BP 141/91 | HR 101 | Temp 97.0°F | Resp 20 | Wt 221.0 lb

## 2023-01-31 DIAGNOSIS — C3411 Malignant neoplasm of upper lobe, right bronchus or lung: Secondary | ICD-10-CM

## 2023-01-31 DIAGNOSIS — C3412 Malignant neoplasm of upper lobe, left bronchus or lung: Secondary | ICD-10-CM | POA: Insufficient documentation

## 2023-01-31 NOTE — Progress Notes (Signed)
Radiation Oncology Follow up Note  Name: [name]   Date:   01/31/2023 MRN:  [id number] DOB: [birth year]    This 62 y.o. male presents to the clinic today for follow-up of PET/CT CT scan and patient with new left lung nodule patient previously treated with SBRT to his right upper lobe for stage I non-small cell lung cancer.  REFERRING PROVIDER: Emogene Morgan, MD  HPI: Patient is a 62 year old male now at 11 months having completed SBRT to his right upper lobe for stage I non-small cell lung cancer.  On recent follow-up CT scan he was noted to have interval enlargement of left upper lobe pulmonary nodule consistent with metachronous adenocarcinoma.  No evidence of disease in his right upper lobe was noted.  I ordered a PET CT scan showing hypermetabolic activity in the left upper lobe new nodule as well as hypermetabolic mediastinal lymph nodes consistent with locally advanced lung cancer.  Continues to be asymptomatic specifically denies cough hemoptysis or chest tightness. COMPLICATIONS OF TREATMENT: none  FOLLOW UP COMPLIANCE: keeps appointments   PHYSICAL EXAM:  BP (!) 141/91   Pulse (!) 101   Temp (!) 97 F (36.1 C) (Tympanic)   Resp 20   Wt 221 lb (100.2 kg)   BMI 29.16 kg/m  Well-developed well-nourished patient in NAD. HEENT reveals PERLA, EOMI, discs not visualized.  Oral cavity is clear. No oral mucosal lesions are identified. Neck is clear without evidence of cervical or supraclavicular adenopathy. Lungs are clear to A&P. Cardiac examination is essentially unremarkable with regular rate and rhythm without murmur rub or thrill. Abdomen is benign with no organomegaly or masses noted. Motor sensory and DTR levels are equal and symmetric in the upper and lower extremities. Cranial nerves II through XII are grossly intact. Proprioception is intact. No peripheral adenopathy or edema is identified. No motor or sensory levels are noted. Crude visual fields are within normal  range.  RADIOLOGY RESULTS: PET CT scan reviewed as well as CT scan compatible with above-stated findings  PLAN: At this time I am referring the patient back to [facility] pulmonology for possible bronchoscopy especially with evaluation of mediastinal hypermetabolic nodes.  Most referring the patient to medical oncology.  Should this be stage III disease would offer concurrent chemoradiation.  Patient seems to comprehend the recommendations well.  Appointments were made.  I would like to take this opportunity to thank you for allowing me to participate in the care of your patient.Carmina Miller, MD

## 2023-02-03 ENCOUNTER — Inpatient Hospital Stay: Payer: Medicare Other

## 2023-02-03 ENCOUNTER — Encounter: Payer: Self-pay | Admitting: Oncology

## 2023-02-03 ENCOUNTER — Inpatient Hospital Stay: Payer: Medicare Other | Attending: Oncology | Admitting: Oncology

## 2023-02-03 VITALS — BP 125/99 | HR 92 | Temp 95.6°F | Resp 18 | Ht 73.0 in | Wt 233.1 lb

## 2023-02-03 DIAGNOSIS — J439 Emphysema, unspecified: Secondary | ICD-10-CM | POA: Insufficient documentation

## 2023-02-03 DIAGNOSIS — F203 Undifferentiated schizophrenia: Secondary | ICD-10-CM | POA: Diagnosis not present

## 2023-02-03 DIAGNOSIS — D3502 Benign neoplasm of left adrenal gland: Secondary | ICD-10-CM | POA: Insufficient documentation

## 2023-02-03 DIAGNOSIS — I2721 Secondary pulmonary arterial hypertension: Secondary | ICD-10-CM | POA: Diagnosis not present

## 2023-02-03 DIAGNOSIS — R948 Abnormal results of function studies of other organs and systems: Secondary | ICD-10-CM

## 2023-02-03 DIAGNOSIS — N400 Enlarged prostate without lower urinary tract symptoms: Secondary | ICD-10-CM | POA: Diagnosis not present

## 2023-02-03 DIAGNOSIS — R918 Other nonspecific abnormal finding of lung field: Secondary | ICD-10-CM

## 2023-02-03 DIAGNOSIS — Z79899 Other long term (current) drug therapy: Secondary | ICD-10-CM | POA: Diagnosis not present

## 2023-02-03 DIAGNOSIS — Z720 Tobacco use: Secondary | ICD-10-CM | POA: Diagnosis not present

## 2023-02-03 DIAGNOSIS — I700 Atherosclerosis of aorta: Secondary | ICD-10-CM | POA: Insufficient documentation

## 2023-02-03 DIAGNOSIS — D3501 Benign neoplasm of right adrenal gland: Secondary | ICD-10-CM | POA: Insufficient documentation

## 2023-02-03 DIAGNOSIS — F209 Schizophrenia, unspecified: Secondary | ICD-10-CM | POA: Insufficient documentation

## 2023-02-03 DIAGNOSIS — F17210 Nicotine dependence, cigarettes, uncomplicated: Secondary | ICD-10-CM | POA: Diagnosis not present

## 2023-02-03 DIAGNOSIS — Z7902 Long term (current) use of antithrombotics/antiplatelets: Secondary | ICD-10-CM | POA: Insufficient documentation

## 2023-02-03 DIAGNOSIS — Z7982 Long term (current) use of aspirin: Secondary | ICD-10-CM | POA: Insufficient documentation

## 2023-02-03 DIAGNOSIS — K8020 Calculus of gallbladder without cholecystitis without obstruction: Secondary | ICD-10-CM | POA: Insufficient documentation

## 2023-02-03 LAB — CBC WITH DIFFERENTIAL/PLATELET
Abs Immature Granulocytes: 0.02 10*3/uL (ref 0.00–0.07)
Basophils Absolute: 0 10*3/uL (ref 0.0–0.1)
Basophils Relative: 1 %
Eosinophils Absolute: 0.2 10*3/uL (ref 0.0–0.5)
Eosinophils Relative: 2 %
HCT: 40.5 % (ref 39.0–52.0)
Hemoglobin: 13.8 g/dL (ref 13.0–17.0)
Immature Granulocytes: 0 %
Lymphocytes Relative: 36 %
Lymphs Abs: 2.9 10*3/uL (ref 0.7–4.0)
MCH: 29.9 pg (ref 26.0–34.0)
MCHC: 34.1 g/dL (ref 30.0–36.0)
MCV: 87.9 fL (ref 80.0–100.0)
Monocytes Absolute: 0.6 10*3/uL (ref 0.1–1.0)
Monocytes Relative: 8 %
Neutro Abs: 4.5 10*3/uL (ref 1.7–7.7)
Neutrophils Relative %: 53 %
Platelets: 219 10*3/uL (ref 150–400)
RBC: 4.61 MIL/uL (ref 4.22–5.81)
RDW: 13.2 % (ref 11.5–15.5)
WBC: 8.2 10*3/uL (ref 4.0–10.5)
nRBC: 0 % (ref 0.0–0.2)

## 2023-02-03 LAB — COMPREHENSIVE METABOLIC PANEL
ALT: 20 U/L (ref 0–44)
AST: 21 U/L (ref 15–41)
Albumin: 4.2 g/dL (ref 3.5–5.0)
Alkaline Phosphatase: 83 U/L (ref 38–126)
Anion gap: 11 (ref 5–15)
BUN: 7 mg/dL — ABNORMAL LOW (ref 8–23)
CO2: 23 mmol/L (ref 22–32)
Calcium: 9.8 mg/dL (ref 8.9–10.3)
Chloride: 103 mmol/L (ref 98–111)
Creatinine, Ser: 0.53 mg/dL — ABNORMAL LOW (ref 0.61–1.24)
GFR, Estimated: >60 mL/min (ref >60)
Glucose, Bld: 107 mg/dL — ABNORMAL HIGH (ref 70–99)
Potassium: 3.6 mmol/L (ref 3.5–5.1)
Sodium: 137 mmol/L (ref 135–145)
Total Bilirubin: 0.4 mg/dL (ref 0.3–1.2)
Total Protein: 7.4 g/dL (ref 6.5–8.1)

## 2023-02-03 LAB — PSA: Prostatic Specific Antigen: 0.73 ng/mL (ref 0.00–4.00)

## 2023-02-03 LAB — LACTATE DEHYDROGENASE: LDH: 108 U/L (ref 98–192)

## 2023-02-03 NOTE — Progress Notes (Signed)
No concerns for the provider today. 

## 2023-02-04 DIAGNOSIS — Z720 Tobacco use: Secondary | ICD-10-CM | POA: Insufficient documentation

## 2023-02-04 NOTE — Assessment & Plan Note (Signed)
Patient lives in group home.  Continue follow-up with primary care provider. On Seroquel and mirtazapine

## 2023-02-04 NOTE — Progress Notes (Signed)
Hematology/Oncology Consult Note [phone] [phone]     REFERRING PROVIDER: Carmina Miller, MD    CHIEF COMPLAINTS/PURPOSE OF CONSULTATION:  Mass  ASSESSMENT & PLAN:   Lung mass CT scan and PET scan will reviewed and discussed with patient. Suspicious for recurrent lung cancer with lymph node involvement. Recommend MRI brain with and without contrast. Recommend patient to see pulmonology for biopsy via bronchoscopy.  Refer to Dr. Karna Christmas.  discussed the case with him via secure chat  If confirmed stage III non-small cell lung cancer, I will recommend concurrent chemotherapy with radiation.  Tobacco use Recommend smoke cessation.  Schizophrenia (HCC) Patient lives in group home.  Continue follow-up with primary care provider. On Seroquel and mirtazapine    Orders Placed This Encounter  Procedures   MR Brain W Wo Contrast    Standing Status:   Future    Standing Expiration Date:   02/03/2024    Order Specific Question:   If indicated for the ordered procedure, I authorize the administration of contrast media per Radiology protocol    Answer:   Yes    Order Specific Question:   What is the patient's sedation requirement?    Answer:   No Sedation    Order Specific Question:   Does the patient have a pacemaker or implanted devices?    Answer:   No    Order Specific Question:   Use SRS Protocol?    Answer:   No    Order Specific Question:   Preferred imaging location?    Answer:   [facility] (table limit - 550lbs)   Comprehensive metabolic panel    Standing Status:   Future    Number of Occurrences:   1    Standing Expiration Date:   02/03/2024   CBC with Differential/Platelet    Standing Status:   Future    Number of Occurrences:   1    Standing Expiration Date:   02/03/2024   Lactate dehydrogenase    Standing Status:   Future    Number of Occurrences:   1    Standing Expiration Date:   02/03/2024   PSA    Standing Status:   Future     Number of Occurrences:   1    Standing Expiration Date:   02/03/2024   Ambulatory referral to Pulmonology    Referral Priority:   Routine    Referral Type:   Consultation    Referral Reason:   Specialty Services Required    Referred to Provider:   Vida Rigger, MD    Requested Specialty:   Pulmonary Disease    Number of Visits Requested:   1   CC RN navigator to coordinate follow-up appointment after biopsy All questions were answered. The patient knows to call the clinic with any problems, questions or concerns.  [name], MD, PhD [facility] Hematology Oncology 02/03/2023    HISTORY OF PRESENTING ILLNESS:  [name] 62 y.o. male presents to establish care for lung mass I have reviewed his chart and materials related to his cancer extensively and collaborated history with the patient. Summary of oncologic history is as follows:  12/08/2021 CT chest with contrast showed irregular solid pulmonary nodule of right upper lobe, increased in size.  Bibasilar consolidation.  Mildly enlarged right lower paratracheal lymph node.  Atherosclerosis. 01/04/2022 PET scan showed 10 mm irregular nodule in the lateral right upper lobe, suspicious for primary bronchogenic neoplasm. 2 small right paratracheal nodes, indeterminate and  favored to be reactive.  Attention on follow-up.  Patient was seen by radiation oncology Dr. Rushie Chestnut.  Patient was offered SBRT to the right upper lobe nodule/presumed non-small cell lung cancer stage I.  Follow-up surveillance CT 12/29/2022, CT chest with contrast showed Stable radiation changes involving the right upper lobe with a small residual treated nodule.  Decrease in size.  Interval enlargement of the left upper lobe pulmonary nodule.  Stable mediastinal nodes stable advanced emphysematous changes and areas of pulmonary scarring.  Stable bilateral adrenal gland adenomas.  Emphysema.  01/30/2023 PET scan showed new and increasing hypermetabolic mediastinal  lymph nodes and hypermetabolic left upper lobe nodule.  No evidence of distant metastasis.  Possible mild prostate hypermetabolism. Liver is mildly heterogeneous, raising suspicious for steatosis.  Cholelithiasis.  Right adrenal adenoma.  Left adrenal myolipoma, aortic atherosclerosis.  Enlarged pulmonary trunk, indicated for pulmonary artery hypertension.  Patient lives in a group home.  He reports chronic cough.  Some shortness of breath with exertion. He is a current everyday smoker.  Few cigarettes per day.  He denies any unintentional weight loss, night sweats, fever or chills.  Patient is on aspirin and Plavix MEDICAL HISTORY:  Past Medical History:  Diagnosis Date   Acute on chronic respiratory failure with hypoxia (HCC)    Asthma    COPD (chronic obstructive pulmonary disease) (HCC)    Depression    History of hiatal hernia    Hypertension    Pneumonia    Schizophrenia (HCC)    Shortness of breath dyspnea    Stroke [facility])    Umbilical hernia     SURGICAL HISTORY: Past Surgical History:  Procedure Laterality Date   COLONOSCOPY WITH PROPOFOL N/A 09/21/2021   Procedure: COLONOSCOPY WITH PROPOFOL;  Surgeon: Regis Bill, MD;  Location: [facility] ENDOSCOPY;  Service: Endoscopy;  Laterality: N/A;   HERNIA REPAIR     INSERTION OF MESH N/A 12/18/2014   Procedure: INSERTION OF MESH;  Surgeon: Lattie Haw, MD;  Location: [facility] ORS;  Service: General;  Laterality: N/A;   SUPRA-UMBILICAL HERNIA  12/18/2014   Procedure: SUPRA-UMBILICAL HERNIA;  Surgeon: Lattie Haw, MD;  Location: [facility] ORS;  Service: General;;   UMBILICAL HERNIA REPAIR N/A 12/18/2014   Procedure: HERNIA REPAIR UMBILICAL ADULT;  Surgeon: Lattie Haw, MD;  Location: [facility] ORS;  Service: General;  Laterality: N/A;    SOCIAL HISTORY: Social History   Socioeconomic History   Marital status: Married    Spouse name: Not on file   Number of children: Not on file   Years of education: Not on file   Highest  education level: Not on file  Occupational History   Not on file  Tobacco Use   Smoking status: Every Day    Packs/day: 0.15    Years: 45.00    Additional pack years: 0.00    Total pack years: 6.75    Types: Cigarettes   Smokeless tobacco: Never  Vaping Use   Vaping Use: Never used  Substance and Sexual Activity   Alcohol use: Not Currently    Alcohol/week: 75.0 standard drinks of alcohol    Types: 75 Cans of beer per week   Drug use: Not Currently   Sexual activity: Not on file  Other Topics Concern   Not on file  Social History Narrative   Not on file   Social Determinants of Health   Financial Resource Strain: Not on file  Food Insecurity: No Food Insecurity (02/03/2023)   Hunger Vital  Sign    Worried About Programme researcher, broadcasting/film/video in the Last Year: Never true    Ran Out of Food in the Last Year: Never true  Transportation Needs: No Transportation Needs (02/03/2023)   PRAPARE - Administrator, Civil Service (Medical): No    Lack of Transportation (Non-Medical): No  Physical Activity: Not on file  Stress: Not on file  Social Connections: Not on file  Intimate Partner Violence: Not At Risk (02/03/2023)   Humiliation, Afraid, Rape, and Kick questionnaire    Fear of Current or Ex-Partner: No    Emotionally Abused: No    Physically Abused: No    Sexually Abused: No    FAMILY HISTORY: Family History  Problem Relation Age of Onset   Asthma Mother    Hypertension Father     ALLERGIES:  has No Known Allergies.  MEDICATIONS:  Current Outpatient Medications  Medication Sig Dispense Refill   acetaminophen (TYLENOL) 325 MG tablet Take 650 mg by mouth 2 (two) times daily as needed for mild pain.     amantadine (SYMMETREL) 100 MG capsule Take 100 mg by mouth 2 (two) times daily.     aspirin EC 81 MG tablet Take 81 mg by mouth daily. Swallow whole.     clopidogrel (PLAVIX) 75 MG tablet Take 75 mg by mouth daily.     dextromethorphan-guaiFENesin (ROBITUSSIN-DM)  10-100 MG/5ML liquid Take 10 mLs by mouth every 6 (six) hours as needed for cough.     diltiazem (CARDIZEM CD) 180 MG 24 hr capsule Take 180 mg by mouth daily.      docusate sodium (COLACE) 100 MG capsule Take 100 mg by mouth 2 (two) times daily as needed for mild constipation.     famotidine (PEPCID) 20 MG tablet Take 1 tablet (20 mg total) by mouth daily. 30 tablet 0   Fluticasone-Umeclidin-Vilant (TRELEGY ELLIPTA) 100-62.5-25 MCG/ACT AEPB Inhale 1 puff into the lungs daily. 28 each 1   gabapentin (NEURONTIN) 100 MG capsule Take 100 mg by mouth at bedtime.      guaiFENesin (MUCINEX) 600 MG 12 hr tablet Take 600 mg by mouth 2 (two) times daily.     hydrochlorothiazide (HYDRODIURIL) 12.5 MG tablet Take 12.5 mg by mouth daily.     INVEGA TRINZA 819 MG/2.625ML SUSY Inject 819 mg into the muscle every 3 (three) months.      ipratropium-albuterol (DUONEB) 0.5-2.5 (3) MG/3ML SOLN Take 3 mLs by nebulization every 6 (six) hours as needed (every 4 to 6 hours as needed for shortnes of breath or wheeziing).      ketoconazole (NIZORAL) 2 % cream Apply to both feet and between toes once daily for 6 weeks. 60 g 1   lovastatin (MEVACOR) 20 MG tablet Take 20 mg by mouth at bedtime.     Melatonin 5 MG TABS Take 1 tablet by mouth at bedtime as needed.     mirtazapine (REMERON) 30 MG tablet Take 30 mg by mouth at bedtime.     montelukast (SINGULAIR) 10 MG tablet Take 1 tablet (10 mg total) by mouth at bedtime. 30 tablet 1   Multiple Vitamin (THEREMS) TABS Take 1 tablet by mouth daily.     nicotine (NICODERM CQ - DOSED IN MG/24 HOURS) 14 mg/24hr patch Place 1 patch (14 mg total) onto the skin daily. Do not use if actively smoking cigarettes. 28 patch 0   Omega-3 Fatty Acids (FISH OIL) 1000 MG CPDR Take by mouth.     Pyridoxine HCl (  B-6) 100 MG TABS Take by mouth daily.     QUEtiapine (SEROQUEL) 200 MG tablet Take 200 mg by mouth at bedtime.     VITAMIN D, ERGOCALCIFEROL, PO Take 1,000 Units by mouth daily.       No current facility-administered medications for this visit.    Review of Systems  Constitutional:  Negative for appetite change, chills, fatigue, fever and unexpected weight change.  HENT:   Negative for hearing loss and voice change.   Eyes:  Negative for eye problems and icterus.  Respiratory:  Positive for cough and shortness of breath. Negative for chest tightness and hemoptysis.   Cardiovascular:  Negative for chest pain and leg swelling.  Gastrointestinal:  Negative for abdominal distention and abdominal pain.  Endocrine: Negative for hot flashes.  Genitourinary:  Negative for difficulty urinating, dysuria and frequency.   Musculoskeletal:  Negative for arthralgias.  Skin:  Negative for itching and rash.  Neurological:  Negative for light-headedness and numbness.  Hematological:  Negative for adenopathy. Does not bruise/bleed easily.  Psychiatric/Behavioral:  Negative for confusion.      PHYSICAL EXAMINATION: ECOG PERFORMANCE STATUS: 0 - Asymptomatic  Vitals:   02/03/23 1143  BP: (!) 125/99  Pulse: 92  Resp: 18  Temp: (!) 95.6 F (35.3 C)  SpO2: 96%   Filed Weights   02/03/23 1143  Weight: 233 lb 1.6 oz (105.7 kg)    Physical Exam Constitutional:      General: He is not in acute distress.    Appearance: He is not diaphoretic.  HENT:     Head: Normocephalic and atraumatic.  Eyes:     General: No scleral icterus.    Pupils: Pupils are equal, round, and reactive to light.  Cardiovascular:     Rate and Rhythm: Normal rate and regular rhythm.     Heart sounds: No murmur heard. Pulmonary:     Effort: Pulmonary effort is normal. No respiratory distress.     Comments: Decreased breath sound bilaterally. Abdominal:     General: There is no distension.     Palpations: Abdomen is soft.     Tenderness: There is no abdominal tenderness.  Musculoskeletal:        General: Normal range of motion.     Cervical back: Normal range of motion and neck supple.  Skin:     General: Skin is warm and dry.     Findings: No erythema.  Neurological:     Mental Status: He is alert and oriented to person, place, and time. Mental status is at baseline.     Cranial Nerves: No cranial nerve deficit.     Motor: No abnormal muscle tone.     Coordination: Coordination normal.  Psychiatric:        Mood and Affect: Affect normal.     Comments: Flat      LABORATORY DATA:  I have reviewed the data as listed    Latest Ref Rng & Units 02/03/2023   12:17 PM 12/12/2021    6:06 AM 12/07/2021    5:12 AM  CBC  WBC 4.0 - 10.5 K/uL 8.2  16.3  7.5   Hemoglobin 13.0 - 17.0 g/dL 16.1  09.6  04.5   Hematocrit 39.0 - 52.0 % 40.5  41.6  41.0   Platelets 150 - 400 K/uL 219  213  187       Latest Ref Rng & Units 02/03/2023   12:17 PM 06/20/2022   11:09 AM 12/13/2021    5:26  AM  CMP  Glucose 70 - 99 mg/dL 409     BUN 8 - 23 mg/dL 7     Creatinine 8.11 - 1.24 mg/dL 9.14  7.82  9.56   Sodium 135 - 145 mmol/L 137     Potassium 3.5 - 5.1 mmol/L 3.6     Chloride 98 - 111 mmol/L 103     CO2 22 - 32 mmol/L 23     Calcium 8.9 - 10.3 mg/dL 9.8     Total Protein 6.5 - 8.1 g/dL 7.4     Total Bilirubin 0.3 - 1.2 mg/dL 0.4     Alkaline Phos 38 - 126 U/L 83     AST 15 - 41 U/L 21     ALT 0 - 44 U/L 20        RADIOGRAPHIC STUDIES: I have personally reviewed the radiological images as listed and agreed with the findings in the report. NM PET Image Restag (PS) Skull Base To Thigh  Result Date: 01/30/2023 CLINICAL DATA:  Subsequent treatment strategy for lung cancer. EXAM: NUCLEAR MEDICINE PET SKULL BASE TO THIGH TECHNIQUE: 12.2 mCi F-18 FDG was injected intravenously. Full-ring PET imaging was performed from the skull base to thigh after the radiotracer. CT data was obtained and used for attenuation correction and anatomic localization. Fasting blood glucose: 106 mg/dl COMPARISON:  CT chest 21/30/8657, 06/20/2022 and PET 01/04/2022. FINDINGS: Mediastinal blood pool activity: SUV max 2.5  Liver activity: SUV max NA NECK: No abnormal hypermetabolism. Incidental CT findings: None. CHEST: New and increasingly hypermetabolic mediastinal lymph nodes. Index low right paratracheal lymph node measures 9 mm (4/54), SUV max 12.8, previously 4.1. Hypermetabolic spiculated apical left upper lobe nodule, better measured on 12/26/2022, SUV max 5.9. Nodule is new from prior PET. No additional abnormal hypermetabolism. Incidental CT findings: Atherosclerotic calcification of the aorta, aortic valve and coronary arteries. Enlarged pulmonic trunk. Heart size normal. No pericardial or pleural effusion. ABDOMEN/PELVIS: Misregistration artifact involving the liver and spleen. There may be mild hypermetabolism associated with the prostate. Otherwise, no abnormal hypermetabolism. Incidental CT findings: Liver is mildly heterogeneous. Faint stones in the gallbladder. 2.0 cm right adrenal nodule measures 4 Hounsfield units and 1.8 cm left adrenal nodule has macroscopic fat. No follow-up necessary. Low-attenuation lesions in the kidneys. No specific follow-up necessary. Spleen, pancreas, stomach and bowel are grossly unremarkable. Midline ventral hernia repair. Bladder wall thickening. SKELETON: No abnormal hypermetabolism. Incidental CT findings: Degenerative changes in the spine. IMPRESSION: 1. Recurrent bronchogenic carcinoma in the left upper lobe with new and increasingly hypermetabolic mediastinal lymph nodes. No evidence of distant metastatic disease. 2. Possible mild prostate hypermetabolism. Consider laboratory correlation. 3. Liver is mildly heterogeneous, raising suspicion for steatosis. 4. Cholelithiasis. 5. Right adrenal adenoma.  Left adrenal myelolipoma. 6. Aortic atherosclerosis (ICD10-I70.0). Coronary artery calcification. 7. Enlarged pulmonic trunk, indicative of pulmonary arterial hypertension. Electronically Signed   By: Leanna Battles M.D.   On: 01/30/2023 15:35

## 2023-02-04 NOTE — Assessment & Plan Note (Signed)
Recommend smoke cessation.  

## 2023-02-04 NOTE — Assessment & Plan Note (Signed)
CT scan and PET scan will reviewed and discussed with patient. Suspicious for recurrent lung cancer with lymph node involvement. Recommend MRI brain with and without contrast. Recommend patient to see pulmonology for biopsy via bronchoscopy.  Refer to Dr. Karna Christmas.  discussed the case with him via secure chat  If confirmed stage III non-small cell lung cancer, I will recommend concurrent chemotherapy with radiation.

## 2023-02-14 ENCOUNTER — Ambulatory Visit
Admission: RE | Admit: 2023-02-14 | Discharge: 2023-02-14 | Disposition: A | Discharge status: Home / Self Care | Payer: Medicare Other | Source: Ambulatory Visit | Attending: Oncology | Admitting: Oncology

## 2023-02-14 ENCOUNTER — Ambulatory Visit: Admission: RE | Admit: 2023-02-14 | Payer: Medicare Other | Source: Ambulatory Visit

## 2023-02-14 DIAGNOSIS — R948 Abnormal results of function studies of other organs and systems: Secondary | ICD-10-CM | POA: Diagnosis present

## 2023-02-14 DIAGNOSIS — R918 Other nonspecific abnormal finding of lung field: Secondary | ICD-10-CM | POA: Diagnosis present

## 2023-02-14 MED ORDER — GADOBUTROL 1 MMOL/ML IV SOLN
10.0000 mL | Freq: Once | INTRAVENOUS | Status: AC | PRN
  Administered 2023-02-14: 10 mL via INTRAVENOUS

## 2023-02-15 ENCOUNTER — Telehealth: Payer: Self-pay

## 2023-02-15 NOTE — Telephone Encounter (Signed)
Contacted [facility] pulmonology to follow up on referral. Spoke to referral coordinator who states that since pt is already established and just needs a bronchoscopy, she will send message to the person who scheduled those contact pt and set up appt.

## 2023-02-22 ENCOUNTER — Other Ambulatory Visit: Payer: Self-pay | Admitting: Pulmonary Disease

## 2023-02-22 DIAGNOSIS — R911 Solitary pulmonary nodule: Secondary | ICD-10-CM

## 2023-03-07 ENCOUNTER — Encounter
Admission: RE | Admit: 2023-03-07 | Discharge: 2023-03-07 | Disposition: A | Discharge status: Home / Self Care | Payer: Medicare Other | Source: Ambulatory Visit | Attending: Pulmonary Disease | Admitting: Pulmonary Disease

## 2023-03-07 ENCOUNTER — Other Ambulatory Visit: Payer: Self-pay

## 2023-03-07 HISTORY — DX: Elevated white blood cell count, unspecified: D72.829

## 2023-03-07 HISTORY — DX: Other nonspecific abnormal finding of lung field: R91.8

## 2023-03-07 HISTORY — DX: Ventral hernia without obstruction or gangrene: K43.9

## 2023-03-07 HISTORY — DX: Hypokalemia: E87.6

## 2023-03-07 HISTORY — DX: Coagulation defect, unspecified: D68.9

## 2023-03-07 NOTE — Patient Instructions (Signed)
Your procedure is scheduled on:  To find out your arrival time, please call [phone] between 1PM - 3PM on:    Report to the Registration Desk on the 1st floor of the [facility]. Valet parking is available.  If your arrival time is 6:00 am, do not arrive before that time as the [facility] entrance doors do not open until 6:00 am.  REMEMBER: Instructions that are not followed completely may result in serious medical risk, up to and including death; or upon the discretion of your surgeon and anesthesiologist your surgery may need to be rescheduled.  Do not eat food after midnight the night before surgery.  No gum chewing or hard candies.  You may however, drink CLEAR liquids up to 2 hours before you are scheduled to arrive for your surgery. Do not drink anything within 2 hours of your scheduled arrival time.  Clear liquids include: - water  - apple juice without pulp - gatorade (not RED colors) - black coffee or tea (Do NOT add milk or creamers to the coffee or tea) Do NOT drink anything that is not on this list.  Type 1 and Type 2 diabetics should only drink water.  In addition, your doctor has ordered for you to drink the provided:  Ensure Pre-Surgery Clear Carbohydrate Drink  Gatorade G2 Drinking this carbohydrate drink up to two hours before surgery helps to reduce insulin resistance and improve patient outcomes. Please complete drinking 2 hours before scheduled arrival time.  One week prior to surgery: Stop Anti-inflammatories (NSAIDS) such as Advil, Aleve, Ibuprofen, Motrin, Naproxen, Naprosyn and Aspirin based products such as Excedrin, Goody's Powder, BC Powder. You may however, continue to take Tylenol if needed for pain up until the day of surgery.  Stop ANY OVER THE COUNTER supplements until after surgery.  Continue taking all prescribed medications with the exception of the following:  **Follow guidelines for insulin and diabetes medications**  Follow  recommendations from Cardiologist or PCP regarding stopping blood thinners.  TAKE ONLY THESE MEDICATIONS THE MORNING OF SURGERY WITH A SIP OF WATER:    Antacid (take one the night before and one on the morning of surgery - helps to prevent nausea after surgery.)  Use inhalers on the day of surgery and bring to the hospital.  Fleets enema or bowel prep as directed.  No Alcohol for 24 hours before or after surgery.  No Smoking including e-cigarettes for 24 hours before surgery.  No chewable tobacco products for at least 6 hours before surgery.  No nicotine patches on the day of surgery.  Do not use any "recreational" drugs for at least a week (preferably 2 weeks) before your surgery.  Please be advised that the combination of cocaine and anesthesia may have negative outcomes, up to and including death. If you test positive for cocaine, your surgery will be cancelled.  On the morning of surgery brush your teeth with toothpaste and water, you may rinse your mouth with mouthwash if you wish. Do not swallow any toothpaste or mouthwash.  Use CHG Soap or wipes as directed on instruction sheet.  Do not wear lotions, powders, or perfumes.   Do not shave body hair from the neck down 48 hours before surgery.  Wear comfortable clothing (specific to your surgery type) to the hospital.  Do not wear jewelry, make-up, hairpins, clips or nail polish.  Contact lenses, hearing aids and dentures may not be worn into surgery.  Bring your C-PAP to the hospital in case  you may have to spend the night.   Do not bring valuables to the hospital. [facility] is not responsible for any missing/lost belongings or valuables.   Total Shoulder Arthroplasty:  use Benzoyl Peroxide 5% Gel as directed on instruction sheet.  Notify your doctor if there is any change in your medical condition (cold, fever, infection).  If you are being discharged the day of surgery, you will not be allowed to drive home. You  will need a responsible individual to drive you home and stay with you for 24 hours after surgery.   If you are taking public transportation, you will need to have a responsible individual with you.  If you are being admitted to the hospital overnight, leave your suitcase in the car. After surgery it may be brought to your room.  In case of increased patient census, it may be necessary for you, the patient, to continue your postoperative care in the Same Day Surgery department.  After surgery, you can help prevent lung complications by doing breathing exercises.  Take deep breaths and cough every 1-2 hours. Your doctor may order a device called an Incentive Spirometer to help you take deep breaths. When coughing or sneezing, hold a pillow firmly against your incision with both hands. This is called "splinting." Doing this helps protect your incision. It also decreases belly discomfort.  Surgery Visitation Policy:  Patients undergoing a surgery or procedure may have two family members or support persons with them as long as the person is not COVID-19 positive or experiencing its symptoms.   Inpatient Visitation:    Visiting hours are 7 a.m. to 8 p.m. Up to four visitors are allowed at one time in a patient room. The visitors may rotate out with other people during the day. One designated support person (adult) may remain overnight.  Due to an increase in RSV and influenza rates and associated hospitalizations, children ages 96 and under will not be able to visit patients in [facility]. Masks continue to be strongly recommended.  Please call the [facility] Dept. at [phone] if you have any questions about these instructions.

## 2023-03-07 NOTE — Pre-Procedure Instructions (Signed)
Pre Admit Testing interview done with patient. Answered questions appropriately. Reviewed medication list and provided verbal instructions to caregiver "Nanny" at group home. Understanding verbalized.

## 2023-03-10 ENCOUNTER — Ambulatory Visit
Admission: RE | Admit: 2023-03-10 | Discharge: 2023-03-10 | Disposition: A | Discharge status: Home / Self Care | Payer: Medicare Other | Source: Ambulatory Visit | Attending: Pulmonary Disease | Admitting: Pulmonary Disease

## 2023-03-10 DIAGNOSIS — R911 Solitary pulmonary nodule: Secondary | ICD-10-CM | POA: Insufficient documentation

## 2023-03-15 ENCOUNTER — Encounter
Admission: RE | Disposition: A | Discharge status: Home / Self Care | Payer: Self-pay | Source: Home / Self Care | Attending: Pulmonary Disease

## 2023-03-15 ENCOUNTER — Other Ambulatory Visit: Payer: Self-pay

## 2023-03-15 ENCOUNTER — Encounter: Payer: Self-pay | Admitting: *Deleted

## 2023-03-15 ENCOUNTER — Ambulatory Visit: Payer: Medicare Other | Admitting: Anesthesiology

## 2023-03-15 ENCOUNTER — Ambulatory Visit: Payer: Medicare Other

## 2023-03-15 ENCOUNTER — Ambulatory Visit
Admission: RE | Admit: 2023-03-15 | Discharge: 2023-03-15 | Disposition: A | Discharge status: Home / Self Care | Payer: Medicare Other | Attending: Pulmonary Disease | Admitting: Pulmonary Disease

## 2023-03-15 DIAGNOSIS — I10 Essential (primary) hypertension: Secondary | ICD-10-CM | POA: Insufficient documentation

## 2023-03-15 DIAGNOSIS — Z8673 Personal history of transient ischemic attack (TIA), and cerebral infarction without residual deficits: Secondary | ICD-10-CM | POA: Diagnosis not present

## 2023-03-15 DIAGNOSIS — Z720 Tobacco use: Secondary | ICD-10-CM

## 2023-03-15 DIAGNOSIS — T17890A Other foreign object in other parts of respiratory tract causing asphyxiation, initial encounter: Secondary | ICD-10-CM | POA: Diagnosis not present

## 2023-03-15 DIAGNOSIS — J984 Other disorders of lung: Secondary | ICD-10-CM | POA: Diagnosis not present

## 2023-03-15 DIAGNOSIS — D689 Coagulation defect, unspecified: Secondary | ICD-10-CM

## 2023-03-15 DIAGNOSIS — C3412 Malignant neoplasm of upper lobe, left bronchus or lung: Secondary | ICD-10-CM | POA: Diagnosis not present

## 2023-03-15 DIAGNOSIS — E669 Obesity, unspecified: Secondary | ICD-10-CM | POA: Insufficient documentation

## 2023-03-15 DIAGNOSIS — J4489 Other specified chronic obstructive pulmonary disease: Secondary | ICD-10-CM | POA: Diagnosis not present

## 2023-03-15 DIAGNOSIS — W44F9XA Other object of natural or organic material, entering into or through a natural orifice, initial encounter: Secondary | ICD-10-CM | POA: Insufficient documentation

## 2023-03-15 DIAGNOSIS — F17210 Nicotine dependence, cigarettes, uncomplicated: Secondary | ICD-10-CM | POA: Insufficient documentation

## 2023-03-15 DIAGNOSIS — K449 Diaphragmatic hernia without obstruction or gangrene: Secondary | ICD-10-CM | POA: Diagnosis not present

## 2023-03-15 DIAGNOSIS — Z6829 Body mass index (BMI) 29.0-29.9, adult: Secondary | ICD-10-CM | POA: Insufficient documentation

## 2023-03-15 DIAGNOSIS — R911 Solitary pulmonary nodule: Secondary | ICD-10-CM | POA: Diagnosis present

## 2023-03-15 HISTORY — PX: VIDEO BRONCHOSCOPY WITH ENDOBRONCHIAL ULTRASOUND: SHX6177

## 2023-03-15 LAB — APTT: aPTT: 44 seconds — ABNORMAL HIGH (ref 24–36)

## 2023-03-15 LAB — PROTIME-INR
INR: 1.2 (ref 0.8–1.2)
Prothrombin Time: 15.8 seconds — ABNORMAL HIGH (ref 11.4–15.2)

## 2023-03-15 SURGERY — BRONCHOSCOPY, WITH EBUS
Anesthesia: General

## 2023-03-15 MED ORDER — LACTATED RINGERS IV SOLN: INTRAVENOUS | Status: DC

## 2023-03-15 MED ORDER — SUGAMMADEX SODIUM 200 MG/2ML IV SOLN
INTRAVENOUS | Status: DC | PRN
  Administered 2023-03-15: 300 mg via INTRAVENOUS

## 2023-03-15 MED ORDER — CHLORHEXIDINE GLUCONATE 0.12 % MT SOLN
OROMUCOSAL | Status: AC
  Filled 2023-03-15: qty 15

## 2023-03-15 MED ORDER — OXYCODONE HCL 5 MG PO TABS: 5.0000 mg | ORAL_TABLET | Freq: Once | ORAL | Status: DC | PRN

## 2023-03-15 MED ORDER — OXYCODONE HCL 5 MG/5ML PO SOLN: 5.0000 mg | Freq: Once | ORAL | Status: DC | PRN

## 2023-03-15 MED ORDER — SEVOFLURANE IN SOLN
RESPIRATORY_TRACT | Status: AC
  Filled 2023-03-15: qty 250

## 2023-03-15 MED ORDER — FAMOTIDINE 20 MG PO TABS
20.0000 mg | ORAL_TABLET | Freq: Once | ORAL | Status: AC
  Administered 2023-03-15: 20 mg via ORAL

## 2023-03-15 MED ORDER — MIDAZOLAM HCL 2 MG/2ML IJ SOLN
INTRAMUSCULAR | Status: AC
  Filled 2023-03-15: qty 2

## 2023-03-15 MED ORDER — ALBUTEROL SULFATE HFA 108 (90 BASE) MCG/ACT IN AERS
INHALATION_SPRAY | RESPIRATORY_TRACT | Status: DC | PRN
Start: 2023-03-15 — End: 2023-03-15
  Administered 2023-03-15: 4 via RESPIRATORY_TRACT

## 2023-03-15 MED ORDER — ROCURONIUM BROMIDE 10 MG/ML (PF) SYRINGE
PREFILLED_SYRINGE | INTRAVENOUS | Status: AC
  Filled 2023-03-15: qty 10

## 2023-03-15 MED ORDER — PROPOFOL 10 MG/ML IV BOLUS
INTRAVENOUS | Status: DC | PRN
Start: 2023-03-15 — End: 2023-03-15
  Administered 2023-03-15: 200 mg via INTRAVENOUS
  Administered 2023-03-15: 100 ug/kg/min via INTRAVENOUS

## 2023-03-15 MED ORDER — CHLORHEXIDINE GLUCONATE 0.12 % MT SOLN
15.0000 mL | Freq: Once | OROMUCOSAL | Status: AC
  Administered 2023-03-15: 15 mL via OROMUCOSAL

## 2023-03-15 MED ORDER — ONDANSETRON HCL 4 MG/2ML IJ SOLN
INTRAMUSCULAR | Status: AC
  Filled 2023-03-15: qty 2

## 2023-03-15 MED ORDER — ONDANSETRON HCL 4 MG/2ML IJ SOLN
INTRAMUSCULAR | Status: DC | PRN
  Administered 2023-03-15: 4 mg via INTRAVENOUS

## 2023-03-15 MED ORDER — FENTANYL CITRATE (PF) 100 MCG/2ML IJ SOLN
INTRAMUSCULAR | Status: AC
  Filled 2023-03-15: qty 2

## 2023-03-15 MED ORDER — ACETAMINOPHEN 10 MG/ML IV SOLN: 1000.0000 mg | Freq: Once | INTRAVENOUS | Status: DC | PRN

## 2023-03-15 MED ORDER — ORAL CARE MOUTH RINSE: 15.0000 mL | Freq: Once | OROMUCOSAL | Status: AC

## 2023-03-15 MED ORDER — PROPOFOL 1000 MG/100ML IV EMUL
INTRAVENOUS | Status: AC
  Filled 2023-03-15: qty 100

## 2023-03-15 MED ORDER — LIDOCAINE HCL (CARDIAC) PF 100 MG/5ML IV SOSY
PREFILLED_SYRINGE | INTRAVENOUS | Status: DC | PRN
  Administered 2023-03-15: 100 mg via INTRAVENOUS

## 2023-03-15 MED ORDER — LIDOCAINE HCL (PF) 2 % IJ SOLN
INTRAMUSCULAR | Status: AC
  Filled 2023-03-15: qty 5

## 2023-03-15 MED ORDER — ROCURONIUM BROMIDE 100 MG/10ML IV SOLN
INTRAVENOUS | Status: DC | PRN
  Administered 2023-03-15 (×2): 20 mg via INTRAVENOUS
  Administered 2023-03-15: 50 mg via INTRAVENOUS

## 2023-03-15 MED ORDER — FENTANYL CITRATE (PF) 100 MCG/2ML IJ SOLN: 25.0000 ug | INTRAMUSCULAR | Status: DC | PRN

## 2023-03-15 MED ORDER — DEXAMETHASONE SODIUM PHOSPHATE 10 MG/ML IJ SOLN
INTRAMUSCULAR | Status: AC
  Filled 2023-03-15: qty 1

## 2023-03-15 MED ORDER — DEXAMETHASONE SODIUM PHOSPHATE 10 MG/ML IJ SOLN
INTRAMUSCULAR | Status: DC | PRN
  Administered 2023-03-15: 10 mg via INTRAVENOUS

## 2023-03-15 MED ORDER — FENTANYL CITRATE (PF) 100 MCG/2ML IJ SOLN
INTRAMUSCULAR | Status: DC | PRN
  Administered 2023-03-15 (×2): 50 ug via INTRAVENOUS

## 2023-03-15 MED ORDER — ONDANSETRON HCL 4 MG/2ML IJ SOLN: 4.0000 mg | Freq: Once | INTRAMUSCULAR | Status: DC | PRN

## 2023-03-15 NOTE — Transfer of Care (Signed)
Immediate Anesthesia Transfer of Care Note  Patient: [name]  Procedure(s) Performed: VIDEO BRONCHOSCOPY WITH ENDOBRONCHIAL ULTRASOUND ROBOTIC ASSISTED NAVIGATIONAL BRONCHOSCOPY  Patient Location: PACU  Anesthesia Type:General  Level of Consciousness: awake  Airway & Oxygen Therapy: Patient Spontanous Breathing and Patient connected to face mask oxygen  Post-op Assessment: Report given to RN and Post -op Vital signs reviewed and stable  Post vital signs: Reviewed and stable  Last Vitals:  Vitals Value Taken Time  BP 118/80 03/15/23 1432  Temp    Pulse 82 03/15/23 1435  Resp 18 03/15/23 1435  SpO2 100 % 03/15/23 1435  Vitals shown include unfiled device data.  Last Pain:  Vitals:   03/15/23 1216  TempSrc: Temporal  PainSc: 0-No pain         Complications: No notable events documented.

## 2023-03-15 NOTE — Procedures (Signed)
ROBOTIC NAVIGATIONAL BRONCHOSCOPY PROCEDURE NOTE  FIBEROPTIC BRONCHOSCOPY WITH BRONCHOALVEOLAR LAVAGE AND THERAPEUTIC ASPIRATION OF TRACHEOBRONCHIAL TREE PROCEDURE NOTE  ENDOBRONCHIAL ULTRASOUND PROCEDURE NOTE    Flexible bronchoscopy was performed  by : Karna Christmas MD  assistance by : 1)Repiratory therapist  and 2)[employer] staff and 3) Anesthesia team and 4) Flouroscopy team and 5) [facility] STAFF   Indication for the procedure was :  Pre-procedural H&P. The following assessment was performed on the day of the procedure prior to initiating sedation History:  Chest pain n Dyspnea y Hemoptysis n Cough y Fever n Other pertinent items n  Examination Vital signs -reviewed as per nursing documentation today Cardiac    Murmurs: n  Rubs : n  Gallop: n Lungs Wheezing: n Rales : n Rhonchi :y  Other pertinent findings: SOB/hypoxemia due to chronic lung disease   Pre-procedural assessment for Procedural Sedation included: Depth of sedation: As per anesthesia team  ASA Classification:  2 Mallampati airway assessment: 3    Medication list reviewed: y  The patient's interval history was taken and revealed: no new complaints The pre- procedure physical examination revealed: No new findings Refer to prior clinic note for details.  Informed Consent: Informed consent was obtained from:  patient after explanation of procedure and risks, benefits, as well as alternative procedures available.  Explanation of level of sedation and possible transfusion was also provided.    Procedural Preparation: Time out was performed and patient was identified by name and birthdate and procedure to be performed and side for sampling, if any, was specified. Pt was intubated by anesthesia.  The patient was appropriately draped.   Fiberoptic bronchoscopy with airway inspection and BAL Procedure findings:  Bronchoscope was inserted via ETT  without difficulty.  Posterior oropharynx,  epiglottis, arytenoids, false cords and vocal cords were not visualized as these were bypassed by endotracheal tube. The distal trachea was normal in circumference and appearance without mucosal, cartilaginous or branching abnormalities.  The main carina was mildly splayed . All right and left lobar airways were visualized to the Subsegmental level.  Sub- sub segmental carinae were identified in all the distal airways.   Secretions were visible in the following airways and appeared to be clear.  The mucosa was : friable at LEFT UPPER LOBE  Airways were notable for:        exophytic lesions :n       extrinsic compression in the following distributions: n.       Friable mucosa: y       Teacher, music /pigmentation: n   MUCUS PLUGGING WAS SEVERE ON LEFT SIDE AND COMPLETE OBSTRUCTED MULTIPLE AIRWAYS.  UNABLE TO NAVIGATE THROUGH TRACHEOBRONCHIAL TREE DUE TO SEVERE MUCUS PLUGGING. THERAPEUTIC ASPIRATION WAS PERFORMED X 7 AND WAS DONE BILATERALLY ALTHOUGH WORSE ON LEFT.  BAL WAS PERFORMED AT LEFT UPPER LOBE X 2.    Post procedure Diagnosis:   SEVERE MUCUS PLUGGING WORSE ON LEFT WITH FRIABLE MUCOSA OF LEFT UPPER LOBE.  LEFT UPPER LOBE BLEEDING NOTED PRIOR TO ANY BRUSHING OR BIOPSIES     Electromagnetic Navigational Bronchoscopy Procedure Findings:  After appropriate CT-guided planning [facility] scope was advanced via endotracheal tube to target lesion of left upper lobe.  Post appropriate planning and registration peripheral navigation was used for tissue sampling.    CYTOBRUSH - LEFT UPPER LOBE X 1 = LESIONAL ATYPICAL CELLS NOTED BY CYTOTECH 2.  TRANSBRONCHIAL FNA LEFT UPPER LOBE  X 1 - DIRECTLY INTO CYTOLYTE 3. 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 Examination began to evaluate for pathologically enlarged lymph nodes starting on the RIGHT  side progressing to the LEFT side.  All lymph node biopsies performed with 21G  needle. Lymph node biopsies were sent in cytolite for all stations.  STATION 10R - 6mm not biopsied STATION 7 - 1.2cm biopsied 3 times STATION 10L - 5mm not biopsied  STATION 4L - 6mm not biopsied   Post procedure diagnosis:  hilar lymphdenopathy    Specimens obtained included:                 Cytology brushes : left upper lobe x 1   Broncho-alveolar lavage site:left upper lobe   sent for cytology                              40 ml volume infused 25 ml volume returned with sersang appearance  Endobronchial biopsy site:  surgical pathology ; sent for cytology                                  Transbronchial biopsy site: left upper lobe FNA ; sent for cytology       Fluoroscopy Used: yes ;        Pictorial documentation attached: none                    Immediate sampling complications included:NONE  Epinephrine ZERO ml was used topically  The bronchoscopy was terminated due to completion of the planned procedure and the bronchoscope was removed.   Total dosage of Lidocaine was ZERO mg Total fluoroscopy time was AS PER RADIOLOGY  minutes  Supplemental oxygen was provided at AS PER ANESTHESIA TEAM  lpm by nasal canula post operatively  Estimated Blood loss: EXPECTED <5 cc.  Complications included:  NONE IMMEDIATE   Preliminary CXR findings :  IN PROCESS   Disposition: BACK TO GROUP HOME AND WILL FOLLOW UP IN 1 WK WITH [facility]   Follow up with Dr. Karna Christmas in 5 days for result discussion.     Vida Rigger MD  [facility] Division of Pulmonary & Critical Care Medicine

## 2023-03-15 NOTE — Discharge Instructions (Signed)

## 2023-03-15 NOTE — Anesthesia Preprocedure Evaluation (Signed)
Anesthesia Evaluation  Patient identified by MRN, date of birth, ID band Patient awake    Reviewed: Allergy & Precautions, NPO status , Patient's Chart, lab work & pertinent test results  History of Anesthesia Complications Negative for: history of anesthetic complications  Airway Mallampati: IV   Neck ROM: Full    Dental  (+) Edentulous Upper, Edentulous Lower   Pulmonary COPD, Current Smoker (2 cigarettes per day)Patient did not abstain from smoking. Lung CA   Pulmonary exam normal breath sounds clear to auscultation       Cardiovascular hypertension, Normal cardiovascular exam Rhythm:Regular Rate:Normal  ECG 03/15/23: NSR   Neuro/Psych  PSYCHIATRIC DISORDERS  Depression  Schizophrenia  CVA (1978, no residual deficits)    GI/Hepatic hiatal hernia,,,  Endo/Other  Obesity   Renal/GU negative Renal ROS     Musculoskeletal   Abdominal   Peds  Hematology negative hematology ROS (+)   Anesthesia Other Findings   Reproductive/Obstetrics                              Anesthesia Physical Anesthesia Plan  ASA: 3  Anesthesia Plan: General   Post-op Pain Management:    Induction: Intravenous  PONV Risk Score and Plan: 1 and Ondansetron, Dexamethasone and Treatment may vary due to age or medical condition  Airway Management Planned: Oral ETT  Additional Equipment:   Intra-op Plan:   Post-operative Plan: Extubation in OR  Informed Consent: I have reviewed the patients History and Physical, chart, labs and discussed the procedure including the risks, benefits and alternatives for the proposed anesthesia with the patient or authorized representative who has indicated his/her understanding and acceptance.     Dental advisory given  Plan Discussed with: CRNA  Anesthesia Plan Comments: (Patient consented for risks of anesthesia including but not limited to:  - adverse reactions to  medications - damage to eyes, teeth, lips or other oral mucosa - nerve damage due to positioning  - sore throat or hoarseness - damage to heart, brain, nerves, lungs, other parts of body or loss of life  Informed patient about role of CRNA in peri- and intra-operative care.  Patient voiced understanding.)         Anesthesia Quick Evaluation

## 2023-03-15 NOTE — Anesthesia Procedure Notes (Signed)
Procedure Name: Intubation Date/Time: 03/15/2023 1:02 PM  Performed by: Elisabeth Pigeon, CRNAPre-anesthesia Checklist: Patient identified, Patient being monitored, Timeout performed, Emergency Drugs available and Suction available Patient Re-evaluated:Patient Re-evaluated prior to induction Oxygen Delivery Method: Circle system utilized Preoxygenation: Pre-oxygenation with 100% oxygen Induction Type: IV induction Ventilation: Mask ventilation without difficulty Laryngoscope Size: Mac, McGraph and 4 Grade View: Grade I Tube type: Oral Tube size: 8.5 mm Number of attempts: 1 Airway Equipment and Method: Stylet Placement Confirmation: ETT inserted through vocal cords under direct vision, positive ETCO2 and breath sounds checked- equal and bilateral Secured at: 24 cm Tube secured with: Tape Dental Injury: Teeth and Oropharynx as per pre-operative assessment

## 2023-03-15 NOTE — H&P (Signed)
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  He was evaluated with medical oncology with recommendations for tissue sampling in order to provide therapy for possible cancer.  Discussed these findings with the patient he is very interested in diagnosis/treatment and wishes to have fully aggressive therapy.  Today is here for bronchoscopic airway inspection, lung biopsy of left upper lobe nodule and endobronchial ultrasound evaluation of hilar and mediastinal stations for lymphadenopathy for possible metastatic implants.  Reviewed risks/complications and benefits with patient, risks include infection, pneumothorax/pneumomediastinum which may require chest tube placement as well as overnight/prolonged hospitalization and possible mechanical ventilation. Other risks include bleeding and very rarely death.  Patient understands risks and wishes to proceed.  Additional questions were answered, and patient is aware that post procedure patient will be going home with family and may experience cough with possible clots on expectoration as well as phlegm which may last few days as well as hoarseness of voice post intubation and mechanical ventilation.    PAST MEDICAL HISTORY   Past Medical History:  Diagnosis Date   Acute on chronic respiratory failure with hypoxia (HCC)    Asthma    Coagulation disorder (HCC)    COPD (chronic obstructive  pulmonary disease) (HCC)    Depression    History of hiatal hernia    Hypertension    Hypokalemia    Leukocytosis    Lung mass    Pneumonia    Schizophrenia (HCC)    Shortness of breath dyspnea    Stroke [facility])    Umbilical hernia    Ventral hernia      SURGICAL HISTORY   Past Surgical History:  Procedure Laterality Date   COLONOSCOPY WITH PROPOFOL N/A 09/21/2021   Procedure: COLONOSCOPY WITH PROPOFOL;  Surgeon: Regis Bill, MD;  Location: [facility] ENDOSCOPY;  Service: Endoscopy;  Laterality: N/A;   HERNIA REPAIR     INSERTION OF MESH N/A 12/18/2014   Procedure: INSERTION OF MESH;  Surgeon: Lattie Haw, MD;  Location: [facility] ORS;  Service: General;  Laterality: N/A;   SUPRA-UMBILICAL HERNIA  12/18/2014   Procedure: SUPRA-UMBILICAL HERNIA;  Surgeon: Lattie Haw, MD;  Location: [facility] ORS;  Service: General;;   UMBILICAL HERNIA REPAIR N/A 12/18/2014   Procedure: HERNIA REPAIR UMBILICAL ADULT;  Surgeon: Lattie Haw, MD;  Location: [facility] ORS;  Service: General;  Laterality: N/A;     FAMILY HISTORY   Family History  Problem Relation Age of Onset   Asthma Mother    Hypertension Father      SOCIAL HISTORY   Social History   Tobacco Use   Smoking status: Every Day    Current packs/day: 0.15    Average packs/day: 0.2 packs/day for 45.0 years (6.8 ttl pk-yrs)    Types: Cigarettes   Smokeless tobacco: Never  Vaping Use   Vaping  status: Never Used  Substance Use Topics   Alcohol use: Not Currently    Alcohol/week: 75.0 standard drinks of alcohol    Types: 75 Cans of beer per week   Drug use: Not Currently    Types: Marijuana, "Crack" cocaine    Comment: none in 15 yrs     MEDICATIONS    Home Medication:    Current Medication:  Current Facility-Administered Medications:    famotidine (PEPCID) tablet 20 mg, 20 mg, Oral, Once, Verlee Monte, NP   lactated ringers infusion, , Intravenous, Continuous, Reed Breech, MD, Last Rate: 10 mL/hr at 03/15/23  1218, New Bag at 03/15/23 1218    ALLERGIES   Patient has no known allergies.     REVIEW OF SYSTEMS    Review of Systems:  Gen:  Denies  fever, sweats, chills weigh loss  HEENT: Denies blurred vision, double vision, ear pain, eye pain, hearing loss, nose bleeds, sore throat Cardiac:  No dizziness, chest pain or heaviness, chest tightness,edema Resp:   reports dyspnea chronically  Gi: Denies swallowing difficulty, stomach pain, nausea or vomiting, diarrhea, constipation, bowel incontinence Gu:  Denies bladder incontinence, burning urine Ext:   Denies Joint pain, stiffness or swelling Skin: Denies  skin rash, easy bruising or bleeding or hives Endoc:  Denies polyuria, polydipsia , polyphagia or weight change Psych:   Denies depression, insomnia or hallucinations   Other:  All other systems negative   VS: BP (!) 149/96   Pulse 92   Temp 97.6 F (36.4 C) (Temporal)   Resp 16   Ht 6\' 1"  (1.854 m)   Wt 101.6 kg   SpO2 96%   BMI 29.55 kg/m      PHYSICAL EXAM    GENERAL:NAD, no fevers, chills, no weakness no fatigue HEAD: Normocephalic, atraumatic.  EYES: Pupils equal, round, reactive to light. Extraocular muscles intact. No scleral icterus.  MOUTH: Moist mucosal membrane. Dentition intact. No abscess noted.  EAR, NOSE, THROAT: Clear without exudates. No external lesions.  NECK: Supple. No thyromegaly. No nodules. No JVD.  PULMONARY: decreased breath sounds with mild rhonchi worse at bases bilaterally.  CARDIOVASCULAR: S1 and S2. Regular rate and rhythm. No murmurs, rubs, or gallops. No edema. Pedal pulses 2+ bilaterally.  GASTROINTESTINAL: Soft, nontender, nondistended. No masses. Positive bowel sounds. No hepatosplenomegaly.  MUSCULOSKELETAL: No swelling, clubbing, or edema. Range of motion full in all extremities.  NEUROLOGIC: Cranial nerves II through XII are intact. No gross focal neurological deficits. Sensation intact. Reflexes intact.  SKIN: No ulceration,  lesions, rashes, or cyanosis. Skin warm and dry. Turgor intact.  PSYCHIATRIC: Mood, affect within normal limits. The patient is awake, alert and oriented x 3. Insight, judgment intact.       IMAGING   CT chest with -  Unchanged 9 mm right paratracheal node on image number 68/2. Stable 9 mm precarinal node image number 76/2. Stable 8 mm subcarinal node on image 83/2. The esophagus is unremarkable. The thyroid gland is normal.   Lungs/Pleura: Stable advanced emphysematous changes and areas of pulmonary scarring. Stable radiation changes involving the right upper lobe. Small residual treated nodule measures 7 x 5 mm. It measured 9 x 5 mm on the prior CT. No findings suspicious for recurrent disease.   left upper lobe pulmonary nodule in between branching vessels in the left upper lobe has increased in size since the prior study. It is difficult to measure accurately because of the adjacent vessels but on the sagittal reformatted images it measures  approximately 16 x 10 mm on image 123/6 and when measured on the same sagittal image on the prior study it measured 9 x 6 mm. Hazy surrounding ground-glass opacity is again demonstrated. 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 Repeat PET-CT may be confirmatory.  ASSESSMENT/PLAN   Left upper lobe spiculated nodule     -concerning for primary bronchogenic carcinoma    - eft upper lobe pulmonary nodule in between branching vessels in the left upper lobe has increased in size since the prior study. It is difficult to measure accurately because of the adjacent vessels but on the sagittal reformatted images it measures approximately 16 x 10 mm on image 123/6 and when measured on the same sagittal image on the prior study it measured 9 x 6 mm. Hazy surrounding ground-glass opacity is again demonstrated. 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  -plan today for robotic bronchoscopy with endobronchial  ultrasound, we will perform BAL and evacuate any mucus plugging with theraputic aspiration of tracheobronchial tree.  -patient understands the complexity of this procedure especially with vascular structures sorrounding this lesion.  He was asked to stop plavix and states he thinks he did stop it.  I have explained to him he may bleed profusely if he forgot to stop plavix but he states he probably did not forget.  Regardless patient is willing to accept there risks of procedure including bleeding and death, knowing there is quite a bit of emphysema and vascular structures in left upper lobe.   -Reviewed risks/complications and benefits with patient, risks include infection, pneumothorax/pneumomediastinum which may require chest tube placement as well as overnight/prolonged hospitalization and possible mechanical ventilation. Other risks include bleeding and very rarely death.  Patient understands risks and wishes to proceed.  Additional questions were answered, and patient is aware that post procedure patient will be going home with family and may experience cough with possible clots on expectoration as well as phlegm which may last few days as well as hoarseness of voice post intubation and mechanical ventilation.             Thank you for allowing me to participate in the care of this patient.   Patient/Family are satisfied with care plan and all questions have been answered.    Provider disclosure: Patient with at least one acute or chronic illness or injury that poses a threat to life or bodily function and is being managed actively during this encounter.  All of the below services have been performed independently by signing provider:  review of prior documentation from internal and or external health records.  Review of previous and current lab results.  Interview and comprehensive assessment during patient visit today. Review of current and previous chest radiographs/CT scans. Discussion  of management and test interpretation with health care team and patient/family.   This document was prepared using Dragon voice recognition software and may include unintentional dictation errors.     Vida Rigger, M.D.  Division of Pulmonary & Critical Care Medicine

## 2023-03-16 ENCOUNTER — Encounter: Payer: Self-pay | Admitting: Pulmonary Disease

## 2023-03-16 NOTE — Anesthesia Postprocedure Evaluation (Signed)
Anesthesia Post Note  Patient: [name]  Procedure(s) Performed: VIDEO BRONCHOSCOPY WITH ENDOBRONCHIAL ULTRASOUND ROBOTIC ASSISTED NAVIGATIONAL BRONCHOSCOPY  Patient location during evaluation: PACU Anesthesia Type: General Level of consciousness: awake and alert, oriented and patient cooperative Pain management: pain level controlled Vital Signs Assessment: post-procedure vital signs reviewed and stable Respiratory status: spontaneous breathing, nonlabored ventilation and respiratory function stable Cardiovascular status: blood pressure returned to baseline and stable Postop Assessment: adequate PO intake Anesthetic complications: no   No notable events documented.   Last Vitals:  Vitals:   03/15/23 1515 03/15/23 1535  BP: 120/86 (!) 140/81  Pulse: 87 95  Resp: 20 18  Temp: (!) 36.2 C (!) 36.2 C  SpO2: 92% 92%    Last Pain:  Vitals:   03/15/23 1535  TempSrc: Temporal  PainSc: 0-No pain                 Reed Breech

## 2023-03-20 ENCOUNTER — Other Ambulatory Visit: Payer: Self-pay | Admitting: Pathology

## 2023-03-22 ENCOUNTER — Encounter: Payer: Self-pay | Admitting: Oncology

## 2023-03-22 ENCOUNTER — Inpatient Hospital Stay: Payer: Medicare Other | Attending: Oncology | Admitting: Oncology

## 2023-03-22 VITALS — BP 133/84 | HR 93 | Temp 96.0°F | Resp 18 | Wt 221.2 lb

## 2023-03-22 DIAGNOSIS — J432 Centrilobular emphysema: Secondary | ICD-10-CM | POA: Insufficient documentation

## 2023-03-22 DIAGNOSIS — Z7982 Long term (current) use of aspirin: Secondary | ICD-10-CM | POA: Diagnosis not present

## 2023-03-22 DIAGNOSIS — D3501 Benign neoplasm of right adrenal gland: Secondary | ICD-10-CM | POA: Diagnosis not present

## 2023-03-22 DIAGNOSIS — Z7189 Other specified counseling: Secondary | ICD-10-CM | POA: Diagnosis not present

## 2023-03-22 DIAGNOSIS — F203 Undifferentiated schizophrenia: Secondary | ICD-10-CM

## 2023-03-22 DIAGNOSIS — I700 Atherosclerosis of aorta: Secondary | ICD-10-CM | POA: Insufficient documentation

## 2023-03-22 DIAGNOSIS — Z79899 Other long term (current) drug therapy: Secondary | ICD-10-CM | POA: Diagnosis not present

## 2023-03-22 DIAGNOSIS — C3412 Malignant neoplasm of upper lobe, left bronchus or lung: Secondary | ICD-10-CM | POA: Insufficient documentation

## 2023-03-22 DIAGNOSIS — K8020 Calculus of gallbladder without cholecystitis without obstruction: Secondary | ICD-10-CM | POA: Diagnosis not present

## 2023-03-22 DIAGNOSIS — J9621 Acute and chronic respiratory failure with hypoxia: Secondary | ICD-10-CM | POA: Diagnosis not present

## 2023-03-22 DIAGNOSIS — F209 Schizophrenia, unspecified: Secondary | ICD-10-CM | POA: Diagnosis not present

## 2023-03-22 DIAGNOSIS — Z593 Problems related to living in residential institution: Secondary | ICD-10-CM | POA: Diagnosis not present

## 2023-03-22 DIAGNOSIS — Z7902 Long term (current) use of antithrombotics/antiplatelets: Secondary | ICD-10-CM | POA: Diagnosis not present

## 2023-03-22 DIAGNOSIS — F17210 Nicotine dependence, cigarettes, uncomplicated: Secondary | ICD-10-CM | POA: Diagnosis not present

## 2023-03-22 DIAGNOSIS — D3502 Benign neoplasm of left adrenal gland: Secondary | ICD-10-CM | POA: Diagnosis not present

## 2023-03-22 DIAGNOSIS — Z720 Tobacco use: Secondary | ICD-10-CM | POA: Diagnosis not present

## 2023-03-22 DIAGNOSIS — I3139 Other pericardial effusion (noninflammatory): Secondary | ICD-10-CM | POA: Diagnosis not present

## 2023-03-22 DIAGNOSIS — C3492 Malignant neoplasm of unspecified part of left bronchus or lung: Secondary | ICD-10-CM | POA: Diagnosis not present

## 2023-03-22 DIAGNOSIS — I10 Essential (primary) hypertension: Secondary | ICD-10-CM | POA: Insufficient documentation

## 2023-03-22 DIAGNOSIS — Z8673 Personal history of transient ischemic attack (TIA), and cerebral infarction without residual deficits: Secondary | ICD-10-CM | POA: Diagnosis not present

## 2023-03-22 NOTE — Assessment & Plan Note (Signed)
Discussed with patient

## 2023-03-22 NOTE — Progress Notes (Addendum)
CLINICAL TRIAL SELECTED FOR SCREENING - Non-Small Cell Lung  Trial: A Phase III, Double-blind, Placebo-controlled, Randomised, Multicentre, International Study of Durvalumab Plus Oleclumab and Durvalumab Plus Monalizumab in Patients With Locally Advanced (Stage III), Unresectable Non-small Cell Lung Cancer (NSCLC)  Who Have Not Progressed Following Definitive, Platinum-Based Concurrent Chemoradiation Therapy  **Trial eligibility and accrual should be confirmed by your research team**  Patient Characteristics: Preoperative or Nonsurgical Candidate (Clinical Staging), Stage III - Nonsurgical Candidate (Nonsquamous and Squamous), PS = 0, 1 Therapeutic Status: Preoperative or Nonsurgical Candidate (Clinical Staging) AJCC T Category: cT1b AJCC N Category: cN3 AJCC M Category: cM0 AJCC 8 Stage Grouping: IIIB ECOG Performance Status: 1 Intent of Therapy: Curative Intent, Discussed with Patient

## 2023-03-22 NOTE — Assessment & Plan Note (Signed)
Patient lives in group home.  Continue follow-up with primary care provider. On Seroquel and mirtazapine

## 2023-03-22 NOTE — Progress Notes (Signed)
Hematology/Oncology Consult Note [phone] [phone]     REFERRING PROVIDER: Emogene Morgan, MD    CHIEF COMPLAINTS/PURPOSE OF CONSULTATION:  Mass  ASSESSMENT & PLAN:   Cancer Staging  Adenocarcinoma of left lung [facility]) Staging form: Lung, AJCC 8th Edition - Clinical stage from 03/22/2023: cT1, cN3, cM0 - Signed by [name], MD on 03/22/2023   Adenocarcinoma of left lung [facility]) CT scan and PET scan results were reviewed and discussed with patient. He has right paratracheal lymph node hypermetabolic activity Bronchoscopy results were reviewed.  Left upper lobe adenocarcinoma, Station 7 lymph node showed atypical cell No enough tissue for NGS, will send off liquid biopsy Clinically he has Stage III left lung cancer. He declines surgery evaluation.  Recommend concurrent chemotherapy with radiation. Discussed with Dr. Rushie Chestnut.  I explained to the patient the risks and benefits of chemotherapy including all but not limited to infusion reaction, hair loss, hearing loss, mouth sore, nausea, vomiting, low blood counts, bleeding, heart failure, kidney failure and risk of life threatening infection and even death, secondary malignancy etc.   Patient voices understanding and willing to proceed chemotherapy.   # Chemotherapy education; Medi port placement.  Antiemetics-Zofran and Compazine;  Supportive care measures are necessary for patient well-being and will be provided as necessary. We spent sufficient time to discuss many aspect of care, questions were answered to patient's satisfaction.   Goals of care, counseling/discussion Discussed with patient.   Tobacco use Recommend smoke cessation.  Schizophrenia (HCC) Patient lives in group home.  Continue follow-up with primary care provider. On Seroquel and mirtazapine    Orders Placed This Encounter  Procedures   CBC with Differential (Cancer Center Only)    Standing Status:   Future    Standing Expiration  Date:   04/10/2024   CMP (Cancer Center only)    Standing Status:   Future    Standing Expiration Date:   04/10/2024   Follow up lab MD chemo week of 9/3 All questions were answered. The patient knows to call the clinic with any problems, questions or concerns.  [name], MD, PhD [facility] Hematology Oncology 03/22/2023    HISTORY OF PRESENTING ILLNESS:  [name] 62 y.o. male presents to establish care for lung mass I have reviewed his chart and materials related to his cancer extensively and collaborated history with the patient. Summary of oncologic history is as follows:  12/08/2021 CT chest with contrast showed irregular solid pulmonary nodule of right upper lobe, increased in size.  Bibasilar consolidation.  Mildly enlarged right lower paratracheal lymph node.  Atherosclerosis. 01/04/2022 PET scan showed 10 mm irregular nodule in the lateral right upper lobe, suspicious for primary bronchogenic neoplasm. 2 small right paratracheal nodes, indeterminate and favored to be reactive.  Attention on follow-up.  Patient was seen by radiation oncology Dr. Rushie Chestnut.  Patient was offered SBRT to the right upper lobe nodule/presumed non-small cell lung cancer stage I.  Patient lives in a group home.  He reports chronic cough.  Some shortness of breath with exertion. He is a current everyday smoker.  Few cigarettes per day.  He denies any unintentional weight loss, night sweats, fever or chills. Patient is on aspirin and Plavix   INTERVAL HISTORY [name] is a 62 y.o. male who has above history reviewed by me today presents for follow up visit for lung cancer.  Oncology History  Adenocarcinoma of left lung (HCC)  12/29/2022 Imaging   CT chest with contrast showed  Stable radiation changes involving the right upper lobe with a small residual treated nodule.  Decrease in size.  Interval enlargement of the left upper lobe pulmonary nodule.  Stable mediastinal nodes stable advanced  emphysematous changes and areas of pulmonary scarring.  Stable bilateral adrenal gland adenomas.  Emphysema   01/30/2023 Imaging   PET 1. Recurrent bronchogenic carcinoma in the left upper lobe with new and increasingly hypermetabolic mediastinal lymph nodes. No evidence of distant metastatic disease. 2. Possible mild prostate hypermetabolism. Consider laboratory correlation. 3. Liver is mildly heterogeneous, raising suspicion for steatosis. 4. Cholelithiasis. 5. Right adrenal adenoma.  Left adrenal myelolipoma. 6. Aortic atherosclerosis (ICD10-I70.0). Coronary artery calcification. 7. Enlarged pulmonic trunk, indicative of pulmonary arterial hypertension.   03/15/2023 Initial Diagnosis   Adenocarcinoma of left lung   Bronchoscopy showed  Lung, biopsy, Left upper lobe - NON-SMALL CELL CARCINOMA, FAVOR ADENOCARCINOMA.  1. Bronchial Lavage, Left upper lobe - POSITIVE FOR MALIGNANCY. - NON-SMALL CELL CARCINOMA, FAVOR ADENOCARCINOMA. - SEE NOTE. 2. Bronchial Brushing, Left upper lobe - POSITIVE FOR MALIGNANCY. - NON-SMALL CELL CARCINOMA, FAVOR ADENOCARCINOMA. 3. Bronchus, biopsy, left upper lobe - SEE [id number] 4. Fine Needle Aspiration, left upper lobe - POSITIVE FOR MALIGNANCY. - NON-SMALL CELL CARCINOMA, FAVOR ADENOCARCINOMA. 5. Fine Needle Aspiration, station 7 lymph node - ATYPICAL. - BACKGROUND LYMPHOID TISSUE IS PRESENT CONSISTENT WITH LYMPH NODE SAMPLING.    03/15/2023 Procedure   Fiberoptic bronchoscopy with airway inspection and BAL Procedure findings:   Bronchoscope was inserted via ETT  without difficulty.  Posterior oropharynx, epiglottis, arytenoids, false cords and vocal cords were not visualized as these were bypassed by endotracheal tube. The distal trachea was normal in circumference and appearance without mucosal, cartilaginous or branching abnormalities.  The main carina was mildly splayed . All right and left lobar airways were visualized to the Subsegmental  level.  Sub- sub segmental carinae were identified in all the distal airways.   Secretions were visible in the following airways and appeared to be clear.  The mucosa was : friable at LEFT UPPER LOBE   Airways were notable for:        exophytic lesions :n       extrinsic compression in the following distributions: n.       Friable mucosa: y       Teacher, music /pigmentation: n   MUCUS PLUGGING WAS SEVERE ON LEFT SIDE AND COMPLETE OBSTRUCTED MULTIPLE AIRWAYS.  UNABLE TO NAVIGATE THROUGH TRACHEOBRONCHIAL TREE DUE TO SEVERE MUCUS PLUGGING. THERAPEUTIC ASPIRATION WAS PERFORMED X 7 AND WAS DONE BILATERALLY ALTHOUGH WORSE ON LEFT.  BAL WAS PERFORMED AT LEFT UPPER LOBE X 2.     Endobronchial ultrasound assisted hilar and mediastinal lymph node biopsies procedure findings: The fiberoptic bronchoscope was removed and the EBUS scope was introduced. Examination began to evaluate for pathologically enlarged lymph nodes starting on the RIGHT  side progressing to the LEFT side.  All lymph node biopsies performed with 21G  needle. Lymph node biopsies were sent in cytolite for all stations.   STATION 10R - 6mm not biopsied STATION 7 - 1.2cm biopsied 3 times STATION 10L - 5mm not biopsied  STATION 4L - 6mm not biopsied    03/22/2023 Cancer Staging   Staging form: Lung, AJCC 8th Edition - Clinical stage from 03/22/2023: cT1, cN3, cM0 - Signed by [name], MD on 03/22/2023 Stage prefix: Initial diagnosis   04/12/2023 -  Chemotherapy   Patient is on Treatment Plan : LUNG Carboplatin + Paclitaxel + XRT q7d  Patient presents to discuss management plan.    MEDICAL HISTORY:  Past Medical History:  Diagnosis Date   Acute on chronic respiratory failure with hypoxia (HCC)    Asthma    Coagulation disorder (HCC)    COPD (chronic obstructive pulmonary disease) (HCC)    Depression    History of hiatal hernia    Hypertension    Hypokalemia    Leukocytosis    Lung mass    Pneumonia     Schizophrenia (HCC)    Shortness of breath dyspnea    Stroke [facility])    Umbilical hernia    Ventral hernia     SURGICAL HISTORY: Past Surgical History:  Procedure Laterality Date   COLONOSCOPY WITH PROPOFOL N/A 09/21/2021   Procedure: COLONOSCOPY WITH PROPOFOL;  Surgeon: Regis Bill, MD;  Location: [facility] ENDOSCOPY;  Service: Endoscopy;  Laterality: N/A;   HERNIA REPAIR     INSERTION OF MESH N/A 12/18/2014   Procedure: INSERTION OF MESH;  Surgeon: Lattie Haw, MD;  Location: [facility] ORS;  Service: General;  Laterality: N/A;   SUPRA-UMBILICAL HERNIA  12/18/2014   Procedure: SUPRA-UMBILICAL HERNIA;  Surgeon: Lattie Haw, MD;  Location: [facility] ORS;  Service: General;;   UMBILICAL HERNIA REPAIR N/A 12/18/2014   Procedure: HERNIA REPAIR UMBILICAL ADULT;  Surgeon: Lattie Haw, MD;  Location: [facility] ORS;  Service: General;  Laterality: N/A;   VIDEO BRONCHOSCOPY WITH ENDOBRONCHIAL ULTRASOUND N/A 03/15/2023   Procedure: VIDEO BRONCHOSCOPY WITH ENDOBRONCHIAL ULTRASOUND;  Surgeon: Vida Rigger, MD;  Location: [facility] ORS;  Service: Thoracic;  Laterality: N/A;    SOCIAL HISTORY: Social History   Socioeconomic History   Marital status: Widowed    Spouse name: Not on file   Number of children: Not on file   Years of education: Not on file   Highest education level: Not on file  Occupational History   Not on file  Tobacco Use   Smoking status: Every Day    Current packs/day: 0.15    Average packs/day: 0.2 packs/day for 45.0 years (6.8 ttl pk-yrs)    Types: Cigarettes   Smokeless tobacco: Never  Vaping Use   Vaping status: Never Used  Substance and Sexual Activity   Alcohol use: Not Currently    Alcohol/week: 75.0 standard drinks of alcohol    Types: 75 Cans of beer per week   Drug use: Not Currently    Types: Marijuana, "Crack" cocaine    Comment: none in 15 yrs   Sexual activity: Not on file  Other Topics Concern   Not on file  Social History Narrative   Not on file    Social Determinants of Health   Financial Resource Strain: Not on file  Food Insecurity: No Food Insecurity (02/03/2023)   Hunger Vital Sign    Worried About Running Out of Food in the Last Year: Never true    Ran Out of Food in the Last Year: Never true  Transportation Needs: No Transportation Needs (02/03/2023)   PRAPARE - Administrator, Civil Service (Medical): No    Lack of Transportation (Non-Medical): No  Physical Activity: Not on file  Stress: Not on file  Social Connections: Not on file  Intimate Partner Violence: Not At Risk (02/03/2023)   Humiliation, Afraid, Rape, and Kick questionnaire    Fear of Current or Ex-Partner: No    Emotionally Abused: No    Physically Abused: No    Sexually Abused: No    FAMILY HISTORY: Family History  Problem Relation Age of Onset   Asthma Mother    Hypertension Father     ALLERGIES:  has No Known Allergies.  MEDICATIONS:  Current Outpatient Medications  Medication Sig Dispense Refill   acetaminophen (TYLENOL) 325 MG tablet Take 650 mg by mouth 2 (two) times daily as needed for mild pain.     albuterol (VENTOLIN HFA) 108 (90 Base) MCG/ACT inhaler Inhale 2 puffs into the lungs every 4 (four) hours as needed for wheezing or shortness of breath.     amantadine (SYMMETREL) 100 MG capsule Take 100 mg by mouth 2 (two) times daily.     aspirin EC 81 MG tablet Take 81 mg by mouth daily. Swallow whole.     diltiazem (CARDIZEM CD) 180 MG 24 hr capsule Take 180 mg by mouth daily.      docusate sodium (COLACE) 100 MG capsule Take 100 mg by mouth 2 (two) times daily as needed for mild constipation.     famotidine (PEPCID) 20 MG tablet Take 1 tablet (20 mg total) by mouth daily. 30 tablet 0   Fluticasone-Umeclidin-Vilant (TRELEGY ELLIPTA) 100-62.5-25 MCG/ACT AEPB Inhale 1 puff into the lungs daily. 28 each 1   gabapentin (NEURONTIN) 100 MG capsule Take 100 mg by mouth at bedtime.      guaiFENesin (MUCINEX) 600 MG 12 hr tablet Take 600  mg by mouth 2 (two) times daily as needed.     hydrochlorothiazide (HYDRODIURIL) 12.5 MG tablet Take 12.5 mg by mouth daily.     INVEGA TRINZA 819 MG/2.625ML SUSY Inject 819 mg into the muscle every 3 (three) months.      ipratropium-albuterol (DUONEB) 0.5-2.5 (3) MG/3ML SOLN Take 3 mLs by nebulization every 6 (six) hours as needed (every 4 to 6 hours as needed for shortnes of breath or wheeziing).      ketoconazole (NIZORAL) 2 % cream Apply to both feet and between toes once daily for 6 weeks. 60 g 1   lovastatin (MEVACOR) 20 MG tablet Take 20 mg by mouth at bedtime.     Melatonin 5 MG TABS Take 10 mg by mouth at bedtime.     mirtazapine (REMERON) 30 MG tablet Take 30 mg by mouth at bedtime.     montelukast (SINGULAIR) 10 MG tablet Take 1 tablet (10 mg total) by mouth at bedtime. 30 tablet 1   Multiple Vitamin (THEREMS) TABS Take 1 tablet by mouth daily.     nicotine (NICODERM CQ - DOSED IN MG/24 HOURS) 14 mg/24hr patch Place 1 patch (14 mg total) onto the skin daily. Do not use if actively smoking cigarettes. 28 patch 0   Omega-3 Fatty Acids (FISH OIL) 1000 MG CPDR Take 1,000 mg by mouth at bedtime.     Pyridoxine HCl (B-6) 100 MG TABS Take 1 tablet by mouth daily.     traZODone (DESYREL) 100 MG tablet Take 100 mg by mouth at bedtime.     VITAMIN D, ERGOCALCIFEROL, PO Take 1,000 Units by mouth daily.      clopidogrel (PLAVIX) 75 MG tablet Take 75 mg by mouth daily. (Patient not taking: Reported on 03/15/2023)     dextromethorphan-guaiFENesin (ROBITUSSIN-DM) 10-100 MG/5ML liquid Take 10 mLs by mouth every 6 (six) hours as needed for cough. (Patient not taking: Reported on 03/07/2023)     QUEtiapine (SEROQUEL) 200 MG tablet Take 200 mg by mouth at bedtime. (Patient not taking: Reported on 03/07/2023)     No current facility-administered medications for this visit.    Review of  Systems  Constitutional:  Negative for appetite change, chills, fatigue, fever and unexpected weight change.  HENT:    Negative for hearing loss and voice change.   Eyes:  Negative for eye problems and icterus.  Respiratory:  Positive for cough and shortness of breath. Negative for chest tightness and hemoptysis.   Cardiovascular:  Negative for chest pain and leg swelling.  Gastrointestinal:  Negative for abdominal distention and abdominal pain.  Endocrine: Negative for hot flashes.  Genitourinary:  Negative for difficulty urinating, dysuria and frequency.   Musculoskeletal:  Negative for arthralgias.  Skin:  Negative for itching and rash.  Neurological:  Negative for light-headedness and numbness.  Hematological:  Negative for adenopathy. Does not bruise/bleed easily.  Psychiatric/Behavioral:  Negative for confusion.      PHYSICAL EXAMINATION: ECOG PERFORMANCE STATUS: 0 - Asymptomatic  Vitals:   03/22/23 1007  BP: 133/84  Pulse: 93  Resp: 18  Temp: (!) 96 F (35.6 C)  SpO2: 98%   Filed Weights   03/22/23 1007  Weight: 221 lb 3.2 oz (100.3 kg)    Physical Exam Constitutional:      General: He is not in acute distress.    Appearance: He is not diaphoretic.  HENT:     Head: Normocephalic and atraumatic.  Eyes:     General: No scleral icterus.    Pupils: Pupils are equal, round, and reactive to light.  Cardiovascular:     Rate and Rhythm: Normal rate and regular rhythm.     Heart sounds: No murmur heard. Pulmonary:     Effort: Pulmonary effort is normal. No respiratory distress.     Comments: Decreased breath sound bilaterally. Abdominal:     General: There is no distension.     Palpations: Abdomen is soft.     Tenderness: There is no abdominal tenderness.  Musculoskeletal:        General: Normal range of motion.     Cervical back: Normal range of motion and neck supple.  Skin:    General: Skin is warm and dry.     Findings: No erythema.  Neurological:     Mental Status: He is alert and oriented to person, place, and time. Mental status is at baseline.     Cranial Nerves: No  cranial nerve deficit.     Motor: No abnormal muscle tone.     Coordination: Coordination normal.  Psychiatric:        Mood and Affect: Affect normal.     Comments: Flat      LABORATORY DATA:  I have reviewed the data as listed    Latest Ref Rng & Units 02/03/2023   12:17 PM 12/12/2021    6:06 AM 12/07/2021    5:12 AM  CBC  WBC 4.0 - 10.5 K/uL 8.2  16.3  7.5   Hemoglobin 13.0 - 17.0 g/dL 75.6  43.3  29.5   Hematocrit 39.0 - 52.0 % 40.5  41.6  41.0   Platelets 150 - 400 K/uL 219  213  187       Latest Ref Rng & Units 02/03/2023   12:17 PM 06/20/2022   11:09 AM 12/13/2021    5:26 AM  CMP  Glucose 70 - 99 mg/dL 188     BUN 8 - 23 mg/dL 7     Creatinine 4.16 - 1.24 mg/dL 6.06  3.01  6.01   Sodium 135 - 145 mmol/L 137     Potassium 3.5 - 5.1 mmol/L 3.6  Chloride 98 - 111 mmol/L 103     CO2 22 - 32 mmol/L 23     Calcium 8.9 - 10.3 mg/dL 9.8     Total Protein 6.5 - 8.1 g/dL 7.4     Total Bilirubin 0.3 - 1.2 mg/dL 0.4     Alkaline Phos 38 - 126 U/L 83     AST 15 - 41 U/L 21     ALT 0 - 44 U/L 20        RADIOGRAPHIC STUDIES: I have personally reviewed the radiological images as listed and agreed with the findings in the report. CT CHEST WO CONTRAST  Result Date: 03/16/2023 CLINICAL DATA:  Left upper lobe pulmonary nodule. EXAM: CT CHEST WITHOUT CONTRAST TECHNIQUE: Multidetector CT imaging of the chest was performed following the standard protocol without IV contrast. RADIATION DOSE REDUCTION: This exam was performed according to the departmental dose-optimization program which includes automated exposure control, adjustment of the mA and/or kV according to patient size and/or use of iterative reconstruction technique. COMPARISON:  PET-CT 01/26/2023 and chest CT 12/26/2022. FINDINGS: Cardiovascular: The heart size is normal. No substantial pericardial effusion. Coronary artery calcification is evident. Mild atherosclerotic calcification is noted in the wall of the thoracic aorta.  Mediastinum/Nodes: 9 mm short axis low right paratracheal node is stable. No mediastinal lymphadenopathy. No evidence for gross hilar lymphadenopathy although assessment is limited by the lack of intravenous contrast on the current study. The esophagus has normal imaging features. There is no axillary lymphadenopathy. Lungs/Pleura: Centrilobular and paraseptal emphysema evident. The irregular spiculated left upper lobe pulmonary lesion is progressive in the interval. Lobular component now projects posterior to a bronchus located along the posterior margin of the lesion, clearly progressive in the interval. Measuring on axial images today, the lesion is 1.6 x 1.4 cm which compares to 0.8 x 0.8 cm when hree measuring in a similar fashion at a similar level on the prior study. Previous report documented measurements on sagittal imaging of 1.6 x 1.0 cm. Again, due to adjacent vessels and airways in the irregular shape of this lesion, reproducible measurement is difficult, but measuring at approximately the same location on sagittal imaging today, the lesion is 1.6 x 1.4 cm. Stable post treatment scarring right upper lung peripherally. 0.7 x 0.7 cm parahilar right lung nodule on 114/4 is similar to minimally more confluent in the interval. 3 mm left lower lobe nodule on 107/4 is stable. No focal airspace consolidation. There is no evidence of pleural effusion. Upper Abdomen: Tiny well-defined homogeneous low-density lesions in both kidneys are too small to characterize but are statistically most likely benign and probably cysts. No followup imaging is recommended. Tiny calcified gallstones evident. Stable small bilateral adrenal adenomas. No followup imaging is recommended. Musculoskeletal: No worrisome lytic or sclerotic osseous abnormality. IMPRESSION: 1. Interval progression of the irregular spiculated left upper lobe pulmonary lesion. Imaging features are highly suspicious for malignancy. Lesion was noted to be  hypermetabolic on previous PET-CT. 2. 0.7 x 0.7 cm parahilar right lung nodule is similar to minimally more confluent in the interval. Continued close attention on follow-up recommended. 3. Stable post treatment scarring right upper lung peripherally. 4. Stable small bilateral adrenal adenomas. 5. Cholelithiasis. 6. Aortic Atherosclerosis (ICD10-I70.0) and Emphysema (ICD10-J43.9). Electronically Signed   By: Kennith Center M.D.   On: 03/16/2023 17:02   DG Chest Port 1 View  Result Date: 03/15/2023 CLINICAL DATA:  Status post bronchoscopy EXAM: PORTABLE CHEST 1 VIEW COMPARISON:  CT 03/10/2023 FINDINGS: No pneumothorax  post bronchoscopy. Spiculated nodule on chest CT is faintly visualized. There are diffuse ill-defined opacities in the left lung primarily in the upper lobe, possibly related to bronchoscopy or hemorrhage. No significant pleural effusion. Stable heart size and mediastinal contours. IMPRESSION: 1. No pneumothorax post bronchoscopy. 2. Diffuse ill-defined opacities in the left lung primarily in the upper lobe, possibly related to bronchoscopy or hemorrhage. Electronically Signed   By: Narda Rutherford M.D.   On: 03/15/2023 15:18   DG C-Arm 1-60 Min-No Report  Result Date: 03/15/2023 Fluoroscopy was utilized by the requesting physician.  No radiographic interpretation.

## 2023-03-22 NOTE — Assessment & Plan Note (Signed)
CT scan and PET scan results were reviewed and discussed with patient. He has right paratracheal lymph node hypermetabolic activity Bronchoscopy results were reviewed.  Left upper lobe adenocarcinoma, Station 7 lymph node showed atypical cell No enough tissue for NGS, will send off liquid biopsy Clinically he has Stage III left lung cancer. He declines surgery evaluation.  Recommend concurrent chemotherapy with radiation. Discussed with Dr. Rushie Chestnut.  I explained to the patient the risks and benefits of chemotherapy including all but not limited to infusion reaction, hair loss, hearing loss, mouth sore, nausea, vomiting, low blood counts, bleeding, heart failure, kidney failure and risk of life threatening infection and even death, secondary malignancy etc.   Patient voices understanding and willing to proceed chemotherapy.   # Chemotherapy education; Medi port placement.  Antiemetics-Zofran and Compazine;  Supportive care measures are necessary for patient well-being and will be provided as necessary. We spent sufficient time to discuss many aspect of care, questions were answered to patient's satisfaction.

## 2023-03-22 NOTE — Assessment & Plan Note (Signed)
Recommend smoke cessation.  

## 2023-03-22 NOTE — Progress Notes (Signed)
START ON PATHWAY REGIMEN - Non-Small Cell Lung     A cycle is every 7 days, concurrent with RT:     Paclitaxel      Carboplatin   **Always confirm dose/schedule in your pharmacy ordering system**  Patient Characteristics: Preoperative or Nonsurgical Candidate (Clinical Staging), Stage III - Nonsurgical Candidate (Nonsquamous and Squamous), PS = 0, 1 Therapeutic Status: Preoperative or Nonsurgical Candidate (Clinical Staging) AJCC T Category: cT1b AJCC N Category: cN3 AJCC M Category: cM0 AJCC 8 Stage Grouping: IIIB ECOG Performance Status: 1 Intent of Therapy: Curative Intent, Discussed with Patient

## 2023-03-23 ENCOUNTER — Other Ambulatory Visit: Payer: Self-pay

## 2023-03-23 ENCOUNTER — Other Ambulatory Visit: Payer: Medicare Other

## 2023-03-23 NOTE — Addendum Note (Signed)
Addended by: [name] on: 03/23/2023 08:44 AM   Modules accepted: Orders

## 2023-03-24 ENCOUNTER — Other Ambulatory Visit: Payer: Self-pay

## 2023-03-24 ENCOUNTER — Emergency Department
Admission: EM | Admit: 2023-03-24 | Discharge: 2023-03-25 | Disposition: A | Discharge status: Other Acute Inpatient Hospital | Payer: Medicare Other | Source: Home / Self Care | Attending: Emergency Medicine | Admitting: Emergency Medicine

## 2023-03-24 ENCOUNTER — Encounter: Payer: Self-pay | Admitting: Emergency Medicine

## 2023-03-24 DIAGNOSIS — I10 Essential (primary) hypertension: Secondary | ICD-10-CM | POA: Insufficient documentation

## 2023-03-24 DIAGNOSIS — J449 Chronic obstructive pulmonary disease, unspecified: Secondary | ICD-10-CM | POA: Insufficient documentation

## 2023-03-24 DIAGNOSIS — R440 Auditory hallucinations: Secondary | ICD-10-CM

## 2023-03-24 DIAGNOSIS — F29 Unspecified psychosis not due to a substance or known physiological condition: Secondary | ICD-10-CM | POA: Insufficient documentation

## 2023-03-24 DIAGNOSIS — G4700 Insomnia, unspecified: Secondary | ICD-10-CM

## 2023-03-24 DIAGNOSIS — F209 Schizophrenia, unspecified: Secondary | ICD-10-CM | POA: Insufficient documentation

## 2023-03-24 LAB — COMPREHENSIVE METABOLIC PANEL
ALT: 21 U/L (ref 0–44)
AST: 18 U/L (ref 15–41)
Albumin: 3.5 g/dL (ref 3.5–5.0)
Alkaline Phosphatase: 70 U/L (ref 38–126)
Anion gap: 8 (ref 5–15)
BUN: 9 mg/dL (ref 8–23)
CO2: 25 mmol/L (ref 22–32)
Calcium: 9.2 mg/dL (ref 8.9–10.3)
Chloride: 105 mmol/L (ref 98–111)
Creatinine, Ser: 0.72 mg/dL (ref 0.61–1.24)
GFR, Estimated: >60 mL/min (ref >60)
Glucose, Bld: 105 mg/dL — ABNORMAL HIGH (ref 70–99)
Potassium: 3.4 mmol/L — ABNORMAL LOW (ref 3.5–5.1)
Sodium: 138 mmol/L (ref 135–145)
Total Bilirubin: 0.7 mg/dL (ref 0.3–1.2)
Total Protein: 7 g/dL (ref 6.5–8.1)

## 2023-03-24 LAB — CBC
HCT: 39.4 % (ref 39.0–52.0)
Hemoglobin: 13 g/dL (ref 13.0–17.0)
MCH: 29.6 pg (ref 26.0–34.0)
MCHC: 33 g/dL (ref 30.0–36.0)
MCV: 89.7 fL (ref 80.0–100.0)
Platelets: 187 10*3/uL (ref 150–400)
RBC: 4.39 MIL/uL (ref 4.22–5.81)
RDW: 13.6 % (ref 11.5–15.5)
WBC: 7.8 10*3/uL (ref 4.0–10.5)
nRBC: 0 % (ref 0.0–0.2)

## 2023-03-24 LAB — ACETAMINOPHEN LEVEL: Acetaminophen (Tylenol), Serum: <10 ug/mL — ABNORMAL LOW (ref 10–30)

## 2023-03-24 LAB — SALICYLATE LEVEL: Salicylate Lvl: <7 mg/dL — ABNORMAL LOW (ref 7.0–30.0)

## 2023-03-24 LAB — ETHANOL: Alcohol, Ethyl (B): <10 mg/dL (ref <10)

## 2023-03-24 NOTE — BH Assessment (Signed)
Comprehensive Clinical Assessment (CCA) Note  03/24/2023 [name] [id number]  Chief Complaint:  Chief Complaint  Patient presents with   Insomnia   Visit Diagnosis: Schizophrenia   [name] is a 62 year old male who presents to the ER due to the symptoms of his mental illness increasing. Patient believes his medications are no longer working for him. The last week he has started to experience AV/H, as they were thirty years ago. He also reports he hasn't slept in four days. When this happened thirty years ago, he had to be hospitalized and since then he has done well. His last medications adjustment was over five years ago. Per his report, the voices are getting louder and clearer, they are telling him to end his life and to harm others. However, he has no desire or intentions of following through with it. He shared the voices and visual hallucinations are bothering him and he is unable to manage because of it. During the interview, the patient was calm, cooperative and pleasant. He was able to provide appropriate answers to the questions.  CCA Screening, Triage and Referral (STR)  Patient Reported Information How did you hear about [place]? Self  What Is the Reason for Your Visit/Call Today? Patient came to the ER because his medications are no longer working. She's not sleeping and hearing voices.  How Long Has This Been Causing You Problems? <Week  What Do You Feel Would Help You the Most Today? Treatment for Depression or other mood problem   Have You Recently Had Any Thoughts About Hurting Yourself? No  Are You Planning to Commit Suicide/Harm Yourself At This time? No   Flowsheet Row ED from 03/24/2023 in [facility] at [facility] Admission (Discharged) from 03/15/2023 in [facility] Pre-Admission Testing 45 from 03/07/2023 in [facility] PRE ADMISSION TESTING  C-SSRS RISK CATEGORY No Risk No  Risk No Risk       Have you Recently Had Thoughts About Hurting Someone Karolee Ohs? No  Are You Planning to Harm Someone at This Time? No  Explanation: No data recorded  Have You Used Any Alcohol or Drugs in the Past 24 Hours? No  What Did You Use and How Much? No data recorded  Do You Currently Have a Therapist/Psychiatrist? Yes  Name of Therapist/Psychiatrist: Name of Therapist/Psychiatrist: [facility]   Have You Been Recently Discharged From Any Office Practice or Programs? No  Explanation of Discharge From Practice/Program: No data recorded    CCA Screening Triage Referral Assessment Type of Contact: Face-to-Face  Telemedicine Service Delivery:   Is this Initial or Reassessment?   Date Telepsych consult ordered in [facility]:    Time Telepsych consult ordered in [facility]:    Location of Assessment: [facility] ED  Provider Location: [facility] ED   Collateral Involvement: No data recorded  Does Patient Have a Court Appointed Legal Guardian? No  Legal Guardian Contact Information: No data recorded Copy of Legal Guardianship Form: No data recorded Legal Guardian Notified of Arrival: No data recorded Legal Guardian Notified of Pending Discharge: No data recorded If Minor and Not Living with Parent(s), Who has Custody? No data recorded Is CPS involved or ever been involved? Never  Is APS involved or ever been involved? Never   Patient Determined To Be At Risk for Harm To Self or Others Based on Review of Patient Reported Information or Presenting Complaint? Yes, for Self-Harm  Method: No data recorded Availability of Means: No data  recorded Intent: No data recorded Notification Required: No data recorded Additional Information for Danger to Others Potential: No data recorded Additional Comments for Danger to Others Potential: No data recorded Are There Guns or Other Weapons in Your Home? No  Types of Guns/Weapons: No data recorded Are These Weapons Safely Secured?                             No  Who Could Verify You Are Able To Have These Secured: No data recorded Do You Have any Outstanding Charges, Pending Court Dates, Parole/Probation? No data recorded Contacted To Inform of Risk of Harm To Self or Others: No data recorded   Does Patient Present under Involuntary Commitment? No   [place] of Residence: [place]   Patient Currently Receiving the Following Services: Medication Management   Determination of Need: Emergent (2 hours)   Options For Referral: ED Visit  CCA Biopsychosocial Patient Reported Schizophrenia/Schizoaffective Diagnosis in Past: Yes (Per the patient.)   Strengths: Patient has insight, stable housing and seeking help   Mental Health Symptoms Depression:   Change in energy/activity; Difficulty Concentrating   Duration of Depressive symptoms:  Duration of Depressive Symptoms: Greater than two weeks   Mania:   Change in energy/activity; Recklessness   Anxiety:    Worrying; Difficulty concentrating   Psychosis:   Other negative symptoms   Duration of Psychotic symptoms:  Duration of Psychotic Symptoms: Less than six months   Trauma:   N/A   Obsessions:   N/A   Compulsions:   N/A   Inattention:   N/A   Hyperactivity/Impulsivity:   N/A   Oppositional/Defiant Behaviors:   N/A   Emotional Irregularity:   N/A   Other Mood/Personality Symptoms:  No data recorded   Mental Status Exam Appearance and self-care  Stature:   Average   Weight:   Average weight   Clothing:   Neat/clean; Age-appropriate   Grooming:   Normal   Cosmetic use:   None   Posture/gait:   Normal   Motor activity:   -- (Within normal range)   Sensorium  Attention:   Normal   Concentration:   Scattered   Orientation:   X5   Recall/memory:   Normal   Affect and Mood  Affect:   Anxious; Depressed; Full Range   Mood:   Depressed; Anxious   Relating  Eye contact:   Normal   Facial expression:   Depressed;  Responsive; Sad   Attitude toward examiner:   Cooperative   Thought and Language  Speech flow:  Clear and Coherent; Normal   Thought content:   Appropriate to Mood and Circumstances   Preoccupation:   None   Hallucinations:   None   Organization:   Intact; Loose   Company secretary of Knowledge:   Average   Intelligence:   Average   Abstraction:   Functional   Judgement:   Impaired   Reality Testing:   Adequate   Insight:   Fair   Decision Making:   Normal   Social Functioning  Social Maturity:   Responsible   Social Judgement:   Normal; "Street Smart"   Stress  Stressors:   Transitions; Other (Comment)   Coping Ability:   Normal   Skill Deficits:   None   Supports:   Friends/Service system     Religion: Religion/Spirituality Are You A Religious Person?: No  Leisure/Recreation: Leisure / Recreation Do You Have Hobbies?:  No  Exercise/Diet: Exercise/Diet Do You Exercise?: No Have You Gained or Lost A Significant Amount of Weight in the Past Six Months?: No Do You Follow a Special Diet?: No Do You Have Any Trouble Sleeping?: Yes Explanation of Sleeping Difficulties: Haven't sleep in four days.   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 Been Impacted by Current Illness: No  Education: Education Is Patient Currently Attending School?: No Did You Have An Individualized Education Program (IIEP): No Did You Have Any Difficulty At [employer]?: No Patient's Education Has Been Impacted by Current Illness: No   CCA Family/Childhood History Family and Relationship History: Family history Marital status: Widowed Does patient have children?: No  Childhood History:  Childhood History By whom was/is the patient raised?: Both parents Did patient suffer any verbal/emotional/physical/sexual abuse as a child?: No Did patient suffer from severe  childhood neglect?: No Has patient ever been sexually abused/assaulted/raped as an adolescent or adult?: No Was the patient ever a victim of a crime or a disaster?: No Witnessed domestic violence?: No Has patient been affected by domestic violence as an adult?: No       CCA Substance Use Alcohol/Drug Use: Alcohol / Drug Use Pain Medications: See PTA Prescriptions: See PTA Over the Counter: See PTA History of alcohol / drug use?: No history of alcohol / drug abuse Longest period of sobriety (when/how long): n/a   ASAM's:  Six Dimensions of Multidimensional Assessment  Dimension 1:  Acute Intoxication and/or Withdrawal Potential:      Dimension 2:  Biomedical Conditions and Complications:      Dimension 3:  Emotional, Behavioral, or Cognitive Conditions and Complications:     Dimension 4:  Readiness to Change:     Dimension 5:  Relapse, Continued use, or Continued Problem Potential:     Dimension 6:  Recovery/Living Environment:     ASAM Severity Score:    ASAM Recommended Level of Treatment:     Substance use Disorder (SUD)    Recommendations for Services/Supports/Treatments:    Discharge Disposition:    DSM5 Diagnoses: Patient Active Problem List   Diagnosis Date Noted   Goals of care, counseling/discussion 03/22/2023   Tobacco use 02/04/2023   Adenocarcinoma of left lung (HCC) 12/12/2021   Leukocytosis 12/12/2021   Schizophrenia (HCC)    Nicotine dependence    Hypokalemia 04/13/2020   Essential hypertension 04/13/2020   Coagulation disorder (HCC) 12/16/2019   Pain due to onychomycosis of toenails of both feet 02/14/2019   COPD exacerbation (HCC) 01/16/2017   Ventral hernia 12/18/2014   Hematoma of leg 01/30/2014    Referrals to Alternative Service(s): Referred to Alternative Service(s):   Place:   Date:   Time:    Referred to Alternative Service(s):   Place:   Date:   Time:    Referred to Alternative Service(s):   Place:   Date:   Time:    Referred to  Alternative Service(s):   Place:   Date:   Time:     Lilyan Gilford MS, LCAS, [facility], [facility] Therapeutic Triage Specialist 03/24/2023 1:45 PM

## 2023-03-24 NOTE — ED Provider Notes (Signed)
[facility] Provider Note    Event Date/Time   First MD Initiated Contact with Patient 03/24/23 [phone]     (approximate)  History   Chief Complaint: Insomnia  HPI  [name] is a 62 y.o. male with a past medical history of schizophrenia, hypertension, COPD, presents to the emergency department for insomnia and auditory hallucinations.  According to the patient for the past 4 nights he has not been able to sleep and since yesterday he has been experiencing auditory hallucinations where he is hearing people who are not there.  Patient states he has been prescribed melatonin, trazodone, Remeron, Seroquel and none of which have helped for his sleep.  He states he has not slept any over the past 4 nights.  No SI or HI.  No other medical complaints.  Physical Exam   Triage Vital Signs: ED Triage Vitals  Encounter Vitals Group     BP 03/24/23 0655 114/85     Systolic BP Percentile --      Diastolic BP Percentile --      Pulse Rate 03/24/23 0655 76     Resp 03/24/23 0655 18     Temp 03/24/23 0655 97.7 F (36.5 C)     Temp Source 03/24/23 0655 Oral     SpO2 03/24/23 0655 100 %     Weight 03/24/23 0653 221 lb 5.5 oz (100.4 kg)     Height 03/24/23 0653 6\' 1"  (1.854 m)     Head Circumference --      Peak Flow --      Pain Score 03/24/23 0653 0     Pain Loc --      Pain Education --      Exclude from Growth Chart --     Most recent vital signs: Vitals:   03/24/23 0655  BP: 114/85  Pulse: 76  Resp: 18  Temp: 97.7 F (36.5 C)  SpO2: 100%    General: Awake, no distress.  CV:  Good peripheral perfusion.  Regular rate and rhythm  Resp:  Normal effort.  Equal breath sounds bilaterally.  Abd:  No distention.  Soft, nontender.  No rebound or guarding.  ED Results / Procedures / Treatments   MEDICATIONS ORDERED IN ED: Medications - No data to display   IMPRESSION / MDM / ASSESSMENT AND PLAN / ED COURSE  I reviewed the triage vital signs and the  nursing notes.  Patient's presentation is most consistent with acute presentation with potential threat to life or bodily function.  Patient presents emergency department for 4 days of insomnia now with auditory hallucinations.  Has a patient has already been on significant amounts of sleep aids such as melatonin trazodone Remeron and Seroquel with no effect I believe at this point given the auditory hallucinations we will need to have psychiatry TTS involved as the patient may require higher strength sleep aids, medication adjustments and possibly psychiatric monitoring given his auditory hallucinations.  No SI or HI.  Does not appear to be a threat to himself or anyone else currently.  Patient is aware that the hallucinations are not real.  None actively in the emergency department.  We will check labs and have psychiatry TTS evaluate.  Patient is agreeable to this plan of care.  Lab work is reassuring occluding a normal CBC, normal chemistry negative alcohol salicylate and acetaminophen levels.  Awaiting psychiatric evaluation.  Patient care signed out to oncoming provider.  FINAL CLINICAL IMPRESSION(S) / ED DIAGNOSES  Auditory hallucinations Insomnia   Note:  This document was prepared using Dragon voice recognition software and may include unintentional dictation errors.   Minna Antis, MD 03/24/23 1302

## 2023-03-24 NOTE — ED Triage Notes (Signed)
Pt presents to triage via [facility] from home with complaints of insomnia. Per EMS, the patient has been prescribed 4 sleep aids, which have been unsuccessful. Hx of CA (unsure which type) and starts chemo next week. States he feels like he's going "crazy due to lack of sleep". A&Ox4 at this time. Denies SI/HI, hallucinations, CP or SOB.

## 2023-03-24 NOTE — ED Notes (Signed)
Pt ambulated to restroom. 

## 2023-03-25 ENCOUNTER — Inpatient Hospital Stay
Admission: AD | Admit: 2023-03-25 | Discharge: 2023-04-07 | DRG: 885 | Disposition: A | Discharge status: Home / Self Care | Payer: Medicare Other | Source: Intra-hospital | Attending: Psychiatry | Admitting: Psychiatry

## 2023-03-25 ENCOUNTER — Other Ambulatory Visit: Payer: Self-pay

## 2023-03-25 ENCOUNTER — Encounter: Payer: Self-pay | Admitting: Psychiatry

## 2023-03-25 DIAGNOSIS — Z825 Family history of asthma and other chronic lower respiratory diseases: Secondary | ICD-10-CM | POA: Diagnosis not present

## 2023-03-25 DIAGNOSIS — C3490 Malignant neoplasm of unspecified part of unspecified bronchus or lung: Secondary | ICD-10-CM | POA: Diagnosis present

## 2023-03-25 DIAGNOSIS — F17210 Nicotine dependence, cigarettes, uncomplicated: Secondary | ICD-10-CM | POA: Diagnosis present

## 2023-03-25 DIAGNOSIS — Z8249 Family history of ischemic heart disease and other diseases of the circulatory system: Secondary | ICD-10-CM

## 2023-03-25 DIAGNOSIS — F419 Anxiety disorder, unspecified: Secondary | ICD-10-CM | POA: Diagnosis present

## 2023-03-25 DIAGNOSIS — F32A Depression, unspecified: Secondary | ICD-10-CM | POA: Diagnosis present

## 2023-03-25 DIAGNOSIS — Z7902 Long term (current) use of antithrombotics/antiplatelets: Secondary | ICD-10-CM | POA: Diagnosis not present

## 2023-03-25 DIAGNOSIS — I10 Essential (primary) hypertension: Secondary | ICD-10-CM | POA: Diagnosis present

## 2023-03-25 DIAGNOSIS — F200 Paranoid schizophrenia: Secondary | ICD-10-CM | POA: Diagnosis not present

## 2023-03-25 DIAGNOSIS — J4489 Other specified chronic obstructive pulmonary disease: Secondary | ICD-10-CM | POA: Diagnosis present

## 2023-03-25 DIAGNOSIS — Z79899 Other long term (current) drug therapy: Secondary | ICD-10-CM | POA: Diagnosis not present

## 2023-03-25 DIAGNOSIS — G4700 Insomnia, unspecified: Secondary | ICD-10-CM | POA: Diagnosis present

## 2023-03-25 DIAGNOSIS — Z8673 Personal history of transient ischemic attack (TIA), and cerebral infarction without residual deficits: Secondary | ICD-10-CM

## 2023-03-25 DIAGNOSIS — F201 Disorganized schizophrenia: Secondary | ICD-10-CM | POA: Diagnosis not present

## 2023-03-25 DIAGNOSIS — F203 Undifferentiated schizophrenia: Secondary | ICD-10-CM | POA: Diagnosis present

## 2023-03-25 DIAGNOSIS — F209 Schizophrenia, unspecified: Principal | ICD-10-CM | POA: Diagnosis present

## 2023-03-25 DIAGNOSIS — Z7982 Long term (current) use of aspirin: Secondary | ICD-10-CM

## 2023-03-25 MED ORDER — FLUTICASONE FUROATE-VILANTEROL 100-25 MCG/ACT IN AEPB
1.0000 | INHALATION_SPRAY | Freq: Every day | RESPIRATORY_TRACT | Status: DC
  Administered 2023-03-26 – 2023-04-07 (×13): 1 via RESPIRATORY_TRACT
  Filled 2023-03-25: qty 28

## 2023-03-25 MED ORDER — MONTELUKAST SODIUM 10 MG PO TABS
10.0000 mg | ORAL_TABLET | Freq: Every day | ORAL | Status: DC
  Administered 2023-03-25 – 2023-04-06 (×13): 10 mg via ORAL
  Filled 2023-03-25 (×13): qty 1

## 2023-03-25 MED ORDER — DIPHENHYDRAMINE HCL 50 MG/ML IJ SOLN: 50.0000 mg | Freq: Three times a day (TID) | INTRAMUSCULAR | Status: DC | PRN

## 2023-03-25 MED ORDER — ASPIRIN 81 MG PO TBEC
81.0000 mg | DELAYED_RELEASE_TABLET | Freq: Every day | ORAL | Status: DC
  Administered 2023-03-25 – 2023-04-07 (×14): 81 mg via ORAL
  Filled 2023-03-25 (×15): qty 1

## 2023-03-25 MED ORDER — FAMOTIDINE 20 MG PO TABS
20.0000 mg | ORAL_TABLET | Freq: Every day | ORAL | Status: DC
  Administered 2023-03-26 – 2023-04-07 (×13): 20 mg via ORAL
  Filled 2023-03-25 (×14): qty 1

## 2023-03-25 MED ORDER — ACETAMINOPHEN 325 MG PO TABS: 650.0000 mg | ORAL_TABLET | Freq: Four times a day (QID) | ORAL | Status: DC | PRN

## 2023-03-25 MED ORDER — MAGNESIUM HYDROXIDE 400 MG/5ML PO SUSP: 30.0000 mL | Freq: Every day | ORAL | Status: DC | PRN

## 2023-03-25 MED ORDER — DIPHENHYDRAMINE HCL 25 MG PO CAPS
50.0000 mg | ORAL_CAPSULE | Freq: Three times a day (TID) | ORAL | Status: DC | PRN
  Administered 2023-03-25: 50 mg via ORAL
  Filled 2023-03-25: qty 2

## 2023-03-25 MED ORDER — LORAZEPAM 2 MG/ML IJ SOLN: 2.0000 mg | Freq: Three times a day (TID) | INTRAMUSCULAR | Status: DC | PRN

## 2023-03-25 MED ORDER — NICOTINE 14 MG/24HR TD PT24
14.0000 mg | MEDICATED_PATCH | Freq: Every day | TRANSDERMAL | Status: DC
  Administered 2023-03-26 – 2023-04-07 (×13): 14 mg via TRANSDERMAL
  Filled 2023-03-25 (×13): qty 1

## 2023-03-25 MED ORDER — DILTIAZEM HCL ER COATED BEADS 180 MG PO CP24
180.0000 mg | ORAL_CAPSULE | Freq: Every day | ORAL | Status: DC
  Administered 2023-03-25 – 2023-04-05 (×12): 180 mg via ORAL
  Filled 2023-03-25 (×13): qty 1

## 2023-03-25 MED ORDER — CLOPIDOGREL BISULFATE 75 MG PO TABS
75.0000 mg | ORAL_TABLET | Freq: Every day | ORAL | Status: DC
  Administered 2023-03-25 – 2023-04-07 (×14): 75 mg via ORAL
  Filled 2023-03-25 (×15): qty 1

## 2023-03-25 MED ORDER — LORAZEPAM 1 MG PO TABS
2.0000 mg | ORAL_TABLET | Freq: Three times a day (TID) | ORAL | Status: DC | PRN
  Administered 2023-03-25 – 2023-03-27 (×2): 2 mg via ORAL
  Filled 2023-03-25 (×2): qty 2

## 2023-03-25 MED ORDER — ALUM & MAG HYDROXIDE-SIMETH 200-200-20 MG/5ML PO SUSP: 30.0000 mL | ORAL | Status: DC | PRN

## 2023-03-25 MED ORDER — IPRATROPIUM-ALBUTEROL 0.5-2.5 (3) MG/3ML IN SOLN
3.0000 mL | Freq: Four times a day (QID) | RESPIRATORY_TRACT | Status: DC | PRN
  Administered 2023-03-28: 3 mL via RESPIRATORY_TRACT
  Filled 2023-03-25: qty 3

## 2023-03-25 MED ORDER — HYDROXYZINE HCL 25 MG PO TABS
25.0000 mg | ORAL_TABLET | Freq: Three times a day (TID) | ORAL | Status: DC | PRN
  Administered 2023-03-25 – 2023-03-27 (×3): 25 mg via ORAL
  Filled 2023-03-25 (×3): qty 1

## 2023-03-25 MED ORDER — HALOPERIDOL LACTATE 5 MG/ML IJ SOLN: 5.0000 mg | Freq: Three times a day (TID) | INTRAMUSCULAR | Status: DC | PRN

## 2023-03-25 MED ORDER — HALOPERIDOL 5 MG PO TABS
5.0000 mg | ORAL_TABLET | Freq: Three times a day (TID) | ORAL | Status: DC | PRN
  Administered 2023-03-25: 5 mg via ORAL
  Filled 2023-03-25: qty 1

## 2023-03-25 MED ORDER — MIRTAZAPINE 15 MG PO TABS
30.0000 mg | ORAL_TABLET | Freq: Every day | ORAL | Status: DC
  Administered 2023-03-25 – 2023-04-06 (×13): 30 mg via ORAL
  Filled 2023-03-25 (×14): qty 2

## 2023-03-25 MED ORDER — TRAZODONE HCL 50 MG PO TABS
50.0000 mg | ORAL_TABLET | Freq: Every evening | ORAL | Status: DC | PRN
  Administered 2023-03-25 – 2023-03-27 (×3): 50 mg via ORAL
  Filled 2023-03-25 (×3): qty 1

## 2023-03-25 NOTE — BHH Suicide Risk Assessment (Signed)
[facility] Admission Suicide Risk Assessment   Nursing information obtained from:    Demographic factors:    Current Mental Status:    Loss Factors:    Historical Factors:    Risk Reduction Factors:     Principal Problem: Schizophrenia (HCC) Diagnosis:  Principal Problem:   Schizophrenia (HCC)  Subjective Data: Patient presented to the  emergency department secondary to 4 days of insomnia now with auditory hallucinations. Has a patient has already been on significant amounts of sleep aids such as melatonin, trazodone, Remeron, and Seroquel with no effect.   Continued Clinical Symptoms:    The "Alcohol Use Disorders Identification Test", Guidelines for Use in Primary Care, Second Edition.  World Science writer [facility]). Score between 0-7:  no or low risk or alcohol related problems. Score between 8-15:  moderate risk of alcohol related problems. Score between 16-19:  high risk of alcohol related problems. Score 20 or above:  warrants further diagnostic evaluation for alcohol dependence and treatment.   CLINICAL FACTORS:   Schizophrenia:   Paranoid or undifferentiated type Previous Psychiatric Diagnoses and Treatments   Musculoskeletal: Strength & Muscle Tone: within normal limits Gait & Station: normal Patient leans: N/A  Psychiatric Specialty Exam:  Presentation  General Appearance:  Appropriate for Environment  Eye Contact: Fair  Speech: Clear and Coherent  Speech Volume: Normal  Handedness:No data recorded  Mood and Affect  Mood: Anxious; Depressed  Affect: Constricted   Thought Process  Thought Processes: Coherent; Goal Directed  Descriptions of Associations:Intact  Orientation:Full (Time, Place and Person)  Thought Content:Abstract Reasoning  History of Schizophrenia/Schizoaffective disorder:Yes  Duration of Psychotic Symptoms:Greater than six months  Hallucinations:Hallucinations: Auditory; Command Description of Command Hallucinations:  Asking patient to kill himself  Ideas of Reference:None  Suicidal Thoughts:Suicidal Thoughts: No  Homicidal Thoughts:Homicidal Thoughts: No   Sensorium  Memory: Immediate Fair  Judgment: Fair  Insight: Fair   Art therapist  Concentration: Fair  Attention Span: Fair  Language: Fair   Psychomotor Activity  Psychomotor Activity: Psychomotor Activity: Decreased; Psychomotor Retardation   Assets  Assets: Communication Skills; Desire for Improvement; Housing   Sleep  Sleep: Sleep: Poor    Physical Exam: Physical Exam Constitutional:      Appearance: Normal appearance.  HENT:     Head: Normocephalic and atraumatic.     Nose: No congestion.  Eyes:     Conjunctiva/sclera: Conjunctivae normal.  Cardiovascular:     Rate and Rhythm: Normal rate.  Pulmonary:     Effort: Pulmonary effort is normal.  Skin:    General: Skin is warm.  Neurological:     General: No focal deficit present.     Mental Status: He is alert and oriented to person, place, and time.    Review of Systems  Constitutional:  Positive for malaise/fatigue. Negative for chills and fever.  HENT:  Negative for congestion and sore throat.   Eyes:  Negative for blurred vision.  Respiratory:  Positive for shortness of breath.   Cardiovascular:  Negative for chest pain and palpitations.  Gastrointestinal:  Negative for nausea and vomiting.  Musculoskeletal:  Positive for joint pain.  Neurological:  Negative for dizziness and speech change.  Psychiatric/Behavioral:  Positive for depression and hallucinations. Negative for suicidal ideas. The patient has insomnia.    Blood pressure (!) 124/93, pulse 97, temperature 97.9 F (36.6 C), height 6\' 1"  (1.854 m), weight 98.4 kg, SpO2 98%. Body mass index is 28.63 kg/m.   COGNITIVE FEATURES THAT CONTRIBUTE TO RISK:  Thought constriction (  tunnel vision)    SUICIDE RISK:   Minimal: No identifiable suicidal ideation.    PLAN OF CARE: Per  H&P  I certify that inpatient services furnished can reasonably be expected to improve the patient's condition.   Lewanda Rife, MD

## 2023-03-25 NOTE — Progress Notes (Signed)
Patient is pleasant and cooperative.  Continues to endorse insomnia for the past 4 days.  Endorses AH.  Denies SI/HI.  Flat affect.  Denies pain.   Compliant with scheduled medications.  Nicotine patch will be administered  tomorrow morning. 15 min safety checks in place for safety.  Patient is present in the milieu watching TV.   Minimal interaction with peers and staff.

## 2023-03-25 NOTE — Progress Notes (Signed)
Patient newly admitted under voluntarily committment from the [facility] ED due to reports of hearing voices to kill himself, he also reports that his anxiety is a 6 and his depression is a 7. He reports that he hasn't slept in over seven days. He is currently living in a group home facility. He is Ox 5, cooperative with treatment thus far.

## 2023-03-25 NOTE — H&P (Signed)
Psychiatric Admission Assessment Adult  Patient Identification: [name] MRN:  [id number] Date of Evaluation:  03/25/2023 Chief Complaint:  Schizophrenia (HCC) [F20.9] Principal Diagnosis: Schizophrenia (HCC) Diagnosis:  Principal Problem:   Schizophrenia (HCC)  History of Present Illness: Patient presented to the  emergency department secondary to 4 days of insomnia now with auditory hallucinations. Has a patient has already been on significant amounts of sleep aids such as melatonin, trazodone, Remeron, and Seroquel with no effect.   Patient's is seen today for evaluation.  He endorses depressed mood.  Patient states that he feels tired because he has not slept in the past 4 to 5 days.  Patient said that his medicines including melatonin, trazodone, Remeron, and Seroquel has not helped with sleep.  Patient reports history of schizophrenia.  He reports he has been hearing voices telling him to harm himself.  He said that the voices are getting louder.  Patient denies any intention to harm himself or others.  Patient also shared that he has been diagnosed with lung carcinoma in the past.  He said he was in remission but" the cancer is back".  Patient is not sure when he is going to start chemotherapy.  He denies visual hallucinations today.  Patient was provided with support and reassurance.  Past Psychiatric History: Patient reports history of schizophrenia  Is the patient at risk to self? No.  Has the patient been a risk to self in the past 6 months? No.  Has the patient been a risk to self within the distant past? No.  Is the patient a risk to others? No.  Has the patient been a risk to others in the past 6 months? No.  Has the patient been a risk to others within the distant past? No.   [place] Scale:  Flowsheet Row ED from 03/24/2023 in [facility] Emergency Department at [facility] Admission (Discharged) from 03/15/2023 in [facility]  Pre-Admission Testing 45 from 03/07/2023 in [facility] PRE ADMISSION TESTING  C-SSRS RISK CATEGORY No Risk No Risk No Risk        Prior Inpatient Therapy: Yes.     Prior Outpatient Therapy: Yes.      Alcohol Screening: Patient refused Alcohol Screening Tool: Yes Substance Abuse History in the last 12 months:  Patient denies current use of alcohol or illicit drug.  He is reports he smokes couple of cigarettes per day.  Previous Psychotropic Medications: Yes  Psychological Evaluations: Yes  Past Medical History:  Past Medical History:  Diagnosis Date   Acute on chronic respiratory failure with hypoxia (HCC)    Asthma    Coagulation disorder (HCC)    COPD (chronic obstructive pulmonary disease) (HCC)    Depression    History of hiatal hernia    Hypertension    Hypokalemia    Leukocytosis    Lung mass    Pneumonia    Schizophrenia (HCC)    Shortness of breath dyspnea    Stroke [facility])    Umbilical hernia    Ventral hernia     Past Surgical History:  Procedure Laterality Date   COLONOSCOPY WITH PROPOFOL N/A 09/21/2021   Procedure: COLONOSCOPY WITH PROPOFOL;  Surgeon: Regis Bill, MD;  Location: [facility] ENDOSCOPY;  Service: Endoscopy;  Laterality: N/A;   HERNIA REPAIR     INSERTION OF MESH N/A 12/18/2014   Procedure: INSERTION OF MESH;  Surgeon: Lattie Haw, MD;  Location: [facility] ORS;  Service: General;  Laterality: N/A;   SUPRA-UMBILICAL HERNIA  12/18/2014   Procedure: SUPRA-UMBILICAL HERNIA;  Surgeon: Lattie Haw, MD;  Location: [facility] ORS;  Service: General;;   UMBILICAL HERNIA REPAIR N/A 12/18/2014   Procedure: HERNIA REPAIR UMBILICAL ADULT;  Surgeon: Lattie Haw, MD;  Location: [facility] ORS;  Service: General;  Laterality: N/A;   VIDEO BRONCHOSCOPY WITH ENDOBRONCHIAL ULTRASOUND N/A 03/15/2023   Procedure: VIDEO BRONCHOSCOPY WITH ENDOBRONCHIAL ULTRASOUND;  Surgeon: Vida Rigger, MD;  Location: [facility] ORS;  Service: Thoracic;  Laterality: N/A;    Family History:  Family History  Problem Relation Age of Onset   Asthma Mother    Hypertension Father     Tobacco Screening:  Social History   Tobacco Use  Smoking Status Every Day   Current packs/day: 0.15   Average packs/day: 0.2 packs/day for 45.0 years (6.8 ttl pk-yrs)   Types: Cigarettes  Smokeless Tobacco Never    BH Tobacco Counseling     Are you interested in Tobacco Cessation Medications?  No value filed. Counseled patient on smoking cessation:  No value filed. Reason Tobacco Screening Not Completed: No value filed.       Social History:  Social History   Substance and Sexual Activity  Alcohol Use Not Currently   Alcohol/week: 75.0 standard drinks of alcohol   Types: 75 Cans of beer per week     Social History   Substance and Sexual Activity  Drug Use Not Currently   Types: Marijuana, "Crack" cocaine   Comment: none in 15 yrs    Additional Social History:       Patient reports he lives in a group home                   Allergies:  No Known Allergies Lab Results:  Results for orders placed or performed during the hospital encounter of 03/24/23 (from the past 48 hour(s))  CBC     Status: None   Collection Time: 03/24/23  8:32 AM  Result Value Ref Range   WBC 7.8 4.0 - 10.5 K/uL   RBC 4.39 4.22 - 5.81 MIL/uL   Hemoglobin 13.0 13.0 - 17.0 g/dL   HCT 16.1 09.6 - 04.5 %   MCV 89.7 80.0 - 100.0 fL   MCH 29.6 26.0 - 34.0 pg   MCHC 33.0 30.0 - 36.0 g/dL   RDW 40.9 81.1 - 91.4 %   Platelets 187 150 - 400 K/uL   nRBC 0.0 0.0 - 0.2 %    Comment: Performed at [facility], [address] [place], [place] [address]  Comprehensive metabolic panel     Status: Abnormal   Collection Time: 03/24/23  8:32 AM  Result Value Ref Range   Sodium 138 135 - 145 mmol/L   Potassium 3.4 (L) 3.5 - 5.1 mmol/L   Chloride 105 98 - 111 mmol/L   CO2 25 22 - 32 mmol/L   Glucose, Bld 105 (H) 70 - 99 mg/dL    Comment: Glucose reference range applies  only to samples taken after fasting for at least 8 hours.   BUN 9 8 - 23 mg/dL   Creatinine, Ser 6.21 0.61 - 1.24 mg/dL   Calcium 9.2 8.9 - 30.8 mg/dL   Total Protein 7.0 6.5 - 8.1 g/dL   Albumin 3.5 3.5 - 5.0 g/dL   AST 18 15 - 41 U/L   ALT 21 0 - 44 U/L   Alkaline Phosphatase 70 38 - 126 U/L   Total Bilirubin 0.7 0.3 -  1.2 mg/dL   GFR, Estimated >40 >98 mL/min    Comment: (NOTE) Calculated using the CKD-EPI Creatinine Equation (2021)    Anion gap 8 5 - 15    Comment: Performed at [facility], [address] [place], [place] [address]  Ethanol     Status: None   Collection Time: 03/24/23  8:32 AM  Result Value Ref Range   Alcohol, Ethyl (B) <10 <10 mg/dL    Comment: (NOTE) Lowest detectable limit for serum alcohol is 10 mg/dL.  For medical purposes only. Performed at [facility] [address] [place], [place] [address]   Acetaminophen level     Status: Abnormal   Collection Time: 03/24/23  8:32 AM  Result Value Ref Range   Acetaminophen (Tylenol), Serum <10 (L) 10 - 30 ug/mL    Comment: (NOTE) Therapeutic concentrations vary significantly. A range of 10-30 ug/mL  may be an effective concentration for many patients. However, some  are best treated at concentrations outside of this range. Acetaminophen concentrations >150 ug/mL at 4 hours after ingestion  and >50 ug/mL at 12 hours after ingestion are often associated with  toxic reactions.  Performed at [facility], [address] [place], [place] [address]   Salicylate level     Status: Abnormal   Collection Time: 03/24/23  8:32 AM  Result Value Ref Range   Salicylate Lvl <7.0 (L) 7.0 - 30.0 mg/dL    Comment: Performed at [facility], [address] [place], [place] [address]    Blood Alcohol level:  Lab Results  Component Value Date   ETH <10 03/24/2023    Metabolic Disorder Labs:  Lab Results  Component Value Date   HGBA1C 6.5 (H) 11/04/2011   No results  found for: "PROLACTIN" No results found for: "CHOL", "TRIG", "HDL", "CHOLHDL", "VLDL", "LDLCALC"  Current Medications: Current Facility-Administered Medications  Medication Dose Route Frequency Provider Last Rate Last Admin   acetaminophen (TYLENOL) tablet 650 mg  650 mg Oral Q6H PRN Ajibola, Ene A, NP       alum & mag hydroxide-simeth (MAALOX/MYLANTA) 200-200-20 MG/5ML suspension 30 mL  30 mL Oral Q4H PRN Ajibola, Ene A, NP       aspirin EC tablet 81 mg  81 mg Oral Daily Lewanda Rife, MD       clopidogrel (PLAVIX) tablet 75 mg  75 mg Oral Daily Lewanda Rife, MD       diltiazem (CARDIZEM CD) 24 hr capsule 180 mg  180 mg Oral Daily Lewanda Rife, MD       diphenhydrAMINE (BENADRYL) capsule 50 mg  50 mg Oral TID PRN Ajibola, Ene A, NP       Or   diphenhydrAMINE (BENADRYL) injection 50 mg  50 mg Intramuscular TID PRN Ajibola, Ene A, NP       famotidine (PEPCID) tablet 20 mg  20 mg Oral Daily Marval Regal, Jerita Wimbush, MD       fluticasone furoate-vilanterol (BREO ELLIPTA) 100-25 MCG/ACT 1 puff  1 puff Inhalation Daily Thurston Brendlinger, MD       haloperidol (HALDOL) tablet 5 mg  5 mg Oral TID PRN Ajibola, Ene A, NP       Or   haloperidol lactate (HALDOL) injection 5 mg  5 mg Intramuscular TID PRN Ajibola, Ene A, NP       hydrOXYzine (ATARAX) tablet 25 mg  25 mg Oral TID PRN Ajibola, Ene A, NP       ipratropium-albuterol (DUONEB) 0.5-2.5 (3) MG/3ML  nebulizer solution 3 mL  3 mL Nebulization Q6H PRN Lewanda Rife, MD       LORazepam (ATIVAN) tablet 2 mg  2 mg Oral TID PRN Ajibola, Ene A, NP       Or   LORazepam (ATIVAN) injection 2 mg  2 mg Intramuscular TID PRN Ajibola, Ene A, NP       magnesium hydroxide (MILK OF MAGNESIA) suspension 30 mL  30 mL Oral Daily PRN Ajibola, Ene A, NP       mirtazapine (REMERON) tablet 30 mg  30 mg Oral QHS Lewanda Rife, MD       montelukast (SINGULAIR) tablet 10 mg  10 mg Oral QHS Lewanda Rife, MD       traZODone (DESYREL) tablet 50 mg  50 mg  Oral QHS PRN Ajibola, Ene A, NP       PTA Medications: Medications Prior to Admission  Medication Sig Dispense Refill Last Dose   acetaminophen (TYLENOL) 325 MG tablet Take 650 mg by mouth 2 (two) times daily as needed for mild pain.      albuterol (VENTOLIN HFA) 108 (90 Base) MCG/ACT inhaler Inhale 2 puffs into the lungs every 4 (four) hours as needed for wheezing or shortness of breath.      amantadine (SYMMETREL) 100 MG capsule Take 100 mg by mouth 2 (two) times daily.      aspirin EC 81 MG tablet Take 81 mg by mouth daily. Swallow whole.      clopidogrel (PLAVIX) 75 MG tablet Take 75 mg by mouth daily.      dextromethorphan-guaiFENesin (ROBITUSSIN-DM) 10-100 MG/5ML liquid Take 10 mLs by mouth every 6 (six) hours as needed for cough. (Patient not taking: Reported on 03/07/2023)      diltiazem (CARDIZEM CD) 180 MG 24 hr capsule Take 180 mg by mouth daily.       docusate sodium (COLACE) 100 MG capsule Take 100 mg by mouth 2 (two) times daily as needed for mild constipation.      famotidine (PEPCID) 20 MG tablet Take 1 tablet (20 mg total) by mouth daily. 30 tablet 0    Fluticasone-Umeclidin-Vilant (TRELEGY ELLIPTA) 100-62.5-25 MCG/ACT AEPB Inhale 1 puff into the lungs daily. 28 each 1    gabapentin (NEURONTIN) 100 MG capsule Take 100 mg by mouth at bedtime.       guaiFENesin (MUCINEX) 600 MG 12 hr tablet Take 600 mg by mouth 2 (two) times daily as needed.      hydrochlorothiazide (HYDRODIURIL) 12.5 MG tablet Take 12.5 mg by mouth daily.      INVEGA TRINZA 819 MG/2.625ML SUSY Inject 819 mg into the muscle every 3 (three) months.       ipratropium-albuterol (DUONEB) 0.5-2.5 (3) MG/3ML SOLN Take 3 mLs by nebulization every 6 (six) hours as needed (every 4 to 6 hours as needed for shortnes of breath or wheeziing).       ketoconazole (NIZORAL) 2 % cream Apply to both feet and between toes once daily for 6 weeks. 60 g 1    lovastatin (MEVACOR) 20 MG tablet Take 20 mg by mouth at bedtime.       Melatonin 5 MG TABS Take 10 mg by mouth at bedtime.      mirtazapine (REMERON) 30 MG tablet Take 30 mg by mouth at bedtime.      montelukast (SINGULAIR) 10 MG tablet Take 1 tablet (10 mg total) by mouth at bedtime. 30 tablet 1    Multiple Vitamin (THEREMS) TABS Take 1 tablet  by mouth daily. (Patient not taking: Reported on 03/24/2023)      Multiple Vitamins-Minerals (MULTIVITAMIN WITH MINERALS) tablet Take 1 tablet by mouth daily.      nicotine (NICODERM CQ - DOSED IN MG/24 HOURS) 14 mg/24hr patch Place 1 patch (14 mg total) onto the skin daily. Do not use if actively smoking cigarettes. (Patient not taking: Reported on 03/24/2023) 28 patch 0    Omega-3 Fatty Acids (FISH OIL) 1000 MG CPDR Take 1,000 mg by mouth at bedtime.      Pyridoxine HCl (B-6) 100 MG TABS Take 1 tablet by mouth daily.      QUEtiapine (SEROQUEL) 200 MG tablet Take 200 mg by mouth at bedtime. (Patient not taking: Reported on 03/07/2023)      traZODone (DESYREL) 100 MG tablet Take 100 mg by mouth at bedtime.      VITAMIN D, ERGOCALCIFEROL, PO Take 1,000 Units by mouth daily.        Musculoskeletal: Strength & Muscle Tone: within normal limits Gait & Station: normal Patient leans: N/A   Psychiatric Specialty Exam:   Presentation  General Appearance:  Appropriate for Environment   Eye Contact: Fair   Speech: Clear and Coherent   Speech Volume: Normal   Handedness:No data recorded   Mood and Affect  Mood: Anxious; Depressed   Affect: Constricted     Thought Process  Thought Processes: Coherent; Goal Directed   Descriptions of Associations:Intact   Orientation:Full (Time, Place and Person)   Thought Content:Abstract Reasoning   History of Schizophrenia/Schizoaffective disorder:Yes   Duration of Psychotic Symptoms:Greater than six months   Hallucinations:Hallucinations: Auditory; Command Description of Command Hallucinations: Asking patient to kill himself   Ideas of Reference:None   Suicidal  Thoughts:Suicidal Thoughts: No   Homicidal Thoughts:Homicidal Thoughts: No     Sensorium  Memory: Immediate Fair   Judgment: Fair   Insight: Fair     Art therapist  Concentration: Fair   Attention Span: Fair   Language: Fair     Psychomotor Activity  Psychomotor Activity: Psychomotor Activity: Decreased; Psychomotor Retardation     Assets  Assets: Communication Skills; Desire for Improvement; Housing     Sleep  Sleep: Sleep: Poor       Physical Exam: Physical Exam Constitutional:      Appearance: Normal appearance.  HENT:     Head: Normocephalic and atraumatic.     Nose: No congestion.  Eyes:     Conjunctiva/sclera: Conjunctivae normal.  Cardiovascular:     Rate and Rhythm: Normal rate.  Pulmonary:     Effort: Pulmonary effort is normal.  Skin:    General: Skin is warm.  Neurological:     General: No focal deficit present.     Mental Status: He is alert and oriented to person, place, and time.      Review of Systems  Constitutional:  Positive for malaise/fatigue. Negative for chills and fever.  HENT:  Negative for congestion and sore throat.   Eyes:  Negative for blurred vision.  Respiratory:  Positive for shortness of breath.   Cardiovascular:  Negative for chest pain and palpitations.  Gastrointestinal:  Negative for nausea and vomiting.  Musculoskeletal:  Positive for joint pain.  Neurological:  Negative for dizziness and speech change.  Psychiatric/Behavioral:  Positive for depression and hallucinations. Negative for suicidal ideas. The patient has insomnia.    Blood pressure (!) 124/93, pulse 97, temperature 97.9 F (36.6 C), height 6\' 1"  (1.854 m), weight 98.4 kg, SpO2 98%. Body mass index  is 28.63 kg/m.  Treatment Plan Summary: Daily contact with patient to assess and evaluate symptoms and progress in treatment and Medication management  Observation Level/Precautions:  15 minute checks  Laboratory:  HbA1C and Lipid profile   Psychotherapy:    Medications:  Per MAR  Consultations:    Discharge Concerns:    Estimated LOS:  Other:     Physician Treatment Plan for Primary Diagnosis: Schizophrenia (HCC) Long Term Goal(s): Improvement in symptoms so as ready for discharge  Short Term Goals: Ability to identify changes in lifestyle to reduce recurrence of condition will improve, Ability to verbalize feelings will improve, Ability to disclose and discuss suicidal ideas, Ability to demonstrate self-control will improve, Ability to identify and develop effective coping behaviors will improve, Ability to maintain clinical measurements within normal limits will improve, and Ability to identify triggers associated with substance abuse/mental health issues will improve   I certify that inpatient services furnished can reasonably be expected to improve the patient's condition.    Patient is admitted to locked unit under safety precaution Patient has been started on home medications including aspirin, Plavix,  Cardizem, mirtazapine 3.  Will start on Invega 3 mg at bedtime to help with auditory hallucinations 4.  Will consult social worker to get collateral and help with a safe discharge plan 5.  Hemoglobin A1c and lipid profile ordered   Lewanda Rife, MD

## 2023-03-25 NOTE — Plan of Care (Signed)
  Problem: Pain Managment: Goal: General experience of comfort will improve Outcome: Progressing   Problem: Safety: Goal: Ability to remain free from injury will improve Outcome: Progressing   Problem: Skin Integrity: Goal: Risk for impaired skin integrity will decrease Outcome: Progressing   

## 2023-03-25 NOTE — Tx Team (Signed)
Initial Treatment Plan 03/25/2023 6:33 AM [name] [id number]    PATIENT STRESSORS: Financial difficulties   Health problems   Marital or family conflict     PATIENT STRENGTHS: Ability for insight  Communication skills  Supportive family/friends    PATIENT IDENTIFIED PROBLEMS: Depression  Anxiety  Auditory Hallucinations                 DISCHARGE CRITERIA:  Improved stabilization in mood, thinking, and/or behavior Verbal commitment to aftercare and medication compliance  PRELIMINARY DISCHARGE PLAN: Outpatient therapy Return to previous work or school arrangements  PATIENT/FAMILY INVOLVEMENT: This treatment plan has been presented to and reviewed with the patient, [name]. The patient and family have been given the opportunity to ask questions and make suggestions.  Foster Simpson, RN 03/25/2023, 6:33 AM

## 2023-03-25 NOTE — Plan of Care (Signed)
Patient new to the unit haven't time to progress.   Problem: Coping: Goal: Level of anxiety will decrease Outcome: Progressing   Problem: Elimination: Goal: Will not experience complications related to bowel motility Outcome: Progressing Goal: Will not experience complications related to urinary retention Outcome: Progressing   Problem: Pain Managment: Goal: General experience of comfort will improve Outcome: Progressing   Problem: Safety: Goal: Ability to remain free from injury will improve Outcome: Progressing   Problem: Skin Integrity: Goal: Risk for impaired skin integrity will decrease Outcome: Progressing

## 2023-03-25 NOTE — Group Note (Signed)
Date:  03/25/2023 Time:  10:41 AM  Group Topic/Focus:  OUTDOOR RECREATION  AND RAPPORT BUILDING    Participation Level:  Did Not Attend   [name] 03/25/2023, 10:41 AM

## 2023-03-25 NOTE — ED Notes (Signed)
Pt dressed down to go to inpatient psych at this time. Belongings placed in bag and labeled with patient label

## 2023-03-26 DIAGNOSIS — F200 Paranoid schizophrenia: Secondary | ICD-10-CM | POA: Diagnosis not present

## 2023-03-26 LAB — HEMOGLOBIN A1C
Hgb A1c MFr Bld: 5.8 % — ABNORMAL HIGH (ref 4.8–5.6)
Mean Plasma Glucose: 119.76 mg/dL

## 2023-03-26 LAB — LIPID PANEL
Cholesterol: 108 mg/dL (ref 0–200)
HDL: 43 mg/dL (ref >40)
LDL Cholesterol: 51 mg/dL (ref 0–99)
Total CHOL/HDL Ratio: 2.5 ratio
Triglycerides: 72 mg/dL (ref <150)
VLDL: 14 mg/dL (ref 0–40)

## 2023-03-26 MED ORDER — PALIPERIDONE ER 3 MG PO TB24
6.0000 mg | ORAL_TABLET | Freq: Every day | ORAL | Status: DC
  Administered 2023-03-26: 6 mg via ORAL
  Filled 2023-03-26: qty 2

## 2023-03-26 NOTE — [facility] Counselor (Signed)
Adult Comprehensive Assessment  Patient ID: [name], male   DOB: August 24, 1960, 62 y.o.   MRN: [id number]  Information Source: Information source: Patient  Current Stressors:  Patient states their primary concerns and needs for treatment are:: The patient stated that he was hearing voices and had not slept in 7 days. Patient states their goals for this hospitilization and ongoing recovery are:: The patient stated to get out. Educational / Learning stressors: The patient stated none. Employment / Job issues: The patient stated none. Family Relationships: The patient stated none. Financial / Lack of resources (include bankruptcy): The patient stated none. Housing / Lack of housing: The patient stated none. Physical health (include injuries & life threatening diseases): The patient stated he has lung cancer. Social relationships: The patient stated none. Substance abuse: The patient stated none. Bereavement / Loss: The patient stated that he loss his sister last November.  Living/Environment/Situation:  Living Arrangements: Group Home Living conditions (as described by patient or guardian): The patient stated that the conditions were good. How long has patient lived in current situation?: The patient stated 2 years. What is atmosphere in current home: Comfortable  Family History:  Marital status: Divorced Divorced, when?: The patient stated that he has been divorced since the 2000's. Does patient have children?: Yes How many children?: 3 How is patient's relationship with their children?: The patient stated thst he has a good relatiohsip with his kids.  Childhood History:  By whom was/is the patient raised?: Mother, Mother/father and step-parent Description of patient's relationship with caregiver when they were a child: The patient stated that he had a good relationship with his mom and stepfather. Patient's description of current relationship with people who raised him/her: The  patient stated that they both were deceased. How were you disciplined when you got in trouble as a child/adolescent?: The patient stated switch whoopings. Does patient have siblings?: Yes Number of Siblings: 5 Description of patient's current relationship with siblings: The patient stated good. Did patient suffer any verbal/emotional/physical/sexual abuse as a child?: No Did patient suffer from severe childhood neglect?: No Has patient ever been sexually abused/assaulted/raped as an adolescent or adult?: No Was the patient ever a victim of a crime or a disaster?: No Witnessed domestic violence?: No Has patient been affected by domestic violence as an adult?: No  Education:  Highest grade of school patient has completed: the patient stated 10th. Currently a student?: No Learning disability?: Yes What learning problems does patient have?: The patient stated Schizophrenia.  Employment/Work Situation:   Employment Situation: On disability Why is Patient on Disability: The patient stated mental health. How Long has Patient Been on Disability: The patient stated a long time. Patient's Job has Been Impacted by Current Illness: No Has Patient ever Been in the [employer]?: No  Financial Resources:   Financial resources: [employer], Medicare, Medicaid Does patient have a representative payee or guardian?: No  Alcohol/Substance Abuse:   What has been your use of drugs/alcohol within the last 12 months?: The patient stated none. If attempted suicide, did drugs/alcohol play a role in this?: No Alcohol/Substance Abuse Treatment Hx: Denies past history Has alcohol/substance abuse ever caused legal problems?: No  Social Support System:   Patient's Community Support System: Good Describe Community Support System: The patient stated that he has a good support system.  Leisure/Recreation:   Do You Have Hobbies?: No  Strengths/Needs:   Patient states these barriers may affect/interfere with  their treatment: The patient stated none. Patient states these  barriers may affect their return to the community: The patient stated none. Other important information patient would like considered in planning for their treatment: The patient stated none.  Discharge Plan:   Currently receiving community mental health services: No Patient states concerns and preferences for aftercare planning are: The patient stated none. Patient states they will know when they are safe and ready for discharge when: The patient stated he is ready. Does patient have access to transportation?: Yes (The patient stated that the group home would provide.) Does patient have financial barriers related to discharge medications?: No Patient description of barriers related to discharge medications: The patient stated none. Will patient be returning to same living situation after discharge?: Yes  Summary/Recommendations:   Summary and Recommendations (to be completed by the evaluator): The patient is a 62 year old African American male from [place] [facility]) who presents to the ER due to the symptoms of his mental illness increasing. Patient believes his medications are no longer working for him. He also reports he hasn't slept in 7 days. The patient stated that he was schizophrenic and has been hearing voices. During the interview, the patient was calm, cooperative and pleasant. The patient answered no or none for most questions. The patient denies substance and alcohol use. The patient denies past treatment. The patient stated that he does not receive any mental health services and did not want to set any up. The patient stated that he receives disability and Medicaid.  Recommendations include crisis stabilization, therapeutic milieu, encourage group attendance and participation, medication management for mood stabilization, and development of a comprehensive mental wellness.  Marshell Levan. 03/26/2023

## 2023-03-26 NOTE — Progress Notes (Signed)
[facility] MD Progress Note  03/26/2023  [name]  MRN:  [id number]  Subjective: Chart reviewed, case discussed with staff, patient seen today during rounds.  Patient reports that he feels anxious and continues to feel depressed.  Patient reports that  he got some sleep last night after getting as needed meds.  Patient reports his appetite is fine.  Patient denies visual hallucinations.  Continues to report auditory hallucinations, people asking him to harm himself.  He denies any intention or plan to harm himself on the unit.  Patient was provided with support.  He was encouraged to attend group and work on coping strategies.  Will increase the dose of Invega to 6 mg.  Patient's labs results discussed with patient.  Principal Problem: Schizophrenia (HCC) Diagnosis: Principal Problem:   Schizophrenia (HCC)    Past Psychiatric History: Patient reports history of schizophrenia   Past Medical History:  Past Medical History:  Diagnosis Date   Acute on chronic respiratory failure with hypoxia (HCC)    Asthma    Coagulation disorder (HCC)    COPD (chronic obstructive pulmonary disease) (HCC)    Depression    History of hiatal hernia    Hypertension    Hypokalemia    Leukocytosis    Lung mass    Pneumonia    Schizophrenia (HCC)    Shortness of breath dyspnea    Stroke [facility])    Umbilical hernia    Ventral hernia     Past Surgical History:  Procedure Laterality Date   COLONOSCOPY WITH PROPOFOL N/A 09/21/2021   Procedure: COLONOSCOPY WITH PROPOFOL;  Surgeon: Regis Bill, MD;  Location: [facility] ENDOSCOPY;  Service: Endoscopy;  Laterality: N/A;   HERNIA REPAIR     INSERTION OF MESH N/A 12/18/2014   Procedure: INSERTION OF MESH;  Surgeon: Lattie Haw, MD;  Location: [facility] ORS;  Service: General;  Laterality: N/A;   SUPRA-UMBILICAL HERNIA  12/18/2014   Procedure: SUPRA-UMBILICAL HERNIA;  Surgeon: Lattie Haw, MD;  Location: [facility] ORS;  Service: General;;   UMBILICAL HERNIA REPAIR  N/A 12/18/2014   Procedure: HERNIA REPAIR UMBILICAL ADULT;  Surgeon: Lattie Haw, MD;  Location: [facility] ORS;  Service: General;  Laterality: N/A;   VIDEO BRONCHOSCOPY WITH ENDOBRONCHIAL ULTRASOUND N/A 03/15/2023   Procedure: VIDEO BRONCHOSCOPY WITH ENDOBRONCHIAL ULTRASOUND;  Surgeon: Vida Rigger, MD;  Location: [facility] ORS;  Service: Thoracic;  Laterality: N/A;   Family History:  Family History  Problem Relation Age of Onset   Asthma Mother    Hypertension Father     Social History:  Social History   Substance and Sexual Activity  Alcohol Use Not Currently   Alcohol/week: 75.0 standard drinks of alcohol   Types: 75 Cans of beer per week     Social History   Substance and Sexual Activity  Drug Use Not Currently   Types: Marijuana, "Crack" cocaine   Comment: none in 15 yrs    Social History   Socioeconomic History   Marital status: Widowed    Spouse name: Not on file   Number of children: Not on file   Years of education: Not on file   Highest education level: Not on file  Occupational History   Not on file  Tobacco Use   Smoking status: Every Day    Current packs/day: 0.15    Average packs/day: 0.2 packs/day for 45.0 years (6.8 ttl pk-yrs)    Types: Cigarettes   Smokeless tobacco: Never  Vaping Use   Vaping status:  Never Used  Substance and Sexual Activity   Alcohol use: Not Currently    Alcohol/week: 75.0 standard drinks of alcohol    Types: 75 Cans of beer per week   Drug use: Not Currently    Types: Marijuana, "Crack" cocaine    Comment: none in 15 yrs   Sexual activity: Not Currently  Other Topics Concern   Not on file  Social History Narrative   Not on file   Social Determinants of Health   Financial Resource Strain: Not on file  Food Insecurity: No Food Insecurity (03/25/2023)   Hunger Vital Sign    Worried About Running Out of Food in the Last Year: Never true    Ran Out of Food in the Last Year: Never true  Transportation Needs: No  Transportation Needs (03/25/2023)   PRAPARE - Administrator, Civil Service (Medical): No    Lack of Transportation (Non-Medical): No  Physical Activity: Not on file  Stress: Not on file  Social Connections: Not on file   Additional Social History:                         Sleep: Poor  Appetite:  Fair  Current Medications: Current Facility-Administered Medications  Medication Dose Route Frequency Provider Last Rate Last Admin   acetaminophen (TYLENOL) tablet 650 mg  650 mg Oral Q6H PRN Ajibola, Ene A, NP       alum & mag hydroxide-simeth (MAALOX/MYLANTA) 200-200-20 MG/5ML suspension 30 mL  30 mL Oral Q4H PRN Ajibola, Ene A, NP       aspirin EC tablet 81 mg  81 mg Oral Daily Lewanda Rife, MD   81 mg at 03/26/23 0858   clopidogrel (PLAVIX) tablet 75 mg  75 mg Oral Daily Lewanda Rife, MD   75 mg at 03/26/23 0858   diltiazem (CARDIZEM CD) 24 hr capsule 180 mg  180 mg Oral Daily Lewanda Rife, MD   180 mg at 03/26/23 0858   diphenhydrAMINE (BENADRYL) capsule 50 mg  50 mg Oral TID PRN Ajibola, Ene A, NP   50 mg at 03/25/23 2125   Or   diphenhydrAMINE (BENADRYL) injection 50 mg  50 mg Intramuscular TID PRN Ajibola, Ene A, NP       famotidine (PEPCID) tablet 20 mg  20 mg Oral Daily Marval Regal, Ramell Wacha, MD   20 mg at 03/26/23 0858   fluticasone furoate-vilanterol (BREO ELLIPTA) 100-25 MCG/ACT 1 puff  1 puff Inhalation Daily Lewanda Rife, MD   1 puff at 03/26/23 0858   haloperidol (HALDOL) tablet 5 mg  5 mg Oral TID PRN Ajibola, Ene A, NP   5 mg at 03/25/23 2125   Or   haloperidol lactate (HALDOL) injection 5 mg  5 mg Intramuscular TID PRN Ajibola, Ene A, NP       hydrOXYzine (ATARAX) tablet 25 mg  25 mg Oral TID PRN Ajibola, Ene A, NP   25 mg at 03/25/23 2122   ipratropium-albuterol (DUONEB) 0.5-2.5 (3) MG/3ML nebulizer solution 3 mL  3 mL Nebulization Q6H PRN Lewanda Rife, MD       LORazepam (ATIVAN) tablet 2 mg  2 mg Oral TID PRN Ajibola, Ene A, NP    2 mg at 03/25/23 2125   Or   LORazepam (ATIVAN) injection 2 mg  2 mg Intramuscular TID PRN Ajibola, Ene A, NP       magnesium hydroxide (MILK OF MAGNESIA) suspension 30 mL  30 mL Oral Daily  PRN Ajibola, Ene A, NP       mirtazapine (REMERON) tablet 30 mg  30 mg Oral QHS Lewanda Rife, MD   30 mg at 03/25/23 2122   montelukast (SINGULAIR) tablet 10 mg  10 mg Oral QHS Lewanda Rife, MD   10 mg at 03/25/23 2129   nicotine (NICODERM CQ - dosed in mg/24 hours) patch 14 mg  14 mg Transdermal Daily Lewanda Rife, MD   14 mg at 03/26/23 0858   paliperidone (INVEGA) 24 hr tablet 6 mg  6 mg Oral QHS Lewanda Rife, MD       traZODone (DESYREL) tablet 50 mg  50 mg Oral QHS PRN Ajibola, Ene A, NP   50 mg at 03/25/23 2122    Lab Results:  Results for orders placed or performed during the hospital encounter of 03/25/23 (from the past 48 hour(s))  Hemoglobin A1c     Status: Abnormal   Collection Time: 03/26/23  7:09 AM  Result Value Ref Range   Hgb A1c MFr Bld 5.8 (H) 4.8 - 5.6 %    Comment: (NOTE) Pre diabetes:          5.7%-6.4%  Diabetes:              >6.4%  Glycemic control for   <7.0% adults with diabetes    Mean Plasma Glucose 119.76 mg/dL    Comment: Performed at [facility], [address] [place], [place] [address]  Lipid panel     Status: None   Collection Time: 03/26/23  7:09 AM  Result Value Ref Range   Cholesterol 108 0 - 200 mg/dL   Triglycerides 72 <132 mg/dL   HDL 43 >44 mg/dL   Total CHOL/HDL Ratio 2.5 RATIO   VLDL 14 0 - 40 mg/dL   LDL Cholesterol 51 0 - 99 mg/dL    Comment:        Total Cholesterol/HDL:CHD Risk Coronary Heart Disease Risk Table                     Men   Women  1/2 Average Risk   3.4   3.3  Average Risk       5.0   4.4  2 X Average Risk   9.6   7.1  3 X Average Risk  23.4   11.0        Use the calculated Patient Ratio above and the CHD Risk Table to determine the patient's CHD Risk.        ATP III CLASSIFICATION (LDL):  <100      mg/dL   Optimal  010-272  mg/dL   Near or Above                    Optimal  130-159  mg/dL   Borderline  536-644  mg/dL   High  >034     mg/dL   Very High Performed at [facility], [address] [place], [place] [address]     Blood Alcohol level:  Lab Results  Component Value Date   [facility] <10 03/24/2023    Metabolic Disorder Labs: Lab Results  Component Value Date   HGBA1C 5.8 (H) 03/26/2023   MPG 119.76 03/26/2023   No results found for: "PROLACTIN" Lab Results  Component Value Date   CHOL 108 03/26/2023   TRIG 72 03/26/2023   HDL 43 03/26/2023   CHOLHDL 2.5 03/26/2023   VLDL 14 03/26/2023   LDLCALC 51  03/26/2023    Musculoskeletal: Strength & Muscle Tone: within normal limits Gait & Station: normal Patient leans: N/A   Psychiatric Specialty Exam:   Presentation  General Appearance:  Appropriate for Environment   Eye Contact: Fair   Speech: Clear and Coherent   Speech Volume: Normal     Mood and Affect  Mood: Anxious; Depressed   Affect: Constricted     Thought Process  Thought Processes: Coherent; Goal Directed   Descriptions of Associations:Intact   Orientation:Full (Time, Place and Person)   Thought Content:Abstract Reasoning   History of Schizophrenia/Schizoaffective disorder:Yes   Duration of Psychotic Symptoms:Greater than six months   Hallucinations:Hallucinations: Auditory; Command Description of Command Hallucinations: Asking patient to kill himself   Ideas of Reference:None   Suicidal Thoughts: No   Homicidal Thoughts: No     Sensorium  Memory: Immediate Fair   Judgment: Fair   Insight: Fair     Art therapist  Concentration: Fair   Attention Span: Fair   Language: Fair     Psychomotor Activity  Psychomotor Activity: Psychomotor Activity: Decreased; Psychomotor Retardation     Assets  Assets: Communication Skills; Desire for Improvement; Housing     Sleep   Sleep: Sleep: Poor       Physical Exam: Physical Exam Constitutional:      Appearance: Normal appearance.  HENT:     Head: Normocephalic and atraumatic.     Nose: No congestion.  Eyes:     Conjunctiva/sclera: Conjunctivae normal.  Cardiovascular:     Rate and Rhythm: Normal rate.  Pulmonary:     Effort: Pulmonary effort is normal.  Skin:    General: Skin is warm.  Neurological:     General: No focal deficit present.     Mental Status: He is alert and oriented to person, place, and time.      Review of Systems  Constitutional:  Positive for malaise/fatigue. Negative for chills and fever.  HENT:  Negative for congestion and sore throat.   Eyes:  Negative for blurred vision.  Respiratory:  Positive for shortness of breath.   Cardiovascular:  Negative for chest pain and palpitations.  Gastrointestinal:  Negative for nausea and vomiting.  Musculoskeletal:  Positive for joint pain.  Neurological:  Negative for dizziness and speech change.  Psychiatric/Behavioral:  Positive for depression and hallucinations. Negative for suicidal ideas. The patient has insomnia.   Blood pressure 111/87, pulse 80, temperature 98.7 F (37.1 C), resp. rate 18, height 6\' 1"  (1.854 m), weight 98.4 kg, SpO2 97%. Body mass index is 28.63 kg/m.   Treatment Plan Summary: Daily contact with patient to assess and evaluate symptoms and progress in treatment and Medication management  Patient is admitted to locked unit under safety precaution Patient has been started on home medications including aspirin, Plavix,  Cardizem, mirtazapine 3.  Increase the dose of Invega to 6 mg at bedtime to help with auditory hallucinations, 03/26/23 4.  Will consult social worker to get collateral and help with a safe discharge plan 5.  Hemoglobin A1c and lipid profile results reviewed  Lewanda Rife, MD

## 2023-03-26 NOTE — Plan of Care (Signed)
  Problem: Education: Goal: Knowledge of General Education information will improve Description: Including pain rating scale, medication(s)/side effects and non-pharmacologic comfort measures Outcome: Progressing   Problem: Nutrition: Goal: Adequate nutrition will be maintained Outcome: Progressing   Problem: Safety: Goal: Ability to remain free from injury will improve Outcome: Progressing   

## 2023-03-26 NOTE — Progress Notes (Signed)
   03/26/23 0000  Psych Admission Type (Psych Patients Only)  Admission Status Voluntary  Psychosocial Assessment  Patient Complaints Anxiety;Agitation;Isolation  Eye Contact Brief  Facial Expression Anxious;Flat  Affect Anxious;Sad  Speech Logical/coherent  Interaction Assertive  Motor Activity Slow  Appearance/Hygiene Unremarkable  Behavior Characteristics Appropriate to situation;Cooperative;Agitated  Mood Anxious  Thought Process  Coherency WDL  Content WDL  Delusions None reported or observed  Perception Hallucinations  Hallucination Auditory  Judgment Impaired  Confusion WDL  Danger to Self  Current suicidal ideation? Denies (Denies)  Danger to Others  Danger to Others None reported or observed   Patient reported feelings of agitated due to not being able to sleep PRN haldol, benadryl, and Ativan given Patient in bed sleeping respirations noted.

## 2023-03-26 NOTE — Progress Notes (Signed)
Patient calm and cooperative. Soft spoken.  Reports he was able to get a little sleep last night.  Endorses anxiety and AH. Denies SI/HI and VH.  Denies pain.    Compliant with scheduled medications. 15 min checks in place for safety.  Patient present intermittently in dayroom.  Minimal interaction with staff and peers. Watched a movie with peers this afternoon. Frequent verbal contacts.

## 2023-03-26 NOTE — Plan of Care (Signed)
  Problem: Clinical Measurements: Goal: Will remain free from infection Outcome: Progressing   Problem: Coping: Goal: Level of anxiety will decrease Outcome: Progressing   

## 2023-03-26 NOTE — Group Note (Signed)
LCSW Group Therapy Note   Group Date: 03/26/2023 Start Time: 1350 End Time: 1450   Type of Therapy and Topic:  Group Therapy: 8 Dimensions of Wellness  Participation Level:  Did Not Attend     Summary of Patient Progress:  The patient did not attend group.    Marshell Levan, LCSWA 03/26/2023  3:27 PM

## 2023-03-27 DIAGNOSIS — F200 Paranoid schizophrenia: Secondary | ICD-10-CM | POA: Diagnosis not present

## 2023-03-27 MED ORDER — PALIPERIDONE ER 3 MG PO TB24
9.0000 mg | ORAL_TABLET | Freq: Every day | ORAL | Status: DC
  Administered 2023-03-27 – 2023-04-06 (×11): 9 mg via ORAL
  Filled 2023-03-27 (×11): qty 3

## 2023-03-27 MED ORDER — CLONAZEPAM 1 MG PO TABS
1.0000 mg | ORAL_TABLET | Freq: Every day | ORAL | Status: DC
  Administered 2023-03-27 – 2023-04-06 (×11): 1 mg via ORAL
  Filled 2023-03-27 (×11): qty 1

## 2023-03-27 NOTE — [facility] Group Notes (Signed)
Adult Psychoeducational Group Note  Date:  03/27/2023 Time:  11:56 AM  Group Topic/Focus:  Goals Group:   The focus of this group is to help patients establish daily goals to achieve during treatment and discuss how the patient can incorporate goal setting into their daily lives to aide in recovery.  Participation Level:  Did Not Attend  Participation Quality:    Affect:    Cognitive:    Insight:   Engagement in Group:    Modes of Intervention:    Additional Comments:  Pt did not attend group  [employer] 03/27/2023, 11:56 AM

## 2023-03-27 NOTE — Progress Notes (Signed)
   03/27/23 0500  Psych Admission Type (Psych Patients Only)  Admission Status Voluntary  Psychosocial Assessment  Patient Complaints Anxiety;Insomnia;Irritability  Eye Contact Brief  Facial Expression Anxious;Flat  Affect Anxious;Sad  Speech Logical/coherent  Interaction Assertive  Motor Activity Slow  Appearance/Hygiene Unremarkable  Behavior Characteristics Anxious;Agitated  Mood Anxious  Thought Process  Coherency WDL  Content WDL  Delusions None reported or observed  Perception Hallucinations  Hallucination Auditory  Judgment Impaired  Confusion WDL  Danger to Self  Current suicidal ideation? Denies (Denies)  Danger to Others  Danger to Others None reported or observed   Patient c/o sleep disturbance and anxiety prn Trazodone and Vistaril given. Medications reported not effective PRN ativan given for aggitation. Patient in bed sleeping respirations notes. No S/S of distress. Q 15 minutes safety checks ongoing. Patient remains safe.

## 2023-03-27 NOTE — BH IP Treatment Plan (Signed)
Interdisciplinary Treatment and Diagnostic Plan Update  03/27/2023 Time of Session: 9:48 AM  [name] MRN: [id number]  Principal Diagnosis: Schizophrenia [facility])  Secondary Diagnoses: Principal Problem:   Schizophrenia (HCC)   Current Medications:  Current Facility-Administered Medications  Medication Dose Route Frequency Provider Last Rate Last Admin   acetaminophen (TYLENOL) tablet 650 mg  650 mg Oral Q6H PRN Ajibola, Ene A, NP       alum & mag hydroxide-simeth (MAALOX/MYLANTA) 200-200-20 MG/5ML suspension 30 mL  30 mL Oral Q4H PRN Ajibola, Ene A, NP       aspirin EC tablet 81 mg  81 mg Oral Daily Lewanda Rife, MD   81 mg at 03/27/23 0919   clopidogrel (PLAVIX) tablet 75 mg  75 mg Oral Daily Lewanda Rife, MD   75 mg at 03/27/23 0919   diltiazem (CARDIZEM CD) 24 hr capsule 180 mg  180 mg Oral Daily Lewanda Rife, MD   180 mg at 03/27/23 0919   diphenhydrAMINE (BENADRYL) capsule 50 mg  50 mg Oral TID PRN Ajibola, Ene A, NP   50 mg at 03/25/23 2125   Or   diphenhydrAMINE (BENADRYL) injection 50 mg  50 mg Intramuscular TID PRN Ajibola, Ene A, NP       famotidine (PEPCID) tablet 20 mg  20 mg Oral Daily Lewanda Rife, MD   20 mg at 03/27/23 0919   fluticasone furoate-vilanterol (BREO ELLIPTA) 100-25 MCG/ACT 1 puff  1 puff Inhalation Daily Lewanda Rife, MD   1 puff at 03/27/23 0919   haloperidol (HALDOL) tablet 5 mg  5 mg Oral TID PRN Ajibola, Ene A, NP   5 mg at 03/25/23 2125   Or   haloperidol lactate (HALDOL) injection 5 mg  5 mg Intramuscular TID PRN Ajibola, Ene A, NP       hydrOXYzine (ATARAX) tablet 25 mg  25 mg Oral TID PRN Ajibola, Ene A, NP   25 mg at 03/26/23 2128   ipratropium-albuterol (DUONEB) 0.5-2.5 (3) MG/3ML nebulizer solution 3 mL  3 mL Nebulization Q6H PRN Lewanda Rife, MD       LORazepam (ATIVAN) tablet 2 mg  2 mg Oral TID PRN Ajibola, Ene A, NP   2 mg at 03/27/23 0404   Or   LORazepam (ATIVAN) injection 2 mg  2 mg Intramuscular TID PRN  Ajibola, Ene A, NP       magnesium hydroxide (MILK OF MAGNESIA) suspension 30 mL  30 mL Oral Daily PRN Ajibola, Ene A, NP       mirtazapine (REMERON) tablet 30 mg  30 mg Oral QHS Lewanda Rife, MD   30 mg at 03/26/23 2128   montelukast (SINGULAIR) tablet 10 mg  10 mg Oral QHS Lewanda Rife, MD   10 mg at 03/26/23 2128   nicotine (NICODERM CQ - dosed in mg/24 hours) patch 14 mg  14 mg Transdermal Daily Lewanda Rife, MD   14 mg at 03/27/23 0920   paliperidone (INVEGA) 24 hr tablet 6 mg  6 mg Oral QHS Lewanda Rife, MD   6 mg at 03/26/23 2128   traZODone (DESYREL) tablet 50 mg  50 mg Oral QHS PRN Ajibola, Ene A, NP   50 mg at 03/26/23 2128   PTA Medications: Medications Prior to Admission  Medication Sig Dispense Refill Last Dose   acetaminophen (TYLENOL) 325 MG tablet Take 650 mg by mouth 2 (two) times daily as needed for mild pain.      albuterol (VENTOLIN HFA) 108 (90 Base) MCG/ACT  inhaler Inhale 2 puffs into the lungs every 4 (four) hours as needed for wheezing or shortness of breath.      amantadine (SYMMETREL) 100 MG capsule Take 100 mg by mouth 2 (two) times daily.      aspirin EC 81 MG tablet Take 81 mg by mouth daily. Swallow whole.      clopidogrel (PLAVIX) 75 MG tablet Take 75 mg by mouth daily.      dextromethorphan-guaiFENesin (ROBITUSSIN-DM) 10-100 MG/5ML liquid Take 10 mLs by mouth every 6 (six) hours as needed for cough. (Patient not taking: Reported on 03/07/2023)      diltiazem (CARDIZEM CD) 180 MG 24 hr capsule Take 180 mg by mouth daily.       docusate sodium (COLACE) 100 MG capsule Take 100 mg by mouth 2 (two) times daily as needed for mild constipation.      famotidine (PEPCID) 20 MG tablet Take 1 tablet (20 mg total) by mouth daily. 30 tablet 0    Fluticasone-Umeclidin-Vilant (TRELEGY ELLIPTA) 100-62.5-25 MCG/ACT AEPB Inhale 1 puff into the lungs daily. 28 each 1    gabapentin (NEURONTIN) 100 MG capsule Take 100 mg by mouth at bedtime.       guaiFENesin  (MUCINEX) 600 MG 12 hr tablet Take 600 mg by mouth 2 (two) times daily as needed.      hydrochlorothiazide (HYDRODIURIL) 12.5 MG tablet Take 12.5 mg by mouth daily.      INVEGA TRINZA 819 MG/2.625ML SUSY Inject 819 mg into the muscle every 3 (three) months.       ipratropium-albuterol (DUONEB) 0.5-2.5 (3) MG/3ML SOLN Take 3 mLs by nebulization every 6 (six) hours as needed (every 4 to 6 hours as needed for shortnes of breath or wheeziing).       ketoconazole (NIZORAL) 2 % cream Apply to both feet and between toes once daily for 6 weeks. 60 g 1    lovastatin (MEVACOR) 20 MG tablet Take 20 mg by mouth at bedtime.      Melatonin 5 MG TABS Take 10 mg by mouth at bedtime.      mirtazapine (REMERON) 30 MG tablet Take 30 mg by mouth at bedtime.      montelukast (SINGULAIR) 10 MG tablet Take 1 tablet (10 mg total) by mouth at bedtime. 30 tablet 1    Multiple Vitamin (THEREMS) TABS Take 1 tablet by mouth daily. (Patient not taking: Reported on 03/24/2023)      Multiple Vitamins-Minerals (MULTIVITAMIN WITH MINERALS) tablet Take 1 tablet by mouth daily.      nicotine (NICODERM CQ - DOSED IN MG/24 HOURS) 14 mg/24hr patch Place 1 patch (14 mg total) onto the skin daily. Do not use if actively smoking cigarettes. (Patient not taking: Reported on 03/24/2023) 28 patch 0    Omega-3 Fatty Acids (FISH OIL) 1000 MG CPDR Take 1,000 mg by mouth at bedtime.      Pyridoxine HCl (B-6) 100 MG TABS Take 1 tablet by mouth daily.      QUEtiapine (SEROQUEL) 200 MG tablet Take 200 mg by mouth at bedtime. (Patient not taking: Reported on 03/07/2023)      traZODone (DESYREL) 100 MG tablet Take 100 mg by mouth at bedtime.      VITAMIN D, ERGOCALCIFEROL, PO Take 1,000 Units by mouth daily.        Patient Stressors: Financial difficulties   Health problems   Marital or family conflict    Patient Strengths: Ability for insight  Communication skills  Supportive family/friends  Treatment Modalities: Medication Management, Group  therapy, Case management,  1 to 1 session with clinician, Psychoeducation, Recreational therapy.   Physician Treatment Plan for Primary Diagnosis: Schizophrenia (HCC) Long Term Goal(s): Improvement in symptoms so as ready for discharge   Short Term Goals: Ability to identify changes in lifestyle to reduce recurrence of condition will improve Ability to verbalize feelings will improve Ability to disclose and discuss suicidal ideas Ability to demonstrate self-control will improve Ability to identify and develop effective coping behaviors will improve Ability to maintain clinical measurements within normal limits will improve Ability to identify triggers associated with substance abuse/mental health issues will improve  Medication Management: Evaluate patient's response, side effects, and tolerance of medication regimen.  Therapeutic Interventions: 1 to 1 sessions, Unit Group sessions and Medication administration.  Evaluation of Outcomes: Progressing  Physician Treatment Plan for Secondary Diagnosis: Principal Problem:   Schizophrenia (HCC)  Long Term Goal(s): Improvement in symptoms so as ready for discharge   Short Term Goals: Ability to identify changes in lifestyle to reduce recurrence of condition will improve Ability to verbalize feelings will improve Ability to disclose and discuss suicidal ideas Ability to demonstrate self-control will improve Ability to identify and develop effective coping behaviors will improve Ability to maintain clinical measurements within normal limits will improve Ability to identify triggers associated with substance abuse/mental health issues will improve     Medication Management: Evaluate patient's response, side effects, and tolerance of medication regimen.  Therapeutic Interventions: 1 to 1 sessions, Unit Group sessions and Medication administration.  Evaluation of Outcomes: Progressing   RN Treatment Plan for Primary Diagnosis:  Schizophrenia (HCC) Long Term Goal(s): Knowledge of disease and therapeutic regimen to maintain health will improve  Short Term Goals: Ability to remain free from injury will improve, Ability to verbalize frustration and anger appropriately will improve, Ability to demonstrate self-control, Ability to participate in decision making will improve, Ability to verbalize feelings will improve, Ability to disclose and discuss suicidal ideas, Ability to identify and develop effective coping behaviors will improve, and Compliance with prescribed medications will improve  Medication Management: RN will administer medications as ordered by provider, will assess and evaluate patient's response and provide education to patient for prescribed medication. RN will report any adverse and/or side effects to prescribing provider.  Therapeutic Interventions: 1 on 1 counseling sessions, Psychoeducation, Medication administration, Evaluate responses to treatment, Monitor vital signs and CBGs as ordered, Perform/monitor CIWA, COWS, AIMS and Fall Risk screenings as ordered, Perform wound care treatments as ordered.  Evaluation of Outcomes: Progressing   LCSW Treatment Plan for Primary Diagnosis: Schizophrenia (HCC) Long Term Goal(s): Safe transition to appropriate next level of care at discharge, Engage patient in therapeutic group addressing interpersonal concerns.  Short Term Goals: Engage patient in aftercare planning with referrals and resources, Increase social support, Increase ability to appropriately verbalize feelings, Increase emotional regulation, Facilitate acceptance of mental health diagnosis and concerns, and Increase skills for wellness and recovery  Therapeutic Interventions: Assess for all discharge needs, 1 to 1 time with Social worker, Explore available resources and support systems, Assess for adequacy in community support network, Educate family and significant other(s) on suicide prevention,  Complete Psychosocial Assessment, Interpersonal group therapy.  Evaluation of Outcomes: Progressing   Progress in Treatment: Attending groups: Yes. and No. Participating in groups: Yes. and No. Taking medication as prescribed: Yes. Toleration medication: Yes. Family/Significant other contact made: No, will contact:  CSW will contact if given permission  Patient understands diagnosis: Yes. Discussing patient identified  problems/goals with staff: Yes. Medical problems stabilized or resolved: Yes. Denies suicidal/homicidal ideation: Yes. Issues/concerns per patient self-inventory: No. Other: None   New problem(s) identified: No, Describe:  None identified   New Short Term/Long Term Goal(s):  elimination of symptoms of psychosis, medication management for mood stabilization; elimination of SI thoughts; development of comprehensive mental wellness plan.   Patient Goals:  "get some sleep"  Discharge Plan or Barriers: CSW will assist with appropriate discharge planning   Reason for Continuation of Hospitalization: Medication stabilization Other; describe sleep depravity induced hallucinations  Estimated Length of Stay: 1 to 7 days   Last 3 [place] Suicide Severity Risk Score: Flowsheet Row ED from 03/24/2023 in [facility] Emergency Department at [facility] Admission (Discharged) from 03/15/2023 in [facility] PERIOPER[facility] Pre-Admission Testing 45 from 03/07/2023 in [facility] PRE ADMISSION TESTING  C-SSRS RISK CATEGORY No Risk No Risk No Risk       Last PHQ 2/9 Scores:    02/03/2023   11:49 AM  Depression screen PHQ 2/9  Decreased Interest 0  Down, Depressed, Hopeless 0  PHQ - 2 Score 0    Scribe for Treatment Team: Elza Rafter, Theresia Majors 03/27/2023 10:53 AM

## 2023-03-27 NOTE — Group Note (Signed)
Recreation Therapy Group Note   Group Topic:Health and Wellness  Group Date: 03/27/2023 Start Time: 1400 End Time: 1445 Facilitators: Rosina Lowenstein, LRT, CTRS Location:  Day Room  Group Description: Seated Exercise. LRT discussed the mental and physical benefits of exercise. LRT and group discussed how physical activity can be used as a coping skill. Pt's and LRT followed along to an exercise video on the TV screen that provided a visual representation and audio description of every exercise performed. Pt's encouraged to listen to their bodies and stop at any time if they experience feelings of discomfort or pain. LRT passed out water after session was over and encouraged pts do drink and stay hydrated.  Goal Area(s) Addressed: Patient will learn benefits of physical activity. Patient will identify exercise as a coping skill.  Patient will follow multistep directions. Patient will try a new leisure interest.    Affect/Mood: Appropriate   Participation Level: Active and Engaged   Participation Quality: Independent   Behavior: Calm and Cooperative   Speech/Thought Process: Coherent   Insight: Good   Judgement: Good   Modes of Intervention: Activity   Patient Response to Interventions:  Attentive and Receptive   Education Outcome:  Acknowledges education   Clinical Observations/Individualized Feedback: Rylann was active in their participation of session activities and group discussion. Pt successfully completed all exercises shown. Pt interacted well with LRT and peers duration of session.   Plan: Continue to engage patient in RT group sessions 2-3x/week.   Rosina Lowenstein, LRT, CTRS 03/27/2023 4:18 PM

## 2023-03-27 NOTE — Progress Notes (Signed)
[facility] MD Progress Note  03/27/2023  [name]  MRN:  [id number]  Subjective: Chart reviewed, case discussed with staff, patient seen today during rounds and in treatment team meeting.  Patient reports that he feels anxious and continues to feel depressed.  Patient reports that  he got some sleep last night after getting as needed Ativan.  Patient reports his appetite is fine.  Patient denies visual hallucinations.  Continues to report auditory hallucinations, people asking him to harm himself, reports the voices frequent and intense.  He denies any intention or plan to harm himself on the unit.  Patient was provided with support.  He was encouraged to attend group and work on coping strategies.  Will increase the dose of Invega to 9 mg.    Principal Problem: Schizophrenia (HCC) Diagnosis: Principal Problem:   Schizophrenia (HCC)    Past Psychiatric History: Patient reports history of schizophrenia   Past Medical History:  Past Medical History:  Diagnosis Date   Acute on chronic respiratory failure with hypoxia (HCC)    Asthma    Coagulation disorder (HCC)    COPD (chronic obstructive pulmonary disease) (HCC)    Depression    History of hiatal hernia    Hypertension    Hypokalemia    Leukocytosis    Lung mass    Pneumonia    Schizophrenia (HCC)    Shortness of breath dyspnea    Stroke [facility])    Umbilical hernia    Ventral hernia     Past Surgical History:  Procedure Laterality Date   COLONOSCOPY WITH PROPOFOL N/A 09/21/2021   Procedure: COLONOSCOPY WITH PROPOFOL;  Surgeon: Regis Bill, MD;  Location: [facility] ENDOSCOPY;  Service: Endoscopy;  Laterality: N/A;   HERNIA REPAIR     INSERTION OF MESH N/A 12/18/2014   Procedure: INSERTION OF MESH;  Surgeon: Lattie Haw, MD;  Location: [facility] ORS;  Service: General;  Laterality: N/A;   SUPRA-UMBILICAL HERNIA  12/18/2014   Procedure: SUPRA-UMBILICAL HERNIA;  Surgeon: Lattie Haw, MD;  Location: [facility] ORS;  Service: General;;    UMBILICAL HERNIA REPAIR N/A 12/18/2014   Procedure: HERNIA REPAIR UMBILICAL ADULT;  Surgeon: Lattie Haw, MD;  Location: [facility] ORS;  Service: General;  Laterality: N/A;   VIDEO BRONCHOSCOPY WITH ENDOBRONCHIAL ULTRASOUND N/A 03/15/2023   Procedure: VIDEO BRONCHOSCOPY WITH ENDOBRONCHIAL ULTRASOUND;  Surgeon: Vida Rigger, MD;  Location: [facility] ORS;  Service: Thoracic;  Laterality: N/A;   Family History:  Family History  Problem Relation Age of Onset   Asthma Mother    Hypertension Father     Social History:  Social History   Substance and Sexual Activity  Alcohol Use Not Currently   Alcohol/week: 75.0 standard drinks of alcohol   Types: 75 Cans of beer per week     Social History   Substance and Sexual Activity  Drug Use Not Currently   Types: Marijuana, "Crack" cocaine   Comment: none in 15 yrs    Social History   Socioeconomic History   Marital status: Widowed    Spouse name: Not on file   Number of children: Not on file   Years of education: Not on file   Highest education level: Not on file  Occupational History   Not on file  Tobacco Use   Smoking status: Every Day    Current packs/day: 0.15    Average packs/day: 0.2 packs/day for 45.0 years (6.8 ttl pk-yrs)    Types: Cigarettes   Smokeless tobacco: Never  Vaping Use   Vaping status: Never Used  Substance and Sexual Activity   Alcohol use: Not Currently    Alcohol/week: 75.0 standard drinks of alcohol    Types: 75 Cans of beer per week   Drug use: Not Currently    Types: Marijuana, "Crack" cocaine    Comment: none in 15 yrs   Sexual activity: Not Currently  Other Topics Concern   Not on file  Social History Narrative   Not on file   Social Determinants of Health   Financial Resource Strain: Not on file  Food Insecurity: No Food Insecurity (03/25/2023)   Hunger Vital Sign    Worried About Running Out of Food in the Last Year: Never true    Ran Out of Food in the Last Year: Never true   Transportation Needs: No Transportation Needs (03/25/2023)   PRAPARE - Administrator, Civil Service (Medical): No    Lack of Transportation (Non-Medical): No  Physical Activity: Not on file  Stress: Not on file  Social Connections: Not on file   Additional Social History:                         Sleep: Poor  Appetite:  Fair  Current Medications: Current Facility-Administered Medications  Medication Dose Route Frequency Provider Last Rate Last Admin   acetaminophen (TYLENOL) tablet 650 mg  650 mg Oral Q6H PRN Ajibola, Ene A, NP       alum & mag hydroxide-simeth (MAALOX/MYLANTA) 200-200-20 MG/5ML suspension 30 mL  30 mL Oral Q4H PRN Ajibola, Ene A, NP       aspirin EC tablet 81 mg  81 mg Oral Daily Lewanda Rife, MD   81 mg at 03/27/23 0919   clopidogrel (PLAVIX) tablet 75 mg  75 mg Oral Daily Lewanda Rife, MD   75 mg at 03/27/23 0919   diltiazem (CARDIZEM CD) 24 hr capsule 180 mg  180 mg Oral Daily Lewanda Rife, MD   180 mg at 03/27/23 0919   diphenhydrAMINE (BENADRYL) capsule 50 mg  50 mg Oral TID PRN Ajibola, Ene A, NP   50 mg at 03/25/23 2125   Or   diphenhydrAMINE (BENADRYL) injection 50 mg  50 mg Intramuscular TID PRN Ajibola, Ene A, NP       famotidine (PEPCID) tablet 20 mg  20 mg Oral Daily Marval Regal, Hunter Pinkard, MD   20 mg at 03/27/23 0919   fluticasone furoate-vilanterol (BREO ELLIPTA) 100-25 MCG/ACT 1 puff  1 puff Inhalation Daily Lewanda Rife, MD   1 puff at 03/27/23 0919   haloperidol (HALDOL) tablet 5 mg  5 mg Oral TID PRN Ajibola, Ene A, NP   5 mg at 03/25/23 2125   Or   haloperidol lactate (HALDOL) injection 5 mg  5 mg Intramuscular TID PRN Ajibola, Ene A, NP       hydrOXYzine (ATARAX) tablet 25 mg  25 mg Oral TID PRN Ajibola, Ene A, NP   25 mg at 03/26/23 2128   ipratropium-albuterol (DUONEB) 0.5-2.5 (3) MG/3ML nebulizer solution 3 mL  3 mL Nebulization Q6H PRN Lewanda Rife, MD       LORazepam (ATIVAN) tablet 2 mg  2 mg Oral  TID PRN Ajibola, Ene A, NP   2 mg at 03/27/23 0404   Or   LORazepam (ATIVAN) injection 2 mg  2 mg Intramuscular TID PRN Ajibola, Ene A, NP       magnesium hydroxide (MILK OF MAGNESIA) suspension 30  mL  30 mL Oral Daily PRN Ajibola, Ene A, NP       mirtazapine (REMERON) tablet 30 mg  30 mg Oral QHS Lewanda Rife, MD   30 mg at 03/26/23 2128   montelukast (SINGULAIR) tablet 10 mg  10 mg Oral QHS Lewanda Rife, MD   10 mg at 03/26/23 2128   nicotine (NICODERM CQ - dosed in mg/24 hours) patch 14 mg  14 mg Transdermal Daily Lewanda Rife, MD   14 mg at 03/27/23 0920   paliperidone (INVEGA) 24 hr tablet 6 mg  6 mg Oral QHS Lewanda Rife, MD   6 mg at 03/26/23 2128   traZODone (DESYREL) tablet 50 mg  50 mg Oral QHS PRN Ajibola, Ene A, NP   50 mg at 03/26/23 2128    Lab Results:  Results for orders placed or performed during the hospital encounter of 03/25/23 (from the past 48 hour(s))  Hemoglobin A1c     Status: Abnormal   Collection Time: 03/26/23  7:09 AM  Result Value Ref Range   Hgb A1c MFr Bld 5.8 (H) 4.8 - 5.6 %    Comment: (NOTE) Pre diabetes:          5.7%-6.4%  Diabetes:              >6.4%  Glycemic control for   <7.0% adults with diabetes    Mean Plasma Glucose 119.76 mg/dL    Comment: Performed at [facility], [address] [place], [place] [address]  Lipid panel     Status: None   Collection Time: 03/26/23  7:09 AM  Result Value Ref Range   Cholesterol 108 0 - 200 mg/dL   Triglycerides 72 <629 mg/dL   HDL 43 >52 mg/dL   Total CHOL/HDL Ratio 2.5 RATIO   VLDL 14 0 - 40 mg/dL   LDL Cholesterol 51 0 - 99 mg/dL    Comment:        Total Cholesterol/HDL:CHD Risk Coronary Heart Disease Risk Table                     Men   Women  1/2 Average Risk   3.4   3.3  Average Risk       5.0   4.4  2 X Average Risk   9.6   7.1  3 X Average Risk  23.4   11.0        Use the calculated Patient Ratio above and the CHD Risk Table to determine the patient's CHD  Risk.        ATP III CLASSIFICATION (LDL):  <100     mg/dL   Optimal  841-324  mg/dL   Near or Above                    Optimal  130-159  mg/dL   Borderline  401-027  mg/dL   High  >253     mg/dL   Very High Performed at [facility], [address] [place], [place] [address]     Blood Alcohol level:  Lab Results  Component Value Date   [facility] <10 03/24/2023    Metabolic Disorder Labs: Lab Results  Component Value Date   HGBA1C 5.8 (H) 03/26/2023   MPG 119.76 03/26/2023   No results found for: "PROLACTIN" Lab Results  Component Value Date   CHOL 108 03/26/2023   TRIG 72 03/26/2023   HDL 43 03/26/2023   CHOLHDL 2.5 03/26/2023  VLDL 14 03/26/2023   LDLCALC 51 03/26/2023    Musculoskeletal: Strength & Muscle Tone: within normal limits Gait & Station: normal Patient leans: N/A   Psychiatric Specialty Exam:   Presentation  General Appearance:  Appropriate for Environment   Eye Contact: Fair   Speech: Clear and Coherent   Speech Volume: Normal     Mood and Affect  Mood: Anxious; Depressed   Affect: Constricted     Thought Process  Thought Processes: Coherent; Goal Directed   Descriptions of Associations:Intact   Orientation:Full (Time, Place and Person)   Thought Content:Abstract Reasoning   History of Schizophrenia/Schizoaffective disorder:Yes   Duration of Psychotic Symptoms:Greater than six months   Hallucinations:Hallucinations: Auditory; Command Description of Command Hallucinations: Asking patient to kill himself   Ideas of Reference:None   Suicidal Thoughts: No   Homicidal Thoughts: No     Sensorium  Memory: Immediate Fair   Judgment: Fair   Insight: Fair     Art therapist  Concentration: Fair   Attention Span: Fair   Language: Fair     Psychomotor Activity  Psychomotor Activity: Psychomotor Activity: Decreased; Psychomotor Retardation     Assets  Assets: Communication Skills; Desire  for Improvement; Housing     Sleep  Sleep: Sleep: Poor       Physical Exam: Physical Exam Constitutional:      Appearance: Normal appearance.  HENT:     Head: Normocephalic and atraumatic.     Nose: No congestion.  Eyes:     Conjunctiva/sclera: Conjunctivae normal.  Cardiovascular:     Rate and Rhythm: Normal rate.  Pulmonary:     Effort: Pulmonary effort is normal.  Skin:    General: Skin is warm.  Neurological:     General: No focal deficit present.     Mental Status: He is alert and oriented to person, place, and time.      Review of Systems  Constitutional:  Positive for malaise/fatigue. Negative for chills and fever.  HENT:  Negative for congestion and sore throat.   Eyes:  Negative for blurred vision.  Respiratory:  Positive for shortness of breath.   Cardiovascular:  Negative for chest pain and palpitations.  Gastrointestinal:  Negative for nausea and vomiting.  Musculoskeletal:  Positive for joint pain.  Neurological:  Negative for dizziness and speech change.  Psychiatric/Behavioral:  Positive for depression and hallucinations. Negative for suicidal ideas. The patient has insomnia.   Blood pressure (!) 114/93, pulse 84, temperature 98.2 F (36.8 C), resp. rate 14, height 6\' 1"  (1.854 m), weight 98.4 kg, SpO2 98%. Body mass index is 28.63 kg/m.   Treatment Plan Summary: Daily contact with patient to assess and evaluate symptoms and progress in treatment and Medication management  Patient is admitted to locked unit under safety precaution Patient has been started on home medications including aspirin, Plavix,  Cardizem, mirtazapine 3.  Increase the dose of Invega to  mg at bedtime to help with auditory hallucinations, 03/27/23 4.  Will consult social worker to get collateral and help with a safe discharge plan 5.  Will schedule Klonopin 1 mg at bedtime for anxiety and insomnia  Lewanda Rife, MD

## 2023-03-27 NOTE — Plan of Care (Signed)
  Problem: Health Behavior/Discharge Planning: Goal: Ability to manage health-related needs will improve Outcome: Progressing   Problem: Clinical Measurements: Goal: Ability to maintain clinical measurements within normal limits will improve Outcome: Progressing Goal: Will remain free from infection Outcome: Progressing   

## 2023-03-27 NOTE — Group Note (Signed)
Date:  03/27/2023 Time:  1:37 AM  Group Topic/Focus:  Self Care:   The focus of this group is to help patients understand the importance of self-care in order to improve or restore emotional, physical, spiritual, interpersonal, and financial health.    Participation Level:  Active  Participation Quality:  Appropriate  Affect:  Appropriate  Cognitive:  Appropriate  Insight: Appropriate  Engagement in Group:  Engaged  Modes of Intervention:  Education  Additional Comments:    Garry Heater 03/27/2023, 1:37 AM

## 2023-03-27 NOTE — Plan of Care (Signed)
D: Pt alert and oriented. Pt denies experiencing any anxiety/depression at this time. Pt denies experiencing any pain at this time. Pt denies experiencing any SI/HI, or AH at this time however, report experiencing AH. Pt states he hears voices but can not make out what they are saying to him.    A: Scheduled medications administered to pt, per MD orders. Support and encouragement provided. Frequent verbal contact made. Routine safety checks conducted q15 minutes.   R: No adverse drug reactions noted. Pt verbally contracts for safety at this time. Pt compliant with medications and treatment plan. Pt self isolates to his room. Pt remains safe at this time. Plan of care ongoing.  Problem: Nutrition: Goal: Adequate nutrition will be maintained Outcome: Progressing   Problem: Coping: Goal: Level of anxiety will decrease Outcome: Progressing

## 2023-03-27 NOTE — Progress Notes (Signed)
   03/27/23 2227  Psych Admission Type (Psych Patients Only)  Admission Status Voluntary  Psychosocial Assessment  Patient Complaints Sleep disturbance  Eye Contact Fair  Facial Expression Flat  Affect Flat  Speech Logical/coherent  Interaction Assertive  Motor Activity Slow  Appearance/Hygiene Unremarkable  Behavior Characteristics Cooperative;Appropriate to situation  Mood Sullen  Thought Process  Coherency WDL  Content WDL  Delusions None reported or observed  Perception Hallucinations  Hallucination Auditory  Judgment Impaired  Confusion None  Danger to Self  Current suicidal ideation? Denies

## 2023-03-27 NOTE — Group Note (Signed)
Date:  03/27/2023 Time:  10:39 PM  Group Topic/Focus:  Wellness Toolbox:   The focus of this group is to discuss various aspects of wellness, balancing those aspects and exploring ways to increase the ability to experience wellness.  Patients will create a wellness toolbox for use upon discharge.    Participation Level:  Active  Participation Quality:  Appropriate  Affect:  Appropriate  Cognitive:  Appropriate  Insight: Appropriate  Engagement in Group:  Engaged  Modes of Intervention:  Discussion  Additional Comments:    Maeola Harman 03/27/2023, 10:39 PM

## 2023-03-27 NOTE — Plan of Care (Signed)
  Problem: Nutrition: Goal: Adequate nutrition will be maintained Outcome: Progressing   Problem: Skin Integrity: Goal: Risk for impaired skin integrity will decrease Outcome: Progressing   

## 2023-03-27 NOTE — Progress Notes (Signed)
Patient given scheduled HS medications at 2116, patient went to his room shortly after medication administration and laid down in bed. Patient has been awake since. Patient voices anxiety and irritability regarding inability to fall asleep. Patient given PRN Trazodone and PRN Hydroxyzine per orders. Patient encouraged to lay down in bed with lights off to induce sleep. Patient verbalized understanding.

## 2023-03-28 ENCOUNTER — Ambulatory Visit: Payer: Medicare Other

## 2023-03-28 ENCOUNTER — Other Ambulatory Visit: Payer: Medicare Other

## 2023-03-28 DIAGNOSIS — F201 Disorganized schizophrenia: Secondary | ICD-10-CM | POA: Diagnosis not present

## 2023-03-28 MED ORDER — TRAZODONE HCL 100 MG PO TABS
100.0000 mg | ORAL_TABLET | Freq: Every evening | ORAL | Status: DC | PRN
  Administered 2023-03-28: 100 mg via ORAL
  Filled 2023-03-28: qty 1

## 2023-03-28 MED ORDER — ALBUTEROL SULFATE HFA 108 (90 BASE) MCG/ACT IN AERS
2.0000 | INHALATION_SPRAY | RESPIRATORY_TRACT | Status: DC | PRN
  Administered 2023-03-28: 2 via RESPIRATORY_TRACT
  Filled 2023-03-28: qty 6.7

## 2023-03-28 MED ORDER — ALBUTEROL SULFATE HFA 108 (90 BASE) MCG/ACT IN AERS
2.0000 | INHALATION_SPRAY | RESPIRATORY_TRACT | Status: DC
  Filled 2023-03-28: qty 6.7

## 2023-03-28 NOTE — Plan of Care (Signed)
  Problem: Health Behavior/Discharge Planning: Goal: Ability to manage health-related needs will improve Outcome: Progressing   Problem: Clinical Measurements: Goal: Ability to maintain clinical measurements within normal limits will improve Outcome: Progressing Goal: Will remain free from infection Outcome: Progressing Goal: Diagnostic test results will improve Outcome: Progressing   

## 2023-03-28 NOTE — Progress Notes (Signed)
   03/28/23 2248  Psych Admission Type (Psych Patients Only)  Admission Status Voluntary  Psychosocial Assessment  Patient Complaints Sleep disturbance  Eye Contact Fair  Facial Expression Flat  Affect Flat  Speech Logical/coherent  Interaction Assertive  Motor Activity Slow  Appearance/Hygiene Unremarkable  Behavior Characteristics Cooperative  Mood Pleasant  Thought Process  Coherency WDL  Content WDL  Delusions None reported or observed  Perception WDL  Hallucination None reported or observed  Judgment Impaired  Confusion WDL  Danger to Self  Current suicidal ideation? Denies

## 2023-03-28 NOTE — Progress Notes (Signed)
Secure chat sent to Dr. Marlou Porch requesting increase of Trazodone. Orders per Dr. Marlou Porch to increase Trazodone to 100 mg PO Q HS PRN sleep.

## 2023-03-28 NOTE — Group Note (Signed)
Recreation Therapy Group Note   Group Topic:General Recreation  Group Date: 03/28/2023 Start Time: 1400 End Time: 1500 Facilitators: Rosina Lowenstein, LRT, CTRS Location: Courtyard  Group Description: Outdoor Recreation. Patients had the option to play ring toss, corn hole or sit and listen to music while outside in the courtyard getting fresh air and sunlight. LRT and pts discussed things that they enjoy doing in their free time outside of the hospital.  Goal Area(s) Addressed: Patient will identify leisure interests.  Patient will practice healthy decision making. Patient will engage in recreation activity.   Affect/Mood: Appropriate and Flat   Participation Level: Moderate   Participation Quality: Independent   Behavior: Calm   Speech/Thought Process: Coherent   Insight: Fair   Judgement: Good   Modes of Intervention: Activity   Patient Response to Interventions:  Receptive   Education Outcome:  Acknowledges education   Clinical Observations/Individualized Feedback: [name] was somewhat active in their participation of session activities and group discussion. Pt chose to listen to music while outside in the courtyard. Pt suggested "R & B" music to be played out loud. Pt minimally spoke to LRT or peers while outside, however was appropriate.    Plan: Continue to engage patient in RT group sessions 2-3x/week.   [address], LRT, CTRS 03/28/2023 3:26 PM

## 2023-03-28 NOTE — BH Assessment (Signed)
Recreation Therapy Notes  INPATIENT RECREATION THERAPY ASSESSMENT  Patient Details Name: [name] MRN: [id number] DOB: 05-Aug-1961 Today's Date: 03/28/2023       Information Obtained From: Patient (In addition to chart review)  Able to Participate in Assessment/Interview: Yes  Patient Presentation: Responsive, Alert  Reason for Admission (Per Patient): Active Symptoms ("I wasn't sleeping and I was hearing voices")  Patient Stressors:  ("my meds quit working I had been on them so long")  Coping Skills:   Avoidance ("I try to get it out of my mind")  Leisure Interests (2+):  Sports - Basketball, Exercise - Walking  Frequency of Recreation/Participation: Weekly  Awareness of Community Resources:  Yes  Community Resources:   [employer] and doctors office")  Current Use: Yes  Expressed Interest in [employer] Information: No  [employer] of Residence:  Designer, jewellery"  Patient Main Form of Transportation:  ("I'm in a group home")  Patient Strengths:  "cooking and cleaning"  Patient Identified Areas of Improvement:  "to get my drivers license"  Patient Goal for Hospitalization:  "to get out and get my meds situated"  Current SI (including self-harm):  No  Current HI:  No  Current AVH: No  Staff Intervention Plan: Group Attendance, Collaborate with Interdisciplinary Treatment Team  Consent to Intern Participation: N/A    Pt was short and blunted with his responses, however had a bright affect when interacting.   [id number] LRT, CTRS Breton Berns E Sania Noy 03/28/2023, 8:11 AM

## 2023-03-28 NOTE — Progress Notes (Signed)
[facility] MD Progress Note  03/28/2023 12:59 PM [name]  MRN:  [id number] Subjective: [name] is seen on rounds.  His biggest complaint is hearing voices, depression, and difficulty sleeping.  He is on Invega at bedtime.  No side effects.  He has been in good controls on the unit.  Nurses report no issues.  Tells me that he lives in a group home called [facility].  I talked to him about going up on his trazodone and states he is only on 50 mg. Principal Problem: Schizophrenia (HCC) Diagnosis: Principal Problem:   Schizophrenia (HCC)  Total Time spent with patient: 15 minutes  Past Psychiatric History: Schizophrenia  Past Medical History:  Past Medical History:  Diagnosis Date   Acute on chronic respiratory failure with hypoxia (HCC)    Asthma    Coagulation disorder (HCC)    COPD (chronic obstructive pulmonary disease) (HCC)    Depression    History of hiatal hernia    Hypertension    Hypokalemia    Leukocytosis    Lung mass    Pneumonia    Schizophrenia (HCC)    Shortness of breath dyspnea    Stroke [facility])    Umbilical hernia    Ventral hernia     Past Surgical History:  Procedure Laterality Date   COLONOSCOPY WITH PROPOFOL N/A 09/21/2021   Procedure: COLONOSCOPY WITH PROPOFOL;  Surgeon: Regis Bill, MD;  Location: [facility] ENDOSCOPY;  Service: Endoscopy;  Laterality: N/A;   HERNIA REPAIR     INSERTION OF MESH N/A 12/18/2014   Procedure: INSERTION OF MESH;  Surgeon: Lattie Haw, MD;  Location: [facility] ORS;  Service: General;  Laterality: N/A;   SUPRA-UMBILICAL HERNIA  12/18/2014   Procedure: SUPRA-UMBILICAL HERNIA;  Surgeon: Lattie Haw, MD;  Location: [facility] ORS;  Service: General;;   UMBILICAL HERNIA REPAIR N/A 12/18/2014   Procedure: HERNIA REPAIR UMBILICAL ADULT;  Surgeon: Lattie Haw, MD;  Location: [facility] ORS;  Service: General;  Laterality: N/A;   VIDEO BRONCHOSCOPY WITH ENDOBRONCHIAL ULTRASOUND N/A 03/15/2023   Procedure: VIDEO BRONCHOSCOPY WITH  ENDOBRONCHIAL ULTRASOUND;  Surgeon: Vida Rigger, MD;  Location: [facility] ORS;  Service: Thoracic;  Laterality: N/A;   Family History:  Family History  Problem Relation Age of Onset   Asthma Mother    Hypertension Father    Family Psychiatric  History: Unremarkable Social History:  Social History   Substance and Sexual Activity  Alcohol Use Not Currently   Alcohol/week: 75.0 standard drinks of alcohol   Types: 75 Cans of beer per week     Social History   Substance and Sexual Activity  Drug Use Not Currently   Types: Marijuana, "Crack" cocaine   Comment: none in 15 yrs    Social History   Socioeconomic History   Marital status: Widowed    Spouse name: Not on file   Number of children: Not on file   Years of education: Not on file   Highest education level: Not on file  Occupational History   Not on file  Tobacco Use   Smoking status: Every Day    Current packs/day: 0.15    Average packs/day: 0.2 packs/day for 45.0 years (6.8 ttl pk-yrs)    Types: Cigarettes   Smokeless tobacco: Never  Vaping Use   Vaping status: Never Used  Substance and Sexual Activity   Alcohol use: Not Currently    Alcohol/week: 75.0 standard drinks of alcohol    Types: 75 Cans of beer per  week   Drug use: Not Currently    Types: Marijuana, "Crack" cocaine    Comment: none in 15 yrs   Sexual activity: Not Currently  Other Topics Concern   Not on file  Social History Narrative   Not on file   Social Determinants of Health   Financial Resource Strain: Not on file  Food Insecurity: No Food Insecurity (03/25/2023)   Hunger Vital Sign    Worried About Running Out of Food in the Last Year: Never true    Ran Out of Food in the Last Year: Never true  Transportation Needs: No Transportation Needs (03/25/2023)   PRAPARE - Administrator, Civil Service (Medical): No    Lack of Transportation (Non-Medical): No  Physical Activity: Not on file  Stress: Not on file  Social Connections:  Not on file   Additional Social History:                         Sleep: Poor  Appetite:  Fair  Current Medications: Current Facility-Administered Medications  Medication Dose Route Frequency Provider Last Rate Last Admin   acetaminophen (TYLENOL) tablet 650 mg  650 mg Oral Q6H PRN Ajibola, Ene A, NP       alum & mag hydroxide-simeth (MAALOX/MYLANTA) 200-200-20 MG/5ML suspension 30 mL  30 mL Oral Q4H PRN Ajibola, Ene A, NP       aspirin EC tablet 81 mg  81 mg Oral Daily Lewanda Rife, MD   81 mg at 03/28/23 0956   clonazePAM (KLONOPIN) tablet 1 mg  1 mg Oral QHS Lewanda Rife, MD   1 mg at 03/27/23 2116   clopidogrel (PLAVIX) tablet 75 mg  75 mg Oral Daily Lewanda Rife, MD   75 mg at 03/28/23 0956   diltiazem (CARDIZEM CD) 24 hr capsule 180 mg  180 mg Oral Daily Lewanda Rife, MD   180 mg at 03/28/23 0956   diphenhydrAMINE (BENADRYL) capsule 50 mg  50 mg Oral TID PRN Ajibola, Ene A, NP   50 mg at 03/25/23 2125   Or   diphenhydrAMINE (BENADRYL) injection 50 mg  50 mg Intramuscular TID PRN Ajibola, Ene A, NP       famotidine (PEPCID) tablet 20 mg  20 mg Oral Daily Lewanda Rife, MD   20 mg at 03/28/23 0956   fluticasone furoate-vilanterol (BREO ELLIPTA) 100-25 MCG/ACT 1 puff  1 puff Inhalation Daily Lewanda Rife, MD   1 puff at 03/28/23 0957   haloperidol (HALDOL) tablet 5 mg  5 mg Oral TID PRN Ajibola, Ene A, NP   5 mg at 03/25/23 2125   Or   haloperidol lactate (HALDOL) injection 5 mg  5 mg Intramuscular TID PRN Ajibola, Ene A, NP       hydrOXYzine (ATARAX) tablet 25 mg  25 mg Oral TID PRN Ajibola, Ene A, NP   25 mg at 03/27/23 2303   ipratropium-albuterol (DUONEB) 0.5-2.5 (3) MG/3ML nebulizer solution 3 mL  3 mL Nebulization Q6H PRN Lewanda Rife, MD       LORazepam (ATIVAN) tablet 2 mg  2 mg Oral TID PRN Ajibola, Ene A, NP   2 mg at 03/27/23 0404   Or   LORazepam (ATIVAN) injection 2 mg  2 mg Intramuscular TID PRN Ajibola, Ene A, NP        magnesium hydroxide (MILK OF MAGNESIA) suspension 30 mL  30 mL Oral Daily PRN Ajibola, Ene A, NP  mirtazapine (REMERON) tablet 30 mg  30 mg Oral QHS Lewanda Rife, MD   30 mg at 03/27/23 2116   montelukast (SINGULAIR) tablet 10 mg  10 mg Oral QHS Lewanda Rife, MD   10 mg at 03/27/23 2116   nicotine (NICODERM CQ - dosed in mg/24 hours) patch 14 mg  14 mg Transdermal Daily Lewanda Rife, MD   14 mg at 03/28/23 0958   paliperidone (INVEGA) 24 hr tablet 9 mg  9 mg Oral QHS Lewanda Rife, MD   9 mg at 03/27/23 2116   traZODone (DESYREL) tablet 50 mg  50 mg Oral QHS PRN Ajibola, Ene A, NP   50 mg at 03/27/23 2303    Lab Results: No results found for this or any previous visit (from the past 48 hour(s)).  Blood Alcohol level:  Lab Results  Component Value Date   ETH <10 03/24/2023    Metabolic Disorder Labs: Lab Results  Component Value Date   HGBA1C 5.8 (H) 03/26/2023   MPG 119.76 03/26/2023   No results found for: "PROLACTIN" Lab Results  Component Value Date   CHOL 108 03/26/2023   TRIG 72 03/26/2023   HDL 43 03/26/2023   CHOLHDL 2.5 03/26/2023   VLDL 14 03/26/2023   LDLCALC 51 03/26/2023    Physical Findings: AIMS:  , ,  ,  ,    CIWA:    COWS:     Musculoskeletal: Strength & Muscle Tone: within normal limits Gait & Station: normal Patient leans: N/A  Psychiatric Specialty Exam:  Presentation  General Appearance:  Appropriate for Environment  Eye Contact: Fair  Speech: Clear and Coherent  Speech Volume: Normal  Handedness:No data recorded  Mood and Affect  Mood: Anxious; Depressed  Affect: Constricted   Thought Process  Thought Processes: Coherent; Goal Directed  Descriptions of Associations:Intact  Orientation:Full (Time, Place and Person)  Thought Content:Abstract Reasoning  History of Schizophrenia/Schizoaffective disorder:Yes  Duration of Psychotic Symptoms:Greater than six months  Hallucinations:No data  recorded Ideas of Reference:None  Suicidal Thoughts:No data recorded Homicidal Thoughts:No data recorded  Sensorium  Memory: Immediate Fair  Judgment: Fair  Insight: Fair   Art therapist  Concentration: Fair  Attention Span: Fair  Recall:No data recorded Fund of Knowledge:No data recorded Language: Fair   Psychomotor Activity  Psychomotor Activity:No data recorded  Assets  Assets: Manufacturing systems engineer; Desire for Improvement; Housing   Sleep  Sleep:No data recorded    Blood pressure 116/69, pulse 80, temperature 98.1 F (36.7 C), resp. rate 18, height 6\' 1"  (1.854 m), weight 98.4 kg, SpO2 94%. Body mass index is 28.63 kg/m.   Treatment Plan Summary: Daily contact with patient to assess and evaluate symptoms and progress in treatment, Medication management, and Plan increase trazodone to 100 mg as needed.  Consider Thorazine.  Sarina Ill, DO 03/28/2023, 12:59 PM

## 2023-03-28 NOTE — Plan of Care (Signed)
New goal as of 03/27/23  Problem: Group Participation Goal: STG - Patient will engage in groups without prompting or encouragement from LRT x3 group sessions within 5 recreation therapy group sessions Description: STG - Patient will engage in groups without prompting or encouragement from LRT x3 group sessions within 5 recreation therapy group sessions Outcome: Not Applicable

## 2023-03-28 NOTE — Plan of Care (Signed)
D: Pt alert and oriented. Pt denies experiencing any anxiety/depression at this time. Pt denies experiencing any pain at this time. Pt denies experiencing any SI/HI at this time however endorses AVH. Pt reports hear voice but not being able to make out what they are say and states he has been seen dead people. Pt states he slept a little better last night. This afternoon the pt experienced difficulty breathing and required the use of his rescue inhaler as well as a breathing treatment.   A: Scheduled medications administered to pt, per MD orders. Support and encouragement provided. Frequent verbal contact made. Routine safety checks conducted q15 minutes.   R: No adverse drug reactions noted. Pt verbally contracts for safety at this time. Pt compliant with medications and treatment plan. Pt interacts well but minimally with others on the unit. Pt remains safe at this time. Plan of care ongoing.  Problem: Education: Goal: Knowledge of General Education information will improve Description: Including pain rating scale, medication(s)/side effects and non-pharmacologic comfort measures Outcome: Progressing   Problem: Coping: Goal: Level of anxiety will decrease Outcome: Progressing

## 2023-03-28 NOTE — Group Note (Signed)
Date:  03/28/2023 Time:  10:52 PM  Group Topic/Focus:  Healthy Communication:   The focus of this group is to discuss communication, barriers to communication, as well as healthy ways to communicate with others.    Participation Level:  Active  Participation Quality:  Appropriate  Affect:  Appropriate  Cognitive:  Appropriate  Insight: Appropriate  Engagement in Group:  Engaged  Modes of Intervention:  Discussion  Additional Comments:    Maeola Harman 03/28/2023, 10:52 PM

## 2023-03-29 ENCOUNTER — Ambulatory Visit: Payer: Medicare Other | Attending: Radiation Oncology

## 2023-03-29 ENCOUNTER — Encounter: Payer: Self-pay | Admitting: Oncology

## 2023-03-29 ENCOUNTER — Ambulatory Visit: Payer: Medicare Other

## 2023-03-29 DIAGNOSIS — F201 Disorganized schizophrenia: Secondary | ICD-10-CM | POA: Diagnosis not present

## 2023-03-29 MED ORDER — TRAZODONE HCL 50 MG PO TABS
150.0000 mg | ORAL_TABLET | Freq: Every evening | ORAL | Status: DC | PRN
  Administered 2023-03-29 – 2023-04-06 (×9): 150 mg via ORAL
  Filled 2023-03-29 (×9): qty 1

## 2023-03-29 NOTE — Progress Notes (Signed)
   03/29/23 1400  Psych Admission Type (Psych Patients Only)  Admission Status Voluntary  Psychosocial Assessment  Patient Complaints Depression;Sadness  Eye Contact Brief  Facial Expression Flat  Affect Flat  Speech Logical/coherent  Interaction Assertive  Motor Activity Slow  Appearance/Hygiene In scrubs  Behavior Characteristics Cooperative  Mood Pleasant  Thought Process  Coherency WDL  Content WDL  Delusions None reported or observed  Perception WDL  Hallucination None reported or observed  Judgment Impaired  Confusion WDL  Danger to Self  Current suicidal ideation? Denies  Danger to Others  Danger to Others None reported or observed   Patient tolerated all medications and meals. Audible Wheezing noted with inhaler given with relief. Denies SI/HI/AVH.

## 2023-03-29 NOTE — Progress Notes (Signed)
[facility] [place] MD Progress Note  03/29/2023 11:58 AM Makarios Vliet Buffone  MRN:  [id number] Subjective:  Ardin has a long history of cancer, insomnia, and schizophrenia.  His biggest complaint today is not sleeping.  I increased his trazodone last night and it did not seem to help much.  We discussed whether going up on the trazodone might be helpful or switching it to Seroquel.  He states that Seroquel has helped him in the past.  Right now he is on Invega 9 mg at bedtime but he states it does not help him sleep but it does help with the voices.  He denies any suicidal ideation. Principal Problem: Schizophrenia (HCC) Diagnosis: Principal Problem:   Schizophrenia (HCC)  Total Time spent with patient: 15 minutes  Past Psychiatric History: Schizophrenia  Past Medical History:  Past Medical History:  Diagnosis Date   Acute on chronic respiratory failure with hypoxia (HCC)    Asthma    Coagulation disorder (HCC)    COPD (chronic obstructive pulmonary disease) (HCC)    Depression    History of hiatal hernia    Hypertension    Hypokalemia    Leukocytosis    Lung mass    Pneumonia    Schizophrenia (HCC)    Shortness of breath dyspnea    Stroke [facility])    Umbilical hernia    Ventral hernia     Past Surgical History:  Procedure Laterality Date   COLONOSCOPY WITH PROPOFOL N/A 09/21/2021   Procedure: COLONOSCOPY WITH PROPOFOL;  Surgeon: Regis Bill, MD;  Location: [facility] ENDOSCOPY;  Service: Endoscopy;  Laterality: N/A;   HERNIA REPAIR     INSERTION OF MESH N/A 12/18/2014   Procedure: INSERTION OF MESH;  Surgeon: Lattie Haw, MD;  Location: [facility] ORS;  Service: General;  Laterality: N/A;   SUPRA-UMBILICAL HERNIA  12/18/2014   Procedure: SUPRA-UMBILICAL HERNIA;  Surgeon: Lattie Haw, MD;  Location: [facility] ORS;  Service: General;;   UMBILICAL HERNIA REPAIR N/A 12/18/2014   Procedure: HERNIA REPAIR UMBILICAL ADULT;  Surgeon: Lattie Haw, MD;  Location: [facility] ORS;  Service: General;  Laterality:  N/A;   VIDEO BRONCHOSCOPY WITH ENDOBRONCHIAL ULTRASOUND N/A 03/15/2023   Procedure: VIDEO BRONCHOSCOPY WITH ENDOBRONCHIAL ULTRASOUND;  Surgeon: Vida Rigger, MD;  Location: [facility] ORS;  Service: Thoracic;  Laterality: N/A;   Family History:  Family History  Problem Relation Age of Onset   Asthma Mother    Hypertension Father    Family Psychiatric  History: Unremarkable Social History:  Social History   Substance and Sexual Activity  Alcohol Use Not Currently   Alcohol/week: 75.0 standard drinks of alcohol   Types: 75 Cans of beer per week     Social History   Substance and Sexual Activity  Drug Use Not Currently   Types: Marijuana, "Crack" cocaine   Comment: none in 15 yrs    Social History   Socioeconomic History   Marital status: Widowed    Spouse name: Not on file   Number of children: Not on file   Years of education: Not on file   Highest education level: Not on file  Occupational History   Not on file  Tobacco Use   Smoking status: Every Day    Current packs/day: 0.15    Average packs/day: 0.2 packs/day for 45.0 years (6.8 ttl pk-yrs)    Types: Cigarettes   Smokeless tobacco: Never  Vaping Use   Vaping status: Never Used  Substance and Sexual Activity   Alcohol use: Not  Currently    Alcohol/week: 75.0 standard drinks of alcohol    Types: 75 Cans of beer per week   Drug use: Not Currently    Types: Marijuana, "Crack" cocaine    Comment: none in 15 yrs   Sexual activity: Not Currently  Other Topics Concern   Not on file  Social History Narrative   Not on file   Social Determinants of Health   Financial Resource Strain: Not on file  Food Insecurity: No Food Insecurity (03/25/2023)   Hunger Vital Sign    Worried About Running Out of Food in the Last Year: Never true    Ran Out of Food in the Last Year: Never true  Transportation Needs: No Transportation Needs (03/25/2023)   PRAPARE - Administrator, Civil Service (Medical): No    Lack of  Transportation (Non-Medical): No  Physical Activity: Not on file  Stress: Not on file  Social Connections: Not on file   Additional Social History:                         Sleep: Fair  Appetite:  Good  Current Medications: Current Facility-Administered Medications  Medication Dose Route Frequency Provider Last Rate Last Admin   acetaminophen (TYLENOL) tablet 650 mg  650 mg Oral Q6H PRN Ajibola, Ene A, NP       albuterol (VENTOLIN HFA) 108 (90 Base) MCG/ACT inhaler 2 puff  2 puff Inhalation Q4H PRN Sarina Ill, DO   2 puff at 03/28/23 1542   alum & mag hydroxide-simeth (MAALOX/MYLANTA) 200-200-20 MG/5ML suspension 30 mL  30 mL Oral Q4H PRN Ajibola, Ene A, NP       aspirin EC tablet 81 mg  81 mg Oral Daily Lewanda Rife, MD   81 mg at 03/29/23 0914   clonazePAM (KLONOPIN) tablet 1 mg  1 mg Oral QHS Lewanda Rife, MD   1 mg at 03/28/23 2151   clopidogrel (PLAVIX) tablet 75 mg  75 mg Oral Daily Lewanda Rife, MD   75 mg at 03/29/23 0914   diltiazem (CARDIZEM CD) 24 hr capsule 180 mg  180 mg Oral Daily Lewanda Rife, MD   180 mg at 03/29/23 0916   diphenhydrAMINE (BENADRYL) capsule 50 mg  50 mg Oral TID PRN Ajibola, Ene A, NP   50 mg at 03/25/23 2125   Or   diphenhydrAMINE (BENADRYL) injection 50 mg  50 mg Intramuscular TID PRN Ajibola, Ene A, NP       famotidine (PEPCID) tablet 20 mg  20 mg Oral Daily Marval Regal, Meenakshi, MD   20 mg at 03/29/23 0914   fluticasone furoate-vilanterol (BREO ELLIPTA) 100-25 MCG/ACT 1 puff  1 puff Inhalation Daily Lewanda Rife, MD   1 puff at 03/29/23 0916   haloperidol (HALDOL) tablet 5 mg  5 mg Oral TID PRN Ajibola, Ene A, NP   5 mg at 03/25/23 2125   Or   haloperidol lactate (HALDOL) injection 5 mg  5 mg Intramuscular TID PRN Ajibola, Ene A, NP       hydrOXYzine (ATARAX) tablet 25 mg  25 mg Oral TID PRN Ajibola, Ene A, NP   25 mg at 03/27/23 2303   ipratropium-albuterol (DUONEB) 0.5-2.5 (3) MG/3ML nebulizer solution 3  mL  3 mL Nebulization Q6H PRN Lewanda Rife, MD   3 mL at 03/28/23 1541   LORazepam (ATIVAN) tablet 2 mg  2 mg Oral TID PRN Ajibola, Ene A, NP   2 mg  at 03/27/23 0404   Or   LORazepam (ATIVAN) injection 2 mg  2 mg Intramuscular TID PRN Ajibola, Ene A, NP       magnesium hydroxide (MILK OF MAGNESIA) suspension 30 mL  30 mL Oral Daily PRN Ajibola, Ene A, NP       mirtazapine (REMERON) tablet 30 mg  30 mg Oral QHS Lewanda Rife, MD   30 mg at 03/28/23 2152   montelukast (SINGULAIR) tablet 10 mg  10 mg Oral QHS Lewanda Rife, MD   10 mg at 03/28/23 2152   nicotine (NICODERM CQ - dosed in mg/24 hours) patch 14 mg  14 mg Transdermal Daily Lewanda Rife, MD   14 mg at 03/29/23 0914   paliperidone (INVEGA) 24 hr tablet 9 mg  9 mg Oral QHS Lewanda Rife, MD   9 mg at 03/28/23 2151   traZODone (DESYREL) tablet 100 mg  100 mg Oral QHS PRN Sarina Ill, DO   100 mg at 03/28/23 2151    Lab Results: No results found for this or any previous visit (from the past 48 hour(s)).  Blood Alcohol level:  Lab Results  Component Value Date   ETH <10 03/24/2023    Metabolic Disorder Labs: Lab Results  Component Value Date   HGBA1C 5.8 (H) 03/26/2023   MPG 119.76 03/26/2023   No results found for: "PROLACTIN" Lab Results  Component Value Date   CHOL 108 03/26/2023   TRIG 72 03/26/2023   HDL 43 03/26/2023   CHOLHDL 2.5 03/26/2023   VLDL 14 03/26/2023   LDLCALC 51 03/26/2023    Physical Findings: AIMS:  , ,  ,  ,    CIWA:    COWS:     Musculoskeletal: Strength & Muscle Tone: within normal limits Gait & Station: normal Patient leans: N/A  Psychiatric Specialty Exam:  Presentation  General Appearance:  Appropriate for Environment  Eye Contact: Fair  Speech: Clear and Coherent  Speech Volume: Normal  Handedness:No data recorded  Mood and Affect  Mood: Anxious; Depressed  Affect: Constricted   Thought Process  Thought Processes: Coherent; Goal  Directed  Descriptions of Associations:Intact  Orientation:Full (Time, Place and Person)  Thought Content:Abstract Reasoning  History of Schizophrenia/Schizoaffective disorder:Yes  Duration of Psychotic Symptoms:Greater than six months  Hallucinations:No data recorded Ideas of Reference:None  Suicidal Thoughts:No data recorded Homicidal Thoughts:No data recorded  Sensorium  Memory: Immediate Fair  Judgment: Fair  Insight: Fair   Art therapist  Concentration: Fair  Attention Span: Fair  Recall:No data recorded Fund of Knowledge:No data recorded Language: Fair   Psychomotor Activity  Psychomotor Activity:No data recorded  Assets  Assets: Manufacturing systems engineer; Desire for Improvement; Housing   Sleep  Sleep:No data recorded    Blood pressure 118/84, pulse 86, temperature 98.1 F (36.7 C), resp. rate 16, height 6\' 1"  (1.854 m), weight 98.4 kg, SpO2 96%. Body mass index is 28.63 kg/m.   Treatment Plan Summary: Daily contact with patient to assess and evaluate symptoms and progress in treatment, Medication management, and Plan increase trazodone 150 mg at bedtime.  Consider Seroquel in the future.  Sarina Ill, DO 03/29/2023, 11:58 AM

## 2023-03-29 NOTE — Plan of Care (Signed)
  Problem: Safety: Goal: Ability to remain free from injury will improve Outcome: Progressing   Problem: Skin Integrity: Goal: Risk for impaired skin integrity will decrease Outcome: Progressing  Patient compliant with medications has sad affect interacting well with Peers and Staff. No adverse effects noted from medications. Support and encouragement provided.

## 2023-03-29 NOTE — [facility] Counselor (Signed)
CSW was contacted by patient's oncology provider stating he missed his appointment. They stated his appointment would be rescheduled to 04/04/23 @ 1:00 PM.  CSW informed pt. Pt verbalized understanding.  CSW informed provider.  Reynaldo Minium, MSW, [place] 03/29/2023 2:27 PM

## 2023-03-29 NOTE — Plan of Care (Signed)
  Problem: Activity: Goal: Risk for activity intolerance will decrease Outcome: Progressing   Problem: Nutrition: Goal: Adequate nutrition will be maintained Outcome: Progressing   Problem: Safety: Goal: Ability to remain free from injury will improve Outcome: Progressing   

## 2023-03-29 NOTE — Group Note (Signed)
Recreation Therapy Group Note   Group Topic:Relaxation  Group Date: 03/29/2023 Start Time: 1400 End Time: 1445 Facilitators: Rosina Lowenstein, LRT, CTRS Location: Courtyard  Group Description: Meditation. LRT and patients discussed what they know about meditation and mindfulness. LRT played a Deep Breathing Meditation exercise script for patients to follow along to. LRT and patients discussed how meditation and deep breathing can be used as a coping skill post--discharge. After the meditation, LRT took song requests from pts to listen to while getting fresh air and sunlight.    Goal Area(s) Addressed: Patient will practice using relaxation technique. Patient will identify a new coping skill.  Patient will follow multistep directions to reduce anxiety and stress.   Affect/Mood: Appropriate   Participation Level: Active and Engaged   Participation Quality: Independent   Behavior: Appropriate, Calm, and Cooperative   Speech/Thought Process: Coherent   Insight: Good   Judgement: Good   Modes of Intervention: Activity   Patient Response to Interventions:  Attentive, Engaged, and Receptive   Education Outcome:  Acknowledges education   Clinical Observations/Individualized Feedback: Leman was active in their participation of session activities and group discussion. Pt appropriately followed along with meditation prompt. Pt gave the song request of songs by Carlean Jews. Pt interacted well with LRT and peers duration of session.   Plan: Continue to engage patient in RT group sessions 2-3x/week.   [address] LRT, CTRS 03/29/2023 3:17 PM

## 2023-03-30 ENCOUNTER — Inpatient Hospital Stay: Payer: Medicare Other

## 2023-03-30 DIAGNOSIS — F201 Disorganized schizophrenia: Secondary | ICD-10-CM | POA: Diagnosis not present

## 2023-03-30 NOTE — Plan of Care (Signed)
Problem: Education: Goal: Knowledge of General Education information will improve Description Including pain rating scale, medication(s)/side effects and non-pharmacologic comfort measures Outcome: Progressing   Problem: Nutrition: Goal: Adequate nutrition will be maintained Outcome: Progressing   Problem: Coping: Goal: Level of anxiety will decrease Outcome: Progressing   Problem: Skin Integrity: Goal: Risk for impaired skin integrity will decrease Outcome: Progressing   

## 2023-03-30 NOTE — Group Note (Signed)
Recreation Therapy Group Note   Group Topic:Communication  Group Date: 03/30/2023 Start Time: 1400 End Time: 1450 Facilitators: Rosina Lowenstein, LRT, CTRS Location: Courtyard  Group Description: Reminisce Cards. Patients drew a laminated card out of a bag that had a word or phrase on it. Pt encouraged to speak about a time in their life or fond memory that specifically relates to the word they chose out of the bag. An example would be: "parenthood, meals, siblings, travel, or home".  LRT prompted following questions and encouraged contribution from peers to increase communication.   Goal Area(s) Addressed: Patient will increase verbal communication by conversing with peers. Patient will contribute to group discussion with minimal prompting. Patient will reminisce a positive memory or moment in their life.   Affect/Mood: N/A   Participation Level: Did not attend    Clinical Observations/Individualized Feedback: [name] did not attend group.  Plan: Continue to engage patient in RT group sessions 2-3x/week.   Rosina Lowenstein, LRT, CTRS 03/30/2023 3:05 PM

## 2023-03-30 NOTE — Progress Notes (Signed)
[facility] MD Progress Note  03/30/2023 12:24 PM [name] Gramlich  MRN:  [id number] Subjective: Amarr is seen on rounds.  He has a diagnosis of lung cancer.  He has been having trouble sleeping and depressed mood.  He states that he slept better last night.  He has been taking his medications as prescribed and denies any side effects.  He says that he is feeling better.  He currently lives in a group home. Principal Problem: Schizophrenia (HCC) Diagnosis: Principal Problem:   Schizophrenia (HCC)  Total Time spent with patient: 15 minutes  Past Psychiatric History: Unremarkable  Past Medical History:  Past Medical History:  Diagnosis Date   Acute on chronic respiratory failure with hypoxia (HCC)    Asthma    Coagulation disorder (HCC)    COPD (chronic obstructive pulmonary disease) (HCC)    Depression    History of hiatal hernia    Hypertension    Hypokalemia    Leukocytosis    Lung mass    Pneumonia    Schizophrenia (HCC)    Shortness of breath dyspnea    Stroke [facility])    Umbilical hernia    Ventral hernia     Past Surgical History:  Procedure Laterality Date   COLONOSCOPY WITH PROPOFOL N/A 09/21/2021   Procedure: COLONOSCOPY WITH PROPOFOL;  Surgeon: Regis Bill, MD;  Location: [facility] ENDOSCOPY;  Service: Endoscopy;  Laterality: N/A;   HERNIA REPAIR     INSERTION OF MESH N/A 12/18/2014   Procedure: INSERTION OF MESH;  Surgeon: Lattie Haw, MD;  Location: [facility] ORS;  Service: General;  Laterality: N/A;   SUPRA-UMBILICAL HERNIA  12/18/2014   Procedure: SUPRA-UMBILICAL HERNIA;  Surgeon: Lattie Haw, MD;  Location: [facility] ORS;  Service: General;;   UMBILICAL HERNIA REPAIR N/A 12/18/2014   Procedure: HERNIA REPAIR UMBILICAL ADULT;  Surgeon: Lattie Haw, MD;  Location: [facility] ORS;  Service: General;  Laterality: N/A;   VIDEO BRONCHOSCOPY WITH ENDOBRONCHIAL ULTRASOUND N/A 03/15/2023   Procedure: VIDEO BRONCHOSCOPY WITH ENDOBRONCHIAL ULTRASOUND;  Surgeon: Vida Rigger, MD;   Location: [facility] ORS;  Service: Thoracic;  Laterality: N/A;   Family History:  Family History  Problem Relation Age of Onset   Asthma Mother    Hypertension Father    Family Psychiatric  History: Unremarkable Social History:  Social History   Substance and Sexual Activity  Alcohol Use Not Currently   Alcohol/week: 75.0 standard drinks of alcohol   Types: 75 Cans of beer per week     Social History   Substance and Sexual Activity  Drug Use Not Currently   Types: Marijuana, "Crack" cocaine   Comment: none in 15 yrs    Social History   Socioeconomic History   Marital status: Widowed    Spouse name: Not on file   Number of children: Not on file   Years of education: Not on file   Highest education level: Not on file  Occupational History   Not on file  Tobacco Use   Smoking status: Every Day    Current packs/day: 0.15    Average packs/day: 0.2 packs/day for 45.0 years (6.8 ttl pk-yrs)    Types: Cigarettes   Smokeless tobacco: Never  Vaping Use   Vaping status: Never Used  Substance and Sexual Activity   Alcohol use: Not Currently    Alcohol/week: 75.0 standard drinks of alcohol    Types: 75 Cans of beer per week   Drug use: Not Currently    Types: Marijuana, "Crack" cocaine  Comment: none in 15 yrs   Sexual activity: Not Currently  Other Topics Concern   Not on file  Social History Narrative   Not on file   Social Determinants of Health   Financial Resource Strain: Not on file  Food Insecurity: No Food Insecurity (03/25/2023)   Hunger Vital Sign    Worried About Running Out of Food in the Last Year: Never true    Ran Out of Food in the Last Year: Never true  Transportation Needs: No Transportation Needs (03/25/2023)   PRAPARE - Administrator, Civil Service (Medical): No    Lack of Transportation (Non-Medical): No  Physical Activity: Not on file  Stress: Not on file  Social Connections: Not on file   Additional Social History:                          Sleep: Fair  Appetite:  Fair  Current Medications: Current Facility-Administered Medications  Medication Dose Route Frequency Provider Last Rate Last Admin   acetaminophen (TYLENOL) tablet 650 mg  650 mg Oral Q6H PRN Ajibola, Ene A, NP       albuterol (VENTOLIN HFA) 108 (90 Base) MCG/ACT inhaler 2 puff  2 puff Inhalation Q4H PRN Sarina Ill, DO   2 puff at 03/28/23 1542   alum & mag hydroxide-simeth (MAALOX/MYLANTA) 200-200-20 MG/5ML suspension 30 mL  30 mL Oral Q4H PRN Ajibola, Ene A, NP       aspirin EC tablet 81 mg  81 mg Oral Daily Lewanda Rife, MD   81 mg at 03/30/23 0905   clonazePAM (KLONOPIN) tablet 1 mg  1 mg Oral QHS Lewanda Rife, MD   1 mg at 03/29/23 2108   clopidogrel (PLAVIX) tablet 75 mg  75 mg Oral Daily Lewanda Rife, MD   75 mg at 03/30/23 0905   diltiazem (CARDIZEM CD) 24 hr capsule 180 mg  180 mg Oral Daily Lewanda Rife, MD   180 mg at 03/30/23 1610   diphenhydrAMINE (BENADRYL) capsule 50 mg  50 mg Oral TID PRN Ajibola, Ene A, NP   50 mg at 03/25/23 2125   Or   diphenhydrAMINE (BENADRYL) injection 50 mg  50 mg Intramuscular TID PRN Ajibola, Ene A, NP       famotidine (PEPCID) tablet 20 mg  20 mg Oral Daily Marval Regal, Meenakshi, MD   20 mg at 03/30/23 0905   fluticasone furoate-vilanterol (BREO ELLIPTA) 100-25 MCG/ACT 1 puff  1 puff Inhalation Daily Lewanda Rife, MD   1 puff at 03/30/23 0904   haloperidol (HALDOL) tablet 5 mg  5 mg Oral TID PRN Ajibola, Ene A, NP   5 mg at 03/25/23 2125   Or   haloperidol lactate (HALDOL) injection 5 mg  5 mg Intramuscular TID PRN Ajibola, Ene A, NP       hydrOXYzine (ATARAX) tablet 25 mg  25 mg Oral TID PRN Ajibola, Ene A, NP   25 mg at 03/27/23 2303   ipratropium-albuterol (DUONEB) 0.5-2.5 (3) MG/3ML nebulizer solution 3 mL  3 mL Nebulization Q6H PRN Lewanda Rife, MD   3 mL at 03/28/23 1541   LORazepam (ATIVAN) tablet 2 mg  2 mg Oral TID PRN Ajibola, Ene A, NP   2 mg at 03/27/23  0404   Or   LORazepam (ATIVAN) injection 2 mg  2 mg Intramuscular TID PRN Ajibola, Ene A, NP       magnesium hydroxide (MILK OF MAGNESIA) suspension 30  mL  30 mL Oral Daily PRN Ajibola, Ene A, NP       mirtazapine (REMERON) tablet 30 mg  30 mg Oral QHS Lewanda Rife, MD   30 mg at 03/29/23 2108   montelukast (SINGULAIR) tablet 10 mg  10 mg Oral QHS Lewanda Rife, MD   10 mg at 03/29/23 2112   nicotine (NICODERM CQ - dosed in mg/24 hours) patch 14 mg  14 mg Transdermal Daily Lewanda Rife, MD   14 mg at 03/30/23 0905   paliperidone (INVEGA) 24 hr tablet 9 mg  9 mg Oral QHS Lewanda Rife, MD   9 mg at 03/29/23 2107   traZODone (DESYREL) tablet 150 mg  150 mg Oral QHS PRN Sarina Ill, DO   150 mg at 03/29/23 2107    Lab Results: No results found for this or any previous visit (from the past 48 hour(s)).  Blood Alcohol level:  Lab Results  Component Value Date   ETH <10 03/24/2023    Metabolic Disorder Labs: Lab Results  Component Value Date   HGBA1C 5.8 (H) 03/26/2023   MPG 119.76 03/26/2023   No results found for: "PROLACTIN" Lab Results  Component Value Date   CHOL 108 03/26/2023   TRIG 72 03/26/2023   HDL 43 03/26/2023   CHOLHDL 2.5 03/26/2023   VLDL 14 03/26/2023   LDLCALC 51 03/26/2023    Physical Findings: AIMS:  , ,  ,  ,    CIWA:    COWS:     Musculoskeletal: Strength & Muscle Tone: within normal limits Gait & Station: normal Patient leans: N/A  Psychiatric Specialty Exam:  Presentation  General Appearance:  Appropriate for Environment  Eye Contact: Fair  Speech: Clear and Coherent  Speech Volume: Normal  Handedness:No data recorded  Mood and Affect  Mood: Anxious; Depressed  Affect: Constricted   Thought Process  Thought Processes: Coherent; Goal Directed  Descriptions of Associations:Intact  Orientation:Full (Time, Place and Person)  Thought Content:Abstract Reasoning  History of  Schizophrenia/Schizoaffective disorder:Yes  Duration of Psychotic Symptoms:Greater than six months  Hallucinations:No data recorded Ideas of Reference:None  Suicidal Thoughts:No data recorded Homicidal Thoughts:No data recorded  Sensorium  Memory: Immediate Fair  Judgment: Fair  Insight: Fair   Art therapist  Concentration: Fair  Attention Span: Fair  Recall:No data recorded Fund of Knowledge:No data recorded Language: Fair   Psychomotor Activity  Psychomotor Activity:No data recorded  Assets  Assets: Manufacturing systems engineer; Desire for Improvement; Housing   Sleep  Sleep:No data recorded    Blood pressure 136/83, pulse 97, temperature 98.4 F (36.9 C), resp. rate 15, height 6\' 1"  (1.854 m), weight 98.4 kg, SpO2 96%. Body mass index is 28.63 kg/m.   Treatment Plan Summary: Daily contact with patient to assess and evaluate symptoms and progress in treatment, Medication management, and Plan continue current medications.  Sarina Ill, DO 03/30/2023, 12:24 PM

## 2023-03-30 NOTE — Group Note (Signed)
Date:  03/30/2023 Time:  10:30 AM  Group Topic/Focus:  Coping With Mental Health Crisis:   The purpose of this group is to help patients identify strategies for coping with mental health crisis.  Group discusses possible causes of crisis and ways to manage them effectively.    Participation Level:  Active  Participation Quality:  Appropriate  Affect:  Appropriate  Cognitive:  Appropriate  Insight: Appropriate  Engagement in Group:  Engaged  Modes of Intervention:  Activity  Additional Comments:    Reham Slabaugh 03/30/2023, 10:30 AM

## 2023-03-30 NOTE — Group Note (Signed)
Date:  03/30/2023 Time:  9:05 PM  Group Topic/Focus:  Emotional Education:   The focus of this group is to discuss what feelings/emotions are, and Chavez they are experienced.    Participation Level:  Active  Participation Quality:  Appropriate  Affect:  Appropriate  Cognitive:  Appropriate  Insight: Appropriate and Improving  Engagement in Group:  Engaged  Modes of Intervention:  Education  Additional Comments:    [name] 03/30/2023, 9:05 PM

## 2023-03-30 NOTE — Group Note (Signed)
Date:  03/30/2023 Time:  6:29 AM  Group Topic/Focus:  Wrap-Up Group:   The focus of this group is to help patients review their daily goal of treatment and discuss progress on daily workbooks.    Participation Level:  Minimal  Participation Quality:  Drowsy  Affect:  Flat and Lethargic  Cognitive:  Alert  Insight: Limited  Engagement in Group:  Limited and None  Modes of Intervention:  Activity  Additional Comments:     Maglione,Kelliann Pendergraph E 03/30/2023, 6:29 AM

## 2023-03-30 NOTE — Progress Notes (Signed)
Patient calm and cooperative.  Soft spoken.  Patient states he slept better last night.  Denies SI/HI.  Reports AH have subsided. Denies pain.  Present in the milieu, minimal interaction with peers.  Participated in MHT coping skills group.  Compliant with scheduled medications.  15 min checks in place for safety.  Frequent verbal contacts.

## 2023-03-31 DIAGNOSIS — F201 Disorganized schizophrenia: Secondary | ICD-10-CM | POA: Diagnosis not present

## 2023-03-31 NOTE — Group Note (Signed)
Date:  03/31/2023 Time:  12:01 PM  Group Topic/Focus:  Activity Group:  The purpose of this group is to encourage patients to go outside in the courtyard for some fresh air and to get some exercise.    Participation Level:  Did Not Attend   [name] 03/31/2023, 12:01 PM

## 2023-03-31 NOTE — Progress Notes (Signed)
[facility] MD Progress Note  03/31/2023 1:54 PM [name]  MRN:  [id number] Subjective: Leopold is seen on rounds.  His mood continues to improve.  He has been getting out of his room more and not as isolative.  He has been compliant with medications and denies any side effects.  No evidence of EPS or TD.  Nurses report no issues.  He states that he is feeling better.  Hopefully we will be talking about discharge early next week. Principal Problem: Schizophrenia (HCC) Diagnosis: Principal Problem:   Schizophrenia (HCC)  Total Time spent with patient: 15 minutes  Past Psychiatric History: Depression and schizophrenia  Past Medical History:  Past Medical History:  Diagnosis Date   Acute on chronic respiratory failure with hypoxia (HCC)    Asthma    Coagulation disorder (HCC)    COPD (chronic obstructive pulmonary disease) (HCC)    Depression    History of hiatal hernia    Hypertension    Hypokalemia    Leukocytosis    Lung mass    Pneumonia    Schizophrenia (HCC)    Shortness of breath dyspnea    Stroke [facility])    Umbilical hernia    Ventral hernia     Past Surgical History:  Procedure Laterality Date   COLONOSCOPY WITH PROPOFOL N/A 09/21/2021   Procedure: COLONOSCOPY WITH PROPOFOL;  Surgeon: Regis Bill, MD;  Location: [facility] ENDOSCOPY;  Service: Endoscopy;  Laterality: N/A;   HERNIA REPAIR     INSERTION OF MESH N/A 12/18/2014   Procedure: INSERTION OF MESH;  Surgeon: Lattie Haw, MD;  Location: [facility] ORS;  Service: General;  Laterality: N/A;   SUPRA-UMBILICAL HERNIA  12/18/2014   Procedure: SUPRA-UMBILICAL HERNIA;  Surgeon: Lattie Haw, MD;  Location: [facility] ORS;  Service: General;;   UMBILICAL HERNIA REPAIR N/A 12/18/2014   Procedure: HERNIA REPAIR UMBILICAL ADULT;  Surgeon: Lattie Haw, MD;  Location: [facility] ORS;  Service: General;  Laterality: N/A;   VIDEO BRONCHOSCOPY WITH ENDOBRONCHIAL ULTRASOUND N/A 03/15/2023   Procedure: VIDEO BRONCHOSCOPY WITH ENDOBRONCHIAL  ULTRASOUND;  Surgeon: Vida Rigger, MD;  Location: [facility] ORS;  Service: Thoracic;  Laterality: N/A;   Family History:  Family History  Problem Relation Age of Onset   Asthma Mother    Hypertension Father    Family Psychiatric  History: Unremarkable Social History:  Social History   Substance and Sexual Activity  Alcohol Use Not Currently   Alcohol/week: 75.0 standard drinks of alcohol   Types: 75 Cans of beer per week     Social History   Substance and Sexual Activity  Drug Use Not Currently   Types: Marijuana, "Crack" cocaine   Comment: none in 15 yrs    Social History   Socioeconomic History   Marital status: Widowed    Spouse name: Not on file   Number of children: Not on file   Years of education: Not on file   Highest education level: Not on file  Occupational History   Not on file  Tobacco Use   Smoking status: Every Day    Current packs/day: 0.15    Average packs/day: 0.2 packs/day for 45.0 years (6.8 ttl pk-yrs)    Types: Cigarettes   Smokeless tobacco: Never  Vaping Use   Vaping status: Never Used  Substance and Sexual Activity   Alcohol use: Not Currently    Alcohol/week: 75.0 standard drinks of alcohol    Types: 75 Cans of beer per week   Drug use: Not  Currently    Types: Marijuana, "Crack" cocaine    Comment: none in 15 yrs   Sexual activity: Not Currently  Other Topics Concern   Not on file  Social History Narrative   Not on file   Social Determinants of Health   Financial Resource Strain: Not on file  Food Insecurity: No Food Insecurity (03/25/2023)   Hunger Vital Sign    Worried About Running Out of Food in the Last Year: Never true    Ran Out of Food in the Last Year: Never true  Transportation Needs: No Transportation Needs (03/25/2023)   PRAPARE - Administrator, Civil Service (Medical): No    Lack of Transportation (Non-Medical): No  Physical Activity: Not on file  Stress: Not on file  Social Connections: Not on file    Additional Social History:                         Sleep: Good  Appetite:  Good  Current Medications: Current Facility-Administered Medications  Medication Dose Route Frequency Provider Last Rate Last Admin   acetaminophen (TYLENOL) tablet 650 mg  650 mg Oral Q6H PRN Ajibola, Ene A, NP       albuterol (VENTOLIN HFA) 108 (90 Base) MCG/ACT inhaler 2 puff  2 puff Inhalation Q4H PRN Sarina Ill, DO   2 puff at 03/28/23 1542   alum & mag hydroxide-simeth (MAALOX/MYLANTA) 200-200-20 MG/5ML suspension 30 mL  30 mL Oral Q4H PRN Ajibola, Ene A, NP       aspirin EC tablet 81 mg  81 mg Oral Daily Lewanda Rife, MD   81 mg at 03/31/23 0931   clonazePAM (KLONOPIN) tablet 1 mg  1 mg Oral QHS Lewanda Rife, MD   1 mg at 03/30/23 2122   clopidogrel (PLAVIX) tablet 75 mg  75 mg Oral Daily Lewanda Rife, MD   75 mg at 03/31/23 0931   diltiazem (CARDIZEM CD) 24 hr capsule 180 mg  180 mg Oral Daily Lewanda Rife, MD   180 mg at 03/31/23 0931   diphenhydrAMINE (BENADRYL) capsule 50 mg  50 mg Oral TID PRN Ajibola, Ene A, NP   50 mg at 03/25/23 2125   Or   diphenhydrAMINE (BENADRYL) injection 50 mg  50 mg Intramuscular TID PRN Ajibola, Ene A, NP       famotidine (PEPCID) tablet 20 mg  20 mg Oral Daily Marval Regal, Meenakshi, MD   20 mg at 03/31/23 0931   fluticasone furoate-vilanterol (BREO ELLIPTA) 100-25 MCG/ACT 1 puff  1 puff Inhalation Daily Lewanda Rife, MD   1 puff at 03/31/23 0931   haloperidol (HALDOL) tablet 5 mg  5 mg Oral TID PRN Ajibola, Ene A, NP   5 mg at 03/25/23 2125   Or   haloperidol lactate (HALDOL) injection 5 mg  5 mg Intramuscular TID PRN Ajibola, Ene A, NP       hydrOXYzine (ATARAX) tablet 25 mg  25 mg Oral TID PRN Ajibola, Ene A, NP   25 mg at 03/27/23 2303   ipratropium-albuterol (DUONEB) 0.5-2.5 (3) MG/3ML nebulizer solution 3 mL  3 mL Nebulization Q6H PRN Lewanda Rife, MD   3 mL at 03/28/23 1541   LORazepam (ATIVAN) tablet 2 mg  2 mg Oral  TID PRN Ajibola, Ene A, NP   2 mg at 03/27/23 0404   Or   LORazepam (ATIVAN) injection 2 mg  2 mg Intramuscular TID PRN Ajibola, Ene A, NP  magnesium hydroxide (MILK OF MAGNESIA) suspension 30 mL  30 mL Oral Daily PRN Ajibola, Ene A, NP       mirtazapine (REMERON) tablet 30 mg  30 mg Oral QHS Lewanda Rife, MD   30 mg at 03/30/23 2122   montelukast (SINGULAIR) tablet 10 mg  10 mg Oral QHS Lewanda Rife, MD   10 mg at 03/30/23 2123   nicotine (NICODERM CQ - dosed in mg/24 hours) patch 14 mg  14 mg Transdermal Daily Lewanda Rife, MD   14 mg at 03/31/23 0931   paliperidone (INVEGA) 24 hr tablet 9 mg  9 mg Oral QHS Lewanda Rife, MD   9 mg at 03/30/23 2122   traZODone (DESYREL) tablet 150 mg  150 mg Oral QHS PRN Sarina Ill, DO   150 mg at 03/30/23 2125    Lab Results: No results found for this or any previous visit (from the past 48 hour(s)).  Blood Alcohol level:  Lab Results  Component Value Date   ETH <10 03/24/2023    Metabolic Disorder Labs: Lab Results  Component Value Date   HGBA1C 5.8 (H) 03/26/2023   MPG 119.76 03/26/2023   No results found for: "PROLACTIN" Lab Results  Component Value Date   CHOL 108 03/26/2023   TRIG 72 03/26/2023   HDL 43 03/26/2023   CHOLHDL 2.5 03/26/2023   VLDL 14 03/26/2023   LDLCALC 51 03/26/2023    Physical Findings: AIMS:  , ,  ,  ,    CIWA:    COWS:     Musculoskeletal: Strength & Muscle Tone: within normal limits Gait & Station: normal Patient leans: N/A  Psychiatric Specialty Exam:  Presentation  General Appearance:  Appropriate for Environment  Eye Contact: Fair  Speech: Clear and Coherent  Speech Volume: Normal  Handedness:No data recorded  Mood and Affect  Mood: Anxious; Depressed  Affect: Constricted   Thought Process  Thought Processes: Coherent; Goal Directed  Descriptions of Associations:Intact  Orientation:Full (Time, Place and Person)  Thought Content:Abstract  Reasoning  History of Schizophrenia/Schizoaffective disorder:Yes  Duration of Psychotic Symptoms:Greater than six months  Hallucinations:No data recorded Ideas of Reference:None  Suicidal Thoughts:No data recorded Homicidal Thoughts:No data recorded  Sensorium  Memory: Immediate Fair  Judgment: Fair  Insight: Fair   Art therapist  Concentration: Fair  Attention Span: Fair  Recall:No data recorded Fund of Knowledge:No data recorded Language: Fair   Psychomotor Activity  Psychomotor Activity:No data recorded  Assets  Assets: Manufacturing systems engineer; Desire for Improvement; Housing   Sleep  Sleep:No data recorded    Blood pressure 119/85, pulse 89, temperature 99 F (37.2 C), resp. rate 16, height 6\' 1"  (1.854 m), weight 98.4 kg, SpO2 97%. Body mass index is 28.63 kg/m.   Treatment Plan Summary: Daily contact with patient to assess and evaluate symptoms and progress in treatment, Medication management, and Plan continue current medication.  Sarina Ill, DO 03/31/2023, 1:54 PM

## 2023-03-31 NOTE — Plan of Care (Signed)
  Problem: Coping: Goal: Level of anxiety will decrease Outcome: Progressing   Problem: Safety: Goal: Ability to remain free from injury will improve Outcome: Progressing Patient has been compliant with medications this shift stated he is sleeping better. No adverse effects noted. Prn Trazodone given was effective, Support and encouragement provided. Routine safety checks conducted every 15 minutes. Patient notified to inform staff with problems or concerns.  Patient contracts for safety at this time. Will continue to monitor .

## 2023-03-31 NOTE — Group Note (Signed)
Recreation Therapy Group Note   Group Topic:Leisure Education  Group Date: 03/31/2023 Start Time: 1400 End Time: 1450 Facilitators: Rosina Lowenstein, LRT, CTRS Location:  Day Room  Group Description: Leisure. Patients were given the option to choose from playing bingo, singing karaoke, listening to music or going outside to the courtyard. Pts collectively chose to play bingo as a group. LRT and pts discussed the meaning of leisure, the importance of participating in leisure during their free time/when they're outside of the hospital, as well as how our leisure interests can also serve as coping skills.   Goal Area(s) Addressed:  Patient will learn the definition of "leisure". Patient will practice making a positive decision. Patient will have the opportunity to try a new leisure activity. Patient will communicate with peers and LRT.    Affect/Mood: Appropriate and Flat   Participation Level: Active and Engaged   Participation Quality: Independent   Behavior: Calm and Cooperative   Speech/Thought Process: Coherent   Insight: Fair   Judgement: Good   Modes of Intervention: Activity   Patient Response to Interventions:  Attentive, Engaged, and Receptive   Education Outcome:  Acknowledges education   Clinical Observations/Individualized Feedback: [name] was active in their participation of session activities and group discussion. Pt shared "I should buy a lottery ticket with these winning numbers" and laughed. 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 Pt interacted well with LRT and peers duration of session.   Plan: Continue to engage patient in RT group sessions 2-3x/week.   Rosina Lowenstein, LRT, CTRS 03/31/2023 3:04 PM

## 2023-03-31 NOTE — Plan of Care (Signed)
  Problem: Elimination: Goal: Will not experience complications related to bowel motility Outcome: Progressing Goal: Will not experience complications related to urinary retention Outcome: Progressing.  Alert and Oriented Presents x . Scheduled medications administered per Provider order.  Support and encouragement provided. Routine safety checks conducted every 15 minutes. Patient notified to inform staff with problems or concerns.  No adverse drug reactions noted. Denies SI/HI/VH endorses minimal AH. Patient contracts for safety at this time.

## 2023-03-31 NOTE — Plan of Care (Signed)

## 2023-03-31 NOTE — Progress Notes (Signed)
Patient calm and cooperative. Soft spoken. Reports his sleep has improved and AH have subsided.  Denies anxiety and depression.  Denies SI/HI.  Denies pain.    Compliant with scheduled medications.  15 min checks in place for safety.  Patient present in the milieu.  Limited interaction with peers.

## 2023-04-01 DIAGNOSIS — F201 Disorganized schizophrenia: Secondary | ICD-10-CM | POA: Diagnosis not present

## 2023-04-01 NOTE — BH IP Treatment Plan (Signed)
Interdisciplinary Treatment and Diagnostic Plan Update  04/01/2023 Time of Session: 3:13 pm DUY STRAWTHER MRN: [id number]  Principal Diagnosis: Schizophrenia [facility])  Secondary Diagnoses: Principal Problem:   Schizophrenia (HCC)   Current Medications:  Current Facility-Administered Medications  Medication Dose Route Frequency Provider Last Rate Last Admin   acetaminophen (TYLENOL) tablet 650 mg  650 mg Oral Q6H PRN Ajibola, Ene A, NP       albuterol (VENTOLIN HFA) 108 (90 Base) MCG/ACT inhaler 2 puff  2 puff Inhalation Q4H PRN Sarina Ill, DO   2 puff at 03/28/23 1542   alum & mag hydroxide-simeth (MAALOX/MYLANTA) 200-200-20 MG/5ML suspension 30 mL  30 mL Oral Q4H PRN Ajibola, Ene A, NP       aspirin EC tablet 81 mg  81 mg Oral Daily Lewanda Rife, MD   81 mg at 04/01/23 0953   clonazePAM (KLONOPIN) tablet 1 mg  1 mg Oral QHS Lewanda Rife, MD   1 mg at 03/31/23 2119   clopidogrel (PLAVIX) tablet 75 mg  75 mg Oral Daily Lewanda Rife, MD   75 mg at 04/01/23 0953   diltiazem (CARDIZEM CD) 24 hr capsule 180 mg  180 mg Oral Daily Lewanda Rife, MD   180 mg at 04/01/23 0953   diphenhydrAMINE (BENADRYL) capsule 50 mg  50 mg Oral TID PRN Ajibola, Ene A, NP   50 mg at 03/25/23 2125   Or   diphenhydrAMINE (BENADRYL) injection 50 mg  50 mg Intramuscular TID PRN Ajibola, Ene A, NP       famotidine (PEPCID) tablet 20 mg  20 mg Oral Daily Lewanda Rife, MD   20 mg at 04/01/23 0953   fluticasone furoate-vilanterol (BREO ELLIPTA) 100-25 MCG/ACT 1 puff  1 puff Inhalation Daily Lewanda Rife, MD   1 puff at 04/01/23 0953   haloperidol (HALDOL) tablet 5 mg  5 mg Oral TID PRN Ajibola, Ene A, NP   5 mg at 03/25/23 2125   Or   haloperidol lactate (HALDOL) injection 5 mg  5 mg Intramuscular TID PRN Ajibola, Ene A, NP       hydrOXYzine (ATARAX) tablet 25 mg  25 mg Oral TID PRN Ajibola, Ene A, NP   25 mg at 03/27/23 2303   ipratropium-albuterol (DUONEB) 0.5-2.5 (3) MG/3ML  nebulizer solution 3 mL  3 mL Nebulization Q6H PRN Lewanda Rife, MD   3 mL at 03/28/23 1541   LORazepam (ATIVAN) tablet 2 mg  2 mg Oral TID PRN Ajibola, Ene A, NP   2 mg at 03/27/23 0404   Or   LORazepam (ATIVAN) injection 2 mg  2 mg Intramuscular TID PRN Ajibola, Ene A, NP       magnesium hydroxide (MILK OF MAGNESIA) suspension 30 mL  30 mL Oral Daily PRN Ajibola, Ene A, NP       mirtazapine (REMERON) tablet 30 mg  30 mg Oral QHS Lewanda Rife, MD   30 mg at 03/31/23 2120   montelukast (SINGULAIR) tablet 10 mg  10 mg Oral QHS Lewanda Rife, MD   10 mg at 03/31/23 2118   nicotine (NICODERM CQ - dosed in mg/24 hours) patch 14 mg  14 mg Transdermal Daily Lewanda Rife, MD   14 mg at 04/01/23 0954   paliperidone (INVEGA) 24 hr tablet 9 mg  9 mg Oral QHS Lewanda Rife, MD   9 mg at 03/31/23 2119   traZODone (DESYREL) tablet 150 mg  150 mg Oral QHS PRN Sarina Ill, DO  150 mg at 03/31/23 2118   PTA Medications: Medications Prior to Admission  Medication Sig Dispense Refill Last Dose   acetaminophen (TYLENOL) 325 MG tablet Take 650 mg by mouth 2 (two) times daily as needed for mild pain.      albuterol (VENTOLIN HFA) 108 (90 Base) MCG/ACT inhaler Inhale 2 puffs into the lungs every 4 (four) hours as needed for wheezing or shortness of breath.      amantadine (SYMMETREL) 100 MG capsule Take 100 mg by mouth 2 (two) times daily.      aspirin EC 81 MG tablet Take 81 mg by mouth daily. Swallow whole.      clopidogrel (PLAVIX) 75 MG tablet Take 75 mg by mouth daily.      dextromethorphan-guaiFENesin (ROBITUSSIN-DM) 10-100 MG/5ML liquid Take 10 mLs by mouth every 6 (six) hours as needed for cough. (Patient not taking: Reported on 03/07/2023)      diltiazem (CARDIZEM CD) 180 MG 24 hr capsule Take 180 mg by mouth daily.       docusate sodium (COLACE) 100 MG capsule Take 100 mg by mouth 2 (two) times daily as needed for mild constipation.      famotidine (PEPCID) 20 MG tablet  Take 1 tablet (20 mg total) by mouth daily. 30 tablet 0    Fluticasone-Umeclidin-Vilant (TRELEGY ELLIPTA) 100-62.5-25 MCG/ACT AEPB Inhale 1 puff into the lungs daily. 28 each 1    gabapentin (NEURONTIN) 100 MG capsule Take 100 mg by mouth at bedtime.       guaiFENesin (MUCINEX) 600 MG 12 hr tablet Take 600 mg by mouth 2 (two) times daily as needed.      hydrochlorothiazide (HYDRODIURIL) 12.5 MG tablet Take 12.5 mg by mouth daily.      INVEGA TRINZA 819 MG/2.625ML SUSY Inject 819 mg into the muscle every 3 (three) months.       ipratropium-albuterol (DUONEB) 0.5-2.5 (3) MG/3ML SOLN Take 3 mLs by nebulization every 6 (six) hours as needed (every 4 to 6 hours as needed for shortnes of breath or wheeziing).       ketoconazole (NIZORAL) 2 % cream Apply to both feet and between toes once daily for 6 weeks. 60 g 1    lovastatin (MEVACOR) 20 MG tablet Take 20 mg by mouth at bedtime.      Melatonin 5 MG TABS Take 10 mg by mouth at bedtime.      mirtazapine (REMERON) 30 MG tablet Take 30 mg by mouth at bedtime.      montelukast (SINGULAIR) 10 MG tablet Take 1 tablet (10 mg total) by mouth at bedtime. 30 tablet 1    Multiple Vitamin (THEREMS) TABS Take 1 tablet by mouth daily. (Patient not taking: Reported on 03/24/2023)      Multiple Vitamins-Minerals (MULTIVITAMIN WITH MINERALS) tablet Take 1 tablet by mouth daily.      nicotine (NICODERM CQ - DOSED IN MG/24 HOURS) 14 mg/24hr patch Place 1 patch (14 mg total) onto the skin daily. Do not use if actively smoking cigarettes. (Patient not taking: Reported on 03/24/2023) 28 patch 0    Omega-3 Fatty Acids (FISH OIL) 1000 MG CPDR Take 1,000 mg by mouth at bedtime.      Pyridoxine HCl (B-6) 100 MG TABS Take 1 tablet by mouth daily.      QUEtiapine (SEROQUEL) 200 MG tablet Take 200 mg by mouth at bedtime. (Patient not taking: Reported on 03/07/2023)      traZODone (DESYREL) 100 MG tablet Take 100 mg by mouth  at bedtime.      VITAMIN D, ERGOCALCIFEROL, PO Take 1,000  Units by mouth daily.        Patient Stressors: Financial difficulties   Health problems   Marital or family conflict    Patient Strengths: Ability for insight  Communication skills  Supportive family/friends   Treatment Modalities: Medication Management, Group therapy, Case management,  1 to 1 session with clinician, Psychoeducation, Recreational therapy.   Physician Treatment Plan for Primary Diagnosis: Schizophrenia (HCC) Long Term Goal(s): Improvement in symptoms so as ready for discharge   Short Term Goals: Ability to identify changes in lifestyle to reduce recurrence of condition will improve Ability to verbalize feelings will improve Ability to disclose and discuss suicidal ideas Ability to demonstrate self-control will improve Ability to identify and develop effective coping behaviors will improve Ability to maintain clinical measurements within normal limits will improve Ability to identify triggers associated with substance abuse/mental health issues will improve  Medication Management: Evaluate patient's response, side effects, and tolerance of medication regimen.  Therapeutic Interventions: 1 to 1 sessions, Unit Group sessions and Medication administration.  Evaluation of Outcomes: Progressing  Physician Treatment Plan for Secondary Diagnosis: Principal Problem:   Schizophrenia (HCC)  Long Term Goal(s): Improvement in symptoms so as ready for discharge   Short Term Goals: Ability to identify changes in lifestyle to reduce recurrence of condition will improve Ability to verbalize feelings will improve Ability to disclose and discuss suicidal ideas Ability to demonstrate self-control will improve Ability to identify and develop effective coping behaviors will improve Ability to maintain clinical measurements within normal limits will improve Ability to identify triggers associated with substance abuse/mental health issues will improve     Medication Management:  Evaluate patient's response, side effects, and tolerance of medication regimen.  Therapeutic Interventions: 1 to 1 sessions, Unit Group sessions and Medication administration.  Evaluation of Outcomes: Progressing   RN Treatment Plan for Primary Diagnosis: Schizophrenia (HCC) Long Term Goal(s): Knowledge of disease and therapeutic regimen to maintain health will improve  Short Term Goals: Ability to remain free from injury will improve, Ability to verbalize frustration and anger appropriately will improve, Ability to demonstrate self-control, Ability to participate in decision making will improve, Ability to verbalize feelings will improve, and Compliance with prescribed medications will improve  Medication Management: RN will administer medications as ordered by provider, will assess and evaluate patient's response and provide education to patient for prescribed medication. RN will report any adverse and/or side effects to prescribing provider.  Therapeutic Interventions: 1 on 1 counseling sessions, Psychoeducation, Medication administration, Evaluate responses to treatment, Monitor vital signs and CBGs as ordered, Perform/monitor CIWA, COWS, AIMS and Fall Risk screenings as ordered, Perform wound care treatments as ordered.  Evaluation of Outcomes: Progressing   LCSW Treatment Plan for Primary Diagnosis: Schizophrenia (HCC) Long Term Goal(s): Safe transition to appropriate next level of care at discharge, Engage patient in therapeutic group addressing interpersonal concerns.  Short Term Goals: Engage patient in aftercare planning with referrals and resources, Increase social support, Increase ability to appropriately verbalize feelings, Increase emotional regulation, Facilitate acceptance of mental health diagnosis and concerns, and Increase skills for wellness and recovery  Therapeutic Interventions: Assess for all discharge needs, 1 to 1 time with Social worker, Explore available resources  and support systems, Assess for adequacy in community support network, Educate family and significant other(s) on suicide prevention, Complete Psychosocial Assessment, Interpersonal group therapy.  Evaluation of Outcomes: Progressing   Progress in Treatment: Attending groups: No. Participating  in groups: No. Taking medication as prescribed: Yes. Toleration medication: Yes. Family/Significant other contact made: No, will contact:  patient declined Patient understands diagnosis: Yes. Discussing patient identified problems/goals with staff: Yes. Medical problems stabilized or resolved: Yes. Denies suicidal/homicidal ideation: Yes. Issues/concerns per patient self-inventory: No. Other: none   New problem(s) identified: No, Describe:  none   New Short Term/Long Term Goal(s):  elimination of symptoms of psychosis, medication management for mood stabilization; elimination of SI thoughts; development of comprehensive mental wellness plan. Update 04/01/23: None at this time.   Patient Goals:  "get some sleep" Update 04/01/23: None at this time.   Discharge Plan or Barriers: CSW will assist with appropriate discharge planning  Update 04/01/23: None at this time.   Reason for Continuation of Hospitalization: Medication stabilization Other; describe sleep depravity induced hallucinations  Estimated Length of Stay: 1 to 7 days  Update 04/01/23: None at this time.  Last 3 [place] Suicide Severity Risk Score: Flowsheet Row Admission (Current) from 03/25/2023 in [facility] BEHAVIORAL MEDICINE ED from 03/24/2023 in [facility] Emergency Department at [facility] Admission (Discharged) from 03/15/2023 in [facility]  C-SSRS RISK CATEGORY No Risk No Risk No Risk       Last PHQ 2/9 Scores:    02/03/2023   11:49 AM  Depression screen PHQ 2/9  Decreased Interest 0  Down, Depressed, Hopeless 0  PHQ - 2 Score 0    Scribe for Treatment Team: Marshell Levan, LCSW 04/01/2023 3:12 PM

## 2023-04-01 NOTE — Progress Notes (Signed)

## 2023-04-01 NOTE — Group Note (Signed)
Date:  04/01/2023 Time:  11:03 AM  Group Topic/Focus:  Community Group/Outside Rec Explaining the rules and expectations of the unit and went outside and got fresh air and listened the music.   Participation Level:  Did Not Attend  Participation Quality:    Affect:    Cognitive:    Insight:   Engagement in Group:    Modes of Intervention:    Additional Comments:  Did not attend  Tiegan Jambor T Benji Poynter 04/01/2023, 11:03 AM

## 2023-04-01 NOTE — Plan of Care (Signed)
D: Pt alert and oriented. Pt denies experiencing any anxiety/depression at this time. Pt denies experiencing any pain at this time. Pt denies experiencing any SI/HI, or AVH at this time.   A: Scheduled medications administered to pt, per MD orders. Support and encouragement provided. Frequent verbal contact made. Routine safety checks conducted q15 minutes.   R: No adverse drug reactions noted. Pt verbally contracts for safety at this time. Pt compliant with medications and treatment plan. Pt interacts well with others on the unit. Pt remains safe at this time. Plan of care ongoing.  Problem: Nutrition: Goal: Adequate nutrition will be maintained Outcome: Progressing   Problem: Coping: Goal: Level of anxiety will decrease Outcome: Progressing

## 2023-04-01 NOTE — Group Note (Signed)
Date:  04/01/2023 Time:  8:44 PM  Group Topic/Focus:  Goals Group:   The focus of this group is to help patients establish daily goals to achieve during treatment and discuss how the patient can incorporate goal setting into their daily lives to aide in recovery.    Participation Level:  Active  Participation Quality:  Appropriate  Affect:  Appropriate  Cognitive:  Appropriate  Insight: Appropriate  Engagement in Group:  Engaged  Modes of Intervention:  Discussion  Additional Comments:    Burt Ek 04/01/2023, 8:44 PM

## 2023-04-01 NOTE — Progress Notes (Signed)
[facility] MD Progress Note  04/01/2023 12:18 PM [name]  MRN:  [id number] Subjective: [name] is seen on rounds.  He has no complaints.  He denies any side effects from his medications.  He states he is feeling well.  Nurses report no issues. Principal Problem: Schizophrenia (HCC) Diagnosis: Principal Problem:   Schizophrenia (HCC)  Total Time spent with patient: 15 minutes  Past Psychiatric History: Schizophrenia  Past Medical History:  Past Medical History:  Diagnosis Date   Acute on chronic respiratory failure with hypoxia (HCC)    Asthma    Coagulation disorder (HCC)    COPD (chronic obstructive pulmonary disease) (HCC)    Depression    History of hiatal hernia    Hypertension    Hypokalemia    Leukocytosis    Lung mass    Pneumonia    Schizophrenia (HCC)    Shortness of breath dyspnea    Stroke [facility])    Umbilical hernia    Ventral hernia     Past Surgical History:  Procedure Laterality Date   COLONOSCOPY WITH PROPOFOL N/A 09/21/2021   Procedure: COLONOSCOPY WITH PROPOFOL;  Surgeon: Regis Bill, MD;  Location: [facility] ENDOSCOPY;  Service: Endoscopy;  Laterality: N/A;   HERNIA REPAIR     INSERTION OF MESH N/A 12/18/2014   Procedure: INSERTION OF MESH;  Surgeon: Lattie Haw, MD;  Location: [facility] ORS;  Service: General;  Laterality: N/A;   SUPRA-UMBILICAL HERNIA  12/18/2014   Procedure: SUPRA-UMBILICAL HERNIA;  Surgeon: Lattie Haw, MD;  Location: [facility] ORS;  Service: General;;   UMBILICAL HERNIA REPAIR N/A 12/18/2014   Procedure: HERNIA REPAIR UMBILICAL ADULT;  Surgeon: Lattie Haw, MD;  Location: [facility] ORS;  Service: General;  Laterality: N/A;   VIDEO BRONCHOSCOPY WITH ENDOBRONCHIAL ULTRASOUND N/A 03/15/2023   Procedure: VIDEO BRONCHOSCOPY WITH ENDOBRONCHIAL ULTRASOUND;  Surgeon: Vida Rigger, MD;  Location: [facility] ORS;  Service: Thoracic;  Laterality: N/A;   Family History:  Family History  Problem Relation Age of Onset   Asthma Mother    Hypertension  Father    Family Psychiatric  History: Unremarkable Social History:  Social History   Substance and Sexual Activity  Alcohol Use Not Currently   Alcohol/week: 75.0 standard drinks of alcohol   Types: 75 Cans of beer per week     Social History   Substance and Sexual Activity  Drug Use Not Currently   Types: Marijuana, "Crack" cocaine   Comment: none in 15 yrs    Social History   Socioeconomic History   Marital status: Widowed    Spouse name: Not on file   Number of children: Not on file   Years of education: Not on file   Highest education level: Not on file  Occupational History   Not on file  Tobacco Use   Smoking status: Every Day    Current packs/day: 0.15    Average packs/day: 0.2 packs/day for 45.0 years (6.8 ttl pk-yrs)    Types: Cigarettes   Smokeless tobacco: Never  Vaping Use   Vaping status: Never Used  Substance and Sexual Activity   Alcohol use: Not Currently    Alcohol/week: 75.0 standard drinks of alcohol    Types: 75 Cans of beer per week   Drug use: Not Currently    Types: Marijuana, "Crack" cocaine    Comment: none in 15 yrs   Sexual activity: Not Currently  Other Topics Concern   Not on file  Social History Narrative   Not on  file   Social Determinants of Health   Financial Resource Strain: Not on file  Food Insecurity: No Food Insecurity (03/25/2023)   Hunger Vital Sign    Worried About Running Out of Food in the Last Year: Never true    Ran Out of Food in the Last Year: Never true  Transportation Needs: No Transportation Needs (03/25/2023)   PRAPARE - Administrator, Civil Service (Medical): No    Lack of Transportation (Non-Medical): No  Physical Activity: Not on file  Stress: Not on file  Social Connections: Not on file   Additional Social History:                         Sleep: Good  Appetite:  Good  Current Medications: Current Facility-Administered Medications  Medication Dose Route Frequency  Provider Last Rate Last Admin   acetaminophen (TYLENOL) tablet 650 mg  650 mg Oral Q6H PRN Ajibola, Ene A, NP       albuterol (VENTOLIN HFA) 108 (90 Base) MCG/ACT inhaler 2 puff  2 puff Inhalation Q4H PRN Sarina Ill, DO   2 puff at 03/28/23 1542   alum & mag hydroxide-simeth (MAALOX/MYLANTA) 200-200-20 MG/5ML suspension 30 mL  30 mL Oral Q4H PRN Ajibola, Ene A, NP       aspirin EC tablet 81 mg  81 mg Oral Daily Lewanda Rife, MD   81 mg at 04/01/23 0953   clonazePAM (KLONOPIN) tablet 1 mg  1 mg Oral QHS Lewanda Rife, MD   1 mg at 03/31/23 2119   clopidogrel (PLAVIX) tablet 75 mg  75 mg Oral Daily Lewanda Rife, MD   75 mg at 04/01/23 0953   diltiazem (CARDIZEM CD) 24 hr capsule 180 mg  180 mg Oral Daily Lewanda Rife, MD   180 mg at 04/01/23 0953   diphenhydrAMINE (BENADRYL) capsule 50 mg  50 mg Oral TID PRN Ajibola, Ene A, NP   50 mg at 03/25/23 2125   Or   diphenhydrAMINE (BENADRYL) injection 50 mg  50 mg Intramuscular TID PRN Ajibola, Ene A, NP       famotidine (PEPCID) tablet 20 mg  20 mg Oral Daily Marval Regal, Meenakshi, MD   20 mg at 04/01/23 0953   fluticasone furoate-vilanterol (BREO ELLIPTA) 100-25 MCG/ACT 1 puff  1 puff Inhalation Daily Lewanda Rife, MD   1 puff at 04/01/23 0953   haloperidol (HALDOL) tablet 5 mg  5 mg Oral TID PRN Ajibola, Ene A, NP   5 mg at 03/25/23 2125   Or   haloperidol lactate (HALDOL) injection 5 mg  5 mg Intramuscular TID PRN Ajibola, Ene A, NP       hydrOXYzine (ATARAX) tablet 25 mg  25 mg Oral TID PRN Ajibola, Ene A, NP   25 mg at 03/27/23 2303   ipratropium-albuterol (DUONEB) 0.5-2.5 (3) MG/3ML nebulizer solution 3 mL  3 mL Nebulization Q6H PRN Lewanda Rife, MD   3 mL at 03/28/23 1541   LORazepam (ATIVAN) tablet 2 mg  2 mg Oral TID PRN Ajibola, Ene A, NP   2 mg at 03/27/23 0404   Or   LORazepam (ATIVAN) injection 2 mg  2 mg Intramuscular TID PRN Ajibola, Ene A, NP       magnesium hydroxide (MILK OF MAGNESIA) suspension 30  mL  30 mL Oral Daily PRN Ajibola, Ene A, NP       mirtazapine (REMERON) tablet 30 mg  30 mg Oral QHS Parmar,  Meenakshi, MD   30 mg at 03/31/23 2120   montelukast (SINGULAIR) tablet 10 mg  10 mg Oral QHS Lewanda Rife, MD   10 mg at 03/31/23 2118   nicotine (NICODERM CQ - dosed in mg/24 hours) patch 14 mg  14 mg Transdermal Daily Lewanda Rife, MD   14 mg at 04/01/23 0954   paliperidone (INVEGA) 24 hr tablet 9 mg  9 mg Oral QHS Lewanda Rife, MD   9 mg at 03/31/23 2119   traZODone (DESYREL) tablet 150 mg  150 mg Oral QHS PRN Sarina Ill, DO   150 mg at 03/31/23 2118    Lab Results: No results found for this or any previous visit (from the past 48 hour(s)).  Blood Alcohol level:  Lab Results  Component Value Date   ETH <10 03/24/2023    Metabolic Disorder Labs: Lab Results  Component Value Date   HGBA1C 5.8 (H) 03/26/2023   MPG 119.76 03/26/2023   No results found for: "PROLACTIN" Lab Results  Component Value Date   CHOL 108 03/26/2023   TRIG 72 03/26/2023   HDL 43 03/26/2023   CHOLHDL 2.5 03/26/2023   VLDL 14 03/26/2023   LDLCALC 51 03/26/2023    Physical Findings: AIMS:  , ,  ,  ,    CIWA:    COWS:     Musculoskeletal: Strength & Muscle Tone: within normal limits Gait & Station: normal Patient leans: N/A  Psychiatric Specialty Exam:  Presentation  General Appearance:  Appropriate for Environment  Eye Contact: Fair  Speech: Clear and Coherent  Speech Volume: Normal  Handedness:No data recorded  Mood and Affect  Mood: Anxious; Depressed  Affect: Constricted   Thought Process  Thought Processes: Coherent; Goal Directed  Descriptions of Associations:Intact  Orientation:Full (Time, Place and Person)  Thought Content:Abstract Reasoning  History of Schizophrenia/Schizoaffective disorder:Yes  Duration of Psychotic Symptoms:Greater than six months  Hallucinations:No data recorded Ideas of Reference:None  Suicidal  Thoughts:No data recorded Homicidal Thoughts:No data recorded  Sensorium  Memory: Immediate Fair  Judgment: Fair  Insight: Fair   Art therapist  Concentration: Fair  Attention Span: Fair  Recall:No data recorded Fund of Knowledge:No data recorded Language: Fair   Psychomotor Activity  Psychomotor Activity:No data recorded  Assets  Assets: Manufacturing systems engineer; Desire for Improvement; Housing   Sleep  Sleep:No data recorded    Blood pressure 127/83, pulse 97, temperature (!) 97.5 F (36.4 C), resp. rate 15, height 6\' 1"  (1.854 m), weight 98.4 kg, SpO2 95%. Body mass index is 28.63 kg/m.   Treatment Plan Summary: Daily contact with patient to assess and evaluate symptoms and progress in treatment, Medication management, and Plan continue current medications.  Sarina Ill, DO 04/01/2023, 12:18 PM

## 2023-04-02 DIAGNOSIS — F201 Disorganized schizophrenia: Secondary | ICD-10-CM | POA: Diagnosis not present

## 2023-04-02 NOTE — Group Note (Signed)
Date:  04/02/2023 Time:  10:51 PM  Group Topic/Focus:  Making Healthy Choices:   The focus of this group is to help patients identify negative/unhealthy choices they were using prior to admission and identify positive/healthier coping strategies to replace them upon discharge.    Participation Level:  Active  Participation Quality:  Appropriate  Affect:  Appropriate  Cognitive:  Alert  Insight: Appropriate  Engagement in Group:  Engaged  Modes of Intervention:  Discussion  Additional Comments:    Maeola Harman 04/02/2023, 10:51 PM

## 2023-04-02 NOTE — Progress Notes (Signed)
[facility] MD Progress Note  04/02/2023 12:57 PM [name]  MRN:  [id number] Subjective: [name] is seen on rounds.  He states that he is doing well.  He interacts well with staff and peers.  Nurses report no issues.  No side effects from his medications. Principal Problem: Schizophrenia (HCC) Diagnosis: Principal Problem:   Schizophrenia (HCC)  Total Time spent with patient: 15 minutes  Past Psychiatric History: Schizophrenia  Past Medical History:  Past Medical History:  Diagnosis Date   Acute on chronic respiratory failure with hypoxia (HCC)    Asthma    Coagulation disorder (HCC)    COPD (chronic obstructive pulmonary disease) (HCC)    Depression    History of hiatal hernia    Hypertension    Hypokalemia    Leukocytosis    Lung mass    Pneumonia    Schizophrenia (HCC)    Shortness of breath dyspnea    Stroke [facility])    Umbilical hernia    Ventral hernia     Past Surgical History:  Procedure Laterality Date   COLONOSCOPY WITH PROPOFOL N/A 09/21/2021   Procedure: COLONOSCOPY WITH PROPOFOL;  Surgeon: Regis Bill, MD;  Location: [facility] ENDOSCOPY;  Service: Endoscopy;  Laterality: N/A;   HERNIA REPAIR     INSERTION OF MESH N/A 12/18/2014   Procedure: INSERTION OF MESH;  Surgeon: Lattie Haw, MD;  Location: [facility] ORS;  Service: General;  Laterality: N/A;   SUPRA-UMBILICAL HERNIA  12/18/2014   Procedure: SUPRA-UMBILICAL HERNIA;  Surgeon: Lattie Haw, MD;  Location: [facility] ORS;  Service: General;;   UMBILICAL HERNIA REPAIR N/A 12/18/2014   Procedure: HERNIA REPAIR UMBILICAL ADULT;  Surgeon: Lattie Haw, MD;  Location: [facility] ORS;  Service: General;  Laterality: N/A;   VIDEO BRONCHOSCOPY WITH ENDOBRONCHIAL ULTRASOUND N/A 03/15/2023   Procedure: VIDEO BRONCHOSCOPY WITH ENDOBRONCHIAL ULTRASOUND;  Surgeon: Vida Rigger, MD;  Location: [facility] ORS;  Service: Thoracic;  Laterality: N/A;   Family History:  Family History  Problem Relation Age of Onset   Asthma Mother     Hypertension Father    Family Psychiatric  History: Unremarkable Social History:  Social History   Substance and Sexual Activity  Alcohol Use Not Currently   Alcohol/week: 75.0 standard drinks of alcohol   Types: 75 Cans of beer per week     Social History   Substance and Sexual Activity  Drug Use Not Currently   Types: Marijuana, "Crack" cocaine   Comment: none in 15 yrs    Social History   Socioeconomic History   Marital status: Widowed    Spouse name: Not on file   Number of children: Not on file   Years of education: Not on file   Highest education level: Not on file  Occupational History   Not on file  Tobacco Use   Smoking status: Every Day    Current packs/day: 0.15    Average packs/day: 0.2 packs/day for 45.0 years (6.8 ttl pk-yrs)    Types: Cigarettes   Smokeless tobacco: Never  Vaping Use   Vaping status: Never Used  Substance and Sexual Activity   Alcohol use: Not Currently    Alcohol/week: 75.0 standard drinks of alcohol    Types: 75 Cans of beer per week   Drug use: Not Currently    Types: Marijuana, "Crack" cocaine    Comment: none in 15 yrs   Sexual activity: Not Currently  Other Topics Concern   Not on file  Social History Narrative  Not on file   Social Determinants of Health   Financial Resource Strain: Not on file  Food Insecurity: No Food Insecurity (03/25/2023)   Hunger Vital Sign    Worried About Running Out of Food in the Last Year: Never true    Ran Out of Food in the Last Year: Never true  Transportation Needs: No Transportation Needs (03/25/2023)   PRAPARE - Administrator, Civil Service (Medical): No    Lack of Transportation (Non-Medical): No  Physical Activity: Not on file  Stress: Not on file  Social Connections: Not on file   Additional Social History:                         Sleep: Good  Appetite:  Good  Current Medications: Current Facility-Administered Medications  Medication Dose Route  Frequency Provider Last Rate Last Admin   acetaminophen (TYLENOL) tablet 650 mg  650 mg Oral Q6H PRN Ajibola, Ene A, NP       albuterol (VENTOLIN HFA) 108 (90 Base) MCG/ACT inhaler 2 puff  2 puff Inhalation Q4H PRN Sarina Ill, DO   2 puff at 03/28/23 1542   alum & mag hydroxide-simeth (MAALOX/MYLANTA) 200-200-20 MG/5ML suspension 30 mL  30 mL Oral Q4H PRN Ajibola, Ene A, NP       aspirin EC tablet 81 mg  81 mg Oral Daily Lewanda Rife, MD   81 mg at 04/02/23 0958   clonazePAM (KLONOPIN) tablet 1 mg  1 mg Oral QHS Lewanda Rife, MD   1 mg at 04/01/23 2207   clopidogrel (PLAVIX) tablet 75 mg  75 mg Oral Daily Lewanda Rife, MD   75 mg at 04/02/23 0958   diltiazem (CARDIZEM CD) 24 hr capsule 180 mg  180 mg Oral Daily Lewanda Rife, MD   180 mg at 04/02/23 1610   diphenhydrAMINE (BENADRYL) capsule 50 mg  50 mg Oral TID PRN Ajibola, Ene A, NP   50 mg at 03/25/23 2125   Or   diphenhydrAMINE (BENADRYL) injection 50 mg  50 mg Intramuscular TID PRN Ajibola, Ene A, NP       famotidine (PEPCID) tablet 20 mg  20 mg Oral Daily Lewanda Rife, MD   20 mg at 04/02/23 0958   fluticasone furoate-vilanterol (BREO ELLIPTA) 100-25 MCG/ACT 1 puff  1 puff Inhalation Daily Lewanda Rife, MD   1 puff at 04/02/23 0825   haloperidol (HALDOL) tablet 5 mg  5 mg Oral TID PRN Ajibola, Ene A, NP   5 mg at 03/25/23 2125   Or   haloperidol lactate (HALDOL) injection 5 mg  5 mg Intramuscular TID PRN Ajibola, Ene A, NP       hydrOXYzine (ATARAX) tablet 25 mg  25 mg Oral TID PRN Ajibola, Ene A, NP   25 mg at 03/27/23 2303   ipratropium-albuterol (DUONEB) 0.5-2.5 (3) MG/3ML nebulizer solution 3 mL  3 mL Nebulization Q6H PRN Lewanda Rife, MD   3 mL at 03/28/23 1541   LORazepam (ATIVAN) tablet 2 mg  2 mg Oral TID PRN Ajibola, Ene A, NP   2 mg at 03/27/23 0404   Or   LORazepam (ATIVAN) injection 2 mg  2 mg Intramuscular TID PRN Ajibola, Ene A, NP       magnesium hydroxide (MILK OF MAGNESIA)  suspension 30 mL  30 mL Oral Daily PRN Ajibola, Ene A, NP       mirtazapine (REMERON) tablet 30 mg  30 mg Oral  Loura Pardon, MD   30 mg at 04/01/23 2231   montelukast (SINGULAIR) tablet 10 mg  10 mg Oral QHS Lewanda Rife, MD   10 mg at 04/01/23 2232   nicotine (NICODERM CQ - dosed in mg/24 hours) patch 14 mg  14 mg Transdermal Daily Lewanda Rife, MD   14 mg at 04/02/23 0957   paliperidone (INVEGA) 24 hr tablet 9 mg  9 mg Oral QHS Lewanda Rife, MD   9 mg at 04/01/23 2206   traZODone (DESYREL) tablet 150 mg  150 mg Oral QHS PRN Sarina Ill, DO   150 mg at 04/01/23 2206    Lab Results: No results found for this or any previous visit (from the past 48 hour(s)).  Blood Alcohol level:  Lab Results  Component Value Date   ETH <10 03/24/2023    Metabolic Disorder Labs: Lab Results  Component Value Date   HGBA1C 5.8 (H) 03/26/2023   MPG 119.76 03/26/2023   No results found for: "PROLACTIN" Lab Results  Component Value Date   CHOL 108 03/26/2023   TRIG 72 03/26/2023   HDL 43 03/26/2023   CHOLHDL 2.5 03/26/2023   VLDL 14 03/26/2023   LDLCALC 51 03/26/2023    Physical Findings: AIMS:  , ,  ,  ,    CIWA:    COWS:     Musculoskeletal: Strength & Muscle Tone: within normal limits Gait & Station: normal Patient leans: N/A  Psychiatric Specialty Exam:  Presentation  General Appearance:  Appropriate for Environment  Eye Contact: Fair  Speech: Clear and Coherent  Speech Volume: Normal  Handedness:No data recorded  Mood and Affect  Mood: Anxious; Depressed  Affect: Constricted   Thought Process  Thought Processes: Coherent; Goal Directed  Descriptions of Associations:Intact  Orientation:Full (Time, Place and Person)  Thought Content:Abstract Reasoning  History of Schizophrenia/Schizoaffective disorder:Yes  Duration of Psychotic Symptoms:Greater than six months  Hallucinations:No data recorded Ideas of  Reference:None  Suicidal Thoughts:No data recorded Homicidal Thoughts:No data recorded  Sensorium  Memory: Immediate Fair  Judgment: Fair  Insight: Fair   Art therapist  Concentration: Fair  Attention Span: Fair  Recall:No data recorded Fund of Knowledge:No data recorded Language: Fair   Psychomotor Activity  Psychomotor Activity:No data recorded  Assets  Assets: Manufacturing systems engineer; Desire for Improvement; Housing   Sleep  Sleep:No data recorded    Blood pressure 115/69, pulse 92, temperature (!) 97.4 F (36.3 C), resp. rate 20, height 6\' 1"  (1.854 m), weight 98.4 kg, SpO2 95%. Body mass index is 28.63 kg/m.   Treatment Plan Summary: Daily contact with patient to assess and evaluate symptoms and progress in treatment, Medication management, and Plan continue current medications.  Sarina Ill, DO 04/02/2023, 12:57 PM

## 2023-04-02 NOTE — Group Note (Signed)
[facility] LCSW Group Therapy Note   Group Date: 04/02/2023 Start Time: 1300 End Time: 1350   Type of Therapy/Topic:  Group Therapy:  Emotion Regulation  Participation Level:  Did Not Attend   Mood:  Description of Group:    The purpose of this group is to assist patients in learning to regulate negative emotions and experience positive emotions. Patients will be guided to discuss ways in which they have been vulnerable to their negative emotions. These vulnerabilities will be juxtaposed with experiences of positive emotions or situations, and patients challenged to use positive emotions to combat negative ones. Special emphasis will be placed on coping with negative emotions in conflict situations, and patients will process healthy conflict resolution skills.  Therapeutic Goals: Patient will identify two positive emotions or experiences to reflect on in order to balance out negative emotions:  Patient will label two or more emotions that they find the most difficult to experience:  Patient will be able to demonstrate positive conflict resolution skills through discussion or role plays:   Summary of Patient Progress:   Patient did not attend group   Azucena Kuba, LCSWA

## 2023-04-02 NOTE — Progress Notes (Signed)
Patient is a voluntary admission to [name] for Schizophrenia/ depression, not sleeping. Patient is calm, cooperative and takes his medications with no issues.  Tends to isolate in his room, or sit in the corner of the dayroom alone watching tv. Interacts well with staff when approached.  Ambulates well and completes his own ADL's when prompted. Denies S/I, H/I, and AVH.  Will continue to monitor.

## 2023-04-02 NOTE — Plan of Care (Signed)
  Problem: Nutrition: Goal: Adequate nutrition will be maintained Outcome: Progressing   Problem: Coping: Goal: Level of anxiety will decrease Outcome: Progressing   

## 2023-04-02 NOTE — Group Note (Signed)
Date:  04/02/2023 Time:  9:30 AM  Group Topic/Focus:  Goals Group:   The focus of this group is to help patients establish daily goals to achieve during treatment and discuss how the patient can incorporate goal setting into their daily lives to aide in recovery.   Participation Level:  Did Not Attend   Carolyn Sylvia A Taegan Standage 04/02/2023, 9:30 AM

## 2023-04-03 DIAGNOSIS — F201 Disorganized schizophrenia: Secondary | ICD-10-CM | POA: Diagnosis not present

## 2023-04-03 NOTE — Group Note (Signed)
Date:  04/03/2023 Time:  10:28 AM  Group Topic/Focus:  Making Healthy Choices:   The focus of this group is to help patients identify negative/unhealthy choices they were using prior to admission and identify positive/healthier coping strategies to replace them upon discharge.    Participation Level:  Did Not Attend    [name] 04/03/2023, 10:28 AM

## 2023-04-03 NOTE — Plan of Care (Signed)
  Problem: Education: Goal: Knowledge of General Education information will improve Description Including pain rating scale, medication(s)/side effects and non-pharmacologic comfort measures Outcome: Progressing   Problem: Health Behavior/Discharge Planning: Goal: Ability to manage health-related needs will improve Outcome: Progressing   

## 2023-04-03 NOTE — Progress Notes (Signed)
Pt is a voluntary admission to [name] for insomnia related to Schizophrenia - hearing voices. Patient is calm and cooperative but does tend to isolate in his room or sit by himself in the dayroom. Ambulates well and completes his own ADL's. Denies S/I, H/I, and AVH.  Will continue to monitor. Is suppose to follow up with... oncology? Doctor regarding stage 3 lung ca and find out his treatment plan. Will continue to monitor.

## 2023-04-03 NOTE — Progress Notes (Signed)
   04/03/23 2100  Psych Admission Type (Psych Patients Only)  Admission Status Voluntary  Psychosocial Assessment  Patient Complaints None  Eye Contact Fair  Facial Expression Flat  Affect Appropriate to circumstance  Speech Logical/coherent  Interaction Minimal  Motor Activity Slow  Appearance/Hygiene In scrubs  Behavior Characteristics Cooperative  Mood Pleasant  Thought Process  Coherency WDL  Content WDL  Delusions None reported or observed  Perception WDL  Hallucination None reported or observed  Judgment Impaired  Confusion None  Danger to Self  Current suicidal ideation? Denies  Agreement Not to Harm Self Yes  Description of Agreement verbal  Danger to Others  Danger to Others None reported or observed

## 2023-04-03 NOTE — Progress Notes (Signed)
[facility] MD Progress Note  04/03/2023 2:36 PM [name]  MRN:  [id number] Subjective: [name] is seen on rounds.  He states he is doing well.  He has been interacting well with staff and peers.  He has been compliant with medications.  He has been in good controls.  He denies any side effects from his medication.  His depression has improved and he is not suicidal. Principal Problem: Schizophrenia (HCC) Diagnosis: Principal Problem:   Schizophrenia (HCC)  Total Time spent with patient: 15 minutes  Past Psychiatric History: Schizophrenia  Past Medical History:  Past Medical History:  Diagnosis Date   Acute on chronic respiratory failure with hypoxia (HCC)    Asthma    Coagulation disorder (HCC)    COPD (chronic obstructive pulmonary disease) (HCC)    Depression    History of hiatal hernia    Hypertension    Hypokalemia    Leukocytosis    Lung mass    Pneumonia    Schizophrenia (HCC)    Shortness of breath dyspnea    Stroke [facility])    Umbilical hernia    Ventral hernia     Past Surgical History:  Procedure Laterality Date   COLONOSCOPY WITH PROPOFOL N/A 09/21/2021   Procedure: COLONOSCOPY WITH PROPOFOL;  Surgeon: Regis Bill, MD;  Location: [facility] ENDOSCOPY;  Service: Endoscopy;  Laterality: N/A;   HERNIA REPAIR     INSERTION OF MESH N/A 12/18/2014   Procedure: INSERTION OF MESH;  Surgeon: Lattie Haw, MD;  Location: [facility] ORS;  Service: General;  Laterality: N/A;   SUPRA-UMBILICAL HERNIA  12/18/2014   Procedure: SUPRA-UMBILICAL HERNIA;  Surgeon: Lattie Haw, MD;  Location: [facility] ORS;  Service: General;;   UMBILICAL HERNIA REPAIR N/A 12/18/2014   Procedure: HERNIA REPAIR UMBILICAL ADULT;  Surgeon: Lattie Haw, MD;  Location: [facility] ORS;  Service: General;  Laterality: N/A;   VIDEO BRONCHOSCOPY WITH ENDOBRONCHIAL ULTRASOUND N/A 03/15/2023   Procedure: VIDEO BRONCHOSCOPY WITH ENDOBRONCHIAL ULTRASOUND;  Surgeon: Vida Rigger, MD;  Location: [facility] ORS;  Service: Thoracic;   Laterality: N/A;   Family History:  Family History  Problem Relation Age of Onset   Asthma Mother    Hypertension Father    Family Psychiatric  History: Unremarkable Social History:  Social History   Substance and Sexual Activity  Alcohol Use Not Currently   Alcohol/week: 75.0 standard drinks of alcohol   Types: 75 Cans of beer per week     Social History   Substance and Sexual Activity  Drug Use Not Currently   Types: Marijuana, "Crack" cocaine   Comment: none in 15 yrs    Social History   Socioeconomic History   Marital status: Widowed    Spouse name: Not on file   Number of children: Not on file   Years of education: Not on file   Highest education level: Not on file  Occupational History   Not on file  Tobacco Use   Smoking status: Every Day    Current packs/day: 0.15    Average packs/day: 0.2 packs/day for 45.0 years (6.8 ttl pk-yrs)    Types: Cigarettes   Smokeless tobacco: Never  Vaping Use   Vaping status: Never Used  Substance and Sexual Activity   Alcohol use: Not Currently    Alcohol/week: 75.0 standard drinks of alcohol    Types: 75 Cans of beer per week   Drug use: Not Currently    Types: Marijuana, "Crack" cocaine    Comment: none in 15  yrs   Sexual activity: Not Currently  Other Topics Concern   Not on file  Social History Narrative   Not on file   Social Determinants of Health   Financial Resource Strain: Not on file  Food Insecurity: No Food Insecurity (03/25/2023)   Hunger Vital Sign    Worried About Running Out of Food in the Last Year: Never true    Ran Out of Food in the Last Year: Never true  Transportation Needs: No Transportation Needs (03/25/2023)   PRAPARE - Administrator, Civil Service (Medical): No    Lack of Transportation (Non-Medical): No  Physical Activity: Not on file  Stress: Not on file  Social Connections: Not on file   Additional Social History:                         Sleep:  Good  Appetite:  Good  Current Medications: Current Facility-Administered Medications  Medication Dose Route Frequency Provider Last Rate Last Admin   acetaminophen (TYLENOL) tablet 650 mg  650 mg Oral Q6H PRN Ajibola, Ene A, NP       albuterol (VENTOLIN HFA) 108 (90 Base) MCG/ACT inhaler 2 puff  2 puff Inhalation Q4H PRN Sarina Ill, DO   2 puff at 03/28/23 1542   alum & mag hydroxide-simeth (MAALOX/MYLANTA) 200-200-20 MG/5ML suspension 30 mL  30 mL Oral Q4H PRN Ajibola, Ene A, NP       aspirin EC tablet 81 mg  81 mg Oral Daily Lewanda Rife, MD   81 mg at 04/03/23 0946   clonazePAM (KLONOPIN) tablet 1 mg  1 mg Oral QHS Lewanda Rife, MD   1 mg at 04/02/23 2108   clopidogrel (PLAVIX) tablet 75 mg  75 mg Oral Daily Lewanda Rife, MD   75 mg at 04/03/23 0945   diltiazem (CARDIZEM CD) 24 hr capsule 180 mg  180 mg Oral Daily Lewanda Rife, MD   180 mg at 04/03/23 0945   diphenhydrAMINE (BENADRYL) capsule 50 mg  50 mg Oral TID PRN Ajibola, Ene A, NP   50 mg at 03/25/23 2125   Or   diphenhydrAMINE (BENADRYL) injection 50 mg  50 mg Intramuscular TID PRN Ajibola, Ene A, NP       famotidine (PEPCID) tablet 20 mg  20 mg Oral Daily Marval Regal, Meenakshi, MD   20 mg at 04/03/23 0946   fluticasone furoate-vilanterol (BREO ELLIPTA) 100-25 MCG/ACT 1 puff  1 puff Inhalation Daily Lewanda Rife, MD   1 puff at 04/03/23 0945   haloperidol (HALDOL) tablet 5 mg  5 mg Oral TID PRN Ajibola, Ene A, NP   5 mg at 03/25/23 2125   Or   haloperidol lactate (HALDOL) injection 5 mg  5 mg Intramuscular TID PRN Ajibola, Ene A, NP       hydrOXYzine (ATARAX) tablet 25 mg  25 mg Oral TID PRN Ajibola, Ene A, NP   25 mg at 03/27/23 2303   ipratropium-albuterol (DUONEB) 0.5-2.5 (3) MG/3ML nebulizer solution 3 mL  3 mL Nebulization Q6H PRN Lewanda Rife, MD   3 mL at 03/28/23 1541   LORazepam (ATIVAN) tablet 2 mg  2 mg Oral TID PRN Ajibola, Ene A, NP   2 mg at 03/27/23 0404   Or   LORazepam  (ATIVAN) injection 2 mg  2 mg Intramuscular TID PRN Ajibola, Ene A, NP       magnesium hydroxide (MILK OF MAGNESIA) suspension 30 mL  30 mL  Oral Daily PRN Ajibola, Ene A, NP       mirtazapine (REMERON) tablet 30 mg  30 mg Oral QHS Lewanda Rife, MD   30 mg at 04/02/23 2108   montelukast (SINGULAIR) tablet 10 mg  10 mg Oral QHS Lewanda Rife, MD   10 mg at 04/02/23 2108   nicotine (NICODERM CQ - dosed in mg/24 hours) patch 14 mg  14 mg Transdermal Daily Lewanda Rife, MD   14 mg at 04/03/23 0946   paliperidone (INVEGA) 24 hr tablet 9 mg  9 mg Oral QHS Lewanda Rife, MD   9 mg at 04/02/23 2108   traZODone (DESYREL) tablet 150 mg  150 mg Oral QHS PRN Sarina Ill, DO   150 mg at 04/02/23 2116    Lab Results: No results found for this or any previous visit (from the past 48 hour(s)).  Blood Alcohol level:  Lab Results  Component Value Date   ETH <10 03/24/2023    Metabolic Disorder Labs: Lab Results  Component Value Date   HGBA1C 5.8 (H) 03/26/2023   MPG 119.76 03/26/2023   No results found for: "PROLACTIN" Lab Results  Component Value Date   CHOL 108 03/26/2023   TRIG 72 03/26/2023   HDL 43 03/26/2023   CHOLHDL 2.5 03/26/2023   VLDL 14 03/26/2023   LDLCALC 51 03/26/2023    Physical Findings: AIMS:  , ,  ,  ,    CIWA:    COWS:     Musculoskeletal: Strength & Muscle Tone: within normal limits Gait & Station: normal Patient leans: N/A  Psychiatric Specialty Exam:  Presentation  General Appearance:  Appropriate for Environment  Eye Contact: Fair  Speech: Clear and Coherent  Speech Volume: Normal  Handedness:No data recorded  Mood and Affect  Mood: Anxious; Depressed  Affect: Constricted   Thought Process  Thought Processes: Coherent; Goal Directed  Descriptions of Associations:Intact  Orientation:Full (Time, Place and Person)  Thought Content:Abstract Reasoning  History of Schizophrenia/Schizoaffective  disorder:Yes  Duration of Psychotic Symptoms:Greater than six months  Hallucinations:No data recorded Ideas of Reference:None  Suicidal Thoughts:No data recorded Homicidal Thoughts:No data recorded  Sensorium  Memory: Immediate Fair  Judgment: Fair  Insight: Fair   Art therapist  Concentration: Fair  Attention Span: Fair  Recall:No data recorded Fund of Knowledge:No data recorded Language: Fair   Psychomotor Activity  Psychomotor Activity:No data recorded  Assets  Assets: Manufacturing systems engineer; Desire for Improvement; Housing   Sleep  Sleep:No data recorded   Blood pressure 115/77, pulse 92, temperature 98 F (36.7 C), resp. rate 16, height 6\' 1"  (1.854 m), weight 98.4 kg, SpO2 95%. Body mass index is 28.63 kg/m.   Treatment Plan Summary: Daily contact with patient to assess and evaluate symptoms and progress in treatment, Medication management, and Plan continue current medications.  Sarina Ill, DO 04/03/2023, 2:36 PM

## 2023-04-03 NOTE — Group Note (Signed)
Date:  04/03/2023 Time:  9:26 PM  Group Topic/Focus:  Self Care:   The focus of this group is to help patients understand the importance of self-care in order to improve or restore emotional, physical, spiritual, interpersonal, and financial health.    Participation Level:  Active  Participation Quality:  Appropriate  Affect:  Appropriate  Cognitive:  Appropriate  Insight: Good  Engagement in Group:  Engaged  Modes of Intervention:  Discussion  Additional Comments:    Maeola Harman 04/03/2023, 9:26 PM

## 2023-04-03 NOTE — Plan of Care (Signed)

## 2023-04-03 NOTE — Progress Notes (Signed)
Patient was cooperative on shift, he was visible in the dayroom during the evening, he was compliant with medications, no new behavioral issues to report on shift at this time. He remains sad and depressed.

## 2023-04-04 ENCOUNTER — Ambulatory Visit: Payer: Medicare Other | Attending: Radiation Oncology

## 2023-04-04 DIAGNOSIS — C3412 Malignant neoplasm of upper lobe, left bronchus or lung: Secondary | ICD-10-CM | POA: Insufficient documentation

## 2023-04-04 DIAGNOSIS — F201 Disorganized schizophrenia: Secondary | ICD-10-CM | POA: Diagnosis not present

## 2023-04-04 NOTE — Progress Notes (Signed)
Pharmacist Chemotherapy Monitoring - Initial Assessment    Anticipated start date: 04/12/23   The following has been reviewed per standard work regarding the patient's treatment regimen: The patient's diagnosis, treatment plan and drug doses, and organ/hematologic function Lab orders and baseline tests specific to treatment regimen  The treatment plan start date, drug sequencing, and pre-medications Prior authorization status  Patient's documented medication list, including drug-drug interaction screen and prescriptions for anti-emetics and supportive care specific to the treatment regimen The drug concentrations, fluid compatibility, administration routes, and timing of the medications to be used The patient's access for treatment and lifetime cumulative dose history, if applicable  The patient's medication allergies and previous infusion related reactions, if applicable   Changes made to treatment plan:  N/A  Follow up needed:  prescriptions needed for anti-emetics   Ebony Hail, Pharm.D., CPP 04/04/2023@4 :37 PM

## 2023-04-04 NOTE — Progress Notes (Signed)
Patient admitted voluntarily on March 25, 2023 for complaints of insomnia mixed with AH. Diagnosis of schizophrenia.   Patient denies SI/HI/AVH. He denies depression and anxiety. Medications adm whole without difficulty. Patient prefers to isolate in his room or occasionally sit in the back of the dayroom but will appear for meals. He is pleasant however and is able to make his needs known.  Q15 minute unit checks in place.

## 2023-04-04 NOTE — Group Note (Signed)
Date:  04/04/2023 Time:  10:18 PM  Group Topic/Focus:  Goals Group:   The focus of this group is to help patients establish daily goals to achieve during treatment and discuss how the patient can incorporate goal setting into their daily lives to aide in recovery.    Participation Level:  Active  Participation Quality:  Appropriate  Affect:  Appropriate  Cognitive:  Appropriate  Insight: Appropriate  Engagement in Group:  Engaged  Modes of Intervention:  Discussion  Additional Comments:    Maeola Harman 04/04/2023, 10:18 PM

## 2023-04-04 NOTE — Plan of Care (Signed)
  Problem: Pain Managment: Goal: General experience of comfort will improve Outcome: Progressing   Problem: Safety: Goal: Ability to remain free from injury will improve Outcome: Progressing   Problem: Skin Integrity: Goal: Risk for impaired skin integrity will decrease Outcome: Progressing   

## 2023-04-04 NOTE — Group Note (Signed)
Date:  04/04/2023 Time:  10:31 AM  Group Topic/Focus:  Activity Group:  The focus of this group is to promote activity for the patients to allow them to get some fresh air and also allow them to exercise.     Participation Level:  Did Not Attend   Mary Sella Sharlett Lienemann 04/04/2023, 10:31 AM

## 2023-04-04 NOTE — Group Note (Signed)

## 2023-04-04 NOTE — Progress Notes (Signed)
[facility] MD Progress Note  04/04/2023 1:12 PM Azi Totin Warne  MRN:  [id number] Subjective: [name] is seen on rounds.  His mood and affect have improved greatly and we talked about discharge on Friday.  He has been compliant with medications and no problems.  He denies any auditory hallucinations.  He states his depression is improved.  Nurses report no issues. Principal Problem: Schizophrenia (HCC) Diagnosis: Principal Problem:   Schizophrenia (HCC)  Total Time spent with patient: 15 minutes  Past Psychiatric History: Schizophrenia  Past Medical History:  Past Medical History:  Diagnosis Date   Acute on chronic respiratory failure with hypoxia (HCC)    Asthma    Coagulation disorder (HCC)    COPD (chronic obstructive pulmonary disease) (HCC)    Depression    History of hiatal hernia    Hypertension    Hypokalemia    Leukocytosis    Lung mass    Pneumonia    Schizophrenia (HCC)    Shortness of breath dyspnea    Stroke [facility])    Umbilical hernia    Ventral hernia     Past Surgical History:  Procedure Laterality Date   COLONOSCOPY WITH PROPOFOL N/A 09/21/2021   Procedure: COLONOSCOPY WITH PROPOFOL;  Surgeon: Regis Bill, MD;  Location: [facility] ENDOSCOPY;  Service: Endoscopy;  Laterality: N/A;   HERNIA REPAIR     INSERTION OF MESH N/A 12/18/2014   Procedure: INSERTION OF MESH;  Surgeon: Lattie Haw, MD;  Location: [facility] ORS;  Service: General;  Laterality: N/A;   SUPRA-UMBILICAL HERNIA  12/18/2014   Procedure: SUPRA-UMBILICAL HERNIA;  Surgeon: Lattie Haw, MD;  Location: [facility] ORS;  Service: General;;   UMBILICAL HERNIA REPAIR N/A 12/18/2014   Procedure: HERNIA REPAIR UMBILICAL ADULT;  Surgeon: Lattie Haw, MD;  Location: [facility] ORS;  Service: General;  Laterality: N/A;   VIDEO BRONCHOSCOPY WITH ENDOBRONCHIAL ULTRASOUND N/A 03/15/2023   Procedure: VIDEO BRONCHOSCOPY WITH ENDOBRONCHIAL ULTRASOUND;  Surgeon: Vida Rigger, MD;  Location: [facility] ORS;  Service: Thoracic;   Laterality: N/A;   Family History:  Family History  Problem Relation Age of Onset   Asthma Mother    Hypertension Father    Family Psychiatric  History: Unremarkable Social History:  Social History   Substance and Sexual Activity  Alcohol Use Not Currently   Alcohol/week: 75.0 standard drinks of alcohol   Types: 75 Cans of beer per week     Social History   Substance and Sexual Activity  Drug Use Not Currently   Types: Marijuana, "Crack" cocaine   Comment: none in 15 yrs    Social History   Socioeconomic History   Marital status: Widowed    Spouse name: Not on file   Number of children: Not on file   Years of education: Not on file   Highest education level: Not on file  Occupational History   Not on file  Tobacco Use   Smoking status: Every Day    Current packs/day: 0.15    Average packs/day: 0.2 packs/day for 45.0 years (6.8 ttl pk-yrs)    Types: Cigarettes   Smokeless tobacco: Never  Vaping Use   Vaping status: Never Used  Substance and Sexual Activity   Alcohol use: Not Currently    Alcohol/week: 75.0 standard drinks of alcohol    Types: 75 Cans of beer per week   Drug use: Not Currently    Types: Marijuana, "Crack" cocaine    Comment: none in 15 yrs   Sexual activity: Not Currently  Other Topics Concern   Not on file  Social History Narrative   Not on file   Social Determinants of Health   Financial Resource Strain: Not on file  Food Insecurity: No Food Insecurity (03/25/2023)   Hunger Vital Sign    Worried About Running Out of Food in the Last Year: Never true    Ran Out of Food in the Last Year: Never true  Transportation Needs: No Transportation Needs (03/25/2023)   PRAPARE - Administrator, Civil Service (Medical): No    Lack of Transportation (Non-Medical): No  Physical Activity: Not on file  Stress: Not on file  Social Connections: Not on file   Additional Social History:                         Sleep:  Good  Appetite:  Good  Current Medications: Current Facility-Administered Medications  Medication Dose Route Frequency Provider Last Rate Last Admin   acetaminophen (TYLENOL) tablet 650 mg  650 mg Oral Q6H PRN Ajibola, Ene A, NP       albuterol (VENTOLIN HFA) 108 (90 Base) MCG/ACT inhaler 2 puff  2 puff Inhalation Q4H PRN Sarina Ill, DO   2 puff at 03/28/23 1542   alum & mag hydroxide-simeth (MAALOX/MYLANTA) 200-200-20 MG/5ML suspension 30 mL  30 mL Oral Q4H PRN Ajibola, Ene A, NP       aspirin EC tablet 81 mg  81 mg Oral Daily Lewanda Rife, MD   81 mg at 04/04/23 0945   clonazePAM (KLONOPIN) tablet 1 mg  1 mg Oral QHS Lewanda Rife, MD   1 mg at 04/03/23 2148   clopidogrel (PLAVIX) tablet 75 mg  75 mg Oral Daily Lewanda Rife, MD   75 mg at 04/04/23 0944   diltiazem (CARDIZEM CD) 24 hr capsule 180 mg  180 mg Oral Daily Lewanda Rife, MD   180 mg at 04/04/23 0945   diphenhydrAMINE (BENADRYL) capsule 50 mg  50 mg Oral TID PRN Ajibola, Ene A, NP   50 mg at 03/25/23 2125   Or   diphenhydrAMINE (BENADRYL) injection 50 mg  50 mg Intramuscular TID PRN Ajibola, Ene A, NP       famotidine (PEPCID) tablet 20 mg  20 mg Oral Daily Marval Regal, Meenakshi, MD   20 mg at 04/04/23 0945   fluticasone furoate-vilanterol (BREO ELLIPTA) 100-25 MCG/ACT 1 puff  1 puff Inhalation Daily Lewanda Rife, MD   1 puff at 04/04/23 0946   haloperidol (HALDOL) tablet 5 mg  5 mg Oral TID PRN Ajibola, Ene A, NP   5 mg at 03/25/23 2125   Or   haloperidol lactate (HALDOL) injection 5 mg  5 mg Intramuscular TID PRN Ajibola, Ene A, NP       hydrOXYzine (ATARAX) tablet 25 mg  25 mg Oral TID PRN Ajibola, Ene A, NP   25 mg at 03/27/23 2303   ipratropium-albuterol (DUONEB) 0.5-2.5 (3) MG/3ML nebulizer solution 3 mL  3 mL Nebulization Q6H PRN Lewanda Rife, MD   3 mL at 03/28/23 1541   LORazepam (ATIVAN) tablet 2 mg  2 mg Oral TID PRN Ajibola, Ene A, NP   2 mg at 03/27/23 0404   Or   LORazepam  (ATIVAN) injection 2 mg  2 mg Intramuscular TID PRN Ajibola, Ene A, NP       magnesium hydroxide (MILK OF MAGNESIA) suspension 30 mL  30 mL Oral Daily PRN Ajibola, Ene A, NP  mirtazapine (REMERON) tablet 30 mg  30 mg Oral QHS Lewanda Rife, MD   30 mg at 04/03/23 2149   montelukast (SINGULAIR) tablet 10 mg  10 mg Oral QHS Lewanda Rife, MD   10 mg at 04/03/23 2148   nicotine (NICODERM CQ - dosed in mg/24 hours) patch 14 mg  14 mg Transdermal Daily Lewanda Rife, MD   14 mg at 04/04/23 0945   paliperidone (INVEGA) 24 hr tablet 9 mg  9 mg Oral QHS Lewanda Rife, MD   9 mg at 04/03/23 2147   traZODone (DESYREL) tablet 150 mg  150 mg Oral QHS PRN Sarina Ill, DO   150 mg at 04/03/23 2153    Lab Results: No results found for this or any previous visit (from the past 48 hour(s)).  Blood Alcohol level:  Lab Results  Component Value Date   ETH <10 03/24/2023    Metabolic Disorder Labs: Lab Results  Component Value Date   HGBA1C 5.8 (H) 03/26/2023   MPG 119.76 03/26/2023   No results found for: "PROLACTIN" Lab Results  Component Value Date   CHOL 108 03/26/2023   TRIG 72 03/26/2023   HDL 43 03/26/2023   CHOLHDL 2.5 03/26/2023   VLDL 14 03/26/2023   LDLCALC 51 03/26/2023    Physical Findings: AIMS:  , ,  ,  ,    CIWA:    COWS:     Musculoskeletal: Strength & Muscle Tone: within normal limits Gait & Station: normal Patient leans: N/A  Psychiatric Specialty Exam:  Presentation  General Appearance:  Appropriate for Environment  Eye Contact: Fair  Speech: Clear and Coherent  Speech Volume: Normal  Handedness:No data recorded  Mood and Affect  Mood: Anxious; Depressed  Affect: Constricted   Thought Process  Thought Processes: Coherent; Goal Directed  Descriptions of Associations:Intact  Orientation:Full (Time, Place and Person)  Thought Content:Abstract Reasoning  History of Schizophrenia/Schizoaffective  disorder:Yes  Duration of Psychotic Symptoms:Greater than six months  Hallucinations:No data recorded Ideas of Reference:None  Suicidal Thoughts:No data recorded Homicidal Thoughts:No data recorded  Sensorium  Memory: Immediate Fair  Judgment: Fair  Insight: Fair   Art therapist  Concentration: Fair  Attention Span: Fair  Recall:No data recorded Fund of Knowledge:No data recorded Language: Fair   Psychomotor Activity  Psychomotor Activity:No data recorded  Assets  Assets: Manufacturing systems engineer; Desire for Improvement; Housing   Sleep  Sleep:No data recorded  Blood pressure 128/67, pulse 96, temperature 98.2 F (36.8 C), resp. rate 14, height 6\' 1"  (1.854 m), weight 98.4 kg, SpO2 96%. Body mass index is 28.63 kg/m.   Treatment Plan Summary: Daily contact with patient to assess and evaluate symptoms and progress in treatment, Medication management, and Plan continue current medications.  Kleigh Hoelzer Tresea Mall, DO 04/04/2023, 1:12 PM

## 2023-04-04 NOTE — Group Note (Signed)
Recreation Therapy Group Note   Group Topic:Coping Skills  Group Date: 04/04/2023 Start Time: 1400 End Time: 1445 Facilitators: Rosina Lowenstein, LRT, CTRS Location:  Day Room  Group Description: Mind Map.  Patient was provided a blank template of a diagram with 32 blank boxes in a tiered system, branching from the center (similar to a bubble chart). LRT directed patients to label the middle of the diagram "Coping Skills". LRT and patients then came up with 8 different coping skills as examples. Pt were directed to record their coping skills in the 2nd tier boxes closest to the center.  Patients would then share their coping skills with the group as LRT wrote them out. LRT gave a handout of 99 different coping skills at the end of group.    Goal Area(s) Addressed: Patients will be able to define "coping skills". Patient will identify new coping skills.  Patient will increase communication.   Affect/Mood: N/A   Participation Level: Did not attend    Clinical Observations/Individualized Feedback: Mikale did not attend group.  Plan: Continue to engage patient in RT group sessions 2-3x/week.   [id number] LRT, CTRS 04/04/2023 3:14 PM

## 2023-04-05 ENCOUNTER — Other Ambulatory Visit: Payer: Self-pay | Admitting: Oncology

## 2023-04-05 ENCOUNTER — Encounter: Payer: Self-pay | Admitting: Oncology

## 2023-04-05 DIAGNOSIS — F201 Disorganized schizophrenia: Secondary | ICD-10-CM | POA: Diagnosis not present

## 2023-04-05 DIAGNOSIS — C3492 Malignant neoplasm of unspecified part of left bronchus or lung: Secondary | ICD-10-CM

## 2023-04-05 MED ORDER — DILTIAZEM HCL ER COATED BEADS 240 MG PO CP24
240.0000 mg | ORAL_CAPSULE | Freq: Every day | ORAL | Status: DC
  Administered 2023-04-06 – 2023-04-07 (×2): 240 mg via ORAL
  Filled 2023-04-05 (×2): qty 1

## 2023-04-05 NOTE — Plan of Care (Signed)

## 2023-04-05 NOTE — Group Note (Signed)
Recreation Therapy Group Note   Group Topic:Coping Skills  Group Date: 04/05/2023 Start Time: 0930 End Time: 1020 Facilitators: Rosina Lowenstein, LRT, CTRS Location: Courtyard  Group Description: Music. Patients encouraged to name their favorite song(s) for LRT to play song through speaker for group to hear. Patient educated on the definition of leisure and the importance of having different leisure interests outside of the hospital. Group discussed how leisure activities can often be used as Pharmacologist and that listening to music is one example.   Goal Area(s) Addressed:  Patient will identify a current leisure interest.  Patient will practice making a positive decision. Patient will have the opportunity to try a new leisure activity.   Affect/Mood: N/A   Participation Level: Did not attend    Clinical Observations/Individualized Feedback: Markeal did not attend group.  Plan: Continue to engage patient in RT group sessions 2-3x/week.   Rosina Lowenstein, LRT, CTRS 04/05/2023 12:28 PM

## 2023-04-05 NOTE — Progress Notes (Signed)
   04/04/23 2000  Psych Admission Type (Psych Patients Only)  Admission Status Voluntary  Psychosocial Assessment  Patient Complaints None  Eye Contact Fair  Facial Expression Flat  Affect Appropriate to circumstance  Speech Logical/coherent  Interaction Minimal  Motor Activity Slow  Appearance/Hygiene In scrubs  Behavior Characteristics Cooperative  Mood Pleasant  Thought Process  Coherency WDL  Content WDL  Delusions None reported or observed  Perception WDL  Hallucination None reported or observed  Judgment Impaired  Confusion None  Danger to Self  Current suicidal ideation? Denies  Agreement Not to Harm Self Yes  Description of Agreement Verbal  Danger to Others  Danger to Others None reported or observed

## 2023-04-05 NOTE — Progress Notes (Signed)
Patient admitted voluntarily on March 25, 2023 for complaints of insomnia mixed with AH. Diagnosis of schizophrenia.   He denies depression and anxiety. He denies SI/HI/AVH. No distress noted or voiced. Patient showed positive interaction with staff and other patients while watching an evening movie.  Discharge expected Friday, April 07, 2023.

## 2023-04-05 NOTE — Group Note (Signed)
Date:  04/05/2023 Time:  8:25 PM  Group Topic/Focus:  Goals Group:   The focus of this group is to help patients establish daily goals to achieve during treatment and discuss how the patient can incorporate goal setting into their daily lives to aide in recovery.    Participation Level:  Active  Participation Quality:  Appropriate  Affect:  Appropriate  Cognitive:  Appropriate  Insight: Appropriate  Engagement in Group:  Engaged  Modes of Intervention:  Discussion  Additional Comments:   Burt Ek 04/05/2023, 8:25 PM

## 2023-04-05 NOTE — Progress Notes (Signed)
[facility] MD Progress Note  04/05/2023 1:43 PM [name]  MRN:  [id number] Subjective: [name] is seen on rounds.  He continues to do well.  We talked about discharge on Friday and he states that he feels well enough.  I let social work know because he lives in a group home.  No side effects from his medications.  Nurses report no issues. Principal Problem: Schizophrenia (HCC) Diagnosis: Principal Problem:   Schizophrenia (HCC)  Total Time spent with patient: 15 minutes  Past Psychiatric History: Schizophrenia  Past Medical History:  Past Medical History:  Diagnosis Date   Acute on chronic respiratory failure with hypoxia (HCC)    Asthma    Coagulation disorder (HCC)    COPD (chronic obstructive pulmonary disease) (HCC)    Depression    History of hiatal hernia    Hypertension    Hypokalemia    Leukocytosis    Lung mass    Pneumonia    Schizophrenia (HCC)    Shortness of breath dyspnea    Stroke [facility])    Umbilical hernia    Ventral hernia     Past Surgical History:  Procedure Laterality Date   COLONOSCOPY WITH PROPOFOL N/A 09/21/2021   Procedure: COLONOSCOPY WITH PROPOFOL;  Surgeon: Regis Bill, MD;  Location: [facility] ENDOSCOPY;  Service: Endoscopy;  Laterality: N/A;   HERNIA REPAIR     INSERTION OF MESH N/A 12/18/2014   Procedure: INSERTION OF MESH;  Surgeon: Lattie Haw, MD;  Location: [facility] ORS;  Service: General;  Laterality: N/A;   SUPRA-UMBILICAL HERNIA  12/18/2014   Procedure: SUPRA-UMBILICAL HERNIA;  Surgeon: Lattie Haw, MD;  Location: [facility] ORS;  Service: General;;   UMBILICAL HERNIA REPAIR N/A 12/18/2014   Procedure: HERNIA REPAIR UMBILICAL ADULT;  Surgeon: Lattie Haw, MD;  Location: [facility] ORS;  Service: General;  Laterality: N/A;   VIDEO BRONCHOSCOPY WITH ENDOBRONCHIAL ULTRASOUND N/A 03/15/2023   Procedure: VIDEO BRONCHOSCOPY WITH ENDOBRONCHIAL ULTRASOUND;  Surgeon: Vida Rigger, MD;  Location: [facility] ORS;  Service: Thoracic;  Laterality: N/A;    Family History:  Family History  Problem Relation Age of Onset   Asthma Mother    Hypertension Father    Family Psychiatric  History: Unremarkable Social History:  Social History   Substance and Sexual Activity  Alcohol Use Not Currently   Alcohol/week: 75.0 standard drinks of alcohol   Types: 75 Cans of beer per week     Social History   Substance and Sexual Activity  Drug Use Not Currently   Types: Marijuana, "Crack" cocaine   Comment: none in 15 yrs    Social History   Socioeconomic History   Marital status: Widowed    Spouse name: Not on file   Number of children: Not on file   Years of education: Not on file   Highest education level: Not on file  Occupational History   Not on file  Tobacco Use   Smoking status: Every Day    Current packs/day: 0.15    Average packs/day: 0.2 packs/day for 45.0 years (6.8 ttl pk-yrs)    Types: Cigarettes   Smokeless tobacco: Never  Vaping Use   Vaping status: Never Used  Substance and Sexual Activity   Alcohol use: Not Currently    Alcohol/week: 75.0 standard drinks of alcohol    Types: 75 Cans of beer per week   Drug use: Not Currently    Types: Marijuana, "Crack" cocaine    Comment: none in 15 yrs   Sexual  activity: Not Currently  Other Topics Concern   Not on file  Social History Narrative   Not on file   Social Determinants of Health   Financial Resource Strain: Not on file  Food Insecurity: No Food Insecurity (03/25/2023)   Hunger Vital Sign    Worried About Running Out of Food in the Last Year: Never true    Ran Out of Food in the Last Year: Never true  Transportation Needs: No Transportation Needs (03/25/2023)   PRAPARE - Administrator, Civil Service (Medical): No    Lack of Transportation (Non-Medical): No  Physical Activity: Not on file  Stress: Not on file  Social Connections: Not on file   Additional Social History:                         Sleep: Good  Appetite:   Good  Current Medications: Current Facility-Administered Medications  Medication Dose Route Frequency Provider Last Rate Last Admin   acetaminophen (TYLENOL) tablet 650 mg  650 mg Oral Q6H PRN Ajibola, Ene A, NP       albuterol (VENTOLIN HFA) 108 (90 Base) MCG/ACT inhaler 2 puff  2 puff Inhalation Q4H PRN Sarina Ill, DO   2 puff at 03/28/23 1542   alum & mag hydroxide-simeth (MAALOX/MYLANTA) 200-200-20 MG/5ML suspension 30 mL  30 mL Oral Q4H PRN Ajibola, Ene A, NP       aspirin EC tablet 81 mg  81 mg Oral Daily Lewanda Rife, MD   81 mg at 04/05/23 0915   clonazePAM (KLONOPIN) tablet 1 mg  1 mg Oral QHS Lewanda Rife, MD   1 mg at 04/04/23 2153   clopidogrel (PLAVIX) tablet 75 mg  75 mg Oral Daily Lewanda Rife, MD   75 mg at 04/05/23 0915   diltiazem (CARDIZEM CD) 24 hr capsule 180 mg  180 mg Oral Daily Lewanda Rife, MD   180 mg at 04/05/23 0914   diphenhydrAMINE (BENADRYL) capsule 50 mg  50 mg Oral TID PRN Ajibola, Ene A, NP   50 mg at 03/25/23 2125   Or   diphenhydrAMINE (BENADRYL) injection 50 mg  50 mg Intramuscular TID PRN Ajibola, Ene A, NP       famotidine (PEPCID) tablet 20 mg  20 mg Oral Daily Marval Regal, Meenakshi, MD   20 mg at 04/05/23 0915   fluticasone furoate-vilanterol (BREO ELLIPTA) 100-25 MCG/ACT 1 puff  1 puff Inhalation Daily Lewanda Rife, MD   1 puff at 04/05/23 0918   haloperidol (HALDOL) tablet 5 mg  5 mg Oral TID PRN Ajibola, Ene A, NP   5 mg at 03/25/23 2125   Or   haloperidol lactate (HALDOL) injection 5 mg  5 mg Intramuscular TID PRN Ajibola, Ene A, NP       hydrOXYzine (ATARAX) tablet 25 mg  25 mg Oral TID PRN Ajibola, Ene A, NP   25 mg at 03/27/23 2303   ipratropium-albuterol (DUONEB) 0.5-2.5 (3) MG/3ML nebulizer solution 3 mL  3 mL Nebulization Q6H PRN Lewanda Rife, MD   3 mL at 03/28/23 1541   LORazepam (ATIVAN) tablet 2 mg  2 mg Oral TID PRN Ajibola, Ene A, NP   2 mg at 03/27/23 0404   Or   LORazepam (ATIVAN) injection 2 mg   2 mg Intramuscular TID PRN Ajibola, Ene A, NP       magnesium hydroxide (MILK OF MAGNESIA) suspension 30 mL  30 mL Oral Daily PRN Ajibola,  Ene A, NP       mirtazapine (REMERON) tablet 30 mg  30 mg Oral QHS Lewanda Rife, MD   30 mg at 04/04/23 2153   montelukast (SINGULAIR) tablet 10 mg  10 mg Oral QHS Lewanda Rife, MD   10 mg at 04/04/23 2153   nicotine (NICODERM CQ - dosed in mg/24 hours) patch 14 mg  14 mg Transdermal Daily Lewanda Rife, MD   14 mg at 04/05/23 0915   paliperidone (INVEGA) 24 hr tablet 9 mg  9 mg Oral QHS Lewanda Rife, MD   9 mg at 04/04/23 2152   traZODone (DESYREL) tablet 150 mg  150 mg Oral QHS PRN Sarina Ill, DO   150 mg at 04/04/23 2152    Lab Results: No results found for this or any previous visit (from the past 48 hour(s)).  Blood Alcohol level:  Lab Results  Component Value Date   ETH <10 03/24/2023    Metabolic Disorder Labs: Lab Results  Component Value Date   HGBA1C 5.8 (H) 03/26/2023   MPG 119.76 03/26/2023   No results found for: "PROLACTIN" Lab Results  Component Value Date   CHOL 108 03/26/2023   TRIG 72 03/26/2023   HDL 43 03/26/2023   CHOLHDL 2.5 03/26/2023   VLDL 14 03/26/2023   LDLCALC 51 03/26/2023    Physical Findings: AIMS:  , ,  ,  ,    CIWA:    COWS:     Musculoskeletal: Strength & Muscle Tone: within normal limits Gait & Station: normal Patient leans: N/A  Psychiatric Specialty Exam:  Presentation  General Appearance:  Appropriate for Environment  Eye Contact: Fair  Speech: Clear and Coherent  Speech Volume: Normal  Handedness:No data recorded  Mood and Affect  Mood: Anxious; Depressed  Affect: Constricted   Thought Process  Thought Processes: Coherent; Goal Directed  Descriptions of Associations:Intact  Orientation:Full (Time, Place and Person)  Thought Content:Abstract Reasoning  History of Schizophrenia/Schizoaffective disorder:Yes  Duration of Psychotic  Symptoms:Greater than six months  Hallucinations:No data recorded Ideas of Reference:None  Suicidal Thoughts:No data recorded Homicidal Thoughts:No data recorded  Sensorium  Memory: Immediate Fair  Judgment: Fair  Insight: Fair   Art therapist  Concentration: Fair  Attention Span: Fair  Recall:No data recorded Fund of Knowledge:No data recorded Language: Fair   Psychomotor Activity  Psychomotor Activity:No data recorded  Assets  Assets: Manufacturing systems engineer; Desire for Improvement; Housing   Sleep  Sleep:No data recorded    Blood pressure (!) 143/89, pulse (!) 105, temperature 97.8 F (36.6 C), resp. rate 18, height 6\' 1"  (1.854 m), weight 98.4 kg, SpO2 96%. Body mass index is 28.63 kg/m.   Treatment Plan Summary: Daily contact with patient to assess and evaluate symptoms and progress in treatment, Medication management, and Plan continue current medications.  Sarina Ill, DO 04/05/2023, 1:43 PM

## 2023-04-05 NOTE — Plan of Care (Signed)
  Problem: Activity: Goal: Risk for activity intolerance will decrease Outcome: Progressing   Problem: Nutrition: Goal: Adequate nutrition will be maintained Outcome: Progressing   Problem: Coping: Goal: Level of anxiety will decrease Outcome: Progressing   

## 2023-04-06 ENCOUNTER — Ambulatory Visit: Payer: Medicare Other

## 2023-04-06 DIAGNOSIS — F201 Disorganized schizophrenia: Secondary | ICD-10-CM | POA: Diagnosis not present

## 2023-04-06 NOTE — BH IP Treatment Plan (Signed)
Interdisciplinary Treatment and Diagnostic Plan Update  04/06/2023 Time of Session: 9:00 AM [name] MRN: [id number]  Principal Diagnosis: Schizophrenia [facility])  Secondary Diagnoses: Principal Problem:   Schizophrenia (HCC)   Current Medications:  Current Facility-Administered Medications  Medication Dose Route Frequency Provider Last Rate Last Admin   acetaminophen (TYLENOL) tablet 650 mg  650 mg Oral Q6H PRN Ajibola, Ene A, NP       albuterol (VENTOLIN HFA) 108 (90 Base) MCG/ACT inhaler 2 puff  2 puff Inhalation Q4H PRN Sarina Ill, DO   2 puff at 03/28/23 1542   alum & mag hydroxide-simeth (MAALOX/MYLANTA) 200-200-20 MG/5ML suspension 30 mL  30 mL Oral Q4H PRN Ajibola, Ene A, NP       aspirin EC tablet 81 mg  81 mg Oral Daily Lewanda Rife, MD   81 mg at 04/06/23 0915   clonazePAM (KLONOPIN) tablet 1 mg  1 mg Oral QHS Lewanda Rife, MD   1 mg at 04/05/23 2050   clopidogrel (PLAVIX) tablet 75 mg  75 mg Oral Daily Lewanda Rife, MD   75 mg at 04/06/23 0915   diltiazem (CARDIZEM CD) 24 hr capsule 240 mg  240 mg Oral Daily Sarina Ill, DO   240 mg at 04/06/23 1003   diphenhydrAMINE (BENADRYL) capsule 50 mg  50 mg Oral TID PRN Ajibola, Ene A, NP   50 mg at 03/25/23 2125   Or   diphenhydrAMINE (BENADRYL) injection 50 mg  50 mg Intramuscular TID PRN Ajibola, Ene A, NP       famotidine (PEPCID) tablet 20 mg  20 mg Oral Daily Lewanda Rife, MD   20 mg at 04/06/23 0914   fluticasone furoate-vilanterol (BREO ELLIPTA) 100-25 MCG/ACT 1 puff  1 puff Inhalation Daily Lewanda Rife, MD   1 puff at 04/06/23 0917   haloperidol (HALDOL) tablet 5 mg  5 mg Oral TID PRN Ajibola, Ene A, NP   5 mg at 03/25/23 2125   Or   haloperidol lactate (HALDOL) injection 5 mg  5 mg Intramuscular TID PRN Ajibola, Ene A, NP       hydrOXYzine (ATARAX) tablet 25 mg  25 mg Oral TID PRN Ajibola, Ene A, NP   25 mg at 03/27/23 2303   ipratropium-albuterol (DUONEB) 0.5-2.5 (3)  MG/3ML nebulizer solution 3 mL  3 mL Nebulization Q6H PRN Lewanda Rife, MD   3 mL at 03/28/23 1541   LORazepam (ATIVAN) tablet 2 mg  2 mg Oral TID PRN Ajibola, Ene A, NP   2 mg at 03/27/23 0404   Or   LORazepam (ATIVAN) injection 2 mg  2 mg Intramuscular TID PRN Ajibola, Ene A, NP       magnesium hydroxide (MILK OF MAGNESIA) suspension 30 mL  30 mL Oral Daily PRN Ajibola, Ene A, NP       mirtazapine (REMERON) tablet 30 mg  30 mg Oral QHS Marval Regal, Meenakshi, MD   30 mg at 04/05/23 2050   montelukast (SINGULAIR) tablet 10 mg  10 mg Oral QHS Lewanda Rife, MD   10 mg at 04/05/23 2050   nicotine (NICODERM CQ - dosed in mg/24 hours) patch 14 mg  14 mg Transdermal Daily Lewanda Rife, MD   14 mg at 04/06/23 0918   paliperidone (INVEGA) 24 hr tablet 9 mg  9 mg Oral QHS Lewanda Rife, MD   9 mg at 04/05/23 2050   traZODone (DESYREL) tablet 150 mg  150 mg Oral QHS PRN Sarina Ill, DO  150 mg at 04/05/23 2050   PTA Medications: Medications Prior to Admission  Medication Sig Dispense Refill Last Dose   acetaminophen (TYLENOL) 325 MG tablet Take 650 mg by mouth 2 (two) times daily as needed for mild pain.      albuterol (VENTOLIN HFA) 108 (90 Base) MCG/ACT inhaler Inhale 2 puffs into the lungs every 4 (four) hours as needed for wheezing or shortness of breath.      amantadine (SYMMETREL) 100 MG capsule Take 100 mg by mouth 2 (two) times daily.      aspirin EC 81 MG tablet Take 81 mg by mouth daily. Swallow whole.      clopidogrel (PLAVIX) 75 MG tablet Take 75 mg by mouth daily.      dextromethorphan-guaiFENesin (ROBITUSSIN-DM) 10-100 MG/5ML liquid Take 10 mLs by mouth every 6 (six) hours as needed for cough. (Patient not taking: Reported on 03/07/2023)      diltiazem (CARDIZEM CD) 180 MG 24 hr capsule Take 180 mg by mouth daily.       docusate sodium (COLACE) 100 MG capsule Take 100 mg by mouth 2 (two) times daily as needed for mild constipation.      famotidine (PEPCID) 20 MG  tablet Take 1 tablet (20 mg total) by mouth daily. 30 tablet 0    Fluticasone-Umeclidin-Vilant (TRELEGY ELLIPTA) 100-62.5-25 MCG/ACT AEPB Inhale 1 puff into the lungs daily. 28 each 1    gabapentin (NEURONTIN) 100 MG capsule Take 100 mg by mouth at bedtime.       guaiFENesin (MUCINEX) 600 MG 12 hr tablet Take 600 mg by mouth 2 (two) times daily as needed.      hydrochlorothiazide (HYDRODIURIL) 12.5 MG tablet Take 12.5 mg by mouth daily.      INVEGA TRINZA 819 MG/2.625ML SUSY Inject 819 mg into the muscle every 3 (three) months.       ipratropium-albuterol (DUONEB) 0.5-2.5 (3) MG/3ML SOLN Take 3 mLs by nebulization every 6 (six) hours as needed (every 4 to 6 hours as needed for shortnes of breath or wheeziing).       ketoconazole (NIZORAL) 2 % cream Apply to both feet and between toes once daily for 6 weeks. 60 g 1    lovastatin (MEVACOR) 20 MG tablet Take 20 mg by mouth at bedtime.      Melatonin 5 MG TABS Take 10 mg by mouth at bedtime.      mirtazapine (REMERON) 30 MG tablet Take 30 mg by mouth at bedtime.      montelukast (SINGULAIR) 10 MG tablet Take 1 tablet (10 mg total) by mouth at bedtime. 30 tablet 1    Multiple Vitamin (THEREMS) TABS Take 1 tablet by mouth daily. (Patient not taking: Reported on 03/24/2023)      Multiple Vitamins-Minerals (MULTIVITAMIN WITH MINERALS) tablet Take 1 tablet by mouth daily.      nicotine (NICODERM CQ - DOSED IN MG/24 HOURS) 14 mg/24hr patch Place 1 patch (14 mg total) onto the skin daily. Do not use if actively smoking cigarettes. (Patient not taking: Reported on 03/24/2023) 28 patch 0    Omega-3 Fatty Acids (FISH OIL) 1000 MG CPDR Take 1,000 mg by mouth at bedtime.      Pyridoxine HCl (B-6) 100 MG TABS Take 1 tablet by mouth daily.      QUEtiapine (SEROQUEL) 200 MG tablet Take 200 mg by mouth at bedtime. (Patient not taking: Reported on 03/07/2023)      traZODone (DESYREL) 100 MG tablet Take 100 mg by mouth  at bedtime.      VITAMIN D, ERGOCALCIFEROL, PO Take  1,000 Units by mouth daily.        Patient Stressors: Financial difficulties   Health problems   Marital or family conflict    Patient Strengths: Ability for insight  Communication skills  Supportive family/friends   Treatment Modalities: Medication Management, Group therapy, Case management,  1 to 1 session with clinician, Psychoeducation, Recreational therapy.   Physician Treatment Plan for Primary Diagnosis: Schizophrenia (HCC) Long Term Goal(s): Improvement in symptoms so as ready for discharge   Short Term Goals: Ability to identify changes in lifestyle to reduce recurrence of condition will improve Ability to verbalize feelings will improve Ability to disclose and discuss suicidal ideas Ability to demonstrate self-control will improve Ability to identify and develop effective coping behaviors will improve Ability to maintain clinical measurements within normal limits will improve Ability to identify triggers associated with substance abuse/mental health issues will improve  Medication Management: Evaluate patient's response, side effects, and tolerance of medication regimen.  Therapeutic Interventions: 1 to 1 sessions, Unit Group sessions and Medication administration.  Evaluation of Outcomes: Adequate for Discharge  Physician Treatment Plan for Secondary Diagnosis: Principal Problem:   Schizophrenia (HCC)  Long Term Goal(s): Improvement in symptoms so as ready for discharge   Short Term Goals: Ability to identify changes in lifestyle to reduce recurrence of condition will improve Ability to verbalize feelings will improve Ability to disclose and discuss suicidal ideas Ability to demonstrate self-control will improve Ability to identify and develop effective coping behaviors will improve Ability to maintain clinical measurements within normal limits will improve Ability to identify triggers associated with substance abuse/mental health issues will improve      Medication Management: Evaluate patient's response, side effects, and tolerance of medication regimen.  Therapeutic Interventions: 1 to 1 sessions, Unit Group sessions and Medication administration.  Evaluation of Outcomes: Adequate for Discharge   RN Treatment Plan for Primary Diagnosis: Schizophrenia (HCC) Long Term Goal(s): Knowledge of disease and therapeutic regimen to maintain health will improve  Short Term Goals: Ability to remain free from injury will improve, Ability to verbalize frustration and anger appropriately will improve, Ability to demonstrate self-control, Ability to participate in decision making will improve, Ability to verbalize feelings will improve, Ability to disclose and discuss suicidal ideas, Ability to identify and develop effective coping behaviors will improve, and Compliance with prescribed medications will improve  Medication Management: RN will administer medications as ordered by provider, will assess and evaluate patient's response and provide education to patient for prescribed medication. RN will report any adverse and/or side effects to prescribing provider.  Therapeutic Interventions: 1 on 1 counseling sessions, Psychoeducation, Medication administration, Evaluate responses to treatment, Monitor vital signs and CBGs as ordered, Perform/monitor CIWA, COWS, AIMS and Fall Risk screenings as ordered, Perform wound care treatments as ordered.  Evaluation of Outcomes: Adequate for Discharge   LCSW Treatment Plan for Primary Diagnosis: Schizophrenia [facility]) Long Term Goal(s): Safe transition to appropriate next level of care at discharge, Engage patient in therapeutic group addressing interpersonal concerns.  Short Term Goals: Engage patient in aftercare planning with referrals and resources, Increase social support, Increase ability to appropriately verbalize feelings, Increase emotional regulation, Facilitate acceptance of mental health diagnosis and  concerns, and Increase skills for wellness and recovery  Therapeutic Interventions: Assess for all discharge needs, 1 to 1 time with Social worker, Explore available resources and support systems, Assess for adequacy in community support network, Educate family and significant other(s)  on suicide prevention, Complete Psychosocial Assessment, Interpersonal group therapy.  Evaluation of Outcomes: Adequate for Discharge   Progress in Treatment: Attending groups: Yes. and No. Participating in groups: Yes. and No. Taking medication as prescribed: Yes. Toleration medication: Yes. Family/Significant other contact made: No, will contact:  CSW will contact if given permission  Patient understands diagnosis: Yes. Discussing patient identified problems/goals with staff: Yes. Medical problems stabilized or resolved: Yes. Denies suicidal/homicidal ideation: Yes. Issues/concerns per patient self-inventory: No. Other: None   New problem(s) identified: No, Describe:  none Update 04/01/23: None at this time.Update 04/06/23: None at this time.       New Short Term/Long Term Goal(s):  elimination of symptoms of psychosis, medication management for mood stabilization; elimination of SI thoughts; development of comprehensive mental wellness plan. Update 04/01/23: None at this time.Update 04/06/23: None at this time.     Patient Goals:  "get some sleep" Update 04/01/23: None at this time.Update 04/06/23: None at this time.     Discharge Plan or Barriers: CSW will assist with appropriate discharge planning  Update 04/01/23: None at this time.Update 04/06/23: None at this time.     Reason for Continuation of Hospitalization: Medication stabilization Other; describe slUpdate 04/06/23: None at this time.  eep depravity induced hallucinations   Estimated Length of Stay: 1 to 7 days  Update 04/01/23: None at this time.Update 04/06/23: None at this time.    Last 3 [place] Suicide Severity Risk Score: Flowsheet Row  Admission (Current) from 03/25/2023 in [facility] from 03/24/2023 in [facility] Emergency Department at [facility] Admission (Discharged) from 03/15/2023 in [facility]  C-SSRS RISK CATEGORY No Risk No Risk No Risk       Last PHQ 2/9 Scores:    02/03/2023   11:49 AM  Depression screen PHQ 2/9  Decreased Interest 0  Down, Depressed, Hopeless 0  PHQ - 2 Score 0    Scribe for Treatment Team: Elza Rafter, Theresia Majors 04/06/2023 11:46 AM

## 2023-04-06 NOTE — Group Note (Unsigned)
Date:  04/06/2023 Time:  9:15 PM  Group Topic/Focus:  Self Care:   The focus of this group is to help patients understand the importance of self-care in order to improve or restore emotional, physical, spiritual, interpersonal, and financial health.     Participation Level:  {BHH PARTICIPATION ZOXWR:60454}  Participation Quality:  {BHH PARTICIPATION QUALITY:22265}  Affect:  {BHH AFFECT:22266}  Cognitive:  {BHH COGNITIVE:22267}  Insight: {BHH Insight2:20797}  Engagement in Group:  {BHH ENGAGEMENT IN UJWJX:91478}  Modes of Intervention:  {BHH MODES OF INTERVENTION:22269}  Additional Comments:  ***  [name] 04/06/2023, 9:15 PM

## 2023-04-06 NOTE — Group Note (Unsigned)
Date:  04/06/2023 Time:  9:05 PM  Group Topic/Focus:  Self Care:   The focus of this group is to help patients understand the importance of self-care in order to improve or restore emotional, physical, spiritual, interpersonal, and financial health.     Participation Level:  {BHH PARTICIPATION BPZWC:58527}  Participation Quality:  {BHH PARTICIPATION QUALITY:22265}  Affect:  {BHH AFFECT:22266}  Cognitive:  {BHH COGNITIVE:22267}  Insight: {BHH Insight2:20797}  Engagement in Group:  {BHH ENGAGEMENT IN POEUM:35361}  Modes of Intervention:  {BHH MODES OF INTERVENTION:22269}  Additional Comments:  ***  Burt Ek 04/06/2023, 9:05 PM

## 2023-04-06 NOTE — Plan of Care (Signed)
D: Pt alert and oriented. Pt denies experiencing any anxiety/depression at this time. Pt denies experiencing any pain at this time. Pt denies experiencing any SI/HI, or AVH at this time.   A: Scheduled medications administered to pt, per MD orders. Support and encouragement provided. Frequent verbal contact made. Routine safety checks conducted q15 minutes.   R: No adverse drug reactions noted. Pt verbally contracts for safety at this time. Pt compliant with medications and treatment plan. Pt interacts well with others on the unit. Pt remains safe at this time. Plan of care ongoing.  Problem: Coping: Goal: Level of anxiety will decrease Outcome: Progressing   Problem: Pain Managment: Goal: General experience of comfort will improve Outcome: Progressing

## 2023-04-06 NOTE — Progress Notes (Signed)
[facility] MD Progress Note  04/06/2023 10:47 AM [name]  MRN:  [id number] Subjective: [name] is seen on rounds.  He has been compliant with medications and denies any side effects.  I asked him if he is ready to go tomorrow and he said yes.  His mood and affect have improved and he should be ready to go.  Nurses report no problems.  He denies any suicidal ideation or auditory hallucinations. Principal Problem: Schizophrenia (HCC) Diagnosis: Principal Problem:   Schizophrenia (HCC)  Total Time spent with patient: 15 minutes  Past Psychiatric History: Schizophrenia  Past Medical History:  Past Medical History:  Diagnosis Date   Acute on chronic respiratory failure with hypoxia (HCC)    Asthma    Coagulation disorder (HCC)    COPD (chronic obstructive pulmonary disease) (HCC)    Depression    History of hiatal hernia    Hypertension    Hypokalemia    Leukocytosis    Lung mass    Pneumonia    Schizophrenia (HCC)    Shortness of breath dyspnea    Stroke [facility])    Umbilical hernia    Ventral hernia     Past Surgical History:  Procedure Laterality Date   COLONOSCOPY WITH PROPOFOL N/A 09/21/2021   Procedure: COLONOSCOPY WITH PROPOFOL;  Surgeon: Regis Bill, MD;  Location: [facility] ENDOSCOPY;  Service: Endoscopy;  Laterality: N/A;   HERNIA REPAIR     INSERTION OF MESH N/A 12/18/2014   Procedure: INSERTION OF MESH;  Surgeon: Lattie Haw, MD;  Location: [facility] ORS;  Service: General;  Laterality: N/A;   SUPRA-UMBILICAL HERNIA  12/18/2014   Procedure: SUPRA-UMBILICAL HERNIA;  Surgeon: Lattie Haw, MD;  Location: [facility] ORS;  Service: General;;   UMBILICAL HERNIA REPAIR N/A 12/18/2014   Procedure: HERNIA REPAIR UMBILICAL ADULT;  Surgeon: Lattie Haw, MD;  Location: [facility] ORS;  Service: General;  Laterality: N/A;   VIDEO BRONCHOSCOPY WITH ENDOBRONCHIAL ULTRASOUND N/A 03/15/2023   Procedure: VIDEO BRONCHOSCOPY WITH ENDOBRONCHIAL ULTRASOUND;  Surgeon: Vida Rigger, MD;  Location:  [facility] ORS;  Service: Thoracic;  Laterality: N/A;   Family History:  Family History  Problem Relation Age of Onset   Asthma Mother    Hypertension Father    Family Psychiatric  History: Unremarkable Social History:  Social History   Substance and Sexual Activity  Alcohol Use Not Currently   Alcohol/week: 75.0 standard drinks of alcohol   Types: 75 Cans of beer per week     Social History   Substance and Sexual Activity  Drug Use Not Currently   Types: Marijuana, "Crack" cocaine   Comment: none in 15 yrs    Social History   Socioeconomic History   Marital status: Widowed    Spouse name: Not on file   Number of children: Not on file   Years of education: Not on file   Highest education level: Not on file  Occupational History   Not on file  Tobacco Use   Smoking status: Every Day    Current packs/day: 0.15    Average packs/day: 0.2 packs/day for 45.0 years (6.8 ttl pk-yrs)    Types: Cigarettes   Smokeless tobacco: Never  Vaping Use   Vaping status: Never Used  Substance and Sexual Activity   Alcohol use: Not Currently    Alcohol/week: 75.0 standard drinks of alcohol    Types: 75 Cans of beer per week   Drug use: Not Currently    Types: Marijuana, "Crack" cocaine  Comment: none in 15 yrs   Sexual activity: Not Currently  Other Topics Concern   Not on file  Social History Narrative   Not on file   Social Determinants of Health   Financial Resource Strain: Not on file  Food Insecurity: No Food Insecurity (03/25/2023)   Hunger Vital Sign    Worried About Running Out of Food in the Last Year: Never true    Ran Out of Food in the Last Year: Never true  Transportation Needs: No Transportation Needs (03/25/2023)   PRAPARE - Administrator, Civil Service (Medical): No    Lack of Transportation (Non-Medical): No  Physical Activity: Not on file  Stress: Not on file  Social Connections: Not on file   Additional Social History:                          Sleep: Good  Appetite:  Good  Current Medications: Current Facility-Administered Medications  Medication Dose Route Frequency Provider Last Rate Last Admin   acetaminophen (TYLENOL) tablet 650 mg  650 mg Oral Q6H PRN Ajibola, Ene A, NP       albuterol (VENTOLIN HFA) 108 (90 Base) MCG/ACT inhaler 2 puff  2 puff Inhalation Q4H PRN Sarina Ill, DO   2 puff at 03/28/23 1542   alum & mag hydroxide-simeth (MAALOX/MYLANTA) 200-200-20 MG/5ML suspension 30 mL  30 mL Oral Q4H PRN Ajibola, Ene A, NP       aspirin EC tablet 81 mg  81 mg Oral Daily Lewanda Rife, MD   81 mg at 04/06/23 0915   clonazePAM (KLONOPIN) tablet 1 mg  1 mg Oral QHS Lewanda Rife, MD   1 mg at 04/05/23 2050   clopidogrel (PLAVIX) tablet 75 mg  75 mg Oral Daily Lewanda Rife, MD   75 mg at 04/06/23 0915   diltiazem (CARDIZEM CD) 24 hr capsule 240 mg  240 mg Oral Daily Sarina Ill, DO   240 mg at 04/06/23 1003   diphenhydrAMINE (BENADRYL) capsule 50 mg  50 mg Oral TID PRN Ajibola, Ene A, NP   50 mg at 03/25/23 2125   Or   diphenhydrAMINE (BENADRYL) injection 50 mg  50 mg Intramuscular TID PRN Ajibola, Ene A, NP       famotidine (PEPCID) tablet 20 mg  20 mg Oral Daily Lewanda Rife, MD   20 mg at 04/06/23 0914   fluticasone furoate-vilanterol (BREO ELLIPTA) 100-25 MCG/ACT 1 puff  1 puff Inhalation Daily Lewanda Rife, MD   1 puff at 04/06/23 0917   haloperidol (HALDOL) tablet 5 mg  5 mg Oral TID PRN Ajibola, Ene A, NP   5 mg at 03/25/23 2125   Or   haloperidol lactate (HALDOL) injection 5 mg  5 mg Intramuscular TID PRN Ajibola, Ene A, NP       hydrOXYzine (ATARAX) tablet 25 mg  25 mg Oral TID PRN Ajibola, Ene A, NP   25 mg at 03/27/23 2303   ipratropium-albuterol (DUONEB) 0.5-2.5 (3) MG/3ML nebulizer solution 3 mL  3 mL Nebulization Q6H PRN Lewanda Rife, MD   3 mL at 03/28/23 1541   LORazepam (ATIVAN) tablet 2 mg  2 mg Oral TID PRN Ajibola, Ene A, NP   2 mg at 03/27/23  0404   Or   LORazepam (ATIVAN) injection 2 mg  2 mg Intramuscular TID PRN Ajibola, Ene A, NP       magnesium hydroxide (MILK OF MAGNESIA) suspension  30 mL  30 mL Oral Daily PRN Ajibola, Ene A, NP       mirtazapine (REMERON) tablet 30 mg  30 mg Oral QHS Lewanda Rife, MD   30 mg at 04/05/23 2050   montelukast (SINGULAIR) tablet 10 mg  10 mg Oral QHS Lewanda Rife, MD   10 mg at 04/05/23 2050   nicotine (NICODERM CQ - dosed in mg/24 hours) patch 14 mg  14 mg Transdermal Daily Lewanda Rife, MD   14 mg at 04/06/23 1610   paliperidone (INVEGA) 24 hr tablet 9 mg  9 mg Oral QHS Lewanda Rife, MD   9 mg at 04/05/23 2050   traZODone (DESYREL) tablet 150 mg  150 mg Oral QHS PRN Sarina Ill, DO   150 mg at 04/05/23 2050    Lab Results: No results found for this or any previous visit (from the past 48 hour(s)).  Blood Alcohol level:  Lab Results  Component Value Date   ETH <10 03/24/2023    Metabolic Disorder Labs: Lab Results  Component Value Date   HGBA1C 5.8 (H) 03/26/2023   MPG 119.76 03/26/2023   No results found for: "PROLACTIN" Lab Results  Component Value Date   CHOL 108 03/26/2023   TRIG 72 03/26/2023   HDL 43 03/26/2023   CHOLHDL 2.5 03/26/2023   VLDL 14 03/26/2023   LDLCALC 51 03/26/2023    Physical Findings: AIMS:  , ,  ,  ,    CIWA:    COWS:     Musculoskeletal: Strength & Muscle Tone: within normal limits Gait & Station: normal Patient leans: N/A  Psychiatric Specialty Exam:  Presentation  General Appearance:  Appropriate for Environment  Eye Contact: Fair  Speech: Clear and Coherent  Speech Volume: Normal  Handedness:No data recorded  Mood and Affect  Mood: Anxious; Depressed  Affect: Constricted   Thought Process  Thought Processes: Coherent; Goal Directed  Descriptions of Associations:Intact  Orientation:Full (Time, Place and Person)  Thought Content:Abstract Reasoning  History of  Schizophrenia/Schizoaffective disorder:Yes  Duration of Psychotic Symptoms:Greater than six months  Hallucinations:No data recorded Ideas of Reference:None  Suicidal Thoughts:No data recorded Homicidal Thoughts:No data recorded  Sensorium  Memory: Immediate Fair  Judgment: Fair  Insight: Fair   Art therapist  Concentration: Fair  Attention Span: Fair  Recall:No data recorded Fund of Knowledge:No data recorded Language: Fair   Psychomotor Activity  Psychomotor Activity:No data recorded  Assets  Assets: Manufacturing systems engineer; Desire for Improvement; Housing   Sleep  Sleep:No data recorded  Blood pressure 110/82, pulse (!) 103, temperature 98.8 F (37.1 C), resp. rate 17, height 6\' 1"  (1.854 m), weight 98.4 kg, SpO2 96%. Body mass index is 28.63 kg/m.   Treatment Plan Summary: Daily contact with patient to assess and evaluate symptoms and progress in treatment, Medication management, and Plan continue current medications.  Discharge tomorrow.  Sarina Ill, DO 04/06/2023, 10:47 AM

## 2023-04-06 NOTE — Group Note (Signed)
Date:  04/06/2023 Time:  11:24 AM  Group Topic/Focus:  Outside Rec/Music Therapy  The purpose of this group is to get patients outside and get fresh air while listening to therapeutic music.   Participation Level:  Did Not Attend  Participation Quality:    Affect:    Cognitive:    Insight:   Engagement in Group:    Modes of Intervention:    Additional Comments:  Did not attend   Vallory Oetken T Brittley Regner 04/06/2023, 11:24 AM

## 2023-04-06 NOTE — Group Note (Signed)
Recreation Therapy Group Note   Group Topic:Relaxation  Group Date: 04/06/2023 Start Time: 1400 End Time: 1445 Facilitators: Clinton Gallant, CTRS Location: Day Room  Group Description: Chair Yoga. LRT and patients discussed the benefits of yoga and how it differs from strength exercises. LRT educated patients on the mental and physical benefits of yoga and deep breathing and how it can be used as a Associate Professor. LRT and patients followed along to a guided yoga session on the television that focused on all parts of the body, as well as deep breathing. Pt encouraged to stop movement at any time if they feel discomfort or pain.   Goal Area(s) Addressed: Patient will practice using relaxation technique. Patient will identify a new coping skill.  Patient will follow multistep directions to reduce anxiety and stress.   Affect/Mood: N/A   Participation Level: Did not attend    Clinical Observations/Individualized Feedback: Devane did not attend group.  Plan: Continue to engage patient in RT group sessions 2-3x/week.   [address], LRT, CTRS 04/06/2023 3:15 PM

## 2023-04-06 NOTE — Group Note (Signed)
Date:  04/06/2023 Time:  4:22 PM  Group Topic/Focus:  Therapeutic exercises that support impactful group sessions.  The cards serve as stand-alone treatment activities to help clients understand therapeutic concepts, build coping skills, and generalize their skills outside of the group.     Participation Level:  Active  Participation Quality:  Appropriate and Attentive  Affect:  Appropriate and Excited  Cognitive:  Alert, Appropriate, and Oriented  Insight: Appropriate and Improving  Engagement in Group:  Developing/Improving, Engaged, and Supportive  Modes of Intervention:  Activity, Discussion, Education, Role-play, and Socialization  Additional Comments:    Rosaura Carpenter 04/06/2023, 4:22 PM

## 2023-04-06 NOTE — Group Note (Signed)
LCSW Group Therapy Note  Group Date: 04/06/2023 Start Time: 1315 End Time: 1400   Type of Therapy and Topic:  Group Therapy - Healthy vs Unhealthy Coping Skills  Participation Level:  Did Not Attend   Description of Group The focus of this group was to determine what unhealthy coping techniques typically are used by group members and what healthy coping techniques would be helpful in coping with various problems. Patients were guided in becoming aware of the differences between healthy and unhealthy coping techniques. Patients were asked to identify 2-3 healthy coping skills they would like to learn to use more effectively.  Therapeutic Goals Patients learned that coping is what human beings do all day long to deal with various situations in their lives Patients defined and discussed healthy vs unhealthy coping techniques Patients identified their preferred coping techniques and identified whether these were healthy or unhealthy Patients determined 2-3 healthy coping skills they would like to become more familiar with and use more often. Patients provided support and ideas to each other   Summary of Patient Progress:  X  Therapeutic Modalities Cognitive Behavioral Therapy Motivational Interviewing  Elza Rafter, [place] 04/06/2023  2:27 PM

## 2023-04-06 NOTE — Progress Notes (Signed)
   04/05/23 2300  Psych Admission Type (Psych Patients Only)  Admission Status Voluntary  Psychosocial Assessment  Patient Complaints None  Eye Contact Fair  Facial Expression Flat  Affect Appropriate to circumstance  Speech Logical/coherent  Interaction Minimal  Motor Activity Slow  Appearance/Hygiene In scrubs  Behavior Characteristics Cooperative  Mood Pleasant  Thought Process  Coherency WDL  Content WDL  Delusions None reported or observed  Perception WDL  Hallucination None reported or observed  Judgment Impaired  Confusion None  Danger to Self  Current suicidal ideation? Denies  Agreement Not to Harm Self Yes  Description of Agreement Verbal  Danger to Others  Danger to Others None reported or observed

## 2023-04-07 ENCOUNTER — Other Ambulatory Visit: Payer: Medicare Other

## 2023-04-07 ENCOUNTER — Other Ambulatory Visit: Payer: Self-pay | Admitting: Oncology

## 2023-04-07 ENCOUNTER — Telehealth: Payer: Self-pay

## 2023-04-07 DIAGNOSIS — F201 Disorganized schizophrenia: Secondary | ICD-10-CM | POA: Diagnosis not present

## 2023-04-07 DIAGNOSIS — C3492 Malignant neoplasm of unspecified part of left bronchus or lung: Secondary | ICD-10-CM

## 2023-04-07 MED ORDER — CLOPIDOGREL BISULFATE 75 MG PO TABS: 75.0000 mg | ORAL_TABLET | Freq: Every day | ORAL | 3 refills | Status: DC

## 2023-04-07 MED ORDER — MIRTAZAPINE 30 MG PO TABS: 30.0000 mg | ORAL_TABLET | Freq: Every day | ORAL | 3 refills | Status: DC

## 2023-04-07 MED ORDER — CLONAZEPAM 1 MG PO TABS: 1.0000 mg | ORAL_TABLET | Freq: Every day | ORAL | 0 refills | Status: DC

## 2023-04-07 MED ORDER — PALIPERIDONE ER 9 MG PO TB24: 9.0000 mg | ORAL_TABLET | Freq: Every day | ORAL | 3 refills | Status: DC

## 2023-04-07 MED ORDER — DILTIAZEM HCL ER COATED BEADS 240 MG PO CP24: 240.0000 mg | ORAL_CAPSULE | Freq: Every day | ORAL | 3 refills | Status: DC

## 2023-04-07 MED ORDER — TRAZODONE HCL 150 MG PO TABS: 150.0000 mg | ORAL_TABLET | Freq: Every evening | ORAL | 3 refills | Status: DC | PRN

## 2023-04-07 NOTE — Telephone Encounter (Signed)
Per Lawson Fiscal in Radiation, pt will start radiation treatment on 9/12. Dr. Cathie Hoops has updated IS to reflect this date.

## 2023-04-07 NOTE — [facility] Suicide Risk Assessment (Signed)
[facility] Discharge Suicide Risk Assessment   Principal Problem: Schizophrenia [facility]) Discharge Diagnoses: Principal Problem:   Schizophrenia (HCC)   Total Time spent with patient: 1 hour  Musculoskeletal: Strength & Muscle Tone: within normal limits Gait & Station: normal Patient leans: N/A  Psychiatric Specialty Exam  Presentation  General Appearance:  Appropriate for Environment  Eye Contact: Fair  Speech: Clear and Coherent  Speech Volume: Normal  Handedness:No data recorded  Mood and Affect  Mood: Anxious; Depressed  Duration of Depression Symptoms: Greater than two weeks  Affect: Constricted   Thought Process  Thought Processes: Coherent; Goal Directed  Descriptions of Associations:Intact  Orientation:Full (Time, Place and Person)  Thought Content:Abstract Reasoning  History of Schizophrenia/Schizoaffective disorder:Yes  Duration of Psychotic Symptoms:Greater than six months  Hallucinations:No data recorded Ideas of Reference:None  Suicidal Thoughts:No data recorded Homicidal Thoughts:No data recorded  Sensorium  Memory: Immediate Fair  Judgment: Fair  Insight: Fair   Art therapist  Concentration: Fair  Attention Span: Fair  Recall:No data recorded Fund of Knowledge:No data recorded Language: Fair   Psychomotor Activity  Psychomotor Activity:No data recorded  Assets  Assets: Manufacturing systems engineer; Desire for Improvement; Housing   Sleep  Sleep:No data recorded  Blood pressure 117/64, pulse 93, temperature 98.5 F (36.9 C), resp. rate 16, height 6\' 1"  (1.854 m), weight 98.4 kg, SpO2 91%. Body mass index is 28.63 kg/m.  Mental Status Per Nursing Assessment::   On Admission:  NA  Demographic Factors:  Male  Loss Factors: NA  Historical Factors: NA  Risk Reduction Factors:   NA  Continued Clinical Symptoms:  Previous Psychiatric Diagnoses and Treatments  Cognitive Features That Contribute To Risk:   None    Suicide Risk:  Minimal: No identifiable suicidal ideation.  Patients presenting with no risk factors but with morbid ruminations; may be classified as minimal risk based on the severity of the depressive symptoms   Follow-up Information     [facility], [facility] Follow up.   Why: Although you declined follow-up with therapy or psychiatry I did want to include the walk-in information for [facility] in case you needed it. Walk-In Crisis Hours: 7 days/week, 8am - 12am for [employer] information: [address] [place] [address] [phone]                 Plan Of Care/Follow-up recommendations: [facility]   Sarina Ill, DO 04/07/2023, 10:13 AM

## 2023-04-07 NOTE — [facility]HH Suicide Risk Assessment (Signed)
[facility] INPATIENT:  Family/Significant Other Suicide Prevention Education  Suicide Prevention Education:  Patient Refusal for Family/Significant Other Suicide Prevention Education: The patient [name] has refused to provide written consent for family/significant other to be provided Family/Significant Other Suicide Prevention Education during admission and/or prior to discharge.  Physician notified.  Elza Rafter 04/07/2023, 9:58 AM

## 2023-04-07 NOTE — Progress Notes (Signed)
   04/07/23 0728  Psych Admission Type (Psych Patients Only)  Admission Status Voluntary  Psychosocial Assessment  Patient Complaints None  Eye Contact Fair  Facial Expression Blank  Affect Appropriate to circumstance  Speech Logical/coherent  Interaction Minimal  Motor Activity Slow  Appearance/Hygiene In scrubs  Behavior Characteristics Cooperative  Mood Pleasant  Thought Process  Coherency WDL  Content WDL  Delusions None reported or observed  Perception WDL  Hallucination None reported or observed  Judgment Impaired  Confusion None  Danger to Self  Current suicidal ideation? Denies

## 2023-04-07 NOTE — Care Management Important Message (Signed)
Important Message  Patient Details  Name: [name] MRN: [id number] Date of Birth: 06/16/1961   Medicare Important Message Given:  Yes     Elza Rafter, LCSWA 04/07/2023, 10:06 AM

## 2023-04-07 NOTE — Discharge Summary (Signed)
Physician Discharge Summary Note  Patient:  [name] is an 62 y.o., male MRN:  [id number] DOB:  08/23/1960 Patient phone:  [phone] (home)  Patient address:   [address] [place] [address],  Total Time spent with patient: 1 hour  Date of Admission:  03/25/2023 Date of Discharge: 04/07/2023  Reason for Admission:   Patient presented to the  emergency department secondary to 4 days of insomnia now with auditory hallucinations. Has a patient has already been on significant amounts of sleep aids such as melatonin, trazodone, Remeron, and Seroquel with no effect.    Patient's is seen today for evaluation.  He endorses depressed mood.  Patient states that he feels tired because he has not slept in the past 4 to 5 days.  Patient said that his medicines including melatonin, trazodone, Remeron, and Seroquel has not helped with sleep.  Patient reports history of schizophrenia.  He reports he has been hearing voices telling him to harm himself.  He said that the voices are getting louder.  Patient denies any intention to harm himself or others.  Patient also shared that he has been diagnosed with lung carcinoma in the past.  He said he was in remission but" the cancer is back".  Patient is not sure when he is going to start chemotherapy.  He denies visual hallucinations today.  Patient was provided with support and reassurance.  Principal Problem: Schizophrenia [name]) Discharge Diagnoses: Principal Problem:   Schizophrenia [facility])   Past Psychiatric History: [name] has a history of schizophrenia.  He is in a group home and apparently has been off his medications.  Past Medical History:  Past Medical History:  Diagnosis Date   Acute on chronic respiratory failure with hypoxia (HCC)    Asthma    Coagulation disorder (HCC)    COPD (chronic obstructive pulmonary disease) (HCC)    Depression    History of hiatal hernia    Hypertension    Hypokalemia    Leukocytosis    Lung mass     Pneumonia    Schizophrenia (HCC)    Shortness of breath dyspnea    Stroke [facility])    Umbilical hernia    Ventral hernia     Past Surgical History:  Procedure Laterality Date   COLONOSCOPY WITH PROPOFOL N/A 09/21/2021   Procedure: COLONOSCOPY WITH PROPOFOL;  Surgeon: Regis Bill, MD;  Location: [facility] ENDOSCOPY;  Service: Endoscopy;  Laterality: N/A;   HERNIA REPAIR     INSERTION OF MESH N/A 12/18/2014   Procedure: INSERTION OF MESH;  Surgeon: Lattie Haw, MD;  Location: [facility] ORS;  Service: General;  Laterality: N/A;   SUPRA-UMBILICAL HERNIA  12/18/2014   Procedure: SUPRA-UMBILICAL HERNIA;  Surgeon: Lattie Haw, MD;  Location: [facility] ORS;  Service: General;;   UMBILICAL HERNIA REPAIR N/A 12/18/2014   Procedure: HERNIA REPAIR UMBILICAL ADULT;  Surgeon: Lattie Haw, MD;  Location: [facility] ORS;  Service: General;  Laterality: N/A;   VIDEO BRONCHOSCOPY WITH ENDOBRONCHIAL ULTRASOUND N/A 03/15/2023   Procedure: VIDEO BRONCHOSCOPY WITH ENDOBRONCHIAL ULTRASOUND;  Surgeon: Vida Rigger, MD;  Location: [facility] ORS;  Service: Thoracic;  Laterality: N/A;   Family History:  Family History  Problem Relation Age of Onset   Asthma Mother    Hypertension Father    Family Psychiatric  History: Unremarkable Social History:  Social History   Substance and Sexual Activity  Alcohol Use Not Currently   Alcohol/week: 75.0 standard drinks of alcohol   Types: 75 Cans of  beer per week     Social History   Substance and Sexual Activity  Drug Use Not Currently   Types: Marijuana, "Crack" cocaine   Comment: none in 15 yrs    Social History   Socioeconomic History   Marital status: Widowed    Spouse name: Not on file   Number of children: Not on file   Years of education: Not on file   Highest education level: Not on file  Occupational History   Not on file  Tobacco Use   Smoking status: Every Day    Current packs/day: 0.15    Average packs/day: 0.2 packs/day for 45.0 years (6.8  ttl pk-yrs)    Types: Cigarettes   Smokeless tobacco: Never  Vaping Use   Vaping status: Never Used  Substance and Sexual Activity   Alcohol use: Not Currently    Alcohol/week: 75.0 standard drinks of alcohol    Types: 75 Cans of beer per week   Drug use: Not Currently    Types: Marijuana, "Crack" cocaine    Comment: none in 15 yrs   Sexual activity: Not Currently  Other Topics Concern   Not on file  Social History Narrative   Not on file   Social Determinants of Health   Financial Resource Strain: Not on file  Food Insecurity: No Food Insecurity (03/25/2023)   Hunger Vital Sign    Worried About Running Out of Food in the Last Year: Never true    Ran Out of Food in the Last Year: Never true  Transportation Needs: No Transportation Needs (03/25/2023)   PRAPARE - Administrator, Civil Service (Medical): No    Lack of Transportation (Non-Medical): No  Physical Activity: Not on file  Stress: Not on file  Social Connections: Not on file    Hospital Course: [name] was voluntarily admitted to inpatient psychiatry and placed back on his psychiatric medications.  He did really well with the medicines.  He did not have any side effects in his auditory hallucinations subsided along with his depression.  He slept well and ate well.  He interacted well with staff and peers.  He denied any side effects from his medications.  There is no evidence of EPS or TD.  He was pleasant and cooperative throughout his hospitalization.  It was felt that he maximize hospitalization he was discharged back to the group home.  On the day of discharge she denied suicidal ideation, homicidal ideation, auditory or visual hallucinations.  His judgment and insight were good.  Physical Findings: AIMS:  , ,  ,  ,    CIWA:    COWS:     Musculoskeletal: Strength & Muscle Tone: within normal limits Gait & Station: normal Patient leans: N/A   Psychiatric Specialty Exam:  Presentation  General  Appearance:  Appropriate for Environment  Eye Contact: Fair  Speech: Clear and Coherent  Speech Volume: Normal  Handedness:No data recorded  Mood and Affect  Mood: Anxious; Depressed  Affect: Constricted   Thought Process  Thought Processes: Coherent; Goal Directed  Descriptions of Associations:Intact  Orientation:Full (Time, Place and Person)  Thought Content:Abstract Reasoning  History of Schizophrenia/Schizoaffective disorder:Yes  Duration of Psychotic Symptoms:Greater than six months  Hallucinations:No data recorded Ideas of Reference:None  Suicidal Thoughts:No data recorded Homicidal Thoughts:No data recorded  Sensorium  Memory: Immediate Fair  Judgment: Fair  Insight: Fair   Art therapist  Concentration: Fair  Attention Span: Fair  Recall:No data recorded Fund of Knowledge:No data recorded  Language: Fair   Psychomotor Activity  Psychomotor Activity:No data recorded  Assets  Assets: Communication Skills; Desire for Improvement; Housing   Sleep  Sleep:No data recorded   Physical Exam: Physical Exam Vitals and nursing note reviewed.  Constitutional:      Appearance: Normal appearance. He is normal weight.  Neurological:     General: No focal deficit present.     Mental Status: He is alert and oriented to person, place, and time.  Psychiatric:        Attention and Perception: Attention and perception normal.        Mood and Affect: Mood and affect normal.        Speech: Speech normal.        Behavior: Behavior normal. Behavior is cooperative.        Thought Content: Thought content normal.        Cognition and Memory: Cognition and memory normal.        Judgment: Judgment normal.    Review of Systems  Constitutional: Negative.   HENT: Negative.    Eyes: Negative.   Respiratory: Negative.    Cardiovascular: Negative.   Gastrointestinal: Negative.   Genitourinary: Negative.   Musculoskeletal: Negative.    Skin: Negative.   Neurological: Negative.   Endo/Heme/Allergies: Negative.   Psychiatric/Behavioral: Negative.     Blood pressure 117/64, pulse 93, temperature 98.5 F (36.9 C), resp. rate 16, height 6\' 1"  (1.854 m), weight 98.4 kg, SpO2 91%. Body mass index is 28.63 kg/m.   Social History   Tobacco Use  Smoking Status Every Day   Current packs/day: 0.15   Average packs/day: 0.2 packs/day for 45.0 years (6.8 ttl pk-yrs)   Types: Cigarettes  Smokeless Tobacco Never   Tobacco Cessation:  A prescription for an FDA-approved tobacco cessation medication was offered at discharge and the patient refused   Blood Alcohol level:  Lab Results  Component Value Date   [facility] <10 03/24/2023    Metabolic Disorder Labs:  Lab Results  Component Value Date   HGBA1C 5.8 (H) 03/26/2023   MPG 119.76 03/26/2023   No results found for: "PROLACTIN" Lab Results  Component Value Date   CHOL 108 03/26/2023   TRIG 72 03/26/2023   HDL 43 03/26/2023   CHOLHDL 2.5 03/26/2023   VLDL 14 03/26/2023   LDLCALC 51 03/26/2023    See Psychiatric Specialty Exam and Suicide Risk Assessment completed by Attending Physician prior to discharge.  Discharge destination:  Home  Is patient on multiple antipsychotic therapies at discharge:  No   Has Patient had three or more failed trials of antipsychotic monotherapy by history:  No  Recommended Plan for Multiple Antipsychotic Therapies: NA   Allergies as of 04/07/2023   No Known Allergies      Medication List     STOP taking these medications    acetaminophen 325 MG tablet Commonly known as: TYLENOL   amantadine 100 MG capsule Commonly known as: SYMMETREL   gabapentin 100 MG capsule Commonly known as: NEURONTIN   hydrochlorothiazide 12.5 MG tablet Commonly known as: HYDRODIURIL   Invega Trinza 819 MG/2.625ML injection Generic drug: paliperidone Palmitate ER   melatonin 5 MG Tabs   nicotine 14 mg/24hr patch Commonly known as:  NICODERM CQ - dosed in mg/24 hours   QUEtiapine 200 MG tablet Commonly known as: SEROQUEL   Therems Tabs       TAKE these medications      Indication  albuterol 108 (90 Base) MCG/ACT inhaler Commonly known  as: VENTOLIN HFA Inhale 2 puffs into the lungs every 4 (four) hours as needed for wheezing or shortness of breath.    aspirin EC 81 MG tablet Take 81 mg by mouth daily. Swallow whole.    B-6 100 MG Tabs Take 1 tablet by mouth daily.    clonazePAM 1 MG tablet Commonly known as: KLONOPIN Take 1 tablet (1 mg total) by mouth at bedtime.    clopidogrel 75 MG tablet Commonly known as: PLAVIX Take 1 tablet (75 mg total) by mouth daily. 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    diltiazem 240 MG 24 hr capsule Commonly known as: CARDIZEM CD Take 1 capsule (240 mg total) by mouth daily. Start taking on: April 08, 2023 What changed:  medication strength how much to take  Indication: High Blood Pressure Disorder   docusate sodium 100 MG capsule Commonly known as: COLACE Take 100 mg by mouth 2 (two) times daily as needed for mild constipation.    famotidine 20 MG tablet Commonly known as: PEPCID Take 1 tablet (20 mg total) by mouth daily.    Fish Oil 1000 MG Cpdr Take 1,000 mg by mouth at bedtime.    guaiFENesin 600 MG 12 hr tablet Commonly known as: MUCINEX Take 600 mg by mouth 2 (two) times daily as needed.    ipratropium-albuterol 0.5-2.5 (3) MG/3ML Soln Commonly known as: DUONEB Take 3 mLs by nebulization every 6 (six) hours as needed (every 4 to 6 hours as needed for shortnes of breath or wheeziing).    ketoconazole 2 % cream Commonly known as: NIZORAL Apply to both feet and between toes once daily for 6 weeks.    lovastatin 20 MG  tablet Commonly known as: MEVACOR Take 20 mg by mouth at bedtime.    mirtazapine 30 MG tablet Commonly known as: REMERON Take 1 tablet (30 mg total) by mouth at bedtime.  Indication: Major Depressive Disorder   montelukast 10 MG tablet Commonly known as: SINGULAIR Take 1 tablet (10 mg total) by mouth at bedtime.    multivitamin with minerals tablet Take 1 tablet by mouth daily.    paliperidone 9 MG 24 hr tablet Commonly known as: INVEGA Take 1 tablet (9 mg total) by mouth at bedtime.  Indication: Schizophrenia   traZODone 150 MG tablet Commonly known as: DESYREL Take 1 tablet (150 mg total) by mouth at bedtime as needed for sleep. What changed:  medication strength how much to take when to take this reasons to take this  Indication: Trouble Sleeping, Major Depressive Disorder   Trelegy Ellipta 100-62.5-25 MCG/ACT Aepb Generic drug: Fluticasone-Umeclidin-Vilant Inhale 1 puff into the lungs daily.  Indication: Chronic Obstructive Lung Disease   VITAMIN D (ERGOCALCIFEROL) PO Take 1,000 Units by mouth daily.         Follow-up Information     [facility], [facility] Follow up.   Why: Although you declined follow-up with therapy or psychiatry I did want to include the walk-in information for [facility] in case you needed it. Walk-In Crisis Hours: 7 days/week, 8am - 12am for [employer] information: [address] [place] [address] [phone]                 Follow-up recommendations:  [facility]    Signed: Lanny Cramp  Deniya Craigo, DO 04/07/2023, 10:30 AM

## 2023-04-07 NOTE — BHH Counselor (Signed)
CSW called [facility] to arrange ride for pt from hospital.   [facility] stated they would come pick pt up after lunch.

## 2023-04-07 NOTE — Plan of Care (Signed)

## 2023-04-07 NOTE — Group Note (Signed)
Date:  04/07/2023 Time:  10:29 AM  Group Topic/Focus:  Goals Group:   The focus of this group is to help patients establish daily goals to achieve during treatment and discuss how the patient can incorporate goal setting into their daily lives to aide in recovery.    Participation Level:  Did Not Attend   [name] 04/07/2023, 10:29 AM

## 2023-04-07 NOTE — [place] FL2 (Signed)
[place] MEDICAID FL2 LEVEL OF CARE FORM     IDENTIFICATION  Patient Name: [name] Birthdate: 25-Jul-1961 Sex: male Admission Date (Current Location): 03/25/2023  [facility] and [place] Number:  Chiropodist and Address:  [facility], [address], [place], [place] [address]      Provider Number: [id number]  Attending Physician Name and Address:  Sarina Ill,*  Relative Name and Phone Number:       Current Level of Care: Hospital Recommended Level of Care: [facility] Prior Approval Number:    Date Approved/Denied:   PASRR Number:    Discharge Plan: Other (Comment) (Group Home)    Current Diagnoses: Patient Active Problem List   Diagnosis Date Noted   Goals of care, counseling/discussion 03/22/2023   Tobacco use 02/04/2023   Adenocarcinoma of left lung (HCC) 12/12/2021   Leukocytosis 12/12/2021   Schizophrenia (HCC)    Nicotine dependence    Hypokalemia 04/13/2020   Essential hypertension 04/13/2020   Coagulation disorder (HCC) 12/16/2019   Pain due to onychomycosis of toenails of both feet 02/14/2019   COPD exacerbation (HCC) 01/16/2017   Ventral hernia 12/18/2014   Hematoma of leg 01/30/2014    Orientation RESPIRATION BLADDER Height & Weight     Self, Time, Situation, Place  Normal Continent Weight: 217 lb (98.4 kg) Height:  6\' 1"  (185.4 cm)  BEHAVIORAL SYMPTOMS/MOOD NEUROLOGICAL BOWEL NUTRITION STATUS      Continent    AMBULATORY STATUS COMMUNICATION OF NEEDS Skin   Independent Verbally Normal                       Personal Care Assistance Level of Assistance              Functional Limitations Info             SPECIAL CARE FACTORS FREQUENCY                       Contractures Contractures Info: Not present    Additional Factors Info  Psychotropic     Psychotropic Info: Hinda Glatter, Klonopin         Current Medications (04/07/2023):  This is the current hospital active  medication list Current Facility-Administered Medications  Medication Dose Route Frequency Provider Last Rate Last Admin   acetaminophen (TYLENOL) tablet 650 mg  650 mg Oral Q6H PRN Ajibola, Ene A, NP       albuterol (VENTOLIN HFA) 108 (90 Base) MCG/ACT inhaler 2 puff  2 puff Inhalation Q4H PRN Sarina Ill, DO   2 puff at 03/28/23 1542   alum & mag hydroxide-simeth (MAALOX/MYLANTA) 200-200-20 MG/5ML suspension 30 mL  30 mL Oral Q4H PRN Ajibola, Ene A, NP       aspirin EC tablet 81 mg  81 mg Oral Daily Lewanda Rife, MD   81 mg at 04/07/23 5284   clonazePAM (KLONOPIN) tablet 1 mg  1 mg Oral QHS Lewanda Rife, MD   1 mg at 04/06/23 2126   clopidogrel (PLAVIX) tablet 75 mg  75 mg Oral Daily Lewanda Rife, MD   75 mg at 04/07/23 1324   diltiazem (CARDIZEM CD) 24 hr capsule 240 mg  240 mg Oral Daily Sarina Ill, DO   240 mg at 04/07/23 4010   diphenhydrAMINE (BENADRYL) capsule 50 mg  50 mg Oral TID PRN Ajibola, Ene A, NP   50 mg at 03/25/23 2125   Or   diphenhydrAMINE (  BENADRYL) injection 50 mg  50 mg Intramuscular TID PRN Ajibola, Ene A, NP       famotidine (PEPCID) tablet 20 mg  20 mg Oral Daily Lewanda Rife, MD   20 mg at 04/07/23 0927   fluticasone furoate-vilanterol (BREO ELLIPTA) 100-25 MCG/ACT 1 puff  1 puff Inhalation Daily Lewanda Rife, MD   1 puff at 04/07/23 5784   haloperidol (HALDOL) tablet 5 mg  5 mg Oral TID PRN Ajibola, Ene A, NP   5 mg at 03/25/23 2125   Or   haloperidol lactate (HALDOL) injection 5 mg  5 mg Intramuscular TID PRN Ajibola, Ene A, NP       hydrOXYzine (ATARAX) tablet 25 mg  25 mg Oral TID PRN Ajibola, Ene A, NP   25 mg at 03/27/23 2303   ipratropium-albuterol (DUONEB) 0.5-2.5 (3) MG/3ML nebulizer solution 3 mL  3 mL Nebulization Q6H PRN Lewanda Rife, MD   3 mL at 03/28/23 1541   LORazepam (ATIVAN) tablet 2 mg  2 mg Oral TID PRN Ajibola, Ene A, NP   2 mg at 03/27/23 0404   Or   LORazepam (ATIVAN) injection 2 mg  2 mg  Intramuscular TID PRN Ajibola, Ene A, NP       magnesium hydroxide (MILK OF MAGNESIA) suspension 30 mL  30 mL Oral Daily PRN Ajibola, Ene A, NP       mirtazapine (REMERON) tablet 30 mg  30 mg Oral QHS Lewanda Rife, MD   30 mg at 04/06/23 2126   montelukast (SINGULAIR) tablet 10 mg  10 mg Oral QHS Lewanda Rife, MD   10 mg at 04/06/23 2128   nicotine (NICODERM CQ - dosed in mg/24 hours) patch 14 mg  14 mg Transdermal Daily Lewanda Rife, MD   14 mg at 04/07/23 0942   paliperidone (INVEGA) 24 hr tablet 9 mg  9 mg Oral QHS Lewanda Rife, MD   9 mg at 04/06/23 2125   traZODone (DESYREL) tablet 150 mg  150 mg Oral QHS PRN Sarina Ill, DO   150 mg at 04/06/23 2140     Discharge Medications: Please see discharge summary for a list of discharge medications.  Relevant Imaging Results:  Relevant Lab Results:   Additional Information SS#: [id number]  Elza Rafter, [place]

## 2023-04-07 NOTE — Plan of Care (Signed)

## 2023-04-07 NOTE — Progress Notes (Signed)

## 2023-04-07 NOTE — Progress Notes (Signed)
D- Patient alert and oriented. Flat affect. Depressed mood. Withdrawn to self. C/o difficulty sleeping. Denies SI, HI, AVH, and pain.  A- Scheduled medications administered to patient, per MD orders. Trazodone 150 mg po given scheduled PRN for insomnia. Support and encouragement provided.  Routine safety checks conducted every 15 minutes.  Patient informed to notify staff with problems or concerns. R- No adverse drug reactions noted. Patient contracts for safety at this time. Patient compliant with medications and treatment plan. Patient receptive, calm, and cooperative. Limited interaction with peers.  Patient remains safe at this time.

## 2023-04-07 NOTE — Group Note (Unsigned)
Date:  04/07/2023 Time:  10:24 AM  Group Topic/Focus:  Goals Group:   The focus of this group is to help patients establish daily goals to achieve during treatment and discuss how the patient can incorporate goal setting into their daily lives to aide in recovery.     Participation Level:  {BHH PARTICIPATION WUJWJ:19147}  Participation Quality:  {BHH PARTICIPATION QUALITY:22265}  Affect:  {BHH AFFECT:22266}  Cognitive:  {BHH COGNITIVE:22267}  Insight: {BHH Insight2:20797}  Engagement in Group:  {BHH ENGAGEMENT IN WGNFA:21308}  Modes of Intervention:  {BHH MODES OF INTERVENTION:22269}  Additional Comments:  ***  [name] 04/07/2023, 10:24 AM

## 2023-04-07 NOTE — Progress Notes (Signed)
  [facility] Adult Case Management Discharge Plan :  Will you be returning to the same living situation after discharge:  Yes,  pt will be going back to his Group Home, [facility]  At discharge, do you have transportation home?: Yes,  [facility] will pick him up  Do you have the ability to pay for your medications: Yes,  MEDICARE / MEDICARE PART A AND B  Release of information consent forms completed and in the chart;  Patient's signature needed at discharge.  Patient to Follow up at:  Follow-up Information     [facility], [facility] Follow up.   Why: Although you declined follow-up with therapy or psychiatry I did want to include the walk-in information for [facility] in case you needed it. Walk-In Crisis Hours: 7 days/week, 8am - 12am for [employer] information: [address] [place] [address] [phone]                 Next level of care provider has access to [facility] Link:no  Safety Planning and Suicide Prevention discussed: No. Pt declined, SPE brochure given to patient and went over with pt prior to discharge      Has patient been referred to the Quitline?: Patient refused referral for treatment  Patient has been referred for addiction treatment: No known substance use disorder.  [address], LCSWA 04/07/2023, 9:59 AM

## 2023-04-11 ENCOUNTER — Ambulatory Visit: Payer: 59

## 2023-04-11 ENCOUNTER — Other Ambulatory Visit: Payer: Medicare Other

## 2023-04-11 ENCOUNTER — Ambulatory Visit: Payer: Medicare Other

## 2023-04-11 ENCOUNTER — Ambulatory Visit: Payer: Medicare Other | Admitting: Oncology

## 2023-04-11 ENCOUNTER — Inpatient Hospital Stay: Payer: 59

## 2023-04-11 ENCOUNTER — Encounter: Payer: Self-pay | Admitting: Oncology

## 2023-04-11 DIAGNOSIS — D689 Coagulation defect, unspecified: Secondary | ICD-10-CM | POA: Insufficient documentation

## 2023-04-11 DIAGNOSIS — I2510 Atherosclerotic heart disease of native coronary artery without angina pectoris: Secondary | ICD-10-CM | POA: Insufficient documentation

## 2023-04-11 DIAGNOSIS — Z5111 Encounter for antineoplastic chemotherapy: Secondary | ICD-10-CM | POA: Insufficient documentation

## 2023-04-11 DIAGNOSIS — Z8673 Personal history of transient ischemic attack (TIA), and cerebral infarction without residual deficits: Secondary | ICD-10-CM | POA: Insufficient documentation

## 2023-04-11 DIAGNOSIS — I700 Atherosclerosis of aorta: Secondary | ICD-10-CM | POA: Insufficient documentation

## 2023-04-11 DIAGNOSIS — R0602 Shortness of breath: Secondary | ICD-10-CM | POA: Insufficient documentation

## 2023-04-11 DIAGNOSIS — I10 Essential (primary) hypertension: Secondary | ICD-10-CM | POA: Insufficient documentation

## 2023-04-11 DIAGNOSIS — K449 Diaphragmatic hernia without obstruction or gangrene: Secondary | ICD-10-CM | POA: Insufficient documentation

## 2023-04-11 DIAGNOSIS — E876 Hypokalemia: Secondary | ICD-10-CM | POA: Insufficient documentation

## 2023-04-11 DIAGNOSIS — F17210 Nicotine dependence, cigarettes, uncomplicated: Secondary | ICD-10-CM | POA: Insufficient documentation

## 2023-04-11 DIAGNOSIS — Z593 Problems related to living in residential institution: Secondary | ICD-10-CM | POA: Insufficient documentation

## 2023-04-11 DIAGNOSIS — Z7902 Long term (current) use of antithrombotics/antiplatelets: Secondary | ICD-10-CM | POA: Insufficient documentation

## 2023-04-11 DIAGNOSIS — J449 Chronic obstructive pulmonary disease, unspecified: Secondary | ICD-10-CM | POA: Insufficient documentation

## 2023-04-11 DIAGNOSIS — K8020 Calculus of gallbladder without cholecystitis without obstruction: Secondary | ICD-10-CM | POA: Insufficient documentation

## 2023-04-11 DIAGNOSIS — Z7982 Long term (current) use of aspirin: Secondary | ICD-10-CM | POA: Insufficient documentation

## 2023-04-11 DIAGNOSIS — K5900 Constipation, unspecified: Secondary | ICD-10-CM | POA: Insufficient documentation

## 2023-04-11 DIAGNOSIS — C3412 Malignant neoplasm of upper lobe, left bronchus or lung: Secondary | ICD-10-CM | POA: Insufficient documentation

## 2023-04-11 DIAGNOSIS — R053 Chronic cough: Secondary | ICD-10-CM | POA: Insufficient documentation

## 2023-04-11 DIAGNOSIS — Z79899 Other long term (current) drug therapy: Secondary | ICD-10-CM | POA: Insufficient documentation

## 2023-04-11 DIAGNOSIS — F209 Schizophrenia, unspecified: Secondary | ICD-10-CM | POA: Insufficient documentation

## 2023-04-11 DIAGNOSIS — D3501 Benign neoplasm of right adrenal gland: Secondary | ICD-10-CM | POA: Insufficient documentation

## 2023-04-12 ENCOUNTER — Ambulatory Visit: Payer: 59

## 2023-04-12 ENCOUNTER — Inpatient Hospital Stay: Payer: 59

## 2023-04-12 ENCOUNTER — Ambulatory Visit
Admission: RE | Admit: 2023-04-12 | Discharge: 2023-04-12 | Disposition: A | Discharge status: Home / Self Care | Payer: 59 | Source: Ambulatory Visit | Attending: Radiation Oncology | Admitting: Radiation Oncology

## 2023-04-12 ENCOUNTER — Inpatient Hospital Stay: Payer: 59 | Admitting: Oncology

## 2023-04-12 DIAGNOSIS — I700 Atherosclerosis of aorta: Secondary | ICD-10-CM | POA: Diagnosis not present

## 2023-04-12 DIAGNOSIS — Z7982 Long term (current) use of aspirin: Secondary | ICD-10-CM | POA: Diagnosis not present

## 2023-04-12 DIAGNOSIS — D3501 Benign neoplasm of right adrenal gland: Secondary | ICD-10-CM | POA: Diagnosis not present

## 2023-04-12 DIAGNOSIS — K8020 Calculus of gallbladder without cholecystitis without obstruction: Secondary | ICD-10-CM | POA: Diagnosis not present

## 2023-04-12 DIAGNOSIS — I10 Essential (primary) hypertension: Secondary | ICD-10-CM | POA: Insufficient documentation

## 2023-04-12 DIAGNOSIS — Z5111 Encounter for antineoplastic chemotherapy: Secondary | ICD-10-CM | POA: Diagnosis present

## 2023-04-12 DIAGNOSIS — Z510 Encounter for antineoplastic radiation therapy: Secondary | ICD-10-CM | POA: Insufficient documentation

## 2023-04-12 DIAGNOSIS — C3412 Malignant neoplasm of upper lobe, left bronchus or lung: Secondary | ICD-10-CM | POA: Insufficient documentation

## 2023-04-12 DIAGNOSIS — J449 Chronic obstructive pulmonary disease, unspecified: Secondary | ICD-10-CM | POA: Insufficient documentation

## 2023-04-12 DIAGNOSIS — Z7902 Long term (current) use of antithrombotics/antiplatelets: Secondary | ICD-10-CM | POA: Insufficient documentation

## 2023-04-12 DIAGNOSIS — R053 Chronic cough: Secondary | ICD-10-CM | POA: Diagnosis not present

## 2023-04-12 DIAGNOSIS — Z8673 Personal history of transient ischemic attack (TIA), and cerebral infarction without residual deficits: Secondary | ICD-10-CM | POA: Diagnosis not present

## 2023-04-12 DIAGNOSIS — F209 Schizophrenia, unspecified: Secondary | ICD-10-CM | POA: Insufficient documentation

## 2023-04-12 DIAGNOSIS — K449 Diaphragmatic hernia without obstruction or gangrene: Secondary | ICD-10-CM | POA: Diagnosis not present

## 2023-04-12 DIAGNOSIS — D689 Coagulation defect, unspecified: Secondary | ICD-10-CM | POA: Diagnosis not present

## 2023-04-12 DIAGNOSIS — Z79899 Other long term (current) drug therapy: Secondary | ICD-10-CM | POA: Diagnosis not present

## 2023-04-12 DIAGNOSIS — F17210 Nicotine dependence, cigarettes, uncomplicated: Secondary | ICD-10-CM | POA: Insufficient documentation

## 2023-04-12 DIAGNOSIS — I2510 Atherosclerotic heart disease of native coronary artery without angina pectoris: Secondary | ICD-10-CM | POA: Diagnosis not present

## 2023-04-12 DIAGNOSIS — K5900 Constipation, unspecified: Secondary | ICD-10-CM | POA: Diagnosis not present

## 2023-04-13 ENCOUNTER — Ambulatory Visit: Payer: 59

## 2023-04-14 ENCOUNTER — Other Ambulatory Visit: Payer: Medicare Other

## 2023-04-14 ENCOUNTER — Ambulatory Visit: Payer: 59

## 2023-04-17 ENCOUNTER — Ambulatory Visit: Payer: 59

## 2023-04-17 DIAGNOSIS — Z5111 Encounter for antineoplastic chemotherapy: Secondary | ICD-10-CM | POA: Diagnosis not present

## 2023-04-18 ENCOUNTER — Ambulatory Visit: Payer: 59

## 2023-04-19 ENCOUNTER — Ambulatory Visit: Payer: 59

## 2023-04-19 ENCOUNTER — Other Ambulatory Visit: Payer: Medicare Other

## 2023-04-19 ENCOUNTER — Ambulatory Visit: Payer: Medicare Other

## 2023-04-19 ENCOUNTER — Encounter: Payer: Self-pay | Admitting: Oncology

## 2023-04-19 ENCOUNTER — Ambulatory Visit: Payer: Medicare Other | Admitting: Oncology

## 2023-04-19 MED FILL — Dexamethasone Sodium Phosphate Inj 100 MG/10ML: INTRAMUSCULAR | Qty: 1 | Status: AC

## 2023-04-20 ENCOUNTER — Telehealth: Payer: Self-pay

## 2023-04-20 ENCOUNTER — Inpatient Hospital Stay (HOSPITAL_BASED_OUTPATIENT_CLINIC_OR_DEPARTMENT_OTHER): Payer: 59 | Admitting: Oncology

## 2023-04-20 ENCOUNTER — Encounter: Payer: Self-pay | Admitting: Oncology

## 2023-04-20 ENCOUNTER — Ambulatory Visit: Payer: 59

## 2023-04-20 ENCOUNTER — Ambulatory Visit: Admission: RE | Admit: 2023-04-20 | Payer: 59 | Source: Ambulatory Visit

## 2023-04-20 ENCOUNTER — Inpatient Hospital Stay: Payer: 59

## 2023-04-20 VITALS — BP 138/88 | HR 85 | Temp 96.0°F

## 2023-04-20 VITALS — BP 126/96 | HR 89 | Temp 97.5°F | Resp 18 | Wt 236.7 lb

## 2023-04-20 DIAGNOSIS — C3492 Malignant neoplasm of unspecified part of left bronchus or lung: Secondary | ICD-10-CM

## 2023-04-20 DIAGNOSIS — Z5111 Encounter for antineoplastic chemotherapy: Secondary | ICD-10-CM | POA: Diagnosis not present

## 2023-04-20 DIAGNOSIS — E876 Hypokalemia: Secondary | ICD-10-CM

## 2023-04-20 DIAGNOSIS — F201 Disorganized schizophrenia: Secondary | ICD-10-CM

## 2023-04-20 DIAGNOSIS — Z720 Tobacco use: Secondary | ICD-10-CM | POA: Diagnosis not present

## 2023-04-20 LAB — CMP (CANCER CENTER ONLY)
ALT: 23 U/L (ref 0–44)
AST: 26 U/L (ref 15–41)
Albumin: 3.8 g/dL (ref 3.5–5.0)
Alkaline Phosphatase: 66 U/L (ref 38–126)
Anion gap: 8 (ref 5–15)
BUN: 7 mg/dL — ABNORMAL LOW (ref 8–23)
CO2: 23 mmol/L (ref 22–32)
Calcium: 9.2 mg/dL (ref 8.9–10.3)
Chloride: 106 mmol/L (ref 98–111)
Creatinine: 0.78 mg/dL (ref 0.61–1.24)
GFR, Estimated: >60 mL/min (ref >60)
Glucose, Bld: 148 mg/dL — ABNORMAL HIGH (ref 70–99)
Potassium: 3.3 mmol/L — ABNORMAL LOW (ref 3.5–5.1)
Sodium: 137 mmol/L (ref 135–145)
Total Bilirubin: 0.6 mg/dL (ref 0.3–1.2)
Total Protein: 6.9 g/dL (ref 6.5–8.1)

## 2023-04-20 LAB — CBC WITH DIFFERENTIAL (CANCER CENTER ONLY)
Abs Immature Granulocytes: 0.03 10*3/uL (ref 0.00–0.07)
Basophils Absolute: 0 10*3/uL (ref 0.0–0.1)
Basophils Relative: 0 %
Eosinophils Absolute: 0.1 10*3/uL (ref 0.0–0.5)
Eosinophils Relative: 1 %
HCT: 36.3 % — ABNORMAL LOW (ref 39.0–52.0)
Hemoglobin: 11.9 g/dL — ABNORMAL LOW (ref 13.0–17.0)
Immature Granulocytes: 0 %
Lymphocytes Relative: 32 %
Lymphs Abs: 2.2 10*3/uL (ref 0.7–4.0)
MCH: 30 pg (ref 26.0–34.0)
MCHC: 32.8 g/dL (ref 30.0–36.0)
MCV: 91.4 fL (ref 80.0–100.0)
Monocytes Absolute: 0.5 10*3/uL (ref 0.1–1.0)
Monocytes Relative: 7 %
Neutro Abs: 4 10*3/uL (ref 1.7–7.7)
Neutrophils Relative %: 60 %
Platelet Count: 208 10*3/uL (ref 150–400)
RBC: 3.97 MIL/uL — ABNORMAL LOW (ref 4.22–5.81)
RDW: 14.2 % (ref 11.5–15.5)
WBC Count: 6.8 10*3/uL (ref 4.0–10.5)
nRBC: 0 % (ref 0.0–0.2)

## 2023-04-20 MED ORDER — PROCHLORPERAZINE MALEATE 10 MG PO TABS: 10.0000 mg | ORAL_TABLET | Freq: Four times a day (QID) | ORAL | 0 refills | Status: DC | PRN

## 2023-04-20 MED ORDER — SODIUM CHLORIDE 0.9 % IV SOLN
Freq: Once | INTRAVENOUS | Status: AC
  Filled 2023-04-20: qty 250

## 2023-04-20 MED ORDER — SODIUM CHLORIDE 0.9 % IV SOLN
10.0000 mg | Freq: Once | INTRAVENOUS | Status: AC
  Administered 2023-04-20: 10 mg via INTRAVENOUS
  Filled 2023-04-20: qty 10

## 2023-04-20 MED ORDER — SODIUM CHLORIDE 0.9 % IV SOLN
45.0000 mg/m2 | Freq: Once | INTRAVENOUS | Status: AC
  Administered 2023-04-20: 102 mg via INTRAVENOUS
  Filled 2023-04-20: qty 17

## 2023-04-20 MED ORDER — SODIUM CHLORIDE 0.9 % IV SOLN
250.0000 mg | Freq: Once | INTRAVENOUS | Status: AC
  Administered 2023-04-20: 250 mg via INTRAVENOUS
  Filled 2023-04-20: qty 25

## 2023-04-20 MED ORDER — PALONOSETRON HCL INJECTION 0.25 MG/5ML
0.2500 mg | Freq: Once | INTRAVENOUS | Status: AC
  Administered 2023-04-20: 0.25 mg via INTRAVENOUS
  Filled 2023-04-20: qty 5

## 2023-04-20 MED ORDER — POTASSIUM CHLORIDE CRYS ER 10 MEQ PO TBCR: 10.0000 meq | EXTENDED_RELEASE_TABLET | Freq: Every day | ORAL | 0 refills | Status: DC

## 2023-04-20 MED ORDER — DIPHENHYDRAMINE HCL 50 MG/ML IJ SOLN
50.0000 mg | Freq: Once | INTRAMUSCULAR | Status: AC
  Administered 2023-04-20: 50 mg via INTRAVENOUS
  Filled 2023-04-20: qty 1

## 2023-04-20 MED ORDER — FAMOTIDINE IN NACL 20-0.9 MG/50ML-% IV SOLN
20.0000 mg | Freq: Once | INTRAVENOUS | Status: AC
  Administered 2023-04-20: 20 mg via INTRAVENOUS
  Filled 2023-04-20: qty 50

## 2023-04-20 NOTE — Assessment & Plan Note (Signed)
Recommend smoke cessation.  

## 2023-04-20 NOTE — Assessment & Plan Note (Signed)
Chemotherapy plan as listed above 

## 2023-04-20 NOTE — Assessment & Plan Note (Signed)
Patient lives in group home.  Continue follow-up with primary care provider. On Seroquel and mirtazapine

## 2023-04-20 NOTE — Addendum Note (Signed)
Addended by: [name] on: 04/20/2023 09:55 AM   Modules accepted: Orders

## 2023-04-20 NOTE — Patient Instructions (Signed)
[facility]  Discharge Instructions: Thank you for choosing [facility] to provide your oncology and hematology care.  If you have a lab appointment with the Cancer Center, please go directly to the Cancer Center and check in at the registration area.  Wear comfortable clothing and clothing appropriate for easy access to any Portacath or PICC line.   We strive to give you quality time with your provider. You may need to reschedule your appointment if you arrive late (15 or more minutes).  Arriving late affects you and other patients whose appointments are after yours.  Also, if you miss three or more appointments without notifying the office, you may be dismissed from the clinic at the provider's discretion.      For prescription refill requests, have your pharmacy contact our office and allow 72 hours for refills to be completed.    Today you received the following chemotherapy and/or immunotherapy agents Taxol & Carboplatin    To help prevent nausea and vomiting after your treatment, we encourage you to take your nausea medication as directed.  BELOW ARE SYMPTOMS THAT SHOULD BE REPORTED IMMEDIATELY: *FEVER GREATER THAN 100.4 F (38 C) OR HIGHER *CHILLS OR SWEATING *NAUSEA AND VOMITING THAT IS NOT CONTROLLED WITH YOUR NAUSEA MEDICATION *UNUSUAL SHORTNESS OF BREATH *UNUSUAL BRUISING OR BLEEDING *URINARY PROBLEMS (pain or burning when urinating, or frequent urination) *BOWEL PROBLEMS (unusual diarrhea, constipation, pain near the anus) TENDERNESS IN MOUTH AND THROAT WITH OR WITHOUT PRESENCE OF ULCERS (sore throat, sores in mouth, or a toothache) UNUSUAL RASH, SWELLING OR PAIN  UNUSUAL VAGINAL DISCHARGE OR ITCHING   Items with * indicate a potential emergency and should be followed up as soon as possible or go to the Emergency Department if any problems should occur.  Please show the CHEMOTHERAPY ALERT CARD or IMMUNOTHERAPY ALERT CARD at  check-in to the Emergency Department and triage nurse.  Should you have questions after your visit or need to cancel or reschedule your appointment, please contact [facility]  [phone] and follow the prompts.  Office hours are 8:00 a.m. to 4:30 p.m. Monday - Friday. Please note that voicemails left after 4:00 p.m. may not be returned until the following business day.  We are closed weekends and major holidays. You have access to a nurse at all times for urgent questions. Please call the main number to the clinic [phone] and follow the prompts.  For any non-urgent questions, you may also contact your provider using MyChart. We now offer e-Visits for anyone 66 and older to request care online for non-urgent symptoms. For details visit [email or web address].   Also download the MyChart app! Go to the app store, search "MyChart", open the app, select [facility], and log in with your MyChart username and password.

## 2023-04-20 NOTE — Assessment & Plan Note (Signed)
Recommend patient to start potassium chloride daily.

## 2023-04-20 NOTE — Progress Notes (Signed)
Hematology/Oncology Progress note [phone] [phone]        REFERRING PROVIDER: [name], MD    CHIEF COMPLAINTS/PURPOSE OF CONSULTATION:  Mass  ASSESSMENT & PLAN:   Cancer Staging  Adenocarcinoma of left lung [facility]) Staging form: Lung, AJCC 8th Edition - Clinical stage from 03/22/2023: cT1, cN3, cM0 - Signed by [name], MD on 03/22/2023   Adenocarcinoma of left lung [facility]) CT scan and PET scan results were reviewed and discussed with patient. He has right paratracheal lymph node hypermetabolic activity Bronchoscopy showed Left upper lobe adenocarcinoma, Station 7 lymph node showed atypical cell No enough tissue for NGS, will send off liquid biopsy- send off liquid biopsy.  Clinically he has Stage III left lung cancer. Case discussed on tumor board and consensus reached on concurrent chemotherapy with radiation followed by immunotherapy. Labs are reviewed and discussed with patient. Proceed with carboplatin AUC 2 and taxol 45mg /m2 today.   Schizophrenia (HCC) Patient lives in group home.  Continue follow-up with primary care provider. On Seroquel and mirtazapine   Tobacco use Recommend smoke cessation.  Encounter for antineoplastic chemotherapy Chemotherapy plan as listed above.   Hypokalemia Recommend patient to start potassium chloride daily.   Orders Placed This Encounter  Procedures   CBC with Differential (Cancer Center Only)    Standing Status:   Future    Standing Expiration Date:   05/03/2024   CMP (Cancer Center only)    Standing Status:   Future    Standing Expiration Date:   05/03/2024   Follow up 1 week lab MD chemo  All questions were answered. The patient knows to call the clinic with any problems, questions or concerns.  [name], MD, PhD [facility] Hematology Oncology 04/20/2023    HISTORY OF PRESENTING ILLNESS:  [name] 62 y.o. male presents to establish care for lung mass I have reviewed his chart and  materials related to his cancer extensively and collaborated history with the patient. Summary of oncologic history is as follows:  12/08/2021 CT chest with contrast showed irregular solid pulmonary nodule of right upper lobe, increased in size.  Bibasilar consolidation.  Mildly enlarged right lower paratracheal lymph node.  Atherosclerosis. 01/04/2022 PET scan showed 10 mm irregular nodule in the lateral right upper lobe, suspicious for primary bronchogenic neoplasm. 2 small right paratracheal nodes, indeterminate and favored to be reactive.  Attention on follow-up.  Patient was seen by radiation oncology Dr. Rushie Chestnut.  Patient was offered SBRT to the right upper lobe nodule/presumed non-small cell lung cancer stage I.  Patient lives in a group home.  He reports chronic cough.  Some shortness of breath with exertion. He is a current everyday smoker.  Few cigarettes per day.  He denies any unintentional weight loss, night sweats, fever or chills. Patient is on aspirin and Plavix   INTERVAL HISTORY [name] is a 62 y.o. male who has above history reviewed by me today presents for follow up visit for lung cancer.  Oncology History  Adenocarcinoma of left lung (HCC)  12/29/2022 Imaging   CT chest with contrast showed Stable radiation changes involving the right upper lobe with a small residual treated nodule.  Decrease in size.  Interval enlargement of the left upper lobe pulmonary nodule.  Stable mediastinal nodes stable advanced emphysematous changes and areas of pulmonary scarring.  Stable bilateral adrenal gland adenomas.  Emphysema   01/30/2023 Imaging   PET 1. Recurrent bronchogenic carcinoma in the left upper lobe with new  and increasingly hypermetabolic mediastinal lymph nodes. No evidence of distant metastatic disease. 2. Possible mild prostate hypermetabolism. Consider laboratory correlation. 3. Liver is mildly heterogeneous, raising suspicion for steatosis. 4. Cholelithiasis. 5.  Right adrenal adenoma.  Left adrenal myelolipoma. 6. Aortic atherosclerosis (ICD10-I70.0). Coronary artery calcification. 7. Enlarged pulmonic trunk, indicative of pulmonary arterial hypertension.   03/15/2023 Initial Diagnosis   Adenocarcinoma of left lung   Bronchoscopy showed  Lung, biopsy, Left upper lobe - NON-SMALL CELL CARCINOMA, FAVOR ADENOCARCINOMA.  1. Bronchial Lavage, Left upper lobe - POSITIVE FOR MALIGNANCY. - NON-SMALL CELL CARCINOMA, FAVOR ADENOCARCINOMA. - SEE NOTE. 2. Bronchial Brushing, Left upper lobe - POSITIVE FOR MALIGNANCY. - NON-SMALL CELL CARCINOMA, FAVOR ADENOCARCINOMA. 3. Bronchus, biopsy, left upper lobe - SEE [id number] 4. Fine Needle Aspiration, left upper lobe - POSITIVE FOR MALIGNANCY. - NON-SMALL CELL CARCINOMA, FAVOR ADENOCARCINOMA. 5. Fine Needle Aspiration, station 7 lymph node - ATYPICAL. - BACKGROUND LYMPHOID TISSUE IS PRESENT CONSISTENT WITH LYMPH NODE SAMPLING.    03/15/2023 Procedure   Fiberoptic bronchoscopy with airway inspection and BAL Procedure findings:   Bronchoscope was inserted via ETT  without difficulty.  Posterior oropharynx, epiglottis, arytenoids, false cords and vocal cords were not visualized as these were bypassed by endotracheal tube. The distal trachea was normal in circumference and appearance without mucosal, cartilaginous or branching abnormalities.  The main carina was mildly splayed . All right and left lobar airways were visualized to the Subsegmental level.  Sub- sub segmental carinae were identified in all the distal airways.   Secretions were visible in the following airways and appeared to be clear.  The mucosa was : friable at LEFT UPPER LOBE   Airways were notable for:        exophytic lesions :n       extrinsic compression in the following distributions: n.       Friable mucosa: y       Teacher, music /pigmentation: n   MUCUS PLUGGING WAS SEVERE ON LEFT SIDE AND COMPLETE OBSTRUCTED MULTIPLE  AIRWAYS.  UNABLE TO NAVIGATE THROUGH TRACHEOBRONCHIAL TREE DUE TO SEVERE MUCUS PLUGGING. THERAPEUTIC ASPIRATION WAS PERFORMED X 7 AND WAS DONE BILATERALLY ALTHOUGH WORSE ON LEFT.  BAL WAS PERFORMED AT LEFT UPPER LOBE X 2.     Endobronchial ultrasound assisted hilar and mediastinal lymph node biopsies procedure findings: The fiberoptic bronchoscope was removed and the EBUS scope was introduced. Examination began to evaluate for pathologically enlarged lymph nodes starting on the RIGHT  side progressing to the LEFT side.  All lymph node biopsies performed with 21G  needle. Lymph node biopsies were sent in cytolite for all stations.   STATION 10R - 6mm not biopsied STATION 7 - 1.2cm biopsied 3 times STATION 10L - 5mm not biopsied  STATION 4L - 6mm not biopsied    03/22/2023 Cancer Staging   Staging form: Lung, AJCC 8th Edition - Clinical stage from 03/22/2023: cT1, cN3, cM0 - Signed by [name], MD on 03/22/2023 Stage prefix: Initial diagnosis   04/20/2023 -  Chemotherapy   Patient is on Treatment Plan : LUNG Carboplatin + Paclitaxel + XRT q7d        Patient presents for evaluation prior to chemotherapy. He starts radiation today.  No new complaints.     MEDICAL HISTORY:  Past Medical History:  Diagnosis Date   Acute on chronic respiratory failure with hypoxia (HCC)    Asthma    Coagulation disorder (HCC)    COPD (chronic obstructive pulmonary disease) (HCC)    Depression  History of hiatal hernia    Hypertension    Hypokalemia    Leukocytosis    Lung mass    Pneumonia    Schizophrenia (HCC)    Shortness of breath dyspnea    Stroke [facility])    Umbilical hernia    Ventral hernia     SURGICAL HISTORY: Past Surgical History:  Procedure Laterality Date   COLONOSCOPY WITH PROPOFOL N/A 09/21/2021   Procedure: COLONOSCOPY WITH PROPOFOL;  Surgeon: Regis Bill, MD;  Location: [facility] ENDOSCOPY;  Service: Endoscopy;  Laterality: N/A;   HERNIA REPAIR     INSERTION OF MESH N/A  12/18/2014   Procedure: INSERTION OF MESH;  Surgeon: Lattie Haw, MD;  Location: [facility] ORS;  Service: General;  Laterality: N/A;   SUPRA-UMBILICAL HERNIA  12/18/2014   Procedure: SUPRA-UMBILICAL HERNIA;  Surgeon: Lattie Haw, MD;  Location: [facility] ORS;  Service: General;;   UMBILICAL HERNIA REPAIR N/A 12/18/2014   Procedure: HERNIA REPAIR UMBILICAL ADULT;  Surgeon: Lattie Haw, MD;  Location: [facility] ORS;  Service: General;  Laterality: N/A;   VIDEO BRONCHOSCOPY WITH ENDOBRONCHIAL ULTRASOUND N/A 03/15/2023   Procedure: VIDEO BRONCHOSCOPY WITH ENDOBRONCHIAL ULTRASOUND;  Surgeon: Vida Rigger, MD;  Location: [facility] ORS;  Service: Thoracic;  Laterality: N/A;    SOCIAL HISTORY: Social History   Socioeconomic History   Marital status: Widowed    Spouse name: Not on file   Number of children: Not on file   Years of education: Not on file   Highest education level: Not on file  Occupational History   Not on file  Tobacco Use   Smoking status: Every Day    Current packs/day: 0.15    Average packs/day: 0.2 packs/day for 45.0 years (6.8 ttl pk-yrs)    Types: Cigarettes   Smokeless tobacco: Never  Vaping Use   Vaping status: Never Used  Substance and Sexual Activity   Alcohol use: Not Currently    Alcohol/week: 75.0 standard drinks of alcohol    Types: 75 Cans of beer per week   Drug use: Not Currently    Types: Marijuana, "Crack" cocaine    Comment: none in 15 yrs   Sexual activity: Not Currently  Other Topics Concern   Not on file  Social History Narrative   Not on file   Social Determinants of Health   Financial Resource Strain: Not on file  Food Insecurity: No Food Insecurity (03/25/2023)   Hunger Vital Sign    Worried About Running Out of Food in the Last Year: Never true    Ran Out of Food in the Last Year: Never true  Transportation Needs: No Transportation Needs (03/25/2023)   PRAPARE - Administrator, Civil Service (Medical): No    Lack of  Transportation (Non-Medical): No  Physical Activity: Not on file  Stress: Not on file  Social Connections: Not on file  Intimate Partner Violence: Not At Risk (03/25/2023)   Humiliation, Afraid, Rape, and Kick questionnaire    Fear of Current or Ex-Partner: No    Emotionally Abused: No    Physically Abused: No    Sexually Abused: No    FAMILY HISTORY: Family History  Problem Relation Age of Onset   Asthma Mother    Hypertension Father     ALLERGIES:  has No Known Allergies.  MEDICATIONS:  Current Outpatient Medications  Medication Sig Dispense Refill   albuterol (VENTOLIN HFA) 108 (90 Base) MCG/ACT inhaler Inhale 2 puffs into the lungs every 4 (four) hours  as needed for wheezing or shortness of breath.     aspirin EC 81 MG tablet Take 81 mg by mouth daily. Swallow whole.     clonazePAM (KLONOPIN) 1 MG tablet Take 1 tablet (1 mg total) by mouth at bedtime. 30 tablet 0   clopidogrel (PLAVIX) 75 MG tablet Take 1 tablet (75 mg total) by mouth daily. 30 tablet 3   diltiazem (CARDIZEM CD) 240 MG 24 hr capsule Take 1 capsule (240 mg total) by mouth daily. 30 capsule 3   docusate sodium (COLACE) 100 MG capsule Take 100 mg by mouth 2 (two) times daily as needed for mild constipation.     famotidine (PEPCID) 20 MG tablet Take 1 tablet (20 mg total) by mouth daily. 30 tablet 0   Fluticasone-Umeclidin-Vilant (TRELEGY ELLIPTA) 100-62.5-25 MCG/ACT AEPB Inhale 1 puff into the lungs daily. 28 each 1   guaiFENesin (MUCINEX) 600 MG 12 hr tablet Take 600 mg by mouth 2 (two) times daily as needed.     ipratropium-albuterol (DUONEB) 0.5-2.5 (3) MG/3ML SOLN Take 3 mLs by nebulization every 6 (six) hours as needed (every 4 to 6 hours as needed for shortnes of breath or wheeziing).      ketoconazole (NIZORAL) 2 % cream Apply to both feet and between toes once daily for 6 weeks. 60 g 1   lovastatin (MEVACOR) 20 MG tablet Take 20 mg by mouth at bedtime.     mirtazapine (REMERON) 30 MG tablet Take 1 tablet  (30 mg total) by mouth at bedtime. 30 tablet 3   montelukast (SINGULAIR) 10 MG tablet Take 1 tablet (10 mg total) by mouth at bedtime. 30 tablet 1   Multiple Vitamins-Minerals (MULTIVITAMIN WITH MINERALS) tablet Take 1 tablet by mouth daily.     Omega-3 Fatty Acids (FISH OIL) 1000 MG CPDR Take 1,000 mg by mouth at bedtime.     paliperidone (INVEGA) 9 MG 24 hr tablet Take 1 tablet (9 mg total) by mouth at bedtime. 30 tablet 3   potassium chloride (KLOR-CON M) 10 MEQ tablet Take 1 tablet (10 mEq total) by mouth daily. 30 tablet 0   Pyridoxine HCl (B-6) 100 MG TABS Take 1 tablet by mouth daily.     traZODone (DESYREL) 150 MG tablet Take 1 tablet (150 mg total) by mouth at bedtime as needed for sleep. 30 tablet 3   VITAMIN D, ERGOCALCIFEROL, PO Take 1,000 Units by mouth daily.      dextromethorphan-guaiFENesin (ROBITUSSIN-DM) 10-100 MG/5ML liquid Take 10 mLs by mouth every 6 (six) hours as needed for cough. (Patient not taking: Reported on 03/07/2023)     No current facility-administered medications for this visit.   Facility-Administered Medications Ordered in Other Visits  Medication Dose Route Frequency Provider Last Rate Last Admin   0.9 %  sodium chloride infusion   Intravenous Once [name], MD       CARBOplatin (PARAPLATIN) 250 mg in sodium chloride 0.9 % 100 mL chemo infusion  250 mg Intravenous Once [name], MD       dexamethasone (DECADRON) 10 mg in sodium chloride 0.9 % 50 mL IVPB  10 mg Intravenous Once [name], MD       diphenhydrAMINE (BENADRYL) injection 50 mg  50 mg Intravenous Once [name], MD       famotidine (PEPCID) IVPB 20 mg premix  20 mg Intravenous Once [name], MD       PACLitaxel (TAXOL) 102 mg in sodium chloride 0.9 % 250 mL chemo infusion (</=  80mg /m2)  45 mg/m2 (Treatment Plan Recorded) Intravenous Once [name], MD       palonosetron (ALOXI) injection 0.25 mg  0.25 mg Intravenous Once [name], MD        Review of Systems  Constitutional:  Negative for appetite  change, chills, fatigue, fever and unexpected weight change.  HENT:   Negative for hearing loss and voice change.   Eyes:  Negative for eye problems and icterus.  Respiratory:  Positive for cough and shortness of breath. Negative for chest tightness and hemoptysis.   Cardiovascular:  Negative for chest pain and leg swelling.  Gastrointestinal:  Negative for abdominal distention and abdominal pain.  Endocrine: Negative for hot flashes.  Genitourinary:  Negative for difficulty urinating, dysuria and frequency.   Musculoskeletal:  Negative for arthralgias.  Skin:  Negative for itching and rash.  Neurological:  Negative for light-headedness and numbness.  Hematological:  Negative for adenopathy. Does not bruise/bleed easily.  Psychiatric/Behavioral:  Negative for confusion.      PHYSICAL EXAMINATION: ECOG PERFORMANCE STATUS: 0 - Asymptomatic  Vitals:   04/20/23 0841  BP: (!) 126/96  Pulse: 89  Resp: 18  Temp: (!) 97.5 F (36.4 C)   Filed Weights   04/20/23 0841  Weight: 236 lb 11.2 oz (107.4 kg)    Physical Exam Constitutional:      General: He is not in acute distress.    Appearance: He is not diaphoretic.  HENT:     Head: Normocephalic and atraumatic.  Eyes:     General: No scleral icterus. Cardiovascular:     Rate and Rhythm: Normal rate and regular rhythm.  Pulmonary:     Effort: Pulmonary effort is normal. No respiratory distress.     Comments: Decreased breath sound bilaterally. Abdominal:     General: There is no distension.     Palpations: Abdomen is soft.     Tenderness: There is no abdominal tenderness.  Musculoskeletal:        General: Normal range of motion.     Cervical back: Normal range of motion and neck supple.  Skin:    General: Skin is warm and dry.     Findings: No erythema.  Neurological:     Mental Status: He is alert and oriented to person, place, and time. Mental status is at baseline.     Cranial Nerves: No cranial nerve deficit.      Motor: No abnormal muscle tone.     Coordination: Coordination normal.  Psychiatric:        Mood and Affect: Affect normal.     Comments: Flat      LABORATORY DATA:  I have reviewed the data as listed    Latest Ref Rng & Units 04/20/2023    8:22 AM 03/24/2023    8:32 AM 02/03/2023   12:17 PM  CBC  WBC 4.0 - 10.5 K/uL 6.8  7.8  8.2   Hemoglobin 13.0 - 17.0 g/dL 61.6  07.3  71.0   Hematocrit 39.0 - 52.0 % 36.3  39.4  40.5   Platelets 150 - 400 K/uL 208  187  219       Latest Ref Rng & Units 04/20/2023    8:21 AM 03/24/2023    8:32 AM 02/03/2023   12:17 PM  CMP  Glucose 70 - 99 mg/dL 626  948  546   BUN 8 - 23 mg/dL 7  9  7    Creatinine 0.61 - 1.24 mg/dL 2.70  3.50  0.93  Sodium 135 - 145 mmol/L 137  138  137   Potassium 3.5 - 5.1 mmol/L 3.3  3.4  3.6   Chloride 98 - 111 mmol/L 106  105  103   CO2 22 - 32 mmol/L 23  25  23    Calcium 8.9 - 10.3 mg/dL 9.2  9.2  9.8   Total Protein 6.5 - 8.1 g/dL 6.9  7.0  7.4   Total Bilirubin 0.3 - 1.2 mg/dL 0.6  0.7  0.4   Alkaline Phos 38 - 126 U/L 66  70  83   AST 15 - 41 U/L 26  18  21    ALT 0 - 44 U/L 23  21  20       RADIOGRAPHIC STUDIES: I have personally reviewed the radiological images as listed and agreed with the findings in the report. No results found.

## 2023-04-20 NOTE — Assessment & Plan Note (Addendum)
CT scan and PET scan results were reviewed and discussed with patient. He has right paratracheal lymph node hypermetabolic activity Bronchoscopy showed Left upper lobe adenocarcinoma, Station 7 lymph node showed atypical cell No enough tissue for NGS, will send off liquid biopsy- send off liquid biopsy.  Clinically he has Stage III left lung cancer. Case discussed on tumor board and consensus reached on concurrent chemotherapy with radiation followed by immunotherapy. Labs are reviewed and discussed with patient. Proceed with carboplatin AUC 2 and taxol 45mg /m2 today.

## 2023-04-20 NOTE — Telephone Encounter (Signed)
[facility]us Liquid biopsy (xF) requested. Blood collected today and taken to [employer] box for shipping.   Financial application was submitted and approved. Pt's out of-of-pocket cost will be $0.

## 2023-04-21 ENCOUNTER — Ambulatory Visit: Payer: 59

## 2023-04-24 ENCOUNTER — Ambulatory Visit: Payer: 59

## 2023-04-24 ENCOUNTER — Ambulatory Visit
Admission: RE | Admit: 2023-04-24 | Discharge: 2023-04-24 | Disposition: A | Discharge status: Home / Self Care | Payer: 59 | Source: Ambulatory Visit | Attending: Radiation Oncology | Admitting: Radiation Oncology

## 2023-04-24 ENCOUNTER — Other Ambulatory Visit: Payer: Self-pay

## 2023-04-24 ENCOUNTER — Telehealth: Payer: Self-pay

## 2023-04-24 DIAGNOSIS — Z5111 Encounter for antineoplastic chemotherapy: Secondary | ICD-10-CM | POA: Diagnosis not present

## 2023-04-24 LAB — RAD ONC ARIA SESSION SUMMARY
Course Elapsed Days: 0
Plan Fractions Treated to Date: 1
Plan Prescribed Dose Per Fraction: 2 Gy
Plan Total Fractions Prescribed: 35
Plan Total Prescribed Dose: 70 Gy
Reference Point Dosage Given to Date: 2 Gy
Reference Point Session Dosage Given: 2 Gy
Session Number: 1

## 2023-04-24 NOTE — Telephone Encounter (Signed)
Telephone call to patient for follow up after receiving first infusion.   Patient caregiver states infusion went great.  States eating good and drinking plenty of fluids.   Denies any nausea or vomiting.  Encouraged patient caregiver to call for any concerns or questions.

## 2023-04-25 ENCOUNTER — Encounter: Payer: Self-pay | Admitting: Oncology

## 2023-04-25 ENCOUNTER — Other Ambulatory Visit: Payer: Self-pay

## 2023-04-25 ENCOUNTER — Ambulatory Visit: Payer: 59

## 2023-04-25 ENCOUNTER — Ambulatory Visit
Admission: RE | Admit: 2023-04-25 | Discharge: 2023-04-25 | Disposition: A | Discharge status: Home / Self Care | Payer: 59 | Source: Ambulatory Visit | Attending: Radiation Oncology | Admitting: Radiation Oncology

## 2023-04-25 DIAGNOSIS — Z5111 Encounter for antineoplastic chemotherapy: Secondary | ICD-10-CM | POA: Diagnosis not present

## 2023-04-25 LAB — RAD ONC ARIA SESSION SUMMARY
Course Elapsed Days: 1
Plan Fractions Treated to Date: 2
Plan Prescribed Dose Per Fraction: 2 Gy
Plan Total Fractions Prescribed: 35
Plan Total Prescribed Dose: 70 Gy
Reference Point Dosage Given to Date: 4 Gy
Reference Point Session Dosage Given: 2 Gy
Session Number: 2

## 2023-04-26 ENCOUNTER — Ambulatory Visit
Admission: RE | Admit: 2023-04-26 | Discharge: 2023-04-26 | Disposition: A | Discharge status: Home / Self Care | Payer: 59 | Source: Ambulatory Visit | Attending: Radiation Oncology | Admitting: Radiation Oncology

## 2023-04-26 ENCOUNTER — Ambulatory Visit: Payer: Medicare Other | Admitting: Oncology

## 2023-04-26 ENCOUNTER — Ambulatory Visit: Payer: 59

## 2023-04-26 ENCOUNTER — Other Ambulatory Visit: Payer: Self-pay

## 2023-04-26 ENCOUNTER — Other Ambulatory Visit: Payer: Medicare Other

## 2023-04-26 ENCOUNTER — Ambulatory Visit: Payer: Medicare Other

## 2023-04-26 DIAGNOSIS — Z5111 Encounter for antineoplastic chemotherapy: Secondary | ICD-10-CM | POA: Diagnosis not present

## 2023-04-26 LAB — RAD ONC ARIA SESSION SUMMARY
Course Elapsed Days: 2
Plan Fractions Treated to Date: 3
Plan Prescribed Dose Per Fraction: 2 Gy
Plan Total Fractions Prescribed: 35
Plan Total Prescribed Dose: 70 Gy
Reference Point Dosage Given to Date: 6 Gy
Reference Point Session Dosage Given: 2 Gy
Session Number: 3

## 2023-04-26 MED FILL — Dexamethasone Sodium Phosphate Inj 100 MG/10ML: INTRAMUSCULAR | Qty: 1 | Status: AC

## 2023-04-27 ENCOUNTER — Inpatient Hospital Stay: Payer: 59

## 2023-04-27 ENCOUNTER — Other Ambulatory Visit: Payer: Self-pay

## 2023-04-27 ENCOUNTER — Other Ambulatory Visit: Payer: Self-pay | Admitting: Oncology

## 2023-04-27 ENCOUNTER — Ambulatory Visit
Admission: RE | Admit: 2023-04-27 | Discharge: 2023-04-27 | Disposition: A | Discharge status: Home / Self Care | Payer: 59 | Source: Ambulatory Visit | Attending: Radiation Oncology | Admitting: Radiation Oncology

## 2023-04-27 ENCOUNTER — Inpatient Hospital Stay (HOSPITAL_BASED_OUTPATIENT_CLINIC_OR_DEPARTMENT_OTHER): Payer: 59 | Admitting: Oncology

## 2023-04-27 ENCOUNTER — Ambulatory Visit: Payer: 59

## 2023-04-27 ENCOUNTER — Encounter: Payer: Self-pay | Admitting: Oncology

## 2023-04-27 VITALS — BP 133/88 | HR 73 | Temp 97.8°F | Resp 20

## 2023-04-27 VITALS — BP 132/94 | HR 99 | Temp 96.0°F | Resp 18 | Wt 236.0 lb

## 2023-04-27 DIAGNOSIS — C3492 Malignant neoplasm of unspecified part of left bronchus or lung: Secondary | ICD-10-CM

## 2023-04-27 DIAGNOSIS — E876 Hypokalemia: Secondary | ICD-10-CM | POA: Diagnosis not present

## 2023-04-27 DIAGNOSIS — Z720 Tobacco use: Secondary | ICD-10-CM | POA: Diagnosis not present

## 2023-04-27 DIAGNOSIS — F201 Disorganized schizophrenia: Secondary | ICD-10-CM

## 2023-04-27 DIAGNOSIS — Z5111 Encounter for antineoplastic chemotherapy: Secondary | ICD-10-CM

## 2023-04-27 DIAGNOSIS — K5900 Constipation, unspecified: Secondary | ICD-10-CM | POA: Insufficient documentation

## 2023-04-27 LAB — RAD ONC ARIA SESSION SUMMARY
Course Elapsed Days: 3
Plan Fractions Treated to Date: 4
Plan Prescribed Dose Per Fraction: 2 Gy
Plan Total Fractions Prescribed: 35
Plan Total Prescribed Dose: 70 Gy
Reference Point Dosage Given to Date: 8 Gy
Reference Point Session Dosage Given: 2 Gy
Session Number: 4

## 2023-04-27 LAB — CMP (CANCER CENTER ONLY)
ALT: 19 U/L (ref 0–44)
AST: 21 U/L (ref 15–41)
Albumin: 4.1 g/dL (ref 3.5–5.0)
Alkaline Phosphatase: 62 U/L (ref 38–126)
Anion gap: 8 (ref 5–15)
BUN: 10 mg/dL (ref 8–23)
CO2: 25 mmol/L (ref 22–32)
Calcium: 9.6 mg/dL (ref 8.9–10.3)
Chloride: 104 mmol/L (ref 98–111)
Creatinine: 0.71 mg/dL (ref 0.61–1.24)
GFR, Estimated: >60 mL/min (ref >60)
Glucose, Bld: 96 mg/dL (ref 70–99)
Potassium: 4 mmol/L (ref 3.5–5.1)
Sodium: 137 mmol/L (ref 135–145)
Total Bilirubin: 0.4 mg/dL (ref 0.3–1.2)
Total Protein: 7.1 g/dL (ref 6.5–8.1)

## 2023-04-27 LAB — CBC WITH DIFFERENTIAL (CANCER CENTER ONLY)
Abs Immature Granulocytes: 0.05 10*3/uL (ref 0.00–0.07)
Basophils Absolute: 0.1 10*3/uL (ref 0.0–0.1)
Basophils Relative: 1 %
Eosinophils Absolute: 0.1 10*3/uL (ref 0.0–0.5)
Eosinophils Relative: 2 %
HCT: 35.8 % — ABNORMAL LOW (ref 39.0–52.0)
Hemoglobin: 11.6 g/dL — ABNORMAL LOW (ref 13.0–17.0)
Immature Granulocytes: 1 %
Lymphocytes Relative: 27 %
Lymphs Abs: 2 10*3/uL (ref 0.7–4.0)
MCH: 29.6 pg (ref 26.0–34.0)
MCHC: 32.4 g/dL (ref 30.0–36.0)
MCV: 91.3 fL (ref 80.0–100.0)
Monocytes Absolute: 0.4 10*3/uL (ref 0.1–1.0)
Monocytes Relative: 5 %
Neutro Abs: 4.7 10*3/uL (ref 1.7–7.7)
Neutrophils Relative %: 64 %
Platelet Count: 204 10*3/uL (ref 150–400)
RBC: 3.92 MIL/uL — ABNORMAL LOW (ref 4.22–5.81)
RDW: 14.2 % (ref 11.5–15.5)
WBC Count: 7.4 10*3/uL (ref 4.0–10.5)
nRBC: 0 % (ref 0.0–0.2)

## 2023-04-27 MED ORDER — PALONOSETRON HCL INJECTION 0.25 MG/5ML
0.2500 mg | Freq: Once | INTRAVENOUS | Status: AC
  Administered 2023-04-27: 0.25 mg via INTRAVENOUS
  Filled 2023-04-27: qty 5

## 2023-04-27 MED ORDER — SODIUM CHLORIDE 0.9 % IV SOLN
45.0000 mg/m2 | Freq: Once | INTRAVENOUS | Status: AC
  Administered 2023-04-27: 102 mg via INTRAVENOUS
  Filled 2023-04-27: qty 17

## 2023-04-27 MED ORDER — SODIUM CHLORIDE 0.9 % IV SOLN
10.0000 mg | Freq: Once | INTRAVENOUS | Status: AC
  Administered 2023-04-27: 10 mg via INTRAVENOUS
  Filled 2023-04-27: qty 10

## 2023-04-27 MED ORDER — DIPHENHYDRAMINE HCL 50 MG/ML IJ SOLN
50.0000 mg | Freq: Once | INTRAMUSCULAR | Status: AC
  Administered 2023-04-27: 50 mg via INTRAVENOUS
  Filled 2023-04-27: qty 1

## 2023-04-27 MED ORDER — SODIUM CHLORIDE 0.9 % IV SOLN
Freq: Once | INTRAVENOUS | Status: AC
  Filled 2023-04-27: qty 250

## 2023-04-27 MED ORDER — SODIUM CHLORIDE 0.9 % IV SOLN
300.0000 mg | Freq: Once | INTRAVENOUS | Status: AC
  Administered 2023-04-27: 300 mg via INTRAVENOUS
  Filled 2023-04-27: qty 30

## 2023-04-27 MED ORDER — FAMOTIDINE IN NACL 20-0.9 MG/50ML-% IV SOLN
20.0000 mg | Freq: Once | INTRAVENOUS | Status: AC
  Administered 2023-04-27: 20 mg via INTRAVENOUS
  Filled 2023-04-27: qty 50

## 2023-04-27 MED ORDER — LACTULOSE 10 GM/15ML PO SOLN: 10.0000 g | Freq: Every day | ORAL | 0 refills | Status: DC | PRN

## 2023-04-27 NOTE — Assessment & Plan Note (Signed)
Encourage his cessation effort. - stopped in early Sept.

## 2023-04-27 NOTE — Assessment & Plan Note (Signed)
Patient lives in group home.  Continue follow-up with primary care provider. On Seroquel and mirtazapine

## 2023-04-27 NOTE — Progress Notes (Addendum)
Hematology/Oncology Progress note [phone] [phone]        REFERRING PROVIDER: [name], MD    CHIEF COMPLAINTS/PURPOSE OF CONSULTATION:  Stage III Lung cancer  ASSESSMENT & PLAN:   Cancer Staging  Adenocarcinoma of left lung [facility]) Staging form: Lung, AJCC 8th Edition - Clinical stage from 03/22/2023: cT1, cN3, cM0 - Signed by [name], MD on 03/22/2023   Adenocarcinoma of left lung [facility]) Clinically he has Stage III left lung cancer CT scan and PET scan results were reviewed and discussed with patient. He has right paratracheal lymph node hypermetabolic activity Bronchoscopy showed Left upper lobe adenocarcinoma, Station 7 lymph node showed atypical cell No enough tissue for NGS, will send off liquid biopsy- send off liquid biopsy.  Labs are reviewed and discussed with patient. Proceed with carboplatin AUC 2 and taxol 45mg /m2 today.   Hypokalemia Recommend patient to start potassium chloride daily.  Schizophrenia (HCC) Patient lives in group home.  Continue follow-up with primary care provider. On Seroquel and mirtazapine   Tobacco use Encourage his cessation effort. - stopped in early Sept.   Encounter for antineoplastic chemotherapy Chemotherapy plan as listed above.   Constipation Refractory to colace and senna.  Recommend Lactulose daily PRN.    Orders Placed This Encounter  Procedures   CBC with Differential (Cancer Center Only)    Standing Status:   Future    Standing Expiration Date:   05/10/2024   CMP (Cancer Center only)    Standing Status:   Future    Standing Expiration Date:   05/10/2024   CBC with Differential (Cancer Center Only)    Standing Status:   Future    Standing Expiration Date:   05/17/2024   CMP (Cancer Center only)    Standing Status:   Future    Standing Expiration Date:   05/17/2024   CBC with Differential (Cancer Center Only)    Standing Status:   Future    Standing Expiration Date:   05/24/2024    CMP (Cancer Center only)    Standing Status:   Future    Standing Expiration Date:   05/24/2024   Follow up 1 week lab MD chemo  All questions were answered. The patient knows to call the clinic with any problems, questions or concerns.  [name], MD, PhD [facility] Hematology Oncology 04/27/2023    HISTORY OF PRESENTING ILLNESS:  [name] 62 y.o. male presents to establish care for lung mass I have reviewed his chart and materials related to his cancer extensively and collaborated history with the patient. Summary of oncologic history is as follows:  12/08/2021 CT chest with contrast showed irregular solid pulmonary nodule of right upper lobe, increased in size.  Bibasilar consolidation.  Mildly enlarged right lower paratracheal lymph node.  Atherosclerosis. 01/04/2022 PET scan showed 10 mm irregular nodule in the lateral right upper lobe, suspicious for primary bronchogenic neoplasm. 2 small right paratracheal nodes, indeterminate and favored to be reactive.  Attention on follow-up.  Patient was seen by radiation oncology Dr. Rushie Chestnut.  Patient was offered SBRT to the right upper lobe nodule/presumed non-small cell lung cancer stage I.  Patient lives in a group home.  He reports chronic cough.  Some shortness of breath with exertion. He is a current everyday smoker.  Few cigarettes per day.  He denies any unintentional weight loss, night sweats, fever or chills. Patient is on aspirin and Plavix   INTERVAL HISTORY [name] is a 62 y.o.  male who has above history reviewed by me today presents for follow up visit for lung cancer.  Oncology History  Adenocarcinoma of left lung (HCC)  12/29/2022 Imaging   CT chest with contrast showed Stable radiation changes involving the right upper lobe with a small residual treated nodule.  Decrease in size.  Interval enlargement of the left upper lobe pulmonary nodule.  Stable mediastinal nodes stable advanced emphysematous changes and areas  of pulmonary scarring.  Stable bilateral adrenal gland adenomas.  Emphysema   01/30/2023 Imaging   PET 1. Recurrent bronchogenic carcinoma in the left upper lobe with new and increasingly hypermetabolic mediastinal lymph nodes. No evidence of distant metastatic disease. 2. Possible mild prostate hypermetabolism. Consider laboratory correlation. 3. Liver is mildly heterogeneous, raising suspicion for steatosis. 4. Cholelithiasis. 5. Right adrenal adenoma.  Left adrenal myelolipoma. 6. Aortic atherosclerosis (ICD10-I70.0). Coronary artery calcification. 7. Enlarged pulmonic trunk, indicative of pulmonary arterial hypertension.   03/15/2023 Initial Diagnosis   Adenocarcinoma of left lung   Bronchoscopy showed  Lung, biopsy, Left upper lobe - NON-SMALL CELL CARCINOMA, FAVOR ADENOCARCINOMA.  1. Bronchial Lavage, Left upper lobe - POSITIVE FOR MALIGNANCY. - NON-SMALL CELL CARCINOMA, FAVOR ADENOCARCINOMA. - SEE NOTE. 2. Bronchial Brushing, Left upper lobe - POSITIVE FOR MALIGNANCY. - NON-SMALL CELL CARCINOMA, FAVOR ADENOCARCINOMA. 3. Bronchus, biopsy, left upper lobe - SEE [id number] 4. Fine Needle Aspiration, left upper lobe - POSITIVE FOR MALIGNANCY. - NON-SMALL CELL CARCINOMA, FAVOR ADENOCARCINOMA. 5. Fine Needle Aspiration, station 7 lymph node - ATYPICAL. - BACKGROUND LYMPHOID TISSUE IS PRESENT CONSISTENT WITH LYMPH NODE SAMPLING.    03/15/2023 Procedure   Fiberoptic bronchoscopy with airway inspection and BAL Procedure findings:   Bronchoscope was inserted via ETT  without difficulty.  Posterior oropharynx, epiglottis, arytenoids, false cords and vocal cords were not visualized as these were bypassed by endotracheal tube. The distal trachea was normal in circumference and appearance without mucosal, cartilaginous or branching abnormalities.  The main carina was mildly splayed . All right and left lobar airways were visualized to the Subsegmental level.  Sub- sub segmental  carinae were identified in all the distal airways.   Secretions were visible in the following airways and appeared to be clear.  The mucosa was : friable at LEFT UPPER LOBE   Airways were notable for:        exophytic lesions :n       extrinsic compression in the following distributions: n.       Friable mucosa: y       Teacher, music /pigmentation: n   MUCUS PLUGGING WAS SEVERE ON LEFT SIDE AND COMPLETE OBSTRUCTED MULTIPLE AIRWAYS.  UNABLE TO NAVIGATE THROUGH TRACHEOBRONCHIAL TREE DUE TO SEVERE MUCUS PLUGGING. THERAPEUTIC ASPIRATION WAS PERFORMED X 7 AND WAS DONE BILATERALLY ALTHOUGH WORSE ON LEFT.  BAL WAS PERFORMED AT LEFT UPPER LOBE X 2.     Endobronchial ultrasound assisted hilar and mediastinal lymph node biopsies procedure findings: The fiberoptic bronchoscope was removed and the EBUS scope was introduced. Examination began to evaluate for pathologically enlarged lymph nodes starting on the RIGHT  side progressing to the LEFT side.  All lymph node biopsies performed with 21G  needle. Lymph node biopsies were sent in cytolite for all stations.   STATION 10R - 6mm not biopsied STATION 7 - 1.2cm biopsied 3 times STATION 10L - 5mm not biopsied  STATION 4L - 6mm not biopsied    03/22/2023 Cancer Staging   Staging form: Lung, AJCC 8th Edition - Clinical stage from 03/22/2023: cT1, cN3,  cM0 - Signed by [name], MD on 03/22/2023 Stage prefix: Initial diagnosis   04/20/2023 -  Chemotherapy   Patient is on Treatment Plan : LUNG Carboplatin + Paclitaxel + XRT q7d        Patient presents for evaluation prior to chemotherapy. He reports feel well Tolerated chemotherapy, no nausea vomiting, diarrhea. Denies numbness tingling. No new complaints.     MEDICAL HISTORY:  Past Medical History:  Diagnosis Date   Acute on chronic respiratory failure with hypoxia (HCC)    Asthma    Coagulation disorder (HCC)    COPD (chronic obstructive pulmonary disease) (HCC)    Depression     History of hiatal hernia    Hypertension    Hypokalemia    Leukocytosis    Lung mass    Pneumonia    Schizophrenia (HCC)    Shortness of breath dyspnea    Stroke [facility])    Umbilical hernia    Ventral hernia     SURGICAL HISTORY: Past Surgical History:  Procedure Laterality Date   COLONOSCOPY WITH PROPOFOL N/A 09/21/2021   Procedure: COLONOSCOPY WITH PROPOFOL;  Surgeon: Regis Bill, MD;  Location: [facility] END[facility];  Service: Endoscopy;  Laterality: N/A;   HERNIA REPAIR     INSERTION OF MESH N/A 12/18/2014   Procedure: INSERTION OF MESH;  Surgeon: Lattie Haw, MD;  Location: [facility] ORS;  Service: General;  Laterality: N/A;   SUPRA-UMBILICAL HERNIA  12/18/2014   Procedure: SUPRA-UMBILICAL HERNIA;  Surgeon: Lattie Haw, MD;  Location: [facility] ORS;  Service: General;;   UMBILICAL HERNIA REPAIR N/A 12/18/2014   Procedure: HERNIA REPAIR UMBILICAL ADULT;  Surgeon: Lattie Haw, MD;  Location: [facility] ORS;  Service: General;  Laterality: N/A;   VIDEO BRONCHOSCOPY WITH ENDOBRONCHIAL ULTRASOUND N/A 03/15/2023   Procedure: VIDEO BRONCHOSCOPY WITH ENDOBRONCHIAL ULTRASOUND;  Surgeon: Vida Rigger, MD;  Location: [facility] ORS;  Service: Thoracic;  Laterality: N/A;    SOCIAL HISTORY: Social History   Socioeconomic History   Marital status: Widowed    Spouse name: Not on file   Number of children: Not on file   Years of education: Not on file   Highest education level: Not on file  Occupational History   Not on file  Tobacco Use   Smoking status: Every Day    Current packs/day: 0.15    Average packs/day: 0.2 packs/day for 45.0 years (6.8 ttl pk-yrs)    Types: Cigarettes   Smokeless tobacco: Never  Vaping Use   Vaping status: Never Used  Substance and Sexual Activity   Alcohol use: Not Currently    Alcohol/week: 75.0 standard drinks of alcohol    Types: 75 Cans of beer per week   Drug use: Not Currently    Types: Marijuana, "Crack" cocaine    Comment: none in 15 yrs   Sexual  activity: Not Currently  Other Topics Concern   Not on file  Social History Narrative   Not on file   Social Determinants of Health   Financial Resource Strain: Not on file  Food Insecurity: No Food Insecurity (03/25/2023)   Hunger Vital Sign    Worried About Running Out of Food in the Last Year: Never true    Ran Out of Food in the Last Year: Never true  Transportation Needs: No Transportation Needs (03/25/2023)   PRAPARE - Administrator, Civil Service (Medical): No    Lack of Transportation (Non-Medical): No  Physical Activity: Not on file  Stress: Not on  file  Social Connections: Not on file  Intimate Partner Violence: Not At Risk (03/25/2023)   Humiliation, Afraid, Rape, and Kick questionnaire    Fear of Current or Ex-Partner: No    Emotionally Abused: No    Physically Abused: No    Sexually Abused: No    FAMILY HISTORY: Family History  Problem Relation Age of Onset   Asthma Mother    Hypertension Father     ALLERGIES:  has No Known Allergies.  MEDICATIONS:  Current Outpatient Medications  Medication Sig Dispense Refill   albuterol (VENTOLIN HFA) 108 (90 Base) MCG/ACT inhaler Inhale 2 puffs into the lungs every 4 (four) hours as needed for wheezing or shortness of breath.     aspirin EC 81 MG tablet Take 81 mg by mouth daily. Swallow whole.     clonazePAM (KLONOPIN) 1 MG tablet Take 1 tablet (1 mg total) by mouth at bedtime. 30 tablet 0   clopidogrel (PLAVIX) 75 MG tablet Take 1 tablet (75 mg total) by mouth daily. 30 tablet 3   diltiazem (CARDIZEM CD) 240 MG 24 hr capsule Take 1 capsule (240 mg total) by mouth daily. 30 capsule 3   docusate sodium (COLACE) 100 MG capsule Take 100 mg by mouth 2 (two) times daily as needed for mild constipation.     famotidine (PEPCID) 20 MG tablet Take 1 tablet (20 mg total) by mouth daily. 30 tablet 0   Fluticasone-Umeclidin-Vilant (TRELEGY ELLIPTA) 100-62.5-25 MCG/ACT AEPB Inhale 1 puff into the lungs daily. 28 each 1    guaiFENesin (MUCINEX) 600 MG 12 hr tablet Take 600 mg by mouth 2 (two) times daily as needed.     ipratropium-albuterol (DUONEB) 0.5-2.5 (3) MG/3ML SOLN Take 3 mLs by nebulization every 6 (six) hours as needed (every 4 to 6 hours as needed for shortnes of breath or wheeziing).      ketoconazole (NIZORAL) 2 % cream Apply to both feet and between toes once daily for 6 weeks. 60 g 1   lovastatin (MEVACOR) 20 MG tablet Take 20 mg by mouth at bedtime.     mirtazapine (REMERON) 30 MG tablet Take 1 tablet (30 mg total) by mouth at bedtime. 30 tablet 3   montelukast (SINGULAIR) 10 MG tablet Take 1 tablet (10 mg total) by mouth at bedtime. 30 tablet 1   Multiple Vitamins-Minerals (MULTIVITAMIN WITH MINERALS) tablet Take 1 tablet by mouth daily.     Omega-3 Fatty Acids (FISH OIL) 1000 MG CPDR Take 1,000 mg by mouth at bedtime.     paliperidone (INVEGA) 9 MG 24 hr tablet Take 1 tablet (9 mg total) by mouth at bedtime. 30 tablet 3   potassium chloride (KLOR-CON M) 10 MEQ tablet Take 1 tablet (10 mEq total) by mouth daily. 30 tablet 0   prochlorperazine (COMPAZINE) 10 MG tablet Take 1 tablet (10 mg total) by mouth every 6 (six) hours as needed for nausea or vomiting. 90 tablet 0   Pyridoxine HCl (B-6) 100 MG TABS Take 1 tablet by mouth daily.     traZODone (DESYREL) 150 MG tablet Take 1 tablet (150 mg total) by mouth at bedtime as needed for sleep. 30 tablet 3   VITAMIN D, ERGOCALCIFEROL, PO Take 1,000 Units by mouth daily.      dextromethorphan-guaiFENesin (ROBITUSSIN-DM) 10-100 MG/5ML liquid Take 10 mLs by mouth every 6 (six) hours as needed for cough. (Patient not taking: Reported on 03/07/2023)     No current facility-administered medications for this visit.   Facility-Administered Medications  Ordered in Other Visits  Medication Dose Route Frequency Provider Last Rate Last Admin   CARBOplatin (PARAPLATIN) 300 mg in sodium chloride 0.9 % 100 mL chemo infusion  300 mg Intravenous Once [name], MD        PACLitaxel (TAXOL) 102 mg in sodium chloride 0.9 % 250 mL chemo infusion (</= 80mg /m2)  45 mg/m2 (Treatment Plan Recorded) Intravenous Once [name], MD 267 mL/hr at 04/27/23 1149 102 mg at 04/27/23 1149    Review of Systems  Constitutional:  Negative for appetite change, chills, fatigue, fever and unexpected weight change.  HENT:   Negative for hearing loss and voice change.   Eyes:  Negative for eye problems and icterus.  Respiratory:  Positive for cough and shortness of breath. Negative for chest tightness and hemoptysis.   Cardiovascular:  Negative for chest pain and leg swelling.  Gastrointestinal:  Negative for abdominal distention and abdominal pain.  Endocrine: Negative for hot flashes.  Genitourinary:  Negative for difficulty urinating, dysuria and frequency.   Musculoskeletal:  Negative for arthralgias.  Skin:  Negative for itching and rash.  Neurological:  Negative for light-headedness and numbness.  Hematological:  Negative for adenopathy. Does not bruise/bleed easily.  Psychiatric/Behavioral:  Negative for confusion.      PHYSICAL EXAMINATION: ECOG PERFORMANCE STATUS: 0 - Asymptomatic  Vitals:   04/27/23 0934  BP: (!) 132/94  Pulse: 99  Resp: 18  Temp: (!) 96 F (35.6 C)  SpO2: 96%   Filed Weights   04/27/23 0934  Weight: 236 lb (107 kg)    Physical Exam Constitutional:      General: He is not in acute distress.    Appearance: He is not diaphoretic.  HENT:     Head: Normocephalic and atraumatic.  Eyes:     General: No scleral icterus. Cardiovascular:     Rate and Rhythm: Normal rate and regular rhythm.  Pulmonary:     Effort: Pulmonary effort is normal. No respiratory distress.     Comments: Decreased breath sound bilaterally. Abdominal:     General: There is no distension.     Palpations: Abdomen is soft.     Tenderness: There is no abdominal tenderness.  Musculoskeletal:        General: Normal range of motion.     Cervical back: Normal range of  motion and neck supple.  Skin:    General: Skin is warm and dry.     Findings: No erythema.  Neurological:     Mental Status: He is alert and oriented to person, place, and time. Mental status is at baseline.     Cranial Nerves: No cranial nerve deficit.     Motor: No abnormal muscle tone.     Coordination: Coordination normal.  Psychiatric:        Mood and Affect: Affect normal.     Comments: Flat      LABORATORY DATA:  I have reviewed the data as listed    Latest Ref Rng & Units 04/27/2023    9:23 AM 04/20/2023    8:22 AM 03/24/2023    8:32 AM  CBC  WBC 4.0 - 10.5 K/uL 7.4  6.8  7.8   Hemoglobin 13.0 - 17.0 g/dL 16.1  09.6  04.5   Hematocrit 39.0 - 52.0 % 35.8  36.3  39.4   Platelets 150 - 400 K/uL 204  208  187       Latest Ref Rng & Units 04/27/2023    9:22 AM 04/20/2023  8:21 AM 03/24/2023    8:32 AM  CMP  Glucose 70 - 99 mg/dL 96  161  096   BUN 8 - 23 mg/dL 10  7  9    Creatinine 0.61 - 1.24 mg/dL 0.45  4.09  8.11   Sodium 135 - 145 mmol/L 137  137  138   Potassium 3.5 - 5.1 mmol/L 4.0  3.3  3.4   Chloride 98 - 111 mmol/L 104  106  105   CO2 22 - 32 mmol/L 25  23  25    Calcium 8.9 - 10.3 mg/dL 9.6  9.2  9.2   Total Protein 6.5 - 8.1 g/dL 7.1  6.9  7.0   Total Bilirubin 0.3 - 1.2 mg/dL 0.4  0.6  0.7   Alkaline Phos 38 - 126 U/L 62  66  70   AST 15 - 41 U/L 21  26  18    ALT 0 - 44 U/L 19  23  21       RADIOGRAPHIC STUDIES: I have personally reviewed the radiological images as listed and agreed with the findings in the report. No results found.

## 2023-04-27 NOTE — Assessment & Plan Note (Addendum)
Clinically he has Stage III left lung cancer CT scan and PET scan results were reviewed and discussed with patient. He has right paratracheal lymph node hypermetabolic activity Bronchoscopy showed Left upper lobe adenocarcinoma, Station 7 lymph node showed atypical cell No enough tissue for NGS, will send off liquid biopsy- send off liquid biopsy.  Labs are reviewed and discussed with patient. Proceed with carboplatin AUC 2 and taxol 45mg /m2 today.

## 2023-04-27 NOTE — Assessment & Plan Note (Signed)
Refractory to colace and senna.  Recommend Lactulose daily PRN.

## 2023-04-27 NOTE — Patient Instructions (Signed)
[place] [facility] AT [facility]  Discharge Instructions: Thank you for choosing [facility] to provide your oncology and hematology care.  If you have a lab appointment with the Cancer Center, please go directly to the Cancer Center and check in at the registration area.  Wear comfortable clothing and clothing appropriate for easy access to any Portacath or PICC line.   We strive to give you quality time with your provider. You may need to reschedule your appointment if you arrive late (15 or more minutes).  Arriving late affects you and other patients whose appointments are after yours.  Also, if you miss three or more appointments without notifying the office, you may be dismissed from the clinic at the provider's discretion.      For prescription refill requests, have your pharmacy contact our office and allow 72 hours for refills to be completed.    Today you received the following chemotherapy and/or immunotherapy agents taxol and carboplatin      To help prevent nausea and vomiting after your treatment, we encourage you to take your nausea medication as directed.  BELOW ARE SYMPTOMS THAT SHOULD BE REPORTED IMMEDIATELY: *FEVER GREATER THAN 100.4 F (38 C) OR HIGHER *CHILLS OR SWEATING *NAUSEA AND VOMITING THAT IS NOT CONTROLLED WITH YOUR NAUSEA MEDICATION *UNUSUAL SHORTNESS OF BREATH *UNUSUAL BRUISING OR BLEEDING *URINARY PROBLEMS (pain or burning when urinating, or frequent urination) *BOWEL PROBLEMS (unusual diarrhea, constipation, pain near the anus) TENDERNESS IN MOUTH AND THROAT WITH OR WITHOUT PRESENCE OF ULCERS (sore throat, sores in mouth, or a toothache) UNUSUAL RASH, SWELLING OR PAIN  UNUSUAL VAGINAL DISCHARGE OR ITCHING   Items with * indicate a potential emergency and should be followed up as soon as possible or go to the Emergency Department if any problems should occur.  Please show the CHEMOTHERAPY ALERT CARD or IMMUNOTHERAPY ALERT CARD at  check-in to the Emergency Department and triage nurse.  Should you have questions after your visit or need to cancel or reschedule your appointment, please contact [place] [facility]  [phone] and follow the prompts.  Office hours are 8:00 a.m. to 4:30 p.m. Monday - Friday. Please note that voicemails left after 4:00 p.m. may not be returned until the following business day.  We are closed weekends and major holidays. You have access to a nurse at all times for urgent questions. Please call the main number to the clinic [phone] and follow the prompts.  For any non-urgent questions, you may also contact your provider using MyChart. We now offer e-Visits for anyone 92 and older to request care online for non-urgent symptoms. For details visit [email or web address].   Also download the MyChart app! Go to the app store, search "MyChart", open the app, select [place], and log in with your MyChart username and password.

## 2023-04-27 NOTE — Assessment & Plan Note (Signed)
Chemotherapy plan as listed above 

## 2023-04-27 NOTE — Addendum Note (Signed)
Addended by: [name] on: 04/27/2023 01:18 PM   Modules accepted: Orders

## 2023-04-27 NOTE — Assessment & Plan Note (Signed)
Recommend patient to start potassium chloride daily.

## 2023-04-28 ENCOUNTER — Ambulatory Visit: Payer: 59

## 2023-04-28 ENCOUNTER — Ambulatory Visit
Admission: RE | Admit: 2023-04-28 | Discharge: 2023-04-28 | Disposition: A | Discharge status: Home / Self Care | Payer: 59 | Source: Ambulatory Visit | Attending: Radiation Oncology | Admitting: Radiation Oncology

## 2023-04-28 ENCOUNTER — Encounter: Payer: Self-pay | Admitting: Oncology

## 2023-04-28 ENCOUNTER — Other Ambulatory Visit: Payer: Self-pay

## 2023-04-28 DIAGNOSIS — Z5111 Encounter for antineoplastic chemotherapy: Secondary | ICD-10-CM | POA: Diagnosis not present

## 2023-04-28 LAB — RAD ONC ARIA SESSION SUMMARY
Course Elapsed Days: 4
Plan Fractions Treated to Date: 5
Plan Prescribed Dose Per Fraction: 2 Gy
Plan Total Fractions Prescribed: 35
Plan Total Prescribed Dose: 70 Gy
Reference Point Dosage Given to Date: 10 Gy
Reference Point Session Dosage Given: 2 Gy
Session Number: 5

## 2023-04-28 NOTE — Telephone Encounter (Signed)
Testing results send to scan.

## 2023-05-01 ENCOUNTER — Other Ambulatory Visit: Payer: Self-pay

## 2023-05-01 ENCOUNTER — Ambulatory Visit: Payer: 59

## 2023-05-01 ENCOUNTER — Ambulatory Visit
Admission: RE | Admit: 2023-05-01 | Discharge: 2023-05-01 | Disposition: A | Discharge status: Home / Self Care | Payer: 59 | Source: Ambulatory Visit | Attending: Radiation Oncology | Admitting: Radiation Oncology

## 2023-05-01 DIAGNOSIS — Z5111 Encounter for antineoplastic chemotherapy: Secondary | ICD-10-CM | POA: Diagnosis not present

## 2023-05-01 LAB — RAD ONC ARIA SESSION SUMMARY
Course Elapsed Days: 7
Plan Fractions Treated to Date: 6
Plan Prescribed Dose Per Fraction: 2 Gy
Plan Total Fractions Prescribed: 35
Plan Total Prescribed Dose: 70 Gy
Reference Point Dosage Given to Date: 12 Gy
Reference Point Session Dosage Given: 2 Gy
Session Number: 6

## 2023-05-02 ENCOUNTER — Other Ambulatory Visit: Payer: Self-pay

## 2023-05-02 ENCOUNTER — Ambulatory Visit
Admission: RE | Admit: 2023-05-02 | Discharge: 2023-05-02 | Disposition: A | Discharge status: Home / Self Care | Payer: 59 | Source: Ambulatory Visit | Attending: Radiation Oncology | Admitting: Radiation Oncology

## 2023-05-02 ENCOUNTER — Ambulatory Visit: Payer: 59

## 2023-05-02 DIAGNOSIS — Z5111 Encounter for antineoplastic chemotherapy: Secondary | ICD-10-CM | POA: Diagnosis not present

## 2023-05-02 LAB — RAD ONC ARIA SESSION SUMMARY
Course Elapsed Days: 8
Plan Fractions Treated to Date: 7
Plan Prescribed Dose Per Fraction: 2 Gy
Plan Total Fractions Prescribed: 35
Plan Total Prescribed Dose: 70 Gy
Reference Point Dosage Given to Date: 14 Gy
Reference Point Session Dosage Given: 2 Gy
Session Number: 7

## 2023-05-03 ENCOUNTER — Ambulatory Visit: Payer: 59

## 2023-05-03 ENCOUNTER — Ambulatory Visit
Admission: RE | Admit: 2023-05-03 | Discharge: 2023-05-03 | Disposition: A | Discharge status: Home / Self Care | Payer: 59 | Source: Ambulatory Visit | Attending: Radiation Oncology | Admitting: Radiation Oncology

## 2023-05-03 ENCOUNTER — Other Ambulatory Visit: Payer: Self-pay

## 2023-05-03 DIAGNOSIS — Z5111 Encounter for antineoplastic chemotherapy: Secondary | ICD-10-CM | POA: Diagnosis not present

## 2023-05-03 LAB — RAD ONC ARIA SESSION SUMMARY
Course Elapsed Days: 9
Plan Fractions Treated to Date: 8
Plan Prescribed Dose Per Fraction: 2 Gy
Plan Total Fractions Prescribed: 35
Plan Total Prescribed Dose: 70 Gy
Reference Point Dosage Given to Date: 16 Gy
Reference Point Session Dosage Given: 2 Gy
Session Number: 8

## 2023-05-03 MED FILL — Dexamethasone Sodium Phosphate Inj 100 MG/10ML: INTRAMUSCULAR | Qty: 1 | Status: AC

## 2023-05-04 ENCOUNTER — Ambulatory Visit
Admission: RE | Admit: 2023-05-04 | Discharge: 2023-05-04 | Disposition: A | Discharge status: Home / Self Care | Payer: 59 | Source: Ambulatory Visit | Attending: Radiation Oncology | Admitting: Radiation Oncology

## 2023-05-04 ENCOUNTER — Other Ambulatory Visit: Payer: Self-pay

## 2023-05-04 ENCOUNTER — Inpatient Hospital Stay (HOSPITAL_BASED_OUTPATIENT_CLINIC_OR_DEPARTMENT_OTHER): Payer: 59 | Admitting: Oncology

## 2023-05-04 ENCOUNTER — Inpatient Hospital Stay: Payer: 59

## 2023-05-04 ENCOUNTER — Encounter: Payer: Self-pay | Admitting: Oncology

## 2023-05-04 ENCOUNTER — Ambulatory Visit: Payer: 59

## 2023-05-04 VITALS — BP 122/85 | HR 104 | Temp 96.0°F | Resp 18 | Wt 231.2 lb

## 2023-05-04 VITALS — BP 145/89 | HR 82

## 2023-05-04 DIAGNOSIS — C3492 Malignant neoplasm of unspecified part of left bronchus or lung: Secondary | ICD-10-CM

## 2023-05-04 DIAGNOSIS — Z5111 Encounter for antineoplastic chemotherapy: Secondary | ICD-10-CM

## 2023-05-04 DIAGNOSIS — E876 Hypokalemia: Secondary | ICD-10-CM | POA: Diagnosis not present

## 2023-05-04 DIAGNOSIS — Z720 Tobacco use: Secondary | ICD-10-CM

## 2023-05-04 LAB — RAD ONC ARIA SESSION SUMMARY
Course Elapsed Days: 10
Plan Fractions Treated to Date: 9
Plan Prescribed Dose Per Fraction: 2 Gy
Plan Total Fractions Prescribed: 35
Plan Total Prescribed Dose: 70 Gy
Reference Point Dosage Given to Date: 18 Gy
Reference Point Session Dosage Given: 2 Gy
Session Number: 9

## 2023-05-04 LAB — CBC WITH DIFFERENTIAL (CANCER CENTER ONLY)
Abs Immature Granulocytes: 0.06 10*3/uL (ref 0.00–0.07)
Basophils Absolute: 0 10*3/uL (ref 0.0–0.1)
Basophils Relative: 1 %
Eosinophils Absolute: 0.1 10*3/uL (ref 0.0–0.5)
Eosinophils Relative: 1 %
HCT: 35.6 % — ABNORMAL LOW (ref 39.0–52.0)
Hemoglobin: 11.7 g/dL — ABNORMAL LOW (ref 13.0–17.0)
Immature Granulocytes: 1 %
Lymphocytes Relative: 31 %
Lymphs Abs: 1.9 10*3/uL (ref 0.7–4.0)
MCH: 30.1 pg (ref 26.0–34.0)
MCHC: 32.9 g/dL (ref 30.0–36.0)
MCV: 91.5 fL (ref 80.0–100.0)
Monocytes Absolute: 0.3 10*3/uL (ref 0.1–1.0)
Monocytes Relative: 4 %
Neutro Abs: 3.7 10*3/uL (ref 1.7–7.7)
Neutrophils Relative %: 62 %
Platelet Count: 190 10*3/uL (ref 150–400)
RBC: 3.89 MIL/uL — ABNORMAL LOW (ref 4.22–5.81)
RDW: 14.3 % (ref 11.5–15.5)
WBC Count: 6 10*3/uL (ref 4.0–10.5)
nRBC: 0 % (ref 0.0–0.2)

## 2023-05-04 LAB — CMP (CANCER CENTER ONLY)
ALT: 22 U/L (ref 0–44)
AST: 25 U/L (ref 15–41)
Albumin: 3.8 g/dL (ref 3.5–5.0)
Alkaline Phosphatase: 64 U/L (ref 38–126)
Anion gap: 9 (ref 5–15)
BUN: 9 mg/dL (ref 8–23)
CO2: 22 mmol/L (ref 22–32)
Calcium: 9.1 mg/dL (ref 8.9–10.3)
Chloride: 106 mmol/L (ref 98–111)
Creatinine: 0.65 mg/dL (ref 0.61–1.24)
GFR, Estimated: >60 mL/min (ref >60)
Glucose, Bld: 137 mg/dL — ABNORMAL HIGH (ref 70–99)
Potassium: 3.7 mmol/L (ref 3.5–5.1)
Sodium: 137 mmol/L (ref 135–145)
Total Bilirubin: 0.5 mg/dL (ref 0.3–1.2)
Total Protein: 6.8 g/dL (ref 6.5–8.1)

## 2023-05-04 MED ORDER — SODIUM CHLORIDE 0.9 % IV SOLN
10.0000 mg | Freq: Once | INTRAVENOUS | Status: AC
  Administered 2023-05-04: 10 mg via INTRAVENOUS
  Filled 2023-05-04: qty 10

## 2023-05-04 MED ORDER — SODIUM CHLORIDE 0.9 % IV SOLN
Freq: Once | INTRAVENOUS | Status: AC
  Filled 2023-05-04: qty 250

## 2023-05-04 MED ORDER — SODIUM CHLORIDE 0.9 % IV SOLN
300.0000 mg | Freq: Once | INTRAVENOUS | Status: AC
  Administered 2023-05-04: 300 mg via INTRAVENOUS
  Filled 2023-05-04: qty 30

## 2023-05-04 MED ORDER — PALONOSETRON HCL INJECTION 0.25 MG/5ML
0.2500 mg | Freq: Once | INTRAVENOUS | Status: AC
  Administered 2023-05-04: 0.25 mg via INTRAVENOUS
  Filled 2023-05-04: qty 5

## 2023-05-04 MED ORDER — FAMOTIDINE IN NACL 20-0.9 MG/50ML-% IV SOLN
20.0000 mg | Freq: Once | INTRAVENOUS | Status: AC
  Administered 2023-05-04: 20 mg via INTRAVENOUS
  Filled 2023-05-04: qty 50

## 2023-05-04 MED ORDER — SODIUM CHLORIDE 0.9 % IV SOLN
45.0000 mg/m2 | Freq: Once | INTRAVENOUS | Status: AC
  Administered 2023-05-04: 102 mg via INTRAVENOUS
  Filled 2023-05-04: qty 17

## 2023-05-04 MED ORDER — DIPHENHYDRAMINE HCL 50 MG/ML IJ SOLN
50.0000 mg | Freq: Once | INTRAMUSCULAR | Status: AC
  Administered 2023-05-04: 50 mg via INTRAVENOUS
  Filled 2023-05-04: qty 1

## 2023-05-04 NOTE — Assessment & Plan Note (Addendum)
Clinically he has Stage III left lung cancer CT scan and PET scan results were reviewed and discussed with patient. He has right paratracheal lymph node hypermetabolic activity Bronchoscopy showed Left upper lobe adenocarcinoma, Station 7 lymph node showed atypical cell No enough tissue for NGS, will send off liquid biopsy- send off liquid biopsy.  Labs are reviewed and discussed with patient. Proceed with carboplatin AUC 2 and taxol 45mg /m2 today.

## 2023-05-04 NOTE — Assessment & Plan Note (Signed)
Chemotherapy plan as listed above 

## 2023-05-04 NOTE — Patient Instructions (Signed)
[place] [facility]  Discharge Instructions: Thank you for choosing [facility] to provide your oncology and hematology care.  If you have a lab appointment with the Cancer Center, please go directly to the Cancer Center and check in at the registration area.  Wear comfortable clothing and clothing appropriate for easy access to any Portacath or PICC line.   We strive to give you quality time with your provider. You may need to reschedule your appointment if you arrive late (15 or more minutes).  Arriving late affects you and other patients whose appointments are after yours.  Also, if you miss three or more appointments without notifying the office, you may be dismissed from the clinic at the provider's discretion.      For prescription refill requests, have your pharmacy contact our office and allow 72 hours for refills to be completed.     To help prevent nausea and vomiting after your treatment, we encourage you to take your nausea medication as directed.  BELOW ARE SYMPTOMS THAT SHOULD BE REPORTED IMMEDIATELY: *FEVER GREATER THAN 100.4 F (38 C) OR HIGHER *CHILLS OR SWEATING *NAUSEA AND VOMITING THAT IS NOT CONTROLLED WITH YOUR NAUSEA MEDICATION *UNUSUAL SHORTNESS OF BREATH *UNUSUAL BRUISING OR BLEEDING *URINARY PROBLEMS (pain or burning when urinating, or frequent urination) *BOWEL PROBLEMS (unusual diarrhea, constipation, pain near the anus) TENDERNESS IN MOUTH AND THROAT WITH OR WITHOUT PRESENCE OF ULCERS (sore throat, sores in mouth, or a toothache) UNUSUAL RASH, SWELLING OR PAIN  UNUSUAL VAGINAL DISCHARGE OR ITCHING   Items with * indicate a potential emergency and should be followed up as soon as possible or go to the Emergency Department if any problems should occur.  Please show the CHEMOTHERAPY ALERT CARD or IMMUNOTHERAPY ALERT CARD at check-in to the Emergency Department and triage nurse.  Should you have questions after your visit  or need to cancel or reschedule your appointment, please contact [place] [facility]  [phone] and follow the prompts.  Office hours are 8:00 a.m. to 4:30 p.m. Monday - Friday. Please note that voicemails left after 4:00 p.m. may not be returned until the following business day.  We are closed weekends and major holidays. You have access to a nurse at all times for urgent questions. Please call the main number to the clinic [phone] and follow the prompts.  For any non-urgent questions, you may also contact your provider using MyChart. We now offer e-Visits for anyone 76 and older to request care online for non-urgent symptoms. For details visit [email or web address].   Also download the MyChart app! Go to the app store, search "MyChart", open the app, select [place], and log in with your MyChart username and password.

## 2023-05-04 NOTE — Assessment & Plan Note (Signed)
Encourage his cessation effort. - stopped in early Sept.

## 2023-05-04 NOTE — Assessment & Plan Note (Signed)
potassium chloride daily

## 2023-05-04 NOTE — Progress Notes (Signed)
Hematology/Oncology Progress note [phone] [phone]        REFERRING PROVIDER: [name], MD    CHIEF COMPLAINTS/PURPOSE OF CONSULTATION:  Stage III Lung cancer  ASSESSMENT & PLAN:   Cancer Staging  Adenocarcinoma of left lung [facility]) Staging form: Lung, AJCC 8th Edition - Clinical stage from 03/22/2023: cT1, cN3, cM0 - Signed by [name], MD on 03/22/2023   Adenocarcinoma of left lung [facility]) Clinically he has Stage III left lung cancer CT scan and PET scan results were reviewed and discussed with patient. He has right paratracheal lymph node hypermetabolic activity Bronchoscopy showed Left upper lobe adenocarcinoma, Station 7 lymph node showed atypical cell No enough tissue for NGS, will send off liquid biopsy- send off liquid biopsy.  Labs are reviewed and discussed with patient. Proceed with carboplatin AUC 2 and taxol 45mg /m2 today.   Hypokalemia  potassium chloride daily.  Tobacco use Encourage his cessation effort. - stopped in early Sept.   Encounter for antineoplastic chemotherapy Chemotherapy plan as listed above.    No orders of the defined types were placed in this encounter.  Follow up 1 week lab MD chemo  All questions were answered. The patient knows to call the clinic with any problems, questions or concerns.  [name], MD, PhD [facility] Hematology Oncology 05/04/2023    HISTORY OF PRESENTING ILLNESS:  [name] 62 y.o. male presents to establish care for lung mass I have reviewed his chart and materials related to his cancer extensively and collaborated history with the patient. Summary of oncologic history is as follows:  12/08/2021 CT chest with contrast showed irregular solid pulmonary nodule of right upper lobe, increased in size.  Bibasilar consolidation.  Mildly enlarged right lower paratracheal lymph node.  Atherosclerosis. 01/04/2022 PET scan showed 10 mm irregular nodule in the lateral right upper lobe,  suspicious for primary bronchogenic neoplasm. 2 small right paratracheal nodes, indeterminate and favored to be reactive.  Attention on follow-up.  Patient was seen by radiation oncology Dr. Rushie Chestnut.  Patient was offered SBRT to the right upper lobe nodule/presumed non-small cell lung cancer stage I.  Patient lives in a group home.  He reports chronic cough.  Some shortness of breath with exertion. He is a current everyday smoker.  Few cigarettes per day.  He denies any unintentional weight loss, night sweats, fever or chills. Patient is on aspirin and Plavix   INTERVAL HISTORY [name] is a 62 y.o. male who has above history reviewed by me today presents for follow up visit for lung cancer.  Oncology History  Adenocarcinoma of left lung (HCC)  12/29/2022 Imaging   CT chest with contrast showed Stable radiation changes involving the right upper lobe with a small residual treated nodule.  Decrease in size.  Interval enlargement of the left upper lobe pulmonary nodule.  Stable mediastinal nodes stable advanced emphysematous changes and areas of pulmonary scarring.  Stable bilateral adrenal gland adenomas.  Emphysema   01/30/2023 Imaging   PET 1. Recurrent bronchogenic carcinoma in the left upper lobe with new and increasingly hypermetabolic mediastinal lymph nodes. No evidence of distant metastatic disease. 2. Possible mild prostate hypermetabolism. Consider laboratory correlation. 3. Liver is mildly heterogeneous, raising suspicion for steatosis. 4. Cholelithiasis. 5. Right adrenal adenoma.  Left adrenal myelolipoma. 6. Aortic atherosclerosis (ICD10-I70.0). Coronary artery calcification. 7. Enlarged pulmonic trunk, indicative of pulmonary arterial hypertension.   03/15/2023 Initial Diagnosis   Adenocarcinoma of left lung   Bronchoscopy showed  Lung, biopsy,  Left upper lobe - NON-SMALL CELL CARCINOMA, FAVOR ADENOCARCINOMA.  1. Bronchial Lavage, Left upper lobe - POSITIVE FOR  MALIGNANCY. - NON-SMALL CELL CARCINOMA, FAVOR ADENOCARCINOMA. - SEE NOTE. 2. Bronchial Brushing, Left upper lobe - POSITIVE FOR MALIGNANCY. - NON-SMALL CELL CARCINOMA, FAVOR ADENOCARCINOMA. 3. Bronchus, biopsy, left upper lobe - SEE [id number] 4. Fine Needle Aspiration, left upper lobe - POSITIVE FOR MALIGNANCY. - NON-SMALL CELL CARCINOMA, FAVOR ADENOCARCINOMA. 5. Fine Needle Aspiration, station 7 lymph node - ATYPICAL. - BACKGROUND LYMPHOID TISSUE IS PRESENT CONSISTENT WITH LYMPH NODE SAMPLING.    03/15/2023 Procedure   Fiberoptic bronchoscopy with airway inspection and BAL Procedure findings:   Bronchoscope was inserted via ETT  without difficulty.  Posterior oropharynx, epiglottis, arytenoids, false cords and vocal cords were not visualized as these were bypassed by endotracheal tube. The distal trachea was normal in circumference and appearance without mucosal, cartilaginous or branching abnormalities.  The main carina was mildly splayed . All right and left lobar airways were visualized to the Subsegmental level.  Sub- sub segmental carinae were identified in all the distal airways.   Secretions were visible in the following airways and appeared to be clear.  The mucosa was : friable at LEFT UPPER LOBE   Airways were notable for:        exophytic lesions :n       extrinsic compression in the following distributions: n.       Friable mucosa: y       Teacher, music /pigmentation: n   MUCUS PLUGGING WAS SEVERE ON LEFT SIDE AND COMPLETE OBSTRUCTED MULTIPLE AIRWAYS.  UNABLE TO NAVIGATE THROUGH TRACHEOBRONCHIAL TREE DUE TO SEVERE MUCUS PLUGGING. THERAPEUTIC ASPIRATION WAS PERFORMED X 7 AND WAS DONE BILATERALLY ALTHOUGH WORSE ON LEFT.  BAL WAS PERFORMED AT LEFT UPPER LOBE X 2.     Endobronchial ultrasound assisted hilar and mediastinal lymph node biopsies procedure findings: The fiberoptic bronchoscope was removed and the EBUS scope was introduced. Examination began to  evaluate for pathologically enlarged lymph nodes starting on the RIGHT  side progressing to the LEFT side.  All lymph node biopsies performed with 21G  needle. Lymph node biopsies were sent in cytolite for all stations.   STATION 10R - 6mm not biopsied STATION 7 - 1.2cm biopsied 3 times STATION 10L - 5mm not biopsied  STATION 4L - 6mm not biopsied    03/22/2023 Cancer Staging   Staging form: Lung, AJCC 8th Edition - Clinical stage from 03/22/2023: cT1, cN3, cM0 - Signed by [name], MD on 03/22/2023 Stage prefix: Initial diagnosis   04/20/2023 -  Chemotherapy   Patient is on Treatment Plan : LUNG Carboplatin + Paclitaxel + XRT q7d        Patient presents for evaluation prior to chemotherapy. He reports feel well Tolerated chemotherapy, no nausea vomiting, diarrhea. Denies numbness tingling. No new complaints.     MEDICAL HISTORY:  Past Medical History:  Diagnosis Date   Acute on chronic respiratory failure with hypoxia (HCC)    Asthma    Coagulation disorder (HCC)    COPD (chronic obstructive pulmonary disease) (HCC)    Depression    History of hiatal hernia    Hypertension    Hypokalemia    Leukocytosis    Lung mass    Pneumonia    Schizophrenia (HCC)    Shortness of breath dyspnea    Stroke [facility])    Umbilical hernia    Ventral hernia     SURGICAL HISTORY: Past Surgical History:  Procedure  Laterality Date   COLONOSCOPY WITH PROPOFOL N/A 09/21/2021   Procedure: COLONOSCOPY WITH PROPOFOL;  Surgeon: Regis Bill, MD;  Location: [facility] ENDOSCOPY;  Service: Endoscopy;  Laterality: N/A;   HERNIA REPAIR     INSERTION OF MESH N/A 12/18/2014   Procedure: INSERTION OF MESH;  Surgeon: Lattie Haw, MD;  Location: [facility] ORS;  Service: General;  Laterality: N/A;   SUPRA-UMBILICAL HERNIA  12/18/2014   Procedure: SUPRA-UMBILICAL HERNIA;  Surgeon: Lattie Haw, MD;  Location: [facility] ORS;  Service: General;;   UMBILICAL HERNIA REPAIR N/A 12/18/2014   Procedure: HERNIA REPAIR  UMBILICAL ADULT;  Surgeon: Lattie Haw, MD;  Location: [facility] ORS;  Service: General;  Laterality: N/A;   VIDEO BRONCHOSCOPY WITH ENDOBRONCHIAL ULTRASOUND N/A 03/15/2023   Procedure: VIDEO BRONCHOSCOPY WITH ENDOBRONCHIAL ULTRASOUND;  Surgeon: Vida Rigger, MD;  Location: [facility] ORS;  Service: Thoracic;  Laterality: N/A;    SOCIAL HISTORY: Social History   Socioeconomic History   Marital status: Widowed    Spouse name: Not on file   Number of children: Not on file   Years of education: Not on file   Highest education level: Not on file  Occupational History   Not on file  Tobacco Use   Smoking status: Every Day    Current packs/day: 0.15    Average packs/day: 0.2 packs/day for 45.0 years (6.8 ttl pk-yrs)    Types: Cigarettes   Smokeless tobacco: Never  Vaping Use   Vaping status: Never Used  Substance and Sexual Activity   Alcohol use: Not Currently    Alcohol/week: 75.0 standard drinks of alcohol    Types: 75 Cans of beer per week   Drug use: Not Currently    Types: Marijuana, "Crack" cocaine    Comment: none in 15 yrs   Sexual activity: Not Currently  Other Topics Concern   Not on file  Social History Narrative   Not on file   Social Determinants of Health   Financial Resource Strain: Not on file  Food Insecurity: No Food Insecurity (03/25/2023)   Hunger Vital Sign    Worried About Running Out of Food in the Last Year: Never true    Ran Out of Food in the Last Year: Never true  Transportation Needs: No Transportation Needs (03/25/2023)   PRAPARE - Administrator, Civil Service (Medical): No    Lack of Transportation (Non-Medical): No  Physical Activity: Not on file  Stress: Not on file  Social Connections: Not on file  Intimate Partner Violence: Not At Risk (03/25/2023)   Humiliation, Afraid, Rape, and Kick questionnaire    Fear of Current or Ex-Partner: No    Emotionally Abused: No    Physically Abused: No    Sexually Abused: No    FAMILY  HISTORY: Family History  Problem Relation Age of Onset   Asthma Mother    Hypertension Father     ALLERGIES:  has No Known Allergies.  MEDICATIONS:  Current Outpatient Medications  Medication Sig Dispense Refill   albuterol (VENTOLIN HFA) 108 (90 Base) MCG/ACT inhaler Inhale 2 puffs into the lungs every 4 (four) hours as needed for wheezing or shortness of breath.     aspirin EC 81 MG tablet Take 81 mg by mouth daily. Swallow whole.     clonazePAM (KLONOPIN) 1 MG tablet Take 1 tablet (1 mg total) by mouth at bedtime. 30 tablet 0   clopidogrel (PLAVIX) 75 MG tablet Take 1 tablet (75 mg total) by mouth daily.  30 tablet 3   diltiazem (CARDIZEM CD) 240 MG 24 hr capsule Take 1 capsule (240 mg total) by mouth daily. 30 capsule 3   docusate sodium (COLACE) 100 MG capsule Take 100 mg by mouth 2 (two) times daily as needed for mild constipation.     famotidine (PEPCID) 20 MG tablet Take 1 tablet (20 mg total) by mouth daily. 30 tablet 0   Fluticasone-Umeclidin-Vilant (TRELEGY ELLIPTA) 100-62.5-25 MCG/ACT AEPB Inhale 1 puff into the lungs daily. 28 each 1   guaiFENesin (MUCINEX) 600 MG 12 hr tablet Take 600 mg by mouth 2 (two) times daily as needed.     ipratropium-albuterol (DUONEB) 0.5-2.5 (3) MG/3ML SOLN Take 3 mLs by nebulization every 6 (six) hours as needed (every 4 to 6 hours as needed for shortnes of breath or wheeziing).      ketoconazole (NIZORAL) 2 % cream Apply to both feet and between toes once daily for 6 weeks. 60 g 1   lactulose (CHRONULAC) 10 GM/15ML solution Take 15 mLs (10 g total) by mouth daily as needed for severe constipation. 236 mL 0   lovastatin (MEVACOR) 20 MG tablet Take 20 mg by mouth at bedtime.     mirtazapine (REMERON) 30 MG tablet Take 1 tablet (30 mg total) by mouth at bedtime. 30 tablet 3   montelukast (SINGULAIR) 10 MG tablet Take 1 tablet (10 mg total) by mouth at bedtime. 30 tablet 1   Multiple Vitamins-Minerals (MULTIVITAMIN WITH MINERALS) tablet Take 1 tablet  by mouth daily.     Omega-3 Fatty Acids (FISH OIL) 1000 MG CPDR Take 1,000 mg by mouth at bedtime.     paliperidone (INVEGA) 9 MG 24 hr tablet Take 1 tablet (9 mg total) by mouth at bedtime. 30 tablet 3   potassium chloride (KLOR-CON M) 10 MEQ tablet Take 1 tablet (10 mEq total) by mouth daily. 30 tablet 0   prochlorperazine (COMPAZINE) 10 MG tablet Take 1 tablet (10 mg total) by mouth every 6 (six) hours as needed for nausea or vomiting. 90 tablet 0   Pyridoxine HCl (B-6) 100 MG TABS Take 1 tablet by mouth daily.     traZODone (DESYREL) 150 MG tablet Take 1 tablet (150 mg total) by mouth at bedtime as needed for sleep. 30 tablet 3   VITAMIN D, ERGOCALCIFEROL, PO Take 1,000 Units by mouth daily.      dextromethorphan-guaiFENesin (ROBITUSSIN-DM) 10-100 MG/5ML liquid Take 10 mLs by mouth every 6 (six) hours as needed for cough. (Patient not taking: Reported on 03/07/2023)     No current facility-administered medications for this visit.   Facility-Administered Medications Ordered in Other Visits  Medication Dose Route Frequency Provider Last Rate Last Admin   CARBOplatin (PARAPLATIN) 300 mg in sodium chloride 0.9 % 100 mL chemo infusion  300 mg Intravenous Once [name], MD        Review of Systems  Constitutional:  Negative for appetite change, chills, fatigue, fever and unexpected weight change.  HENT:   Negative for hearing loss and voice change.   Eyes:  Negative for eye problems and icterus.  Respiratory:  Positive for cough and shortness of breath. Negative for chest tightness and hemoptysis.   Cardiovascular:  Negative for chest pain and leg swelling.  Gastrointestinal:  Negative for abdominal distention and abdominal pain.  Endocrine: Negative for hot flashes.  Genitourinary:  Negative for difficulty urinating, dysuria and frequency.   Musculoskeletal:  Negative for arthralgias.  Skin:  Negative for itching and rash.  Neurological:  Negative for light-headedness and numbness.   Hematological:  Negative for adenopathy. Does not bruise/bleed easily.  Psychiatric/Behavioral:  Negative for confusion.      PHYSICAL EXAMINATION: ECOG PERFORMANCE STATUS: 0 - Asymptomatic  Vitals:   05/04/23 0833 05/04/23 0842  BP: (!) 143/94 122/85  Pulse: (!) 104   Resp: 18   Temp: (!) 96 F (35.6 C)   SpO2: 96%    Filed Weights   05/04/23 0833  Weight: 231 lb 3.2 oz (104.9 kg)    Physical Exam Constitutional:      General: He is not in acute distress.    Appearance: He is not diaphoretic.  HENT:     Head: Normocephalic and atraumatic.  Eyes:     General: No scleral icterus. Cardiovascular:     Rate and Rhythm: Normal rate and regular rhythm.  Pulmonary:     Effort: Pulmonary effort is normal. No respiratory distress.     Comments: Decreased breath sound bilaterally. Abdominal:     General: There is no distension.     Palpations: Abdomen is soft.     Tenderness: There is no abdominal tenderness.  Musculoskeletal:        General: Normal range of motion.     Cervical back: Normal range of motion and neck supple.  Skin:    General: Skin is warm and dry.     Findings: No erythema.  Neurological:     Mental Status: He is alert and oriented to person, place, and time. Mental status is at baseline.     Motor: No abnormal muscle tone.  Psychiatric:        Mood and Affect: Affect normal.     Comments: Flat      LABORATORY DATA:  I have reviewed the data as listed    Latest Ref Rng & Units 05/04/2023    8:18 AM 04/27/2023    9:23 AM 04/20/2023    8:22 AM  CBC  WBC 4.0 - 10.5 K/uL 6.0  7.4  6.8   Hemoglobin 13.0 - 17.0 g/dL 16.1  09.6  04.5   Hematocrit 39.0 - 52.0 % 35.6  35.8  36.3   Platelets 150 - 400 K/uL 190  204  208       Latest Ref Rng & Units 05/04/2023    8:18 AM 04/27/2023    9:22 AM 04/20/2023    8:21 AM  CMP  Glucose 70 - 99 mg/dL 409  96  811   BUN 8 - 23 mg/dL 9  10  7    Creatinine 0.61 - 1.24 mg/dL 9.14  7.82  9.56   Sodium 135 - 145  mmol/L 137  137  137   Potassium 3.5 - 5.1 mmol/L 3.7  4.0  3.3   Chloride 98 - 111 mmol/L 106  104  106   CO2 22 - 32 mmol/L 22  25  23    Calcium 8.9 - 10.3 mg/dL 9.1  9.6  9.2   Total Protein 6.5 - 8.1 g/dL 6.8  7.1  6.9   Total Bilirubin 0.3 - 1.2 mg/dL 0.5  0.4  0.6   Alkaline Phos 38 - 126 U/L 64  62  66   AST 15 - 41 U/L 25  21  26    ALT 0 - 44 U/L 22  19  23       RADIOGRAPHIC STUDIES: I have personally reviewed the radiological images as listed and agreed with the findings in the report. No results found.

## 2023-05-05 ENCOUNTER — Ambulatory Visit
Admission: RE | Admit: 2023-05-05 | Discharge: 2023-05-05 | Disposition: A | Discharge status: Home / Self Care | Payer: 59 | Source: Ambulatory Visit | Attending: Radiation Oncology | Admitting: Radiation Oncology

## 2023-05-05 ENCOUNTER — Ambulatory Visit: Payer: 59

## 2023-05-05 ENCOUNTER — Other Ambulatory Visit: Payer: Self-pay

## 2023-05-05 DIAGNOSIS — Z5111 Encounter for antineoplastic chemotherapy: Secondary | ICD-10-CM | POA: Diagnosis not present

## 2023-05-05 LAB — RAD ONC ARIA SESSION SUMMARY
Course Elapsed Days: 11
Plan Fractions Treated to Date: 10
Plan Prescribed Dose Per Fraction: 2 Gy
Plan Total Fractions Prescribed: 35
Plan Total Prescribed Dose: 70 Gy
Reference Point Dosage Given to Date: 20 Gy
Reference Point Session Dosage Given: 2 Gy
Session Number: 10

## 2023-05-08 ENCOUNTER — Ambulatory Visit: Payer: 59

## 2023-05-08 ENCOUNTER — Other Ambulatory Visit: Payer: Self-pay

## 2023-05-08 ENCOUNTER — Ambulatory Visit
Admission: RE | Admit: 2023-05-08 | Discharge: 2023-05-08 | Disposition: A | Discharge status: Home / Self Care | Payer: 59 | Source: Ambulatory Visit | Attending: Radiation Oncology | Admitting: Radiation Oncology

## 2023-05-08 DIAGNOSIS — Z5111 Encounter for antineoplastic chemotherapy: Secondary | ICD-10-CM | POA: Diagnosis not present

## 2023-05-08 LAB — RAD ONC ARIA SESSION SUMMARY
Course Elapsed Days: 14
Plan Fractions Treated to Date: 11
Plan Prescribed Dose Per Fraction: 2 Gy
Plan Total Fractions Prescribed: 35
Plan Total Prescribed Dose: 70 Gy
Reference Point Dosage Given to Date: 22 Gy
Reference Point Session Dosage Given: 2 Gy
Session Number: 11

## 2023-05-09 ENCOUNTER — Ambulatory Visit: Payer: 59

## 2023-05-09 ENCOUNTER — Other Ambulatory Visit: Payer: Self-pay

## 2023-05-09 ENCOUNTER — Ambulatory Visit
Admission: RE | Admit: 2023-05-09 | Discharge: 2023-05-09 | Disposition: A | Discharge status: Home / Self Care | Payer: 59 | Source: Ambulatory Visit | Attending: Radiation Oncology | Admitting: Radiation Oncology

## 2023-05-09 DIAGNOSIS — R053 Chronic cough: Secondary | ICD-10-CM | POA: Insufficient documentation

## 2023-05-09 DIAGNOSIS — F209 Schizophrenia, unspecified: Secondary | ICD-10-CM | POA: Insufficient documentation

## 2023-05-09 DIAGNOSIS — D3502 Benign neoplasm of left adrenal gland: Secondary | ICD-10-CM | POA: Insufficient documentation

## 2023-05-09 DIAGNOSIS — J9621 Acute and chronic respiratory failure with hypoxia: Secondary | ICD-10-CM | POA: Diagnosis not present

## 2023-05-09 DIAGNOSIS — J449 Chronic obstructive pulmonary disease, unspecified: Secondary | ICD-10-CM | POA: Diagnosis not present

## 2023-05-09 DIAGNOSIS — D689 Coagulation defect, unspecified: Secondary | ICD-10-CM | POA: Diagnosis not present

## 2023-05-09 DIAGNOSIS — Z5111 Encounter for antineoplastic chemotherapy: Secondary | ICD-10-CM | POA: Diagnosis not present

## 2023-05-09 DIAGNOSIS — K8020 Calculus of gallbladder without cholecystitis without obstruction: Secondary | ICD-10-CM | POA: Diagnosis not present

## 2023-05-09 DIAGNOSIS — K5900 Constipation, unspecified: Secondary | ICD-10-CM | POA: Diagnosis not present

## 2023-05-09 DIAGNOSIS — K449 Diaphragmatic hernia without obstruction or gangrene: Secondary | ICD-10-CM | POA: Diagnosis not present

## 2023-05-09 DIAGNOSIS — Z510 Encounter for antineoplastic radiation therapy: Secondary | ICD-10-CM | POA: Insufficient documentation

## 2023-05-09 DIAGNOSIS — I2510 Atherosclerotic heart disease of native coronary artery without angina pectoris: Secondary | ICD-10-CM | POA: Insufficient documentation

## 2023-05-09 DIAGNOSIS — J45909 Unspecified asthma, uncomplicated: Secondary | ICD-10-CM | POA: Diagnosis not present

## 2023-05-09 DIAGNOSIS — I10 Essential (primary) hypertension: Secondary | ICD-10-CM | POA: Diagnosis not present

## 2023-05-09 DIAGNOSIS — Z87891 Personal history of nicotine dependence: Secondary | ICD-10-CM | POA: Diagnosis not present

## 2023-05-09 DIAGNOSIS — I700 Atherosclerosis of aorta: Secondary | ICD-10-CM | POA: Diagnosis not present

## 2023-05-09 DIAGNOSIS — Z7902 Long term (current) use of antithrombotics/antiplatelets: Secondary | ICD-10-CM | POA: Diagnosis not present

## 2023-05-09 DIAGNOSIS — C3412 Malignant neoplasm of upper lobe, left bronchus or lung: Secondary | ICD-10-CM | POA: Diagnosis present

## 2023-05-09 DIAGNOSIS — F17210 Nicotine dependence, cigarettes, uncomplicated: Secondary | ICD-10-CM | POA: Insufficient documentation

## 2023-05-09 DIAGNOSIS — Z79899 Other long term (current) drug therapy: Secondary | ICD-10-CM | POA: Insufficient documentation

## 2023-05-09 DIAGNOSIS — E876 Hypokalemia: Secondary | ICD-10-CM | POA: Diagnosis not present

## 2023-05-09 DIAGNOSIS — D3501 Benign neoplasm of right adrenal gland: Secondary | ICD-10-CM | POA: Insufficient documentation

## 2023-05-09 DIAGNOSIS — Z7982 Long term (current) use of aspirin: Secondary | ICD-10-CM | POA: Insufficient documentation

## 2023-05-09 DIAGNOSIS — Z8673 Personal history of transient ischemic attack (TIA), and cerebral infarction without residual deficits: Secondary | ICD-10-CM | POA: Insufficient documentation

## 2023-05-09 LAB — RAD ONC ARIA SESSION SUMMARY
Course Elapsed Days: 15
Plan Fractions Treated to Date: 12
Plan Prescribed Dose Per Fraction: 2 Gy
Plan Total Fractions Prescribed: 35
Plan Total Prescribed Dose: 70 Gy
Reference Point Dosage Given to Date: 24 Gy
Reference Point Session Dosage Given: 2 Gy
Session Number: 12

## 2023-05-10 ENCOUNTER — Ambulatory Visit
Admission: RE | Admit: 2023-05-10 | Discharge: 2023-05-10 | Disposition: A | Discharge status: Home / Self Care | Payer: 59 | Source: Ambulatory Visit | Attending: Radiation Oncology | Admitting: Radiation Oncology

## 2023-05-10 ENCOUNTER — Ambulatory Visit: Payer: 59

## 2023-05-10 ENCOUNTER — Other Ambulatory Visit: Payer: Self-pay

## 2023-05-10 DIAGNOSIS — Z510 Encounter for antineoplastic radiation therapy: Secondary | ICD-10-CM | POA: Diagnosis not present

## 2023-05-10 LAB — RAD ONC ARIA SESSION SUMMARY
Course Elapsed Days: 16
Plan Fractions Treated to Date: 13
Plan Prescribed Dose Per Fraction: 2 Gy
Plan Total Fractions Prescribed: 35
Plan Total Prescribed Dose: 70 Gy
Reference Point Dosage Given to Date: 26 Gy
Reference Point Session Dosage Given: 2 Gy
Session Number: 13

## 2023-05-10 MED FILL — Dexamethasone Sodium Phosphate Inj 100 MG/10ML: INTRAMUSCULAR | Qty: 1 | Status: AC

## 2023-05-11 ENCOUNTER — Ambulatory Visit
Admission: RE | Admit: 2023-05-11 | Discharge: 2023-05-11 | Disposition: A | Discharge status: Home / Self Care | Payer: 59 | Source: Ambulatory Visit | Attending: Radiation Oncology | Admitting: Radiation Oncology

## 2023-05-11 ENCOUNTER — Other Ambulatory Visit: Payer: Self-pay

## 2023-05-11 ENCOUNTER — Ambulatory Visit (INDEPENDENT_AMBULATORY_CARE_PROVIDER_SITE_OTHER): Payer: 59 | Admitting: Podiatry

## 2023-05-11 ENCOUNTER — Inpatient Hospital Stay: Payer: 59 | Admitting: Oncology

## 2023-05-11 ENCOUNTER — Encounter: Payer: Self-pay | Admitting: Podiatry

## 2023-05-11 ENCOUNTER — Encounter: Payer: Self-pay | Admitting: Oncology

## 2023-05-11 ENCOUNTER — Inpatient Hospital Stay: Payer: 59

## 2023-05-11 ENCOUNTER — Ambulatory Visit: Payer: 59

## 2023-05-11 VITALS — BP 122/87 | HR 94 | Temp 97.8°F | Resp 18 | Wt 230.0 lb

## 2023-05-11 VITALS — BP 136/86 | HR 86 | Resp 20

## 2023-05-11 DIAGNOSIS — M79674 Pain in right toe(s): Secondary | ICD-10-CM | POA: Diagnosis not present

## 2023-05-11 DIAGNOSIS — F201 Disorganized schizophrenia: Secondary | ICD-10-CM | POA: Diagnosis not present

## 2023-05-11 DIAGNOSIS — I10 Essential (primary) hypertension: Secondary | ICD-10-CM | POA: Insufficient documentation

## 2023-05-11 DIAGNOSIS — Z7902 Long term (current) use of antithrombotics/antiplatelets: Secondary | ICD-10-CM | POA: Insufficient documentation

## 2023-05-11 DIAGNOSIS — F209 Schizophrenia, unspecified: Secondary | ICD-10-CM | POA: Insufficient documentation

## 2023-05-11 DIAGNOSIS — Z5111 Encounter for antineoplastic chemotherapy: Secondary | ICD-10-CM

## 2023-05-11 DIAGNOSIS — D3502 Benign neoplasm of left adrenal gland: Secondary | ICD-10-CM | POA: Insufficient documentation

## 2023-05-11 DIAGNOSIS — I2510 Atherosclerotic heart disease of native coronary artery without angina pectoris: Secondary | ICD-10-CM | POA: Insufficient documentation

## 2023-05-11 DIAGNOSIS — E876 Hypokalemia: Secondary | ICD-10-CM | POA: Insufficient documentation

## 2023-05-11 DIAGNOSIS — Z79899 Other long term (current) drug therapy: Secondary | ICD-10-CM | POA: Insufficient documentation

## 2023-05-11 DIAGNOSIS — K449 Diaphragmatic hernia without obstruction or gangrene: Secondary | ICD-10-CM | POA: Insufficient documentation

## 2023-05-11 DIAGNOSIS — C3412 Malignant neoplasm of upper lobe, left bronchus or lung: Secondary | ICD-10-CM | POA: Insufficient documentation

## 2023-05-11 DIAGNOSIS — J449 Chronic obstructive pulmonary disease, unspecified: Secondary | ICD-10-CM | POA: Insufficient documentation

## 2023-05-11 DIAGNOSIS — R053 Chronic cough: Secondary | ICD-10-CM | POA: Insufficient documentation

## 2023-05-11 DIAGNOSIS — J45909 Unspecified asthma, uncomplicated: Secondary | ICD-10-CM | POA: Insufficient documentation

## 2023-05-11 DIAGNOSIS — Z720 Tobacco use: Secondary | ICD-10-CM | POA: Diagnosis not present

## 2023-05-11 DIAGNOSIS — Z87891 Personal history of nicotine dependence: Secondary | ICD-10-CM | POA: Insufficient documentation

## 2023-05-11 DIAGNOSIS — M79675 Pain in left toe(s): Secondary | ICD-10-CM

## 2023-05-11 DIAGNOSIS — I700 Atherosclerosis of aorta: Secondary | ICD-10-CM | POA: Insufficient documentation

## 2023-05-11 DIAGNOSIS — R0602 Shortness of breath: Secondary | ICD-10-CM | POA: Insufficient documentation

## 2023-05-11 DIAGNOSIS — K8020 Calculus of gallbladder without cholecystitis without obstruction: Secondary | ICD-10-CM | POA: Insufficient documentation

## 2023-05-11 DIAGNOSIS — C3492 Malignant neoplasm of unspecified part of left bronchus or lung: Secondary | ICD-10-CM

## 2023-05-11 DIAGNOSIS — J9621 Acute and chronic respiratory failure with hypoxia: Secondary | ICD-10-CM | POA: Insufficient documentation

## 2023-05-11 DIAGNOSIS — Z8673 Personal history of transient ischemic attack (TIA), and cerebral infarction without residual deficits: Secondary | ICD-10-CM | POA: Insufficient documentation

## 2023-05-11 DIAGNOSIS — B351 Tinea unguium: Secondary | ICD-10-CM

## 2023-05-11 DIAGNOSIS — D3501 Benign neoplasm of right adrenal gland: Secondary | ICD-10-CM | POA: Insufficient documentation

## 2023-05-11 DIAGNOSIS — Z7982 Long term (current) use of aspirin: Secondary | ICD-10-CM | POA: Insufficient documentation

## 2023-05-11 DIAGNOSIS — Z510 Encounter for antineoplastic radiation therapy: Secondary | ICD-10-CM | POA: Diagnosis not present

## 2023-05-11 LAB — RAD ONC ARIA SESSION SUMMARY
Course Elapsed Days: 17
Plan Fractions Treated to Date: 14
Plan Prescribed Dose Per Fraction: 2 Gy
Plan Total Fractions Prescribed: 35
Plan Total Prescribed Dose: 70 Gy
Reference Point Dosage Given to Date: 28 Gy
Reference Point Session Dosage Given: 2 Gy
Session Number: 14

## 2023-05-11 LAB — CBC WITH DIFFERENTIAL (CANCER CENTER ONLY)
Abs Immature Granulocytes: 0.04 10*3/uL (ref 0.00–0.07)
Basophils Absolute: 0 10*3/uL (ref 0.0–0.1)
Basophils Relative: 1 %
Eosinophils Absolute: 0 10*3/uL (ref 0.0–0.5)
Eosinophils Relative: 1 %
HCT: 35.8 % — ABNORMAL LOW (ref 39.0–52.0)
Hemoglobin: 11.7 g/dL — ABNORMAL LOW (ref 13.0–17.0)
Immature Granulocytes: 1 %
Lymphocytes Relative: 24 %
Lymphs Abs: 1.2 10*3/uL (ref 0.7–4.0)
MCH: 30.4 pg (ref 26.0–34.0)
MCHC: 32.7 g/dL (ref 30.0–36.0)
MCV: 93 fL (ref 80.0–100.0)
Monocytes Absolute: 0.3 10*3/uL (ref 0.1–1.0)
Monocytes Relative: 6 %
Neutro Abs: 3.4 10*3/uL (ref 1.7–7.7)
Neutrophils Relative %: 67 %
Platelet Count: 212 10*3/uL (ref 150–400)
RBC: 3.85 MIL/uL — ABNORMAL LOW (ref 4.22–5.81)
RDW: 14.7 % (ref 11.5–15.5)
WBC Count: 5 10*3/uL (ref 4.0–10.5)
nRBC: 0 % (ref 0.0–0.2)

## 2023-05-11 LAB — CMP (CANCER CENTER ONLY)
ALT: 24 U/L (ref 0–44)
AST: 23 U/L (ref 15–41)
Albumin: 4.2 g/dL (ref 3.5–5.0)
Alkaline Phosphatase: 68 U/L (ref 38–126)
Anion gap: 7 (ref 5–15)
BUN: 7 mg/dL — ABNORMAL LOW (ref 8–23)
CO2: 24 mmol/L (ref 22–32)
Calcium: 9.2 mg/dL (ref 8.9–10.3)
Chloride: 103 mmol/L (ref 98–111)
Creatinine: 0.69 mg/dL (ref 0.61–1.24)
GFR, Estimated: >60 mL/min (ref >60)
Glucose, Bld: 98 mg/dL (ref 70–99)
Potassium: 4 mmol/L (ref 3.5–5.1)
Sodium: 134 mmol/L — ABNORMAL LOW (ref 135–145)
Total Bilirubin: 0.5 mg/dL (ref 0.3–1.2)
Total Protein: 7.1 g/dL (ref 6.5–8.1)

## 2023-05-11 MED ORDER — SODIUM CHLORIDE 0.9 % IV SOLN
10.0000 mg | Freq: Once | INTRAVENOUS | Status: AC
  Administered 2023-05-11: 10 mg via INTRAVENOUS
  Filled 2023-05-11: qty 10

## 2023-05-11 MED ORDER — SODIUM CHLORIDE 0.9 % IV SOLN
Freq: Once | INTRAVENOUS | Status: AC
  Filled 2023-05-11: qty 250

## 2023-05-11 MED ORDER — FAMOTIDINE IN NACL 20-0.9 MG/50ML-% IV SOLN
20.0000 mg | Freq: Once | INTRAVENOUS | Status: AC
  Administered 2023-05-11: 20 mg via INTRAVENOUS
  Filled 2023-05-11: qty 50

## 2023-05-11 MED ORDER — DIPHENHYDRAMINE HCL 50 MG/ML IJ SOLN
50.0000 mg | Freq: Once | INTRAMUSCULAR | Status: AC
  Administered 2023-05-11: 50 mg via INTRAVENOUS
  Filled 2023-05-11: qty 1

## 2023-05-11 MED ORDER — PALONOSETRON HCL INJECTION 0.25 MG/5ML
0.2500 mg | Freq: Once | INTRAVENOUS | Status: AC
  Administered 2023-05-11: 0.25 mg via INTRAVENOUS
  Filled 2023-05-11: qty 5

## 2023-05-11 MED ORDER — SODIUM CHLORIDE 0.9 % IV SOLN
45.0000 mg/m2 | Freq: Once | INTRAVENOUS | Status: AC
  Administered 2023-05-11: 102 mg via INTRAVENOUS
  Filled 2023-05-11: qty 17

## 2023-05-11 MED ORDER — SODIUM CHLORIDE 0.9 % IV SOLN
300.0000 mg | Freq: Once | INTRAVENOUS | Status: AC
  Administered 2023-05-11: 300 mg via INTRAVENOUS
  Filled 2023-05-11: qty 30

## 2023-05-11 NOTE — Patient Instructions (Signed)
[name]  Discharge Instructions: Thank you for choosing [facility] to provide your oncology and hematology care.  If you have a lab appointment with the [facility], please go directly to the [facility] and check in at the registration area.  Wear comfortable clothing and clothing appropriate for easy access to any Portacath or PICC line.   We strive to give you quality time with your provider. You may need to reschedule your appointment if you arrive late (15 or more minutes).  Arriving late affects you and other patients whose appointments are after yours.  Also, if you miss three or more appointments without notifying the office, you may be dismissed from the clinic at the provider's discretion.      For prescription refill requests, have your pharmacy contact our office and allow 72 hours for refills to be completed.    Today you received the following chemotherapy and/or immunotherapy agents Paclitaxel and Carboplatin.      To help prevent nausea and vomiting after your treatment, we encourage you to take your nausea medication as directed.  BELOW ARE SYMPTOMS THAT SHOULD BE REPORTED IMMEDIATELY: *FEVER GREATER THAN 100.4 F (38 C) OR HIGHER *CHILLS OR SWEATING *NAUSEA AND VOMITING THAT IS NOT CONTROLLED WITH YOUR NAUSEA MEDICATION *UNUSUAL SHORTNESS OF BREATH *UNUSUAL BRUISING OR BLEEDING *URINARY PROBLEMS (pain or burning when urinating, or frequent urination) *BOWEL PROBLEMS (unusual diarrhea, constipation, pain near the anus) TENDERNESS IN MOUTH AND THROAT WITH OR WITHOUT PRESENCE OF ULCERS (sore throat, sores in mouth, or a toothache) UNUSUAL RASH, SWELLING OR PAIN  UNUSUAL VAGINAL DISCHARGE OR ITCHING   Items with * indicate a potential emergency and should be followed up as soon as possible or go to the Emergency Department if any problems should occur.  Please show the CHEMOTHERAPY ALERT CARD or IMMUNOTHERAPY ALERT  CARD at check-in to the Emergency Department and triage nurse.  Should you have questions after your visit or need to cancel or reschedule your appointment, please contact [facility]  [phone] and follow the prompts.  Office hours are 8:00 a.m. to 4:30 p.m. Monday - Friday. Please note that voicemails left after 4:00 p.m. may not be returned until the following business day.  We are closed weekends and major holidays. You have access to a nurse at all times for urgent questions. Please call the main number to the clinic [phone] and follow the prompts.  For any non-urgent questions, you may also contact your provider using MyChart. We now offer e-Visits for anyone 53 and older to request care online for non-urgent symptoms. For details visit [email or web address].   Also download the MyChart app! Go to the app store, search "MyChart", open the app, select [facility], and log in with your MyChart username and password.

## 2023-05-11 NOTE — Assessment & Plan Note (Signed)
Patient lives in group home.  Continue follow-up with primary care provider. On Seroquel and mirtazapine

## 2023-05-11 NOTE — Assessment & Plan Note (Signed)
Encourage his cessation effort. - stopped in early Sept.

## 2023-05-11 NOTE — Assessment & Plan Note (Signed)
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 Left upper lobe adenocarcinoma, No enough tissue for NGS, will send off liquid biopsy- send off liquid biopsy.  Labs are reviewed and discussed with patient. Proceed with carboplatin AUC 2 and taxol 45mg /m2 today.

## 2023-05-11 NOTE — Progress Notes (Signed)
Hematology/Oncology Progress note [phone] [phone]        REFERRING PROVIDER: [name], MD    CHIEF COMPLAINTS/PURPOSE OF CONSULTATION:  Stage III Lung cancer  ASSESSMENT & PLAN:   Cancer Staging  Adenocarcinoma of left lung [facility]) Staging form: Lung, AJCC 8th Edition - Clinical stage from 03/22/2023: cT1, cN3, cM0 - Signed by [name], MD on 03/22/2023   Adenocarcinoma of left lung (HCC) Clinically he has Stage III left lung cancer He has right paratracheal lymph node hypermetabolic activity, although Station 7 lymph node showed atypical cell, no definite malignancy. Left upper lobe adenocarcinoma, No enough tissue for NGS, will send off liquid biopsy- send off liquid biopsy.  Labs are reviewed and discussed with patient. Proceed with carboplatin AUC 2 and taxol 45mg /m2 today.   Hypokalemia  potassium chloride daily.  Schizophrenia (HCC) Patient lives in group home.  Continue follow-up with primary care provider. On Seroquel and mirtazapine   Tobacco use Encourage his cessation effort. - stopped in early Sept.   Encounter for antineoplastic chemotherapy Chemotherapy plan as listed above.    Orders Placed This Encounter  Procedures   CBC with Differential (Cancer Center Only)    Standing Status:   Future    Standing Expiration Date:   05/31/2024   CMP (Cancer Center only)    Standing Status:   Future    Standing Expiration Date:   05/31/2024   CBC with Differential (Cancer Center Only)    Standing Status:   Future    Standing Expiration Date:   06/07/2024   CMP (Cancer Center only)    Standing Status:   Future    Standing Expiration Date:   06/07/2024   Follow up 1 week lab MD chemo  All questions were answered. The patient knows to call the clinic with any problems, questions or concerns.  [name], MD, PhD [facility] Hematology Oncology 05/11/2023    HISTORY OF PRESENTING ILLNESS:  [name] 62 y.o. male presents to  establish care for lung mass I have reviewed his chart and materials related to his cancer extensively and collaborated history with the patient. Summary of oncologic history is as follows:  12/08/2021 CT chest with contrast showed irregular solid pulmonary nodule of right upper lobe, increased in size.  Bibasilar consolidation.  Mildly enlarged right lower paratracheal lymph node.  Atherosclerosis. 01/04/2022 PET scan showed 10 mm irregular nodule in the lateral right upper lobe, suspicious for primary bronchogenic neoplasm. 2 small right paratracheal nodes, indeterminate and favored to be reactive.  Attention on follow-up.  Patient was seen by radiation oncology Dr. Rushie Chestnut.  Patient was offered SBRT to the right upper lobe nodule/presumed non-small cell lung cancer stage I.  Patient lives in a group home.  He reports chronic cough.  Some shortness of breath with exertion. He is a current everyday smoker.  Few cigarettes per day.  He denies any unintentional weight loss, night sweats, fever or chills. Patient is on aspirin and Plavix   INTERVAL HISTORY [name] is a 61 y.o. male who has above history reviewed by me today presents for follow up visit for lung cancer.  Oncology History  Adenocarcinoma of left lung (HCC)  12/29/2022 Imaging   CT chest with contrast showed Stable radiation changes involving the right upper lobe with a small residual treated nodule.  Decrease in size.  Interval enlargement of the left upper lobe pulmonary nodule.  Stable mediastinal nodes stable advanced emphysematous changes and areas  of pulmonary scarring.  Stable bilateral adrenal gland adenomas.  Emphysema   01/30/2023 Imaging   PET 1. Recurrent bronchogenic carcinoma in the left upper lobe with new and increasingly hypermetabolic mediastinal lymph nodes. No evidence of distant metastatic disease. 2. Possible mild prostate hypermetabolism. Consider laboratory correlation. 3. Liver is mildly heterogeneous,  raising suspicion for steatosis. 4. Cholelithiasis. 5. Right adrenal adenoma.  Left adrenal myelolipoma. 6. Aortic atherosclerosis (ICD10-I70.0). Coronary artery calcification. 7. Enlarged pulmonic trunk, indicative of pulmonary arterial hypertension.   03/15/2023 Initial Diagnosis   Adenocarcinoma of left lung   Bronchoscopy showed  Lung, biopsy, Left upper lobe - NON-SMALL CELL CARCINOMA, FAVOR ADENOCARCINOMA.  1. Bronchial Lavage, Left upper lobe - POSITIVE FOR MALIGNANCY. - NON-SMALL CELL CARCINOMA, FAVOR ADENOCARCINOMA. - SEE NOTE. 2. Bronchial Brushing, Left upper lobe - POSITIVE FOR MALIGNANCY. - NON-SMALL CELL CARCINOMA, FAVOR ADENOCARCINOMA. 3. Bronchus, biopsy, left upper lobe - SEE [id number] 4. Fine Needle Aspiration, left upper lobe - POSITIVE FOR MALIGNANCY. - NON-SMALL CELL CARCINOMA, FAVOR ADENOCARCINOMA. 5. Fine Needle Aspiration, station 7 lymph node - ATYPICAL. - BACKGROUND LYMPHOID TISSUE IS PRESENT CONSISTENT WITH LYMPH NODE SAMPLING.    03/15/2023 Procedure   Fiberoptic bronchoscopy with airway inspection and BAL Procedure findings:   Bronchoscope was inserted via ETT  without difficulty.  Posterior oropharynx, epiglottis, arytenoids, false cords and vocal cords were not visualized as these were bypassed by endotracheal tube. The distal trachea was normal in circumference and appearance without mucosal, cartilaginous or branching abnormalities.  The main carina was mildly splayed . All right and left lobar airways were visualized to the Subsegmental level.  Sub- sub segmental carinae were identified in all the distal airways.   Secretions were visible in the following airways and appeared to be clear.  The mucosa was : friable at LEFT UPPER LOBE   Airways were notable for:        exophytic lesions :n       extrinsic compression in the following distributions: n.       Friable mucosa: y       Teacher, music /pigmentation: n   MUCUS PLUGGING WAS  SEVERE ON LEFT SIDE AND COMPLETE OBSTRUCTED MULTIPLE AIRWAYS.  UNABLE TO NAVIGATE THROUGH TRACHEOBRONCHIAL TREE DUE TO SEVERE MUCUS PLUGGING. THERAPEUTIC ASPIRATION WAS PERFORMED X 7 AND WAS DONE BILATERALLY ALTHOUGH WORSE ON LEFT.  BAL WAS PERFORMED AT LEFT UPPER LOBE X 2.     Endobronchial ultrasound assisted hilar and mediastinal lymph node biopsies procedure findings: The fiberoptic bronchoscope was removed and the EBUS scope was introduced. Examination began to evaluate for pathologically enlarged lymph nodes starting on the RIGHT  side progressing to the LEFT side.  All lymph node biopsies performed with 21G  needle. Lymph node biopsies were sent in cytolite for all stations.   STATION 10R - 6mm not biopsied STATION 7 - 1.2cm biopsied 3 times STATION 10L - 5mm not biopsied  STATION 4L - 6mm not biopsied    03/22/2023 Cancer Staging   Staging form: Lung, AJCC 8th Edition - Clinical stage from 03/22/2023: cT1, cN3, cM0 - Signed by [name], MD on 03/22/2023 Stage prefix: Initial diagnosis   04/20/2023 -  Chemotherapy   Patient is on Treatment Plan : LUNG Carboplatin + Paclitaxel + XRT q7d        Patient presents for evaluation prior to chemotherapy. He reports feel well Tolerated chemotherapy, no nausea vomiting, diarrhea. Denies numbness tingling. No new complaints.     MEDICAL HISTORY:  Past Medical History:  Diagnosis Date   Acute on chronic respiratory failure with hypoxia (HCC)    Asthma    Coagulation disorder (HCC)    COPD (chronic obstructive pulmonary disease) (HCC)    Depression    History of hiatal hernia    Hypertension    Hypokalemia    Leukocytosis    Lung mass    Pneumonia    Schizophrenia (HCC)    Shortness of breath dyspnea    Stroke [facility])    Umbilical hernia    Ventral hernia     SURGICAL HISTORY: Past Surgical History:  Procedure Laterality Date   COLONOSCOPY WITH PROPOFOL N/A 09/21/2021   Procedure: COLONOSCOPY WITH PROPOFOL;  Surgeon: Regis Bill, MD;  Location: [facility] ENDOSCOPY;  Service: Endoscopy;  Laterality: N/A;   HERNIA REPAIR     INSERTION OF MESH N/A 12/18/2014   Procedure: INSERTION OF MESH;  Surgeon: Lattie Haw, MD;  Location: [facility] ORS;  Service: General;  Laterality: N/A;   SUPRA-UMBILICAL HERNIA  12/18/2014   Procedure: SUPRA-UMBILICAL HERNIA;  Surgeon: Lattie Haw, MD;  Location: [facility] ORS;  Service: General;;   UMBILICAL HERNIA REPAIR N/A 12/18/2014   Procedure: HERNIA REPAIR UMBILICAL ADULT;  Surgeon: Lattie Haw, MD;  Location: [facility] ORS;  Service: General;  Laterality: N/A;   VIDEO BRONCHOSCOPY WITH ENDOBRONCHIAL ULTRASOUND N/A 03/15/2023   Procedure: VIDEO BRONCHOSCOPY WITH ENDOBRONCHIAL ULTRASOUND;  Surgeon: Vida Rigger, MD;  Location: [facility] ORS;  Service: Thoracic;  Laterality: N/A;    SOCIAL HISTORY: Social History   Socioeconomic History   Marital status: Widowed    Spouse name: Not on file   Number of children: Not on file   Years of education: Not on file   Highest education level: Not on file  Occupational History   Not on file  Tobacco Use   Smoking status: Every Day    Current packs/day: 0.15    Average packs/day: 0.2 packs/day for 45.0 years (6.8 ttl pk-yrs)    Types: Cigarettes   Smokeless tobacco: Never  Vaping Use   Vaping status: Never Used  Substance and Sexual Activity   Alcohol use: Not Currently    Alcohol/week: 75.0 standard drinks of alcohol    Types: 75 Cans of beer per week   Drug use: Not Currently    Types: Marijuana, "Crack" cocaine    Comment: none in 15 yrs   Sexual activity: Not Currently  Other Topics Concern   Not on file  Social History Narrative   Not on file   Social Determinants of Health   Financial Resource Strain: Not on file  Food Insecurity: No Food Insecurity (03/25/2023)   Hunger Vital Sign    Worried About Running Out of Food in the Last Year: Never true    Ran Out of Food in the Last Year: Never true  Transportation Needs: No  Transportation Needs (03/25/2023)   PRAPARE - Administrator, Civil Service (Medical): No    Lack of Transportation (Non-Medical): No  Physical Activity: Not on file  Stress: Not on file  Social Connections: Not on file  Intimate Partner Violence: Not At Risk (03/25/2023)   Humiliation, Afraid, Rape, and Kick questionnaire    Fear of Current or Ex-Partner: No    Emotionally Abused: No    Physically Abused: No    Sexually Abused: No    FAMILY HISTORY: Family History  Problem Relation Age of Onset   Asthma Mother    Hypertension Father  ALLERGIES:  has No Known Allergies.  MEDICATIONS:  Current Outpatient Medications  Medication Sig Dispense Refill   albuterol (VENTOLIN HFA) 108 (90 Base) MCG/ACT inhaler Inhale 2 puffs into the lungs every 4 (four) hours as needed for wheezing or shortness of breath.     aspirin EC 81 MG tablet Take 81 mg by mouth daily. Swallow whole.     clonazePAM (KLONOPIN) 1 MG tablet Take 1 tablet (1 mg total) by mouth at bedtime. 30 tablet 0   clopidogrel (PLAVIX) 75 MG tablet Take 1 tablet (75 mg total) by mouth daily. 30 tablet 3   diltiazem (CARDIZEM CD) 240 MG 24 hr capsule Take 1 capsule (240 mg total) by mouth daily. 30 capsule 3   docusate sodium (COLACE) 100 MG capsule Take 100 mg by mouth 2 (two) times daily as needed for mild constipation.     famotidine (PEPCID) 20 MG tablet Take 1 tablet (20 mg total) by mouth daily. 30 tablet 0   Fluticasone-Umeclidin-Vilant (TRELEGY ELLIPTA) 100-62.5-25 MCG/ACT AEPB Inhale 1 puff into the lungs daily. 28 each 1   guaiFENesin (MUCINEX) 600 MG 12 hr tablet Take 600 mg by mouth 2 (two) times daily as needed.     ipratropium-albuterol (DUONEB) 0.5-2.5 (3) MG/3ML SOLN Take 3 mLs by nebulization every 6 (six) hours as needed (every 4 to 6 hours as needed for shortnes of breath or wheeziing).      ketoconazole (NIZORAL) 2 % cream Apply to both feet and between toes once daily for 6 weeks. 60 g 1    lactulose (CHRONULAC) 10 GM/15ML solution Take 15 mLs (10 g total) by mouth daily as needed for severe constipation. 236 mL 0   lovastatin (MEVACOR) 20 MG tablet Take 20 mg by mouth at bedtime.     mirtazapine (REMERON) 30 MG tablet Take 1 tablet (30 mg total) by mouth at bedtime. 30 tablet 3   montelukast (SINGULAIR) 10 MG tablet Take 1 tablet (10 mg total) by mouth at bedtime. 30 tablet 1   Multiple Vitamins-Minerals (MULTIVITAMIN WITH MINERALS) tablet Take 1 tablet by mouth daily.     Omega-3 Fatty Acids (FISH OIL) 1000 MG CPDR Take 1,000 mg by mouth at bedtime.     paliperidone (INVEGA) 9 MG 24 hr tablet Take 1 tablet (9 mg total) by mouth at bedtime. 30 tablet 3   potassium chloride (KLOR-CON M) 10 MEQ tablet Take 1 tablet (10 mEq total) by mouth daily. 30 tablet 0   prochlorperazine (COMPAZINE) 10 MG tablet Take 1 tablet (10 mg total) by mouth every 6 (six) hours as needed for nausea or vomiting. 90 tablet 0   Pyridoxine HCl (B-6) 100 MG TABS Take 1 tablet by mouth daily.     traZODone (DESYREL) 150 MG tablet Take 1 tablet (150 mg total) by mouth at bedtime as needed for sleep. 30 tablet 3   VITAMIN D, ERGOCALCIFEROL, PO Take 1,000 Units by mouth daily.      dextromethorphan-guaiFENesin (ROBITUSSIN-DM) 10-100 MG/5ML liquid Take 10 mLs by mouth every 6 (six) hours as needed for cough. (Patient not taking: Reported on 03/07/2023)     No current facility-administered medications for this visit.    Review of Systems  Constitutional:  Negative for appetite change, chills, fatigue, fever and unexpected weight change.  HENT:   Negative for hearing loss and voice change.   Eyes:  Negative for eye problems and icterus.  Respiratory:  Positive for cough and shortness of breath. Negative for chest tightness and  hemoptysis.   Cardiovascular:  Negative for chest pain and leg swelling.  Gastrointestinal:  Negative for abdominal distention and abdominal pain.  Endocrine: Negative for hot flashes.   Genitourinary:  Negative for difficulty urinating, dysuria and frequency.   Musculoskeletal:  Negative for arthralgias.  Skin:  Negative for itching and rash.  Neurological:  Negative for light-headedness and numbness.  Hematological:  Negative for adenopathy. Does not bruise/bleed easily.  Psychiatric/Behavioral:  Negative for confusion.      PHYSICAL EXAMINATION: ECOG PERFORMANCE STATUS: 0 - Asymptomatic  Vitals:   05/11/23 1157  BP: 122/87  Pulse: 94  Resp: 18  Temp: 97.8 F (36.6 C)  SpO2: 98%   Filed Weights   05/11/23 1157  Weight: 230 lb (104.3 kg)    Physical Exam Constitutional:      General: He is not in acute distress.    Appearance: He is not diaphoretic.  HENT:     Head: Normocephalic and atraumatic.  Eyes:     General: No scleral icterus. Cardiovascular:     Rate and Rhythm: Normal rate and regular rhythm.  Pulmonary:     Effort: Pulmonary effort is normal. No respiratory distress.     Comments: Decreased breath sound bilaterally. Abdominal:     General: There is no distension.     Palpations: Abdomen is soft.     Tenderness: There is no abdominal tenderness.  Musculoskeletal:        General: Normal range of motion.     Cervical back: Normal range of motion and neck supple.  Skin:    General: Skin is warm and dry.     Findings: No erythema.  Neurological:     Mental Status: He is alert and oriented to person, place, and time. Mental status is at baseline.     Motor: No abnormal muscle tone.  Psychiatric:        Mood and Affect: Affect normal.     Comments: Flat      LABORATORY DATA:  I have reviewed the data as listed    Latest Ref Rng & Units 05/11/2023   11:43 AM 05/04/2023    8:18 AM 04/27/2023    9:23 AM  CBC  WBC 4.0 - 10.5 K/uL 5.0  6.0  7.4   Hemoglobin 13.0 - 17.0 g/dL 21.3  08.6  57.8   Hematocrit 39.0 - 52.0 % 35.8  35.6  35.8   Platelets 150 - 400 K/uL 212  190  204       Latest Ref Rng & Units 05/11/2023   11:43 AM  05/04/2023    8:18 AM 04/27/2023    9:22 AM  CMP  Glucose 70 - 99 mg/dL 98  469  96   BUN 8 - 23 mg/dL 7  9  10    Creatinine 0.61 - 1.24 mg/dL 6.29  5.28  4.13   Sodium 135 - 145 mmol/L 134  137  137   Potassium 3.5 - 5.1 mmol/L 4.0  3.7  4.0   Chloride 98 - 111 mmol/L 103  106  104   CO2 22 - 32 mmol/L 24  22  25    Calcium 8.9 - 10.3 mg/dL 9.2  9.1  9.6   Total Protein 6.5 - 8.1 g/dL 7.1  6.8  7.1   Total Bilirubin 0.3 - 1.2 mg/dL 0.5  0.5  0.4   Alkaline Phos 38 - 126 U/L 68  64  62   AST 15 - 41 U/L 23  25  21   ALT 0 - 44 U/L 24  22  19       RADIOGRAPHIC STUDIES: I have personally reviewed the radiological images as listed and agreed with the findings in the report. No results found.

## 2023-05-11 NOTE — Assessment & Plan Note (Signed)
potassium chloride daily

## 2023-05-11 NOTE — Assessment & Plan Note (Signed)
Chemotherapy plan as listed above 

## 2023-05-12 ENCOUNTER — Other Ambulatory Visit: Payer: Self-pay

## 2023-05-12 ENCOUNTER — Ambulatory Visit: Payer: 59

## 2023-05-12 ENCOUNTER — Ambulatory Visit
Admission: RE | Admit: 2023-05-12 | Discharge: 2023-05-12 | Disposition: A | Discharge status: Home / Self Care | Payer: 59 | Source: Ambulatory Visit | Attending: Radiation Oncology | Admitting: Radiation Oncology

## 2023-05-12 DIAGNOSIS — Z510 Encounter for antineoplastic radiation therapy: Secondary | ICD-10-CM | POA: Diagnosis not present

## 2023-05-12 LAB — RAD ONC ARIA SESSION SUMMARY
Course Elapsed Days: 18
Plan Fractions Treated to Date: 15
Plan Prescribed Dose Per Fraction: 2 Gy
Plan Total Fractions Prescribed: 35
Plan Total Prescribed Dose: 70 Gy
Reference Point Dosage Given to Date: 30 Gy
Reference Point Session Dosage Given: 2 Gy
Session Number: 15

## 2023-05-14 NOTE — Progress Notes (Signed)
Subjective:  Patient ID: [name], male    DOB: 12-29-1960,  MRN: [id number]  [place] File presents to clinic today for at risk foot care with h/o coagulation defect and painful thick toenails that are difficult to trim. Pain interferes with ambulation. Aggravating factors include wearing enclosed shoe gear. Pain is relieved with periodic professional debridement. [facility]. Chief Complaint  Patient presents with   RFC    RFC   New problem(s): None.   PCP is Aycock, Ngwe A, MD.  No Known Allergies  Review of Systems: Negative except as noted in the HPI.  Objective: No changes noted in today's physical examination. There were no vitals filed for this visit. [name] is a pleasant 62 y.o. male in NAD. AAO x 3.  Vascular Examination: Capillary refill time <3 seconds b/l LE. Palpable pedal pulses b/l LE. Digital hair absent b/l. No pedal edema b/l. Skin temperature gradient WNL b/l. No varicosities b/l. No ischemia or gangrene noted b/l LE. No cyanosis or clubbing noted b/l LE.Marland Kitchen  Dermatological Examination: Pedal skin with normal turgor, texture and tone b/l. No open wounds. No interdigital macerations b/l. Toenails 1-5 b/l thickened, discolored, dystrophic with subungual debris. There is pain on palpation to dorsal aspect of nailplates.   He still has some diffuse scaling noted peripherally and plantarly b/l feet.  No interdigital macerations.  No blisters, no weeping. No signs of secondary bacterial infection noted..  Neurological Examination: Protective sensation intact with 10 gram monofilament b/l LE. Vibratory sensation intact b/l LE.   Musculoskeletal Examination: Muscle strength 5/5 to all lower extremity muscle groups bilaterally. No pain, crepitus or joint limitation noted with ROM bilateral LE.  Assessment/Plan: 1. Pain due to onychomycosis of toenails of both feet    -Caregiver/provider present with patient on today's visit. -Consent given  for treatment as described below: -Continue Ketoconazole Cream once daily for 6 weeks. -Patient to continue soft, supportive shoe gear daily. -Toenails 1-5 b/l were debrided in length and girth with sterile nail nippers and dremel without iatrogenic bleeding.  -Patient/POA to call should there be question/concern in the interim.   Return in about 3 months (around 08/11/2023).  Freddie Breech, DPM

## 2023-05-15 ENCOUNTER — Ambulatory Visit: Payer: 59

## 2023-05-15 ENCOUNTER — Ambulatory Visit
Admission: RE | Admit: 2023-05-15 | Discharge: 2023-05-15 | Disposition: A | Discharge status: Home / Self Care | Payer: 59 | Source: Ambulatory Visit | Attending: Radiation Oncology | Admitting: Radiation Oncology

## 2023-05-15 ENCOUNTER — Other Ambulatory Visit: Payer: Self-pay

## 2023-05-15 DIAGNOSIS — Z510 Encounter for antineoplastic radiation therapy: Secondary | ICD-10-CM | POA: Diagnosis not present

## 2023-05-15 LAB — RAD ONC ARIA SESSION SUMMARY
Course Elapsed Days: 21
Plan Fractions Treated to Date: 16
Plan Prescribed Dose Per Fraction: 2 Gy
Plan Total Fractions Prescribed: 35
Plan Total Prescribed Dose: 70 Gy
Reference Point Dosage Given to Date: 32 Gy
Reference Point Session Dosage Given: 2 Gy
Session Number: 16

## 2023-05-16 ENCOUNTER — Other Ambulatory Visit: Payer: Self-pay

## 2023-05-16 ENCOUNTER — Ambulatory Visit
Admission: RE | Admit: 2023-05-16 | Discharge: 2023-05-16 | Disposition: A | Discharge status: Home / Self Care | Payer: 59 | Source: Ambulatory Visit | Attending: Radiation Oncology | Admitting: Radiation Oncology

## 2023-05-16 ENCOUNTER — Ambulatory Visit: Payer: 59

## 2023-05-16 DIAGNOSIS — Z510 Encounter for antineoplastic radiation therapy: Secondary | ICD-10-CM | POA: Diagnosis not present

## 2023-05-16 LAB — RAD ONC ARIA SESSION SUMMARY
Course Elapsed Days: 22
Plan Fractions Treated to Date: 17
Plan Prescribed Dose Per Fraction: 2 Gy
Plan Total Fractions Prescribed: 35
Plan Total Prescribed Dose: 70 Gy
Reference Point Dosage Given to Date: 34 Gy
Reference Point Session Dosage Given: 2 Gy
Session Number: 17

## 2023-05-17 ENCOUNTER — Ambulatory Visit
Admission: RE | Admit: 2023-05-17 | Discharge: 2023-05-17 | Disposition: A | Discharge status: Home / Self Care | Payer: 59 | Source: Ambulatory Visit | Attending: Radiation Oncology | Admitting: Radiation Oncology

## 2023-05-17 ENCOUNTER — Ambulatory Visit: Payer: 59

## 2023-05-17 ENCOUNTER — Other Ambulatory Visit: Payer: Self-pay

## 2023-05-17 DIAGNOSIS — Z510 Encounter for antineoplastic radiation therapy: Secondary | ICD-10-CM | POA: Diagnosis not present

## 2023-05-17 LAB — RAD ONC ARIA SESSION SUMMARY
Course Elapsed Days: 23
Plan Fractions Treated to Date: 18
Plan Prescribed Dose Per Fraction: 2 Gy
Plan Total Fractions Prescribed: 35
Plan Total Prescribed Dose: 70 Gy
Reference Point Dosage Given to Date: 36 Gy
Reference Point Session Dosage Given: 2 Gy
Session Number: 18

## 2023-05-17 MED FILL — Dexamethasone Sodium Phosphate Inj 100 MG/10ML: INTRAMUSCULAR | Qty: 1 | Status: AC

## 2023-05-18 ENCOUNTER — Ambulatory Visit
Admission: RE | Admit: 2023-05-18 | Discharge: 2023-05-18 | Disposition: A | Discharge status: Home / Self Care | Payer: 59 | Source: Ambulatory Visit | Attending: Radiation Oncology | Admitting: Radiation Oncology

## 2023-05-18 ENCOUNTER — Ambulatory Visit: Payer: 59

## 2023-05-18 ENCOUNTER — Other Ambulatory Visit: Payer: Self-pay

## 2023-05-18 ENCOUNTER — Inpatient Hospital Stay (HOSPITAL_BASED_OUTPATIENT_CLINIC_OR_DEPARTMENT_OTHER): Payer: 59 | Admitting: Oncology

## 2023-05-18 ENCOUNTER — Encounter: Payer: Self-pay | Admitting: Oncology

## 2023-05-18 ENCOUNTER — Inpatient Hospital Stay: Payer: 59

## 2023-05-18 VITALS — BP 124/87 | HR 94 | Temp 97.5°F | Resp 18 | Wt 230.2 lb

## 2023-05-18 DIAGNOSIS — C3492 Malignant neoplasm of unspecified part of left bronchus or lung: Secondary | ICD-10-CM

## 2023-05-18 DIAGNOSIS — E876 Hypokalemia: Secondary | ICD-10-CM

## 2023-05-18 DIAGNOSIS — Z5111 Encounter for antineoplastic chemotherapy: Secondary | ICD-10-CM

## 2023-05-18 DIAGNOSIS — Z510 Encounter for antineoplastic radiation therapy: Secondary | ICD-10-CM | POA: Diagnosis not present

## 2023-05-18 LAB — CBC WITH DIFFERENTIAL (CANCER CENTER ONLY)
Abs Immature Granulocytes: 0.04 10*3/uL (ref 0.00–0.07)
Basophils Absolute: 0 10*3/uL (ref 0.0–0.1)
Basophils Relative: 0 %
Eosinophils Absolute: 0 10*3/uL (ref 0.0–0.5)
Eosinophils Relative: 0 %
HCT: 32.1 % — ABNORMAL LOW (ref 39.0–52.0)
Hemoglobin: 10.7 g/dL — ABNORMAL LOW (ref 13.0–17.0)
Immature Granulocytes: 1 %
Lymphocytes Relative: 17 %
Lymphs Abs: 0.9 10*3/uL (ref 0.7–4.0)
MCH: 30.7 pg (ref 26.0–34.0)
MCHC: 33.3 g/dL (ref 30.0–36.0)
MCV: 92 fL (ref 80.0–100.0)
Monocytes Absolute: 0.3 10*3/uL (ref 0.1–1.0)
Monocytes Relative: 6 %
Neutro Abs: 4.1 10*3/uL (ref 1.7–7.7)
Neutrophils Relative %: 76 %
Platelet Count: 162 10*3/uL (ref 150–400)
RBC: 3.49 MIL/uL — ABNORMAL LOW (ref 4.22–5.81)
RDW: 15.1 % (ref 11.5–15.5)
WBC Count: 5.3 10*3/uL (ref 4.0–10.5)
nRBC: 0 % (ref 0.0–0.2)

## 2023-05-18 LAB — CMP (CANCER CENTER ONLY)
ALT: 25 U/L (ref 0–44)
AST: 23 U/L (ref 15–41)
Albumin: 3.8 g/dL (ref 3.5–5.0)
Alkaline Phosphatase: 60 U/L (ref 38–126)
Anion gap: 8 (ref 5–15)
BUN: 7 mg/dL — ABNORMAL LOW (ref 8–23)
CO2: 25 mmol/L (ref 22–32)
Calcium: 9.3 mg/dL (ref 8.9–10.3)
Chloride: 105 mmol/L (ref 98–111)
Creatinine: 0.8 mg/dL (ref 0.61–1.24)
GFR, Estimated: >60 mL/min (ref >60)
Glucose, Bld: 98 mg/dL (ref 70–99)
Potassium: 3.7 mmol/L (ref 3.5–5.1)
Sodium: 138 mmol/L (ref 135–145)
Total Bilirubin: 0.5 mg/dL (ref 0.3–1.2)
Total Protein: 6.9 g/dL (ref 6.5–8.1)

## 2023-05-18 LAB — RAD ONC ARIA SESSION SUMMARY
Course Elapsed Days: 24
Plan Fractions Treated to Date: 19
Plan Prescribed Dose Per Fraction: 2 Gy
Plan Total Fractions Prescribed: 35
Plan Total Prescribed Dose: 70 Gy
Reference Point Dosage Given to Date: 38 Gy
Reference Point Session Dosage Given: 2 Gy
Session Number: 19

## 2023-05-18 MED ORDER — SODIUM CHLORIDE 0.9 % IV SOLN
45.0000 mg/m2 | Freq: Once | INTRAVENOUS | Status: AC
  Administered 2023-05-18: 102 mg via INTRAVENOUS
  Filled 2023-05-18: qty 17

## 2023-05-18 MED ORDER — DIPHENHYDRAMINE HCL 50 MG/ML IJ SOLN
50.0000 mg | Freq: Once | INTRAMUSCULAR | Status: AC
  Administered 2023-05-18: 50 mg via INTRAVENOUS
  Filled 2023-05-18: qty 1

## 2023-05-18 MED ORDER — SODIUM CHLORIDE 0.9 % IV SOLN
Freq: Once | INTRAVENOUS | Status: AC
  Filled 2023-05-18: qty 250

## 2023-05-18 MED ORDER — SODIUM CHLORIDE 0.9 % IV SOLN
300.0000 mg | Freq: Once | INTRAVENOUS | Status: AC
  Administered 2023-05-18: 300 mg via INTRAVENOUS
  Filled 2023-05-18: qty 22.34

## 2023-05-18 MED ORDER — SODIUM CHLORIDE 0.9 % IV SOLN
10.0000 mg | Freq: Once | INTRAVENOUS | Status: AC
  Administered 2023-05-18: 10 mg via INTRAVENOUS
  Filled 2023-05-18: qty 10

## 2023-05-18 MED ORDER — PALONOSETRON HCL INJECTION 0.25 MG/5ML
0.2500 mg | Freq: Once | INTRAVENOUS | Status: AC
  Administered 2023-05-18: 0.25 mg via INTRAVENOUS
  Filled 2023-05-18: qty 5

## 2023-05-18 MED ORDER — FAMOTIDINE IN NACL 20-0.9 MG/50ML-% IV SOLN
20.0000 mg | Freq: Once | INTRAVENOUS | Status: AC
  Administered 2023-05-18: 20 mg via INTRAVENOUS
  Filled 2023-05-18: qty 50

## 2023-05-18 NOTE — Assessment & Plan Note (Signed)
potassium chloride daily

## 2023-05-18 NOTE — Progress Notes (Signed)
Hematology/Oncology Progress note [phone] [phone]        REFERRING PROVIDER: [name], MD    CHIEF COMPLAINTS/PURPOSE OF CONSULTATION:  Stage III Lung cancer  ASSESSMENT & PLAN:   Cancer Staging  Adenocarcinoma of left lung [facility]) Staging form: Lung, AJCC 8th Edition - Clinical stage from 03/22/2023: cT1, cN3, cM0 - Signed by [name], MD on 03/22/2023   Adenocarcinoma of left lung (HCC) Clinically he has Stage III left lung cancer He has right paratracheal lymph node hypermetabolic activity, although Station 7 lymph node showed atypical cell, no definite malignancy. Left upper lobe adenocarcinoma, No enough tissue for NGS, will send off liquid biopsy- send off liquid biopsy.  Labs are reviewed and discussed with patient. Proceed with carboplatin AUC 2 and taxol 45mg /m2 today.   Hypokalemia  potassium chloride daily.  Encounter for antineoplastic chemotherapy Chemotherapy plan as listed above.    No orders of the defined types were placed in this encounter.  Follow up 1 week lab MD chemo  All questions were answered. The patient knows to call the clinic with any problems, questions or concerns.  [name], MD, PhD [facility] Hematology Oncology 05/18/2023    HISTORY OF PRESENTING ILLNESS:  [name] 62 y.o. male presents to establish care for lung mass I have reviewed his chart and materials related to his cancer extensively and collaborated history with the patient. Summary of oncologic history is as follows:  12/08/2021 CT chest with contrast showed irregular solid pulmonary nodule of right upper lobe, increased in size.  Bibasilar consolidation.  Mildly enlarged right lower paratracheal lymph node.  Atherosclerosis. 01/04/2022 PET scan showed 10 mm irregular nodule in the lateral right upper lobe, suspicious for primary bronchogenic neoplasm. 2 small right paratracheal nodes, indeterminate and favored to be reactive.  Attention on  follow-up.  Patient was seen by radiation oncology Dr. Rushie Chestnut.  Patient was offered SBRT to the right upper lobe nodule/presumed non-small cell lung cancer stage I.  Patient lives in a group home.  He reports chronic cough.  Some shortness of breath with exertion. He is a current everyday smoker.  Few cigarettes per day.  He denies any unintentional weight loss, night sweats, fever or chills. Patient is on aspirin and Plavix   INTERVAL HISTORY [name] is a 62 y.o. male who has above history reviewed by me today presents for follow up visit for lung cancer.  Oncology History  Adenocarcinoma of left lung (HCC)  12/29/2022 Imaging   CT chest with contrast showed Stable radiation changes involving the right upper lobe with a small residual treated nodule.  Decrease in size.  Interval enlargement of the left upper lobe pulmonary nodule.  Stable mediastinal nodes stable advanced emphysematous changes and areas of pulmonary scarring.  Stable bilateral adrenal gland adenomas.  Emphysema   01/30/2023 Imaging   PET 1. Recurrent bronchogenic carcinoma in the left upper lobe with new and increasingly hypermetabolic mediastinal lymph nodes. No evidence of distant metastatic disease. 2. Possible mild prostate hypermetabolism. Consider laboratory correlation. 3. Liver is mildly heterogeneous, raising suspicion for steatosis. 4. Cholelithiasis. 5. Right adrenal adenoma.  Left adrenal myelolipoma. 6. Aortic atherosclerosis (ICD10-I70.0). Coronary artery calcification. 7. Enlarged pulmonic trunk, indicative of pulmonary arterial hypertension.   03/15/2023 Initial Diagnosis   Adenocarcinoma of left lung   Bronchoscopy showed  Lung, biopsy, Left upper lobe - NON-SMALL CELL CARCINOMA, FAVOR ADENOCARCINOMA.  1. Bronchial Lavage, Left upper lobe - POSITIVE FOR MALIGNANCY. - NON-SMALL CELL  CARCINOMA, FAVOR ADENOCARCINOMA. - SEE NOTE. 2. Bronchial Brushing, Left upper lobe - POSITIVE FOR  MALIGNANCY. - NON-SMALL CELL CARCINOMA, FAVOR ADENOCARCINOMA. 3. Bronchus, biopsy, left upper lobe - SEE [id number] 4. Fine Needle Aspiration, left upper lobe - POSITIVE FOR MALIGNANCY. - NON-SMALL CELL CARCINOMA, FAVOR ADENOCARCINOMA. 5. Fine Needle Aspiration, station 7 lymph node - ATYPICAL. - BACKGROUND LYMPHOID TISSUE IS PRESENT CONSISTENT WITH LYMPH NODE SAMPLING.    03/15/2023 Procedure   Fiberoptic bronchoscopy with airway inspection and BAL Procedure findings:   Bronchoscope was inserted via ETT  without difficulty.  Posterior oropharynx, epiglottis, arytenoids, false cords and vocal cords were not visualized as these were bypassed by endotracheal tube. The distal trachea was normal in circumference and appearance without mucosal, cartilaginous or branching abnormalities.  The main carina was mildly splayed . All right and left lobar airways were visualized to the Subsegmental level.  Sub- sub segmental carinae were identified in all the distal airways.   Secretions were visible in the following airways and appeared to be clear.  The mucosa was : friable at LEFT UPPER LOBE   Airways were notable for:        exophytic lesions :n       extrinsic compression in the following distributions: n.       Friable mucosa: y       Teacher, music /pigmentation: n   MUCUS PLUGGING WAS SEVERE ON LEFT SIDE AND COMPLETE OBSTRUCTED MULTIPLE AIRWAYS.  UNABLE TO NAVIGATE THROUGH TRACHEOBRONCHIAL TREE DUE TO SEVERE MUCUS PLUGGING. THERAPEUTIC ASPIRATION WAS PERFORMED X 7 AND WAS DONE BILATERALLY ALTHOUGH WORSE ON LEFT.  BAL WAS PERFORMED AT LEFT UPPER LOBE X 2.     Endobronchial ultrasound assisted hilar and mediastinal lymph node biopsies procedure findings: The fiberoptic bronchoscope was removed and the EBUS scope was introduced. Examination began to evaluate for pathologically enlarged lymph nodes starting on the RIGHT  side progressing to the LEFT side.  All lymph node biopsies  performed with 21G  needle. Lymph node biopsies were sent in cytolite for all stations.   STATION 10R - 6mm not biopsied STATION 7 - 1.2cm biopsied 3 times STATION 10L - 5mm not biopsied  STATION 4L - 6mm not biopsied    03/22/2023 Cancer Staging   Staging form: Lung, AJCC 8th Edition - Clinical stage from 03/22/2023: cT1, cN3, cM0 - Signed by [name], MD on 03/22/2023 Stage prefix: Initial diagnosis   04/20/2023 -  Chemotherapy   Patient is on Treatment Plan : LUNG Carboplatin + Paclitaxel + XRT q7d        Patient presents for evaluation prior to chemotherapy. He reports feel well Tolerated chemotherapy, no nausea vomiting, diarrhea. Denies numbness tingling. No new complaints.     MEDICAL HISTORY:  Past Medical History:  Diagnosis Date   Acute on chronic respiratory failure with hypoxia (HCC)    Asthma    Coagulation disorder (HCC)    COPD (chronic obstructive pulmonary disease) (HCC)    Depression    History of hiatal hernia    Hypertension    Hypokalemia    Leukocytosis    Lung mass    Pneumonia    Schizophrenia (HCC)    Shortness of breath dyspnea    Stroke [facility])    Umbilical hernia    Ventral hernia     SURGICAL HISTORY: Past Surgical History:  Procedure Laterality Date   COLONOSCOPY WITH PROPOFOL N/A 09/21/2021   Procedure: COLONOSCOPY WITH PROPOFOL;  Surgeon: Regis Bill, MD;  Location:  [facility] ENDOSCOPY;  Service: Endoscopy;  Laterality: N/A;   HERNIA REPAIR     INSERTION OF MESH N/A 12/18/2014   Procedure: INSERTION OF MESH;  Surgeon: Lattie Haw, MD;  Location: [facility] ORS;  Service: General;  Laterality: N/A;   SUPRA-UMBILICAL HERNIA  12/18/2014   Procedure: SUPRA-UMBILICAL HERNIA;  Surgeon: Lattie Haw, MD;  Location: [facility] ORS;  Service: General;;   UMBILICAL HERNIA REPAIR N/A 12/18/2014   Procedure: HERNIA REPAIR UMBILICAL ADULT;  Surgeon: Lattie Haw, MD;  Location: [facility] ORS;  Service: General;  Laterality: N/A;   VIDEO BRONCHOSCOPY  WITH ENDOBRONCHIAL ULTRASOUND N/A 03/15/2023   Procedure: VIDEO BRONCHOSCOPY WITH ENDOBRONCHIAL ULTRASOUND;  Surgeon: Vida Rigger, MD;  Location: [facility] ORS;  Service: Thoracic;  Laterality: N/A;    SOCIAL HISTORY: Social History   Socioeconomic History   Marital status: Widowed    Spouse name: Not on file   Number of children: Not on file   Years of education: Not on file   Highest education level: Not on file  Occupational History   Not on file  Tobacco Use   Smoking status: Every Day    Current packs/day: 0.15    Average packs/day: 0.2 packs/day for 45.0 years (6.8 ttl pk-yrs)    Types: Cigarettes   Smokeless tobacco: Never  Vaping Use   Vaping status: Never Used  Substance and Sexual Activity   Alcohol use: Not Currently    Alcohol/week: 75.0 standard drinks of alcohol    Types: 75 Cans of beer per week   Drug use: Not Currently    Types: Marijuana, "Crack" cocaine    Comment: none in 15 yrs   Sexual activity: Not Currently  Other Topics Concern   Not on file  Social History Narrative   Not on file   Social Determinants of Health   Financial Resource Strain: Not on file  Food Insecurity: No Food Insecurity (03/25/2023)   Hunger Vital Sign    Worried About Running Out of Food in the Last Year: Never true    Ran Out of Food in the Last Year: Never true  Transportation Needs: No Transportation Needs (03/25/2023)   PRAPARE - Administrator, Civil Service (Medical): No    Lack of Transportation (Non-Medical): No  Physical Activity: Not on file  Stress: Not on file  Social Connections: Not on file  Intimate Partner Violence: Not At Risk (03/25/2023)   Humiliation, Afraid, Rape, and Kick questionnaire    Fear of Current or Ex-Partner: No    Emotionally Abused: No    Physically Abused: No    Sexually Abused: No    FAMILY HISTORY: Family History  Problem Relation Age of Onset   Asthma Mother    Hypertension Father     ALLERGIES:  has No Known  Allergies.  MEDICATIONS:  Current Outpatient Medications  Medication Sig Dispense Refill   albuterol (VENTOLIN HFA) 108 (90 Base) MCG/ACT inhaler Inhale 2 puffs into the lungs every 4 (four) hours as needed for wheezing or shortness of breath.     aspirin EC 81 MG tablet Take 81 mg by mouth daily. Swallow whole.     clonazePAM (KLONOPIN) 1 MG tablet Take 1 tablet (1 mg total) by mouth at bedtime. 30 tablet 0   clopidogrel (PLAVIX) 75 MG tablet Take 1 tablet (75 mg total) by mouth daily. 30 tablet 3   diltiazem (CARDIZEM CD) 240 MG 24 hr capsule Take 1 capsule (240 mg total) by mouth daily. 30  capsule 3   docusate sodium (COLACE) 100 MG capsule Take 100 mg by mouth 2 (two) times daily as needed for mild constipation.     famotidine (PEPCID) 20 MG tablet Take 1 tablet (20 mg total) by mouth daily. 30 tablet 0   Fluticasone-Umeclidin-Vilant (TRELEGY ELLIPTA) 100-62.5-25 MCG/ACT AEPB Inhale 1 puff into the lungs daily. 28 each 1   guaiFENesin (MUCINEX) 600 MG 12 hr tablet Take 600 mg by mouth 2 (two) times daily as needed.     ipratropium-albuterol (DUONEB) 0.5-2.5 (3) MG/3ML SOLN Take 3 mLs by nebulization every 6 (six) hours as needed (every 4 to 6 hours as needed for shortnes of breath or wheeziing).      ketoconazole (NIZORAL) 2 % cream Apply to both feet and between toes once daily for 6 weeks. 60 g 1   lactulose (CHRONULAC) 10 GM/15ML solution Take 15 mLs (10 g total) by mouth daily as needed for severe constipation. 236 mL 0   lovastatin (MEVACOR) 20 MG tablet Take 20 mg by mouth at bedtime.     mirtazapine (REMERON) 30 MG tablet Take 1 tablet (30 mg total) by mouth at bedtime. 30 tablet 3   montelukast (SINGULAIR) 10 MG tablet Take 1 tablet (10 mg total) by mouth at bedtime. 30 tablet 1   Multiple Vitamins-Minerals (MULTIVITAMIN WITH MINERALS) tablet Take 1 tablet by mouth daily.     Omega-3 Fatty Acids (FISH OIL) 1000 MG CPDR Take 1,000 mg by mouth at bedtime.     paliperidone (INVEGA) 9  MG 24 hr tablet Take 1 tablet (9 mg total) by mouth at bedtime. 30 tablet 3   potassium chloride (KLOR-CON M) 10 MEQ tablet Take 1 tablet (10 mEq total) by mouth daily. 30 tablet 0   prochlorperazine (COMPAZINE) 10 MG tablet Take 1 tablet (10 mg total) by mouth every 6 (six) hours as needed for nausea or vomiting. 90 tablet 0   Pyridoxine HCl (B-6) 100 MG TABS Take 1 tablet by mouth daily.     traZODone (DESYREL) 150 MG tablet Take 1 tablet (150 mg total) by mouth at bedtime as needed for sleep. 30 tablet 3   VITAMIN D, ERGOCALCIFEROL, PO Take 1,000 Units by mouth daily.      dextromethorphan-guaiFENesin (ROBITUSSIN-DM) 10-100 MG/5ML liquid Take 10 mLs by mouth every 6 (six) hours as needed for cough. (Patient not taking: Reported on 03/07/2023)     No current facility-administered medications for this visit.    Review of Systems  Constitutional:  Negative for appetite change, chills, fatigue, fever and unexpected weight change.  HENT:   Negative for hearing loss and voice change.   Eyes:  Negative for eye problems and icterus.  Respiratory:  Positive for cough and shortness of breath. Negative for chest tightness and hemoptysis.   Cardiovascular:  Negative for chest pain and leg swelling.  Gastrointestinal:  Negative for abdominal distention and abdominal pain.  Endocrine: Negative for hot flashes.  Genitourinary:  Negative for difficulty urinating, dysuria and frequency.   Musculoskeletal:  Negative for arthralgias.  Skin:  Negative for itching and rash.  Neurological:  Negative for light-headedness and numbness.  Hematological:  Negative for adenopathy. Does not bruise/bleed easily.  Psychiatric/Behavioral:  Negative for confusion.      PHYSICAL EXAMINATION: ECOG PERFORMANCE STATUS: 0 - Asymptomatic  Vitals:   05/18/23 0855  BP: 124/87  Pulse: 94  Resp: 18  Temp: (!) 97.5 F (36.4 C)   Filed Weights   05/18/23 0855  Weight: 230  lb 3.2 oz (104.4 kg)    Physical  Exam Constitutional:      General: He is not in acute distress.    Appearance: He is not diaphoretic.  HENT:     Head: Normocephalic and atraumatic.  Eyes:     General: No scleral icterus. Cardiovascular:     Rate and Rhythm: Normal rate and regular rhythm.  Pulmonary:     Effort: Pulmonary effort is normal. No respiratory distress.     Comments: Decreased breath sound bilaterally. Abdominal:     General: There is no distension.     Palpations: Abdomen is soft.     Tenderness: There is no abdominal tenderness.  Musculoskeletal:        General: Normal range of motion.     Cervical back: Normal range of motion and neck supple.  Skin:    General: Skin is warm and dry.     Findings: No erythema.  Neurological:     Mental Status: He is alert and oriented to person, place, and time. Mental status is at baseline.     Motor: No abnormal muscle tone.  Psychiatric:        Mood and Affect: Affect normal.     Comments: Flat      LABORATORY DATA:  I have reviewed the data as listed    Latest Ref Rng & Units 05/18/2023    8:21 AM 05/11/2023   11:43 AM 05/04/2023    8:18 AM  CBC  WBC 4.0 - 10.5 K/uL 5.3  5.0  6.0   Hemoglobin 13.0 - 17.0 g/dL 46.9  62.9  52.8   Hematocrit 39.0 - 52.0 % 32.1  35.8  35.6   Platelets 150 - 400 K/uL 162  212  190       Latest Ref Rng & Units 05/18/2023    8:21 AM 05/11/2023   11:43 AM 05/04/2023    8:18 AM  CMP  Glucose 70 - 99 mg/dL 98  98  413   BUN 8 - 23 mg/dL 7  7  9    Creatinine 0.61 - 1.24 mg/dL 2.44  0.10  2.72   Sodium 135 - 145 mmol/L 138  134  137   Potassium 3.5 - 5.1 mmol/L 3.7  4.0  3.7   Chloride 98 - 111 mmol/L 105  103  106   CO2 22 - 32 mmol/L 25  24  22    Calcium 8.9 - 10.3 mg/dL 9.3  9.2  9.1   Total Protein 6.5 - 8.1 g/dL 6.9  7.1  6.8   Total Bilirubin 0.3 - 1.2 mg/dL 0.5  0.5  0.5   Alkaline Phos 38 - 126 U/L 60  68  64   AST 15 - 41 U/L 23  23  25    ALT 0 - 44 U/L 25  24  22       RADIOGRAPHIC STUDIES: I have  personally reviewed the radiological images as listed and agreed with the findings in the report. No results found.

## 2023-05-18 NOTE — Assessment & Plan Note (Signed)
Chemotherapy plan as listed above 

## 2023-05-18 NOTE — Patient Instructions (Signed)
[facility]  Discharge Instructions: Thank you for choosing [facility] to provide your oncology and hematology care.  If you have a lab appointment with the Cancer Center, please go directly to the Cancer Center and check in at the registration area.  Wear comfortable clothing and clothing appropriate for easy access to any Portacath or PICC line.   We strive to give you quality time with your provider. You may need to reschedule your appointment if you arrive late (15 or more minutes).  Arriving late affects you and other patients whose appointments are after yours.  Also, if you miss three or more appointments without notifying the office, you may be dismissed from the clinic at the provider's discretion.      For prescription refill requests, have your pharmacy contact our office and allow 72 hours for refills to be completed.    Today you received the following chemotherapy and/or immunotherapy agents Taxol & Paraplatin      To help prevent nausea and vomiting after your treatment, we encourage you to take your nausea medication as directed.  BELOW ARE SYMPTOMS THAT SHOULD BE REPORTED IMMEDIATELY: *FEVER GREATER THAN 100.4 F (38 C) OR HIGHER *CHILLS OR SWEATING *NAUSEA AND VOMITING THAT IS NOT CONTROLLED WITH YOUR NAUSEA MEDICATION *UNUSUAL SHORTNESS OF BREATH *UNUSUAL BRUISING OR BLEEDING *URINARY PROBLEMS (pain or burning when urinating, or frequent urination) *BOWEL PROBLEMS (unusual diarrhea, constipation, pain near the anus) TENDERNESS IN MOUTH AND THROAT WITH OR WITHOUT PRESENCE OF ULCERS (sore throat, sores in mouth, or a toothache) UNUSUAL RASH, SWELLING OR PAIN  UNUSUAL VAGINAL DISCHARGE OR ITCHING   Items with * indicate a potential emergency and should be followed up as soon as possible or go to the Emergency Department if any problems should occur.  Please show the CHEMOTHERAPY ALERT CARD or IMMUNOTHERAPY ALERT CARD at  check-in to the Emergency Department and triage nurse.  Should you have questions after your visit or need to cancel or reschedule your appointment, please contact [facility] [place] [facility]  [phone] and follow the prompts.  Office hours are 8:00 a.m. to 4:30 p.m. Monday - Friday. Please note that voicemails left after 4:00 p.m. may not be returned until the following business day.  We are closed weekends and major holidays. You have access to a nurse at all times for urgent questions. Please call the main number to the clinic [phone] and follow the prompts.  For any non-urgent questions, you may also contact your provider using MyChart. We now offer e-Visits for anyone 18 and older to request care online for non-urgent symptoms. For details visit [email or web address].   Also download the MyChart app! Go to the app store, search "MyChart", open the app, select [facility], and log in with your MyChart username and password.    

## 2023-05-18 NOTE — Assessment & Plan Note (Signed)
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 Left upper lobe adenocarcinoma, No enough tissue for NGS, will send off liquid biopsy- send off liquid biopsy.  Labs are reviewed and discussed with patient. Proceed with carboplatin AUC 2 and taxol 45mg /m2 today.

## 2023-05-19 ENCOUNTER — Other Ambulatory Visit: Payer: Self-pay | Admitting: Oncology

## 2023-05-19 ENCOUNTER — Other Ambulatory Visit: Payer: Self-pay

## 2023-05-19 ENCOUNTER — Ambulatory Visit
Admission: RE | Admit: 2023-05-19 | Discharge: 2023-05-19 | Disposition: A | Discharge status: Home / Self Care | Payer: 59 | Source: Ambulatory Visit | Attending: Radiation Oncology | Admitting: Radiation Oncology

## 2023-05-19 ENCOUNTER — Ambulatory Visit: Payer: 59

## 2023-05-19 DIAGNOSIS — Z510 Encounter for antineoplastic radiation therapy: Secondary | ICD-10-CM | POA: Diagnosis not present

## 2023-05-19 LAB — RAD ONC ARIA SESSION SUMMARY
Course Elapsed Days: 25
Plan Fractions Treated to Date: 20
Plan Prescribed Dose Per Fraction: 2 Gy
Plan Total Fractions Prescribed: 35
Plan Total Prescribed Dose: 70 Gy
Reference Point Dosage Given to Date: 40 Gy
Reference Point Session Dosage Given: 2 Gy
Session Number: 20

## 2023-05-22 ENCOUNTER — Ambulatory Visit
Admission: RE | Admit: 2023-05-22 | Discharge: 2023-05-22 | Disposition: A | Discharge status: Home / Self Care | Payer: 59 | Source: Ambulatory Visit | Attending: Radiation Oncology | Admitting: Radiation Oncology

## 2023-05-22 ENCOUNTER — Ambulatory Visit: Payer: 59

## 2023-05-22 ENCOUNTER — Other Ambulatory Visit: Payer: Self-pay

## 2023-05-22 ENCOUNTER — Encounter: Payer: Self-pay | Admitting: Oncology

## 2023-05-22 DIAGNOSIS — Z510 Encounter for antineoplastic radiation therapy: Secondary | ICD-10-CM | POA: Diagnosis not present

## 2023-05-22 LAB — RAD ONC ARIA SESSION SUMMARY
Course Elapsed Days: 28
Plan Fractions Treated to Date: 21
Plan Prescribed Dose Per Fraction: 2 Gy
Plan Total Fractions Prescribed: 35
Plan Total Prescribed Dose: 70 Gy
Reference Point Dosage Given to Date: 42 Gy
Reference Point Session Dosage Given: 2 Gy
Session Number: 21

## 2023-05-22 NOTE — Telephone Encounter (Signed)
Component Ref Range & Units 4 d ago (05/18/23) 11 d ago (05/11/23) 2 wk ago (05/04/23) 3 wk ago (04/27/23) 1 mo ago (04/20/23) 1 mo ago (03/24/23) 3 mo ago (02/03/23)  Potassium 3.5 - 5.1 mmol/L 3.7 4.0 3.7 4.0 3.3 Low  3.4 Low  3.6

## 2023-05-23 ENCOUNTER — Ambulatory Visit
Admission: RE | Admit: 2023-05-23 | Discharge: 2023-05-23 | Disposition: A | Discharge status: Home / Self Care | Payer: 59 | Source: Ambulatory Visit | Attending: Radiation Oncology | Admitting: Radiation Oncology

## 2023-05-23 ENCOUNTER — Ambulatory Visit: Payer: 59

## 2023-05-23 ENCOUNTER — Other Ambulatory Visit: Payer: Self-pay

## 2023-05-23 DIAGNOSIS — Z510 Encounter for antineoplastic radiation therapy: Secondary | ICD-10-CM | POA: Diagnosis not present

## 2023-05-23 LAB — RAD ONC ARIA SESSION SUMMARY
Course Elapsed Days: 29
Plan Fractions Treated to Date: 22
Plan Prescribed Dose Per Fraction: 2 Gy
Plan Total Fractions Prescribed: 35
Plan Total Prescribed Dose: 70 Gy
Reference Point Dosage Given to Date: 44 Gy
Reference Point Session Dosage Given: 2 Gy
Session Number: 22

## 2023-05-24 ENCOUNTER — Ambulatory Visit: Payer: 59

## 2023-05-24 ENCOUNTER — Ambulatory Visit
Admission: RE | Admit: 2023-05-24 | Discharge: 2023-05-24 | Disposition: A | Discharge status: Home / Self Care | Payer: 59 | Source: Ambulatory Visit | Attending: Radiation Oncology | Admitting: Radiation Oncology

## 2023-05-24 ENCOUNTER — Other Ambulatory Visit: Payer: Self-pay

## 2023-05-24 DIAGNOSIS — Z510 Encounter for antineoplastic radiation therapy: Secondary | ICD-10-CM | POA: Diagnosis not present

## 2023-05-24 LAB — RAD ONC ARIA SESSION SUMMARY
Course Elapsed Days: 30
Plan Fractions Treated to Date: 23
Plan Prescribed Dose Per Fraction: 2 Gy
Plan Total Fractions Prescribed: 35
Plan Total Prescribed Dose: 70 Gy
Reference Point Dosage Given to Date: 46 Gy
Reference Point Session Dosage Given: 2 Gy
Session Number: 23

## 2023-05-24 MED FILL — Dexamethasone Sodium Phosphate Inj 100 MG/10ML: INTRAMUSCULAR | Qty: 1 | Status: AC

## 2023-05-25 ENCOUNTER — Ambulatory Visit: Payer: 59

## 2023-05-25 ENCOUNTER — Inpatient Hospital Stay: Payer: 59 | Admitting: Oncology

## 2023-05-25 ENCOUNTER — Inpatient Hospital Stay: Payer: 59

## 2023-05-25 ENCOUNTER — Emergency Department: Payer: 59

## 2023-05-25 ENCOUNTER — Other Ambulatory Visit: Payer: Self-pay

## 2023-05-25 ENCOUNTER — Inpatient Hospital Stay
Admission: EM | Admit: 2023-05-25 | Discharge: 2023-06-05 | DRG: 871 | Disposition: A | Discharge status: Home / Self Care | Payer: 59 | Attending: Internal Medicine | Admitting: Internal Medicine

## 2023-05-25 DIAGNOSIS — C3492 Malignant neoplasm of unspecified part of left bronchus or lung: Secondary | ICD-10-CM | POA: Diagnosis present

## 2023-05-25 DIAGNOSIS — Z1152 Encounter for screening for COVID-19: Secondary | ICD-10-CM | POA: Diagnosis not present

## 2023-05-25 DIAGNOSIS — Z7982 Long term (current) use of aspirin: Secondary | ICD-10-CM

## 2023-05-25 DIAGNOSIS — Z825 Family history of asthma and other chronic lower respiratory diseases: Secondary | ICD-10-CM | POA: Diagnosis not present

## 2023-05-25 DIAGNOSIS — Z7902 Long term (current) use of antithrombotics/antiplatelets: Secondary | ICD-10-CM

## 2023-05-25 DIAGNOSIS — C3490 Malignant neoplasm of unspecified part of unspecified bronchus or lung: Secondary | ICD-10-CM | POA: Diagnosis present

## 2023-05-25 DIAGNOSIS — A419 Sepsis, unspecified organism: Secondary | ICD-10-CM | POA: Diagnosis present

## 2023-05-25 DIAGNOSIS — J189 Pneumonia, unspecified organism: Secondary | ICD-10-CM | POA: Diagnosis present

## 2023-05-25 DIAGNOSIS — E872 Acidosis, unspecified: Secondary | ICD-10-CM | POA: Diagnosis present

## 2023-05-25 DIAGNOSIS — Z8673 Personal history of transient ischemic attack (TIA), and cerebral infarction without residual deficits: Secondary | ICD-10-CM | POA: Diagnosis not present

## 2023-05-25 DIAGNOSIS — R8290 Unspecified abnormal findings in urine: Secondary | ICD-10-CM | POA: Diagnosis present

## 2023-05-25 DIAGNOSIS — F209 Schizophrenia, unspecified: Secondary | ICD-10-CM | POA: Diagnosis present

## 2023-05-25 DIAGNOSIS — Z9221 Personal history of antineoplastic chemotherapy: Secondary | ICD-10-CM

## 2023-05-25 DIAGNOSIS — W44F9XA Other object of natural or organic material, entering into or through a natural orifice, initial encounter: Secondary | ICD-10-CM | POA: Diagnosis present

## 2023-05-25 DIAGNOSIS — F17210 Nicotine dependence, cigarettes, uncomplicated: Secondary | ICD-10-CM | POA: Diagnosis present

## 2023-05-25 DIAGNOSIS — J432 Centrilobular emphysema: Secondary | ICD-10-CM | POA: Diagnosis present

## 2023-05-25 DIAGNOSIS — J441 Chronic obstructive pulmonary disease with (acute) exacerbation: Principal | ICD-10-CM | POA: Diagnosis present

## 2023-05-25 DIAGNOSIS — G4700 Insomnia, unspecified: Secondary | ICD-10-CM | POA: Diagnosis present

## 2023-05-25 DIAGNOSIS — J440 Chronic obstructive pulmonary disease with acute lower respiratory infection: Secondary | ICD-10-CM | POA: Diagnosis present

## 2023-05-25 DIAGNOSIS — I2510 Atherosclerotic heart disease of native coronary artery without angina pectoris: Secondary | ICD-10-CM | POA: Diagnosis present

## 2023-05-25 DIAGNOSIS — I10 Essential (primary) hypertension: Secondary | ICD-10-CM | POA: Diagnosis present

## 2023-05-25 DIAGNOSIS — F17200 Nicotine dependence, unspecified, uncomplicated: Secondary | ICD-10-CM | POA: Diagnosis present

## 2023-05-25 DIAGNOSIS — T17590A Other foreign object in bronchus causing asphyxiation, initial encounter: Secondary | ICD-10-CM | POA: Diagnosis present

## 2023-05-25 DIAGNOSIS — Z23 Encounter for immunization: Secondary | ICD-10-CM

## 2023-05-25 DIAGNOSIS — R6520 Severe sepsis without septic shock: Secondary | ICD-10-CM | POA: Diagnosis present

## 2023-05-25 DIAGNOSIS — E785 Hyperlipidemia, unspecified: Secondary | ICD-10-CM | POA: Diagnosis present

## 2023-05-25 DIAGNOSIS — Z8249 Family history of ischemic heart disease and other diseases of the circulatory system: Secondary | ICD-10-CM

## 2023-05-25 DIAGNOSIS — Z923 Personal history of irradiation: Secondary | ICD-10-CM

## 2023-05-25 DIAGNOSIS — D72819 Decreased white blood cell count, unspecified: Secondary | ICD-10-CM | POA: Diagnosis present

## 2023-05-25 DIAGNOSIS — Z79899 Other long term (current) drug therapy: Secondary | ICD-10-CM | POA: Diagnosis not present

## 2023-05-25 DIAGNOSIS — F203 Undifferentiated schizophrenia: Secondary | ICD-10-CM | POA: Diagnosis present

## 2023-05-25 DIAGNOSIS — R911 Solitary pulmonary nodule: Secondary | ICD-10-CM | POA: Diagnosis present

## 2023-05-25 LAB — COMPREHENSIVE METABOLIC PANEL
ALT: 20 U/L (ref 0–44)
AST: 23 U/L (ref 15–41)
Albumin: 3.6 g/dL (ref 3.5–5.0)
Alkaline Phosphatase: 61 U/L (ref 38–126)
Anion gap: 8 (ref 5–15)
BUN: 6 mg/dL — ABNORMAL LOW (ref 8–23)
CO2: 24 mmol/L (ref 22–32)
Calcium: 8.8 mg/dL — ABNORMAL LOW (ref 8.9–10.3)
Chloride: 104 mmol/L (ref 98–111)
Creatinine, Ser: 0.68 mg/dL (ref 0.61–1.24)
GFR, Estimated: >60 mL/min (ref >60)
Glucose, Bld: 120 mg/dL — ABNORMAL HIGH (ref 70–99)
Potassium: 3.4 mmol/L — ABNORMAL LOW (ref 3.5–5.1)
Sodium: 136 mmol/L (ref 135–145)
Total Bilirubin: 0.7 mg/dL (ref 0.3–1.2)
Total Protein: 6.6 g/dL (ref 6.5–8.1)

## 2023-05-25 LAB — BLOOD GAS, VENOUS
Acid-Base Excess: 1.8 mmol/L (ref 0.0–2.0)
Bicarbonate: 25.7 mmol/L (ref 20.0–28.0)
O2 Saturation: 92 %
Patient temperature: 37
pCO2, Ven: 37 mm[Hg] — ABNORMAL LOW (ref 44–60)
pH, Ven: 7.45 — ABNORMAL HIGH (ref 7.25–7.43)
pO2, Ven: 63 mm[Hg] — ABNORMAL HIGH (ref 32–45)

## 2023-05-25 LAB — URINALYSIS, W/ REFLEX TO CULTURE (INFECTION SUSPECTED)
Bilirubin Urine: NEGATIVE
Glucose, UA: NEGATIVE mg/dL
Ketones, ur: NEGATIVE mg/dL
Nitrite: POSITIVE — AB
Protein, ur: 30 mg/dL — AB
Specific Gravity, Urine: 1.025 (ref 1.005–1.030)
WBC, UA: >50 WBC/hpf (ref 0–5)
pH: 6 (ref 5.0–8.0)

## 2023-05-25 LAB — CBC WITH DIFFERENTIAL/PLATELET
Abs Immature Granulocytes: 0.01 10*3/uL (ref 0.00–0.07)
Basophils Absolute: 0 10*3/uL (ref 0.0–0.1)
Basophils Relative: 0 %
Eosinophils Absolute: 0 10*3/uL (ref 0.0–0.5)
Eosinophils Relative: 1 %
HCT: 29.8 % — ABNORMAL LOW (ref 39.0–52.0)
Hemoglobin: 10 g/dL — ABNORMAL LOW (ref 13.0–17.0)
Immature Granulocytes: 0 %
Lymphocytes Relative: 28 %
Lymphs Abs: 0.8 10*3/uL (ref 0.7–4.0)
MCH: 30.8 pg (ref 26.0–34.0)
MCHC: 33.6 g/dL (ref 30.0–36.0)
MCV: 91.7 fL (ref 80.0–100.0)
Monocytes Absolute: 0.4 10*3/uL (ref 0.1–1.0)
Monocytes Relative: 13 %
Neutro Abs: 1.7 10*3/uL (ref 1.7–7.7)
Neutrophils Relative %: 58 %
Platelets: 147 10*3/uL — ABNORMAL LOW (ref 150–400)
RBC: 3.25 MIL/uL — ABNORMAL LOW (ref 4.22–5.81)
RDW: 15.7 % — ABNORMAL HIGH (ref 11.5–15.5)
WBC: 3 10*3/uL — ABNORMAL LOW (ref 4.0–10.5)
nRBC: 0 % (ref 0.0–0.2)

## 2023-05-25 LAB — TROPONIN I (HIGH SENSITIVITY)
Troponin I (High Sensitivity): 8 ng/L (ref <18)
Troponin I (High Sensitivity): 6 ng/L (ref <18)

## 2023-05-25 LAB — RESP PANEL BY RT-PCR (RSV, FLU A&B, COVID)  RVPGX2
Influenza A by PCR: NEGATIVE
Influenza B by PCR: NEGATIVE
Resp Syncytial Virus by PCR: NEGATIVE
SARS Coronavirus 2 by RT PCR: NEGATIVE

## 2023-05-25 LAB — D-DIMER, QUANTITATIVE: D-Dimer, Quant: 1.29 ug{FEU}/mL — ABNORMAL HIGH (ref 0.00–0.50)

## 2023-05-25 LAB — LACTIC ACID, PLASMA
Lactic Acid, Venous: 2.2 mmol/L (ref 0.5–1.9)
Lactic Acid, Venous: 1.3 mmol/L (ref 0.5–1.9)

## 2023-05-25 LAB — PROCALCITONIN: Procalcitonin: <0.1 ng/mL

## 2023-05-25 MED ORDER — ENOXAPARIN SODIUM 60 MG/0.6ML IJ SOSY
50.0000 mg | PREFILLED_SYRINGE | INTRAMUSCULAR | Status: DC
  Administered 2023-05-25 – 2023-06-04 (×11): 50 mg via SUBCUTANEOUS
  Filled 2023-05-25 (×11): qty 0.6

## 2023-05-25 MED ORDER — ONDANSETRON HCL 4 MG/2ML IJ SOLN: 4.0000 mg | Freq: Four times a day (QID) | INTRAMUSCULAR | Status: AC | PRN

## 2023-05-25 MED ORDER — UMECLIDINIUM BROMIDE 62.5 MCG/ACT IN AEPB
1.0000 | INHALATION_SPRAY | Freq: Every day | RESPIRATORY_TRACT | Status: DC
  Administered 2023-05-26 – 2023-06-05 (×10): 1 via RESPIRATORY_TRACT
  Filled 2023-05-25 (×2): qty 7

## 2023-05-25 MED ORDER — IPRATROPIUM-ALBUTEROL 0.5-2.5 (3) MG/3ML IN SOLN: 3.0000 mL | RESPIRATORY_TRACT | Status: DC | PRN

## 2023-05-25 MED ORDER — FAMOTIDINE 20 MG PO TABS
20.0000 mg | ORAL_TABLET | Freq: Every day | ORAL | Status: DC
  Administered 2023-05-25 – 2023-06-05 (×12): 20 mg via ORAL
  Filled 2023-05-25 (×12): qty 1

## 2023-05-25 MED ORDER — SENNOSIDES-DOCUSATE SODIUM 8.6-50 MG PO TABS
1.0000 | ORAL_TABLET | Freq: Every evening | ORAL | Status: DC | PRN
  Administered 2023-05-27: 1 via ORAL
  Filled 2023-05-25: qty 1

## 2023-05-25 MED ORDER — METHYLPREDNISOLONE SODIUM SUCC 40 MG IJ SOLR
40.0000 mg | Freq: Every day | INTRAMUSCULAR | Status: AC
  Administered 2023-05-26: 40 mg via INTRAVENOUS
  Filled 2023-05-25: qty 1

## 2023-05-25 MED ORDER — DEXTROMETHORPHAN-GUAIFENESIN 10-100 MG/5ML PO LIQD
10.0000 mL | Freq: Four times a day (QID) | ORAL | Status: DC | PRN
  Administered 2023-05-26: 10 mL via ORAL
  Filled 2023-05-25 (×4): qty 10

## 2023-05-25 MED ORDER — NICOTINE 21 MG/24HR TD PT24: 21.0000 mg | MEDICATED_PATCH | Freq: Every day | TRANSDERMAL | Status: AC | PRN

## 2023-05-25 MED ORDER — ONDANSETRON HCL 4 MG PO TABS: 4.0000 mg | ORAL_TABLET | Freq: Four times a day (QID) | ORAL | Status: AC | PRN

## 2023-05-25 MED ORDER — CLOPIDOGREL BISULFATE 75 MG PO TABS
75.0000 mg | ORAL_TABLET | Freq: Every day | ORAL | Status: DC
  Administered 2023-05-26 – 2023-06-05 (×11): 75 mg via ORAL
  Filled 2023-05-25 (×11): qty 1

## 2023-05-25 MED ORDER — DILTIAZEM HCL ER COATED BEADS 120 MG PO CP24
240.0000 mg | ORAL_CAPSULE | Freq: Every day | ORAL | Status: DC
  Administered 2023-05-26 – 2023-06-05 (×11): 240 mg via ORAL
  Filled 2023-05-25 (×11): qty 2

## 2023-05-25 MED ORDER — TRAZODONE HCL 50 MG PO TABS
150.0000 mg | ORAL_TABLET | Freq: Every evening | ORAL | Status: DC | PRN
  Administered 2023-05-25 – 2023-06-04 (×10): 150 mg via ORAL
  Filled 2023-05-25 (×11): qty 1

## 2023-05-25 MED ORDER — IOHEXOL 350 MG/ML SOLN
100.0000 mL | Freq: Once | INTRAVENOUS | Status: AC | PRN
  Administered 2023-05-25: 100 mL via INTRAVENOUS

## 2023-05-25 MED ORDER — ACETAMINOPHEN 325 MG PO TABS
650.0000 mg | ORAL_TABLET | Freq: Four times a day (QID) | ORAL | Status: AC | PRN
  Filled 2023-05-25: qty 2

## 2023-05-25 MED ORDER — PRAVASTATIN SODIUM 20 MG PO TABS
20.0000 mg | ORAL_TABLET | Freq: Every day | ORAL | Status: DC
  Administered 2023-05-25 – 2023-06-04 (×11): 20 mg via ORAL
  Filled 2023-05-25 (×11): qty 1

## 2023-05-25 MED ORDER — IPRATROPIUM-ALBUTEROL 0.5-2.5 (3) MG/3ML IN SOLN
3.0000 mL | RESPIRATORY_TRACT | Status: DC | PRN
  Administered 2023-05-25 – 2023-05-26 (×2): 3 mL via RESPIRATORY_TRACT
  Filled 2023-05-25 (×2): qty 3

## 2023-05-25 MED ORDER — MONTELUKAST SODIUM 10 MG PO TABS
10.0000 mg | ORAL_TABLET | Freq: Every day | ORAL | Status: DC
  Administered 2023-05-25 – 2023-06-04 (×11): 10 mg via ORAL
  Filled 2023-05-25 (×11): qty 1

## 2023-05-25 MED ORDER — IPRATROPIUM-ALBUTEROL 0.5-2.5 (3) MG/3ML IN SOLN
6.0000 mL | Freq: Once | RESPIRATORY_TRACT | Status: AC
  Administered 2023-05-25: 6 mL via RESPIRATORY_TRACT
  Filled 2023-05-25: qty 3

## 2023-05-25 MED ORDER — FLUTICASONE FUROATE-VILANTEROL 100-25 MCG/ACT IN AEPB
1.0000 | INHALATION_SPRAY | Freq: Every day | RESPIRATORY_TRACT | Status: DC
  Administered 2023-05-26 – 2023-05-29 (×4): 1 via RESPIRATORY_TRACT
  Filled 2023-05-25: qty 28

## 2023-05-25 MED ORDER — IPRATROPIUM-ALBUTEROL 0.5-2.5 (3) MG/3ML IN SOLN
3.0000 mL | Freq: Four times a day (QID) | RESPIRATORY_TRACT | Status: DC
  Administered 2023-05-25: 3 mL via RESPIRATORY_TRACT
  Filled 2023-05-25: qty 3

## 2023-05-25 MED ORDER — ACETAMINOPHEN 650 MG RE SUPP: 650.0000 mg | Freq: Four times a day (QID) | RECTAL | Status: AC | PRN

## 2023-05-25 MED ORDER — SODIUM CHLORIDE 0.9 % IV SOLN
500.0000 mg | INTRAVENOUS | Status: DC
  Administered 2023-05-25 – 2023-05-26 (×2): 500 mg via INTRAVENOUS
  Filled 2023-05-25 (×2): qty 5

## 2023-05-25 MED ORDER — IPRATROPIUM-ALBUTEROL 0.5-2.5 (3) MG/3ML IN SOLN
3.0000 mL | Freq: Once | RESPIRATORY_TRACT | Status: AC
  Administered 2023-05-25: 3 mL via RESPIRATORY_TRACT
  Filled 2023-05-25: qty 3

## 2023-05-25 MED ORDER — LACTATED RINGERS IV BOLUS (SEPSIS)
1000.0000 mL | Freq: Once | INTRAVENOUS | Status: AC
  Administered 2023-05-25: 1000 mL via INTRAVENOUS

## 2023-05-25 MED ORDER — METHYLPREDNISOLONE SODIUM SUCC 125 MG IJ SOLR
125.0000 mg | Freq: Once | INTRAMUSCULAR | Status: AC
  Administered 2023-05-25: 125 mg via INTRAVENOUS
  Filled 2023-05-25: qty 2

## 2023-05-25 MED ORDER — POTASSIUM CITRATE-CITRIC ACID 1100-334 MG/5ML PO SOLN
10.0000 meq | Freq: Three times a day (TID) | ORAL | Status: AC
  Administered 2023-05-25 (×2): 10 meq via ORAL
  Filled 2023-05-25 (×3): qty 5

## 2023-05-25 MED ORDER — IPRATROPIUM-ALBUTEROL 0.5-2.5 (3) MG/3ML IN SOLN: 3.0000 mL | Freq: Four times a day (QID) | RESPIRATORY_TRACT | Status: DC

## 2023-05-25 MED ORDER — CLONAZEPAM 1 MG PO TABS
1.0000 mg | ORAL_TABLET | Freq: Every day | ORAL | Status: DC
  Administered 2023-05-25 – 2023-06-04 (×11): 1 mg via ORAL
  Filled 2023-05-25 (×12): qty 1

## 2023-05-25 MED ORDER — MIRTAZAPINE 15 MG PO TABS
30.0000 mg | ORAL_TABLET | Freq: Every day | ORAL | Status: DC
  Administered 2023-05-25 – 2023-06-04 (×11): 30 mg via ORAL
  Filled 2023-05-25 (×11): qty 2

## 2023-05-25 MED ORDER — SODIUM CHLORIDE 0.9 % IV SOLN
2.0000 g | INTRAVENOUS | Status: AC
  Administered 2023-05-25 – 2023-05-27 (×3): 2 g via INTRAVENOUS
  Filled 2023-05-25 (×3): qty 20

## 2023-05-25 MED ORDER — ASPIRIN 81 MG PO TBEC
81.0000 mg | DELAYED_RELEASE_TABLET | Freq: Every day | ORAL | Status: DC
  Administered 2023-05-26 – 2023-06-05 (×11): 81 mg via ORAL
  Filled 2023-05-25 (×11): qty 1

## 2023-05-25 MED ORDER — PALIPERIDONE ER 3 MG PO TB24
9.0000 mg | ORAL_TABLET | Freq: Every day | ORAL | Status: DC
  Administered 2023-05-25 – 2023-06-04 (×11): 9 mg via ORAL
  Filled 2023-05-25 (×12): qty 3

## 2023-05-25 NOTE — Assessment & Plan Note (Addendum)
Likely due to RLL pneumonia. 10/22 -- pt minimally improved thus far despite typical COPD management.  Reviewed case and imaging with ICU Dr. 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 --Consider consult to patient's pulmonologist, Dr. Karna Chavez tomorrow (versus close outpatient follow up) --Transition back to IV steroids  --Scheduled Duonebs and PRN albuterol --Pulmicort nebs --Incruse  --Mucinex --IS & flutter for pulmonary hygiene --Supplement O2 if sats < 90% on room air

## 2023-05-25 NOTE — Consult Note (Signed)
CODE SEPSIS - PHARMACY COMMUNICATION  **Broad Spectrum Antibiotics should be administered within 1 hour of Sepsis diagnosis**  Time Code Sepsis Called/Glaspy Received: 0808  Antibiotics Ordered: ceftriaxone, azithromycin  Time of 1st antibiotic administration: 0820  Additional action taken by pharmacy: n/a  If necessary, Name of Provider/Nurse Contacted: n/a    Bettey Costa ,PharmD Clinical Pharmacist  05/25/2023  8:35 AM

## 2023-05-25 NOTE — ED Provider Notes (Signed)
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  Reports that for the past 2 to 3 days he has had worsening shortness of breath with productive cough with green sputum.  Does feel similar to prior COPD exacerbations.  Reports chills, denies fever.  Does not wear oxygen at baseline.  In addition, reports that he has not been able to sleep in the past 6 days despite taking trazodone.  Last chemo treatment was last Thursday, was due for treatment today.    Physical Exam   Triage Vital Signs: ED Triage Vitals  Encounter Vitals Group     BP 05/25/23 0806 134/89     Systolic BP Percentile --      Diastolic BP Percentile --      Pulse Rate 05/25/23 0803 (!) 114     Resp 05/25/23 0803 (!) 26     Temp 05/25/23 0806 98.4 F (36.9 C)     Temp Source 05/25/23 0806 Oral     SpO2 05/25/23 0803 94 %     Weight 05/25/23 0803 231 lb (104.8 kg)     Height 05/25/23 0803 6' (1.829 m)     Head Circumference --      Peak Flow --      Pain Score --      Pain Loc --      Pain Education --      Exclude from Growth Chart --     Most recent vital signs: Vitals:   05/25/23 1230 05/25/23 1556  BP: (!) 143/94 (!) 152/90  Pulse: 95 (!) 102  Resp: (!) 24 20  Temp:  97.6 F (36.4 C)  SpO2: 92% 96%     General: Awake, interactive  CV:  Tachycardic with regular rhythm, normal peripheral perfusion Resp:  Poor air movement bilaterally with occasional appreciable wheeze, tachypneic with somewhat labored respirations  Abd:  Soft, nondistended.  Neuro:  Symmetric facial movement, fluid speech   ED Results / Procedures / Treatments   Labs (all labs ordered are listed, but  only abnormal results are displayed) Labs Reviewed  BLOOD GAS, VENOUS - Abnormal; Notable for the following components:      Result Value   pH, Ven 7.45 (*)    pCO2, Ven 37 (*)    pO2, Ven 63 (*)    All other components within normal limits  D-DIMER, QUANTITATIVE - Abnormal; Notable for the following components:   D-Dimer, Quant 1.29 (*)    All other components within normal limits  LACTIC ACID, PLASMA - Abnormal; Notable for the following components:   Lactic Acid, Venous 2.2 (*)    All other components within normal limits  COMPREHENSIVE METABOLIC PANEL - Abnormal; Notable for the following components:   Potassium 3.4 (*)    Glucose, Bld 120 (*)    BUN 6 (*)    Calcium 8.8 (*)    All other components within normal limits  CBC WITH DIFFERENTIAL/PLATELET - Abnormal; Notable for the following components:   WBC 3.0 (*)    RBC 3.25 (*)    Hemoglobin 10.0 (*)    HCT 29.8 (*)    RDW 15.7 (*)  Platelets 147 (*)    All other components within normal limits  URINALYSIS, W/ REFLEX TO CULTURE (INFECTION SUSPECTED) - Abnormal; Notable for the following components:   Hgb urine dipstick TRACE (*)    Protein, ur 30 (*)    Nitrite POSITIVE (*)    Leukocytes,Ua TRACE (*)    Bacteria, UA MANY (*)    All other components within normal limits  RESP PANEL BY RT-PCR (RSV, FLU A&B, COVID)  RVPGX2  CULTURE, BLOOD (ROUTINE X 2)  CULTURE, BLOOD (ROUTINE X 2)  URINE CULTURE  LACTIC ACID, PLASMA  PROCALCITONIN  TROPONIN I (HIGH SENSITIVITY)  TROPONIN I (HIGH SENSITIVITY)     EKG EKG independently reviewed interpreted by myself (ER attending) demonstrates:  EKG demonstrate sinus tachycardia at a rate of 115, PR 147, QRS 85, QTc 401, no acute ST changes  RADIOLOGY Imaging independently reviewed and interpreted by myself demonstrates:  CXR without focal consolidation  PROCEDURES:  Critical Care performed: Yes, see critical care procedure note(s)  CRITICAL CARE Performed by: Trinna Post   Total critical care time: 32 minutes  Critical care time was exclusive of separately billable procedures and treating other patients.  Critical care was necessary to treat or prevent imminent or life-threatening deterioration.  Critical care was time spent personally by me on the following activities: development of treatment plan with patient and/or surrogate as well as nursing, discussions with consultants, evaluation of patient's response to treatment, examination of patient, obtaining history from patient or surrogate, ordering and performing treatments and interventions, ordering and review of laboratory studies, ordering and review of radiographic studies, pulse oximetry and re-evaluation of patient's condition.   Procedures   MEDICATIONS ORDERED IN ED: Medications  cefTRIAXone (ROCEPHIN) 2 g in sodium chloride 0.9 % 100 mL IVPB (0 g Intravenous Stopped 05/25/23 0926)  azithromycin (ZITHROMAX) 500 mg in sodium chloride 0.9 % 250 mL IVPB (0 mg Intravenous Stopped 05/25/23 1053)  acetaminophen (TYLENOL) tablet 650 mg (has no administration in time range)    Or  acetaminophen (TYLENOL) suppository 650 mg (has no administration in time range)  ondansetron (ZOFRAN) tablet 4 mg (has no administration in time range)    Or  ondansetron (ZOFRAN) injection 4 mg (has no administration in time range)  enoxaparin (LOVENOX) injection 50 mg (has no administration in time range)  senna-docusate (Senokot-S) tablet 1 tablet (has no administration in time range)  citric acid-potassium citrate (POLYCITRA) 10 mEq/5 ml solution 10 mEq (has no administration in time range)  methylPREDNISolone sodium succinate (SOLU-MEDROL) 40 mg/mL injection 40 mg (has no administration in time range)  nicotine (NICODERM CQ - dosed in mg/24 hours) patch 21 mg (has no administration in time range)  lactated ringers bolus 1,000 mL (0 mLs Intravenous Stopped 05/25/23 1024)  ipratropium-albuterol (DUONEB) 0.5-2.5 (3)  MG/3ML nebulizer solution 6 mL (6 mLs Nebulization Given 05/25/23 0942)  methylPREDNISolone sodium succinate (SOLU-MEDROL) 125 mg/2 mL injection 125 mg (125 mg Intravenous Given 05/25/23 0916)  iohexol (OMNIPAQUE) 350 MG/ML injection 100 mL (100 mLs Intravenous Contrast Given 05/25/23 1021)  ipratropium-albuterol (DUONEB) 0.5-2.5 (3) MG/3ML nebulizer solution 3 mL (3 mLs Nebulization Given 05/25/23 1250)     IMPRESSION / MDM / ASSESSMENT AND PLAN / ED COURSE  I reviewed the triage vital signs and the nursing notes.  Differential diagnosis includes, but is not limited to, pneumonia, viral illness, COPD exacerbation, pulmonary embolism, lower suspicion ACS with clinical history  Patient's presentation is most consistent with acute presentation with potential threat to life  or bodily function.  62 year old male with history of COPD and lung cancer on chemotherapy presenting with 2 days of shortness of breath found to be tachycardic and tachypneic on presentation.  With abnormal vitals, sepsis orders were initiated with empiric Rocephin and azithromycin and 1 L of IV fluid.  Labs, EKG, x-Makeda Peeks ordered.  Nebulizer treatments and steroids ordered for suspected component of COPD exacerbation.  On reevaluation the patient, he states that his breathing is somewhat improved, but remains very concerned about his inability to sleep.  He [place] SI, HI, AVH.  Did review discharge summary from 8/30.  At that time, was admitted with insomnia and auditory hallucinations.  No indication for acute inpatient psychiatric consultation, but I did have TTS provide resources for outpatient follow-up to the patient.  Clinical Course as of 05/25/23 1634  Thu May 25, 2023  1220 Patient reevaluated.  He reports he initially had improvement in his breathing, but now feels that his breathing has worsened.  Will order another nebulizer treatment. [NR]  1244 CTA returned without evidence of PE, but is concerning for new right lower  lobe consolidation as well as a new noted pulmonary nodule.  Patient remains with some tachypnea, heart rate above 90 meeting sepsis criteria.  With this, will reach out to hospitalist team to discuss admission. [NR]    Clinical Course User Index [NR] Trinna Post, MD     FINAL CLINICAL IMPRESSION(S) / ED DIAGNOSES   Final diagnoses:  COPD exacerbation (HCC)  Community acquired pneumonia, unspecified laterality     Rx / DC Orders   ED Discharge Orders     None        Note:  This document was prepared using Dragon voice recognition software and may include unintentional dictation errors.   Trinna Post, MD 05/25/23 [id number]

## 2023-05-25 NOTE — ED Notes (Signed)
Md made aware of critical lactic 2.2

## 2023-05-25 NOTE — Assessment & Plan Note (Addendum)
Continue home paliperidone

## 2023-05-25 NOTE — ED Notes (Signed)
2 RNs at bedside. Only 1 IV access able to be obtain. Abx running at this time. LR and abx not compatible.

## 2023-05-25 NOTE — Assessment & Plan Note (Addendum)
-  Continue home trazodone 

## 2023-05-25 NOTE — H&P (Signed)
History and Physical   [name] QMV:[id number] DOB: 05-Aug-1961 DOA: 05/25/2023  PCP: Emogene Morgan, MD  Outpatient Specialists: Dr. Cathie Hoops, medical oncology Patient coming from: Group home via EMS   I have personally briefly reviewed patient's old medical records in [facility] EMR.  Chief Concern: Shortness of breath  HPI: [name] is a 62 year old male with history of depression, anxiety, CAD, hypertension, hyperlipidemia, who presents emergency department for chief concerns of shortness of breath.  Vitals in the ED showed temperature of 98.4, respiration rate of 26, heart rate of 119, blood pressure 153/90, SpO2 of 95% on 12 L high flow nasal cannula.  Serum sodium is 136, potassium 3.5, chloride 104, bicarb 24, BUN of 6, serum creatinine of 0.68, EGFR greater than 60, nonfasting blood glucose 120, WBC 3.0, hemoglobin 10.0, platelets of 147.  Lactic acid is 2.2, on repeat is 1.3.  High sensitive troponin was 8 and on repeat was 6.  UA was positive for trace leukocytes and positive for nitrates.  COVID/influenza A/influenza B/RSV PCR were negative.  ED treatment: DuoNebs x 2, Solu-Medrol 125 mg IV one-time dose, azithromycin 500 mg IV, ceftriaxone 2 g IV, LR 1 L bolus ------------------------------ At bedside, patient was able to tell me his name, age, current calendar year.  He has been having productive cough for 3 days. He denies known sick contacts.  He states that no one at the group home has been sick that he knows of.  He reports he is coughing up green mucus.  He denies blood, chest pain, abdominal pain, dysuria, hematuria, diarrhea, syncope.  Social history: He lives in a group home, [facility]. He denies tobacco use, quitting  in 04/2023. Formerly, he smoked 1/3 ppd. He denies etoh and recreational drug use.   ROS: Constitutional: no weight change, no fever ENT/Mouth: no sore throat, no rhinorrhea Eyes: no eye pain, no vision changes Cardiovascular: no chest  pain, + dyspnea,  no edema, no palpitations Respiratory: + cough, + sputum, no wheezing Gastrointestinal: no nausea, no vomiting, no diarrhea, no constipation Genitourinary: no urinary incontinence, no dysuria, no hematuria Musculoskeletal: no arthralgias, no myalgias Skin: no skin lesions, no pruritus, Neuro: + weakness, no loss of consciousness, no syncope Psych: no anxiety, no depression, + decrease appetite Heme/Lymph: no bruising, no bleeding  ED Course: Discussed with emergency medicine provider, patient requiring hospitalization for chief concerns of right lower lobe pneumonia, COPD exacerbation.  Assessment/Plan  Principal Problem:   Severe sepsis with acute organ dysfunction (HCC) Active Problems:   COPD exacerbation (HCC)   Essential hypertension   Schizophrenia (HCC)   Nicotine dependence   Adenocarcinoma of left lung (HCC)   Insomnia   Assessment and Plan:  * Severe sepsis with acute organ dysfunction (HCC) Etiology workup in progress Patient had leukopenia, elevated lactic acid, respiration rate increased, heart rate increased, organ involvement is respiratory, possible source are urine and or pneumonia Blood cultures x 2, urine culture are in process Continue with azithromycin 500 mg IV daily and ceftriaxone 2 g IV daily, to complete 5-day course Admit to PCU, inpatient  COPD exacerbation (HCC) Presumed secondary to community-acquired pneumonia in the right lower lobe DuoNebs 4 times daily, 3 doses ordered Solu-Medrol 40 mg IV daily, one-time dose ordered for 10/18 Incentive spirometry, flutter valve every 2 hours while awake Maintain SpO2 greater than 92% Continuous pulse oximetry order As needed dextromethorphan-guaifenesin 10 mL p.o. every 6 hours as needed for cough ordered  Insomnia Resumed home trazodone  150 mg nightly as needed for sleep  Adenocarcinoma of left lung (HCC) Continue patient follow-up  Nicotine dependence As needed nicotine patch  ordered  Schizophrenia (HCC) Home paliperidone 9 mg nightly resumed  Essential hypertension Home diltiazem 240 mg daily resumed  Chart reviewed.   DVT prophylaxis: Enoxaparin Code Status: Full code Diet: Heart healthy Family Communication: Attempted to call Dellie Burns, patient's sister as requested by patient.  There was no pickup, voicemail box did not indicate the individual's name.  No message was left. Disposition Plan: Pending clinical course Consults called: None at this time Admission status: PCU, inpatient  Past Medical History:  Diagnosis Date   Acute on chronic respiratory failure with hypoxia (HCC)    Asthma    Coagulation disorder (HCC)    COPD (chronic obstructive pulmonary disease) (HCC)    Depression    History of hiatal hernia    Hypertension    Hypokalemia    Leukocytosis    Lung mass    Pneumonia    Schizophrenia (HCC)    Shortness of breath dyspnea    Stroke [facility])    Umbilical hernia    Ventral hernia    Past Surgical History:  Procedure Laterality Date   COLONOSCOPY WITH PROPOFOL N/A 09/21/2021   Procedure: COLONOSCOPY WITH PROPOFOL;  Surgeon: Regis Bill, MD;  Location: [facility] ENDOSCOPY;  Service: Endoscopy;  Laterality: N/A;   HERNIA REPAIR     INSERTION OF MESH N/A 12/18/2014   Procedure: INSERTION OF MESH;  Surgeon: Lattie Haw, MD;  Location: [facility] ORS;  Service: General;  Laterality: N/A;   SUPRA-UMBILICAL HERNIA  12/18/2014   Procedure: SUPRA-UMBILICAL HERNIA;  Surgeon: Lattie Haw, MD;  Location: [facility] ORS;  Service: General;;   UMBILICAL HERNIA REPAIR N/A 12/18/2014   Procedure: HERNIA REPAIR UMBILICAL ADULT;  Surgeon: Lattie Haw, MD;  Location: [facility] ORS;  Service: General;  Laterality: N/A;   VIDEO BRONCHOSCOPY WITH ENDOBRONCHIAL ULTRASOUND N/A 03/15/2023   Procedure: VIDEO BRONCHOSCOPY WITH ENDOBRONCHIAL ULTRASOUND;  Surgeon: Vida Rigger, MD;  Location: [facility] ORS;  Service: Thoracic;  Laterality: N/A;   Social  History:  reports that he has been smoking cigarettes. He has a 6.8 pack-year smoking history. He has never used smokeless tobacco. He reports that he does not currently use alcohol after a past usage of about 75.0 standard drinks of alcohol per week. He reports that he does not currently use drugs after having used the following drugs: Marijuana and "Crack" cocaine.  No Known Allergies Family History  Problem Relation Age of Onset   Asthma Mother    Hypertension Father    Family history: Family history reviewed and not pertinent.  Prior to Admission medications   Medication Sig Start Date End Date Taking? Authorizing Provider  albuterol (VENTOLIN HFA) 108 (90 Base) MCG/ACT inhaler Inhale 2 puffs into the lungs every 4 (four) hours as needed for wheezing or shortness of breath.    [provider]  aspirin EC 81 MG tablet Take 81 mg by mouth daily. Swallow whole.    [provider]  clonazePAM (KLONOPIN) 1 MG tablet Take 1 tablet (1 mg total) by mouth at bedtime. 04/07/23   Sarina Ill, DO  clopidogrel (PLAVIX) 75 MG tablet Take 1 tablet (75 mg total) by mouth daily. 04/08/23   Sarina Ill, DO  dextromethorphan-guaiFENesin (ROBITUSSIN-DM) 10-100 MG/5ML liquid Take 10 mLs by mouth every 6 (six) hours as needed for cough. Patient not taking: Reported on 03/07/2023  [provider]  diltiazem (CARDIZEM CD) 240 MG 24 hr capsule Take 1 capsule (240 mg total) by mouth daily. 04/08/23   Sarina Ill, DO  docusate sodium (COLACE) 100 MG capsule Take 100 mg by mouth 2 (two) times daily as needed for mild constipation.    [provider]  famotidine (PEPCID) 20 MG tablet Take 1 tablet (20 mg total) by mouth daily. 12/15/21   Pennie Banter, DO  Fluticasone-Umeclidin-Vilant (TRELEGY ELLIPTA) 100-62.5-25 MCG/ACT AEPB Inhale 1 puff into the lungs daily. 12/14/21   Pennie Banter, DO  guaiFENesin (MUCINEX) 600 MG 12 hr tablet Take 600 mg  by mouth 2 (two) times daily as needed.    [provider]  ipratropium-albuterol (DUONEB) 0.5-2.5 (3) MG/3ML SOLN Take 3 mLs by nebulization every 6 (six) hours as needed (every 4 to 6 hours as needed for shortnes of breath or wheeziing).     [provider]  ketoconazole (NIZORAL) 2 % cream Apply to both feet and between toes once daily for 6 weeks. 01/26/23   Freddie Breech, DPM  lactulose (CHRONULAC) 10 GM/15ML solution Take 15 mLs (10 g total) by mouth daily as needed for severe constipation. 04/27/23   [name], MD  lovastatin (MEVACOR) 20 MG tablet Take 20 mg by mouth at bedtime.    [provider]  mirtazapine (REMERON) 30 MG tablet Take 1 tablet (30 mg total) by mouth at bedtime. 04/07/23   Sarina Ill, DO  montelukast (SINGULAIR) 10 MG tablet Take 1 tablet (10 mg total) by mouth at bedtime. 12/14/21   Pennie Banter, DO  Multiple Vitamins-Minerals (MULTIVITAMIN WITH MINERALS) tablet Take 1 tablet by mouth daily. 03/14/23   [provider]  Omega-3 Fatty Acids (FISH OIL) 1000 MG CPDR Take 1,000 mg by mouth at bedtime.    [provider]  paliperidone (INVEGA) 9 MG 24 hr tablet Take 1 tablet (9 mg total) by mouth at bedtime. 04/07/23   Sarina Ill, DO  potassium chloride (KLOR-CON M) 10 MEQ tablet Take 1 tablet (10 mEq total) by mouth daily. 04/20/23   [name], MD  potassium chloride (KLOR-CON) 10 MEQ tablet TAKE 1 TABLET BY MOUTH ONCE DAILY 05/22/23   [name], MD  prochlorperazine (COMPAZINE) 10 MG tablet Take 1 tablet (10 mg total) by mouth every 6 (six) hours as needed for nausea or vomiting. 04/20/23   [name], MD  Pyridoxine HCl (B-6) 100 MG TABS Take 1 tablet by mouth daily.    [provider]  traZODone (DESYREL) 150 MG tablet Take 1 tablet (150 mg total) by mouth at bedtime as needed for sleep. 04/07/23   Sarina Ill, DO  VITAMIN D, ERGOCALCIFEROL, PO Take 1,000 Units by mouth daily.     [provider]   Physical Exam: Vitals:   05/25/23 1200 05/25/23 1230 05/25/23 1556 05/25/23 1737  BP: (!) 135/90 (!) 143/94 (!) 152/90   Pulse: 98 95 (!) 102   Resp: (!) 24 (!) 24 20   Temp:   97.6 F (36.4 C)   TempSrc:   Oral   SpO2: 92% 92% 96% 96%  Weight:      Height:       Constitutional: appears older than chronological age, frail, NAD, calm Eyes: PERRL, lids and conjunctivae normal ENMT: Mucous membranes are moist. Posterior pharynx clear of any exudate or lesions. Age-appropriate dentition.  Dentures in place.  Hearing appropriate Neck: normal, supple, no masses, no thyromegaly  Respiratory: Decreased lung sounds in the right lower lobe, diffuse expiratory wheezing. No crackles. Normal respiratory effort. No accessory muscle use.  Cardiovascular: Regular rate and rhythm, no murmurs / rubs / gallops. No extremity edema. 2+ pedal pulses. No carotid bruits.  Abdomen: Obese abdomen, no tenderness, no masses palpated, no hepatosplenomegaly. Bowel sounds positive.  Musculoskeletal: no clubbing / cyanosis. No joint deformity upper and lower extremities. Good ROM, no contractures, no atrophy. Normal muscle tone.  Skin: no rashes, lesions, ulcers. No induration Neurologic: Sensation intact. Strength 5/5 in all 4.  Psychiatric: Normal judgment and insight. Alert and oriented x 3. Normal mood.   EKG: independently reviewed, showing sinus tachycardia with rate of 115, QTc 401  Chest x-ray on Admission: I personally reviewed and I agree with radiologist reading as below.  CT Angio Chest PE W and/or Wo Contrast  Result Date: 05/25/2023 CLINICAL DATA:  Shortness of breath, productive cough for 3 days; * Tracking Code: BO * EXAM: CT ANGIOGRAPHY CHEST WITH CONTRAST TECHNIQUE: Multidetector CT imaging of the chest was performed using the standard protocol during bolus administration of intravenous contrast. Multiplanar CT image reconstructions and MIPs were obtained to evaluate the vascular  anatomy. RADIATION DOSE REDUCTION: This exam was performed according to the departmental dose-optimization program which includes automated exposure control, adjustment of the mA and/or kV according to patient size and/or use of iterative reconstruction technique. CONTRAST:  OMNIPAQUE IOHEXOL 350 MG/ML SOLN COMPARISON:  Multiple priors, most recent chest CT dated March 10, 2023 FINDINGS: Cardiovascular: No evidence of pulmonary embolus. Limited evaluation of the segmental and subsegmental pulmonary arteries due to motion artifact. Normal heart size. No pericardial effusion. Normal caliber thoracic aorta with moderate atherosclerotic disease. Severe coronary artery calcifications. Dilated main pulmonary artery, measuring up to 3.7 cm. Mediastinum/Nodes: Esophagus and thyroid are unremarkable. No enlarged lymph nodes seen in the chest. Lungs/Pleura: Central airways are patent. Emphysema. Spiculated solid nodule of the left upper lobe measuring 14 x 13 mm on series 5, image 47, previously 16 x 14 mm. Stable posttreatment change of the right upper lobe. New right lower lobe consolidation. New solid pulmonary nodule of the right upper lobe measuring 4 mm on series 5, image 66. Upper Abdomen: Partially visualized low-attenuation renal lesions, likely simple cysts. Stable bilateral adrenal adenomas. Gallstones. Musculoskeletal: No chest wall abnormality. No acute or significant osseous findings. Review of the MIP images confirms the above findings. IMPRESSION: 1. No evidence of pulmonary embolus. Limited evaluation of the segmental and subsegmental pulmonary arteries due to motion artifact. 2. New mild right lower lobe consolidation, likely due to infection or aspiration. 3. Spiculated nodule of the right upper lobe is slightly decreased in size. 4. New small solid pulmonary nodule of the right upper lobe measuring 4 mm. Recommend attention on follow-up per clinical protocol. 5. Stable posttreatment change of the  right upper lobe. Electronically Signed   By: Allegra Lai M.D.   On: 05/25/2023 12:25   DG Chest Port 1 View  Result Date: 05/25/2023 CLINICAL DATA:  Questionable sepsis - evaluate for abnormality. Shortness of breath. History of lung cancer. COPD. Productive cough for 3 days. EXAM: PORTABLE CHEST 1 VIEW COMPARISON:  03/15/2023. FINDINGS: Redemonstration of linear areas of fibrosis/scarring overlying the right mid lung zone, laterally. There is a new linear area of probable atelectasis overlying the left lower lung zone. Bilateral lung fields are otherwise clear. No acute consolidation or lung collapse. Bilateral costophrenic angles are clear. Normal cardio-mediastinal silhouette. No acute osseous abnormalities. The  soft tissues are within normal limits. IMPRESSION: *No acute cardiopulmonary disease. Electronically Signed   By: Jules Schick M.D.   On: 05/25/2023 08:48    Labs on Admission: I have personally reviewed following labs CBC: Recent Labs  Lab 05/25/23 0808  WBC 3.0*  NEUTROABS 1.7  HGB 10.0*  HCT 29.8*  MCV 91.7  PLT 147*   Basic Metabolic Panel: Recent Labs  Lab 05/25/23 0808  NA 136  K 3.4*  CL 104  CO2 24  GLUCOSE 120*  BUN 6*  CREATININE 0.68  CALCIUM 8.8*   GFR: Estimated Creatinine Clearance: 116.8 mL/min (by C-G formula based on SCr of 0.68 mg/dL).  Liver Function Tests: Recent Labs  Lab 05/25/23 0808  AST 23  ALT 20  ALKPHOS 61  BILITOT 0.7  PROT 6.6  ALBUMIN 3.6   Urine analysis:    Component Value Date/Time   COLORURINE YELLOW 05/25/2023 0927   APPEARANCEUR CLEAR 05/25/2023 0927   APPEARANCEUR Clear 12/27/2013 1921   LABSPEC 1.025 05/25/2023 0927   LABSPEC 1.002 12/27/2013 1921   PHURINE 6.0 05/25/2023 0927   GLUCOSEU NEGATIVE 05/25/2023 0927   GLUCOSEU Negative 12/27/2013 1921   HGBUR TRACE (A) 05/25/2023 0927   BILIRUBINUR NEGATIVE 05/25/2023 0927   BILIRUBINUR Negative 12/27/2013 1921   KETONESUR NEGATIVE 05/25/2023 0927    PROTEINUR 30 (A) 05/25/2023 0927   NITRITE POSITIVE (A) 05/25/2023 0927   LEUKOCYTESUR TRACE (A) 05/25/2023 0927   LEUKOCYTESUR Negative 12/27/2013 1921   This document was prepared using Dragon Voice Recognition software and may include unintentional dictation errors.  Dr. Sedalia Muta [facility]  If 7PM-7AM, please contact overnight-coverage provider If 7AM-7PM, please contact day attending provider [email or web address]  05/25/2023, 6:39 PM

## 2023-05-25 NOTE — Assessment & Plan Note (Addendum)
Likely due to PNA.  UA abnormal but pt denies UTI symptoms. Presented with sepsis criteria of leukopenia, tachypnea, tachycardia, lactic acidosis consistent organ involvement and severe sepsis. --Initially on IV Rocephin & Zithromax --Now on PO Ceftin, PO Zithromax --Follow cultures --Monitor fever curve, CBC

## 2023-05-25 NOTE — ED Notes (Signed)
Patient transported to CT 

## 2023-05-25 NOTE — Hospital Course (Addendum)
[name] is a 62 year old male with history of depression, anxiety, CAD, hypertension, hyperlipidemia, who presented to the ED from group home on 05/25/2023 for evaluation of shortness of breath x 3 days, with productive cough. See H&P for full HPI on admission and ED course.  Patient was admitted for COPD with acute exacerbation and severe sepsis due to likely pneumonia versus UTI.  Started on IV steroids, IV antibiotics, nebulizer treatments and supportive / symptomatic care.  Further hospital course and management as outlined below.

## 2023-05-25 NOTE — Assessment & Plan Note (Addendum)
No acute issues. Follow up with Oncology as scheduled

## 2023-05-25 NOTE — Plan of Care (Signed)
Problem: Activity: Goal: Risk for activity intolerance will decrease Outcome: Progressing   Problem: Nutrition: Goal: Adequate nutrition will be maintained Outcome: Progressing   Problem: Elimination: Goal: Will not experience complications related to bowel motility Outcome: Progressing   Problem: Pain Managment: Goal: General experience of comfort will improve Outcome: Progressing

## 2023-05-25 NOTE — ED Triage Notes (Signed)
Pt to ED via [facility] from group home with complaints of SOB. Pt has hx of lung cancer, COPD, and HTN. Per EMS pt has clear L lung sounds and diminished R lung sounds. Pt 91% RA at this time.  Pt has productive cough x 3 days. Pt reports "coughing up green stuff'. Pt denies wearing O2 at home. Pt states he has not slept in 6 days. Pt reports taking trazodone as sleep aid. Pt reports getting chemo and radiation treatment for cancer weekly.  EMS vitals  HR 117 BP 154/93 RR 24 ETCO2 32 Temp 98.5

## 2023-05-25 NOTE — Sepsis Progress Note (Signed)
Sepsis protocol monitored by eLink ?

## 2023-05-25 NOTE — Assessment & Plan Note (Addendum)
-   As needed nicotine patch ordered for nicotine craving

## 2023-05-25 NOTE — Assessment & Plan Note (Deleted)
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 Left upper lobe adenocarcinoma, No enough tissue for NGS, will send off liquid biopsy- send off liquid biopsy.  Labs are reviewed and discussed with patient. Proceed with carboplatin AUC 2 and taxol 45mg /m2 today.

## 2023-05-25 NOTE — Assessment & Plan Note (Addendum)
-  Continue home diltiazem 

## 2023-05-26 ENCOUNTER — Ambulatory Visit: Payer: 59

## 2023-05-26 DIAGNOSIS — R8290 Unspecified abnormal findings in urine: Secondary | ICD-10-CM | POA: Diagnosis present

## 2023-05-26 DIAGNOSIS — R6520 Severe sepsis without septic shock: Secondary | ICD-10-CM | POA: Diagnosis not present

## 2023-05-26 DIAGNOSIS — A419 Sepsis, unspecified organism: Secondary | ICD-10-CM | POA: Diagnosis not present

## 2023-05-26 LAB — CBC
HCT: 29.1 % — ABNORMAL LOW (ref 39.0–52.0)
Hemoglobin: 9.7 g/dL — ABNORMAL LOW (ref 13.0–17.0)
MCH: 30.5 pg (ref 26.0–34.0)
MCHC: 33.3 g/dL (ref 30.0–36.0)
MCV: 91.5 fL (ref 80.0–100.0)
Platelets: 152 10*3/uL (ref 150–400)
RBC: 3.18 MIL/uL — ABNORMAL LOW (ref 4.22–5.81)
RDW: 15.8 % — ABNORMAL HIGH (ref 11.5–15.5)
WBC: 4.4 10*3/uL (ref 4.0–10.5)
nRBC: 0.5 % — ABNORMAL HIGH (ref 0.0–0.2)

## 2023-05-26 LAB — URINE CULTURE: Culture: NO GROWTH

## 2023-05-26 LAB — BASIC METABOLIC PANEL
Anion gap: 11 (ref 5–15)
BUN: 10 mg/dL (ref 8–23)
CO2: 23 mmol/L (ref 22–32)
Calcium: 9.4 mg/dL (ref 8.9–10.3)
Chloride: 102 mmol/L (ref 98–111)
Creatinine, Ser: 0.66 mg/dL (ref 0.61–1.24)
GFR, Estimated: >60 mL/min (ref >60)
Glucose, Bld: 129 mg/dL — ABNORMAL HIGH (ref 70–99)
Potassium: 4.3 mmol/L (ref 3.5–5.1)
Sodium: 136 mmol/L (ref 135–145)

## 2023-05-26 MED ORDER — IPRATROPIUM-ALBUTEROL 0.5-2.5 (3) MG/3ML IN SOLN: 3.0000 mL | Freq: Four times a day (QID) | RESPIRATORY_TRACT | Status: DC

## 2023-05-26 MED ORDER — SODIUM CHLORIDE 0.9 % IV SOLN
500.0000 mg | INTRAVENOUS | Status: DC
  Administered 2023-05-27: 500 mg via INTRAVENOUS
  Filled 2023-05-26: qty 5

## 2023-05-26 MED ORDER — DM-GUAIFENESIN ER 30-600 MG PO TB12
1.0000 | ORAL_TABLET | Freq: Two times a day (BID) | ORAL | Status: DC | PRN
  Administered 2023-05-26 – 2023-05-28 (×3): 1 via ORAL
  Filled 2023-05-26 (×5): qty 1

## 2023-05-26 MED ORDER — IPRATROPIUM-ALBUTEROL 0.5-2.5 (3) MG/3ML IN SOLN
3.0000 mL | Freq: Three times a day (TID) | RESPIRATORY_TRACT | Status: DC
  Administered 2023-05-26 – 2023-06-05 (×29): 3 mL via RESPIRATORY_TRACT
  Filled 2023-05-26 (×30): qty 3

## 2023-05-26 NOTE — Progress Notes (Addendum)
Progress Note   Patient: [name] [id number]:[id number] DOB: 02/18/1961 DOA: 05/25/2023     1 DOS: the patient was seen and examined on 05/26/2023   Brief hospital course: [name] is a 62 year old male with history of depression, anxiety, CAD, hypertension, hyperlipidemia, who presented to the ED from group home on 05/25/2023 for evaluation of shortness of breath x 3 days, with productive cough. See H&P for full HPI on admission and ED course.  Patient was admitted for COPD with acute exacerbation and severe sepsis due to likely pneumonia versus UTI.  Started on IV steroids, IV antibiotics, nebulizer treatments and supportive / symptomatic care.  Further hospital course and management as outlined below.   Assessment and Plan: * Severe sepsis with acute organ dysfunction (HCC) Likely due to PNA.  UA abnormal but pt denies UTI symptoms. Presented with sepsis criteria of leukopenia, tachypnea, tachycardia, lactic acidosis consistent organ involvement and severe sepsis. --Contiue IV Rocephin & Zithromax --Follow cultures --Monitor fever curve, CBC  COPD exacerbation (HCC) Likely due to RLL pneumonia. --Continue IV steroids --Scheduled and PRN nebulizers --Mucinex --IS & flutter --Supplement O2 if sats < 90% on room air  Abnormal urinalysis Pt denies UTI symptoms On antibiotics for pneumonia including Rocephin  Follow urine culture results  Insomnia Continue home trazodone   Adenocarcinoma of left lung (HCC) No acute issues. Follow up with Oncology as scheduled  Nicotine dependence As needed nicotine patch   Schizophrenia (HCC) Continue home paliperidone   Essential hypertension Continue home diltiazem         Subjective: pt seen this AM.  Reports feeling somewhat better.  Still having cough and requests cough medicine.  Cough is productive.  Denies UTI symptoms.  No other complaints.   Physical Exam: Vitals:   05/26/23 0827 05/26/23 0848 05/26/23 1224  05/26/23 1306  BP: 125/82  123/84   Pulse: 92  95   Resp:   (!) 30   Temp: 98.1 F (36.7 C)  98.6 F (37 C)   TempSrc:   Oral   SpO2: 90% 91% 93% 92%  Weight:      Height:       General exam: awake, alert, no acute distress HEENT: moist mucus membranes, hearing grossly normal  Respiratory system: lung sounds diminished with expiratory wheezes, normal respiratory effort at rest on room air. Cardiovascular system: normal S1/S2, RRR, , no pedal edema.   Gastrointestinal system: soft, NT, ND, no HSM felt, +bowel sounds. Central nervous system: A&O x2+. no gross focal neurologic deficits, normal speech Extremities: moves all, no edema, normal tone Skin: dry, intact, normal temperature, normal color, No rashes, lesions or ulcers Psychiatry: normal mood, congruent affect, judgement and insight appear normal     Data Reviewed:  Notable labs --  Glucose 129 othrewise normal BMP, Hbg 9.7  Family Communication: None present  Disposition: Status is: Inpatient Remains inpatient appropriate because: Remains on IV steroids & antibiotics pending clinical improvement   Planned Discharge Destination:  return to group home    Time spent: 40 minutes  Author: Pennie Banter, DO 05/26/2023 2:02 PM  For on call review [email or web address].

## 2023-05-26 NOTE — TOC Progression Note (Signed)
Transition of Care [facility]) - Progression Note    Patient Details  Name: [name] MRN: [id number] Date of Birth: November 12, 1960  Transition of [facility]) CM/SW Contact  Truddie Hidden, RN Phone Number: 05/26/2023, 2:31 PM  Clinical Narrative:    RA completed  Admitted ION:[id number]  Admitted from: Group home  Pharmacy: [facility]  Current home health/prior home health/DME: none         Expected Discharge Plan and Services                                               Social Determinants of Health (SDOH) Interventions SDOH Screenings   Food Insecurity: No Food Insecurity (03/25/2023)  Housing: Low Risk  (02/03/2023)  Transportation Needs: No Transportation Needs (03/25/2023)  Utilities: Not At Risk (03/25/2023)  Depression (PHQ2-9): Low Risk  (02/03/2023)  Tobacco Use: High Risk (05/25/2023)    Readmission Risk Interventions     No data to display

## 2023-05-26 NOTE — Assessment & Plan Note (Signed)
Pt denies UTI symptoms On antibiotics for pneumonia including Urine culture No growth On Abx for COPD/PNA as above

## 2023-05-26 NOTE — Plan of Care (Signed)
  Problem: Education: Goal: Understanding of post-operative needs will improve Outcome: Progressing Goal: Individualized Educational Video(s) Outcome: Progressing   Problem: Clinical Measurements: Goal: Postoperative complications will be avoided or minimized Outcome: Progressing   Problem: Respiratory: Goal: Will regain and/or maintain adequate ventilation Outcome: Progressing   Problem: Education: Goal: Knowledge of General Education information will improve Description: Including pain rating scale, medication(s)/side effects and non-pharmacologic comfort measures Outcome: Progressing   Problem: Health Behavior/Discharge Planning: Goal: Ability to manage health-related needs will improve Outcome: Progressing   Problem: Clinical Measurements: Goal: Ability to maintain clinical measurements within normal limits will improve Outcome: Progressing Goal: Will remain free from infection Outcome: Progressing Goal: Diagnostic test results will improve Outcome: Progressing Goal: Respiratory complications will improve Outcome: Progressing Goal: Cardiovascular complication will be avoided Outcome: Progressing   Problem: Activity: Goal: Risk for activity intolerance will decrease Outcome: Progressing   Problem: Nutrition: Goal: Adequate nutrition will be maintained Outcome: Progressing   Problem: Coping: Goal: Level of anxiety will decrease Outcome: Progressing   Problem: Elimination: Goal: Will not experience complications related to bowel motility Outcome: Progressing Goal: Will not experience complications related to urinary retention Outcome: Progressing   Problem: Pain Managment: Goal: General experience of comfort will improve Outcome: Progressing   Problem: Safety: Goal: Ability to remain free from injury will improve Outcome: Progressing   Problem: Skin Integrity: Goal: Risk for impaired skin integrity will decrease Outcome: Progressing

## 2023-05-26 NOTE — Plan of Care (Signed)

## 2023-05-26 NOTE — Plan of Care (Signed)
[facility] Tonsillectomy/Adenoidectomy, Postoperative PEDS care plan entered in error.

## 2023-05-27 DIAGNOSIS — A419 Sepsis, unspecified organism: Secondary | ICD-10-CM | POA: Diagnosis not present

## 2023-05-27 DIAGNOSIS — R6520 Severe sepsis without septic shock: Secondary | ICD-10-CM | POA: Diagnosis not present

## 2023-05-27 LAB — CBC
HCT: 26.8 % — ABNORMAL LOW (ref 39.0–52.0)
Hemoglobin: 9.1 g/dL — ABNORMAL LOW (ref 13.0–17.0)
MCH: 31 pg (ref 26.0–34.0)
MCHC: 34 g/dL (ref 30.0–36.0)
MCV: 91.2 fL (ref 80.0–100.0)
Platelets: 152 10*3/uL (ref 150–400)
RBC: 2.94 MIL/uL — ABNORMAL LOW (ref 4.22–5.81)
RDW: 16 % — ABNORMAL HIGH (ref 11.5–15.5)
WBC: 6.6 10*3/uL (ref 4.0–10.5)
nRBC: 0 % (ref 0.0–0.2)

## 2023-05-27 MED ORDER — CEFUROXIME AXETIL 500 MG PO TABS
500.0000 mg | ORAL_TABLET | Freq: Two times a day (BID) | ORAL | Status: AC
  Administered 2023-05-28 – 2023-05-29 (×4): 500 mg via ORAL
  Filled 2023-05-27 (×4): qty 1

## 2023-05-27 MED ORDER — AZITHROMYCIN 250 MG PO TABS
500.0000 mg | ORAL_TABLET | Freq: Every day | ORAL | Status: AC
  Administered 2023-05-28 – 2023-05-29 (×2): 500 mg via ORAL
  Filled 2023-05-27 (×2): qty 2

## 2023-05-27 NOTE — Plan of Care (Signed)

## 2023-05-27 NOTE — TOC Progression Note (Signed)
Transition of Care [facility]) - Progression Note    Patient Details  Name: [name] MRN: [id number] Date of Birth: 1960/09/20  [facility] CM/SW Contact  Bing Quarry, RN Phone Number: 05/27/2023, 3:56 PM  Clinical Narrative:  10/19: Contact for GH, [employer], from patient. [phone]. Contacted Mylette (Sp?) and will contact family re transportation when ready for discharge as only one staff member on weekends. Can transport Monday.   Gabriel Cirri MSN RN CM  Transitions of Care Department [facility] [phone] Weekends Only     Expected Discharge Plan: Group Home Barriers to Discharge: Continued Medical Work up  Expected Discharge Plan and Services       Living arrangements for the past 2 months: Group Home                                       Social Determinants of Health (SDOH) Interventions SDOH Screenings   Food Insecurity: No Food Insecurity (03/25/2023)  Housing: Low Risk  (02/03/2023)  Transportation Needs: No Transportation Needs (03/25/2023)  Utilities: Not At Risk (03/25/2023)  Depression (PHQ2-9): Low Risk  (02/03/2023)  Tobacco Use: High Risk (05/25/2023)    Readmission Risk Interventions    05/26/2023    2:38 PM  Readmission Risk Prevention Plan  Transportation Screening Complete  PCP or Specialist Appt within 3-5 Days Complete  HRI or Home Care Consult Complete  Social Work Consult for Recovery Care Planning/Counseling Complete  Palliative Care Screening Not Applicable  Medication Review Oceanographer) Complete

## 2023-05-27 NOTE — Progress Notes (Signed)
PHARMACIST - PHYSICIAN COMMUNICATION  CONCERNING: Antibiotic IV to Oral Route Change Policy  RECOMMENDATION: This patient is receiving azithromycin by the intravenous route.  Based on criteria approved by the Pharmacy and Therapeutics Committee, the antibiotic(s) is/are being converted to the equivalent oral dose form(s).  DESCRIPTION: These criteria include: Patient being treated for a respiratory tract infection, urinary tract infection, cellulitis or clostridium difficile associated diarrhea if on metronidazole The patient is not neutropenic and does not exhibit a GI malabsorption state The patient is eating (either orally or via tube) and/or has been taking other orally administered medications for a least 24 hours The patient is improving clinically and has a Tmax < 100.5  If you have questions about this conversion, please contact the Pharmacy Department   Tressie Ellis 05/27/23

## 2023-05-27 NOTE — Progress Notes (Signed)
Progress Note   Patient: [name] PPI:[id number] DOB: 1960-08-11 DOA: 05/25/2023     2 DOS: the patient was seen and examined on 05/27/2023   Brief hospital course: Mr. Harvard Quattrochi is a 62 year old male with history of depression, anxiety, CAD, hypertension, hyperlipidemia, who presented to the ED from group home on 05/25/2023 for evaluation of shortness of breath x 3 days, with productive cough. See H&P for full HPI on admission and ED course.  Patient was admitted for COPD with acute exacerbation and severe sepsis due to likely pneumonia versus UTI.  Started on IV steroids, IV antibiotics, nebulizer treatments and supportive / symptomatic care.  Further hospital course and management as outlined below.   Assessment and Plan: * Severe sepsis with acute organ dysfunction (HCC) Likely due to PNA.  UA abnormal but pt denies UTI symptoms. Presented with sepsis criteria of leukopenia, tachypnea, tachycardia, lactic acidosis consistent organ involvement and severe sepsis. --Initially on IV Rocephin & Zithromax --Now on PO Ceftin, PO Zithromax --Follow cultures --Monitor fever curve, CBC  COPD exacerbation (HCC) Likely due to RLL pneumonia. --Continue IV steroids --Scheduled and PRN nebulizers --Mucinex --IS & flutter --Supplement O2 if sats < 90% on room air  Abnormal urinalysis Pt denies UTI symptoms On antibiotics for pneumonia including Urine culture No growth On Abx for COPD/PNA as above  Insomnia Continue home trazodone   Adenocarcinoma of left lung (HCC) No acute issues. Follow up with Oncology as scheduled  Nicotine dependence As needed nicotine patch   Schizophrenia (HCC) Continue home paliperidone   Essential hypertension Continue home diltiazem         Subjective: Pt seated edge of bed this AM.  Still having cough. Denies shortness of breath at rest.  No other complaints   Physical Exam: Vitals:   05/27/23 0729 05/27/23 1124 05/27/23 1533  05/27/23 1949  BP: (!) 155/87 127/78 126/82 (!) 143/90  Pulse: 95 97 89 93  Resp: (!) 24 (!) 22 (!) 22 20  Temp: 97.7 F (36.5 C) 98 F (36.7 C) 97.7 F (36.5 C) 98.4 F (36.9 C)  TempSrc:    Oral  SpO2: 92% 91% 94% 96%  Weight:      Height:       General exam: awake, alert, no acute distress HEENT: moist mucus membranes, hearing grossly normal  Respiratory system: ongoing diffuse wheezes, frequent productive sounding cough, lung sounds diminished throughout, normal respiratory effort at rest on room air. Cardiovascular system: normal S1/S2, RRR, , no pedal edema.   Gastrointestinal system: soft, NT, ND\ Central nervous system: A&O x2+. no gross focal neurologic deficits, normal speech Extremities: moves all, no edema, normal tone Skin: dry, intact, normal temperature Psychiatry: normal mood, congruent affect, judgement and insight appear normal     Data Reviewed:  Notable labs --  Glucose 129 othrewise normal BMP, Hbg 9.7  Family Communication: None present  Disposition: Status is: Inpatient Remains inpatient appropriate because: needs further clinical improvement in COPD exac, ongoing diffuse wheezing.  Group home may not be able to take back until Monday 10/21   Planned Discharge Destination:  return to group home    Time spent: 36 minutes  Author: Pennie Banter, DO 05/27/2023 8:18 PM  For on call review [email or web address].

## 2023-05-28 ENCOUNTER — Inpatient Hospital Stay: Payer: 59

## 2023-05-28 DIAGNOSIS — R6520 Severe sepsis without septic shock: Secondary | ICD-10-CM | POA: Diagnosis not present

## 2023-05-28 DIAGNOSIS — A419 Sepsis, unspecified organism: Secondary | ICD-10-CM | POA: Diagnosis not present

## 2023-05-28 MED ORDER — DM-GUAIFENESIN ER 30-600 MG PO TB12
1.0000 | ORAL_TABLET | Freq: Two times a day (BID) | ORAL | Status: DC
  Administered 2023-05-28 – 2023-06-05 (×15): 1 via ORAL
  Filled 2023-05-28 (×18): qty 1

## 2023-05-28 MED ORDER — GUAIFENESIN-DM 100-10 MG/5ML PO SYRP
5.0000 mL | ORAL_SOLUTION | ORAL | Status: DC | PRN
  Administered 2023-05-29 – 2023-05-30 (×2): 5 mL via ORAL
  Filled 2023-05-28 (×2): qty 10

## 2023-05-28 MED ORDER — ALBUTEROL SULFATE (2.5 MG/3ML) 0.083% IN NEBU
2.5000 mg | INHALATION_SOLUTION | RESPIRATORY_TRACT | Status: DC | PRN
  Administered 2023-05-30: 2.5 mg via RESPIRATORY_TRACT
  Filled 2023-05-28: qty 3

## 2023-05-28 MED ORDER — PREDNISONE 50 MG PO TABS
50.0000 mg | ORAL_TABLET | Freq: Every day | ORAL | Status: DC
  Administered 2023-05-28 – 2023-05-30 (×3): 50 mg via ORAL
  Filled 2023-05-28 (×3): qty 1

## 2023-05-28 NOTE — Plan of Care (Signed)

## 2023-05-28 NOTE — Progress Notes (Signed)
Progress Note   Patient: [name] HKV:[id number] DOB: Apr 23, 1961 DOA: 05/25/2023     3 DOS: the patient was seen and examined on 05/28/2023   Brief hospital course: [name] is a 62 year old male with history of depression, anxiety, CAD, hypertension, hyperlipidemia, who presented to the ED from group home on 05/25/2023 for evaluation of shortness of breath x 3 days, with productive cough. See H&P for full HPI on admission and ED course.  Patient was admitted for COPD with acute exacerbation and severe sepsis due to likely pneumonia versus UTI.  Started on IV steroids, IV antibiotics, nebulizer treatments and supportive / symptomatic care.  Further hospital course and management as outlined below.   Assessment and Plan: * Severe sepsis with acute organ dysfunction (HCC) Likely due to PNA.  UA abnormal but pt denies UTI symptoms. Presented with sepsis criteria of leukopenia, tachypnea, tachycardia, lactic acidosis consistent organ involvement and severe sepsis. --Initially on IV Rocephin & Zithromax --Now on PO Ceftin, PO Zithromax --Follow cultures --Monitor fever curve, CBC  COPD exacerbation (HCC) Likely due to RLL pneumonia. --Initially on IV steroids >> Prednisone 50 mg daily --Scheduled and PRN nebulizers --Mucinex --IS & flutter --Supplement O2 if sats < 90% on room air --Incruse and Breo  Abnormal urinalysis Pt denies UTI symptoms On antibiotics for pneumonia including Urine culture No growth On Abx for COPD/PNA as above  Insomnia Continue home trazodone   Adenocarcinoma of left lung (HCC) No acute issues. Follow up with Oncology as scheduled  Nicotine dependence As needed nicotine patch   Schizophrenia (HCC) Continue home paliperidone   Essential hypertension Continue home diltiazem         Subjective: Pt sitting up in bed.  Still has very productive sounding cough, and says he feels shortness of breath still.    Physical Exam: Vitals:    05/28/23 0813 05/28/23 1216 05/28/23 1217 05/28/23 1701  BP: 130/80 123/88 123/88 131/87  Pulse: (!) 101  99 88  Resp: 17 17 17 17   Temp: 98.1 F (36.7 C) 98.2 F (36.8 C) 98.2 F (36.8 C)   TempSrc:      SpO2: 92% 95% 95% 92%  Weight:      Height:       General exam: awake, alert, no acute distress HEENT: moist mucus membranes, hearing grossly normal  Respiratory system: very coarse cough, normal respiratory effort, ongoing wheezes, poor aeration, on room air. Cardiovascular system: normal S1/S2, RRR, , no pedal edema.   Gastrointestinal system: soft, NT, ND\ Central nervous system: A&O x2+. no gross focal neurologic deficits, normal speech Extremities: moves all, no edema, normal tone Skin: dry, intact, normal temperature Psychiatry: normal mood, congruent affect, judgement and insight appear normal     Data Reviewed:  Notable labs --  Hbg 9.1 stable  Family Communication: None present  Disposition: Status is: Inpatient Remains inpatient appropriate because: needs further clinical improvement in COPD exac, ongoing diffuse wheezing and cough.   Group home unable to transport back until Monday 10/21   Planned Discharge Destination:  return to group home    Time spent: 35 minutes  Author: Pennie Banter, DO 05/28/2023 7:48 PM  For on call review [email or web address].

## 2023-05-29 ENCOUNTER — Ambulatory Visit: Payer: 59

## 2023-05-29 DIAGNOSIS — R6520 Severe sepsis without septic shock: Secondary | ICD-10-CM | POA: Diagnosis not present

## 2023-05-29 DIAGNOSIS — A419 Sepsis, unspecified organism: Secondary | ICD-10-CM | POA: Diagnosis not present

## 2023-05-29 MED ORDER — BUDESONIDE 0.25 MG/2ML IN SUSP
0.2500 mg | Freq: Two times a day (BID) | RESPIRATORY_TRACT | Status: DC
  Administered 2023-05-29 – 2023-06-05 (×15): 0.25 mg via RESPIRATORY_TRACT
  Filled 2023-05-29 (×15): qty 2

## 2023-05-29 MED ORDER — INFLUENZA VIRUS VACC SPLIT PF (FLUZONE) 0.5 ML IM SUSY
0.5000 mL | PREFILLED_SYRINGE | INTRAMUSCULAR | Status: DC
  Filled 2023-05-29 (×2): qty 0.5

## 2023-05-29 NOTE — Plan of Care (Signed)
  Problem: Education: Goal: Knowledge of General Education information will improve Description: Including pain rating scale, medication(s)/side effects and non-pharmacologic comfort measures 05/29/2023 1041 by Sherre Scarlet, RN Outcome: Progressing 05/29/2023 1041 by Sherre Scarlet, RN Outcome: Progressing   Problem: Health Behavior/Discharge Planning: Goal: Ability to manage health-related needs will improve 05/29/2023 1041 by Sherre Scarlet, RN Outcome: Progressing 05/29/2023 1041 by Sherre Scarlet, RN Outcome: Progressing   Problem: Clinical Measurements: Goal: Ability to maintain clinical measurements within normal limits will improve 05/29/2023 1041 by Sherre Scarlet, RN Outcome: Progressing 05/29/2023 1041 by Sherre Scarlet, RN Outcome: Progressing Goal: Will remain free from infection 05/29/2023 1041 by Sherre Scarlet, RN Outcome: Progressing 05/29/2023 1041 by Sherre Scarlet, RN Outcome: Progressing Goal: Diagnostic test results will improve 05/29/2023 1041 by Sherre Scarlet, RN Outcome: Progressing 05/29/2023 1041 by Sherre Scarlet, RN Outcome: Progressing Goal: Respiratory complications will improve 05/29/2023 1041 by Sherre Scarlet, RN Outcome: Progressing 05/29/2023 1041 by Sherre Scarlet, RN Outcome: Progressing Goal: Cardiovascular complication will be avoided 05/29/2023 1041 by Sherre Scarlet, RN Outcome: Progressing 05/29/2023 1041 by Sherre Scarlet, RN Outcome: Progressing   Problem: Activity: Goal: Risk for activity intolerance will decrease 05/29/2023 1041 by Sherre Scarlet, RN Outcome: Progressing 05/29/2023 1041 by Sherre Scarlet, RN Outcome: Progressing   Problem: Nutrition: Goal: Adequate nutrition will be maintained 05/29/2023 1041 by Sherre Scarlet, RN Outcome: Progressing 05/29/2023 1041 by Sherre Scarlet, RN Outcome: Progressing   Problem: Coping: Goal: Level of anxiety will decrease 05/29/2023 1041 by Sherre Scarlet, RN Outcome: Progressing 05/29/2023 1041 by Sherre Scarlet, RN Outcome: Progressing   Problem: Elimination: Goal: Will not experience complications related to bowel motility 05/29/2023 1041 by Sherre Scarlet, RN Outcome: Progressing 05/29/2023 1041 by Sherre Scarlet, RN Outcome: Progressing Goal: Will not experience complications related to urinary retention 05/29/2023 1041 by Sherre Scarlet, RN Outcome: Progressing 05/29/2023 1041 by Sherre Scarlet, RN Outcome: Progressing   Problem: Pain Managment: Goal: General experience of comfort will improve 05/29/2023 1041 by Sherre Scarlet, RN Outcome: Progressing 05/29/2023 1041 by Sherre Scarlet, RN Outcome: Progressing   Problem: Safety: Goal: Ability to remain free from injury will improve 05/29/2023 1041 by Sherre Scarlet, RN Outcome: Progressing 05/29/2023 1041 by Sherre Scarlet, RN Outcome: Progressing   Problem: Skin Integrity: Goal: Risk for impaired skin integrity will decrease 05/29/2023 1041 by Sherre Scarlet, RN Outcome: Progressing 05/29/2023 1041 by Sherre Scarlet, RN Outcome: Progressing

## 2023-05-29 NOTE — Evaluation (Signed)
Physical Therapy Evaluation Patient Details Name: DAKWAN REDONDO MRN: [id number] DOB: 25-Jul-1961 Today's Date: 05/29/2023  History of Present Illness  presented to ER Secondary to SOB; admitted for management of sepsis related to ?UTI vs R LL PNA?  Clinical Impression  Patient resting in bed upon arrival to room; alert and oriented to basic information, follows commands, pleasant and cooperative throughout session.  Does demonstrate mild delay in processing and task initiation, but responds well to cuing/encouragement.  Denies pain.  Bilat UE/LE strength/ROM grossly symmetrical and WFL; no focal weakness appreciated.  Currently able to complete bed mobility with mod indep; sit/stand, basic transfers and gait (75') without assist device, cga/min assist; demonstrates reciprocal stepping pattern with decreased heel strike/toe off bilat; limited trunk rotation and arm swing; decreased cadence. Decreased higher-level balance reactions, mild sway/veering with dynamic components. Did trial additional 125' with RW, cga/min assist--minimal change in gait performance with use of RW, slight increase in veering/lateral LOB with use of RW (min assist to correct).  Feel like walker is slight safety risk to patient at current time; will continue additional training/gait efforts without assist device in current state Of note, mild/mod SOB with exertional activities; BORG 9/10 after gait distance, sats >91% on RA. Would benefit from skilled PT to address above deficits and promote optimal return to PLOF.; recommend post-acute PT follow up as indicated by interdisciplinary care team.          If plan is discharge home, recommend the following: A little help with walking and/or transfers;A little help with bathing/dressing/bathroom   Can travel by private vehicle        Equipment Recommendations    Recommendations for Other Services       Functional Status Assessment Patient has had a recent decline in their  functional status and demonstrates the ability to make significant improvements in function in a reasonable and predictable amount of time.     Precautions / Restrictions Precautions Precautions: Fall Restrictions Weight Bearing Restrictions: No      Mobility  Bed Mobility Overal bed mobility: Modified Independent                  Transfers Overall transfer level: Needs assistance Equipment used: Rolling walker (2 wheels) Transfers: Sit to/from Stand Sit to Stand: Min assist           General transfer comment: sit/stand performed with and without assist device, cga for safety; does require bilat UE support to assist with lift off from seating surfaces    Ambulation/Gait Ambulation/Gait assistance: Min assist Gait Distance (Feet): 75 Feet Assistive device: None         General Gait Details: reciprocal stepping pattern with decreased heel strike/toe off bilat; limited trunk rotation and arm swing; decreased cadence.  Decreased higher-level balance reactions, mild sway/veering with dynamic components  Stairs            Wheelchair Mobility     Tilt Bed    Modified Rankin (Stroke Patients Only)       Balance Overall balance assessment: Needs assistance Sitting-balance support: No upper extremity supported, Feet supported Sitting balance-Leahy Scale: Good     Standing balance support: No upper extremity supported Standing balance-Leahy Scale: Fair                               Pertinent Vitals/Pain Pain Assessment Pain Assessment: No/denies pain    Home Living Family/patient expects to be discharged  to:: Group home Living Arrangements: Group Home   Type of Home: House Home Access: Stairs to enter   Entrance Stairs-Number of Steps: 4   Home Layout: One level        Prior Function Prior Level of Function : Independent/Modified Independent             Mobility Comments: Indep with ADls, household and community  mobilization without assist device; no home O2; denies fall history.       Extremity/Trunk Assessment   Upper Extremity Assessment Upper Extremity Assessment: Overall WFL for tasks assessed    Lower Extremity Assessment Lower Extremity Assessment: Overall WFL for tasks assessed (grossly at least 4/5 throughout)       Communication   Communication Communication: No apparent difficulties  Cognition Arousal: Alert Behavior During Therapy: WFL for tasks assessed/performed Overall Cognitive Status: No family/caregiver present to determine baseline cognitive functioning                                 General Comments: Oriented to self, location and general situation; follows commands; pleasant and cooperative. Mild delay in processing and task initiation        General Comments      Exercises Other Exercises Other Exercises: 125' with RW, cga/min assist--minimal change in gait performance with use of RW, slight increase in veering/lateral LOB with use of RW (min assist to correct).  Feel like walker is slight safety risk to patient at current time; will continue additional training/gait efforts without assist device in current state   Assessment/Plan    PT Assessment Patient needs continued PT services  PT Problem List Decreased activity tolerance;Decreased balance;Decreased mobility;Decreased coordination;Decreased cognition;Decreased knowledge of use of DME;Decreased safety awareness;Decreased knowledge of precautions;Cardiopulmonary status limiting activity       PT Treatment Interventions DME instruction;Gait training;Functional mobility training;Therapeutic activities;Therapeutic exercise;Balance training;Cognitive remediation;Patient/family education    PT Goals (Current goals can be found in the Care Plan section)  Acute Rehab PT Goals Patient Stated Goal: to get back home PT Goal Formulation: With patient Time For Goal Achievement: 06/12/23 Potential  to Achieve Goals: Good    Frequency Min 1X/week     Co-evaluation               AM-PAC PT "6 Clicks" Mobility  Outcome Measure Help needed turning from your back to your side while in a flat bed without using bedrails?: None Help needed moving from lying on your back to sitting on the side of a flat bed without using bedrails?: None Help needed moving to and from a bed to a chair (including a wheelchair)?: A Little Help needed standing up from a chair using your arms (e.g., wheelchair or bedside chair)?: A Little Help needed to walk in hospital room?: A Little Help needed climbing 3-5 steps with a railing? : A Little 6 Click Score: 20    End of Session Equipment Utilized During Treatment: Gait belt Activity Tolerance: Patient tolerated treatment well Patient left: in chair;with call bell/phone within reach;with chair alarm set Nurse Communication: Mobility status PT Visit Diagnosis: Muscle weakness (generalized) (M62.81);Difficulty in walking, not elsewhere classified (R26.2)    Time: 4098-1191 PT Time Calculation (min) (ACUTE ONLY): 16 min   Charges:   PT Evaluation $PT Eval Moderate Complexity: 1 Mod   PT General Charges $$ ACUTE PT VISIT: 1 Visit         Brunilda Eble H. Manson Passey, PT, DPT,  NCS 05/29/23, 10:59 AM [phone]

## 2023-05-29 NOTE — TOC Progression Note (Addendum)
Transition of Care [facility]) - Progression Note    Patient Details  Name: [name] MRN: [id number] Date of Birth: 15-Jun-1961  [facility] of [facility]) CM/SW Contact  Truddie Hidden, RN Phone Number: 05/29/2023, 10:58 AM  Clinical Narrative:    Nelida Meuse from group home @ [phone] to advise of possible discharge today.   Spoke with patient regarding HH PT he is agreeable and does not have a preference of an [facility] agency  Referral sent to [place] from [facility].      Expected Discharge Plan: Group Home Barriers to Discharge: Continued Medical Work up  Expected Discharge Plan and Services       Living arrangements for the past 2 months: Group Home                                       Social Determinants of Health (SDOH) Interventions SDOH Screenings   Food Insecurity: No Food Insecurity (03/25/2023)  Housing: Low Risk  (02/03/2023)  Transportation Needs: No Transportation Needs (03/25/2023)  Utilities: Not At Risk (03/25/2023)  Depression (PHQ2-9): Low Risk  (02/03/2023)  Tobacco Use: High Risk (05/25/2023)    Readmission Risk Interventions    05/26/2023    2:38 PM  Readmission Risk Prevention Plan  Transportation Screening Complete  PCP or Specialist Appt within 3-5 Days Complete  HRI or Home Care Consult Complete  Social Work Consult for Recovery Care Planning/Counseling Complete  Palliative Care Screening Not Applicable  Medication Review Oceanographer) Complete

## 2023-05-29 NOTE — Assessment & Plan Note (Signed)
On CTA chest on admission: New small solid pulmonary nodule of the right upper lobe measuring 4 mm.  --Recommend attention on follow-up per clinical protocol.

## 2023-05-29 NOTE — Progress Notes (Addendum)
Progress Note   Patient: [name] CZY:[id number] DOB: 12-24-1960 DOA: 05/25/2023     4 DOS: the patient was seen and examined on 05/29/2023   Brief hospital course: Admitted on 05/25/2023  [name] is a 62 year old male with history of depression, anxiety, CAD, hypertension, hyperlipidemia, who presented to the ED from group home on 05/25/2023 for evaluation of shortness of breath x 3 days, with productive cough. See H&P for full HPI on admission and ED course.  Patient was admitted for COPD with acute exacerbation and severe sepsis due to likely pneumonia versus UTI.  Started on IV steroids, IV antibiotics, nebulizer treatments and supportive / symptomatic care.  Further hospital course and management as outlined below.  In summary, patient treated with IV antibiotics, steroids, bronchodilators as below.  Transitioned to PO prednisone and antibiotics to complete course.  Overall, pt has remained stable on room air, but slow to improve from COPD exacerbation with ongoing diffuse wheezing and coarse productive cough.  Requested nurses encourage frequent pulmonary hygiene with flutter and IS.     Assessment and Plan: * Severe sepsis with acute organ dysfunction (HCC) Likely due to PNA.  UA abnormal but pt denies UTI symptoms.  Negative Covid / flu / RSV Presented with sepsis criteria of leukopenia, tachypnea, tachycardia, lactic acidosis consistent organ involvement and severe sepsis. --Initially on IV Rocephin & Zithromax --PO Ceftin, PO Zithromax to complete course on 10/21 --Follow cultures - neg to date --Monitor fever curve, CBC  COPD exacerbation (HCC) Likely due to RLL pneumonia. --Initially on IV steroids  --Continue Prednisone 50 mg daily --Scheduled Duonebs and PRN albuterol --Pulmicort nebs --Incruse  --Mucinex --IS & flutter for pulmonary hygiene --Supplement O2 if sats < 90% on room air  Abnormal urinalysis Pt denies UTI symptoms On antibiotics for  pneumonia including Urine culture No growth On Abx for COPD/PNA as above  Insomnia Continue home trazodone   Pulmonary nodule, right On CTA chest on admission: New small solid pulmonary nodule of the right upper lobe measuring 4 mm.  --Recommend attention on follow-up per clinical protocol.  Adenocarcinoma of left lung (HCC) No acute issues. Follow up with Oncology as scheduled  Nicotine dependence As needed nicotine patch   Schizophrenia (HCC) Continue home paliperidone   Essential hypertension Continue home diltiazem         Subjective: Pt sitting up in recliner this AM.  Reports feeling better but still coughing a lot and wheezing.  Feels short of breath with exertion.  Physical Exam: Vitals:   05/29/23 0318 05/29/23 0738 05/29/23 0954 05/29/23 1452  BP: 128/85 125/85  122/84  Pulse: 75 85  92  Resp: 16 18  20   Temp: 97.9 F (36.6 C) 98 F (36.7 C)  97.8 F (36.6 C)  TempSrc: Oral Oral  Oral  SpO2: 94% 96% 91% 94%  Weight:      Height:       General exam: awake, alert, no acute distress HEENT: moist mucus membranes, hearing grossly normal  Respiratory system: severely diminished with diffuse high-pitched expiratory wheezes, on room air, normal respiratory effort. Cardiovascular system: normal S1/S2, RRR, , no pedal edema.   Gastrointestinal system: soft, NT, ND Central nervous system: A&O x2+. no gross focal neurologic deficits, normal speech Extremities: moves all, no edema, normal tone Psychiatry: normal mood, flat affect     Data Reviewed: No new labs today.  Family Communication: None present  Disposition: Status is: Inpatient Remains inpatient appropriate because: ongoing significant wheezing, needs  further clinical improvement.  Anticipate d/c tomorrow 10/22    Planned Discharge Destination:  return to group home    Time spent: 38 minutes  Author: Pennie Banter, DO 05/29/2023 4:42 PM  For on call review [email or web address].

## 2023-05-29 NOTE — Plan of Care (Signed)
  Problem: Education: Goal: Knowledge of General Education information will improve Description Including pain rating scale, medication(s)/side effects and non-pharmacologic comfort measures Outcome: Progressing   

## 2023-05-30 ENCOUNTER — Ambulatory Visit: Payer: 59

## 2023-05-30 ENCOUNTER — Other Ambulatory Visit: Payer: Self-pay

## 2023-05-30 DIAGNOSIS — R6520 Severe sepsis without septic shock: Secondary | ICD-10-CM | POA: Diagnosis not present

## 2023-05-30 DIAGNOSIS — A419 Sepsis, unspecified organism: Secondary | ICD-10-CM | POA: Diagnosis not present

## 2023-05-30 LAB — GLUCOSE, CAPILLARY
Glucose-Capillary: 152 mg/dL — ABNORMAL HIGH (ref 70–99)
Glucose-Capillary: 175 mg/dL — ABNORMAL HIGH (ref 70–99)

## 2023-05-30 LAB — CBC
HCT: 26.7 % — ABNORMAL LOW (ref 39.0–52.0)
Hemoglobin: 9.1 g/dL — ABNORMAL LOW (ref 13.0–17.0)
MCH: 30.8 pg (ref 26.0–34.0)
MCHC: 34.1 g/dL (ref 30.0–36.0)
MCV: 90.5 fL (ref 80.0–100.0)
Platelets: 151 10*3/uL (ref 150–400)
RBC: 2.95 MIL/uL — ABNORMAL LOW (ref 4.22–5.81)
RDW: 15.8 % — ABNORMAL HIGH (ref 11.5–15.5)
WBC: 5.6 10*3/uL (ref 4.0–10.5)
nRBC: 0 % (ref 0.0–0.2)

## 2023-05-30 LAB — BASIC METABOLIC PANEL
Anion gap: 7 (ref 5–15)
BUN: 11 mg/dL (ref 8–23)
CO2: 28 mmol/L (ref 22–32)
Calcium: 8.8 mg/dL — ABNORMAL LOW (ref 8.9–10.3)
Chloride: 103 mmol/L (ref 98–111)
Creatinine, Ser: 0.56 mg/dL — ABNORMAL LOW (ref 0.61–1.24)
GFR, Estimated: >60 mL/min (ref >60)
Glucose, Bld: 139 mg/dL — ABNORMAL HIGH (ref 70–99)
Potassium: 3.4 mmol/L — ABNORMAL LOW (ref 3.5–5.1)
Sodium: 138 mmol/L (ref 135–145)

## 2023-05-30 LAB — CULTURE, BLOOD (ROUTINE X 2)
Culture: NO GROWTH
Culture: NO GROWTH

## 2023-05-30 LAB — RAD ONC ARIA SESSION SUMMARY
Course Elapsed Days: 36
Plan Fractions Treated to Date: 24
Plan Prescribed Dose Per Fraction: 2 Gy
Plan Total Fractions Prescribed: 35
Plan Total Prescribed Dose: 70 Gy
Reference Point Dosage Given to Date: 48 Gy
Reference Point Session Dosage Given: 2 Gy
Session Number: 24

## 2023-05-30 MED ORDER — METHYLPREDNISOLONE SODIUM SUCC 40 MG IJ SOLR
80.0000 mg | INTRAMUSCULAR | Status: DC
  Administered 2023-05-30 – 2023-05-31 (×2): 80 mg via INTRAVENOUS
  Filled 2023-05-30 (×2): qty 2

## 2023-05-30 MED ORDER — SODIUM CHLORIDE 3 % IN NEBU
4.0000 mL | INHALATION_SOLUTION | Freq: Two times a day (BID) | RESPIRATORY_TRACT | Status: AC
  Administered 2023-05-31 – 2023-06-02 (×5): 4 mL via RESPIRATORY_TRACT
  Filled 2023-05-30 (×6): qty 4

## 2023-05-30 MED ORDER — POTASSIUM CHLORIDE CRYS ER 20 MEQ PO TBCR
40.0000 meq | EXTENDED_RELEASE_TABLET | Freq: Once | ORAL | Status: AC
  Administered 2023-05-30: 40 meq via ORAL
  Filled 2023-05-30: qty 2

## 2023-05-30 NOTE — Care Management Important Message (Signed)
Important Message  Patient Details  Name: [name] MRN: [id number] Date of Birth: 26-Oct-1960   Important Message Given:  Yes - Medicare IM     Truddie Hidden, RN 05/30/2023, 1:58 PM

## 2023-05-30 NOTE — Plan of Care (Signed)

## 2023-05-30 NOTE — TOC Progression Note (Signed)
Transition of Care [facility]) - Progression Note    Patient Details  Name: [name] MRN: [id number] Date of Birth: [birth year]  Transition of Care [facility]) CM/SW Contact  Truddie Hidden, RN Phone Number: 05/30/2023, 9:07 AM  Clinical Narrative:    Spoke with patient at bedside. Patient previously agreeable to [facility]. He is refusing HH.    Expected Discharge Plan: Group Home Barriers to Discharge: Continued Medical Work up  Expected Discharge Plan and Services       Living arrangements for the past 2 months: Group Home                                       Social Determinants of Health (SDOH) Interventions SDOH Screenings   Food Insecurity: No Food Insecurity (05/29/2023)  Housing: Low Risk  (05/29/2023)  Transportation Needs: No Transportation Needs (05/29/2023)  Utilities: Not At Risk (05/29/2023)  Depression (PHQ2-9): Low Risk  (02/03/2023)  Tobacco Use: High Risk (05/25/2023)    Readmission Risk Interventions    05/26/2023    2:38 PM  Readmission Risk Prevention Plan  Transportation Screening Complete  PCP or Specialist Appt within 3-5 Days Complete  HRI or Home Care Consult Complete  Social Work Consult for Recovery Care Planning/Counseling Complete  Palliative Care Screening Not Applicable  Medication Review Oceanographer) Complete

## 2023-05-30 NOTE — TOC Transition Note (Addendum)
Transition of Care [facility]) - CM/SW Discharge Note   Patient Details  Name: [name] MRN: [id number] Date of Birth: 06/24/1961  Transition of Care [facility]) CM/SW Contact:  Truddie Hidden, RN Phone Number: 05/30/2023, 11:07 AM   Clinical Narrative:    Spoke with Marlette from [facility] @ [phone]. She was advised patient would likely be discharged home today. She is requesting FL2 and discharge summary be emailed to [email or web address]  1:59 pm Spoke with Marlette from [facility]. Per Marletter patient would have to have HH to return.      Barriers to Discharge: Continued Medical Work up   Patient Goals and CMS Choice      Discharge Placement                         Discharge Plan and Services Additional resources added to the After Visit Summary for                                       Social Determinants of Health (SDOH) Interventions SDOH Screenings   Food Insecurity: No Food Insecurity (05/29/2023)  Housing: Low Risk  (05/29/2023)  Transportation Needs: No Transportation Needs (05/29/2023)  Utilities: Not At Risk (05/29/2023)  Depression (PHQ2-9): Low Risk  (02/03/2023)  Tobacco Use: High Risk (05/25/2023)     Readmission Risk Interventions    05/26/2023    2:38 PM  Readmission Risk Prevention Plan  Transportation Screening Complete  PCP or Specialist Appt within 3-5 Days Complete  HRI or Home Care Consult Complete  Social Work Consult for Recovery Care Planning/Counseling Complete  Palliative Care Screening Not Applicable  Medication Review Oceanographer) Complete

## 2023-05-30 NOTE — Progress Notes (Signed)
Progress Note   Patient: Josph Rimando Rosevear OAC:[id number] DOB: 1961/01/05 DOA: 05/25/2023     5 DOS: the patient was seen and examined on 05/30/2023   Brief hospital course: Admitted on 05/25/2023  Mr. Adaryll Banta is a 62 year old male with history of depression, anxiety, CAD, hypertension, hyperlipidemia, who presented to the ED from group home on 05/25/2023 for evaluation of shortness of breath x 3 days, with productive cough. See H&P for full HPI on admission and ED course.  Patient was admitted for COPD with acute exacerbation and severe sepsis due to likely pneumonia versus UTI.  Started on IV steroids, IV antibiotics, nebulizer treatments and supportive / symptomatic care.  Further hospital course and management as outlined below.  In summary, patient treated with IV antibiotics, steroids, bronchodilators as below.  Transitioned to PO prednisone and antibiotics to complete course.  Overall, pt has remained stable on room air, but slow to improve from COPD exacerbation with ongoing diffuse wheezing and coarse productive cough.  Requested nurses encourage frequent pulmonary hygiene with flutter and IS.     Assessment and Plan: * Severe sepsis with acute organ dysfunction (HCC) Likely due to PNA.  UA abnormal but pt denies UTI symptoms.  Negative Covid / flu / RSV Presented with sepsis criteria of leukopenia, tachypnea, tachycardia, lactic acidosis consistent organ involvement and severe sepsis. --Initially on IV Rocephin & Zithromax --PO Ceftin, PO Zithromax to complete course on 10/21 --Follow cultures - neg to date --Monitor fever curve, CBC  COPD exacerbation (HCC) Likely due to RLL pneumonia. 10/22 -- pt minimally improved thus far despite typical COPD management.  Reviewed case and imaging with ICU Dr. 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 --Consider consult to patient's  pulmonologist, Dr. Karna Christmas tomorrow (versus close outpatient follow up) --Transition back to IV steroids  --Scheduled Duonebs and PRN albuterol --Pulmicort nebs --Incruse  --Mucinex --IS & flutter for pulmonary hygiene --Supplement O2 if sats < 90% on room air  Abnormal urinalysis Pt denies UTI symptoms On antibiotics for pneumonia including Urine culture No growth On Abx for COPD/PNA as above  Insomnia Continue home trazodone   Pulmonary nodule, right On CTA chest on admission: New small solid pulmonary nodule of the right upper lobe measuring 4 mm.  --Recommend attention on follow-up per clinical protocol.  Adenocarcinoma of left lung (HCC) No acute issues. Follow up with Oncology as scheduled  Nicotine dependence As needed nicotine patch   Schizophrenia (HCC) Continue home paliperidone   Essential hypertension Continue home diltiazem         Subjective: Pt awake sitting up in bed. Reports still being very short of breath with cough today.  Feels worse today than yesterday.  No other complaints.   Physical Exam: Vitals:   05/30/23 1247 05/30/23 1310 05/30/23 1558 05/30/23 1618  BP: (!) 141/95  127/85 128/89  Pulse: 100  92 86  Resp: 16  16   Temp: 98.1 F (36.7 C)  97.7 F (36.5 C) 98.3 F (36.8 C)  TempSrc:    Oral  SpO2: 95% 95% 97% 96%  Weight:      Height:       General exam: awake, alert, no acute distress HEENT: moist mucus membranes, hearing grossly normal, dentures loose Respiratory system: remains severely diminished with diffuse high-pitched expiratory wheezes worse on the right than left, on room air, normal respiratory effort.  Ongoing coarse-sounding cough. Cardiovascular system: normal  S1/S2, RRR, , no pedal edema.   Gastrointestinal system: soft, NT, ND Central nervous system: A&O x2+. no gross focal neurologic deficits, normal speech Extremities: moves all, no edema, normal tone Psychiatry: normal mood, flat affect     Data  Reviewed: Notable labs --- K 3.4, glucose 139, Cr 0.58, Ca 8.8, Hbg 9.1 stable   Family Communication: None present  Disposition: Status is: Inpatient Remains inpatient appropriate because: ongoing significant wheezing, needs further clinical improvement.      Planned Discharge Destination:  return to group home    Time spent: 42 minutes   Author: Pennie Banter, DO 05/30/2023 5:24 PM  For on call review [email or web address].

## 2023-05-30 NOTE — Plan of Care (Signed)

## 2023-05-31 ENCOUNTER — Ambulatory Visit: Payer: 59

## 2023-05-31 ENCOUNTER — Other Ambulatory Visit: Payer: Self-pay

## 2023-05-31 DIAGNOSIS — A419 Sepsis, unspecified organism: Secondary | ICD-10-CM | POA: Diagnosis not present

## 2023-05-31 DIAGNOSIS — R6520 Severe sepsis without septic shock: Secondary | ICD-10-CM | POA: Diagnosis not present

## 2023-05-31 LAB — BASIC METABOLIC PANEL
Anion gap: 7 (ref 5–15)
BUN: 12 mg/dL (ref 8–23)
CO2: 26 mmol/L (ref 22–32)
Calcium: 9.2 mg/dL (ref 8.9–10.3)
Chloride: 104 mmol/L (ref 98–111)
Creatinine, Ser: 0.59 mg/dL — ABNORMAL LOW (ref 0.61–1.24)
GFR, Estimated: >60 mL/min (ref >60)
Glucose, Bld: 122 mg/dL — ABNORMAL HIGH (ref 70–99)
Potassium: 4.5 mmol/L (ref 3.5–5.1)
Sodium: 137 mmol/L (ref 135–145)

## 2023-05-31 LAB — RAD ONC ARIA SESSION SUMMARY
Course Elapsed Days: 37
Plan Fractions Treated to Date: 25
Plan Prescribed Dose Per Fraction: 2 Gy
Plan Total Fractions Prescribed: 35
Plan Total Prescribed Dose: 70 Gy
Reference Point Dosage Given to Date: 50 Gy
Reference Point Session Dosage Given: 2 Gy
Session Number: 25

## 2023-05-31 LAB — CBC
HCT: 30 % — ABNORMAL LOW (ref 39.0–52.0)
Hemoglobin: 9.8 g/dL — ABNORMAL LOW (ref 13.0–17.0)
MCH: 30.2 pg (ref 26.0–34.0)
MCHC: 32.7 g/dL (ref 30.0–36.0)
MCV: 92.6 fL (ref 80.0–100.0)
Platelets: 176 10*3/uL (ref 150–400)
RBC: 3.24 MIL/uL — ABNORMAL LOW (ref 4.22–5.81)
RDW: 15.9 % — ABNORMAL HIGH (ref 11.5–15.5)
WBC: 6 10*3/uL (ref 4.0–10.5)
nRBC: 0 % (ref 0.0–0.2)

## 2023-05-31 MED ORDER — PREDNISONE 20 MG PO TABS
30.0000 mg | ORAL_TABLET | Freq: Every day | ORAL | Status: DC
  Administered 2023-06-04 – 2023-06-05 (×2): 30 mg via ORAL
  Filled 2023-05-31 (×2): qty 1

## 2023-05-31 MED ORDER — POTASSIUM CHLORIDE CRYS ER 20 MEQ PO TBCR: 40.0000 meq | EXTENDED_RELEASE_TABLET | Freq: Once | ORAL | Status: DC

## 2023-05-31 MED ORDER — PREDNISONE 10 MG PO TABS: 10.0000 mg | ORAL_TABLET | Freq: Every day | ORAL | Status: DC

## 2023-05-31 MED ORDER — PREDNISONE 20 MG PO TABS: 20.0000 mg | ORAL_TABLET | Freq: Every day | ORAL | Status: DC

## 2023-05-31 MED ORDER — PREDNISONE 20 MG PO TABS
40.0000 mg | ORAL_TABLET | Freq: Every day | ORAL | Status: AC
  Administered 2023-06-01 – 2023-06-03 (×3): 40 mg via ORAL
  Filled 2023-05-31 (×3): qty 2

## 2023-05-31 NOTE — Progress Notes (Signed)
Physical Therapy Treatment Patient Details Name: [name] MRN: [id number] DOB: [birth year] Today's Date: 05/31/2023   History of Present Illness presented to ER Secondary to SOB; admitted for management of sepsis related to ?UTI vs R LL PNA?    PT Comments  Pt alert and oriented to self, date, situation and able to provide similar PLOF information compared to evaluation. He was able to follow all commands, some delayed processing noted. Supine to sit modI, and good sitting balance noted. Sit <> stand with RW and without, somewhat impulsive but no unsteadiness noted with or without the RW. He ambulated ~124ft, half with RW and half without, no noticeable changes in gait quality, quickened gait velocity overall. He was also able to perform a few seated therapeutic exercises. Pt up in recliner at end of session, needs in reach. The patient would benefit from further skilled PT intervention to continue to progress towards goals.    If plan is discharge home, recommend the following: A little help with walking and/or transfers;A little help with bathing/dressing/bathroom   Can travel by private vehicle        Equipment Recommendations  Rolling walker (2 wheels)    Recommendations for Other Services       Precautions / Restrictions Precautions Precautions: Fall Restrictions Weight Bearing Restrictions: No     Mobility  Bed Mobility Overal bed mobility: Modified Independent                  Transfers Overall transfer level: Needs assistance Equipment used: Rolling walker (2 wheels) Transfers: Sit to/from Stand Sit to Stand: Supervision                Ambulation/Gait Ambulation/Gait assistance: Contact guard assist Gait Distance (Feet): 160 Feet Assistive device: Rolling walker (2 wheels), None             Stairs             Wheelchair Mobility     Tilt Bed    Modified Rankin (Stroke Patients Only)       Balance Overall balance  assessment: Needs assistance Sitting-balance support: No upper extremity supported, Feet supported Sitting balance-Leahy Scale: Good       Standing balance-Leahy Scale: Good                              Cognition Arousal: Alert Behavior During Therapy: WFL for tasks assessed/performed Overall Cognitive Status: No family/caregiver present to determine baseline cognitive functioning                                 General Comments: Oriented to self, location and general situation; follows commands; pleasant and cooperative. Mild delay in processing and task initiation        Exercises      General Comments        Pertinent Vitals/Pain Pain Assessment Pain Assessment: No/denies pain    Home Living                          Prior Function            PT Goals (current goals can now be found in the care plan section) Progress towards PT goals: Progressing toward goals    Frequency    Min 1X/week      PT Plan  Co-evaluation              AM-PAC PT "6 Clicks" Mobility   Outcome Measure  Help needed turning from your back to your side while in a flat bed without using bedrails?: None Help needed moving from lying on your back to sitting on the side of a flat bed without using bedrails?: None Help needed moving to and from a bed to a chair (including a wheelchair)?: None Help needed standing up from a chair using your arms (e.g., wheelchair or bedside chair)?: None Help needed to walk in hospital room?: A Little Help needed climbing 3-5 steps with a railing? : A Little 6 Click Score: 22    End of Session Equipment Utilized During Treatment: Gait belt Activity Tolerance: Patient tolerated treatment well Patient left: in chair;with call bell/phone within reach;with chair alarm set Nurse Communication: Mobility status PT Visit Diagnosis: Muscle weakness (generalized) (M62.81);Difficulty in walking, not elsewhere  classified (R26.2)     Time: 1610-9604 PT Time Calculation (min) (ACUTE ONLY): 12 min  Charges:    $Therapeutic Activity: 8-22 mins PT General Charges $$ ACUTE PT VISIT: 1 Visit                     Olga Coaster PT, DPT 3:38 PM,05/31/23

## 2023-05-31 NOTE — Consult Note (Signed)
PULMONOLOGY         Date: 05/31/2023,   MRN# [id number] [name] 07-18-1961     AdmissionWeight: 104.8 kg                 CurrentWeight: 104.8 kg  Referring provider: Dr Joylene Igo   CHIEF COMPLAINT:   Recurrent exacerbation of COPD   HISTORY OF PRESENT ILLNESS   This is a 62 yo M with hx of hiatal hernia, coagulopathy, electrolyte derrangements, Asthma and COPD overlap with chronic hypoxemia, hx of CVA, hx of psychiatric condition with schizophrenia, who lives in group home and has chief complaint of dyspnea and cough x few days. Cough is productive with phlegm on expectoration. He was also noted to have signs and symptoms of UTI and is currently being treated for sepsis related to urinary source.  He is being treated for acute exacerbation of COPD with some concern for possible overlying pneumonia.  PCCM consultation was placed to evaluate complex pulmonary patient with recurrent hospitalizations. 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    PAST MEDICAL HISTORY   Past Medical History:  Diagnosis Date   Acute on chronic respiratory failure with hypoxia (HCC)    Asthma    Coagulation disorder (HCC)    COPD (chronic obstructive pulmonary disease) (HCC)    Depression    History of hiatal hernia    Hypertension    Hypokalemia    Leukocytosis    Lung mass    Pneumonia    Schizophrenia (HCC)    Shortness of breath dyspnea    Stroke [facility])    Umbilical hernia    Ventral hernia      SURGICAL HISTORY   Past Surgical History:  Procedure Laterality Date   COLONOSCOPY WITH PROPOFOL N/A 09/21/2021   Procedure: COLONOSCOPY WITH PROPOFOL;  Surgeon: Regis Bill, MD;  Location: [facility] ENDOSCOPY;  Service: Endoscopy;  Laterality: N/A;   HERNIA REPAIR     INSERTION OF MESH N/A 12/18/2014   Procedure: INSERTION OF MESH;  Surgeon:  Lattie Haw, MD;  Location: [facility] ORS;  Service: General;  Laterality: N/A;   SUPRA-UMBILICAL HERNIA  12/18/2014   Procedure: SUPRA-UMBILICAL HERNIA;  Surgeon: Lattie Haw, MD;  Location: [facility] ORS;  Service: General;;   UMBILICAL HERNIA REPAIR N/A 12/18/2014   Procedure: HERNIA REPAIR UMBILICAL ADULT;  Surgeon: Lattie Haw, MD;  Location: [facility] ORS;  Service: General;  Laterality: N/A;   VIDEO BRONCHOSCOPY WITH ENDOBRONCHIAL ULTRASOUND N/A 03/15/2023   Procedure: VIDEO BRONCHOSCOPY WITH ENDOBRONCHIAL ULTRASOUND;  Surgeon: Vida Rigger, MD;  Location: [facility] ORS;  Service: Thoracic;  Laterality: N/A;     FAMILY HISTORY   Family History  Problem Relation Age of Onset   Asthma Mother    Hypertension Father      SOCIAL HISTORY   Social History   Tobacco Use   Smoking status: Every Day    Current packs/day: 0.15    Average packs/day: 0.2 packs/day for 45.0 years (6.8 ttl pk-yrs)    Types: Cigarettes   Smokeless tobacco: Never  Vaping Use   Vaping status: Never Used  Substance Use Topics   Alcohol use: Not Currently    Alcohol/week: 75.0 standard drinks of alcohol    Types: 75 Cans of beer per week   Drug use: Not Currently    Types: Marijuana, "Crack" cocaine  Comment: none in 15 yrs     MEDICATIONS    Home Medication:    Current Medication:  Current Facility-Administered Medications:    acetaminophen (TYLENOL) tablet 650 mg, 650 mg, Oral, Q6H PRN **OR** acetaminophen (TYLENOL) suppository 650 mg, 650 mg, Rectal, Q6H PRN, Cox, Amy N, DO   albuterol (PROVENTIL) (2.5 MG/3ML) 0.083% nebulizer solution 2.5 mg, 2.5 mg, Nebulization, Q4H PRN, Esaw Grandchild A, DO, 2.5 mg at 05/30/23 1110   aspirin EC tablet 81 mg, 81 mg, Oral, Daily, Cox, Amy N, DO, 81 mg at 05/31/23 0857   budesonide (PULMICORT) nebulizer solution 0.25 mg, 0.25 mg, Nebulization, BID, Esaw Grandchild A, DO, 0.25 mg at 05/31/23 0741   clonazePAM (KLONOPIN) tablet 1 mg, 1 mg, Oral, QHS, Cox, Amy  N, DO, 1 mg at 05/30/23 2114   clopidogrel (PLAVIX) tablet 75 mg, 75 mg, Oral, Daily, Cox, Amy N, DO, 75 mg at 05/31/23 0857   dextromethorphan-guaiFENesin (MUCINEX DM) 30-600 MG per 12 hr tablet 1 tablet, 1 tablet, Oral, BID, Esaw Grandchild A, DO, 1 tablet at 05/31/23 0857   diltiazem (CARDIZEM CD) 24 hr capsule 240 mg, 240 mg, Oral, Daily, Cox, Amy N, DO, 240 mg at 05/31/23 0857   enoxaparin (LOVENOX) injection 50 mg, 50 mg, Subcutaneous, Q24H, Cox, Amy N, DO, 50 mg at 05/30/23 2114   famotidine (PEPCID) tablet 20 mg, 20 mg, Oral, Daily, Cox, Amy N, DO, 20 mg at 05/31/23 0857   guaiFENesin-dextromethorphan (ROBITUSSIN DM) 100-10 MG/5ML syrup 5 mL, 5 mL, Oral, Q4H PRN, Esaw Grandchild A, DO, 5 mL at 05/30/23 1053   influenza vac split trivalent PF (FLULAVAL) injection 0.5 mL, 0.5 mL, Intramuscular, Tomorrow-1000, Griffith, Kelly A, DO   ipratropium-albuterol (DUONEB) 0.5-2.5 (3) MG/3ML nebulizer solution 3 mL, 3 mL, Nebulization, TID, Denton Lank, Kelly A, DO, 3 mL at 05/31/23 1304   mirtazapine (REMERON) tablet 30 mg, 30 mg, Oral, QHS, Cox, Amy N, DO, 30 mg at 05/30/23 2114   montelukast (SINGULAIR) tablet 10 mg, 10 mg, Oral, QHS, Cox, Amy N, DO, 10 mg at 05/30/23 2114   paliperidone (INVEGA) 24 hr tablet 9 mg, 9 mg, Oral, QHS, Cox, Amy N, DO, 9 mg at 05/30/23 2116   pravastatin (PRAVACHOL) tablet 20 mg, 20 mg, Oral, q1800, Cox, Amy N, DO, 20 mg at 05/30/23 1732   [START ON 06/01/2023] predniSONE (DELTASONE) tablet 40 mg, 40 mg, Oral, Q breakfast **FOLLOWED BY** [START ON 06/04/2023] predniSONE (DELTASONE) tablet 30 mg, 30 mg, Oral, Q breakfast **FOLLOWED BY** [START ON 06/07/2023] predniSONE (DELTASONE) tablet 20 mg, 20 mg, Oral, Q breakfast **FOLLOWED BY** [START ON 06/10/2023] predniSONE (DELTASONE) tablet 10 mg, 10 mg, Oral, Q breakfast, [name], [name], MD   senna-docusate (Senokot-S) tablet 1 tablet, 1 tablet, Oral, QHS PRN, Cox, Amy N, DO, 1 tablet at 05/27/23 2103   sodium chloride HYPERTONIC  3 % nebulizer solution 4 mL, 4 mL, Nebulization, BID, Denton Lank, Kelly A, DO, 4 mL at 05/31/23 0741   traZODone (DESYREL) tablet 150 mg, 150 mg, Oral, QHS PRN, Cox, Amy N, DO, 150 mg at 05/30/23 2114   [DISCONTINUED] fluticasone furoate-vilanterol (BREO ELLIPTA) 100-25 MCG/ACT 1 puff, 1 puff, Inhalation, Daily, 1 puff at 05/29/23 0903 **AND** umeclidinium bromide (INCRUSE ELLIPTA) 62.5 MCG/ACT 1 puff, 1 puff, Inhalation, Daily, Cox, Amy N, DO, 1 puff at 05/31/23 0859    ALLERGIES   Patient has no known allergies.     REVIEW OF SYSTEMS    Review of Systems:  Gen:  Denies  fever, sweats,  chills weigh loss  HEENT: Denies blurred vision, double vision, ear pain, eye pain, hearing loss, nose bleeds, sore throat Cardiac:  No dizziness, chest pain or heaviness, chest tightness,edema Resp:   reports dyspnea chronically  Gi: Denies swallowing difficulty, stomach pain, nausea or vomiting, diarrhea, constipation, bowel incontinence Gu:  Denies bladder incontinence, burning urine Ext:   Denies Joint pain, stiffness or swelling Skin: Denies  skin rash, easy bruising or bleeding or hives Endoc:  Denies polyuria, polydipsia , polyphagia or weight change Psych:   Denies depression, insomnia or hallucinations   Other:  All other systems negative   VS: BP (!) 148/85 (BP Location: Left Arm)   Pulse 88   Temp 97.8 F (36.6 C)   Resp 20   Ht 6' (1.829 m)   Wt 104.8 kg   SpO2 97%   BMI 31.33 kg/m      PHYSICAL EXAM    GENERAL:NAD, no fevers, chills, no weakness no fatigue HEAD: Normocephalic, atraumatic.  EYES: Pupils equal, round, reactive to light. Extraocular muscles intact. No scleral icterus.  MOUTH: Moist mucosal membrane. Dentition intact. No abscess noted.  EAR, NOSE, THROAT: Clear without exudates. No external lesions.  NECK: Supple. No thyromegaly. No nodules. No JVD.  PULMONARY: decreased breath sounds with mild rhonchi worse at bases bilaterally.  CARDIOVASCULAR: S1 and  S2. Regular rate and rhythm. No murmurs, rubs, or gallops. No edema. Pedal pulses 2+ bilaterally.  GASTROINTESTINAL: Soft, nontender, nondistended. No masses. Positive bowel sounds. No hepatosplenomegaly.  MUSCULOSKELETAL: No swelling, clubbing, or edema. Range of motion full in all extremities.  NEUROLOGIC: Cranial nerves II through XII are intact. No gross focal neurological deficits. Sensation intact. Reflexes intact.  SKIN: No ulceration, lesions, rashes, or cyanosis. Skin warm and dry. Turgor intact.  PSYCHIATRIC: Mood, affect within normal limits. The patient is awake, alert and oriented x 3. Insight, judgment intact.       IMAGING     ASSESSMENT/PLAN   Advanced COPD with Asthma Overlap syndrome   - patient currently on appropriate COPD care path   - agree with Prednisone PO taper    - continue nebulizer therapy with Duoneb TID   - continue pulmicort 0.25 bid   - continue incruse ellipta one puff daily   - Singulair 10mg  at bedtime  -PT/OT    Bilateral mucus plugging of bronchi   - non invasive modalities - agree with hypertonic saline   - duoneb  - flutter valve    Spiculated 13mm left upper lobe nodule  - patient has been treated Oct 2024 with radiation oncology s/p SBRT   Atelectasis   - incentive spirometry and physical therapy initially        Thank you for allowing me to participate in the care of this patient.   Patient/Family are satisfied with care plan and all questions have been answered.    Provider disclosure: Patient with at least one acute or chronic illness or injury that poses a threat to life or bodily function and is being managed actively during this encounter.  All of the below services have been performed independently by signing provider:  review of prior documentation from internal and or external health records.  Review of previous and current lab results.  Interview and comprehensive assessment during patient visit today. Review of  current and previous chest radiographs/CT scans. Discussion of management and test interpretation with health care team and patient/family.   This document was prepared using Conservation officer, historic buildings and may include  unintentional dictation errors.     Vida Rigger, M.D.  Division of Pulmonary & Critical Care Medicine

## 2023-05-31 NOTE — Progress Notes (Signed)
Progress Note   Patient: [name] QIO:[id number] DOB: August 07, 1961 DOA: 05/25/2023     6 DOS: the patient was seen and examined on 05/31/2023   Brief hospital course:  Mr. Antoni Whitmill is a 62 year old male with history of depression, anxiety, CAD, hypertension, hyperlipidemia, who presented to the ED from group home on 05/25/2023 for evaluation of shortness of breath x 3 days, with productive cough. See H&P for full HPI on admission and ED course.   Patient was admitted for COPD with acute exacerbation and severe sepsis due to likely pneumonia versus UTI.  Started on IV steroids, IV antibiotics, nebulizer treatments and supportive / symptomatic care.   Further hospital course and management as outlined below.   In summary, patient treated with IV antibiotics, steroids, bronchodilators as below.  Transitioned to PO prednisone and antibiotics to complete course.  Overall, pt has remained stable on room air, but slow to improve from COPD exacerbation with ongoing diffuse wheezing and coarse productive cough.   Requested nurses encourage frequent pulmonary hygiene with flutter and IS.      Assessment and Plan:  * Severe sepsis with acute organ dysfunction (HCC) Likely due to PNA.  UA abnormal but pt denies UTI symptoms.  Negative Covid / flu / RSV Presented with sepsis criteria of leukopenia, tachypnea, tachycardia, lactic acidosis consistent organ involvement and severe sepsis. --Initially on IV Rocephin & Zithromax --PO Ceftin, PO Zithromax and completed course  10/21 --Follow cultures - neg to date --Monitor fever curve, CBC    COPD exacerbation (HCC) Likely due to RLL pneumonia. 10/22 -- pt minimally improved thus far despite typical COPD management.  Reviewed case and imaging with ICU Dr. 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 -- Patient has been on IV steroids and will be  transitioning to oral steroids --Scheduled Duonebs and PRN albuterol --Pulmicort nebs --Incruse  --Mucinex --IS & flutter for pulmonary hygiene --Supplement O2 if sats < 90% on room air --Pulmonary consult     Insomnia Continue home trazodone    Pulmonary nodule, right On CTA chest on admission: New small solid pulmonary nodule of the right upper lobe measuring 4 mm.  --Recommend attention on follow-up per clinical protocol.   Adenocarcinoma of left lung (HCC) No acute issues. Follow up with Oncology as scheduled   Nicotine dependence As needed nicotine patch    Schizophrenia (HCC) Continue home paliperidone    Essential hypertension Continue home diltiazem                 Subjective: Patient is seen and examined at the bedside.  Continues to have a cough productive of yellow phlegm.    Physical Exam: Vitals:   05/31/23 0744 05/31/23 0857 05/31/23 1239 05/31/23 1304  BP:  (!) 154/92 (!) 148/85   Pulse:  (!) 104 88   Resp:  18 20   Temp:  97.8 F (36.6 C) 97.8 F (36.6 C)   TempSrc:      SpO2: 96% 98% 99% 97%  Weight:      Height:       General exam: awake, alert, no acute distress HEENT: moist mucus membranes, hearing grossly normal, dentures loose Respiratory system: remains severely diminished with diffuse high-pitched expiratory wheezes worse on the right than left, on room air, normal respiratory effort.  Ongoing coarse-sounding cough. Cardiovascular system: normal S1/S2, RRR, , no pedal edema.   Gastrointestinal system: soft, NT,  ND Central nervous system: A&O x2+. no gross focal neurologic deficits, normal speech Extremities: moves all, no edema, normal tone Psychiatry: normal mood, flat affect    Data Reviewed: Labs reviewed.  Hemoglobin 9.8 There are no new results to review at this time.  Family Communication: Plan of care discussed with patient  Disposition: Status is: Inpatient Remains inpatient appropriate because: Treatment of COPD  exacerbation  Planned Discharge Destination:  TBD    Time spent: 34 minutes  Author: Lucile Shutters, MD 05/31/2023 2:31 PM  For on call review [email or web address].

## 2023-06-01 ENCOUNTER — Ambulatory Visit: Payer: 59

## 2023-06-01 ENCOUNTER — Inpatient Hospital Stay: Payer: 59

## 2023-06-01 ENCOUNTER — Other Ambulatory Visit: Payer: Self-pay

## 2023-06-01 ENCOUNTER — Inpatient Hospital Stay: Payer: 59 | Admitting: Oncology

## 2023-06-01 DIAGNOSIS — A419 Sepsis, unspecified organism: Secondary | ICD-10-CM | POA: Diagnosis not present

## 2023-06-01 DIAGNOSIS — R6520 Severe sepsis without septic shock: Secondary | ICD-10-CM | POA: Diagnosis not present

## 2023-06-01 LAB — RAD ONC ARIA SESSION SUMMARY
Course Elapsed Days: 38
Plan Fractions Treated to Date: 26
Plan Prescribed Dose Per Fraction: 2 Gy
Plan Total Fractions Prescribed: 35
Plan Total Prescribed Dose: 70 Gy
Reference Point Dosage Given to Date: 52 Gy
Reference Point Session Dosage Given: 2 Gy
Session Number: 26

## 2023-06-01 NOTE — Progress Notes (Signed)
Progress Note   Patient: [name] ZOX:[id number] DOB: 22-Dec-1960 DOA: 05/25/2023     7 DOS: the patient was seen and examined on 06/01/2023   Brief hospital course: [name] is a 62 year old male with history of depression, anxiety, CAD, hypertension, hyperlipidemia, who presented to the ED from group home on 05/25/2023 for evaluation of shortness of breath x 3 days, with productive cough. See H&P for full HPI on admission and ED course.   Patient was admitted for COPD with acute exacerbation and severe sepsis due to likely pneumonia versus UTI.  Started on IV steroids, IV antibiotics, nebulizer treatments and supportive / symptomatic care.   Further hospital course and management as outlined below.   In summary, patient treated with IV antibiotics, steroids, bronchodilators as below.  Transitioned to PO prednisone and antibiotics to complete course.  Overall, pt has remained stable on room air, but slow to improve from COPD exacerbation with ongoing diffuse wheezing and coarse productive cough.   Requested nurses encourage frequent pulmonary hygiene with flutter and IS.    Assessment and Plan:  * Severe sepsis with acute organ dysfunction (HCC) Likely due to PNA.  UA abnormal but pt denies UTI symptoms.   Negative Covid / flu / RSV Presented with sepsis criteria of leukopenia, tachypnea, tachycardia, lactic acidosis consistent organ involvement and severe sepsis. --Initially on IV Rocephin & Zithromax --PO Ceftin, PO Zithromax and completed course  10/21 --Follow cultures - neg to date --Monitor fever curve, CBC     COPD exacerbation (HCC) Likely due to RLL pneumonia. 10/22 -- pt minimally improved thus far despite typical COPD management.  Reviewed case and imaging with ICU Dr. 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 -- Patient was on IV steroids and has been  transitioned to prednisone taper --Scheduled Duonebs and PRN albuterol --Pulmicort nebs --Incruse  --Mucinex --IS & flutter for pulmonary hygiene --Supplement O2 if sats < 90% on room air --Pulmonary consult     Insomnia Continue home trazodone    Pulmonary nodule, right On CTA chest on admission: New small solid pulmonary nodule of the right upper lobe measuring 4 mm.  --Recommend attention on follow-up per clinical protocol.   Adenocarcinoma of left lung (HCC) No acute issues. Patient is status post SBRT in Oct, 2024  Follow up with Oncology as scheduled   Nicotine dependence As needed nicotine patch    Schizophrenia (HCC) Continue home paliperidone    Essential hypertension Continue home diltiazem              Subjective: Patient is seen and examined at the bedside.  Physical Exam: Vitals:   05/31/23 2345 06/01/23 0354 06/01/23 0929 06/01/23 1215  BP: 118/77 (!) 106/90 (!) 138/100 138/62  Pulse: 80 73 87 96  Resp: 18 19 18 20   Temp: 98.2 F (36.8 C) 97.7 F (36.5 C) 97.8 F (36.6 C) 99.6 F (37.6 C)  TempSrc:   Oral Oral  SpO2: 96% 97% 95% 93%  Weight:      Height:       General exam: awake, alert, no acute distress HEENT: moist mucus membranes, hearing grossly normal, dentures loose Respiratory system: remains severely diminished with diffuse high-pitched expiratory wheezes worse on the right than left, on room air, normal respiratory effort.  Ongoing coarse-sounding cough. Cardiovascular system: normal S1/S2, RRR, , no pedal edema.   Gastrointestinal system: soft, NT, ND Central nervous  system: A&O x2+. no gross focal neurologic deficits, normal speech Extremities: moves all, no edema, normal tone Psychiatry: normal mood, flat affect   Data Reviewed:  There are no new results to review at this time.  Family Communication: Plan of care discussed with patient in detail.  Disposition: Status is: Inpatient Remains inpatient appropriate  because: Discharge planning  Planned Discharge Destination:  TBD    Time spent: 33 minutes  Author: Lucile Shutters, MD 06/01/2023 3:38 PM  For on call review [email or web address].

## 2023-06-01 NOTE — Progress Notes (Signed)
PULMONOLOGY         Date: 06/01/2023,   MRN# [id number] [name] [birth year]     AdmissionWeight: 104.8 kg                 CurrentWeight: 104.8 kg  Referring provider: Dr Joylene Igo   CHIEF COMPLAINT:   Recurrent exacerbation of COPD   HISTORY OF PRESENT ILLNESS   This is a 62 yo M with hx of hiatal hernia, coagulopathy, electrolyte derrangements, Asthma and COPD overlap with chronic hypoxemia, hx of CVA, hx of psychiatric condition with schizophrenia, who lives in group home and has chief complaint of dyspnea and cough x few days. Cough is productive with phlegm on expectoration. He was also noted to have signs and symptoms of UTI and is currently being treated for sepsis related to urinary source.  He is being treated for acute exacerbation of COPD with some concern for possible overlying pneumonia.  PCCM consultation was placed to evaluate complex pulmonary patient with recurrent hospitalizations. 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    06/01/23- patient is stable with no acute events overnight.    PAST MEDICAL HISTORY   Past Medical History:  Diagnosis Date   Acute on chronic respiratory failure with hypoxia (HCC)    Asthma    Coagulation disorder (HCC)    COPD (chronic obstructive pulmonary disease) (HCC)    Depression    History of hiatal hernia    Hypertension    Hypokalemia    Leukocytosis    Lung mass    Pneumonia    Schizophrenia (HCC)    Shortness of breath dyspnea    Stroke [facility])    Umbilical hernia    Ventral hernia      SURGICAL HISTORY   Past Surgical History:  Procedure Laterality Date   COLONOSCOPY WITH PROPOFOL N/A 09/21/2021   Procedure: COLONOSCOPY WITH PROPOFOL;  Surgeon: Regis Bill, MD;  Location: [facility] ENDOSCOPY;  Service: Endoscopy;  Laterality: N/A;   HERNIA REPAIR     INSERTION  OF MESH N/A 12/18/2014   Procedure: INSERTION OF MESH;  Surgeon: Lattie Haw, MD;  Location: [facility] ORS;  Service: General;  Laterality: N/A;   SUPRA-UMBILICAL HERNIA  12/18/2014   Procedure: SUPRA-UMBILICAL HERNIA;  Surgeon: Lattie Haw, MD;  Location: [facility] ORS;  Service: General;;   UMBILICAL HERNIA REPAIR N/A 12/18/2014   Procedure: HERNIA REPAIR UMBILICAL ADULT;  Surgeon: Lattie Haw, MD;  Location: [facility] ORS;  Service: General;  Laterality: N/A;   VIDEO BRONCHOSCOPY WITH ENDOBRONCHIAL ULTRASOUND N/A 03/15/2023   Procedure: VIDEO BRONCHOSCOPY WITH ENDOBRONCHIAL ULTRASOUND;  Surgeon: Vida Rigger, MD;  Location: [facility] ORS;  Service: Thoracic;  Laterality: N/A;     FAMILY HISTORY   Family History  Problem Relation Age of Onset   Asthma Mother    Hypertension Father      SOCIAL HISTORY   Social History   Tobacco Use   Smoking status: Every Day    Current packs/day: 0.15    Average packs/day: 0.2 packs/day for 45.0 years (6.8 ttl pk-yrs)    Types: Cigarettes   Smokeless tobacco: Never  Vaping Use   Vaping status: Never Used  Substance Use Topics   Alcohol use: Not Currently    Alcohol/week: 75.0 standard drinks of alcohol    Types: 75 Cans of beer per week   Drug use:  Not Currently    Types: Marijuana, "Crack" cocaine    Comment: none in 15 yrs     MEDICATIONS    Home Medication:    Current Medication:  Current Facility-Administered Medications:    acetaminophen (TYLENOL) tablet 650 mg, 650 mg, Oral, Q6H PRN **OR** acetaminophen (TYLENOL) suppository 650 mg, 650 mg, Rectal, Q6H PRN, Cox, Amy N, DO   aspirin EC tablet 81 mg, 81 mg, Oral, Daily, Cox, Amy N, DO, 81 mg at 06/01/23 0915   budesonide (PULMICORT) nebulizer solution 0.25 mg, 0.25 mg, Nebulization, BID, Esaw Grandchild A, DO, 0.25 mg at 06/01/23 4696   clonazePAM (KLONOPIN) tablet 1 mg, 1 mg, Oral, QHS, Cox, Amy N, DO, 1 mg at 05/31/23 2138   clopidogrel (PLAVIX) tablet 75 mg, 75 mg, Oral, Daily,  Cox, Amy N, DO, 75 mg at 06/01/23 0916   dextromethorphan-guaiFENesin (MUCINEX DM) 30-600 MG per 12 hr tablet 1 tablet, 1 tablet, Oral, BID, Esaw Grandchild A, DO, 1 tablet at 06/01/23 0916   diltiazem (CARDIZEM CD) 24 hr capsule 240 mg, 240 mg, Oral, Daily, Cox, Amy N, DO, 240 mg at 06/01/23 0916   enoxaparin (LOVENOX) injection 50 mg, 50 mg, Subcutaneous, Q24H, Cox, Amy N, DO, 50 mg at 05/31/23 2139   famotidine (PEPCID) tablet 20 mg, 20 mg, Oral, Daily, Cox, Amy N, DO, 20 mg at 06/01/23 0915   guaiFENesin-dextromethorphan (ROBITUSSIN DM) 100-10 MG/5ML syrup 5 mL, 5 mL, Oral, Q4H PRN, Esaw Grandchild A, DO, 5 mL at 05/30/23 1053   influenza vac split trivalent PF (FLULAVAL) injection 0.5 mL, 0.5 mL, Intramuscular, Tomorrow-1000, Griffith, Kelly A, DO   ipratropium-albuterol (DUONEB) 0.5-2.5 (3) MG/3ML nebulizer solution 3 mL, 3 mL, Nebulization, TID, Denton Lank, Kelly A, DO, 3 mL at 06/01/23 0811   mirtazapine (REMERON) tablet 30 mg, 30 mg, Oral, QHS, Cox, Amy N, DO, 30 mg at 05/31/23 2137   montelukast (SINGULAIR) tablet 10 mg, 10 mg, Oral, QHS, Cox, Amy N, DO, 10 mg at 05/31/23 2137   paliperidone (INVEGA) 24 hr tablet 9 mg, 9 mg, Oral, QHS, Cox, Amy N, DO, 9 mg at 05/31/23 2138   pravastatin (PRAVACHOL) tablet 20 mg, 20 mg, Oral, q1800, Cox, Amy N, DO, 20 mg at 05/31/23 1643   predniSONE (DELTASONE) tablet 40 mg, 40 mg, Oral, Q breakfast, 40 mg at 06/01/23 0915 **FOLLOWED BY** [START ON 06/04/2023] predniSONE (DELTASONE) tablet 30 mg, 30 mg, Oral, Q breakfast **FOLLOWED BY** [START ON 06/07/2023] predniSONE (DELTASONE) tablet 20 mg, 20 mg, Oral, Q breakfast **FOLLOWED BY** [START ON 06/10/2023] predniSONE (DELTASONE) tablet 10 mg, 10 mg, Oral, Q breakfast, [name], [name], MD   senna-docusate (Senokot-S) tablet 1 tablet, 1 tablet, Oral, QHS PRN, Cox, Amy N, DO, 1 tablet at 05/27/23 2103   sodium chloride HYPERTONIC 3 % nebulizer solution 4 mL, 4 mL, Nebulization, BID, Denton Lank, Kelly A, DO, 4 mL at  06/01/23 2952   traZODone (DESYREL) tablet 150 mg, 150 mg, Oral, QHS PRN, Cox, Amy N, DO, 150 mg at 05/31/23 2137   [DISCONTINUED] fluticasone furoate-vilanterol (BREO ELLIPTA) 100-25 MCG/ACT 1 puff, 1 puff, Inhalation, Daily, 1 puff at 05/29/23 0903 **AND** umeclidinium bromide (INCRUSE ELLIPTA) 62.5 MCG/ACT 1 puff, 1 puff, Inhalation, Daily, Cox, Amy N, DO, 1 puff at 06/01/23 0916    ALLERGIES   Patient has no known allergies.     REVIEW OF SYSTEMS    Review of Systems:  Gen:  Denies  fever, sweats, chills weigh loss  HEENT: Denies blurred vision, double vision, ear  pain, eye pain, hearing loss, nose bleeds, sore throat Cardiac:  No dizziness, chest pain or heaviness, chest tightness,edema Resp:   reports dyspnea chronically  Gi: Denies swallowing difficulty, stomach pain, nausea or vomiting, diarrhea, constipation, bowel incontinence Gu:  Denies bladder incontinence, burning urine Ext:   Denies Joint pain, stiffness or swelling Skin: Denies  skin rash, easy bruising or bleeding or hives Endoc:  Denies polyuria, polydipsia , polyphagia or weight change Psych:   Denies depression, insomnia or hallucinations   Other:  All other systems negative   VS: BP (!) 138/100 (BP Location: Right Arm)   Pulse 87   Temp 97.8 F (36.6 C) (Oral)   Resp 18   Ht 6' (1.829 m)   Wt 104.8 kg   SpO2 95%   BMI 31.33 kg/m      PHYSICAL EXAM    GENERAL:NAD, no fevers, chills, no weakness no fatigue HEAD: Normocephalic, atraumatic.  EYES: Pupils equal, round, reactive to light. Extraocular muscles intact. No scleral icterus.  MOUTH: Moist mucosal membrane. Dentition intact. No abscess noted.  EAR, NOSE, THROAT: Clear without exudates. No external lesions.  NECK: Supple. No thyromegaly. No nodules. No JVD.  PULMONARY: decreased breath sounds with mild rhonchi worse at bases bilaterally.  CARDIOVASCULAR: S1 and S2. Regular rate and rhythm. No murmurs, rubs, or gallops. No edema. Pedal  pulses 2+ bilaterally.  GASTROINTESTINAL: Soft, nontender, nondistended. No masses. Positive bowel sounds. No hepatosplenomegaly.  MUSCULOSKELETAL: No swelling, clubbing, or edema. Range of motion full in all extremities.  NEUROLOGIC: Cranial nerves II through XII are intact. No gross focal neurological deficits. Sensation intact. Reflexes intact.  SKIN: No ulceration, lesions, rashes, or cyanosis. Skin warm and dry. Turgor intact.  PSYCHIATRIC: Mood, affect within normal limits. The patient is awake, alert and oriented x 3. Insight, judgment intact.       IMAGING     ASSESSMENT/PLAN   Advanced COPD with Asthma Overlap syndrome   - patient currently on appropriate COPD care path   - agree with Prednisone PO taper    - continue nebulizer therapy with Duoneb TID   - continue pulmicort 0.25 bid   - continue incruse ellipta one puff daily   - Singulair 10mg  at bedtime  -PT/OT    Bilateral mucus plugging of bronchi   - non invasive modalities - agree with hypertonic saline   - duoneb  - flutter valve    Spiculated 13mm left upper lobe nodule  - patient has been treated Oct 2024 with radiation oncology s/p SBRT   Atelectasis   - incentive spirometry and physical therapy initially        Thank you for allowing me to participate in the care of this patient.   Patient/Family are satisfied with care plan and all questions have been answered.    Provider disclosure: Patient with at least one acute or chronic illness or injury that poses a threat to life or bodily function and is being managed actively during this encounter.  All of the below services have been performed independently by signing provider:  review of prior documentation from internal and or external health records.  Review of previous and current lab results.  Interview and comprehensive assessment during patient visit today. Review of current and previous chest radiographs/CT scans. Discussion of management and  test interpretation with health care team and patient/family.   This document was prepared using Dragon voice recognition software and may include unintentional dictation errors.     Vida Rigger, M.D.  Division of Pulmonary & Critical Care Medicine

## 2023-06-02 ENCOUNTER — Other Ambulatory Visit: Payer: Self-pay

## 2023-06-02 ENCOUNTER — Ambulatory Visit: Payer: 59

## 2023-06-02 DIAGNOSIS — A419 Sepsis, unspecified organism: Secondary | ICD-10-CM | POA: Diagnosis not present

## 2023-06-02 DIAGNOSIS — R6520 Severe sepsis without septic shock: Secondary | ICD-10-CM | POA: Diagnosis not present

## 2023-06-02 LAB — BASIC METABOLIC PANEL
Anion gap: 6 (ref 5–15)
BUN: 14 mg/dL (ref 8–23)
CO2: 29 mmol/L (ref 22–32)
Calcium: 8.8 mg/dL — ABNORMAL LOW (ref 8.9–10.3)
Chloride: 102 mmol/L (ref 98–111)
Creatinine, Ser: 0.66 mg/dL (ref 0.61–1.24)
GFR, Estimated: >60 mL/min (ref >60)
Glucose, Bld: 90 mg/dL (ref 70–99)
Potassium: 3.6 mmol/L (ref 3.5–5.1)
Sodium: 137 mmol/L (ref 135–145)

## 2023-06-02 LAB — CBC
HCT: 30 % — ABNORMAL LOW (ref 39.0–52.0)
Hemoglobin: 10.1 g/dL — ABNORMAL LOW (ref 13.0–17.0)
MCH: 31 pg (ref 26.0–34.0)
MCHC: 33.7 g/dL (ref 30.0–36.0)
MCV: 92 fL (ref 80.0–100.0)
Platelets: 159 10*3/uL (ref 150–400)
RBC: 3.26 MIL/uL — ABNORMAL LOW (ref 4.22–5.81)
RDW: 15.9 % — ABNORMAL HIGH (ref 11.5–15.5)
WBC: 6.2 10*3/uL (ref 4.0–10.5)
nRBC: 0.3 % — ABNORMAL HIGH (ref 0.0–0.2)

## 2023-06-02 LAB — RAD ONC ARIA SESSION SUMMARY
Course Elapsed Days: 39
Plan Fractions Treated to Date: 27
Plan Prescribed Dose Per Fraction: 2 Gy
Plan Total Fractions Prescribed: 35
Plan Total Prescribed Dose: 70 Gy
Reference Point Dosage Given to Date: 54 Gy
Reference Point Session Dosage Given: 2 Gy
Session Number: 27

## 2023-06-02 NOTE — Consult Note (Signed)
PULMONOLOGY         Date: 06/02/2023,   MRN# [id number] [name] [birth year]     AdmissionWeight: 104.8 kg                 CurrentWeight: 104.8 kg  Referring provider: Dr Joylene Igo   CHIEF COMPLAINT:   Recurrent exacerbation of COPD   HISTORY OF PRESENT ILLNESS   This is a 62 yo M with hx of hiatal hernia, coagulopathy, electrolyte derrangements, Asthma and COPD overlap with chronic hypoxemia, hx of CVA, hx of psychiatric condition with schizophrenia, who lives in group home and has chief complaint of dyspnea and cough x few days. Cough is productive with phlegm on expectoration. He was also noted to have signs and symptoms of UTI and is currently being treated for sepsis related to urinary source.  He is being treated for acute exacerbation of COPD with some concern for possible overlying pneumonia.  PCCM consultation was placed to evaluate complex pulmonary patient with recurrent hospitalizations. 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   06/02/23- patient shares he feels close to baseline, he is not coughing now.  He is on room air speaking in full sentences.  He is ambulatory around hallway.  He is urinating adequately, he had normal well formed BM yesterday.  Blood work is normal. He is cleared for Discharge from pulmonary perspective  PAST MEDICAL HISTORY   Past Medical History:  Diagnosis Date   Acute on chronic respiratory failure with hypoxia (HCC)    Asthma    Coagulation disorder (HCC)    COPD (chronic obstructive pulmonary disease) (HCC)    Depression    History of hiatal hernia    Hypertension    Hypokalemia    Leukocytosis    Lung mass    Pneumonia    Schizophrenia (HCC)    Shortness of breath dyspnea    Stroke [facility])    Umbilical hernia    Ventral hernia      SURGICAL HISTORY   Past Surgical History:   Procedure Laterality Date   COLONOSCOPY WITH PROPOFOL N/A 09/21/2021   Procedure: COLONOSCOPY WITH PROPOFOL;  Surgeon: Regis Bill, MD;  Location: [facility] ENDOSCOPY;  Service: Endoscopy;  Laterality: N/A;   HERNIA REPAIR     INSERTION OF MESH N/A 12/18/2014   Procedure: INSERTION OF MESH;  Surgeon: Lattie Haw, MD;  Location: [facility] ORS;  Service: General;  Laterality: N/A;   SUPRA-UMBILICAL HERNIA  12/18/2014   Procedure: SUPRA-UMBILICAL HERNIA;  Surgeon: Lattie Haw, MD;  Location: [facility] ORS;  Service: General;;   UMBILICAL HERNIA REPAIR N/A 12/18/2014   Procedure: HERNIA REPAIR UMBILICAL ADULT;  Surgeon: Lattie Haw, MD;  Location: [facility] ORS;  Service: General;  Laterality: N/A;   VIDEO BRONCHOSCOPY WITH ENDOBRONCHIAL ULTRASOUND N/A 03/15/2023   Procedure: VIDEO BRONCHOSCOPY WITH ENDOBRONCHIAL ULTRASOUND;  Surgeon: Vida Rigger, MD;  Location: [facility] ORS;  Service: Thoracic;  Laterality: N/A;     FAMILY HISTORY   Family History  Problem Relation Age of Onset   Asthma Mother    Hypertension Father      SOCIAL HISTORY   Social History   Tobacco Use   Smoking status: Every Day    Current packs/day: 0.15    Average packs/day: 0.2 packs/day for 45.0 years (6.8 ttl pk-yrs)    Types: Cigarettes   Smokeless tobacco: Never  Vaping Use   Vaping status: Never Used  Substance Use Topics   Alcohol use: Not Currently    Alcohol/week: 75.0 standard drinks of alcohol    Types: 75 Cans of beer per week   Drug use: Not Currently    Types: Marijuana, "Crack" cocaine    Comment: none in 15 yrs     MEDICATIONS    Home Medication:    Current Medication:  Current Facility-Administered Medications:    aspirin EC tablet 81 mg, 81 mg, Oral, Daily, Cox, Amy N, DO, 81 mg at 06/02/23 0941   budesonide (PULMICORT) nebulizer solution 0.25 mg, 0.25 mg, Nebulization, BID, Esaw Grandchild A, DO, 0.25 mg at 06/02/23 0845   clonazePAM (KLONOPIN) tablet 1 mg, 1 mg, Oral, QHS, Cox,  Amy N, DO, 1 mg at 06/01/23 2233   clopidogrel (PLAVIX) tablet 75 mg, 75 mg, Oral, Daily, Cox, Amy N, DO, 75 mg at 06/02/23 0941   dextromethorphan-guaiFENesin (MUCINEX DM) 30-600 MG per 12 hr tablet 1 tablet, 1 tablet, Oral, BID, Esaw Grandchild A, DO, 1 tablet at 06/02/23 0942   diltiazem (CARDIZEM CD) 24 hr capsule 240 mg, 240 mg, Oral, Daily, Cox, Amy N, DO, 240 mg at 06/02/23 0941   enoxaparin (LOVENOX) injection 50 mg, 50 mg, Subcutaneous, Q24H, Cox, Amy N, DO, 50 mg at 06/01/23 2233   famotidine (PEPCID) tablet 20 mg, 20 mg, Oral, Daily, Cox, Amy N, DO, 20 mg at 06/02/23 0942   guaiFENesin-dextromethorphan (ROBITUSSIN DM) 100-10 MG/5ML syrup 5 mL, 5 mL, Oral, Q4H PRN, Esaw Grandchild A, DO, 5 mL at 05/30/23 1053   influenza vac split trivalent PF (FLULAVAL) injection 0.5 mL, 0.5 mL, Intramuscular, Tomorrow-1000, Griffith, Kelly A, DO   ipratropium-albuterol (DUONEB) 0.5-2.5 (3) MG/3ML nebulizer solution 3 mL, 3 mL, Nebulization, TID, Denton Lank, Kelly A, DO, 3 mL at 06/02/23 0845   mirtazapine (REMERON) tablet 30 mg, 30 mg, Oral, QHS, Cox, Amy N, DO, 30 mg at 06/01/23 2233   montelukast (SINGULAIR) tablet 10 mg, 10 mg, Oral, QHS, Cox, Amy N, DO, 10 mg at 06/01/23 2233   paliperidone (INVEGA) 24 hr tablet 9 mg, 9 mg, Oral, QHS, Cox, Amy N, DO, 9 mg at 06/01/23 2233   pravastatin (PRAVACHOL) tablet 20 mg, 20 mg, Oral, q1800, Cox, Amy N, DO, 20 mg at 06/01/23 1808   predniSONE (DELTASONE) tablet 40 mg, 40 mg, Oral, Q breakfast, 40 mg at 06/02/23 0941 **FOLLOWED BY** [START ON 06/04/2023] predniSONE (DELTASONE) tablet 30 mg, 30 mg, Oral, Q breakfast **FOLLOWED BY** [START ON 06/07/2023] predniSONE (DELTASONE) tablet 20 mg, 20 mg, Oral, Q breakfast **FOLLOWED BY** [START ON 06/10/2023] predniSONE (DELTASONE) tablet 10 mg, 10 mg, Oral, Q breakfast, [name], [name], MD   senna-docusate (Senokot-S) tablet 1 tablet, 1 tablet, Oral, QHS PRN, Cox, Amy N, DO, 1 tablet at 05/27/23 2103   sodium chloride  HYPERTONIC 3 % nebulizer solution 4 mL, 4 mL, Nebulization, BID, Denton Lank, Kelly A, DO, 4 mL at 06/02/23 0845   traZODone (DESYREL) tablet 150 mg, 150 mg, Oral, QHS PRN, Cox, Amy N, DO, 150 mg at 05/31/23 2137   [DISCONTINUED] fluticasone furoate-vilanterol (BREO ELLIPTA) 100-25 MCG/ACT 1 puff, 1 puff, Inhalation, Daily, 1 puff at 05/29/23 0903 **AND** umeclidinium bromide (INCRUSE ELLIPTA) 62.5 MCG/ACT 1 puff, 1 puff, Inhalation, Daily, Cox, Amy N, DO, 1 puff at 06/01/23 0916    ALLERGIES   Patient has no known allergies.     REVIEW OF SYSTEMS    Review of Systems:  Gen:  Denies  fever, sweats, chills weigh loss  HEENT: Denies blurred vision, double vision, ear pain, eye pain, hearing loss, nose bleeds, sore throat Cardiac:  No dizziness, chest pain or heaviness, chest tightness,edema Resp:   reports dyspnea chronically  Gi: Denies swallowing difficulty, stomach pain, nausea or vomiting, diarrhea, constipation, bowel incontinence Gu:  Denies bladder incontinence, burning urine Ext:   Denies Joint pain, stiffness or swelling Skin: Denies  skin rash, easy bruising or bleeding or hives Endoc:  Denies polyuria, polydipsia , polyphagia or weight change Psych:   Denies depression, insomnia or hallucinations   Other:  All other systems negative   VS: BP 129/87 (BP Location: Left Arm)   Pulse 93   Temp (!) 97.5 F (36.4 C) (Oral)   Resp 20   Ht 6' (1.829 m)   Wt 104.8 kg   SpO2 96%   BMI 31.33 kg/m      PHYSICAL EXAM    GENERAL:NAD, no fevers, chills, no weakness no fatigue HEAD: Normocephalic, atraumatic.  EYES: Pupils equal, round, reactive to light. Extraocular muscles intact. No scleral icterus.  MOUTH: Moist mucosal membrane. Dentition intact. No abscess noted.  EAR, NOSE, THROAT: Clear without exudates. No external lesions.  NECK: Supple. No thyromegaly. No nodules. No JVD.  PULMONARY: decreased breath sounds with mild rhonchi worse at bases bilaterally.   CARDIOVASCULAR: S1 and S2. Regular rate and rhythm. No murmurs, rubs, or gallops. No edema. Pedal pulses 2+ bilaterally.  GASTROINTESTINAL: Soft, nontender, nondistended. No masses. Positive bowel sounds. No hepatosplenomegaly.  MUSCULOSKELETAL: No swelling, clubbing, or edema. Range of motion full in all extremities.  NEUROLOGIC: Cranial nerves II through XII are intact. No gross focal neurological deficits. Sensation intact. Reflexes intact.  SKIN: No ulceration, lesions, rashes, or cyanosis. Skin warm and dry. Turgor intact.  PSYCHIATRIC: Mood, affect within normal limits. The patient is awake, alert and oriented x 3. Insight, judgment intact.       IMAGING     ASSESSMENT/PLAN   Advanced COPD with Asthma Overlap syndrome   - patient currently on appropriate COPD care path   - agree with Prednisone PO taper    - continue nebulizer therapy with Duoneb TID   - continue pulmicort 0.25 bid   - continue incruse ellipta one puff daily   - Singulair 10mg  at bedtime  -PT/OT    Bilateral mucus plugging of bronchi   - non invasive modalities - agree with hypertonic saline   - duoneb  - flutter valve    Spiculated 13mm left upper lobe nodule  - patient has been treated Oct 2024 with radiation oncology s/p SBRT   Atelectasis   - incentive spirometry and physical therapy initially        Thank you for allowing me to participate in the care of this patient.   Patient/Family are satisfied with care plan and all questions have been answered.    Provider disclosure: Patient with at least one acute or chronic illness or injury that poses a threat to life or bodily function and is being managed actively during this encounter.  All of the below services have been performed independently by signing provider:  review of prior documentation from internal and or external health records.  Review of previous and current lab results.  Interview and comprehensive assessment during patient  visit today. Review of current and previous chest radiographs/CT scans. Discussion of management and test interpretation with health care team and patient/family.   This document was prepared using Conservation officer, historic buildings  and may include unintentional dictation errors.     Vida Rigger, M.D.  Division of Pulmonary & Critical Care Medicine

## 2023-06-02 NOTE — Progress Notes (Signed)
Progress Note   Patient: [name] ZOX:[id number] DOB: 05/15/1961 DOA: 05/25/2023     8 DOS: the patient was seen and examined on 06/02/2023   Brief hospital course:  Mr. Eh Waver is a 62 year old male with history of depression, anxiety, CAD, hypertension, hyperlipidemia, who presented to the ED from group home on 05/25/2023 for evaluation of shortness of breath x 3 days, with productive cough. See H&P for full HPI on admission and ED course.   Patient was admitted for COPD with acute exacerbation and severe sepsis due to likely pneumonia versus UTI.  Started on IV steroids, IV antibiotics, nebulizer treatments and supportive / symptomatic care.   Further hospital course and management as outlined below.   In summary, patient treated with IV antibiotics, steroids, bronchodilators as below.  Transitioned to PO prednisone and antibiotics to complete course.  Overall, pt has remained stable on room air, but slow to improve from COPD exacerbation with ongoing diffuse wheezing and coarse productive cough.   Requested nurses encourage frequent pulmonary hygiene with flutter and IS.      Assessment and Plan:  * Severe sepsis with acute organ dysfunction (HCC) Likely due to PNA.  UA abnormal but pt denies UTI symptoms.   Negative Covid / flu / RSV Presented with sepsis criteria of leukopenia, tachypnea, tachycardia, lactic acidosis consistent with organ involvement and severe sepsis. --Initially on IV Rocephin & Zithromax --PO Ceftin, PO Zithromax and completed course on 10/21      COPD exacerbation (HCC) Likely due to RLL pneumonia. 10/22 -- pt minimally improved thus far despite typical COPD management.  Reviewed case and imaging with ICU Dr. 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 -- Patient was on IV steroids and has been transitioned to prednisone taper --Scheduled Duonebs  and PRN albuterol --Pulmicort nebs -- Continue Incruse Ellipta --Continue Singulair at bedtime --IS & flutter for pulmonary hygiene --Appreciate pulmonary consult     Insomnia Continue home trazodone    Pulmonary nodule, right On CTA chest on admission: New small solid pulmonary nodule of the right upper lobe measuring 4 mm.  --Recommend attention on follow-up per clinical protocol.   Adenocarcinoma of left lung (HCC) No acute issues. Patient is status post SBRT in Oct, 2024  Follow up with Oncology as scheduled   Nicotine dependence As needed nicotine patch    Schizophrenia (HCC) Continue home paliperidone    Essential hypertension Continue home diltiazem                Subjective: Patient is seen and examined at the bedside.  Sounds better this morning.  Last auditory wheezing  Physical Exam: Vitals:   06/02/23 0340 06/02/23 0838 06/02/23 0847 06/02/23 1553  BP: 107/76 129/87  118/81  Pulse: 74 93  89  Resp: 18 20  20   Temp: 98.8 F (37.1 C) (!) 97.5 F (36.4 C)  97.9 F (36.6 C)  TempSrc:  Oral  Oral  SpO2: 96% 97% 96% 91%  Weight:      Height:       General exam: awake, alert, no acute distress HEENT: moist mucus membranes, hearing grossly normal, dentures loose Respiratory system: Improved high-pitched expiratory wheezes worse on the right than left, on room air, normal respiratory effort.  Ongoing coarse-sounding cough. Cardiovascular system: normal S1/S2, RRR, , no pedal edema.   Gastrointestinal system: soft, NT, ND Central nervous system: A&O x2+. no gross  focal neurologic deficits, normal speech Extremities: moves all, no edema, normal tone Psychiatry: normal mood, flat affect     Data Reviewed: Labs reviewed.  Within normal limits There are no new results to review at this time.  Family Communication: Plan of care discussed with patient in detail  Disposition: Status is: Inpatient Remains inpatient appropriate because: Discharge in  a.m. to group home  Planned Discharge Destination:  Group home    Time spent: 30 minutes  Author: Lucile Shutters, MD 06/02/2023 3:56 PM  For on call review [email or web address].

## 2023-06-02 NOTE — Progress Notes (Signed)
Physical Therapy Treatment Patient Details Name: [name] MRN: [id number] DOB: [birth year] Today's Date: 06/02/2023   History of Present Illness presented to ER Secondary to SOB; admitted for management of sepsis related to ?UTI vs R LL PNA?    PT Comments  Pt alert and receptive to therapy, but fatigued 2/2 radiation prior to session. Able to follow commands with delayed processing. Mod I with bed mobility, supervision for STS and amb with RW, and CGA for amb without RW. Pt stable with ambulation, no LOB or postural sway noted, but presenting with increased SOB and wheezing post mobility. Pt left in bed, SpO2 95%, and RN notified of symptoms with mobility. Pt would continue to benefit from skilled acute PT to address functional limitations and progress towards mobility goals.   If plan is discharge home, recommend the following: A little help with walking and/or transfers;A little help with bathing/dressing/bathroom   Can travel by private vehicle      yes  Equipment Recommendations  Rolling walker (2 wheels)    Recommendations for Other Services       Precautions / Restrictions Precautions Precautions: Fall Restrictions Weight Bearing Restrictions: No     Mobility  Bed Mobility Overal bed mobility: Modified Independent                  Transfers Overall transfer level: Needs assistance Equipment used: Rolling walker (2 wheels) Transfers: Sit to/from Stand Sit to Stand: Supervision                Ambulation/Gait Ambulation/Gait assistance: Contact guard assist, Supervision Gait Distance (Feet): 200 Feet Assistive device: Rolling walker (2 wheels), None         General Gait Details: Supervision to walk 60' with RW; CGA to walk 140' without RW. No LOB noted, reciprocal gait pattern but limited by SOB and fatigue from radiation earlier today (SpO2 95% post session).   Stairs             Wheelchair Mobility     Tilt Bed    Modified Rankin  (Stroke Patients Only)       Balance Overall balance assessment: Needs assistance Sitting-balance support: No upper extremity supported, Feet supported Sitting balance-Leahy Scale: Normal     Standing balance support: No upper extremity supported, Bilateral upper extremity supported Standing balance-Leahy Scale: Good Standing balance comment: trialed standing balance through gait with and without RW; stable t/o, no postural sway noted                            Cognition Arousal: Alert Behavior During Therapy: WFL for tasks assessed/performed Overall Cognitive Status: Within Functional Limits for tasks assessed                                 General Comments: Follows commands; pleasant and cooperative. Mild delay in processing and task initiation        Exercises      General Comments        Pertinent Vitals/Pain Pain Assessment Pain Assessment: No/denies pain    Home Living                          Prior Function            PT Goals (current goals can now be found in the care plan section) Acute Rehab  PT Goals Patient Stated Goal: to get back home PT Goal Formulation: With patient Time For Goal Achievement: 06/12/23 Potential to Achieve Goals: Good Progress towards PT goals: Progressing toward goals    Frequency    Min 1X/week      PT Plan      Co-evaluation              AM-PAC PT "6 Clicks" Mobility   Outcome Measure  Help needed turning from your back to your side while in a flat bed without using bedrails?: None Help needed moving from lying on your back to sitting on the side of a flat bed without using bedrails?: None Help needed moving to and from a bed to a chair (including a wheelchair)?: None Help needed standing up from a chair using your arms (e.g., wheelchair or bedside chair)?: None Help needed to walk in hospital room?: A Little Help needed climbing 3-5 steps with a railing? : A Little 6  Click Score: 22    End of Session Equipment Utilized During Treatment: Gait belt Activity Tolerance: Patient tolerated treatment well (more wheezing noted this session) Patient left: in bed;with call bell/phone within reach;with bed alarm set Nurse Communication: Mobility status (wheezing with mobility) PT Visit Diagnosis: Muscle weakness (generalized) (M62.81);Difficulty in walking, not elsewhere classified (R26.2)     Time: 1610-9604 PT Time Calculation (min) (ACUTE ONLY): 11 min  Charges:    $Therapeutic Activity: 8-22 mins PT General Charges $$ ACUTE PT VISIT: 1 Visit                      Shauna Hugh, SPT 06/02/2023, 3:16 PM

## 2023-06-02 NOTE — Care Management Important Message (Signed)
Important Message  Patient Details  Name: [name] MRN: [id number] Date of Birth: 10-12-1960   Important Message Given:  Yes - Medicare IM     Darolyn Rua, LCSW 06/02/2023, 10:58 AM

## 2023-06-02 NOTE — TOC Progression Note (Signed)
Transition of Care [facility]) - Progression Note    Patient Details  Name: [name] MRN: [id number] Date of Birth: 07/10/1961  Transition of Care [facility]) CM/SW Contact  Darolyn Rua, [place] Phone Number: 06/02/2023, 11:48 AM  Clinical Narrative:     Per MD potential discharge tomorrow,   CSW called Marlette from [facility] @ [phone].   Randa Evens answered and reported Linzie Collin was out, to call the owner Tammy at [phone]. No answer, unable to lvm mailbox is full.   CSW called Randa Evens back to inform of above, told to call [name] the group home manager at [phone], he confirms patient can return tomorrow with home health and to call main group home number at [phone] once ready to dc.   [facility] home [facility] is set up , Elnita Maxwell with [facility] updated on dc eta.   Previous handoff indicates  FL2 and discharge summary be emailed to [email or web address]    Expected Discharge Plan: Group Home Barriers to Discharge: Continued Medical Work up  Expected Discharge Plan and Services       Living arrangements for the past 2 months: Group Home                                       Social Determinants of Health (SDOH) Interventions SDOH Screenings   Food Insecurity: No Food Insecurity (05/29/2023)  Housing: Low Risk  (05/29/2023)  Transportation Needs: No Transportation Needs (05/29/2023)  Utilities: Not At Risk (05/29/2023)  Depression (PHQ2-9): Low Risk  (02/03/2023)  Tobacco Use: High Risk (05/25/2023)    Readmission Risk Interventions    05/26/2023    2:38 PM  Readmission Risk Prevention Plan  Transportation Screening Complete  PCP or Specialist Appt within 3-5 Days Complete  HRI or Home Care Consult Complete  Social Work Consult for Recovery Care Planning/Counseling Complete  Palliative Care Screening Not Applicable  Medication Review [employer]) Complete

## 2023-06-03 DIAGNOSIS — A419 Sepsis, unspecified organism: Secondary | ICD-10-CM | POA: Diagnosis not present

## 2023-06-03 DIAGNOSIS — R6520 Severe sepsis without septic shock: Secondary | ICD-10-CM | POA: Diagnosis not present

## 2023-06-03 MED ORDER — IPRATROPIUM-ALBUTEROL 0.5-2.5 (3) MG/3ML IN SOLN: 3.0000 mL | Freq: Three times a day (TID) | RESPIRATORY_TRACT | 0 refills | Status: DC

## 2023-06-03 MED ORDER — PREDNISONE 10 MG PO TABS: ORAL_TABLET | ORAL | 0 refills | Status: AC

## 2023-06-03 NOTE — Plan of Care (Signed)
  Problem: Education: Goal: Knowledge of General Education information will improve Description: Including pain rating scale, medication(s)/side effects and non-pharmacologic comfort measures Outcome: Progressing   Problem: Health Behavior/Discharge Planning: Goal: Ability to manage health-related needs will improve Outcome: Progressing   Problem: Clinical Measurements: Goal: Ability to maintain clinical measurements within normal limits will improve Outcome: Progressing Goal: Will remain free from infection Outcome: Progressing Goal: Diagnostic test results will improve Outcome: Progressing Goal: Respiratory complications will improve Outcome: Progressing Goal: Cardiovascular complication will be avoided Outcome: Progressing   Problem: Nutrition: Goal: Adequate nutrition will be maintained Outcome: Progressing   Problem: Coping: Goal: Level of anxiety will decrease Outcome: Progressing   Problem: Safety: Goal: Ability to remain free from injury will improve Outcome: Progressing   Problem: Skin Integrity: Goal: Risk for impaired skin integrity will decrease Outcome: Progressing

## 2023-06-03 NOTE — Discharge Summary (Signed)
Physician Discharge Summary   Patient: [name] MRN: [id number] DOB: [birth year]  Admit date:     05/25/2023  Discharge date: 06/05/23  Discharge Physician: Kayal Mula   PCP: Emogene Morgan, MD   Recommendations at discharge:   Complete systemic steroids Follow-up with pulmonary as an out patient  Discharge Diagnoses: Principal Problem:   Severe sepsis with acute organ dysfunction (HCC) Active Problems:   COPD exacerbation (HCC)   Essential hypertension   Schizophrenia (HCC)   Nicotine dependence   Adenocarcinoma of left lung (HCC)   Pulmonary nodule, right   Insomnia   Abnormal urinalysis  Resolved Problems:   * No resolved hospital problems. [facility] Course:  Chief Concern: Shortness of breath   HPI: [name] is a 62 year old male with history of depression, anxiety, CAD, hypertension, hyperlipidemia, who presents emergency department for chief concerns of shortness of breath.   Vitals in the ED showed temperature of 98.4, respiration rate of 26, heart rate of 119, blood pressure 153/90, SpO2 of 95% on 12 L high flow nasal cannula.   Serum sodium is 136, potassium 3.5, chloride 104, bicarb 24, BUN of 6, serum creatinine of 0.68, EGFR greater than 60, nonfasting blood glucose 120, WBC 3.0, hemoglobin 10.0, platelets of 147.   Lactic acid is 2.2, on repeat is 1.3.  High sensitive troponin was 8 and on repeat was 6.  UA was positive for trace leukocytes and positive for nitrates.  COVID/influenza A/influenza B/RSV PCR were negative.   ED treatment: DuoNebs x 2, Solu-Medrol 125 mg IV one-time dose, azithromycin 500 mg IV, ceftriaxone 2 g IV, LR 1 L bolus ------------------------------ At bedside, patient was able to tell me his name, age, current calendar year.   He has been having productive cough for 3 days. He denies known sick contacts.  He states that no one at the group home has been sick that he knows of.   He reports he is coughing up green  mucus.  He denies blood, chest pain, abdominal pain, dysuria, hematuria, diarrhea, syncope.   Social history: He lives in a group home, [facility]. He denies tobacco use, quitting  in 04/2023. Formerly, he smoked 1/3 ppd. He denies etoh and recreational drug use.      Assessment and Plan:  * Severe sepsis with acute organ dysfunction (HCC) Likely due to PNA.  UA abnormal but pt denies UTI symptoms.   Negative Covid / flu / RSV Presented with sepsis criteria of leukopenia, tachypnea, tachycardia, lactic acidosis consistent with organ involvement and severe sepsis. --Initially on IV Rocephin & Zithromax and switched to oral Ceftin and Zithromax. -- Patient completed course of antibiotics on 10/21       COPD exacerbation (HCC) Likely due to RLL pneumonia. 10/22 -- pt minimally improved thus far despite typical COPD management.  Reviewed case and imaging with ICU Dr. 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 -- Patient was on IV steroids and has been transitioned to prednisone taper --Scheduled Duonebs and PRN albuterol --Pulmicort nebs -- Continue Incruse Ellipta --Continue Singulair at bedtime --IS & flutter for pulmonary hygiene --Appreciate pulmonary consult     Insomnia Continue home trazodone     Pulmonary nodule, right On CTA chest on admission: New small solid pulmonary nodule of the right upper lobe measuring 4 mm.  Follow-up with pulmonary as an outpatient   Adenocarcinoma of left lung (  HCC) No acute issues. Patient is status post SBRT in Oct, 2024  Follow up with Oncology as scheduled   Nicotine dependence As needed nicotine patch    Schizophrenia (HCC) Continue home paliperidone    Essential hypertension Continue home diltiazem           Consultants: Pulmonary Procedures performed: None Disposition: Home Diet recommendation:  Discharge Diet  Orders (From admission, onward)     Start     Ordered   06/03/23 0000  Diet - low sodium heart healthy        06/03/23 1252           Cardiac diet DISCHARGE MEDICATION: Allergies as of 06/05/2023   No Known Allergies      Medication List     STOP taking these medications    B-6 100 MG Tabs   ketoconazole 2 % cream Commonly known as: NIZORAL   lactulose 10 GM/15ML solution Commonly known as: CHRONULAC   multivitamin with minerals tablet   potassium chloride 10 MEQ tablet Commonly known as: KLOR-CON M   potassium chloride 10 MEQ tablet Commonly known as: KLOR-CON   VITAMIN D (ERGOCALCIFEROL) PO       TAKE these medications    albuterol 108 (90 Base) MCG/ACT inhaler Commonly known as: VENTOLIN HFA Inhale 2 puffs into the lungs every 4 (four) hours as needed for wheezing or shortness of breath.   aspirin EC 81 MG tablet Take 81 mg by mouth daily. Swallow whole.   clonazePAM 1 MG tablet Commonly known as: KLONOPIN Take 1 tablet (1 mg total) by mouth at bedtime.   clopidogrel 75 MG tablet Commonly known as: PLAVIX Take 1 tablet (75 mg total) by mouth daily.   dextromethorphan-guaiFENesin 10-100 MG/5ML liquid Commonly known as: ROBITUSSIN-DM Take 10 mLs by mouth every 6 (six) hours as needed for cough.   diltiazem 240 MG 24 hr capsule Commonly known as: CARDIZEM CD Take 1 capsule (240 mg total) by mouth daily.   docusate sodium 100 MG capsule Commonly known as: COLACE Take 100 mg by mouth 2 (two) times daily as needed for mild constipation.   famotidine 20 MG tablet Commonly known as: PEPCID Take 1 tablet (20 mg total) by mouth daily.   Fish Oil 1000 MG Cpdr Take 1,000 mg by mouth at bedtime.   guaiFENesin 600 MG 12 hr tablet Commonly known as: MUCINEX Take 600 mg by mouth 2 (two) times daily as needed.   ipratropium-albuterol 0.5-2.5 (3) MG/3ML Soln Commonly known as: DUONEB Take 3 mLs by nebulization 3 (three) times daily. What  changed:  when to take this reasons to take this   lovastatin 20 MG tablet Commonly known as: MEVACOR Take 20 mg by mouth at bedtime.   mirtazapine 30 MG tablet Commonly known as: REMERON Take 1 tablet (30 mg total) by mouth at bedtime.   montelukast 10 MG tablet Commonly known as: SINGULAIR Take 1 tablet (10 mg total) by mouth at bedtime.   paliperidone 9 MG 24 hr tablet Commonly known as: INVEGA Take 1 tablet (9 mg total) by mouth at bedtime.   predniSONE 10 MG tablet Commonly known as: DELTASONE Take 3 tablets (30 mg total) by mouth daily with breakfast for 1 day, THEN 2 tablets (20 mg total) daily with breakfast for 3 days, THEN 1 tablet (10 mg total) daily with breakfast for 3 days. Start taking on: June 04, 2023   prochlorperazine 10 MG tablet Commonly known as: COMPAZINE Take 1 tablet (10  mg total) by mouth every 6 (six) hours as needed for nausea or vomiting.   traZODone 150 MG tablet Commonly known as: DESYREL Take 1 tablet (150 mg total) by mouth at bedtime as needed for sleep.   Trelegy Ellipta 100-62.5-25 MCG/ACT Aepb Generic drug: Fluticasone-Umeclidin-Vilant Inhale 1 puff into the lungs daily.         Discharge Exam: Filed Weights   05/25/23 0803  Weight: 104.8 kg   General exam: awake, alert, no acute distress HEENT: moist mucus membranes, hearing grossly normal, dentures loose Respiratory system: Scattered expiratory wheezes Cardiovascular system: normal S1/S2, RRR, , no pedal edema.   Gastrointestinal system: soft, NT, ND Central nervous system: A&O x2+. no gross focal neurologic deficits, normal speech Extremities: moves all, no edema, normal tone Psychiatry: normal mood, flat affect   Condition at discharge: stable  The results of significant diagnostics from this hospitalization (including imaging, microbiology, ancillary and laboratory) are listed below for reference.   Imaging Studies: DG Chest Port 1 View  Result Date:  05/28/2023 CLINICAL DATA:  Cough. EXAM: PORTABLE CHEST 1 VIEW COMPARISON:  Radiograph and CT 05/25/2023 FINDINGS: Again seen emphysema. Streaky opacity both lung bases, right greater than left. Scarring in the periphery of the right mid lung. Stable heart size and mediastinal contours allowing for lower lung bases. No pleural effusion or pneumothorax. IMPRESSION: 1. Streaky opacity both lung bases, right greater than left, favor atelectasis. The possibility of pneumonia is not entirely excluded. 2. Emphysema. Electronically Signed   By: Narda Rutherford M.D.   On: 05/28/2023 14:04   CT Angio Chest PE W and/or Wo Contrast  Result Date: 05/25/2023 CLINICAL DATA:  Shortness of breath, productive cough for 3 days; * Tracking Code: BO * EXAM: CT ANGIOGRAPHY CHEST WITH CONTRAST TECHNIQUE: Multidetector CT imaging of the chest was performed using the standard protocol during bolus administration of intravenous contrast. Multiplanar CT image reconstructions and MIPs were obtained to evaluate the vascular anatomy. RADIATION DOSE REDUCTION: This exam was performed according to the departmental dose-optimization program which includes automated exposure control, adjustment of the mA and/or kV according to patient size and/or use of iterative reconstruction technique. CONTRAST:  OMNIPAQUE IOHEXOL 350 MG/ML SOLN COMPARISON:  Multiple priors, most recent chest CT dated March 10, 2023 FINDINGS: Cardiovascular: No evidence of pulmonary embolus. Limited evaluation of the segmental and subsegmental pulmonary arteries due to motion artifact. Normal heart size. No pericardial effusion. Normal caliber thoracic aorta with moderate atherosclerotic disease. Severe coronary artery calcifications. Dilated main pulmonary artery, measuring up to 3.7 cm. Mediastinum/Nodes: Esophagus and thyroid are unremarkable. No enlarged lymph nodes seen in the chest. Lungs/Pleura: Central airways are patent. Emphysema. Spiculated solid nodule of  the left upper lobe measuring 14 x 13 mm on series 5, image 47, previously 16 x 14 mm. Stable posttreatment change of the right upper lobe. New right lower lobe consolidation. New solid pulmonary nodule of the right upper lobe measuring 4 mm on series 5, image 66. Upper Abdomen: Partially visualized low-attenuation renal lesions, likely simple cysts. Stable bilateral adrenal adenomas. Gallstones. Musculoskeletal: No chest wall abnormality. No acute or significant osseous findings. Review of the MIP images confirms the above findings. IMPRESSION: 1. No evidence of pulmonary embolus. Limited evaluation of the segmental and subsegmental pulmonary arteries due to motion artifact. 2. New mild right lower lobe consolidation, likely due to infection or aspiration. 3. Spiculated nodule of the right upper lobe is slightly decreased in size. 4. New small solid pulmonary nodule of the  right upper lobe measuring 4 mm. Recommend attention on follow-up per clinical protocol. 5. Stable posttreatment change of the right upper lobe. Electronically Signed   By: Allegra Lai M.D.   On: 05/25/2023 12:25   DG Chest Port 1 View  Result Date: 05/25/2023 CLINICAL DATA:  Questionable sepsis - evaluate for abnormality. Shortness of breath. History of lung cancer. COPD. Productive cough for 3 days. EXAM: PORTABLE CHEST 1 VIEW COMPARISON:  03/15/2023. FINDINGS: Redemonstration of linear areas of fibrosis/scarring overlying the right mid lung zone, laterally. There is a new linear area of probable atelectasis overlying the left lower lung zone. Bilateral lung fields are otherwise clear. No acute consolidation or lung collapse. Bilateral costophrenic angles are clear. Normal cardio-mediastinal silhouette. No acute osseous abnormalities. The soft tissues are within normal limits. IMPRESSION: *No acute cardiopulmonary disease. Electronically Signed   By: Jules Schick M.D.   On: 05/25/2023 08:48    Microbiology: Results for orders  placed or performed during the hospital encounter of 05/25/23  Blood Culture (routine x 2)     Status: None   Collection Time: 05/25/23  8:08 AM   Specimen: BLOOD  Result Value Ref Range Status   Specimen Description BLOOD RIGHT [facility]  Final   Special Requests   Final    BOTTLES DRAWN AEROBIC AND ANAEROBIC Blood Culture results may not be optimal due to an inadequate volume of blood received in culture bottles   Culture   Final    NO GROWTH 5 DAYS Performed at [facility], [address] [place], [place] [address]    Report Status 05/30/2023 FINAL  Final  Blood Culture (routine x 2)     Status: None   Collection Time: 05/25/23  8:08 AM   Specimen: BLOOD  Result Value Ref Range Status   Specimen Description BLOOD RIGHT ARM  Final   Special Requests   Final    BOTTLES DRAWN AEROBIC AND ANAEROBIC Blood Culture results may not be optimal due to an inadequate volume of blood received in culture bottles   Culture   Final    NO GROWTH 5 DAYS Performed at [facility], [address] [place], [place] [address]    Report Status 05/30/2023 FINAL  Final  Resp panel by RT-PCR (RSV, Flu A&B, Covid) Anterior Nasal Swab     Status: None   Collection Time: 05/25/23  9:27 AM   Specimen: Anterior Nasal Swab  Result Value Ref Range Status   SARS Coronavirus 2 by RT PCR NEGATIVE NEGATIVE Final    Comment: (NOTE) SARS-CoV-2 target nucleic acids are NOT DETECTED.  The SARS-CoV-2 RNA is generally detectable in upper respiratory specimens during the acute phase of infection. The lowest concentration of SARS-CoV-2 viral copies this assay can detect is 138 copies/mL. A negative result does not preclude SARS-Cov-2 infection and should not be used as the sole basis for treatment or other patient management decisions. A negative result may occur with  improper specimen collection/handling, submission of specimen other than nasopharyngeal swab, presence of viral mutation(s) within  the areas targeted by this assay, and inadequate number of viral copies(<138 copies/mL). A negative result must be combined with clinical observations, patient history, and epidemiological information. The expected result is Negative.  Fact Sheet for Patients:  BloggerCourse.com  Fact Sheet for Healthcare Providers:  SeriousBroker.it  This test is no t yet approved or cleared by the [place] FDA and  has been authorized for detection and/or diagnosis of SARS-CoV-2 by FDA under  an Emergency Use Authorization (EUA). This EUA will remain  in effect (meaning this test can be used) for the duration of the COVID-19 declaration under Section 564(b)(1) of the Act, 21 U.S.C.section 360bbb-3(b)(1), unless the authorization is terminated  or revoked sooner.       Influenza A by PCR NEGATIVE NEGATIVE Final   Influenza B by PCR NEGATIVE NEGATIVE Final    Comment: (NOTE) The Xpert Xpress SARS-CoV-2/FLU/RSV plus assay is intended as an aid in the diagnosis of influenza from Nasopharyngeal swab specimens and should not be used as a sole basis for treatment. Nasal washings and aspirates are unacceptable for Xpert Xpress SARS-CoV-2/FLU/RSV testing.  Fact Sheet for Patients: BloggerCourse.com  Fact Sheet for Healthcare Providers: SeriousBroker.it  This test is not yet approved or cleared by the [place] FDA and has been authorized for detection and/or diagnosis of SARS-CoV-2 by FDA under an Emergency Use Authorization (EUA). This EUA will remain in effect (meaning this test can be used) for the duration of the COVID-19 declaration under Section 564(b)(1) of the Act, 21 U.S.C. section 360bbb-3(b)(1), unless the authorization is terminated or revoked.     Resp Syncytial Virus by PCR NEGATIVE NEGATIVE Final    Comment: (NOTE) Fact Sheet for  Patients: BloggerCourse.com  Fact Sheet for Healthcare Providers: SeriousBroker.it  This test is not yet approved or cleared by the [place] FDA and has been authorized for detection and/or diagnosis of SARS-CoV-2 by FDA under an Emergency Use Authorization (EUA). This EUA will remain in effect (meaning this test can be used) for the duration of the COVID-19 declaration under Section 564(b)(1) of the Act, 21 U.S.C. section 360bbb-3(b)(1), unless the authorization is terminated or revoked.  Performed at [facility], [address] [place], [place] [address]   Urine Culture     Status: None   Collection Time: 05/25/23  9:27 AM   Specimen: Urine, Random  Result Value Ref Range Status   Specimen Description   Final    URINE, RANDOM Performed at [facility], [address], [place], [place] [address]    Special Requests   Final    NONE Reflexed from [id number] Performed at [facility], [address], [place], [place] [address]    Culture   Final    NO GROWTH Performed at [facility], [address] [place], [place] [address]    Report Status 05/26/2023 FINAL  Final    Labs: CBC: Recent Labs  Lab 05/30/23 0421 05/31/23 0457 06/02/23 0441  WBC 5.6 6.0 6.2  HGB 9.1* 9.8* 10.1*  HCT 26.7* 30.0* 30.0*  MCV 90.5 92.6 92.0  PLT 151 176 159   Basic Metabolic Panel: Recent Labs  Lab 05/30/23 0421 05/31/23 0457 06/02/23 0441  NA 138 137 137  K 3.4* 4.5 3.6  CL 103 104 102  CO2 28 26 29   GLUCOSE 139* 122* 90  BUN 11 12 14   CREATININE 0.56* 0.59* 0.66  CALCIUM 8.8* 9.2 8.8*   Liver Function Tests: No results for input(s): "AST", "ALT", "ALKPHOS", "BILITOT", "PROT", "ALBUMIN" in the last 168 hours. CBG: Recent Labs  Lab 05/30/23 1301 05/30/23 1557  GLUCAP 152* 175*    Discharge time spent: greater than 30 minutes.  Signed: Lucile Shutters, MD [facility] 06/05/2023

## 2023-06-03 NOTE — Progress Notes (Signed)
PULMONOLOGY         Date: 06/03/2023,   MRN# [id number] [name] September 18, 1960     AdmissionWeight: 104.8 kg                 CurrentWeight: 104.8 kg  Referring provider: Dr Joylene Igo   CHIEF COMPLAINT:   Recurrent exacerbation of COPD   HISTORY OF PRESENT ILLNESS   This is a 62 yo M with hx of hiatal hernia, coagulopathy, electrolyte derrangements, Asthma and COPD overlap with chronic hypoxemia, hx of CVA, hx of psychiatric condition with schizophrenia, who lives in group home and has chief complaint of dyspnea and cough x few days. Cough is productive with phlegm on expectoration. He was also noted to have signs and symptoms of UTI and is currently being treated for sepsis related to urinary source.  He is being treated for acute exacerbation of COPD with some concern for possible overlying pneumonia.  PCCM consultation was placed to evaluate complex pulmonary patient with recurrent hospitalizations. 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    06/02/23- patient is stable with no acute events overnight.  He has follow up scheduled with med/onc already.  I can see him for COPD on outpatient , he is cleared for dc home   PAST MEDICAL HISTORY   Past Medical History:  Diagnosis Date   Acute on chronic respiratory failure with hypoxia (HCC)    Asthma    Coagulation disorder (HCC)    COPD (chronic obstructive pulmonary disease) (HCC)    Depression    History of hiatal hernia    Hypertension    Hypokalemia    Leukocytosis    Lung mass    Pneumonia    Schizophrenia (HCC)    Shortness of breath dyspnea    Stroke [facility])    Umbilical hernia    Ventral hernia      SURGICAL HISTORY   Past Surgical History:  Procedure Laterality Date   COLONOSCOPY WITH PROPOFOL N/A 09/21/2021   Procedure: COLONOSCOPY WITH PROPOFOL;  Surgeon:  Regis Bill, MD;  Location: [facility] END[facility]COPY;  Service: Endoscopy;  Laterality: N/A;   HERNIA REPAIR     INSERTION OF MESH N/A 12/18/2014   Procedure: INSERTION OF MESH;  Surgeon: Lattie Haw, MD;  Location: [facility] ORS;  Service: General;  Laterality: N/A;   SUPRA-UMBILICAL HERNIA  12/18/2014   Procedure: SUPRA-UMBILICAL HERNIA;  Surgeon: Lattie Haw, MD;  Location: [facility] ORS;  Service: General;;   UMBILICAL HERNIA REPAIR N/A 12/18/2014   Procedure: HERNIA REPAIR UMBILICAL ADULT;  Surgeon: Lattie Haw, MD;  Location: [facility] ORS;  Service: General;  Laterality: N/A;   VIDEO BRONCHOSCOPY WITH ENDOBRONCHIAL ULTRASOUND N/A 03/15/2023   Procedure: VIDEO BRONCHOSCOPY WITH ENDOBRONCHIAL ULTRASOUND;  Surgeon: Vida Rigger, MD;  Location: [facility] ORS;  Service: Thoracic;  Laterality: N/A;     FAMILY HISTORY   Family History  Problem Relation Age of Onset   Asthma Mother    Hypertension Father      SOCIAL HISTORY   Social History   Tobacco Use   Smoking status: Every Day    Current packs/day: 0.15    Average packs/day: 0.2 packs/day for 45.0 years (6.8 ttl pk-yrs)    Types: Cigarettes   Smokeless tobacco: Never  Vaping Use   Vaping status: Never Used  Substance Use Topics   Alcohol use: Not  Currently    Alcohol/week: 75.0 standard drinks of alcohol    Types: 75 Cans of beer per week   Drug use: Not Currently    Types: Marijuana, "Crack" cocaine    Comment: none in 15 yrs     MEDICATIONS    Home Medication:    Current Medication:  Current Facility-Administered Medications:    aspirin EC tablet 81 mg, 81 mg, Oral, Daily, Cox, Amy N, DO, 81 mg at 06/03/23 0840   budesonide (PULMICORT) nebulizer solution 0.25 mg, 0.25 mg, Nebulization, BID, Esaw Grandchild A, DO, 0.25 mg at 06/03/23 0759   clonazePAM (KLONOPIN) tablet 1 mg, 1 mg, Oral, QHS, Cox, Amy N, DO, 1 mg at 06/02/23 2118   clopidogrel (PLAVIX) tablet 75 mg, 75 mg, Oral, Daily, Cox, Amy N, DO, 75 mg at  06/03/23 0840   dextromethorphan-guaiFENesin (MUCINEX DM) 30-600 MG per 12 hr tablet 1 tablet, 1 tablet, Oral, BID, Esaw Grandchild A, DO, 1 tablet at 06/03/23 0840   diltiazem (CARDIZEM CD) 24 hr capsule 240 mg, 240 mg, Oral, Daily, Cox, Amy N, DO, 240 mg at 06/03/23 0840   enoxaparin (LOVENOX) injection 50 mg, 50 mg, Subcutaneous, Q24H, Cox, Amy N, DO, 50 mg at 06/02/23 2101   famotidine (PEPCID) tablet 20 mg, 20 mg, Oral, Daily, Cox, Amy N, DO, 20 mg at 06/03/23 0840   guaiFENesin-dextromethorphan (ROBITUSSIN DM) 100-10 MG/5ML syrup 5 mL, 5 mL, Oral, Q4H PRN, Esaw Grandchild A, DO, 5 mL at 05/30/23 1053   influenza vac split trivalent PF (FLULAVAL) injection 0.5 mL, 0.5 mL, Intramuscular, Tomorrow-1000, Griffith, Kelly A, DO   ipratropium-albuterol (DUONEB) 0.5-2.5 (3) MG/3ML nebulizer solution 3 mL, 3 mL, Nebulization, TID, Denton Lank, Kelly A, DO, 3 mL at 06/03/23 0800   mirtazapine (REMERON) tablet 30 mg, 30 mg, Oral, QHS, Cox, Amy N, DO, 30 mg at 06/02/23 2110   montelukast (SINGULAIR) tablet 10 mg, 10 mg, Oral, QHS, Cox, Amy N, DO, 10 mg at 06/02/23 2100   paliperidone (INVEGA) 24 hr tablet 9 mg, 9 mg, Oral, QHS, Cox, Amy N, DO, 9 mg at 06/02/23 2101   pravastatin (PRAVACHOL) tablet 20 mg, 20 mg, Oral, q1800, Cox, Amy N, DO, 20 mg at 06/02/23 1754   [COMPLETED] predniSONE (DELTASONE) tablet 40 mg, 40 mg, Oral, Q breakfast, 40 mg at 06/03/23 0834 **FOLLOWED BY** [START ON 06/04/2023] predniSONE (DELTASONE) tablet 30 mg, 30 mg, Oral, Q breakfast **FOLLOWED BY** [START ON 06/07/2023] predniSONE (DELTASONE) tablet 20 mg, 20 mg, Oral, Q breakfast **FOLLOWED BY** [START ON 06/10/2023] predniSONE (DELTASONE) tablet 10 mg, 10 mg, Oral, Q breakfast, [name], [name], MD   senna-docusate (Senokot-S) tablet 1 tablet, 1 tablet, Oral, QHS PRN, Cox, Amy N, DO, 1 tablet at 05/27/23 2103   traZODone (DESYREL) tablet 150 mg, 150 mg, Oral, QHS PRN, Cox, Amy N, DO, 150 mg at 06/02/23 2100   [DISCONTINUED]  fluticasone furoate-vilanterol (BREO ELLIPTA) 100-25 MCG/ACT 1 puff, 1 puff, Inhalation, Daily, 1 puff at 05/29/23 0903 **AND** umeclidinium bromide (INCRUSE ELLIPTA) 62.5 MCG/ACT 1 puff, 1 puff, Inhalation, Daily, Cox, Amy N, DO, 1 puff at 06/03/23 4098    ALLERGIES   Patient has no known allergies.     REVIEW OF SYSTEMS    Review of Systems:  Gen:  Denies  fever, sweats, chills weigh loss  HEENT: Denies blurred vision, double vision, ear pain, eye pain, hearing loss, nose bleeds, sore throat Cardiac:  No dizziness, chest pain or heaviness, chest tightness,edema Resp:   reports dyspnea chronically  Gi: Denies swallowing difficulty, stomach pain, nausea or vomiting, diarrhea, constipation, bowel incontinence Gu:  Denies bladder incontinence, burning urine Ext:   Denies Joint pain, stiffness or swelling Skin: Denies  skin rash, easy bruising or bleeding or hives Endoc:  Denies polyuria, polydipsia , polyphagia or weight change Psych:   Denies depression, insomnia or hallucinations   Other:  All other systems negative   VS: BP 111/70 (BP Location: Left Arm)   Pulse 73   Temp 98.1 F (36.7 C)   Resp 18   Ht 6' (1.829 m)   Wt 104.8 kg   SpO2 97%   BMI 31.33 kg/m      PHYSICAL EXAM    GENERAL:NAD, no fevers, chills, no weakness no fatigue HEAD: Normocephalic, atraumatic.  EYES: Pupils equal, round, reactive to light. Extraocular muscles intact. No scleral icterus.  MOUTH: Moist mucosal membrane. Dentition intact. No abscess noted.  EAR, NOSE, THROAT: Clear without exudates. No external lesions.  NECK: Supple. No thyromegaly. No nodules. No JVD.  PULMONARY: decreased breath sounds with mild rhonchi worse at bases bilaterally.  CARDIOVASCULAR: S1 and S2. Regular rate and rhythm. No murmurs, rubs, or gallops. No edema. Pedal pulses 2+ bilaterally.  GASTROINTESTINAL: Soft, nontender, nondistended. No masses. Positive bowel sounds. No hepatosplenomegaly.  MUSCULOSKELETAL:  No swelling, clubbing, or edema. Range of motion full in all extremities.  NEUROLOGIC: Cranial nerves II through XII are intact. No gross focal neurological deficits. Sensation intact. Reflexes intact.  SKIN: No ulceration, lesions, rashes, or cyanosis. Skin warm and dry. Turgor intact.  PSYCHIATRIC: Mood, affect within normal limits. The patient is awake, alert and oriented x 3. Insight, judgment intact.       IMAGING     ASSESSMENT/PLAN   Advanced COPD with Asthma Overlap syndrome   - patient currently on appropriate COPD care path   - agree with Prednisone PO taper    - continue nebulizer therapy with Duoneb TID   - continue pulmicort 0.25 bid   - continue incruse ellipta one puff daily   - Singulair 10mg  at bedtime  -PT/OT    Bilateral mucus plugging of bronchi   - non invasive modalities - agree with hypertonic saline   - duoneb  - flutter valve    Spiculated 13mm left upper lobe nodule  - patient has been treated Oct 2024 with radiation oncology s/p SBRT   Atelectasis   - incentive spirometry and physical therapy initially        Thank you for allowing me to participate in the care of this patient.   Patient/Family are satisfied with care plan and all questions have been answered.    Provider disclosure: Patient with at least one acute or chronic illness or injury that poses a threat to life or bodily function and is being managed actively during this encounter.  All of the below services have been performed independently by signing provider:  review of prior documentation from internal and or external health records.  Review of previous and current lab results.  Interview and comprehensive assessment during patient visit today. Review of current and previous chest radiographs/CT scans. Discussion of management and test interpretation with health care team and patient/family.   This document was prepared using Dragon voice recognition software and may include  unintentional dictation errors.     Vida Rigger, M.D.  Division of Pulmonary & Critical Care Medicine

## 2023-06-03 NOTE — TOC Progression Note (Signed)
Transition of Care [facility]) - Progression Note    Patient Details  Name: [name] MRN: [id number] Date of Birth: Nov 27, 1960  Transition of Care [facility]) CM/SW Contact  Hetty Ely, RN Phone Number: 06/03/2023, 3:40 PM  Clinical Narrative: CM called [facility] [phone] and every contact number on file in attempt to give report and discharge information, no answer and no voice mail. Will report off to covering CM to try calling tomorrow.    Expected Discharge Plan: Group Home Barriers to Discharge: Continued Medical Work up  Expected Discharge Plan and Services       Living arrangements for the past 2 months: Group Home Expected Discharge Date: 06/03/23                                     Social Determinants of Health (SDOH) Interventions SDOH Screenings   Food Insecurity: No Food Insecurity (05/29/2023)  Housing: Low Risk  (05/29/2023)  Transportation Needs: No Transportation Needs (05/29/2023)  Utilities: Not At Risk (05/29/2023)  Depression (PHQ2-9): Low Risk  (02/03/2023)  Tobacco Use: High Risk (05/25/2023)    Readmission Risk Interventions    05/26/2023    2:38 PM  Readmission Risk Prevention Plan  Transportation Screening Complete  PCP or Specialist Appt within 3-5 Days Complete  HRI or Home Care Consult Complete  Social Work Consult for Recovery Care Planning/Counseling Complete  Palliative Care Screening Not Applicable  Medication Review Oceanographer) Complete

## 2023-06-04 DIAGNOSIS — R6520 Severe sepsis without septic shock: Secondary | ICD-10-CM | POA: Diagnosis not present

## 2023-06-04 DIAGNOSIS — A419 Sepsis, unspecified organism: Secondary | ICD-10-CM | POA: Diagnosis not present

## 2023-06-04 NOTE — Plan of Care (Signed)
  Problem: Education: Goal: Knowledge of General Education information will improve Description: Including pain rating scale, medication(s)/side effects and non-pharmacologic comfort measures Outcome: Progressing   Problem: Health Behavior/Discharge Planning: Goal: Ability to manage health-related needs will improve Outcome: Progressing   Problem: Clinical Measurements: Goal: Will remain free from infection Outcome: Progressing Goal: Diagnostic test results will improve Outcome: Progressing Goal: Respiratory complications will improve Outcome: Progressing Goal: Cardiovascular complication will be avoided Outcome: Progressing   Problem: Activity: Goal: Risk for activity intolerance will decrease Outcome: Progressing   Problem: Nutrition: Goal: Adequate nutrition will be maintained Outcome: Progressing   Problem: Coping: Goal: Level of anxiety will decrease Outcome: Progressing   Problem: Safety: Goal: Ability to remain free from injury will improve Outcome: Progressing   Problem: Skin Integrity: Goal: Risk for impaired skin integrity will decrease Outcome: Progressing

## 2023-06-04 NOTE — TOC Progression Note (Signed)
Transition of Care [facility]) - Progression Note    Patient Details  Name: [name] MRN: [id number] Date of Birth: Aug 08, 1961  [facility] CM/SW Contact  Susa Simmonds, [place] Phone Number: 06/04/2023, 9:37 AM  Clinical Narrative:   CSW  called [facility] [phone]. CSW was told, Tammy the group home owner would have to come to the hospital and complete an assessment to determine if he can return. CSW was told Babette Relic will be there tomorrow but was not able to give CSW a time frame.     Expected Discharge Plan: Group Home Barriers to Discharge: Continued Medical Work up  Expected Discharge Plan and Services       Living arrangements for the past 2 months: Group Home Expected Discharge Date: 06/03/23                                     Social Determinants of Health (SDOH) Interventions SDOH Screenings   Food Insecurity: No Food Insecurity (05/29/2023)  Housing: Low Risk  (05/29/2023)  Transportation Needs: No Transportation Needs (05/29/2023)  Utilities: Not At Risk (05/29/2023)  Depression (PHQ2-9): Low Risk  (02/03/2023)  Tobacco Use: High Risk (05/25/2023)    Readmission Risk Interventions    05/26/2023    2:38 PM  Readmission Risk Prevention Plan  Transportation Screening Complete  PCP or Specialist Appt within 3-5 Days Complete  HRI or Home Care Consult Complete  Social Work Consult for Recovery Care Planning/Counseling Complete  Palliative Care Screening Not Applicable  Medication Review Oceanographer) Complete

## 2023-06-04 NOTE — Progress Notes (Signed)
Progress Note   Patient: [name] XBM:[id number] DOB: 1961-05-01 DOA: 05/25/2023     10 DOS: the patient was seen and examined on 06/04/2023   Brief hospital course:  HPI: [name] is a 62 year old male with history of depression, anxiety, CAD, hypertension, hyperlipidemia, who presents emergency department for chief concerns of shortness of breath.   Vitals in the ED showed temperature of 98.4, respiration rate of 26, heart rate of 119, blood pressure 153/90, SpO2 of 95% on 12 L high flow nasal cannula.   Serum sodium is 136, potassium 3.5, chloride 104, bicarb 24, BUN of 6, serum creatinine of 0.68, EGFR greater than 60, nonfasting blood glucose 120, WBC 3.0, hemoglobin 10.0, platelets of 147.   Lactic acid is 2.2, on repeat is 1.3.  High sensitive troponin was 8 and on repeat was 6.  UA was positive for trace leukocytes and positive for nitrates.  COVID/influenza A/influenza B/RSV PCR were negative.   ED treatment: DuoNebs x 2, Solu-Medrol 125 mg IV one-time dose, azithromycin 500 mg IV, ceftriaxone 2 g IV, LR 1 L bolus ------------------------------ At bedside, patient was able to tell me his name, age, current calendar year.   He has been having productive cough for 3 days. He denies known sick contacts.  He states that no one at the group home has been sick that he knows of.   He reports he is coughing up green mucus.  He denies blood, chest pain, abdominal pain, dysuria, hematuria, diarrhea, syncope.   Social history: He lives in a group home, [place]. He denies tobacco use, quitting  in 04/2023. Formerly, he smoked 1/3 ppd. He denies etoh and recreational drug use.         Assessment and Plan:  * Severe sepsis with acute organ dysfunction (HCC) Likely due to PNA.  UA abnormal but pt denies UTI symptoms.   Negative Covid / flu / RSV Presented with sepsis criteria of leukopenia, tachypnea, tachycardia, lactic acidosis consistent with organ involvement and  severe sepsis. --Initially on IV Rocephin & Zithromax and switched to oral Ceftin and Zithromax. -- Patient completed course of antibiotics on 10/21       COPD exacerbation (HCC) Likely due to RLL pneumonia. 10/22 -- pt minimally improved thus far despite typical COPD management.  Reviewed case and imaging with ICU Dr. 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 -- Patient was on IV steroids and has been transitioned to prednisone taper --Scheduled Duonebs and PRN albuterol --Pulmicort nebs -- Continue Incruse Ellipta --Continue Singulair at bedtime --IS & flutter for pulmonary hygiene --Appreciate pulmonary consult     Insomnia Continue home trazodone      Pulmonary nodule, right On CTA chest on admission: New small solid pulmonary nodule of the right upper lobe measuring 4 mm.  Follow-up with pulmonary as an outpatient   Adenocarcinoma of left lung (HCC) No acute issues. Patient is status post SBRT in Oct, 2024  Follow up with Oncology as scheduled   Nicotine dependence As needed nicotine patch    Schizophrenia (HCC) Continue home paliperidone    Essential hypertension Continue home diltiazem           Subjective: Sleeping but arouses easily.  No audible wheezes  Physical Exam: Vitals:   06/04/23 0737 06/04/23 0744 06/04/23 1225 06/04/23 1303  BP: 101/66  (!) 140/87   Pulse: 79  85   Resp:  18  20   Temp: 98.8 F (37.1 C)  98.9 F (37.2 C)   TempSrc: Oral  Oral   SpO2: 90% 97% 97% 96%  Weight:      Height:       General exam: awake, alert, no acute distress HEENT: moist mucus membranes, hearing grossly normal, dentures loose Respiratory system: Improved high-pitched expiratory wheezes worse on the right than left, on room air, normal respiratory effort.  Ongoing coarse-sounding cough. Cardiovascular system: normal S1/S2, RRR, , no pedal edema.    Gastrointestinal system: soft, NT, ND Central nervous system: A&O x2+. no gross focal neurologic deficits, normal speech Extremities: moves all, no edema, normal tone Psychiatry: normal mood, flat affect      Data Reviewed:  There are no new results to review at this time.  Family Communication: None  Disposition: Status is: Inpatient Remains inpatient appropriate because: Awaiting discharge  Planned Discharge Destination: Home    Time spent: 30  minutes  Author: Lucile Shutters, MD 06/04/2023 1:12 PM  For on call review [email or web address].

## 2023-06-04 NOTE — Progress Notes (Signed)
Physical Therapy Treatment Patient Details Name: [name] MRN: [id number] DOB: Jun 25, 1961 Today's Date: 06/04/2023   History of Present Illness presented to ER Secondary to SOB; admitted for management of sepsis related to ?UTI vs R LL PNA?    PT Comments  Pt ready for session in recliner. Stands and completes x 1 lap and stops in bathroom to void.  Stated he does not use RW at baseline and does not feel he needs it now.  He is generally unsteady but no formal LOB's.  While he does not need RW inside, he may benefit from one for outside and community ambulation but unsure if he will use it.  Remained in recliner after session and declined further gait.   If plan is discharge home, recommend the following: A little help with walking and/or transfers;A little help with bathing/dressing/bathroom;Help with stairs or ramp for entrance;Assist for transportation   Can travel by private vehicle        Equipment Recommendations       Recommendations for Other Services       Precautions / Restrictions Precautions Precautions: Fall Restrictions Weight Bearing Restrictions: No     Mobility  Bed Mobility                 Patient Response: Cooperative  Transfers Overall transfer level: Needs assistance Equipment used: None Transfers: Sit to/from Stand Sit to Stand: Supervision                Ambulation/Gait Ambulation/Gait assistance: Contact guard assist, Supervision Gait Distance (Feet): 200 Feet Assistive device: None Gait Pattern/deviations: Step-through pattern Gait velocity: [facility]         Stairs             Wheelchair Mobility     Tilt Bed Tilt Bed Patient Response: Cooperative  Modified Rankin (Stroke Patients Only)       Balance Overall balance assessment: Needs assistance Sitting-balance support: No upper extremity supported, Feet supported Sitting balance-Leahy Scale: Normal     Standing balance support: No upper extremity  supported Standing balance-Leahy Scale: Good                              Cognition Arousal: Alert Behavior During Therapy: WFL for tasks assessed/performed Overall Cognitive Status: Within Functional Limits for tasks assessed                                          Exercises      General Comments        Pertinent Vitals/Pain Pain Assessment Pain Assessment: No/denies pain    Home Living                          Prior Function            PT Goals (current goals can now be found in the care plan section) Progress towards PT goals: Progressing toward goals    Frequency    Min 1X/week      PT Plan      Co-evaluation              AM-PAC PT "6 Clicks" Mobility   Outcome Measure  Help needed turning from your back to your side while in a flat bed without using bedrails?: None Help needed moving  from lying on your back to sitting on the side of a flat bed without using bedrails?: None Help needed moving to and from a bed to a chair (including a wheelchair)?: None Help needed standing up from a chair using your arms (e.g., wheelchair or bedside chair)?: None Help needed to walk in hospital room?: A Little Help needed climbing 3-5 steps with a railing? : A Little 6 Click Score: 22    End of Session Equipment Utilized During Treatment: Gait belt Activity Tolerance: Patient tolerated treatment well Patient left: with call bell/phone within reach;in chair;with chair alarm set Nurse Communication: Mobility status PT Visit Diagnosis: Muscle weakness (generalized) (M62.81);Difficulty in walking, not elsewhere classified (R26.2)     Time: 1610-9604 PT Time Calculation (min) (ACUTE ONLY): 9 min  Charges:    $Gait Training: 8-22 mins PT General Charges $$ ACUTE PT VISIT: 1 Visit                   Danielle Dess, PTA 06/04/23, 2:56 PM

## 2023-06-04 NOTE — Progress Notes (Signed)
Progress Note   Patient: [name] [id number] DOB: 10-Dec-1960 DOA: 05/25/2023     10 DOS: the patient was seen and examined on 06/04/2023   Brief hospital course: HPI: Mr. Asser Dail is a 62 year old male with history of depression, anxiety, CAD, hypertension, hyperlipidemia, who presents emergency department for chief concerns of shortness of breath.   Vitals in the ED showed temperature of 98.4, respiration rate of 26, heart rate of 119, blood pressure 153/90, SpO2 of 95% on 12 L high flow nasal cannula.   Serum sodium is 136, potassium 3.5, chloride 104, bicarb 24, BUN of 6, serum creatinine of 0.68, EGFR greater than 60, nonfasting blood glucose 120, WBC 3.0, hemoglobin 10.0, platelets of 147.   Lactic acid is 2.2, on repeat is 1.3.  High sensitive troponin was 8 and on repeat was 6.  UA was positive for trace leukocytes and positive for nitrates.  COVID/influenza A/influenza B/RSV PCR were negative.   ED treatment: DuoNebs x 2, Solu-Medrol 125 mg IV one-time dose, azithromycin 500 mg IV, ceftriaxone 2 g IV, LR 1 L bolus ------------------------------ At bedside, patient was able to tell me his name, age, current calendar year.   He has been having productive cough for 3 days. He denies known sick contacts.  He states that no one at the group home has been sick that he knows of.   He reports he is coughing up green mucus.  He denies blood, chest pain, abdominal pain, dysuria, hematuria, diarrhea, syncope.   Social history: He lives in a group home, [facility]. He denies tobacco use, quitting  in 04/2023. Formerly, he smoked 1/3 ppd. He denies etoh and recreational drug use.      Assessment and Plan:  * Severe sepsis with acute organ dysfunction (HCC) Likely due to PNA.  UA abnormal but pt denies UTI symptoms.   Negative Covid / flu / RSV Presented with sepsis criteria of leukopenia, tachypnea, tachycardia, lactic acidosis consistent with organ involvement and severe  sepsis. --Initially on IV Rocephin & Zithromax and switched to oral Ceftin and Zithromax. -- Patient completed course of antibiotics on 10/21       COPD exacerbation (HCC) Likely due to RLL pneumonia. 10/22 -- pt minimally improved thus far despite typical COPD management.  Reviewed case and imaging with ICU Dr. 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 -- Patient was on IV steroids and has been transitioned to prednisone taper --Scheduled Duonebs and PRN albuterol --Pulmicort nebs -- Continue Incruse Ellipta --Continue Singulair at bedtime --IS & flutter for pulmonary hygiene --Appreciate pulmonary consult     Insomnia Continue home trazodone      Pulmonary nodule, right On CTA chest on admission: New small solid pulmonary nodule of the right upper lobe measuring 4 mm.  Follow-up with pulmonary as an outpatient   Adenocarcinoma of left lung (HCC) No acute issues. Patient is status post SBRT in Oct, 2024  Follow up with Oncology as scheduled   Nicotine dependence As needed nicotine patch    Schizophrenia (HCC) Continue home paliperidone    Essential hypertension Continue home diltiazem       Subjective: Patient is seen and examined at the bedside.  Discharge was held because staff from the group home were unable to come and get him.  No new complaints  Physical Exam: Vitals:   06/04/23 0737 06/04/23 0744 06/04/23 1225 06/04/23 1303  BP:  101/66  (!) 140/87   Pulse: 79  85   Resp: 18  20   Temp: 98.8 F (37.1 C)  98.9 F (37.2 C)   TempSrc: Oral  Oral   SpO2: 90% 97% 97% 96%  Weight:      Height:       General exam: awake, alert, no acute distress HEENT: moist mucus membranes, hearing grossly normal, dentures loose Respiratory system: Improved high-pitched expiratory wheezes worse on the right than left, on room air, normal respiratory effort.  Ongoing  coarse-sounding cough. Cardiovascular system: normal S1/S2, RRR, , no pedal edema.   Gastrointestinal system: soft, NT, ND Central nervous system: A&O x2+. no gross focal neurologic deficits, normal speech Extremities: moves all, no edema, normal tone Psychiatry: normal mood, flat affect      Data Reviewed:  There are no new results to review at this time.  Family Communication: None  Disposition: Status is: Inpatient Remains inpatient appropriate because: Awaiting discharge  Planned Discharge Destination: Home    Time spent: 30 minutes  Author: Lucile Shutters, MD 06/04/2023 1:08 PM  For on call review [email or web address].

## 2023-06-05 ENCOUNTER — Other Ambulatory Visit: Payer: Self-pay

## 2023-06-05 ENCOUNTER — Ambulatory Visit: Payer: 59

## 2023-06-05 DIAGNOSIS — R6520 Severe sepsis without septic shock: Secondary | ICD-10-CM | POA: Diagnosis not present

## 2023-06-05 DIAGNOSIS — A419 Sepsis, unspecified organism: Secondary | ICD-10-CM | POA: Diagnosis not present

## 2023-06-05 LAB — RAD ONC ARIA SESSION SUMMARY
Course Elapsed Days: 42
Plan Fractions Treated to Date: 28
Plan Prescribed Dose Per Fraction: 2 Gy
Plan Total Fractions Prescribed: 35
Plan Total Prescribed Dose: 70 Gy
Reference Point Dosage Given to Date: 56 Gy
Reference Point Session Dosage Given: 2 Gy
Session Number: 28

## 2023-06-05 MED ORDER — IPRATROPIUM-ALBUTEROL 0.5-2.5 (3) MG/3ML IN SOLN: 3.0000 mL | Freq: Two times a day (BID) | RESPIRATORY_TRACT | Status: DC

## 2023-06-05 NOTE — Progress Notes (Signed)
Discharge instructions given to and reviewed with patient. Patient verbalized understanding of all discharge instructions including follow up care and medications. RN called and spoke with [name] from [facility] and asked about him picking patient up for discharge. [name] stated that Onalee Hua from the group home is at the hospital now to pick him up and that Sakae will know Onalee Hua. RN instructed [name] that the discharge instructions will be sent with Onalee Hua and patient. [name] verbalized understanding.

## 2023-06-05 NOTE — Plan of Care (Signed)

## 2023-06-05 NOTE — TOC Transition Note (Signed)
Transition of Care [facility]) - CM/SW Discharge Note   Patient Details  Name: [name] MRN: [id number] Date of Birth: [birth year]  [facility] CM/SW Contact:  Darolyn Rua, LCSW Phone Number: 06/05/2023, 2:24 PM   Clinical Narrative:     [facility], [name] to pick patient up in 20 min RN made aware [phone]).   [facility] [name] had visited patient at bedside to complete assessment this morning, confirmed he was aware patient was ready to discharge last Friday. Group home has consistently refused to pick patient up today, reporting that patient wanted to stay in hospital a few more days. CSW has reiterated multiple times patient is ready for dc, dc summary and orders are in.  [name] then reported he will be to hospital in 20 min to pick patient up. No discharge needs. Cheryl with [facility] updated.   Final next level of care: Group Home Barriers to Discharge: No Barriers Identified   Patient Goals and CMS Choice CMS Medicare.gov Compare Post Acute Care list provided to:: Patient Choice offered to / list presented to : Patient  Discharge Placement                         Discharge Plan and Services Additional resources added to the After Visit Summary for                                       Social Determinants of Health (SDOH) Interventions SDOH Screenings   Food Insecurity: No Food Insecurity (05/29/2023)  Housing: Low Risk  (05/29/2023)  Transportation Needs: No Transportation Needs (05/29/2023)  Utilities: Not At Risk (05/29/2023)  Depression (PHQ2-9): Low Risk  (02/03/2023)  Tobacco Use: High Risk (05/25/2023)     Readmission Risk Interventions    05/26/2023    2:38 PM  Readmission Risk Prevention Plan  Transportation Screening Complete  PCP or Specialist Appt within 3-5 Days Complete  HRI or Home Care Consult Complete  Social Work Consult for Recovery Care Planning/Counseling Complete   Palliative Care Screening Not Applicable  Medication Review [employer]) Complete

## 2023-06-05 NOTE — Progress Notes (Signed)
RN called and left voice mail for [name], group home manager to let him know that [facility] is in [facility] for [facility] at this time. RN asked [name] to call back about discharge pick up on voicemail.

## 2023-06-05 NOTE — Progress Notes (Signed)
Called [name] again to let him know patient is back from radiation and ready to be discharged. [name] hung up without phone going to voicemail and without answering the phone. Phone only rang multiple times and then a hang up. [name] did not call the nurses station back after prior voicemail left by this RN at [phone].

## 2023-06-05 NOTE — Progress Notes (Signed)
Patient refused flu vaccine stating he already got the flu vaccine at [facility] this fall.

## 2023-06-06 ENCOUNTER — Other Ambulatory Visit: Payer: Self-pay

## 2023-06-06 ENCOUNTER — Ambulatory Visit
Admission: RE | Admit: 2023-06-06 | Discharge: 2023-06-06 | Disposition: A | Discharge status: Home / Self Care | Payer: 59 | Source: Ambulatory Visit | Attending: Radiation Oncology | Admitting: Radiation Oncology

## 2023-06-06 DIAGNOSIS — Z510 Encounter for antineoplastic radiation therapy: Secondary | ICD-10-CM | POA: Diagnosis not present

## 2023-06-06 LAB — RAD ONC ARIA SESSION SUMMARY
Course Elapsed Days: 43
Plan Fractions Treated to Date: 29
Plan Prescribed Dose Per Fraction: 2 Gy
Plan Total Fractions Prescribed: 35
Plan Total Prescribed Dose: 70 Gy
Reference Point Dosage Given to Date: 58 Gy
Reference Point Session Dosage Given: 2 Gy
Session Number: 29

## 2023-06-07 ENCOUNTER — Other Ambulatory Visit: Payer: Self-pay

## 2023-06-07 ENCOUNTER — Ambulatory Visit
Admission: RE | Admit: 2023-06-07 | Discharge: 2023-06-07 | Disposition: A | Discharge status: Home / Self Care | Payer: 59 | Source: Ambulatory Visit | Attending: Radiation Oncology | Admitting: Radiation Oncology

## 2023-06-07 ENCOUNTER — Ambulatory Visit: Payer: 59

## 2023-06-07 DIAGNOSIS — Z510 Encounter for antineoplastic radiation therapy: Secondary | ICD-10-CM | POA: Diagnosis not present

## 2023-06-07 LAB — RAD ONC ARIA SESSION SUMMARY
Course Elapsed Days: 44
Plan Fractions Treated to Date: 30
Plan Prescribed Dose Per Fraction: 2 Gy
Plan Total Fractions Prescribed: 35
Plan Total Prescribed Dose: 70 Gy
Reference Point Dosage Given to Date: 60 Gy
Reference Point Session Dosage Given: 2 Gy
Session Number: 30

## 2023-06-08 ENCOUNTER — Encounter: Payer: Self-pay | Admitting: Oncology

## 2023-06-08 ENCOUNTER — Other Ambulatory Visit: Payer: Self-pay

## 2023-06-08 ENCOUNTER — Inpatient Hospital Stay: Payer: 59 | Admitting: Oncology

## 2023-06-08 ENCOUNTER — Inpatient Hospital Stay: Payer: 59

## 2023-06-08 ENCOUNTER — Ambulatory Visit: Payer: 59

## 2023-06-08 ENCOUNTER — Ambulatory Visit
Admission: RE | Admit: 2023-06-08 | Discharge: 2023-06-08 | Disposition: A | Discharge status: Home / Self Care | Payer: 59 | Source: Ambulatory Visit | Attending: Radiation Oncology | Admitting: Radiation Oncology

## 2023-06-08 VITALS — BP 127/90 | HR 107 | Temp 97.2°F | Resp 20 | Wt 229.6 lb

## 2023-06-08 DIAGNOSIS — C3492 Malignant neoplasm of unspecified part of left bronchus or lung: Secondary | ICD-10-CM

## 2023-06-08 DIAGNOSIS — Z510 Encounter for antineoplastic radiation therapy: Secondary | ICD-10-CM | POA: Diagnosis not present

## 2023-06-08 DIAGNOSIS — Z5111 Encounter for antineoplastic chemotherapy: Secondary | ICD-10-CM

## 2023-06-08 DIAGNOSIS — Z720 Tobacco use: Secondary | ICD-10-CM | POA: Diagnosis not present

## 2023-06-08 DIAGNOSIS — E876 Hypokalemia: Secondary | ICD-10-CM | POA: Diagnosis not present

## 2023-06-08 LAB — CMP (CANCER CENTER ONLY)
ALT: 28 U/L (ref 0–44)
AST: 23 U/L (ref 15–41)
Albumin: 3.5 g/dL (ref 3.5–5.0)
Alkaline Phosphatase: 52 U/L (ref 38–126)
Anion gap: 10 (ref 5–15)
BUN: 12 mg/dL (ref 8–23)
CO2: 25 mmol/L (ref 22–32)
Calcium: 9.2 mg/dL (ref 8.9–10.3)
Chloride: 105 mmol/L (ref 98–111)
Creatinine: 0.69 mg/dL (ref 0.61–1.24)
GFR, Estimated: >60 mL/min (ref >60)
Glucose, Bld: 117 mg/dL — ABNORMAL HIGH (ref 70–99)
Potassium: 3.3 mmol/L — ABNORMAL LOW (ref 3.5–5.1)
Sodium: 140 mmol/L (ref 135–145)
Total Bilirubin: 0.6 mg/dL (ref 0.3–1.2)
Total Protein: 6.3 g/dL — ABNORMAL LOW (ref 6.5–8.1)

## 2023-06-08 LAB — RAD ONC ARIA SESSION SUMMARY
Course Elapsed Days: 45
Plan Fractions Treated to Date: 31
Plan Prescribed Dose Per Fraction: 2 Gy
Plan Total Fractions Prescribed: 35
Plan Total Prescribed Dose: 70 Gy
Reference Point Dosage Given to Date: 62 Gy
Reference Point Session Dosage Given: 2 Gy
Session Number: 31

## 2023-06-08 LAB — CBC WITH DIFFERENTIAL (CANCER CENTER ONLY)
Abs Immature Granulocytes: 0.08 10*3/uL — ABNORMAL HIGH (ref 0.00–0.07)
Basophils Absolute: 0 10*3/uL (ref 0.0–0.1)
Basophils Relative: 0 %
Eosinophils Absolute: 0 10*3/uL (ref 0.0–0.5)
Eosinophils Relative: 0 %
HCT: 35.1 % — ABNORMAL LOW (ref 39.0–52.0)
Hemoglobin: 11.5 g/dL — ABNORMAL LOW (ref 13.0–17.0)
Immature Granulocytes: 1 %
Lymphocytes Relative: 5 %
Lymphs Abs: 0.6 10*3/uL — ABNORMAL LOW (ref 0.7–4.0)
MCH: 31.2 pg (ref 26.0–34.0)
MCHC: 32.8 g/dL (ref 30.0–36.0)
MCV: 95.1 fL (ref 80.0–100.0)
Monocytes Absolute: 0.5 10*3/uL (ref 0.1–1.0)
Monocytes Relative: 5 %
Neutro Abs: 9.7 10*3/uL — ABNORMAL HIGH (ref 1.7–7.7)
Neutrophils Relative %: 89 %
Platelet Count: 161 10*3/uL (ref 150–400)
RBC: 3.69 MIL/uL — ABNORMAL LOW (ref 4.22–5.81)
RDW: 17.6 % — ABNORMAL HIGH (ref 11.5–15.5)
WBC Count: 10.9 10*3/uL — ABNORMAL HIGH (ref 4.0–10.5)
nRBC: 0 % (ref 0.0–0.2)

## 2023-06-08 MED ORDER — SODIUM CHLORIDE 0.9 % IV SOLN
Freq: Once | INTRAVENOUS | Status: AC
  Filled 2023-06-08: qty 250

## 2023-06-08 MED ORDER — SODIUM CHLORIDE 0.9 % IV SOLN
45.0000 mg/m2 | Freq: Once | INTRAVENOUS | Status: AC
  Administered 2023-06-08: 102 mg via INTRAVENOUS
  Filled 2023-06-08: qty 17

## 2023-06-08 MED ORDER — PALONOSETRON HCL INJECTION 0.25 MG/5ML
0.2500 mg | Freq: Once | INTRAVENOUS | Status: AC
  Administered 2023-06-08: 0.25 mg via INTRAVENOUS
  Filled 2023-06-08: qty 5

## 2023-06-08 MED ORDER — FAMOTIDINE IN NACL 20-0.9 MG/50ML-% IV SOLN
20.0000 mg | Freq: Once | INTRAVENOUS | Status: AC
  Administered 2023-06-08: 20 mg via INTRAVENOUS
  Filled 2023-06-08: qty 50

## 2023-06-08 MED ORDER — DEXAMETHASONE SODIUM PHOSPHATE 10 MG/ML IJ SOLN
10.0000 mg | Freq: Once | INTRAMUSCULAR | Status: AC
  Administered 2023-06-08: 10 mg via INTRAVENOUS
  Filled 2023-06-08: qty 1

## 2023-06-08 MED ORDER — DIPHENHYDRAMINE HCL 50 MG/ML IJ SOLN
50.0000 mg | Freq: Once | INTRAMUSCULAR | Status: AC
  Administered 2023-06-08: 50 mg via INTRAVENOUS
  Filled 2023-06-08: qty 1

## 2023-06-08 MED ORDER — POTASSIUM CHLORIDE CRYS ER 20 MEQ PO TBCR: 20.0000 meq | EXTENDED_RELEASE_TABLET | Freq: Every day | ORAL | 0 refills | Status: DC

## 2023-06-08 MED ORDER — CARBOPLATIN CHEMO INJECTION 450 MG/45ML
300.0000 mg | Freq: Once | INTRAVENOUS | Status: AC
  Administered 2023-06-08: 300 mg via INTRAVENOUS
  Filled 2023-06-08: qty 30

## 2023-06-08 NOTE — Assessment & Plan Note (Signed)
Chemotherapy plan as listed above 

## 2023-06-08 NOTE — Assessment & Plan Note (Signed)
Encourage his cessation effort. - stopped in early Sept.

## 2023-06-08 NOTE — Assessment & Plan Note (Signed)
potassium chloride daily x 3 days.

## 2023-06-08 NOTE — Progress Notes (Signed)
Hematology/Oncology Progress note [phone] [phone]        REFERRING PROVIDER: [name], MD    CHIEF COMPLAINTS/PURPOSE OF CONSULTATION:  Stage III Lung cancer  ASSESSMENT & PLAN:   Cancer Staging  Adenocarcinoma of left lung [facility]) Staging form: Lung, AJCC 8th Edition - Clinical stage from 03/22/2023: cT1, cN3, cM0 - Signed by [name], MD on 03/22/2023   Adenocarcinoma of left lung (HCC) Clinically he has Stage III left lung cancer He has right paratracheal lymph node hypermetabolic activity, although Station 7 lymph node showed atypical cell, no definite malignancy. Left upper lobe adenocarcinoma, No enough tissue for NGS, will send off liquid biopsy- send off liquid biopsy.  Labs are reviewed and discussed with patient. Proceed with carboplatin AUC 2 and taxol 45mg /m2 today. This is his last chemotherapy. He will finish RT next week.  Plan repeat CT chest 4 weeks after RT, if stable disease plan maintenance Durvalumab.   Hypokalemia  potassium chloride daily x 3 days.   Schizophrenia (HCC) Patient lives in group home.  Continue follow-up with primary care provider. On Seroquel and mirtazapine   Tobacco use Encourage his cessation effort. - stopped in early Sept.   Encounter for antineoplastic chemotherapy Chemotherapy plan as listed above.    Orders Placed This Encounter  Procedures   CT Chest W Contrast    Standing Status:   Future    Standing Expiration Date:   06/07/2024    Order Specific Question:   If indicated for the ordered procedure, I authorize the administration of contrast media per Radiology protocol    Answer:   Yes    Order Specific Question:   Does the patient have a contrast media/X-ray dye allergy?    Answer:   No    Order Specific Question:   Preferred imaging location?    Answer:   [facility]   Follow up 5 weeks. Lab MD Durvalumab All questions were answered. The patient knows to call the clinic with  any problems, questions or concerns.  [name], MD, PhD [facility] Hematology Oncology 06/08/2023    HISTORY OF PRESENTING ILLNESS:  [name] 62 y.o. male presents to establish care for lung mass I have reviewed his chart and materials related to his cancer extensively and collaborated history with the patient. Summary of oncologic history is as follows:  12/08/2021 CT chest with contrast showed irregular solid pulmonary nodule of right upper lobe, increased in size.  Bibasilar consolidation.  Mildly enlarged right lower paratracheal lymph node.  Atherosclerosis. 01/04/2022 PET scan showed 10 mm irregular nodule in the lateral right upper lobe, suspicious for primary bronchogenic neoplasm. 2 small right paratracheal nodes, indeterminate and favored to be reactive.  Attention on follow-up.  Patient was seen by radiation oncology Dr. Rushie Chestnut.  Patient was offered SBRT to the right upper lobe nodule/presumed non-small cell lung cancer stage I.  Patient lives in a group home.  He reports chronic cough.  Some shortness of breath with exertion. He is a current everyday smoker.  Few cigarettes per day.  He denies any unintentional weight loss, night sweats, fever or chills. Patient is on aspirin and Plavix   INTERVAL HISTORY [name] is a 62 y.o. male who has above history reviewed by me today presents for follow up visit for lung cancer.  Oncology History  Adenocarcinoma of left lung (HCC)  12/29/2022 Imaging   CT chest with contrast showed Stable radiation changes involving the right upper lobe  with a small residual treated nodule.  Decrease in size.  Interval enlargement of the left upper lobe pulmonary nodule.  Stable mediastinal nodes stable advanced emphysematous changes and areas of pulmonary scarring.  Stable bilateral adrenal gland adenomas.  Emphysema   01/30/2023 Imaging   PET 1. Recurrent bronchogenic carcinoma in the left upper lobe with new and increasingly hypermetabolic  mediastinal lymph nodes. No evidence of distant metastatic disease. 2. Possible mild prostate hypermetabolism. Consider laboratory correlation. 3. Liver is mildly heterogeneous, raising suspicion for steatosis. 4. Cholelithiasis. 5. Right adrenal adenoma.  Left adrenal myelolipoma. 6. Aortic atherosclerosis (ICD10-I70.0). Coronary artery calcification. 7. Enlarged pulmonic trunk, indicative of pulmonary arterial hypertension.   03/15/2023 Initial Diagnosis   Adenocarcinoma of left lung   Bronchoscopy showed  Lung, biopsy, Left upper lobe - NON-SMALL CELL CARCINOMA, FAVOR ADENOCARCINOMA.  1. Bronchial Lavage, Left upper lobe - POSITIVE FOR MALIGNANCY. - NON-SMALL CELL CARCINOMA, FAVOR ADENOCARCINOMA. - SEE NOTE. 2. Bronchial Brushing, Left upper lobe - POSITIVE FOR MALIGNANCY. - NON-SMALL CELL CARCINOMA, FAVOR ADENOCARCINOMA. 3. Bronchus, biopsy, left upper lobe - SEE [id number] 4. Fine Needle Aspiration, left upper lobe - POSITIVE FOR MALIGNANCY. - NON-SMALL CELL CARCINOMA, FAVOR ADENOCARCINOMA. 5. Fine Needle Aspiration, station 7 lymph node - ATYPICAL. - BACKGROUND LYMPHOID TISSUE IS PRESENT CONSISTENT WITH LYMPH NODE SAMPLING.    03/15/2023 Procedure   Fiberoptic bronchoscopy with airway inspection and BAL Procedure findings:   Bronchoscope was inserted via ETT  without difficulty.  Posterior oropharynx, epiglottis, arytenoids, false cords and vocal cords were not visualized as these were bypassed by endotracheal tube. The distal trachea was normal in circumference and appearance without mucosal, cartilaginous or branching abnormalities.  The main carina was mildly splayed . All right and left lobar airways were visualized to the Subsegmental level.  Sub- sub segmental carinae were identified in all the distal airways.   Secretions were visible in the following airways and appeared to be clear.  The mucosa was : friable at LEFT UPPER LOBE   Airways were notable for:         exophytic lesions :n       extrinsic compression in the following distributions: n.       Friable mucosa: y       Teacher, music /pigmentation: n   MUCUS PLUGGING WAS SEVERE ON LEFT SIDE AND COMPLETE OBSTRUCTED MULTIPLE AIRWAYS.  UNABLE TO NAVIGATE THROUGH TRACHEOBRONCHIAL TREE DUE TO SEVERE MUCUS PLUGGING. THERAPEUTIC ASPIRATION WAS PERFORMED X 7 AND WAS DONE BILATERALLY ALTHOUGH WORSE ON LEFT.  BAL WAS PERFORMED AT LEFT UPPER LOBE X 2.     Endobronchial ultrasound assisted hilar and mediastinal lymph node biopsies procedure findings: The fiberoptic bronchoscope was removed and the EBUS scope was introduced. Examination began to evaluate for pathologically enlarged lymph nodes starting on the RIGHT  side progressing to the LEFT side.  All lymph node biopsies performed with 21G  needle. Lymph node biopsies were sent in cytolite for all stations.   STATION 10R - 6mm not biopsied STATION 7 - 1.2cm biopsied 3 times STATION 10L - 5mm not biopsied  STATION 4L - 6mm not biopsied    03/22/2023 Cancer Staging   Staging form: Lung, AJCC 8th Edition - Clinical stage from 03/22/2023: cT1, cN3, cM0 - Signed by [name], MD on 03/22/2023 Stage prefix: Initial diagnosis   04/20/2023 -  Chemotherapy   Patient is on Treatment Plan : LUNG Carboplatin + Paclitaxel + XRT q7d        Patient presents for  evaluation prior to chemotherapy. He reports feel well He was recently hospitalized due to pneumonia, and COPD exacerbation. Finished antibiotics on 10/21. He is still on tapering course of steroid.  No new complaints. Breathing is not worse than his baseline.     MEDICAL HISTORY:  Past Medical History:  Diagnosis Date   Acute on chronic respiratory failure with hypoxia (HCC)    Asthma    Coagulation disorder (HCC)    COPD (chronic obstructive pulmonary disease) (HCC)    Depression    History of hiatal hernia    Hypertension    Hypokalemia    Leukocytosis    Lung mass    Pneumonia     Schizophrenia (HCC)    Shortness of breath dyspnea    Stroke [facility])    Umbilical hernia    Ventral hernia     SURGICAL HISTORY: Past Surgical History:  Procedure Laterality Date   COLONOSCOPY WITH PROPOFOL N/A 09/21/2021   Procedure: COLONOSCOPY WITH PROPOFOL;  Surgeon: Regis Bill, MD;  Location: [facility] ENDOSCOPY;  Service: Endoscopy;  Laterality: N/A;   HERNIA REPAIR     INSERTION OF MESH N/A 12/18/2014   Procedure: INSERTION OF MESH;  Surgeon: Lattie Haw, MD;  Location: [facility] ORS;  Service: General;  Laterality: N/A;   SUPRA-UMBILICAL HERNIA  12/18/2014   Procedure: SUPRA-UMBILICAL HERNIA;  Surgeon: Lattie Haw, MD;  Location: [facility] ORS;  Service: General;;   UMBILICAL HERNIA REPAIR N/A 12/18/2014   Procedure: HERNIA REPAIR UMBILICAL ADULT;  Surgeon: Lattie Haw, MD;  Location: [facility] ORS;  Service: General;  Laterality: N/A;   VIDEO BRONCHOSCOPY WITH ENDOBRONCHIAL ULTRASOUND N/A 03/15/2023   Procedure: VIDEO BRONCHOSCOPY WITH ENDOBRONCHIAL ULTRASOUND;  Surgeon: Vida Rigger, MD;  Location: [facility] ORS;  Service: Thoracic;  Laterality: N/A;    SOCIAL HISTORY: Social History   Socioeconomic History   Marital status: Widowed    Spouse name: Not on file   Number of children: Not on file   Years of education: Not on file   Highest education level: Not on file  Occupational History   Not on file  Tobacco Use   Smoking status: Every Day    Current packs/day: 0.15    Average packs/day: 0.2 packs/day for 45.0 years (6.8 ttl pk-yrs)    Types: Cigarettes   Smokeless tobacco: Never  Vaping Use   Vaping status: Never Used  Substance and Sexual Activity   Alcohol use: Not Currently    Alcohol/week: 75.0 standard drinks of alcohol    Types: 75 Cans of beer per week   Drug use: Not Currently    Types: Marijuana, "Crack" cocaine    Comment: none in 15 yrs   Sexual activity: Not Currently  Other Topics Concern   Not on file  Social History Narrative   Not on file    Social Determinants of Health   Financial Resource Strain: Not on file  Food Insecurity: No Food Insecurity (05/29/2023)   Hunger Vital Sign    Worried About Running Out of Food in the Last Year: Never true    Ran Out of Food in the Last Year: Never true  Transportation Needs: No Transportation Needs (05/29/2023)   PRAPARE - Administrator, Civil Service (Medical): No    Lack of Transportation (Non-Medical): No  Physical Activity: Not on file  Stress: Not on file  Social Connections: Not on file  Intimate Partner Violence: Not At Risk (05/29/2023)   Humiliation, Afraid, Rape, and Kick questionnaire  Fear of Current or Ex-Partner: No    Emotionally Abused: No    Physically Abused: No    Sexually Abused: No    FAMILY HISTORY: Family History  Problem Relation Age of Onset   Asthma Mother    Hypertension Father     ALLERGIES:  has No Known Allergies.  MEDICATIONS:  Current Outpatient Medications  Medication Sig Dispense Refill   albuterol (VENTOLIN HFA) 108 (90 Base) MCG/ACT inhaler Inhale 2 puffs into the lungs every 4 (four) hours as needed for wheezing or shortness of breath.     aspirin EC 81 MG tablet Take 81 mg by mouth daily. Swallow whole.     clonazePAM (KLONOPIN) 1 MG tablet Take 1 tablet (1 mg total) by mouth at bedtime. 30 tablet 0   clopidogrel (PLAVIX) 75 MG tablet Take 1 tablet (75 mg total) by mouth daily. 30 tablet 3   dextromethorphan-guaiFENesin (ROBITUSSIN-DM) 10-100 MG/5ML liquid Take 10 mLs by mouth every 6 (six) hours as needed for cough.     diltiazem (CARDIZEM CD) 240 MG 24 hr capsule Take 1 capsule (240 mg total) by mouth daily. 30 capsule 3   docusate sodium (COLACE) 100 MG capsule Take 100 mg by mouth 2 (two) times daily as needed for mild constipation.     famotidine (PEPCID) 20 MG tablet Take 1 tablet (20 mg total) by mouth daily. 30 tablet 0   Fluticasone-Umeclidin-Vilant (TRELEGY ELLIPTA) 100-62.5-25 MCG/ACT AEPB Inhale 1 puff  into the lungs daily. 28 each 1   guaiFENesin (MUCINEX) 600 MG 12 hr tablet Take 600 mg by mouth 2 (two) times daily as needed.     ipratropium-albuterol (DUONEB) 0.5-2.5 (3) MG/3ML SOLN Take 3 mLs by nebulization 3 (three) times daily. 270 mL 0   lovastatin (MEVACOR) 20 MG tablet Take 20 mg by mouth at bedtime.     mirtazapine (REMERON) 30 MG tablet Take 1 tablet (30 mg total) by mouth at bedtime. 30 tablet 3   montelukast (SINGULAIR) 10 MG tablet Take 1 tablet (10 mg total) by mouth at bedtime. 30 tablet 1   Omega-3 Fatty Acids (FISH OIL) 1000 MG CPDR Take 1,000 mg by mouth at bedtime.     paliperidone (INVEGA) 9 MG 24 hr tablet Take 1 tablet (9 mg total) by mouth at bedtime. 30 tablet 3   potassium chloride SA (KLOR-CON M) 20 MEQ tablet Take 1 tablet (20 mEq total) by mouth daily. 3 tablet 0   predniSONE (DELTASONE) 10 MG tablet Take 3 tablets (30 mg total) by mouth daily with breakfast for 1 day, THEN 2 tablets (20 mg total) daily with breakfast for 3 days, THEN 1 tablet (10 mg total) daily with breakfast for 3 days. 12 tablet 0   prochlorperazine (COMPAZINE) 10 MG tablet Take 1 tablet (10 mg total) by mouth every 6 (six) hours as needed for nausea or vomiting. 90 tablet 0   traZODone (DESYREL) 150 MG tablet Take 1 tablet (150 mg total) by mouth at bedtime as needed for sleep. 30 tablet 3   No current facility-administered medications for this visit.    Review of Systems  Constitutional:  Negative for appetite change, chills, fatigue, fever and unexpected weight change.  HENT:   Negative for hearing loss and voice change.   Eyes:  Negative for eye problems and icterus.  Respiratory:  Positive for cough and shortness of breath. Negative for chest tightness and hemoptysis.   Cardiovascular:  Negative for chest pain and leg swelling.  Gastrointestinal:  Negative for abdominal distention and abdominal pain.  Endocrine: Negative for hot flashes.  Genitourinary:  Negative for difficulty  urinating, dysuria and frequency.   Musculoskeletal:  Negative for arthralgias.  Skin:  Negative for itching and rash.  Neurological:  Negative for light-headedness and numbness.  Hematological:  Negative for adenopathy. Does not bruise/bleed easily.  Psychiatric/Behavioral:  Negative for confusion.      PHYSICAL EXAMINATION: ECOG PERFORMANCE STATUS: 0 - Asymptomatic  Vitals:   06/08/23 0907 06/08/23 0910  BP: (!) 160/69 (!) 127/90  Pulse: (!) 107   Resp: 20   Temp: (!) 97.2 F (36.2 C)   SpO2: 97%    Filed Weights   06/08/23 0907  Weight: 229 lb 9.6 oz (104.1 kg)    Physical Exam Constitutional:      General: He is not in acute distress.    Appearance: He is not diaphoretic.  HENT:     Head: Normocephalic and atraumatic.  Eyes:     General: No scleral icterus. Cardiovascular:     Rate and Rhythm: Normal rate and regular rhythm.  Pulmonary:     Effort: Pulmonary effort is normal. No respiratory distress.     Comments: Decreased breath sound bilaterally. Abdominal:     General: There is no distension.     Palpations: Abdomen is soft.     Tenderness: There is no abdominal tenderness.  Musculoskeletal:        General: Normal range of motion.     Cervical back: Normal range of motion and neck supple.  Skin:    General: Skin is warm and dry.     Findings: No erythema.  Neurological:     Mental Status: He is alert and oriented to person, place, and time. Mental status is at baseline.     Motor: No abnormal muscle tone.  Psychiatric:        Mood and Affect: Affect normal.     Comments: Flat      LABORATORY DATA:  I have reviewed the data as listed    Latest Ref Rng & Units 06/08/2023    8:50 AM 06/02/2023    4:41 AM 05/31/2023    4:57 AM  CBC  WBC 4.0 - 10.5 K/uL 10.9  6.2  6.0   Hemoglobin 13.0 - 17.0 g/dL 16.1  09.6  9.8   Hematocrit 39.0 - 52.0 % 35.1  30.0  30.0   Platelets 150 - 400 K/uL 161  159  176       Latest Ref Rng & Units 06/08/2023     8:50 AM 06/02/2023    4:41 AM 05/31/2023    4:57 AM  CMP  Glucose 70 - 99 mg/dL 045  90  409   BUN 8 - 23 mg/dL 12  14  12    Creatinine 0.61 - 1.24 mg/dL 8.11  9.14  7.82   Sodium 135 - 145 mmol/L 140  137  137   Potassium 3.5 - 5.1 mmol/L 3.3  3.6  4.5   Chloride 98 - 111 mmol/L 105  102  104   CO2 22 - 32 mmol/L 25  29  26    Calcium 8.9 - 10.3 mg/dL 9.2  8.8  9.2   Total Protein 6.5 - 8.1 g/dL 6.3     Total Bilirubin 0.3 - 1.2 mg/dL 0.6     Alkaline Phos 38 - 126 U/L 52     AST 15 - 41 U/L 23     ALT 0 - 44  U/L 28        RADIOGRAPHIC STUDIES: I have personally reviewed the radiological images as listed and agreed with the findings in the report. DG Chest Port 1 View  Result Date: 05/28/2023 CLINICAL DATA:  Cough. EXAM: PORTABLE CHEST 1 VIEW COMPARISON:  Radiograph and CT 05/25/2023 FINDINGS: Again seen emphysema. Streaky opacity both lung bases, right greater than left. Scarring in the periphery of the right mid lung. Stable heart size and mediastinal contours allowing for lower lung bases. No pleural effusion or pneumothorax. IMPRESSION: 1. Streaky opacity both lung bases, right greater than left, favor atelectasis. The possibility of pneumonia is not entirely excluded. 2. Emphysema. Electronically Signed   By: Narda Rutherford M.D.   On: 05/28/2023 14:04   CT Angio Chest PE W and/or Wo Contrast  Result Date: 05/25/2023 CLINICAL DATA:  Shortness of breath, productive cough for 3 days; * Tracking Code: BO * EXAM: CT ANGIOGRAPHY CHEST WITH CONTRAST TECHNIQUE: Multidetector CT imaging of the chest was performed using the standard protocol during bolus administration of intravenous contrast. Multiplanar CT image reconstructions and MIPs were obtained to evaluate the vascular anatomy. RADIATION DOSE REDUCTION: This exam was performed according to the departmental dose-optimization program which includes automated exposure control, adjustment of the mA and/or kV according to patient size  and/or use of iterative reconstruction technique. CONTRAST:  OMNIPAQUE IOHEXOL 350 MG/ML SOLN COMPARISON:  Multiple priors, most recent chest CT dated March 10, 2023 FINDINGS: Cardiovascular: No evidence of pulmonary embolus. Limited evaluation of the segmental and subsegmental pulmonary arteries due to motion artifact. Normal heart size. No pericardial effusion. Normal caliber thoracic aorta with moderate atherosclerotic disease. Severe coronary artery calcifications. Dilated main pulmonary artery, measuring up to 3.7 cm. Mediastinum/Nodes: Esophagus and thyroid are unremarkable. No enlarged lymph nodes seen in the chest. Lungs/Pleura: Central airways are patent. Emphysema. Spiculated solid nodule of the left upper lobe measuring 14 x 13 mm on series 5, image 47, previously 16 x 14 mm. Stable posttreatment change of the right upper lobe. New right lower lobe consolidation. New solid pulmonary nodule of the right upper lobe measuring 4 mm on series 5, image 66. Upper Abdomen: Partially visualized low-attenuation renal lesions, likely simple cysts. Stable bilateral adrenal adenomas. Gallstones. Musculoskeletal: No chest wall abnormality. No acute or significant osseous findings. Review of the MIP images confirms the above findings. IMPRESSION: 1. No evidence of pulmonary embolus. Limited evaluation of the segmental and subsegmental pulmonary arteries due to motion artifact. 2. New mild right lower lobe consolidation, likely due to infection or aspiration. 3. Spiculated nodule of the right upper lobe is slightly decreased in size. 4. New small solid pulmonary nodule of the right upper lobe measuring 4 mm. Recommend attention on follow-up per clinical protocol. 5. Stable posttreatment change of the right upper lobe. Electronically Signed   By: Allegra Lai M.D.   On: 05/25/2023 12:25   DG Chest Port 1 View  Result Date: 05/25/2023 CLINICAL DATA:  Questionable sepsis - evaluate for abnormality. Shortness  of breath. History of lung cancer. COPD. Productive cough for 3 days. EXAM: PORTABLE CHEST 1 VIEW COMPARISON:  03/15/2023. FINDINGS: Redemonstration of linear areas of fibrosis/scarring overlying the right mid lung zone, laterally. There is a new linear area of probable atelectasis overlying the left lower lung zone. Bilateral lung fields are otherwise clear. No acute consolidation or lung collapse. Bilateral costophrenic angles are clear. Normal cardio-mediastinal silhouette. No acute osseous abnormalities. The soft tissues are within normal limits. IMPRESSION: *No acute  cardiopulmonary disease. Electronically Signed   By: Jules Schick M.D.   On: 05/25/2023 08:48

## 2023-06-08 NOTE — Assessment & Plan Note (Signed)
Patient lives in group home.  Continue follow-up with primary care provider. On Seroquel and mirtazapine

## 2023-06-08 NOTE — Patient Instructions (Signed)
[facility]  Discharge Instructions: Thank you for choosing [facility] to provide your oncology and hematology care.  If you have a lab appointment with the Cancer Center, please go directly to the Cancer Center and check in at the registration area.  Wear comfortable clothing and clothing appropriate for easy access to any Portacath or PICC line.   We strive to give you quality time with your provider. You may need to reschedule your appointment if you arrive late (15 or more minutes).  Arriving late affects you and other patients whose appointments are after yours.  Also, if you miss three or more appointments without notifying the office, you may be dismissed from the clinic at the provider's discretion.      For prescription refill requests, have your pharmacy contact our office and allow 72 hours for refills to be completed.    Today you received the following chemotherapy and/or immunotherapy agents TAXOL and CARBOPLATIN      To help prevent nausea and vomiting after your treatment, we encourage you to take your nausea medication as directed.  BELOW ARE SYMPTOMS THAT SHOULD BE REPORTED IMMEDIATELY: *FEVER GREATER THAN 100.4 F (38 C) OR HIGHER *CHILLS OR SWEATING *NAUSEA AND VOMITING THAT IS NOT CONTROLLED WITH YOUR NAUSEA MEDICATION *UNUSUAL SHORTNESS OF BREATH *UNUSUAL BRUISING OR BLEEDING *URINARY PROBLEMS (pain or burning when urinating, or frequent urination) *BOWEL PROBLEMS (unusual diarrhea, constipation, pain near the anus) TENDERNESS IN MOUTH AND THROAT WITH OR WITHOUT PRESENCE OF ULCERS (sore throat, sores in mouth, or a toothache) UNUSUAL RASH, SWELLING OR PAIN  UNUSUAL VAGINAL DISCHARGE OR ITCHING   Items with * indicate a potential emergency and should be followed up as soon as possible or go to the Emergency Department if any problems should occur.  Please show the CHEMOTHERAPY ALERT CARD or IMMUNOTHERAPY ALERT CARD at  check-in to the Emergency Department and triage nurse.  Should you have questions after your visit or need to cancel or reschedule your appointment, please contact [facility]  [phone] and follow the prompts.  Office hours are 8:00 a.m. to 4:30 p.m. Monday - Friday. Please note that voicemails left after 4:00 p.m. may not be returned until the following business day.  We are closed weekends and major holidays. You have access to a nurse at all times for urgent questions. Please call the main number to the clinic [phone] and follow the prompts.  For any non-urgent questions, you may also contact your provider using MyChart. We now offer e-Visits for anyone 54 and older to request care online for non-urgent symptoms. For details visit [email or web address].   Also download the MyChart app! Go to the app store, search "MyChart", open the app, select [facility], and log in with your MyChart username and password.   Paclitaxel Injection What is this medication? PACLITAXEL (PAK li TAX el) treats some types of cancer. It works by slowing down the growth of cancer cells. This medicine may be used for other purposes; ask your health care provider or pharmacist if you have questions. COMMON BRAND NAME(S): Onxol, Taxol What should I tell my care team before I take this medication? They need to know if you have any of these conditions: Heart disease Liver disease Low white blood cell levels An unusual or allergic reaction to paclitaxel, other medications, foods, dyes, or preservatives If you or your partner are pregnant or trying to get pregnant Breast-feeding How should I  use this medication? This medication is injected into a vein. It is given by your care team in a hospital or clinic setting. Talk to your care team about the use of this medication in children. While it may be given to children for selected conditions, precautions do apply. Overdosage: If  you think you have taken too much of this medicine contact a poison control center or emergency room at once. NOTE: This medicine is only for you. Do not share this medicine with others. What if I miss a dose? Keep appointments for follow-up doses. It is important not to miss your dose. Call your care team if you are unable to keep an appointment. What may interact with this medication? Do not take this medication with any of the following: Live virus vaccines Other medications may affect the way this medication works. Talk with your care team about all of the medications you take. They may suggest changes to your treatment plan to lower the risk of side effects and to make sure your medications work as intended. This list may not describe all possible interactions. Give your health care provider a list of all the medicines, herbs, non-prescription drugs, or dietary supplements you use. Also tell them if you smoke, drink alcohol, or use illegal drugs. Some items may interact with your medicine. What should I watch for while using this medication? Your condition will be monitored carefully while you are receiving this medication. You may need blood work while taking this medication. This medication may make you feel generally unwell. This is not uncommon as chemotherapy can affect healthy cells as well as cancer cells. Report any side effects. Continue your course of treatment even though you feel ill unless your care team tells you to stop. This medication can cause serious allergic reactions. To reduce the risk, your care team may give you other medications to take before receiving this one. Be sure to follow the directions from your care team. This medication may increase your risk of getting an infection. Call your care team for advice if you get a fever, chills, sore throat, or other symptoms of a cold or flu. Do not treat yourself. Try to avoid being around people who are sick. This medication may  increase your risk to bruise or bleed. Call your care team if you notice any unusual bleeding. Be careful brushing or flossing your teeth or using a toothpick because you may get an infection or bleed more easily. If you have any dental work done, tell your dentist you are receiving this medication. Talk to your care team if you may be pregnant. Serious birth defects can occur if you take this medication during pregnancy. Talk to your care team before breastfeeding. Changes to your treatment plan may be needed. What side effects may I notice from receiving this medication? Side effects that you should report to your care team as soon as possible: Allergic reactions--skin rash, itching, hives, swelling of the face, lips, tongue, or throat Heart rhythm changes--fast or irregular heartbeat, dizziness, feeling faint or lightheaded, chest pain, trouble breathing Increase in blood pressure Infection--fever, chills, cough, sore throat, wounds that don't heal, pain or trouble when passing urine, general feeling of discomfort or being unwell Low blood pressure--dizziness, feeling faint or lightheaded, blurry vision Low red blood cell level--unusual weakness or fatigue, dizziness, headache, trouble breathing Painful swelling, warmth, or redness of the skin, blisters or sores at the infusion site Pain, tingling, or numbness in the hands or feet Slow  heartbeat--dizziness, feeling faint or lightheaded, confusion, trouble breathing, unusual weakness or fatigue Unusual bruising or bleeding Side effects that usually do not require medical attention (report to your care team if they continue or are bothersome): Diarrhea Hair loss Joint pain Loss of appetite Muscle pain Nausea Vomiting This list may not describe all possible side effects. Call your doctor for medical advice about side effects. You may report side effects to FDA at 1-800-FDA-1088. Where should I keep my medication? This medication is given in  a hospital or clinic. It will not be stored at home. NOTE: This sheet is a summary. It may not cover all possible information. If you have questions about this medicine, talk to your doctor, pharmacist, or health care provider.  2024 Elsevier/Gold Standard (2021-12-14 00:00:00)  Carboplatin Injection What is this medication? CARBOPLATIN (KAR boe pla tin) treats some types of cancer. It works by slowing down the growth of cancer cells. This medicine may be used for other purposes; ask your health care provider or pharmacist if you have questions. COMMON BRAND NAME(S): Paraplatin What should I tell my care team before I take this medication? They need to know if you have any of these conditions: Blood disorders Hearing problems Kidney disease Recent or ongoing radiation therapy An unusual or allergic reaction to carboplatin, cisplatin, other medications, foods, dyes, or preservatives Pregnant or trying to get pregnant Breast-feeding How should I use this medication? This medication is injected into a vein. It is given by your care team in a hospital or clinic setting. Talk to your care team about the use of this medication in children. Special care may be needed. Overdosage: If you think you have taken too much of this medicine contact a poison control center or emergency room at once. NOTE: This medicine is only for you. Do not share this medicine with others. What if I miss a dose? Keep appointments for follow-up doses. It is important not to miss your dose. Call your care team if you are unable to keep an appointment. What may interact with this medication? Medications for seizures Some antibiotics, such as amikacin, gentamicin, neomycin, streptomycin, tobramycin Vaccines This list may not describe all possible interactions. Give your health care provider a list of all the medicines, herbs, non-prescription drugs, or dietary supplements you use. Also tell them if you smoke, drink  alcohol, or use illegal drugs. Some items may interact with your medicine. What should I watch for while using this medication? Your condition will be monitored carefully while you are receiving this medication. You may need blood work while taking this medication. This medication may make you feel generally unwell. This is not uncommon, as chemotherapy can affect healthy cells as well as cancer cells. Report any side effects. Continue your course of treatment even though you feel ill unless your care team tells you to stop. In some cases, you may be given additional medications to help with side effects. Follow all directions for their use. This medication may increase your risk of getting an infection. Call your care team for advice if you get a fever, chills, sore throat, or other symptoms of a cold or flu. Do not treat yourself. Try to avoid being around people who are sick. Avoid taking medications that contain aspirin, acetaminophen, ibuprofen, naproxen, or ketoprofen unless instructed by your care team. These medications may hide a fever. Be careful brushing or flossing your teeth or using a toothpick because you may get an infection or bleed more easily. If  you have any dental work done, tell your dentist you are receiving this medication. Talk to your care team if you wish to become pregnant or think you might be pregnant. This medication can cause serious birth defects. Talk to your care team about effective forms of contraception. Do not breast-feed while taking this medication. What side effects may I notice from receiving this medication? Side effects that you should report to your care team as soon as possible: Allergic reactions--skin rash, itching, hives, swelling of the face, lips, tongue, or throat Infection--fever, chills, cough, sore throat, wounds that don't heal, pain or trouble when passing urine, general feeling of discomfort or being unwell Low red blood cell level--unusual  weakness or fatigue, dizziness, headache, trouble breathing Pain, tingling, or numbness in the hands or feet, muscle weakness, change in vision, confusion or trouble speaking, loss of balance or coordination, trouble walking, seizures Unusual bruising or bleeding Side effects that usually do not require medical attention (report to your care team if they continue or are bothersome): Hair loss Nausea Unusual weakness or fatigue Vomiting This list may not describe all possible side effects. Call your doctor for medical advice about side effects. You may report side effects to FDA at 1-800-FDA-1088. Where should I keep my medication? This medication is given in a hospital or clinic. It will not be stored at home. NOTE: This sheet is a summary. It may not cover all possible information. If you have questions about this medicine, talk to your doctor, pharmacist, or health care provider.  2024 Elsevier/Gold Standard (2021-11-16 00:00:00)

## 2023-06-08 NOTE — Assessment & Plan Note (Signed)
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 Left upper lobe adenocarcinoma, No enough tissue for NGS, will send off liquid biopsy- send off liquid biopsy.  Labs are reviewed and discussed with patient. Proceed with carboplatin AUC 2 and taxol 45mg /m2 today. This is his last chemotherapy. He will finish RT next week.  Plan repeat CT chest 4 weeks after RT, if stable disease plan maintenance Durvalumab.

## 2023-06-09 ENCOUNTER — Ambulatory Visit
Admission: RE | Admit: 2023-06-09 | Discharge: 2023-06-09 | Disposition: A | Discharge status: Home / Self Care | Payer: 59 | Source: Ambulatory Visit | Attending: Radiation Oncology | Admitting: Radiation Oncology

## 2023-06-09 ENCOUNTER — Ambulatory Visit: Payer: 59

## 2023-06-09 ENCOUNTER — Other Ambulatory Visit: Payer: Self-pay

## 2023-06-09 DIAGNOSIS — K449 Diaphragmatic hernia without obstruction or gangrene: Secondary | ICD-10-CM | POA: Diagnosis not present

## 2023-06-09 DIAGNOSIS — J449 Chronic obstructive pulmonary disease, unspecified: Secondary | ICD-10-CM | POA: Insufficient documentation

## 2023-06-09 DIAGNOSIS — F209 Schizophrenia, unspecified: Secondary | ICD-10-CM | POA: Diagnosis not present

## 2023-06-09 DIAGNOSIS — D3502 Benign neoplasm of left adrenal gland: Secondary | ICD-10-CM | POA: Diagnosis not present

## 2023-06-09 DIAGNOSIS — D3501 Benign neoplasm of right adrenal gland: Secondary | ICD-10-CM | POA: Diagnosis not present

## 2023-06-09 DIAGNOSIS — K8020 Calculus of gallbladder without cholecystitis without obstruction: Secondary | ICD-10-CM | POA: Insufficient documentation

## 2023-06-09 DIAGNOSIS — I10 Essential (primary) hypertension: Secondary | ICD-10-CM | POA: Diagnosis not present

## 2023-06-09 DIAGNOSIS — Z8673 Personal history of transient ischemic attack (TIA), and cerebral infarction without residual deficits: Secondary | ICD-10-CM | POA: Insufficient documentation

## 2023-06-09 DIAGNOSIS — I2510 Atherosclerotic heart disease of native coronary artery without angina pectoris: Secondary | ICD-10-CM | POA: Diagnosis not present

## 2023-06-09 DIAGNOSIS — C3412 Malignant neoplasm of upper lobe, left bronchus or lung: Secondary | ICD-10-CM | POA: Diagnosis present

## 2023-06-09 DIAGNOSIS — K5900 Constipation, unspecified: Secondary | ICD-10-CM | POA: Diagnosis not present

## 2023-06-09 DIAGNOSIS — Z7902 Long term (current) use of antithrombotics/antiplatelets: Secondary | ICD-10-CM | POA: Diagnosis not present

## 2023-06-09 DIAGNOSIS — J45909 Unspecified asthma, uncomplicated: Secondary | ICD-10-CM | POA: Diagnosis not present

## 2023-06-09 DIAGNOSIS — Z79899 Other long term (current) drug therapy: Secondary | ICD-10-CM | POA: Diagnosis not present

## 2023-06-09 DIAGNOSIS — E876 Hypokalemia: Secondary | ICD-10-CM | POA: Diagnosis not present

## 2023-06-09 DIAGNOSIS — I700 Atherosclerosis of aorta: Secondary | ICD-10-CM | POA: Diagnosis not present

## 2023-06-09 DIAGNOSIS — Z7982 Long term (current) use of aspirin: Secondary | ICD-10-CM | POA: Insufficient documentation

## 2023-06-09 DIAGNOSIS — J9621 Acute and chronic respiratory failure with hypoxia: Secondary | ICD-10-CM | POA: Insufficient documentation

## 2023-06-09 DIAGNOSIS — R053 Chronic cough: Secondary | ICD-10-CM | POA: Diagnosis not present

## 2023-06-09 DIAGNOSIS — Z510 Encounter for antineoplastic radiation therapy: Secondary | ICD-10-CM | POA: Diagnosis not present

## 2023-06-09 DIAGNOSIS — Z87891 Personal history of nicotine dependence: Secondary | ICD-10-CM | POA: Insufficient documentation

## 2023-06-09 DIAGNOSIS — D689 Coagulation defect, unspecified: Secondary | ICD-10-CM | POA: Diagnosis not present

## 2023-06-09 LAB — RAD ONC ARIA SESSION SUMMARY
Course Elapsed Days: 46
Plan Fractions Treated to Date: 32
Plan Prescribed Dose Per Fraction: 2 Gy
Plan Total Fractions Prescribed: 35
Plan Total Prescribed Dose: 70 Gy
Reference Point Dosage Given to Date: 64 Gy
Reference Point Session Dosage Given: 2 Gy
Session Number: 32

## 2023-06-12 ENCOUNTER — Ambulatory Visit
Admission: RE | Admit: 2023-06-12 | Discharge: 2023-06-12 | Discharge status: Home / Self Care | Payer: 59 | Source: Ambulatory Visit | Attending: Radiation Oncology | Admitting: Radiation Oncology

## 2023-06-12 ENCOUNTER — Other Ambulatory Visit: Payer: Self-pay

## 2023-06-12 ENCOUNTER — Ambulatory Visit: Payer: 59

## 2023-06-12 ENCOUNTER — Encounter: Payer: Self-pay | Admitting: Oncology

## 2023-06-12 DIAGNOSIS — C3412 Malignant neoplasm of upper lobe, left bronchus or lung: Secondary | ICD-10-CM | POA: Diagnosis not present

## 2023-06-12 LAB — RAD ONC ARIA SESSION SUMMARY
Course Elapsed Days: 49
Plan Fractions Treated to Date: 33
Plan Prescribed Dose Per Fraction: 2 Gy
Plan Total Fractions Prescribed: 35
Plan Total Prescribed Dose: 70 Gy
Reference Point Dosage Given to Date: 66 Gy
Reference Point Session Dosage Given: 2 Gy
Session Number: 33

## 2023-06-13 ENCOUNTER — Other Ambulatory Visit: Payer: Self-pay

## 2023-06-13 ENCOUNTER — Inpatient Hospital Stay
Admission: EM | Admit: 2023-06-13 | Discharge: 2023-06-16 | DRG: 191 | Disposition: A | Discharge status: Home / Self Care | Payer: 59 | Attending: Internal Medicine | Admitting: Internal Medicine

## 2023-06-13 ENCOUNTER — Encounter: Payer: Self-pay | Admitting: Internal Medicine

## 2023-06-13 ENCOUNTER — Emergency Department: Payer: 59

## 2023-06-13 ENCOUNTER — Ambulatory Visit
Admission: RE | Admit: 2023-06-13 | Discharge: 2023-06-13 | Disposition: A | Discharge status: Home / Self Care | Payer: 59 | Source: Ambulatory Visit | Attending: Radiation Oncology | Admitting: Radiation Oncology

## 2023-06-13 ENCOUNTER — Ambulatory Visit: Payer: 59

## 2023-06-13 DIAGNOSIS — F32A Depression, unspecified: Secondary | ICD-10-CM | POA: Diagnosis present

## 2023-06-13 DIAGNOSIS — F203 Undifferentiated schizophrenia: Secondary | ICD-10-CM | POA: Diagnosis present

## 2023-06-13 DIAGNOSIS — Z79899 Other long term (current) drug therapy: Secondary | ICD-10-CM

## 2023-06-13 DIAGNOSIS — Z1152 Encounter for screening for COVID-19: Secondary | ICD-10-CM

## 2023-06-13 DIAGNOSIS — Z8659 Personal history of other mental and behavioral disorders: Secondary | ICD-10-CM

## 2023-06-13 DIAGNOSIS — Z8673 Personal history of transient ischemic attack (TIA), and cerebral infarction without residual deficits: Secondary | ICD-10-CM

## 2023-06-13 DIAGNOSIS — Z825 Family history of asthma and other chronic lower respiratory diseases: Secondary | ICD-10-CM

## 2023-06-13 DIAGNOSIS — J441 Chronic obstructive pulmonary disease with (acute) exacerbation: Secondary | ICD-10-CM | POA: Diagnosis not present

## 2023-06-13 DIAGNOSIS — J449 Chronic obstructive pulmonary disease, unspecified: Secondary | ICD-10-CM | POA: Diagnosis present

## 2023-06-13 DIAGNOSIS — Z9151 Personal history of suicidal behavior: Secondary | ICD-10-CM

## 2023-06-13 DIAGNOSIS — G4709 Other insomnia: Secondary | ICD-10-CM | POA: Diagnosis present

## 2023-06-13 DIAGNOSIS — R45850 Homicidal ideations: Secondary | ICD-10-CM | POA: Diagnosis present

## 2023-06-13 DIAGNOSIS — Z8249 Family history of ischemic heart disease and other diseases of the circulatory system: Secondary | ICD-10-CM

## 2023-06-13 DIAGNOSIS — R45851 Suicidal ideations: Secondary | ICD-10-CM | POA: Diagnosis present

## 2023-06-13 DIAGNOSIS — Z7982 Long term (current) use of aspirin: Secondary | ICD-10-CM

## 2023-06-13 DIAGNOSIS — F17210 Nicotine dependence, cigarettes, uncomplicated: Secondary | ICD-10-CM | POA: Diagnosis present

## 2023-06-13 DIAGNOSIS — F201 Disorganized schizophrenia: Secondary | ICD-10-CM

## 2023-06-13 DIAGNOSIS — Z923 Personal history of irradiation: Secondary | ICD-10-CM

## 2023-06-13 DIAGNOSIS — R000 Tachycardia, unspecified: Secondary | ICD-10-CM | POA: Diagnosis present

## 2023-06-13 DIAGNOSIS — E785 Hyperlipidemia, unspecified: Secondary | ICD-10-CM | POA: Diagnosis present

## 2023-06-13 DIAGNOSIS — I10 Essential (primary) hypertension: Secondary | ICD-10-CM | POA: Diagnosis present

## 2023-06-13 DIAGNOSIS — Z7902 Long term (current) use of antithrombotics/antiplatelets: Secondary | ICD-10-CM

## 2023-06-13 DIAGNOSIS — F209 Schizophrenia, unspecified: Secondary | ICD-10-CM | POA: Diagnosis present

## 2023-06-13 DIAGNOSIS — R911 Solitary pulmonary nodule: Secondary | ICD-10-CM | POA: Diagnosis present

## 2023-06-13 DIAGNOSIS — Z85118 Personal history of other malignant neoplasm of bronchus and lung: Secondary | ICD-10-CM

## 2023-06-13 LAB — CBC
HCT: 35.1 % — ABNORMAL LOW (ref 39.0–52.0)
Hemoglobin: 11.6 g/dL — ABNORMAL LOW (ref 13.0–17.0)
MCH: 31.4 pg (ref 26.0–34.0)
MCHC: 33 g/dL (ref 30.0–36.0)
MCV: 94.9 fL (ref 80.0–100.0)
Platelets: 157 10*3/uL (ref 150–400)
RBC: 3.7 MIL/uL — ABNORMAL LOW (ref 4.22–5.81)
RDW: 17.2 % — ABNORMAL HIGH (ref 11.5–15.5)
WBC: 6.2 10*3/uL (ref 4.0–10.5)
nRBC: 0 % (ref 0.0–0.2)

## 2023-06-13 LAB — ACETAMINOPHEN LEVEL: Acetaminophen (Tylenol), Serum: <10 ug/mL — ABNORMAL LOW (ref 10–30)

## 2023-06-13 LAB — COMPREHENSIVE METABOLIC PANEL
ALT: 26 U/L (ref 0–44)
AST: 19 U/L (ref 15–41)
Albumin: 3.7 g/dL (ref 3.5–5.0)
Alkaline Phosphatase: 60 U/L (ref 38–126)
Anion gap: 9 (ref 5–15)
BUN: 12 mg/dL (ref 8–23)
CO2: 24 mmol/L (ref 22–32)
Calcium: 9.1 mg/dL (ref 8.9–10.3)
Chloride: 104 mmol/L (ref 98–111)
Creatinine, Ser: 0.67 mg/dL (ref 0.61–1.24)
GFR, Estimated: >60 mL/min (ref >60)
Glucose, Bld: 102 mg/dL — ABNORMAL HIGH (ref 70–99)
Potassium: 4 mmol/L (ref 3.5–5.1)
Sodium: 137 mmol/L (ref 135–145)
Total Bilirubin: 0.9 mg/dL (ref <1.2)
Total Protein: 6.9 g/dL (ref 6.5–8.1)

## 2023-06-13 LAB — RAD ONC ARIA SESSION SUMMARY
Course Elapsed Days: 50
Plan Fractions Treated to Date: 34
Plan Prescribed Dose Per Fraction: 2 Gy
Plan Total Fractions Prescribed: 35
Plan Total Prescribed Dose: 70 Gy
Reference Point Dosage Given to Date: 68 Gy
Reference Point Session Dosage Given: 2 Gy
Session Number: 34

## 2023-06-13 LAB — URINE DRUG SCREEN, QUALITATIVE (ARMC ONLY)
Amphetamines, Ur Screen: NOT DETECTED
Barbiturates, Ur Screen: NOT DETECTED
Benzodiazepine, Ur Scrn: NOT DETECTED
Cannabinoid 50 Ng, Ur ~~LOC~~: NOT DETECTED
Cocaine Metabolite,Ur ~~LOC~~: NOT DETECTED
MDMA (Ecstasy)Ur Screen: NOT DETECTED
Methadone Scn, Ur: NOT DETECTED
Opiate, Ur Screen: NOT DETECTED
Phencyclidine (PCP) Ur S: NOT DETECTED
Tricyclic, Ur Screen: NOT DETECTED

## 2023-06-13 LAB — BLOOD GAS, VENOUS
Acid-Base Excess: 1.2 mmol/L (ref 0.0–2.0)
Bicarbonate: 26 mmol/L (ref 20.0–28.0)
O2 Saturation: 81.8 %
Patient temperature: 37
pCO2, Ven: 41 mm[Hg] — ABNORMAL LOW (ref 44–60)
pH, Ven: 7.41 (ref 7.25–7.43)
pO2, Ven: 48 mm[Hg] — ABNORMAL HIGH (ref 32–45)

## 2023-06-13 LAB — LACTIC ACID, PLASMA
Lactic Acid, Venous: 2.3 mmol/L (ref 0.5–1.9)
Lactic Acid, Venous: 1.5 mmol/L (ref 0.5–1.9)

## 2023-06-13 LAB — RESP PANEL BY RT-PCR (RSV, FLU A&B, COVID)  RVPGX2
Influenza A by PCR: NEGATIVE
Influenza B by PCR: NEGATIVE
Resp Syncytial Virus by PCR: NEGATIVE
SARS Coronavirus 2 by RT PCR: NEGATIVE

## 2023-06-13 LAB — ETHANOL: Alcohol, Ethyl (B): <10 mg/dL (ref <10)

## 2023-06-13 LAB — SALICYLATE LEVEL: Salicylate Lvl: <7 mg/dL — ABNORMAL LOW (ref 7.0–30.0)

## 2023-06-13 MED ORDER — MONTELUKAST SODIUM 10 MG PO TABS
10.0000 mg | ORAL_TABLET | Freq: Every day | ORAL | Status: DC
  Administered 2023-06-13 – 2023-06-15 (×3): 10 mg via ORAL
  Filled 2023-06-13 (×4): qty 1

## 2023-06-13 MED ORDER — GUAIFENESIN-DM 100-10 MG/5ML PO SYRP
10.0000 mL | ORAL_SOLUTION | Freq: Four times a day (QID) | ORAL | Status: DC | PRN
  Administered 2023-06-14: 10 mL via ORAL
  Filled 2023-06-13: qty 10

## 2023-06-13 MED ORDER — PRAVASTATIN SODIUM 20 MG PO TABS
20.0000 mg | ORAL_TABLET | Freq: Every day | ORAL | Status: DC
  Administered 2023-06-13 – 2023-06-15 (×3): 20 mg via ORAL
  Filled 2023-06-13 (×3): qty 1

## 2023-06-13 MED ORDER — UMECLIDINIUM BROMIDE 62.5 MCG/ACT IN AEPB
1.0000 | INHALATION_SPRAY | Freq: Every day | RESPIRATORY_TRACT | Status: DC
  Administered 2023-06-14 – 2023-06-16 (×3): 1 via RESPIRATORY_TRACT
  Filled 2023-06-13: qty 7

## 2023-06-13 MED ORDER — TRAZODONE HCL 50 MG PO TABS
150.0000 mg | ORAL_TABLET | Freq: Every evening | ORAL | Status: DC | PRN
  Administered 2023-06-13: 150 mg via ORAL
  Filled 2023-06-13 (×2): qty 1

## 2023-06-13 MED ORDER — DOCUSATE SODIUM 100 MG PO CAPS: 100.0000 mg | ORAL_CAPSULE | Freq: Two times a day (BID) | ORAL | Status: DC | PRN

## 2023-06-13 MED ORDER — MIRTAZAPINE 15 MG PO TABS
30.0000 mg | ORAL_TABLET | Freq: Every day | ORAL | Status: DC
Start: 2023-06-13 — End: 2023-06-16
  Administered 2023-06-13 – 2023-06-15 (×3): 30 mg via ORAL
  Filled 2023-06-13 (×5): qty 2

## 2023-06-13 MED ORDER — IPRATROPIUM-ALBUTEROL 0.5-2.5 (3) MG/3ML IN SOLN
RESPIRATORY_TRACT | Status: AC
  Filled 2023-06-13: qty 6

## 2023-06-13 MED ORDER — CLOPIDOGREL BISULFATE 75 MG PO TABS
75.0000 mg | ORAL_TABLET | Freq: Every day | ORAL | Status: DC
Start: 2023-06-14 — End: 2023-06-16
  Administered 2023-06-14 – 2023-06-16 (×3): 75 mg via ORAL
  Filled 2023-06-13 (×3): qty 1

## 2023-06-13 MED ORDER — SODIUM CHLORIDE 0.9 % IV BOLUS
2000.0000 mL | Freq: Once | INTRAVENOUS | Status: AC
  Administered 2023-06-13: 2000 mL via INTRAVENOUS

## 2023-06-13 MED ORDER — DOXYCYCLINE HYCLATE 100 MG PO TABS
100.0000 mg | ORAL_TABLET | Freq: Two times a day (BID) | ORAL | Status: DC
  Administered 2023-06-13 – 2023-06-16 (×6): 100 mg via ORAL
  Filled 2023-06-13 (×6): qty 1

## 2023-06-13 MED ORDER — ALBUTEROL SULFATE (2.5 MG/3ML) 0.083% IN NEBU
3.0000 mL | INHALATION_SOLUTION | RESPIRATORY_TRACT | Status: DC | PRN
  Administered 2023-06-14 – 2023-06-15 (×2): 3 mL via RESPIRATORY_TRACT
  Filled 2023-06-13 (×2): qty 3

## 2023-06-13 MED ORDER — METHYLPREDNISOLONE SODIUM SUCC 125 MG IJ SOLR
125.0000 mg | Freq: Once | INTRAMUSCULAR | Status: AC
  Administered 2023-06-13: 125 mg via INTRAVENOUS
  Filled 2023-06-13: qty 2

## 2023-06-13 MED ORDER — IPRATROPIUM-ALBUTEROL 0.5-2.5 (3) MG/3ML IN SOLN
9.0000 mL | Freq: Once | RESPIRATORY_TRACT | Status: AC
  Administered 2023-06-13: 9 mL via RESPIRATORY_TRACT
  Filled 2023-06-13: qty 3

## 2023-06-13 MED ORDER — PREDNISONE 20 MG PO TABS
40.0000 mg | ORAL_TABLET | Freq: Every day | ORAL | Status: DC
  Administered 2023-06-14 – 2023-06-16 (×3): 40 mg via ORAL
  Filled 2023-06-13 (×3): qty 2

## 2023-06-13 MED ORDER — ASPIRIN 81 MG PO TBEC
81.0000 mg | DELAYED_RELEASE_TABLET | Freq: Every day | ORAL | Status: DC
  Administered 2023-06-14 – 2023-06-16 (×3): 81 mg via ORAL
  Filled 2023-06-13 (×3): qty 1

## 2023-06-13 MED ORDER — ENOXAPARIN SODIUM 60 MG/0.6ML IJ SOSY
50.0000 mg | PREFILLED_SYRINGE | INTRAMUSCULAR | Status: DC
  Administered 2023-06-14 – 2023-06-15 (×2): 50 mg via SUBCUTANEOUS
  Filled 2023-06-13 (×3): qty 0.6

## 2023-06-13 MED ORDER — PALIPERIDONE ER 3 MG PO TB24
9.0000 mg | ORAL_TABLET | Freq: Every day | ORAL | Status: DC
Start: 2023-06-13 — End: 2023-06-14
  Administered 2023-06-13: 9 mg via ORAL
  Filled 2023-06-13 (×2): qty 3

## 2023-06-13 MED ORDER — DEXTROSE 5 % IV SOLN
500.0000 mg | Freq: Once | INTRAVENOUS | Status: AC
  Administered 2023-06-13: 500 mg via INTRAVENOUS
  Filled 2023-06-13: qty 5

## 2023-06-13 MED ORDER — SODIUM CHLORIDE 0.9 % IV SOLN
1.0000 g | Freq: Once | INTRAVENOUS | Status: AC
  Administered 2023-06-13: 1 g via INTRAVENOUS
  Filled 2023-06-13: qty 10

## 2023-06-13 MED ORDER — DILTIAZEM HCL ER COATED BEADS 120 MG PO CP24
240.0000 mg | ORAL_CAPSULE | Freq: Every day | ORAL | Status: DC
Start: 2023-06-14 — End: 2023-06-16
  Administered 2023-06-14 – 2023-06-16 (×3): 240 mg via ORAL
  Filled 2023-06-13 (×3): qty 2

## 2023-06-13 MED ORDER — PROCHLORPERAZINE MALEATE 10 MG PO TABS: 10.0000 mg | ORAL_TABLET | Freq: Four times a day (QID) | ORAL | Status: DC | PRN

## 2023-06-13 MED ORDER — IPRATROPIUM-ALBUTEROL 0.5-2.5 (3) MG/3ML IN SOLN
3.0000 mL | Freq: Three times a day (TID) | RESPIRATORY_TRACT | Status: DC
Start: 2023-06-13 — End: 2023-06-15
  Administered 2023-06-13 – 2023-06-15 (×5): 3 mL via RESPIRATORY_TRACT
  Filled 2023-06-13 (×6): qty 3

## 2023-06-13 MED ORDER — FLUTICASONE FUROATE-VILANTEROL 100-25 MCG/ACT IN AEPB
1.0000 | INHALATION_SPRAY | Freq: Every day | RESPIRATORY_TRACT | Status: DC
  Administered 2023-06-14 – 2023-06-16 (×3): 1 via RESPIRATORY_TRACT
  Filled 2023-06-13: qty 28

## 2023-06-13 MED ORDER — CLONAZEPAM 0.5 MG PO TABS: 1.0000 mg | ORAL_TABLET | Freq: Every day | ORAL | Status: DC

## 2023-06-13 MED ORDER — FAMOTIDINE 20 MG PO TABS
20.0000 mg | ORAL_TABLET | Freq: Every day | ORAL | Status: DC
  Administered 2023-06-14 – 2023-06-16 (×3): 20 mg via ORAL
  Filled 2023-06-13 (×3): qty 1

## 2023-06-13 NOTE — ED Provider Notes (Signed)
[facility] Provider Note    Event Date/Time   First MD Initiated Contact with Patient 06/13/23 1250     (approximate)   History   Hearing Voices   HPI  [name] is a 62 y.o. male   Past medical history of schizophrenia, COPD prior stroke, lung CA, who presents to the emergency department with 2 complaints.  He has been hearing voices telling him to kill himself and has a plan to kill himself by slitting his own neck.  He has had poor sleep.  He states compliance with his psychiatric medications.  Over the last several days he has a productive cough, shortness of breath, wheezing and thinks that he needs a breathing treatment.  He presents in respiratory distress, increased work of breathing, wheezing.  He does endorse audio hallucinations telling him to kill himself, command hallucinations, he does feel suicidal, he does endorse a plan to cut himself with a knife, but he did not denies any self-harm today or ingestions.  No drug or alcohol use.  He is here voluntarily.  External Medical Documents Reviewed: Discharge summary dated 06/05/2023 for severe sepsis COPD exacerbation found to have evidence of pneumonia      Physical Exam   Triage Vital Signs: ED Triage Vitals  Encounter Vitals Group     BP 06/13/23 1239 (!) 112/95     Systolic BP Percentile --      Diastolic BP Percentile --      Pulse Rate 06/13/23 1239 (!) 116     Resp 06/13/23 1239 18     Temp 06/13/23 1239 97.9 F (36.6 C)     Temp Source 06/13/23 1239 Oral     SpO2 06/13/23 1239 99 %     Weight 06/13/23 1240 232 lb (105.2 kg)     Height 06/13/23 1240 6\' 1"  (1.854 m)     Head Circumference --      Peak Flow --      Pain Score 06/13/23 1240 0     Pain Loc --      Pain Education --      Exclude from Growth Chart --     Most recent vital signs: Vitals:   06/13/23 1239 06/13/23 1330  BP: (!) 112/95 (!) 117/59  Pulse: (!) 116 (!) 106  Resp: 18 16  Temp: 97.9 F (36.6  C)   SpO2: 99% 96%    General: Awake, cooperative CV:  Good peripheral perfusion.  Resp:  Increased work of breathing, tachypnea, wheezing Abd:  No distention.  Other:  Tachycardic and afebrile.   ED Results / Procedures / Treatments   Labs (all labs ordered are listed, but only abnormal results are displayed) Labs Reviewed  COMPREHENSIVE METABOLIC PANEL - Abnormal; Notable for the following components:      Result Value   Glucose, Bld 102 (*)    All other components within normal limits  SALICYLATE LEVEL - Abnormal; Notable for the following components:   Salicylate Lvl <7.0 (*)    All other components within normal limits  ACETAMINOPHEN LEVEL - Abnormal; Notable for the following components:   Acetaminophen (Tylenol), Serum <10 (*)    All other components within normal limits  CBC - Abnormal; Notable for the following components:   RBC 3.70 (*)    Hemoglobin 11.6 (*)    HCT 35.1 (*)    RDW 17.2 (*)    All other components within normal limits  LACTIC ACID, PLASMA - Abnormal;  Notable for the following components:   Lactic Acid, Venous 2.3 (*)    All other components within normal limits  BLOOD GAS, VENOUS - Abnormal; Notable for the following components:   pCO2, Ven 41 (*)    pO2, Ven 48 (*)    All other components within normal limits  RESP PANEL BY RT-PCR (RSV, FLU A&B, COVID)  [id number]  CULTURE, BLOOD (ROUTINE X 2)  CULTURE, BLOOD (ROUTINE X 2)  ETHANOL  URINE DRUG SCREEN, QUALITATIVE ([facility] ONLY)  LACTIC ACID, PLASMA  HIV ANTIBODY (ROUTINE TESTING W REFLEX)     I ordered and reviewed the above labs they are notable for white blood cell count is normal H&H is at baseline and electrolytes are unremarkable  EKG  ED ECG REPORT I, Pilar Jarvis, the attending physician, personally viewed and interpreted this ECG.   Date: 06/13/2023  EKG Time: 1335  Rate: 101  Rhythm: sinus tachycardia  Axis: nl  Intervals:none  ST&T Change:  nostemi    RADIOLOGY I  independently reviewed and interpreted chest x-ray and I see no obvious focal consolidations or pneumothorax I also reviewed radiologist's formal read.   PROCEDURES:  Critical Care performed: No  Procedures   MEDICATIONS ORDERED IN ED: Medications  ipratropium-albuterol (DUONEB) 0.5-2.5 (3) MG/3ML nebulizer solution (has no administration in time range)  aspirin EC tablet 81 mg (has no administration in time range)  diltiazem (CARDIZEM CD) 24 hr capsule 240 mg (has no administration in time range)  pravastatin (PRAVACHOL) tablet 20 mg (has no administration in time range)  mirtazapine (REMERON) tablet 30 mg (has no administration in time range)  paliperidone (INVEGA) 24 hr tablet 9 mg (has no administration in time range)  prochlorperazine (COMPAZINE) tablet 10 mg (has no administration in time range)  traZODone (DESYREL) tablet 150 mg (has no administration in time range)  docusate sodium (COLACE) capsule 100 mg (has no administration in time range)  famotidine (PEPCID) tablet 20 mg (has no administration in time range)  clopidogrel (PLAVIX) tablet 75 mg (has no administration in time range)  albuterol (PROVENTIL) (2.5 MG/3ML) 0.083% nebulizer solution 3 mL (has no administration in time range)  guaiFENesin-dextromethorphan (ROBITUSSIN DM) 100-10 MG/5ML syrup 10 mL (has no administration in time range)  fluticasone furoate-vilanterol (BREO ELLIPTA) 100-25 MCG/ACT 1 puff (has no administration in time range)    And  umeclidinium bromide (INCRUSE ELLIPTA) 62.5 MCG/ACT 1 puff (has no administration in time range)  ipratropium-albuterol (DUONEB) 0.5-2.5 (3) MG/3ML nebulizer solution 3 mL (has no administration in time range)  montelukast (SINGULAIR) tablet 10 mg (has no administration in time range)  doxycycline (VIBRA-TABS) tablet 100 mg (has no administration in time range)  predniSONE (DELTASONE) tablet 40 mg (has no administration in time range)  enoxaparin (LOVENOX) injection 50 mg  (has no administration in time range)  ipratropium-albuterol (DUONEB) 0.5-2.5 (3) MG/3ML nebulizer solution 9 mL (9 mLs Nebulization Given 06/13/23 1325)  methylPREDNISolone sodium succinate (SOLU-MEDROL) 125 mg/2 mL injection 125 mg (125 mg Intravenous Given 06/13/23 1333)  sodium chloride 0.9 % bolus 2,000 mL (2,000 mLs Intravenous New Bag/Given 06/13/23 1331)  azithromycin (ZITHROMAX) 500 mg in dextrose 5 % 250 mL IVPB (500 mg Intravenous New Bag/Given 06/13/23 1358)  cefTRIAXone (ROCEPHIN) 1 g in sodium chloride 0.9 % 100 mL IVPB (0 g Intravenous Stopped 06/13/23 1426)    External physician / consultants:  I spoke with hospitalist for admission and regarding care plan for this patient.   IMPRESSION / MDM / ASSESSMENT AND PLAN /  ED COURSE  I reviewed the triage vital signs and the nursing notes.                                Patient's presentation is most consistent with acute presentation with potential threat to life or bodily function.  Differential diagnosis includes, but is not limited to, COPD exacerbation, bacterial pneumonia, sepsis, viral illness, suicidal ideation/acute decompensated psychiatric illness   The patient is on the cardiac monitor to evaluate for evidence of arrhythmia and/or significant heart rate changes.  MDM:    Looks like he is having a COPD exacerbation concern for respiratory infection or sepsis given his vital sign abnormalities including tachycardia.  Check sepsis labs, check chest x-ray, viral swabs, treat COPD exacerbation with DuoNebs and Solu-Medrol. Give fluids 30 cckg ideal body weight. Empiric pna coverage.   In terms of his schizophrenia with hallucinations and suicidal ideation with a plan, he will be placed under IVC and psychiatric consultation after his medical problems have been addressed.         FINAL CLINICAL IMPRESSION(S) / ED DIAGNOSES   Final diagnoses:  Acute exacerbation of chronic obstructive pulmonary disease (COPD) (HCC)   History of command hallucinations  Suicidal ideations     Rx / DC Orders   ED Discharge Orders     None        Note:  This document was prepared using Dragon voice recognition software and may include unintentional dictation errors.    Pilar Jarvis, MD 06/13/23 [phone]

## 2023-06-13 NOTE — ED Notes (Signed)
PT  PLACED UNDER  IVC PAPERS  PER  DR  Nash Dimmer MD

## 2023-06-13 NOTE — ED Notes (Signed)
First nurse note: Pt to ED brought by police, voluntary for insomnia since several days and is requesting medication refill and hearing voices. Per police officer, not SI or HI. Pt appears calm and cooperative in lobby.

## 2023-06-13 NOTE — H&P (Signed)
History and Physical    [name] ZOX:[id number] DOB: [birth year] DOA: 06/13/2023  PCP: Emogene Morgan, MD (Confirm with patient/family/NH records and if not entered, this has to be entered at [facility] point of entry) Patient coming from: Group Home  I have personally briefly reviewed patient's old medical records in [facility]  Chief Complaint: Cough, wheezing SOB, can not sleep  HPI: [name] is a 62 y.o. male with medical history significant of COPD Gold stage II in 2015, HTN, HLD, anxiety/depression, left upper lung mass presented with multiple complaints including persistent insomnia, cough, wheezing.  Symptoms started 3 days ago.  Patient started to have cough wheezing shortness of breath.  He also complaining about visual hallucination and difficulty to sleep.  Denies any suicidal ideation.  No fever or chills or chest pains. ED Course: Borderline tachycardia, nonhypoxic, chest x-ray showed no acute infiltrates.  VBG showed 7.4 1/41/48, WBC 6.2, hemoglobin 10.6, creatinine 0.6.  Patient was given IV Solu-Medrol, breathing treatment IV antibiotics ceftriaxone and azithromycin in the ED.  Review of Systems: As per HPI otherwise 14 point review of systems negative.    Past Medical History:  Diagnosis Date   Acute on chronic respiratory failure with hypoxia (HCC)    Asthma    Coagulation disorder (HCC)    COPD (chronic obstructive pulmonary disease) (HCC)    Depression    History of hiatal hernia    Hypertension    Hypokalemia    Leukocytosis    Lung mass    Pneumonia    Schizophrenia (HCC)    Shortness of breath dyspnea    Stroke [facility])    Umbilical hernia    Ventral hernia     Past Surgical History:  Procedure Laterality Date   COLONOSCOPY WITH PROPOFOL N/A 09/21/2021   Procedure: COLONOSCOPY WITH PROPOFOL;  Surgeon: Regis Bill, MD;  Location: [facility] ENDOSCOPY;  Service: Endoscopy;  Laterality: N/A;   HERNIA REPAIR     INSERTION OF MESH N/A 12/18/2014    Procedure: INSERTION OF MESH;  Surgeon: Lattie Haw, MD;  Location: [facility] ORS;  Service: General;  Laterality: N/A;   SUPRA-UMBILICAL HERNIA  12/18/2014   Procedure: SUPRA-UMBILICAL HERNIA;  Surgeon: Lattie Haw, MD;  Location: [facility] ORS;  Service: General;;   UMBILICAL HERNIA REPAIR N/A 12/18/2014   Procedure: HERNIA REPAIR UMBILICAL ADULT;  Surgeon: Lattie Haw, MD;  Location: [facility] ORS;  Service: General;  Laterality: N/A;   VIDEO BRONCHOSCOPY WITH ENDOBRONCHIAL ULTRASOUND N/A 03/15/2023   Procedure: VIDEO BRONCHOSCOPY WITH ENDOBRONCHIAL ULTRASOUND;  Surgeon: Vida Rigger, MD;  Location: [facility] ORS;  Service: Thoracic;  Laterality: N/A;     reports that he has been smoking cigarettes. He has a 6.8 pack-year smoking history. He has never used smokeless tobacco. He reports that he does not currently use alcohol after a past usage of about 75.0 standard drinks of alcohol per week. He reports that he does not currently use drugs after having used the following drugs: Marijuana and "Crack" cocaine.  No Known Allergies  Family History  Problem Relation Age of Onset   Asthma Mother    Hypertension Father      Prior to Admission medications   Medication Sig Start Date End Date Taking? Authorizing Provider  albuterol (VENTOLIN HFA) 108 (90 Base) MCG/ACT inhaler Inhale 2 puffs into the lungs every 4 (four) hours as needed for wheezing or shortness of breath.    [provider]  aspirin EC 81 MG tablet  Take 81 mg by mouth daily. Swallow whole.    [provider]  clonazePAM (KLONOPIN) 1 MG tablet Take 1 tablet (1 mg total) by mouth at bedtime. 04/07/23   Sarina Ill, DO  clopidogrel (PLAVIX) 75 MG tablet Take 1 tablet (75 mg total) by mouth daily. 04/08/23   Sarina Ill, DO  dextromethorphan-guaiFENesin (ROBITUSSIN-DM) 10-100 MG/5ML liquid Take 10 mLs by mouth every 6 (six) hours as needed for cough.    [provider]  diltiazem (CARDIZEM  CD) 240 MG 24 hr capsule Take 1 capsule (240 mg total) by mouth daily. 04/08/23   Sarina Ill, DO  docusate sodium (COLACE) 100 MG capsule Take 100 mg by mouth 2 (two) times daily as needed for mild constipation.    [provider]  famotidine (PEPCID) 20 MG tablet Take 1 tablet (20 mg total) by mouth daily. 12/15/21   Pennie Banter, DO  Fluticasone-Umeclidin-Vilant (TRELEGY ELLIPTA) 100-62.5-25 MCG/ACT AEPB Inhale 1 puff into the lungs daily. 12/14/21   Pennie Banter, DO  guaiFENesin (MUCINEX) 600 MG 12 hr tablet Take 600 mg by mouth 2 (two) times daily as needed.    [provider]  ipratropium-albuterol (DUONEB) 0.5-2.5 (3) MG/3ML SOLN Take 3 mLs by nebulization 3 (three) times daily. 06/03/23 07/03/23  Lucile Shutters, MD  lovastatin (MEVACOR) 20 MG tablet Take 20 mg by mouth at bedtime.    [provider]  mirtazapine (REMERON) 30 MG tablet Take 1 tablet (30 mg total) by mouth at bedtime. 04/07/23   Sarina Ill, DO  montelukast (SINGULAIR) 10 MG tablet Take 1 tablet (10 mg total) by mouth at bedtime. 12/14/21   Pennie Banter, DO  Omega-3 Fatty Acids (FISH OIL) 1000 MG CPDR Take 1,000 mg by mouth at bedtime.    [provider]  paliperidone (INVEGA) 9 MG 24 hr tablet Take 1 tablet (9 mg total) by mouth at bedtime. 04/07/23   Sarina Ill, DO  potassium chloride SA (KLOR-CON M) 20 MEQ tablet Take 1 tablet (20 mEq total) by mouth daily. 06/08/23   [name], MD  prochlorperazine (COMPAZINE) 10 MG tablet Take 1 tablet (10 mg total) by mouth every 6 (six) hours as needed for nausea or vomiting. 04/20/23   [name], MD  traZODone (DESYREL) 150 MG tablet Take 1 tablet (150 mg total) by mouth at bedtime as needed for sleep. 04/07/23   Sarina Ill, DO    Physical Exam: Vitals:   06/13/23 1239 06/13/23 1240 06/13/23 1330  BP: (!) 112/95  (!) 117/59  Pulse: (!) 116  (!) 106  Resp: 18  16  Temp: 97.9 F (36.6 C)     TempSrc: Oral    SpO2: 99%  96%  Weight:  105.2 kg   Height:  6\' 1"  (1.854 m)     Constitutional: NAD, calm, comfortable Vitals:   06/13/23 1239 06/13/23 1240 06/13/23 1330  BP: (!) 112/95  (!) 117/59  Pulse: (!) 116  (!) 106  Resp: 18  16  Temp: 97.9 F (36.6 C)    TempSrc: Oral    SpO2: 99%  96%  Weight:  105.2 kg   Height:  6\' 1"  (1.854 m)    Eyes: PERRL, lids and conjunctivae normal ENMT: Mucous membranes are moist. Posterior pharynx clear of any exudate or lesions.Normal dentition.  Neck: normal, supple, no masses, no thyromegaly Respiratory: Diminished breath sound bilaterally, diffused wheezing, no crackles.  Increasing respiratory effort. No accessory muscle use.  Cardiovascular: Regular rate and rhythm, no murmurs / rubs / gallops. No extremity edema. 2+ pedal pulses. No carotid bruits.  Abdomen: no tenderness, no masses palpated. No hepatosplenomegaly. Bowel sounds positive.  Musculoskeletal: no clubbing / cyanosis. No joint deformity upper and lower extremities. Good ROM, no contractures. Normal muscle tone.  Skin: no rashes, lesions, ulcers. No induration Neurologic: CN 2-12 grossly intact. Sensation intact, DTR normal. Strength 5/5 in all 4.  Psychiatric: Normal judgment and insight. Alert and oriented x 3. Normal mood.     Labs on Admission: I have personally reviewed following labs and imaging studies  CBC: Recent Labs  Lab 06/08/23 0850 06/13/23 1241  WBC 10.9* 6.2  NEUTROABS 9.7*  --   HGB 11.5* 11.6*  HCT 35.1* 35.1*  MCV 95.1 94.9  PLT 161 157   Basic Metabolic Panel: Recent Labs  Lab 06/08/23 0850 06/13/23 1241  NA 140 137  K 3.3* 4.0  CL 105 104  CO2 25 24  GLUCOSE 117* 102*  BUN 12 12  CREATININE 0.69 0.67  CALCIUM 9.2 9.1   GFR: Estimated Creatinine Clearance: 118.8 mL/min (by C-G formula based on SCr of 0.67 mg/dL). Liver Function Tests: Recent Labs  Lab 06/08/23 0850 06/13/23 1241  AST 23 19  ALT 28 26  ALKPHOS 52 60   BILITOT 0.6 0.9  PROT 6.3* 6.9  ALBUMIN 3.5 3.7   No results for input(s): "LIPASE", "AMYLASE" in the last 168 hours. No results for input(s): "AMMONIA" in the last 168 hours. Coagulation Profile: No results for input(s): "INR", "PROTIME" in the last 168 hours. Cardiac Enzymes: No results for input(s): "CKTOTAL", "CKMB", "CKMBINDEX", "TROPONINI" in the last 168 hours. BNP (last 3 results) No results for input(s): "PROBNP" in the last 8760 hours. HbA1C: No results for input(s): "HGBA1C" in the last 72 hours. CBG: No results for input(s): "GLUCAP" in the last 168 hours. Lipid Profile: No results for input(s): "CHOL", "HDL", "LDLCALC", "TRIG", "CHOLHDL", "LDLDIRECT" in the last 72 hours. Thyroid Function Tests: No results for input(s): "TSH", "T4TOTAL", "FREET4", "T3FREE", "THYROIDAB" in the last 72 hours. Anemia Panel: No results for input(s): "VITAMINB12", "FOLATE", "FERRITIN", "TIBC", "IRON", "RETICCTPCT" in the last 72 hours. Urine analysis:    Component Value Date/Time   COLORURINE YELLOW 05/25/2023 0927   APPEARANCEUR CLEAR 05/25/2023 0927   APPEARANCEUR Clear 12/27/2013 1921   LABSPEC 1.025 05/25/2023 0927   LABSPEC 1.002 12/27/2013 1921   PHURINE 6.0 05/25/2023 0927   GLUCOSEU NEGATIVE 05/25/2023 0927   GLUCOSEU Negative 12/27/2013 1921   HGBUR TRACE (A) 05/25/2023 0927   BILIRUBINUR NEGATIVE 05/25/2023 0927   BILIRUBINUR Negative 12/27/2013 1921   KETONESUR NEGATIVE 05/25/2023 0927   PROTEINUR 30 (A) 05/25/2023 0927   NITRITE POSITIVE (A) 05/25/2023 0927   LEUKOCYTESUR TRACE (A) 05/25/2023 0927   LEUKOCYTESUR Negative 12/27/2013 1921    Radiological Exams on Admission: No results found.  EKG: Independently reviewed.  Sinus arrhythmia, no acute ST changes.  Assessment/Plan Principal Problem:   Schizophrenia (HCC) Active Problems:   COPD with acute exacerbation (HCC)   COPD (chronic obstructive pulmonary disease) (HCC)  (please populate well all problems  here in Problem List. (For example, if patient is on BP meds at home and you resume or decide to hold them, it is a problem that needs to be her. Same for CAD, COPD, HLD and so on)  Acute COPD exacerbation -Change antibiotics to doxycycline as patient was recently treated with Rocephin and azithromycin 2 weeks ago. -Incentive spirometry -Short course of  p.o. steroid -Continue ICS, LABA and DuoNebs every 6 hours and as needed albuterol  Insomnia History of schizophrenia -Discussed with on-call psychiatry, patient is involuntarily committed -Likely can go to inpatient psychiatry tomorrow if COPD symptoms stabilized. 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

## 2023-06-13 NOTE — ED Notes (Signed)
Pt provided with hospital meal tray. 100% of meal consumed. Pt tolerated without complaints. Tray disposed of appropriately.

## 2023-06-13 NOTE — Progress Notes (Signed)
PHARMACIST - PHYSICIAN COMMUNICATION  CONCERNING:  Enoxaparin (Lovenox) for DVT Prophylaxis   RECOMMENDATION: Patient was prescribed enoxaparin 40mg  q24 hours for VTE prophylaxis.   Filed Weights   06/13/23 1240  Weight: 105.2 kg (232 lb)    Body mass index is 30.61 kg/m.  Estimated Creatinine Clearance: 118.8 mL/min (by C-G formula based on SCr of 0.67 mg/dL).  Based on [facility] policy patient is candidate for enoxaparin 0.5mg /kg TBW SQ every 24 hours based on BMI being >30.  DESCRIPTION: Pharmacy has adjusted enoxaparin dose per [facility] policy.  Patient is now receiving enoxaparin 50 mg every 24 hours   Tressie Ellis 06/13/2023 3:40 PM

## 2023-06-13 NOTE — Consult Note (Cosign Needed Addendum)
[facility] Face-to-Face Psychiatry Consult   Reason for Consult:  Admit Referring Physician:  Dr. Pilar Jarvis Patient Identification: [name] MRN:  [id number] Principal Diagnosis: Schizophrenia [facility]) Diagnosis:  Principal Problem:   Schizophrenia (HCC)  Total Time spent with patient: 45 minutes  Subjective:   [name] is a 62 y.o. male patient w/ history of adenocarcinoma of left lung, copd, schizophrenia, admitted to [facility] on 06/13/23, per RN triage note:   Pt sts that he is here for a medication refill and that he is hearing voices. Pt sts that the voices are telling him to kill himself. Pt has not plan or action. Pt denies any SI or HI thoughts.      HPI:   Pt chart reviewed and assessed face to face. Pt reports [facility] to [facility] with [facility] for "hearing voices, haven't slept in 4 days". Reports poor sleep and appetite. Reports worsening auditory hallucinations for the past 2 days. Auditory hallucinations are command in nature and tell him to "kill myself, to cut my throat", to "hurt people in the house, jump on them". He does not want to act on these commands, although reports they are hard to ignore. He has been experiencing suicidal and homicidal ideations as a result. He reports he has also been experiencing visual hallucinations of "dead people". Denies current auditory visual hallucinations, last experienced them 20 minutes ago. He reports he is also experiencing paranoia, but states he cannot describe it and is "just paranoid".   Pt currently resides in a group home, [facility]. Reports he has been living at the group home for about 3 years and likes it there. Reports he was with group home staff, Marlett, prior to being brought to the hospital. He gives verbal consent for his group home to be contacted and provides phone number as [phone].  He reports remote history of suicide attempt, last occurring "long time ago", when he cut his arm. He denies history of non  suicidal self injurious behavior. He reports past inpatient psychiatric hospitalizations. Per chart review, he had a recent inpatient psychiatric hospitalization at [facility] from 03/25/23-04/07/23 after presenting to the emergency department, had told the emergency department at that time he was experiencing AV/H and had not slept in 4 days.   Pt denies knowledge of family psychiatric history.   He states his sister, brother, and daughter live in [place].   He denies use of alcohol, marijuana, crack/cocaine, methamphetamines or other substances.   Collateral per group home staff, Marlett, [phone]. Per Marlett, pt came home from cancer treatment today and reported being overwhelmed by the voices and concerns he would hurt himself. States she called 911 and pt was transported to the emergency department. Pt is allowed to return to the group home once he is medically and psychiatrically stabilized. Pt is non-violent and has not hurt anybody in the home. She states pt is medication adherent, have been taking them as prescribed, and does not refuse his medications.  Past Psychiatric History: hx of schizophrenia  Risk to Self: Yes Risk to Others: Yes Prior Inpatient Therapy: Yes Prior Outpatient Therapy: None reported  Past Medical History:  Past Medical History:  Diagnosis Date   Acute on chronic respiratory failure with hypoxia (HCC)    Asthma    Coagulation disorder (HCC)    COPD (chronic obstructive pulmonary disease) (HCC)    Depression    History of hiatal hernia    Hypertension    Hypokalemia    Leukocytosis  Lung mass    Pneumonia    Schizophrenia (HCC)    Shortness of breath dyspnea    Stroke [facility])    Umbilical hernia    Ventral hernia     Past Surgical History:  Procedure Laterality Date   COLONOSCOPY WITH PROPOFOL N/A 09/21/2021   Procedure: COLONOSCOPY WITH PROPOFOL;  Surgeon: Regis Bill, MD;  Location: [facility] ENDOSCOPY;  Service: Endoscopy;  Laterality: N/A;    HERNIA REPAIR     INSERTION OF MESH N/A 12/18/2014   Procedure: INSERTION OF MESH;  Surgeon: Lattie Haw, MD;  Location: [facility] ORS;  Service: General;  Laterality: N/A;   SUPRA-UMBILICAL HERNIA  12/18/2014   Procedure: SUPRA-UMBILICAL HERNIA;  Surgeon: Lattie Haw, MD;  Location: [facility] ORS;  Service: General;;   UMBILICAL HERNIA REPAIR N/A 12/18/2014   Procedure: HERNIA REPAIR UMBILICAL ADULT;  Surgeon: Lattie Haw, MD;  Location: [facility] ORS;  Service: General;  Laterality: N/A;   VIDEO BRONCHOSCOPY WITH ENDOBRONCHIAL ULTRASOUND N/A 03/15/2023   Procedure: VIDEO BRONCHOSCOPY WITH ENDOBRONCHIAL ULTRASOUND;  Surgeon: Vida Rigger, MD;  Location: [facility] ORS;  Service: Thoracic;  Laterality: N/A;   Family History:  Family History  Problem Relation Age of Onset   Asthma Mother    Hypertension Father    Family Psychiatric  History: denies knowledge Social History:  Social History   Substance and Sexual Activity  Alcohol Use Not Currently   Alcohol/week: 75.0 standard drinks of alcohol   Types: 75 Cans of beer per week     Social History   Substance and Sexual Activity  Drug Use Not Currently   Types: Marijuana, "Crack" cocaine   Comment: none in 15 yrs    Social History   Socioeconomic History   Marital status: Widowed    Spouse name: Not on file   Number of children: Not on file   Years of education: Not on file   Highest education level: Not on file  Occupational History   Not on file  Tobacco Use   Smoking status: Every Day    Current packs/day: 0.15    Average packs/day: 0.2 packs/day for 45.0 years (6.8 ttl pk-yrs)    Types: Cigarettes   Smokeless tobacco: Never  Vaping Use   Vaping status: Never Used  Substance and Sexual Activity   Alcohol use: Not Currently    Alcohol/week: 75.0 standard drinks of alcohol    Types: 75 Cans of beer per week   Drug use: Not Currently    Types: Marijuana, "Crack" cocaine    Comment: none in 15 yrs   Sexual activity:  Not Currently  Other Topics Concern   Not on file  Social History Narrative   Not on file   Social Determinants of Health   Financial Resource Strain: Not on file  Food Insecurity: No Food Insecurity (05/29/2023)   Hunger Vital Sign    Worried About Running Out of Food in the Last Year: Never true    Ran Out of Food in the Last Year: Never true  Transportation Needs: No Transportation Needs (05/29/2023)   PRAPARE - Administrator, Civil Service (Medical): No    Lack of Transportation (Non-Medical): No  Physical Activity: Not on file  Stress: Not on file  Social Connections: Not on file   Additional Social History:    Allergies:  No Known Allergies  Labs:  Results for orders placed or performed during the hospital encounter of 06/13/23 (from the past 48 hour(s))  Comprehensive  metabolic panel     Status: Abnormal   Collection Time: 06/13/23 12:41 PM  Result Value Ref Range   Sodium 137 135 - 145 mmol/L   Potassium 4.0 3.5 - 5.1 mmol/L   Chloride 104 98 - 111 mmol/L   CO2 24 22 - 32 mmol/L   Glucose, Bld 102 (H) 70 - 99 mg/dL    Comment: Glucose reference range applies only to samples taken after fasting for at least 8 hours.   BUN 12 8 - 23 mg/dL   Creatinine, Ser 0.45 0.61 - 1.24 mg/dL   Calcium 9.1 8.9 - 40.9 mg/dL   Total Protein 6.9 6.5 - 8.1 g/dL   Albumin 3.7 3.5 - 5.0 g/dL   AST 19 15 - 41 U/L   ALT 26 0 - 44 U/L   Alkaline Phosphatase 60 38 - 126 U/L   Total Bilirubin 0.9 <1.2 mg/dL   GFR, Estimated >81 >19 mL/min    Comment: (NOTE) Calculated using the CKD-EPI Creatinine Equation (2021)    Anion gap 9 5 - 15    Comment: Performed at [facility], [address] [place], [place] [address]  Ethanol     Status: None   Collection Time: 06/13/23 12:41 PM  Result Value Ref Range   Alcohol, Ethyl (B) <10 <10 mg/dL    Comment: (NOTE) Lowest detectable limit for serum alcohol is 10 mg/dL.  For medical purposes only. Performed at  [facility], [address] [place], [place] [address]   Salicylate level     Status: Abnormal   Collection Time: 06/13/23 12:41 PM  Result Value Ref Range   Salicylate Lvl <7.0 (L) 7.0 - 30.0 mg/dL    Comment: Performed at [facility], [address] [place], [place] [address]  Acetaminophen level     Status: Abnormal   Collection Time: 06/13/23 12:41 PM  Result Value Ref Range   Acetaminophen (Tylenol), Serum <10 (L) 10 - 30 ug/mL    Comment: (NOTE) Therapeutic concentrations vary significantly. A range of 10-30 ug/mL  may be an effective concentration for many patients. However, some  are best treated at concentrations outside of this range. Acetaminophen concentrations >150 ug/mL at 4 hours after ingestion  and >50 ug/mL at 12 hours after ingestion are often associated with  toxic reactions.  Performed at [facility], [address] [place], [place] [address]   cbc     Status: Abnormal   Collection Time: 06/13/23 12:41 PM  Result Value Ref Range   WBC 6.2 4.0 - 10.5 K/uL   RBC 3.70 (L) 4.22 - 5.81 MIL/uL   Hemoglobin 11.6 (L) 13.0 - 17.0 g/dL   HCT 62.9 (L) 52.8 - 41.3 %   MCV 94.9 80.0 - 100.0 fL   MCH 31.4 26.0 - 34.0 pg   MCHC 33.0 30.0 - 36.0 g/dL   RDW 24.4 (H) 01.0 - 27.2 %   Platelets 157 150 - 400 K/uL   nRBC 0.0 0.0 - 0.2 %    Comment: Performed at [facility], [address], [place], [place] [address]  Urine Drug Screen, Qualitative     Status: None   Collection Time: 06/13/23 12:41 PM  Result Value Ref Range   Tricyclic, Ur Screen NONE DETECTED NONE DETECTED   Amphetamines, Ur Screen NONE DETECTED NONE DETECTED   MDMA (Ecstasy)Ur Screen NONE DETECTED NONE DETECTED   Cocaine Metabolite,Ur [place] NONE DETECTED NONE DETECTED   Opiate, Ur Screen NONE DETECTED NONE  DETECTED   Phencyclidine (PCP) Ur S NONE DETECTED NONE DETECTED   Cannabinoid 50 Ng, Ur  NONE DETECTED NONE DETECTED   Barbiturates, Ur Screen NONE  DETECTED NONE DETECTED   Benzodiazepine, Ur Scrn NONE DETECTED NONE DETECTED   Methadone Scn, Ur NONE DETECTED NONE DETECTED    Comment: (NOTE) Tricyclics + metabolites, urine    Cutoff 1000 ng/mL Amphetamines + metabolites, urine  Cutoff 1000 ng/mL MDMA (Ecstasy), urine              Cutoff 500 ng/mL Cocaine Metabolite, urine          Cutoff 300 ng/mL Opiate + metabolites, urine        Cutoff 300 ng/mL Phencyclidine (PCP), urine         Cutoff 25 ng/mL Cannabinoid, urine                 Cutoff 50 ng/mL Barbiturates + metabolites, urine  Cutoff 200 ng/mL Benzodiazepine, urine              Cutoff 200 ng/mL Methadone, urine                   Cutoff 300 ng/mL  The urine drug screen provides only a preliminary, unconfirmed analytical test result and should not be used for non-medical purposes. Clinical consideration and professional judgment should be applied to any positive drug screen result due to possible interfering substances. A more specific alternate chemical method must be used in order to obtain a confirmed analytical result. Gas chromatography / mass spectrometry (GC/MS) is the preferred confirm atory method. Performed at [facility], [address] [place], [place] [address]   Blood gas, venous     Status: Abnormal   Collection Time: 06/13/23  1:23 PM  Result Value Ref Range   pH, Ven 7.41 7.25 - 7.43   pCO2, Ven 41 (L) 44 - 60 mmHg   pO2, Ven 48 (H) 32 - 45 mmHg   Bicarbonate 26.0 20.0 - 28.0 mmol/L   Acid-Base Excess 1.2 0.0 - 2.0 mmol/L   O2 Saturation 81.8 %   Patient temperature 37.0    Collection site VENOUS     Comment: Performed at [facility], [address], [place], [place] [address]    Current Facility-Administered Medications  Medication Dose Route Frequency Provider Last Rate Last Admin   azithromycin (ZITHROMAX) 500 mg in dextrose 5 % 250 mL IVPB  500 mg Intravenous Once Pilar Jarvis, MD       cefTRIAXone (ROCEPHIN) 1 g in sodium  chloride 0.9 % 100 mL IVPB  1 g Intravenous Once Pilar Jarvis, MD 200 mL/hr at 06/13/23 1333 1 g at 06/13/23 1333   ipratropium-albuterol (DUONEB) 0.5-2.5 (3) MG/3ML nebulizer solution            Current Outpatient Medications  Medication Sig Dispense Refill   albuterol (VENTOLIN HFA) 108 (90 Base) MCG/ACT inhaler Inhale 2 puffs into the lungs every 4 (four) hours as needed for wheezing or shortness of breath.     aspirin EC 81 MG tablet Take 81 mg by mouth daily. Swallow whole.     clonazePAM (KLONOPIN) 1 MG tablet Take 1 tablet (1 mg total) by mouth at bedtime. 30 tablet 0   clopidogrel (PLAVIX) 75 MG tablet Take 1 tablet (75 mg total) by mouth daily. 30 tablet 3   dextromethorphan-guaiFENesin (ROBITUSSIN-DM) 10-100 MG/5ML liquid Take 10 mLs by mouth every 6 (six) hours as needed for cough.  diltiazem (CARDIZEM CD) 240 MG 24 hr capsule Take 1 capsule (240 mg total) by mouth daily. 30 capsule 3   docusate sodium (COLACE) 100 MG capsule Take 100 mg by mouth 2 (two) times daily as needed for mild constipation.     famotidine (PEPCID) 20 MG tablet Take 1 tablet (20 mg total) by mouth daily. 30 tablet 0   Fluticasone-Umeclidin-Vilant (TRELEGY ELLIPTA) 100-62.5-25 MCG/ACT AEPB Inhale 1 puff into the lungs daily. 28 each 1   guaiFENesin (MUCINEX) 600 MG 12 hr tablet Take 600 mg by mouth 2 (two) times daily as needed.     ipratropium-albuterol (DUONEB) 0.5-2.5 (3) MG/3ML SOLN Take 3 mLs by nebulization 3 (three) times daily. 270 mL 0   lovastatin (MEVACOR) 20 MG tablet Take 20 mg by mouth at bedtime.     mirtazapine (REMERON) 30 MG tablet Take 1 tablet (30 mg total) by mouth at bedtime. 30 tablet 3   montelukast (SINGULAIR) 10 MG tablet Take 1 tablet (10 mg total) by mouth at bedtime. 30 tablet 1   Omega-3 Fatty Acids (FISH OIL) 1000 MG CPDR Take 1,000 mg by mouth at bedtime.     paliperidone (INVEGA) 9 MG 24 hr tablet Take 1 tablet (9 mg total) by mouth at bedtime. 30 tablet 3   potassium chloride  SA (KLOR-CON M) 20 MEQ tablet Take 1 tablet (20 mEq total) by mouth daily. 3 tablet 0   prochlorperazine (COMPAZINE) 10 MG tablet Take 1 tablet (10 mg total) by mouth every 6 (six) hours as needed for nausea or vomiting. 90 tablet 0   traZODone (DESYREL) 150 MG tablet Take 1 tablet (150 mg total) by mouth at bedtime as needed for sleep. 30 tablet 3   Musculoskeletal: Strength & Muscle Tone:  pt lying down on assessment Gait & Station:  pt lying down on assessment Patient leans:  pt lying down on assessment  Psychiatric Specialty Exam:  Presentation  General Appearance:  Appropriate for Environment  Eye Contact: Fair  Speech: Clear and Coherent  Speech Volume: Decreased  Handedness: Right   Mood and Affect  Mood: Depressed; Anxious  Affect: Constricted   Thought Process  Thought Processes: Coherent; Goal Directed; Linear  Descriptions of Associations:Intact  Orientation:Full (Time, Place and Person)  Thought Content:Logical  History of Schizophrenia/Schizoaffective disorder:Yes  Duration of Psychotic Symptoms:Greater than six months  Hallucinations:Hallucinations: Auditory; Command; Visual Description of Command Hallucinations: "kill myself, to cut my throat", "hurt people in the house, jump on them" Description of Auditory Hallucinations: "kill myself, to cut my throat", "hurt people in the house, jump on them" Description of Visual Hallucinations: "dead people"  Ideas of Reference:Paranoia; Other (comment) ("just paranoid")  Suicidal Thoughts:Suicidal Thoughts: Yes, Active SI Active Intent and/or Plan: With Plan  Homicidal Thoughts:Homicidal Thoughts: Yes, Active HI Active Intent and/or Plan: With Plan   Sensorium  Memory: Immediate Fair  Judgment: Intact  Insight: Present   Executive Functions  Concentration: Fair  Attention Span: Fair  Recall: [employer] of Knowledge: Fair  Language: Fair   Psychomotor Activity   Psychomotor Activity: Psychomotor Activity: Normal   Assets  Assets: Manufacturing systems engineer; Desire for Improvement; Housing   Sleep  Sleep: Sleep: Poor   Physical Exam: Physical Exam Constitutional:      Appearance: He is not diaphoretic.  Eyes:     General: No scleral icterus. Cardiovascular:     Rate and Rhythm: Tachycardia present.  Neurological:     Mental Status: He is alert and oriented to person,  place, and time.  Psychiatric:        Attention and Perception: Attention normal. He perceives auditory and visual hallucinations.        Mood and Affect: Mood is depressed.        Speech: Speech normal.        Behavior: Behavior normal. Behavior is cooperative.        Thought Content: Thought content is paranoid. Thought content includes homicidal and suicidal ideation.    Review of Systems  Constitutional:  Negative for chills and fever.  Respiratory:  Positive for shortness of breath.   Cardiovascular:  Negative for chest pain.  Psychiatric/Behavioral:  Positive for depression and hallucinations. The patient has insomnia.    Blood pressure (!) 117/59, pulse (!) 106, temperature 97.9 F (36.6 C), temperature source Oral, resp. rate 16, height 6\' 1"  (1.854 m), weight 105.2 kg, SpO2 96%. Body mass index is 30.61 kg/m.  Treatment Plan Summary: Daily contact with patient to assess and evaluate symptoms and progress in treatment, Medication management, and Plan    62 y.o. male patient w/ history of adenocarcinoma of left lung, copd, schizophrenia, admitted to [facility] on 06/13/23. On assessment, endorses worsening command auditory hallucinations to hurt self/others, visual hallucinations, suicidal/homicidal ideations. He meets criteria for inpatient psychiatric admission. Placed under IVC during emergency department admission. Has had prior admission to our geropsychiatry unit at [facility] and would be appropriate for admission once medically cleared. Have reached out to EDP, and  plan is first for medical admission. Pt is currently NPO. Once NPO has concluded, plan to restart remeron 30mg  at bedtime, invega 9mg  at bedtime, and trazodone 150mg  at bedtime prn sleep. Psychiatry to continue following and assist in coordination of inpatient psychiatric bed placement once medically cleared.   Disposition: Recommend psychiatric Inpatient admission when medically cleared. Supportive therapy provided about ongoing stressors.  Lauree Chandler, NP 06/13/2023 1:45 PM

## 2023-06-13 NOTE — ED Triage Notes (Signed)
Pt dressed out into burgundy scrubs.   Pt belongings - blue jacket -green shirt  - grey pants -black underwear - white shoes  - white socks

## 2023-06-13 NOTE — Progress Notes (Signed)
Patient arrived on unit from ED alert and oriented. No acute distress noted. Patient settled into room, assessment performed. Patient endorses hearing voices telling him to hurt himself but he ignores them. Patient denies suicidal ideations and/or plan. One to one sitter provided by unit. Will continue to monitor.

## 2023-06-13 NOTE — ED Triage Notes (Signed)
Pt sts that he is here for a medication refill and that he is hearing voices. Pt sts that the voices are telling him to kill himself. Pt has not plan or action. Pt denies any SI or HI thoughts.

## 2023-06-14 ENCOUNTER — Ambulatory Visit: Payer: 59

## 2023-06-14 ENCOUNTER — Encounter: Payer: Self-pay | Admitting: Oncology

## 2023-06-14 DIAGNOSIS — G4709 Other insomnia: Secondary | ICD-10-CM | POA: Diagnosis present

## 2023-06-14 DIAGNOSIS — R45850 Homicidal ideations: Secondary | ICD-10-CM | POA: Diagnosis present

## 2023-06-14 DIAGNOSIS — R911 Solitary pulmonary nodule: Secondary | ICD-10-CM | POA: Diagnosis present

## 2023-06-14 DIAGNOSIS — Z79899 Other long term (current) drug therapy: Secondary | ICD-10-CM | POA: Diagnosis not present

## 2023-06-14 DIAGNOSIS — F32A Depression, unspecified: Secondary | ICD-10-CM | POA: Diagnosis present

## 2023-06-14 DIAGNOSIS — Z8249 Family history of ischemic heart disease and other diseases of the circulatory system: Secondary | ICD-10-CM | POA: Diagnosis not present

## 2023-06-14 DIAGNOSIS — R000 Tachycardia, unspecified: Secondary | ICD-10-CM | POA: Diagnosis present

## 2023-06-14 DIAGNOSIS — Z8673 Personal history of transient ischemic attack (TIA), and cerebral infarction without residual deficits: Secondary | ICD-10-CM | POA: Diagnosis not present

## 2023-06-14 DIAGNOSIS — J441 Chronic obstructive pulmonary disease with (acute) exacerbation: Secondary | ICD-10-CM | POA: Diagnosis present

## 2023-06-14 DIAGNOSIS — R45851 Suicidal ideations: Secondary | ICD-10-CM | POA: Diagnosis present

## 2023-06-14 DIAGNOSIS — Z825 Family history of asthma and other chronic lower respiratory diseases: Secondary | ICD-10-CM | POA: Diagnosis not present

## 2023-06-14 DIAGNOSIS — Z8659 Personal history of other mental and behavioral disorders: Secondary | ICD-10-CM | POA: Diagnosis not present

## 2023-06-14 DIAGNOSIS — Z85118 Personal history of other malignant neoplasm of bronchus and lung: Secondary | ICD-10-CM | POA: Diagnosis not present

## 2023-06-14 DIAGNOSIS — E785 Hyperlipidemia, unspecified: Secondary | ICD-10-CM | POA: Diagnosis present

## 2023-06-14 DIAGNOSIS — F17210 Nicotine dependence, cigarettes, uncomplicated: Secondary | ICD-10-CM | POA: Diagnosis present

## 2023-06-14 DIAGNOSIS — I10 Essential (primary) hypertension: Secondary | ICD-10-CM | POA: Diagnosis present

## 2023-06-14 DIAGNOSIS — F203 Undifferentiated schizophrenia: Secondary | ICD-10-CM | POA: Diagnosis not present

## 2023-06-14 DIAGNOSIS — Z7902 Long term (current) use of antithrombotics/antiplatelets: Secondary | ICD-10-CM | POA: Diagnosis not present

## 2023-06-14 DIAGNOSIS — Z1152 Encounter for screening for COVID-19: Secondary | ICD-10-CM | POA: Diagnosis not present

## 2023-06-14 DIAGNOSIS — Z9151 Personal history of suicidal behavior: Secondary | ICD-10-CM | POA: Diagnosis not present

## 2023-06-14 DIAGNOSIS — Z7982 Long term (current) use of aspirin: Secondary | ICD-10-CM | POA: Diagnosis not present

## 2023-06-14 DIAGNOSIS — F209 Schizophrenia, unspecified: Secondary | ICD-10-CM | POA: Diagnosis present

## 2023-06-14 DIAGNOSIS — Z923 Personal history of irradiation: Secondary | ICD-10-CM | POA: Diagnosis not present

## 2023-06-14 LAB — HIV ANTIBODY (ROUTINE TESTING W REFLEX): HIV Screen 4th Generation wRfx: NONREACTIVE

## 2023-06-14 MED ORDER — ORAL CARE MOUTH RINSE: 15.0000 mL | OROMUCOSAL | Status: DC | PRN

## 2023-06-14 MED ORDER — PALIPERIDONE ER 3 MG PO TB24
12.0000 mg | ORAL_TABLET | Freq: Every day | ORAL | Status: DC
Start: 2023-06-14 — End: 2023-06-16
  Administered 2023-06-14 – 2023-06-15 (×2): 12 mg via ORAL
  Filled 2023-06-14 (×2): qty 4

## 2023-06-14 MED ORDER — TRAZODONE HCL 100 MG PO TABS
200.0000 mg | ORAL_TABLET | Freq: Every evening | ORAL | Status: DC | PRN
  Administered 2023-06-14 – 2023-06-15 (×2): 200 mg via ORAL
  Filled 2023-06-14 (×2): qty 2

## 2023-06-14 NOTE — TOC Progression Note (Addendum)
Transition of Care [facility]) - Progression Note    Patient Details  Name: [name] MRN: [id number] Date of Birth: 1961/06/19  Transition of [facility]) CM/SW Contact  Truddie Hidden, RN Phone Number: 06/14/2023, 11:04 AM  Clinical Narrative:    Spoke with group home owner, [name]  @ [phone]. Patient will be returning to [facility] group home. [name] inquired about changes in discharge medication. He as advised changes in medications can't be determined at this time. He is requesting a hard copy of patient's discharge summary at discharge. RCNM attempted to explain the  discharge summary can be sent electronically and provided by hard copy. [name] became with irate  and disconnected the call.          Expected Discharge Plan and Services                                               Social Determinants of Health (SDOH) Interventions SDOH Screenings   Food Insecurity: Patient Declined (06/13/2023)  Housing: Patient Declined (06/13/2023)  Transportation Needs: Patient Declined (06/13/2023)  Utilities: Patient Declined (06/13/2023)  Depression (PHQ2-9): Low Risk  (02/03/2023)  Tobacco Use: High Risk (06/13/2023)    Readmission Risk Interventions    05/26/2023    2:38 PM  Readmission Risk Prevention Plan  Transportation Screening Complete  PCP or Specialist Appt within 3-5 Days Complete  HRI or Home Care Consult Complete  Social Work Consult for Recovery Care Planning/Counseling Complete  Palliative Care Screening Not Applicable  Medication Review [employer]) Complete

## 2023-06-14 NOTE — Plan of Care (Signed)
Will continue to follow plan of care.   Problem: Education: Goal: Knowledge of disease or condition will improve Outcome: Progressing Goal: Knowledge of the prescribed therapeutic regimen will improve Outcome: Progressing Goal: Individualized Educational Video(s) Outcome: Progressing   Problem: Activity: Goal: Ability to tolerate increased activity will improve Outcome: Progressing Goal: Will verbalize the importance of balancing activity with adequate rest periods Outcome: Progressing   Problem: Respiratory: Goal: Ability to maintain a clear airway will improve Outcome: Progressing Goal: Levels of oxygenation will improve Outcome: Progressing Goal: Ability to maintain adequate ventilation will improve Outcome: Progressing   Problem: Education: Goal: Knowledge of General Education information will improve Description: Including pain rating scale, medication(s)/side effects and non-pharmacologic comfort measures Outcome: Progressing   Problem: Health Behavior/Discharge Planning: Goal: Ability to manage health-related needs will improve Outcome: Progressing   Problem: Clinical Measurements: Goal: Ability to maintain clinical measurements within normal limits will improve Outcome: Progressing Goal: Will remain free from infection Outcome: Progressing Goal: Diagnostic test results will improve Outcome: Progressing Goal: Respiratory complications will improve Outcome: Progressing Goal: Cardiovascular complication will be avoided Outcome: Progressing   Problem: Activity: Goal: Risk for activity intolerance will decrease Outcome: Progressing   Problem: Nutrition: Goal: Adequate nutrition will be maintained Outcome: Progressing   Problem: Coping: Goal: Level of anxiety will decrease Outcome: Progressing   Problem: Elimination: Goal: Will not experience complications related to bowel motility Outcome: Progressing Goal: Will not experience complications related to  urinary retention Outcome: Progressing   Problem: Pain Management: Goal: General experience of comfort will improve Outcome: Progressing   Problem: Safety: Goal: Ability to remain free from injury will improve Outcome: Progressing   Problem: Skin Integrity: Goal: Risk for impaired skin integrity will decrease Outcome: Progressing

## 2023-06-14 NOTE — Consult Note (Signed)
[facility] Face-to-Face Psychiatry Consult   Reason for Consult:  Admit Referring Physician:  Dr. Pilar Jarvis Patient Identification: [name] MRN:  [id number] Principal Diagnosis: Schizophrenia [facility]) Diagnosis:  Principal Problem:   Schizophrenia (HCC) Active Problems:   COPD with acute exacerbation (HCC)   COPD (chronic obstructive pulmonary disease) (HCC)   Total Time spent with patient:  25 minutes  Subjective:   [name] is a 62 y.o. male patient w/ history of adenocarcinoma of left lung, copd, schizophrenia, admitted to [facility] on 06/13/23, per RN triage note:  Pt sts that he is here for a medication refill and that he is hearing voices. Pt sts that the voices are telling him to kill himself. Pt has not plan or action. Pt denies any SI or HI thoughts.  Pt subsequently admitted to medical floor for COPD with acute exacerbation.  HPI:   Pt chart reviewed and seen on rounds. He is calm, cooperative, pleasant. Pt reports he is feeling "the same" as yesterday's assessment. Confirms he continues to feel depressed, anxious. Endorses continued command auditory hallucinations, last experienced last night, telling him to "kill myself". He reports continued suicidal ideations due to command auditory hallucinations. He denies command auditory hallucinations to hurt others. He denies homicidal ideations. He continues to report paranoia, today describes as "just anxious". Reports appetite is "good", is waiting for breakfast this morning, ate dinner last night. Sleep continues to be poor. Reviewed with pt recommendation for inpatient psychiatric admission once he is medically clear and he verbalized understanding. We discussed possible medication changes, including increasing invega from 9mg  to 12mg  to address hallucinations as well as increasing trazodone from 150mg  to 200mg  to address mood and sleep. He is in agreement. Will make these changes today.   Past Psychiatric History: hx of  schizophrenia  Risk to Self: Yes Risk to Others: No Prior Inpatient Therapy: Yes Prior Outpatient Therapy: None reported  Past Medical History:  Past Medical History:  Diagnosis Date   Acute on chronic respiratory failure with hypoxia (HCC)    Asthma    Coagulation disorder (HCC)    COPD (chronic obstructive pulmonary disease) (HCC)    Depression    History of hiatal hernia    Hypertension    Hypokalemia    Leukocytosis    Lung mass    Pneumonia    Schizophrenia (HCC)    Shortness of breath dyspnea    Stroke [facility])    Umbilical hernia    Ventral hernia     Past Surgical History:  Procedure Laterality Date   COLONOSCOPY WITH PROPOFOL N/A 09/21/2021   Procedure: COLONOSCOPY WITH PROPOFOL;  Surgeon: Regis Bill, MD;  Location: [facility] ENDOSCOPY;  Service: Endoscopy;  Laterality: N/A;   HERNIA REPAIR     INSERTION OF MESH N/A 12/18/2014   Procedure: INSERTION OF MESH;  Surgeon: Lattie Haw, MD;  Location: [facility] ORS;  Service: General;  Laterality: N/A;   SUPRA-UMBILICAL HERNIA  12/18/2014   Procedure: SUPRA-UMBILICAL HERNIA;  Surgeon: Lattie Haw, MD;  Location: [facility] ORS;  Service: General;;   UMBILICAL HERNIA REPAIR N/A 12/18/2014   Procedure: HERNIA REPAIR UMBILICAL ADULT;  Surgeon: Lattie Haw, MD;  Location: [facility] ORS;  Service: General;  Laterality: N/A;   VIDEO BRONCHOSCOPY WITH ENDOBRONCHIAL ULTRASOUND N/A 03/15/2023   Procedure: VIDEO BRONCHOSCOPY WITH ENDOBRONCHIAL ULTRASOUND;  Surgeon: Vida Rigger, MD;  Location: [facility] ORS;  Service: Thoracic;  Laterality: N/A;   Family History:  Family History  Problem Relation Age of  Onset   Asthma Mother    Hypertension Father    Family Psychiatric  History: denies knowledge Social History:  Social History   Substance and Sexual Activity  Alcohol Use Not Currently   Alcohol/week: 75.0 standard drinks of alcohol   Types: 75 Cans of beer per week     Social History   Substance and Sexual Activity  Drug  Use Not Currently   Types: Marijuana, "Crack" cocaine   Comment: none in 15 yrs    Social History   Socioeconomic History   Marital status: Widowed    Spouse name: Not on file   Number of children: Not on file   Years of education: Not on file   Highest education level: Not on file  Occupational History   Not on file  Tobacco Use   Smoking status: Every Day    Current packs/day: 0.15    Average packs/day: 0.2 packs/day for 45.0 years (6.8 ttl pk-yrs)    Types: Cigarettes   Smokeless tobacco: Never  Vaping Use   Vaping status: Never Used  Substance and Sexual Activity   Alcohol use: Not Currently    Alcohol/week: 75.0 standard drinks of alcohol    Types: 75 Cans of beer per week   Drug use: Not Currently    Types: Marijuana, "Crack" cocaine    Comment: none in 15 yrs   Sexual activity: Not Currently  Other Topics Concern   Not on file  Social History Narrative   Not on file   Social Determinants of Health   Financial Resource Strain: Not on file  Food Insecurity: Patient Declined (06/13/2023)   Hunger Vital Sign    Worried About Running Out of Food in the Last Year: Patient declined    Ran Out of Food in the Last Year: Patient declined  Transportation Needs: Patient Declined (06/13/2023)   PRAPARE - Administrator, Civil Service (Medical): Patient declined    Lack of Transportation (Non-Medical): Patient declined  Physical Activity: Not on file  Stress: Not on file  Social Connections: Not on file   Additional Social History:    Allergies:  No Known Allergies  Labs:  Results for orders placed or performed during the hospital encounter of 06/13/23 (from the past 48 hour(s))  Comprehensive metabolic panel     Status: Abnormal   Collection Time: 06/13/23 12:41 PM  Result Value Ref Range   Sodium 137 135 - 145 mmol/L   Potassium 4.0 3.5 - 5.1 mmol/L   Chloride 104 98 - 111 mmol/L   CO2 24 22 - 32 mmol/L   Glucose, Bld 102 (H) 70 - 99 mg/dL     Comment: Glucose reference range applies only to samples taken after fasting for at least 8 hours.   BUN 12 8 - 23 mg/dL   Creatinine, Ser 2.44 0.61 - 1.24 mg/dL   Calcium 9.1 8.9 - 01.0 mg/dL   Total Protein 6.9 6.5 - 8.1 g/dL   Albumin 3.7 3.5 - 5.0 g/dL   AST 19 15 - 41 U/L   ALT 26 0 - 44 U/L   Alkaline Phosphatase 60 38 - 126 U/L   Total Bilirubin 0.9 <1.2 mg/dL   GFR, Estimated >27 >25 mL/min    Comment: (NOTE) Calculated using the CKD-EPI Creatinine Equation (2021)    Anion gap 9 5 - 15    Comment: Performed at [facility], [address] [place], [place] [address]  Ethanol     Status:  None   Collection Time: 06/13/23 12:41 PM  Result Value Ref Range   Alcohol, Ethyl (B) <10 <10 mg/dL    Comment: (NOTE) Lowest detectable limit for serum alcohol is 10 mg/dL.  For medical purposes only. Performed at [facility], [address] [place], [place] [address]   Salicylate level     Status: Abnormal   Collection Time: 06/13/23 12:41 PM  Result Value Ref Range   Salicylate Lvl <7.0 (L) 7.0 - 30.0 mg/dL    Comment: Performed at [facility], [address] [place], [place] [address]  Acetaminophen level     Status: Abnormal   Collection Time: 06/13/23 12:41 PM  Result Value Ref Range   Acetaminophen (Tylenol), Serum <10 (L) 10 - 30 ug/mL    Comment: (NOTE) Therapeutic concentrations vary significantly. A range of 10-30 ug/mL  may be an effective concentration for many patients. However, some  are best treated at concentrations outside of this range. Acetaminophen concentrations >150 ug/mL at 4 hours after ingestion  and >50 ug/mL at 12 hours after ingestion are often associated with  toxic reactions.  Performed at [facility], [address] [place], [place] [address]   cbc     Status: Abnormal   Collection Time: 06/13/23 12:41 PM  Result Value Ref Range   WBC 6.2 4.0 - 10.5 K/uL   RBC 3.70 (L) 4.22 - 5.81 MIL/uL    Hemoglobin 11.6 (L) 13.0 - 17.0 g/dL   HCT 21.3 (L) 08.6 - 57.8 %   MCV 94.9 80.0 - 100.0 fL   MCH 31.4 26.0 - 34.0 pg   MCHC 33.0 30.0 - 36.0 g/dL   RDW 46.9 (H) 62.9 - 52.8 %   Platelets 157 150 - 400 K/uL   nRBC 0.0 0.0 - 0.2 %    Comment: Performed at [facility], [address], [place], [place] [address]  Urine Drug Screen, Qualitative     Status: None   Collection Time: 06/13/23 12:41 PM  Result Value Ref Range   Tricyclic, Ur Screen NONE DETECTED NONE DETECTED   Amphetamines, Ur Screen NONE DETECTED NONE DETECTED   MDMA (Ecstasy)Ur Screen NONE DETECTED NONE DETECTED   Cocaine Metabolite,Ur [place] NONE DETECTED NONE DETECTED   Opiate, Ur Screen NONE DETECTED NONE DETECTED   Phencyclidine (PCP) Ur S NONE DETECTED NONE DETECTED   Cannabinoid 50 Ng, Ur  NONE DETECTED NONE DETECTED   Barbiturates, Ur Screen NONE DETECTED NONE DETECTED   Benzodiazepine, Ur Scrn NONE DETECTED NONE DETECTED   Methadone Scn, Ur NONE DETECTED NONE DETECTED    Comment: (NOTE) Tricyclics + metabolites, urine    Cutoff 1000 ng/mL Amphetamines + metabolites, urine  Cutoff 1000 ng/mL MDMA (Ecstasy), urine              Cutoff 500 ng/mL Cocaine Metabolite, urine          Cutoff 300 ng/mL Opiate + metabolites, urine        Cutoff 300 ng/mL Phencyclidine (PCP), urine         Cutoff 25 ng/mL Cannabinoid, urine                 Cutoff 50 ng/mL Barbiturates + metabolites, urine  Cutoff 200 ng/mL Benzodiazepine, urine              Cutoff 200 ng/mL Methadone, urine                   Cutoff 300 ng/mL  The  urine drug screen provides only a preliminary, unconfirmed analytical test result and should not be used for non-medical purposes. Clinical consideration and professional judgment should be applied to any positive drug screen result due to possible interfering substances. A more specific alternate chemical method must be used in order to obtain a confirmed analytical result. Gas chromatography / mass  spectrometry (GC/MS) is the preferred confirm atory method. Performed at [facility], [address] [place], [place] [address]   Blood Culture (routine x 2)     Status: None (Preliminary result)   Collection Time: 06/13/23  1:19 PM   Specimen: BLOOD  Result Value Ref Range   Specimen Description BLOOD BLOOD RIGHT ARM    Special Requests      BOTTLES DRAWN AEROBIC AND ANAEROBIC Blood Culture adequate volume   Culture      NO GROWTH < 24 HOURS Performed at [facility], [address], [place], [place] [address]    Report Status PENDING   Blood Culture (routine x 2)     Status: None (Preliminary result)   Collection Time: 06/13/23  1:21 PM   Specimen: BLOOD  Result Value Ref Range   Specimen Description BLOOD RIGHT ANTECUBITAL    Special Requests      BOTTLES DRAWN AEROBIC AND ANAEROBIC Blood Culture adequate volume   Culture      NO GROWTH < 24 HOURS Performed at [facility], [address], [place], [place] [address]    Report Status PENDING   Resp panel by RT-PCR (RSV, Flu A&B, Covid) Anterior Nasal Swab     Status: None   Collection Time: 06/13/23  1:23 PM   Specimen: Anterior Nasal Swab  Result Value Ref Range   SARS Coronavirus 2 by RT PCR NEGATIVE NEGATIVE    Comment: (NOTE) SARS-CoV-2 target nucleic acids are NOT DETECTED.  The SARS-CoV-2 RNA is generally detectable in upper respiratory specimens during the acute phase of infection. The lowest concentration of SARS-CoV-2 viral copies this assay can detect is 138 copies/mL. A negative result does not preclude SARS-Cov-2 infection and should not be used as the sole basis for treatment or other patient management decisions. A negative result may occur with  improper specimen collection/handling, submission of specimen other than nasopharyngeal swab, presence of viral mutation(s) within the areas targeted by this assay, and inadequate number of viral copies(<138 copies/mL). A negative  result must be combined with clinical observations, patient history, and epidemiological information. The expected result is Negative.  Fact Sheet for Patients:  BloggerCourse.com  Fact Sheet for Healthcare Providers:  SeriousBroker.it  This test is no t yet approved or cleared by the [place] FDA and  has been authorized for detection and/or diagnosis of SARS-CoV-2 by FDA under an Emergency Use Authorization (EUA). This EUA will remain  in effect (meaning this test can be used) for the duration of the COVID-19 declaration under Section 564(b)(1) of the Act, 21 U.S.C.section 360bbb-3(b)(1), unless the authorization is terminated  or revoked sooner.       Influenza A by PCR NEGATIVE NEGATIVE   Influenza B by PCR NEGATIVE NEGATIVE    Comment: (NOTE) The Xpert Xpress SARS-CoV-2/FLU/RSV plus assay is intended as an aid in the diagnosis of influenza from Nasopharyngeal swab specimens and should not be used as a sole basis for treatment. Nasal washings and aspirates are unacceptable for Xpert Xpress SARS-CoV-2/FLU/RSV testing.  Fact Sheet for Patients: BloggerCourse.com  Fact Sheet for Healthcare Providers: SeriousBroker.it  This test is  not yet approved or cleared by the [place] and has been authorized for detection and/or diagnosis of SARS-CoV-2 by FDA under an Emergency Use Authorization (EUA). This EUA will remain in effect (meaning this test can be used) for the duration of the COVID-19 declaration under Section 564(b)(1) of the Act, 21 U.S.C. section 360bbb-3(b)(1), unless the authorization is terminated or revoked.     Resp Syncytial Virus by PCR NEGATIVE NEGATIVE    Comment: (NOTE) Fact Sheet for Patients: BloggerCourse.com  Fact Sheet for Healthcare Providers: SeriousBroker.it  This test is not yet  approved or cleared by the [place] FDA and has been authorized for detection and/or diagnosis of SARS-CoV-2 by FDA under an Emergency Use Authorization (EUA). This EUA will remain in effect (meaning this test can be used) for the duration of the COVID-19 declaration under Section 564(b)(1) of the Act, 21 U.S.C. section 360bbb-3(b)(1), unless the authorization is terminated or revoked.  Performed at [facility], [address] [place], [place] [address]   Lactic acid, plasma     Status: Abnormal   Collection Time: 06/13/23  1:23 PM  Result Value Ref Range   Lactic Acid, Venous 2.3 (HH) 0.5 - 1.9 mmol/L    Comment: CRITICAL RESULT CALLED TO, READ BACK BY AND VERIFIED WITH NATALIE REIFFER 06/13/23 1410 MU Performed at [facility], [address] [place], [place] [address]   Blood gas, venous     Status: Abnormal   Collection Time: 06/13/23  1:23 PM  Result Value Ref Range   pH, Ven 7.41 7.25 - 7.43   pCO2, Ven 41 (L) 44 - 60 mmHg   pO2, Ven 48 (H) 32 - 45 mmHg   Bicarbonate 26.0 20.0 - 28.0 mmol/L   Acid-Base Excess 1.2 0.0 - 2.0 mmol/L   O2 Saturation 81.8 %   Patient temperature 37.0    Collection site VENOUS     Comment: Performed at [facility], [address] [place], [place] [address]  Lactic acid, plasma     Status: None   Collection Time: 06/13/23  5:50 PM  Result Value Ref Range   Lactic Acid, Venous 1.5 0.5 - 1.9 mmol/L    Comment: Performed at [facility], [address] [place], [place] [address]  HIV Antibody (routine testing w rflx)     Status: None   Collection Time: 06/13/23  8:33 PM  Result Value Ref Range   HIV Screen 4th Generation wRfx Non Reactive Non Reactive    Comment: Performed at [facility], [address], [place], [place] [address]    Current Facility-Administered Medications  Medication Dose Route Frequency Provider Last Rate Last Admin   albuterol (PROVENTIL) (2.5 MG/3ML) 0.083%  nebulizer solution 3 mL  3 mL Nebulization Q4H PRN Mikey College T, MD   3 mL at 06/14/23 0207   aspirin EC tablet 81 mg  81 mg Oral Daily Mikey College T, MD       clopidogrel (PLAVIX) tablet 75 mg  75 mg Oral Daily Mikey College T, MD       diltiazem (CARDIZEM CD) 24 hr capsule 240 mg  240 mg Oral Daily Mikey College T, MD       docusate sodium (COLACE) capsule 100 mg  100 mg Oral BID PRN Mikey College T, MD       doxycycline (VIBRA-TABS) tablet 100 mg  100 mg Oral Q12H Mikey College T, MD   100 mg at 06/13/23 2102  enoxaparin (LOVENOX) injection 50 mg  50 mg Subcutaneous Q24H Mikey College T, MD       famotidine (PEPCID) tablet 20 mg  20 mg Oral Daily Zhang, Ping T, MD       fluticasone furoate-vilanterol (BREO ELLIPTA) 100-25 MCG/ACT 1 puff  1 puff Inhalation Daily Mikey College T, MD       And   umeclidinium bromide (INCRUSE ELLIPTA) 62.5 MCG/ACT 1 puff  1 puff Inhalation Daily Zhang, [name] T, MD       guaiFENesin-dextromethorphan (ROBITUSSIN DM) 100-10 MG/5ML syrup 10 mL  10 mL Oral Q6H PRN Mikey College T, MD   10 mL at 06/14/23 0204   ipratropium-albuterol (DUONEB) 0.5-2.5 (3) MG/3ML nebulizer solution 3 mL  3 mL Nebulization TID Mikey College T, MD   3 mL at 06/14/23 0800   mirtazapine (REMERON) tablet 30 mg  30 mg Oral QHS Mikey College T, MD   30 mg at 06/13/23 2101   montelukast (SINGULAIR) tablet 10 mg  10 mg Oral QHS Mikey College T, MD   10 mg at 06/13/23 2059   Oral care mouth rinse  15 mL Mouth Rinse PRN Mikey College T, MD       paliperidone (INVEGA) 24 hr tablet 9 mg  9 mg Oral QHS Mikey College T, MD   9 mg at 06/13/23 2351   pravastatin (PRAVACHOL) tablet 20 mg  20 mg Oral q1800 Mikey College T, MD   20 mg at 06/13/23 1758   predniSONE (DELTASONE) tablet 40 mg  40 mg Oral Q breakfast Mikey College T, MD       prochlorperazine (COMPAZINE) tablet 10 mg  10 mg Oral Q6H PRN Mikey College T, MD       traZODone (DESYREL) tablet 150 mg  150 mg Oral QHS PRN Emeline General, MD   150 mg at 06/13/23 2131    Musculoskeletal: Strength & Muscle Tone:  lying down on assessment Gait & Station:  lying down on assessment Patient leans:  lying down on assessment  Psychiatric Specialty Exam:  Presentation  General Appearance:  Appropriate for Environment  Eye Contact: Fair  Speech: Clear and Coherent  Speech Volume: Decreased  Handedness: Right   Mood and Affect  Mood: Anxious; Depressed  Affect: Constricted   Thought Process  Thought Processes: Coherent; Goal Directed; Linear  Descriptions of Associations:Intact  Orientation:Full (Time, Place and Person)  Thought Content:Logical  History of Schizophrenia/Schizoaffective disorder:Yes  Duration of Psychotic Symptoms:Greater than six months  Hallucinations:Hallucinations: Auditory Description of Command Hallucinations: "kill myself" Description of Auditory Hallucinations: "kill myself"  Ideas of Reference:Paranoia; Other (comment) ("just anxious")  Suicidal Thoughts:Suicidal Thoughts: Yes, Active  Homicidal Thoughts:Homicidal Thoughts: No  Sensorium  Memory: Immediate Fair  Judgment: Intact  Insight: Present   Executive Functions  Concentration: Fair  Attention Span: Fair  Recall: [employer] of Knowledge: Fair  Language: Fair   Psychomotor Activity  Psychomotor Activity: Psychomotor Activity: Normal   Assets  Assets: Communication Skills; Desire for Improvement; Housing   Sleep  Sleep: Sleep: Poor   Physical Exam: Physical Exam Constitutional:      General: He is not in acute distress.    Appearance: He is not ill-appearing, toxic-appearing or diaphoretic.  Eyes:     General: No scleral icterus. Cardiovascular:     Rate and Rhythm: Normal rate.  Pulmonary:     Effort: Pulmonary effort is normal. No respiratory distress.  Neurological:     Mental Status: He is alert and oriented  to person, place, and time.  Psychiatric:        Attention and Perception: Attention  normal. He perceives auditory hallucinations.        Mood and Affect: Mood is anxious and depressed.        Speech: Speech normal.        Behavior: Behavior normal. Behavior is cooperative.        Thought Content: Thought content is paranoid. Thought content includes suicidal ideation. Thought content does not include homicidal ideation.        Cognition and Memory: Cognition and memory normal.        Judgment: Judgment normal.    Review of Systems  Constitutional:  Negative for chills and fever.  Respiratory:  Negative for shortness of breath.   Cardiovascular:  Negative for chest pain and palpitations.  Gastrointestinal:  Negative for abdominal pain.  Neurological:  Negative for headaches.  Psychiatric/Behavioral:  Positive for depression, hallucinations and suicidal ideas. The patient is nervous/anxious and has insomnia.    Blood pressure 132/80, pulse 88, temperature 97.7 F (36.5 C), temperature source Oral, resp. rate 17, height 6\' 1"  (1.854 m), weight 101.8 kg, SpO2 96%. Body mass index is 29.61 kg/m.  Treatment Plan Summary: Daily contact with patient to assess and evaluate symptoms and progress in treatment, Medication management, and Plan    62 y.o. male patient w/ history of adenocarcinoma of left lung, copd, schizophrenia, admitted to [facility] on 06/13/23, subsequently admitted to the medical floor for COPD with acute exacerbation. On assessment today, continues to endorse depression, anxiety, command auditory hallucinations, suicidal ideations, and poor sleep. Will increase invega from 9mg  to 12mg  to address hallucinations and increase trazodone from 150mg  to 200mg  to address mood and sleep. Continue recommendation for inpatient psychiatric hospitalization once pt is medically cleared. Have reached out to primary team MD about pt status on medical clearance.    Disposition: Recommend psychiatric Inpatient admission when medically cleared. Supportive therapy provided about ongoing  stressors.  Lauree Chandler, NP 06/14/2023 9:19 AM

## 2023-06-14 NOTE — Progress Notes (Signed)
Progress Note   Patient: [name] WUJ:[id number] DOB: 28-May-1961 DOA: 06/13/2023     0 DOS: the patient was seen and examined on 06/14/2023   Brief hospital course:  Peyten Weare Krauser is a 62 y.o. male with medical history significant of COPD Gold stage II in 2015, HTN, HLD, anxiety/depression, left upper lung mass presented with multiple complaints including persistent insomnia, cough, wheezing.   Symptoms started 3 days ago.  Patient started to have cough wheezing shortness of breath.  He also complaining about visual hallucination and difficulty to sleep.  Denies any suicidal ideation.  No fever or chills or chest pains. ED Course: Borderline tachycardia, nonhypoxic, chest x-ray showed no acute infiltrates.  VBG showed 7.4 1/41/48, WBC 6.2, hemoglobin 10.6, creatinine 0.6.  Patient was given IV Solu-Medrol, breathing treatment IV antibiotics ceftriaxone and azithromycin in the ED.  Assessment and Plan:  Acute COPD exacerbation -Change antibiotics to doxycycline as patient was recently treated with Rocephin and azithromycin 2 weeks ago. -Incentive spirometry Continue prednisone Continue ICS, LABA and DuoNebs every 6 hours and as needed albuterol   Insomnia History of schizophrenia -Discussed with on-call psychiatry, patient is involuntarily committed -Likely can go to inpatient psychiatry tomorrow if COPD symptoms stabilized. 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 -Outpatient follow-up with oncology   History of schizophrenia Continue management as outlined by psychiatry team Plan of care discussed with psychiatry  HTN -Continue Cardizem   DVT prophylaxis: Lovenox Code Status: Full code Family Communication: None at bedside Disposition Plan: Less than 2 midnight hospital stay Consults called: Psychiatry Admission status: Telemetry observation      Subjective:  Patient seen and examined at bedside this morning Respiratory function improving Denies nausea vomiting chest pain At discharge patient will be placed in inpatient psych ward  Physical Exam: General: Appears depressed Neck: normal, supple, no masses, no thyromegaly Respiratory: Some wheezing noted bilaterally.  Cardiovascular: Regular rate and rhythm, no murmurs / rubs / gallops. No extremity edema. 2+ pedal pulses. No carotid bruits.  Abdomen: no tenderness, no masses palpated. No hepatosplenomegaly. Bowel sounds positive.  Musculoskeletal: no clubbing / cyanosis. No joint deformity upper and lower extremities. Good ROM, no contractures. Normal muscle tone.  Skin: no rashes, lesions, ulcers. No induration Neurologic: CN 2-12 grossly intact. Sensation intact, DTR normal.    Vitals:   06/14/23 0757 06/14/23 0802 06/14/23 1310 06/14/23 1420  BP: 132/80  139/75   Pulse: 88  (!) 103   Resp: 17  17   Temp: 97.7 F (36.5 C)  97.7 F (36.5 C)   TempSrc: Oral     SpO2: 95% 96% 97% 97%  Weight:      Height:        Data Reviewed:    Latest Ref Rng & Units 06/13/2023   12:41 PM 06/08/2023    8:50 AM 06/02/2023    4:41 AM  BMP  Glucose 70 - 99 mg/dL 956  213  90   BUN 8 - 23 mg/dL 12  12  14    Creatinine 0.61 - 1.24 mg/dL 0.86  5.78  4.69   Sodium 135 - 145 mmol/L 137  140  137   Potassium 3.5 - 5.1 mmol/L 4.0  3.3  3.6   Chloride 98 - 111 mmol/L 104  105  102   CO2 22 - 32 mmol/L 24  25  29    Calcium  8.9 - 10.3 mg/dL 9.1  9.2  8.8        Latest Ref Rng & Units 06/13/2023   12:41 PM 06/08/2023    8:50 AM 06/02/2023    4:41 AM  CBC  WBC 4.0 - 10.5 K/uL 6.2  10.9  6.2   Hemoglobin 13.0 - 17.0 g/dL 25.3  66.4  40.3   Hematocrit 39.0 - 52.0 % 35.1  35.1  30.0   Platelets 150 - 400 K/uL 157  161  159      Time spent: 55 minutes  Author: Loyce Dys, MD 06/14/2023 4:28 PM  For on call review [email or web address].

## 2023-06-15 ENCOUNTER — Ambulatory Visit: Payer: 59

## 2023-06-15 DIAGNOSIS — F209 Schizophrenia, unspecified: Secondary | ICD-10-CM | POA: Diagnosis not present

## 2023-06-15 DIAGNOSIS — R45851 Suicidal ideations: Secondary | ICD-10-CM | POA: Diagnosis not present

## 2023-06-15 DIAGNOSIS — Z8659 Personal history of other mental and behavioral disorders: Secondary | ICD-10-CM | POA: Diagnosis not present

## 2023-06-15 DIAGNOSIS — J441 Chronic obstructive pulmonary disease with (acute) exacerbation: Secondary | ICD-10-CM | POA: Diagnosis not present

## 2023-06-15 LAB — BASIC METABOLIC PANEL
Anion gap: 7 (ref 5–15)
BUN: 13 mg/dL (ref 8–23)
CO2: 26 mmol/L (ref 22–32)
Calcium: 8.9 mg/dL (ref 8.9–10.3)
Chloride: 105 mmol/L (ref 98–111)
Creatinine, Ser: 0.6 mg/dL — ABNORMAL LOW (ref 0.61–1.24)
GFR, Estimated: >60 mL/min (ref >60)
Glucose, Bld: 103 mg/dL — ABNORMAL HIGH (ref 70–99)
Potassium: 4 mmol/L (ref 3.5–5.1)
Sodium: 138 mmol/L (ref 135–145)

## 2023-06-15 LAB — CBC WITH DIFFERENTIAL/PLATELET
Abs Immature Granulocytes: 0.06 10*3/uL (ref 0.00–0.07)
Basophils Absolute: 0 10*3/uL (ref 0.0–0.1)
Basophils Relative: 0 %
Eosinophils Absolute: 0 10*3/uL (ref 0.0–0.5)
Eosinophils Relative: 0 %
HCT: 27.6 % — ABNORMAL LOW (ref 39.0–52.0)
Hemoglobin: 9.5 g/dL — ABNORMAL LOW (ref 13.0–17.0)
Immature Granulocytes: 1 %
Lymphocytes Relative: 12 %
Lymphs Abs: 0.8 10*3/uL (ref 0.7–4.0)
MCH: 31.8 pg (ref 26.0–34.0)
MCHC: 34.4 g/dL (ref 30.0–36.0)
MCV: 92.3 fL (ref 80.0–100.0)
Monocytes Absolute: 0.3 10*3/uL (ref 0.1–1.0)
Monocytes Relative: 5 %
Neutro Abs: 5.2 10*3/uL (ref 1.7–7.7)
Neutrophils Relative %: 82 %
Platelets: 153 10*3/uL (ref 150–400)
RBC: 2.99 MIL/uL — ABNORMAL LOW (ref 4.22–5.81)
RDW: 17 % — ABNORMAL HIGH (ref 11.5–15.5)
WBC: 6.4 10*3/uL (ref 4.0–10.5)
nRBC: 0 % (ref 0.0–0.2)

## 2023-06-15 MED ORDER — IPRATROPIUM-ALBUTEROL 0.5-2.5 (3) MG/3ML IN SOLN
3.0000 mL | Freq: Two times a day (BID) | RESPIRATORY_TRACT | Status: DC
  Administered 2023-06-15 – 2023-06-16 (×2): 3 mL via RESPIRATORY_TRACT
  Filled 2023-06-15 (×2): qty 3

## 2023-06-15 NOTE — Consult Note (Signed)
Patient noted lying in the bed with 1:1 at bedside. He is alert and willing to engage. Reagan has a past psychiatric history of schizophrenia. Patient continues to report command auditory hallucinations. Recent medication adjustments noted. He reports medication compliance and denies adverse medication effects. He denies SI/HI/VH. Patient was previously recommended for inpatient psychiatry by an alternate provider. Awaiting medical clearance prior to transitioning to psych inpatient status.

## 2023-06-15 NOTE — Progress Notes (Signed)
Progress Note   Patient: [name] BJY:[id number] DOB: [birth year] DOA: 06/13/2023     1 DOS: the patient was seen and examined on 06/15/2023     Brief hospital course:  Riku Buttery Gottwald is a 62 y.o. male with medical history significant of COPD Gold stage II in 2015, HTN, HLD, anxiety/depression, left upper lung mass presented with multiple complaints including persistent insomnia, cough, wheezing.   Symptoms started 3 days ago.  Patient started to have cough wheezing shortness of breath.  He also complaining about visual hallucination and difficulty to sleep.  Denies any suicidal ideation.  No fever or chills or chest pains. ED Course: Borderline tachycardia, nonhypoxic, chest x-ray showed no acute infiltrates.  VBG showed 7.4 1/41/48, WBC 6.2, hemoglobin 10.6, creatinine 0.6.  Patient was given IV Solu-Medrol, breathing treatment IV antibiotics ceftriaxone and azithromycin in the ED.   Assessment and Plan:   Acute COPD exacerbation -Change antibiotics to doxycycline as patient was recently treated with Rocephin and azithromycin 2 weeks ago. -Incentive spirometry Continue prednisone Continue ICS, LABA and DuoNebs every 6 hours and as needed albuterol   Insomnia History of schizophrenia -Discussed with on-call psychiatry, patient is involuntarily committed -Likely can go to inpatient psychiatry tomorrow if COPD symptoms stabilized. 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 in October 2024 -Outpatient follow-up with oncology   History of schizophrenia Continue management as outlined by psychiatry team Psychiatry team on board and case discussed   HTN Continue Cardizem   DVT prophylaxis: Lovenox Code Status: Full code Family Communication: None at bedside Disposition Plan: Inpatient psych Consults called: Psychiatry   Subjective:  Patient seen and examined  at bedside this morning According to seasonal as respiratory function improved however when he started talking on the phone he started struggling to breathe We will continue respiratory medications for today Hopefully patient may be ready for psych placement tomorrow   Physical Exam: General: Appears depressed Neck: normal, supple, no masses, no thyromegaly Respiratory: Some wheezing noted bilaterally.  Cardiovascular: Regular rate and rhythm, no murmurs / rubs / gallops. No extremity edema. 2+ pedal pulses. No carotid bruits.  Abdomen: no tenderness, no masses palpated. No hepatosplenomegaly. Bowel sounds positive.  Musculoskeletal: no clubbing / cyanosis. No joint deformity upper and lower extremities. Good ROM, no contractures. Normal muscle tone.  Skin: no rashes, lesions, ulcers. No induration Neurologic: CN 2-12 grossly intact. Sensation intact, DTR normal.     Total time spent 40 minutes  Data reviewed    Latest Ref Rng & Units 06/15/2023    4:22 AM 06/13/2023   12:41 PM 06/08/2023    8:50 AM  CBC  WBC 4.0 - 10.5 K/uL 6.4  6.2  10.9   Hemoglobin 13.0 - 17.0 g/dL 9.5  08.6  57.8   Hematocrit 39.0 - 52.0 % 27.6  35.1  35.1   Platelets 150 - 400 K/uL 153  157  161        Latest Ref Rng & Units 06/15/2023    4:22 AM 06/13/2023   12:41 PM 06/08/2023    8:50 AM  BMP  Glucose 70 - 99 mg/dL 469  629  528   BUN 8 - 23 mg/dL 13  12  12    Creatinine 0.61 - 1.24 mg/dL 4.13  2.44  0.10   Sodium 135 - 145 mmol/L 138  137  140   Potassium 3.5 - 5.1 mmol/L  4.0  4.0  3.3   Chloride 98 - 111 mmol/L 105  104  105   CO2 22 - 32 mmol/L 26  24  25    Calcium 8.9 - 10.3 mg/dL 8.9  9.1  9.2     Vitals:   06/14/23 2006 06/14/23 2015 06/15/23 0421 06/15/23 0818  BP:   122/78 119/69  Pulse: 93  84 87  Resp:   18 17  Temp: 98.4 F (36.9 C)  98 F (36.7 C) 98.1 F (36.7 C)  TempSrc: Oral  Oral Oral  SpO2: 97% 95% 96% 96%  Weight:      Height:         Author: Loyce Dys,  MD 06/15/2023 4:12 PM  For on call review [email or web address].

## 2023-06-16 ENCOUNTER — Ambulatory Visit: Payer: 59

## 2023-06-16 ENCOUNTER — Encounter: Payer: Self-pay | Admitting: Psychiatry

## 2023-06-16 ENCOUNTER — Other Ambulatory Visit: Payer: Self-pay

## 2023-06-16 ENCOUNTER — Encounter: Payer: Self-pay | Admitting: Oncology

## 2023-06-16 ENCOUNTER — Inpatient Hospital Stay
Admission: AD | Admit: 2023-06-16 | Discharge: 2023-07-04 | DRG: 885 | Disposition: A | Discharge status: Home / Self Care | Payer: 59 | Source: Intra-hospital | Attending: Psychiatry | Admitting: Psychiatry

## 2023-06-16 DIAGNOSIS — C3412 Malignant neoplasm of upper lobe, left bronchus or lung: Secondary | ICD-10-CM | POA: Diagnosis present

## 2023-06-16 DIAGNOSIS — D689 Coagulation defect, unspecified: Secondary | ICD-10-CM | POA: Diagnosis present

## 2023-06-16 DIAGNOSIS — Z79899 Other long term (current) drug therapy: Secondary | ICD-10-CM | POA: Diagnosis not present

## 2023-06-16 DIAGNOSIS — Z825 Family history of asthma and other chronic lower respiratory diseases: Secondary | ICD-10-CM | POA: Diagnosis not present

## 2023-06-16 DIAGNOSIS — F203 Undifferentiated schizophrenia: Secondary | ICD-10-CM | POA: Diagnosis present

## 2023-06-16 DIAGNOSIS — F419 Anxiety disorder, unspecified: Secondary | ICD-10-CM | POA: Diagnosis present

## 2023-06-16 DIAGNOSIS — F17210 Nicotine dependence, cigarettes, uncomplicated: Secondary | ICD-10-CM | POA: Diagnosis present

## 2023-06-16 DIAGNOSIS — J45901 Unspecified asthma with (acute) exacerbation: Secondary | ICD-10-CM | POA: Diagnosis present

## 2023-06-16 DIAGNOSIS — F209 Schizophrenia, unspecified: Principal | ICD-10-CM | POA: Diagnosis present

## 2023-06-16 DIAGNOSIS — Z9151 Personal history of suicidal behavior: Secondary | ICD-10-CM

## 2023-06-16 DIAGNOSIS — Z8719 Personal history of other diseases of the digestive system: Secondary | ICD-10-CM | POA: Diagnosis not present

## 2023-06-16 DIAGNOSIS — F32A Depression, unspecified: Secondary | ICD-10-CM | POA: Diagnosis present

## 2023-06-16 DIAGNOSIS — K5900 Constipation, unspecified: Secondary | ICD-10-CM | POA: Diagnosis not present

## 2023-06-16 DIAGNOSIS — Z7982 Long term (current) use of aspirin: Secondary | ICD-10-CM | POA: Diagnosis not present

## 2023-06-16 DIAGNOSIS — Z8673 Personal history of transient ischemic attack (TIA), and cerebral infarction without residual deficits: Secondary | ICD-10-CM

## 2023-06-16 DIAGNOSIS — E785 Hyperlipidemia, unspecified: Secondary | ICD-10-CM | POA: Diagnosis present

## 2023-06-16 DIAGNOSIS — Z8659 Personal history of other mental and behavioral disorders: Secondary | ICD-10-CM | POA: Diagnosis not present

## 2023-06-16 DIAGNOSIS — R45850 Homicidal ideations: Secondary | ICD-10-CM | POA: Diagnosis present

## 2023-06-16 DIAGNOSIS — G4709 Other insomnia: Secondary | ICD-10-CM | POA: Diagnosis present

## 2023-06-16 DIAGNOSIS — R45851 Suicidal ideations: Secondary | ICD-10-CM | POA: Diagnosis present

## 2023-06-16 DIAGNOSIS — J441 Chronic obstructive pulmonary disease with (acute) exacerbation: Secondary | ICD-10-CM | POA: Diagnosis present

## 2023-06-16 DIAGNOSIS — Z8249 Family history of ischemic heart disease and other diseases of the circulatory system: Secondary | ICD-10-CM | POA: Diagnosis not present

## 2023-06-16 DIAGNOSIS — Z8701 Personal history of pneumonia (recurrent): Secondary | ICD-10-CM | POA: Diagnosis not present

## 2023-06-16 DIAGNOSIS — Z9889 Other specified postprocedural states: Secondary | ICD-10-CM | POA: Diagnosis not present

## 2023-06-16 DIAGNOSIS — I10 Essential (primary) hypertension: Secondary | ICD-10-CM | POA: Diagnosis present

## 2023-06-16 DIAGNOSIS — Z7902 Long term (current) use of antithrombotics/antiplatelets: Secondary | ICD-10-CM

## 2023-06-16 DIAGNOSIS — Z7951 Long term (current) use of inhaled steroids: Secondary | ICD-10-CM

## 2023-06-16 DIAGNOSIS — Z716 Tobacco abuse counseling: Secondary | ICD-10-CM

## 2023-06-16 LAB — CBC WITH DIFFERENTIAL/PLATELET
Abs Immature Granulocytes: 0.04 10*3/uL (ref 0.00–0.07)
Basophils Absolute: 0 10*3/uL (ref 0.0–0.1)
Basophils Relative: 0 %
Eosinophils Absolute: 0 10*3/uL (ref 0.0–0.5)
Eosinophils Relative: 0 %
HCT: 28.9 % — ABNORMAL LOW (ref 39.0–52.0)
Hemoglobin: 9.5 g/dL — ABNORMAL LOW (ref 13.0–17.0)
Immature Granulocytes: 1 %
Lymphocytes Relative: 19 %
Lymphs Abs: 1 10*3/uL (ref 0.7–4.0)
MCH: 31.4 pg (ref 26.0–34.0)
MCHC: 32.9 g/dL (ref 30.0–36.0)
MCV: 95.4 fL (ref 80.0–100.0)
Monocytes Absolute: 0.4 10*3/uL (ref 0.1–1.0)
Monocytes Relative: 8 %
Neutro Abs: 3.8 10*3/uL (ref 1.7–7.7)
Neutrophils Relative %: 72 %
Platelets: 154 10*3/uL (ref 150–400)
RBC: 3.03 MIL/uL — ABNORMAL LOW (ref 4.22–5.81)
RDW: 17.3 % — ABNORMAL HIGH (ref 11.5–15.5)
WBC: 5.3 10*3/uL (ref 4.0–10.5)
nRBC: 0 % (ref 0.0–0.2)

## 2023-06-16 LAB — BASIC METABOLIC PANEL
Anion gap: 9 (ref 5–15)
BUN: 14 mg/dL (ref 8–23)
CO2: 25 mmol/L (ref 22–32)
Calcium: 9 mg/dL (ref 8.9–10.3)
Chloride: 103 mmol/L (ref 98–111)
Creatinine, Ser: 0.56 mg/dL — ABNORMAL LOW (ref 0.61–1.24)
GFR, Estimated: >60 mL/min (ref >60)
Glucose, Bld: 100 mg/dL — ABNORMAL HIGH (ref 70–99)
Potassium: 3.8 mmol/L (ref 3.5–5.1)
Sodium: 137 mmol/L (ref 135–145)

## 2023-06-16 MED ORDER — DOXYCYCLINE HYCLATE 100 MG PO TABS
100.0000 mg | ORAL_TABLET | Freq: Two times a day (BID) | ORAL | Status: DC
  Administered 2023-06-16 – 2023-06-21 (×10): 100 mg via ORAL
  Filled 2023-06-16 (×10): qty 1

## 2023-06-16 MED ORDER — DILTIAZEM HCL ER COATED BEADS 240 MG PO CP24
240.0000 mg | ORAL_CAPSULE | Freq: Every day | ORAL | Status: DC
  Administered 2023-06-18 – 2023-07-04 (×16): 240 mg via ORAL
  Filled 2023-06-16 (×18): qty 1

## 2023-06-16 MED ORDER — PALIPERIDONE ER 3 MG PO TB24
12.0000 mg | ORAL_TABLET | Freq: Every day | ORAL | Status: DC
  Administered 2023-06-16 – 2023-06-19 (×4): 12 mg via ORAL
  Filled 2023-06-16 (×4): qty 4

## 2023-06-16 MED ORDER — ALUM & MAG HYDROXIDE-SIMETH 200-200-20 MG/5ML PO SUSP: 30.0000 mL | ORAL | Status: DC | PRN

## 2023-06-16 MED ORDER — LORAZEPAM 2 MG/ML IJ SOLN: 2.0000 mg | Freq: Three times a day (TID) | INTRAMUSCULAR | Status: DC | PRN

## 2023-06-16 MED ORDER — TRAZODONE HCL 50 MG PO TABS
150.0000 mg | ORAL_TABLET | Freq: Every evening | ORAL | Status: DC | PRN
  Administered 2023-06-16: 150 mg via ORAL
  Filled 2023-06-16: qty 1

## 2023-06-16 MED ORDER — MIRTAZAPINE 15 MG PO TABS
30.0000 mg | ORAL_TABLET | Freq: Every day | ORAL | Status: DC
  Administered 2023-06-16: 30 mg via ORAL
  Filled 2023-06-16: qty 2

## 2023-06-16 MED ORDER — UMECLIDINIUM BROMIDE 62.5 MCG/ACT IN AEPB
1.0000 | INHALATION_SPRAY | Freq: Every day | RESPIRATORY_TRACT | Status: DC
  Administered 2023-06-17 – 2023-07-04 (×18): 1 via RESPIRATORY_TRACT
  Filled 2023-06-16 (×3): qty 7

## 2023-06-16 MED ORDER — DIPHENHYDRAMINE HCL 25 MG PO CAPS: 50.0000 mg | ORAL_CAPSULE | Freq: Three times a day (TID) | ORAL | Status: DC | PRN

## 2023-06-16 MED ORDER — FLUTICASONE FUROATE-VILANTEROL 100-25 MCG/ACT IN AEPB
1.0000 | INHALATION_SPRAY | Freq: Every day | RESPIRATORY_TRACT | Status: DC
Start: 2023-06-17 — End: 2023-06-20
  Administered 2023-06-17 – 2023-06-20 (×4): 1 via RESPIRATORY_TRACT
  Filled 2023-06-16: qty 28

## 2023-06-16 MED ORDER — DIPHENHYDRAMINE HCL 50 MG/ML IJ SOLN: 50.0000 mg | Freq: Three times a day (TID) | INTRAMUSCULAR | Status: DC | PRN

## 2023-06-16 MED ORDER — MAGNESIUM HYDROXIDE 400 MG/5ML PO SUSP
30.0000 mL | Freq: Every day | ORAL | Status: DC | PRN
  Administered 2023-06-26 – 2023-06-28 (×3): 30 mL via ORAL
  Filled 2023-06-16 (×3): qty 30

## 2023-06-16 MED ORDER — HALOPERIDOL 5 MG PO TABS: 5.0000 mg | ORAL_TABLET | Freq: Three times a day (TID) | ORAL | Status: DC | PRN

## 2023-06-16 MED ORDER — ACETAMINOPHEN 325 MG PO TABS: 650.0000 mg | ORAL_TABLET | Freq: Four times a day (QID) | ORAL | Status: DC | PRN

## 2023-06-16 MED ORDER — MONTELUKAST SODIUM 10 MG PO TABS
10.0000 mg | ORAL_TABLET | Freq: Every day | ORAL | Status: DC
  Administered 2023-06-16 – 2023-07-03 (×18): 10 mg via ORAL
  Filled 2023-06-16 (×20): qty 1

## 2023-06-16 MED ORDER — PRAVASTATIN SODIUM 20 MG PO TABS
20.0000 mg | ORAL_TABLET | Freq: Every day | ORAL | Status: DC
  Administered 2023-06-16 – 2023-07-03 (×18): 20 mg via ORAL
  Filled 2023-06-16 (×19): qty 1

## 2023-06-16 MED ORDER — HALOPERIDOL LACTATE 5 MG/ML IJ SOLN: 5.0000 mg | Freq: Three times a day (TID) | INTRAMUSCULAR | Status: DC | PRN

## 2023-06-16 MED ORDER — CLOPIDOGREL BISULFATE 75 MG PO TABS
75.0000 mg | ORAL_TABLET | Freq: Every day | ORAL | Status: DC
  Administered 2023-06-17 – 2023-07-04 (×18): 75 mg via ORAL
  Filled 2023-06-16 (×18): qty 1

## 2023-06-16 MED ORDER — DOXYCYCLINE HYCLATE 100 MG PO TABS: 100.0000 mg | ORAL_TABLET | Freq: Two times a day (BID) | ORAL | Status: DC

## 2023-06-16 MED ORDER — PREDNISONE 10 MG PO TABS
40.0000 mg | ORAL_TABLET | Freq: Every day | ORAL | Status: DC
  Administered 2023-06-17 – 2023-06-21 (×5): 40 mg via ORAL
  Filled 2023-06-16 (×5): qty 4

## 2023-06-16 MED ORDER — IPRATROPIUM-ALBUTEROL 0.5-2.5 (3) MG/3ML IN SOLN
RESPIRATORY_TRACT | Status: AC
  Filled 2023-06-16: qty 3

## 2023-06-16 MED ORDER — ASPIRIN 81 MG PO TBEC
81.0000 mg | DELAYED_RELEASE_TABLET | Freq: Every day | ORAL | Status: DC
  Administered 2023-06-17 – 2023-07-04 (×18): 81 mg via ORAL
  Filled 2023-06-16 (×18): qty 1

## 2023-06-16 MED ORDER — FAMOTIDINE 20 MG PO TABS
20.0000 mg | ORAL_TABLET | Freq: Every day | ORAL | Status: DC
  Administered 2023-06-17 – 2023-07-04 (×18): 20 mg via ORAL
  Filled 2023-06-16 (×18): qty 1

## 2023-06-16 MED ORDER — IPRATROPIUM-ALBUTEROL 0.5-2.5 (3) MG/3ML IN SOLN
3.0000 mL | Freq: Three times a day (TID) | RESPIRATORY_TRACT | Status: DC
  Administered 2023-06-16 – 2023-06-19 (×9): 3 mL via RESPIRATORY_TRACT
  Filled 2023-06-16 (×8): qty 3

## 2023-06-16 MED ORDER — PREDNISONE 20 MG PO TABS: 40.0000 mg | ORAL_TABLET | Freq: Every day | ORAL | Status: DC

## 2023-06-16 MED ORDER — LORAZEPAM 1 MG PO TABS: 2.0000 mg | ORAL_TABLET | Freq: Three times a day (TID) | ORAL | Status: DC | PRN

## 2023-06-16 NOTE — Group Note (Unsigned)
Date:  06/16/2023 Time:  10:32 PM  Group Topic/Focus:  Wrap-Up Group:   The focus of this group is to help patients review their daily goal of treatment and discuss progress on daily workbooks.     Participation Level:  {BHH PARTICIPATION XBJYN:82956}  Participation Quality:  {BHH PARTICIPATION QUALITY:22265}  Affect:  {BHH AFFECT:22266}  Cognitive:  {BHH COGNITIVE:22267}  Insight: {BHH Insight2:20797}  Engagement in Group:  {BHH ENGAGEMENT IN OZHYQ:65784}  Modes of Intervention:  {BHH MODES OF INTERVENTION:22269}  Additional Comments:  ***  Lenore Cordia 06/16/2023, 10:32 PM

## 2023-06-16 NOTE — Group Note (Signed)
Recreation Therapy Group Note   Group Topic:General Recreation  Group Date: 06/16/2023 Start Time: 1400 End Time: 1455 Facilitators: Rosina Lowenstein, LRT, CTRS Location: Courtyard  Group Description: Outdoor Recreation. Patients had the option to play corn hole, ring toss, bowling or listening to music while outside in the courtyard getting fresh air and sunlight. Marland Kitchen LRT and patients discussed things that they enjoy doing in their free time outside of the hospital. LRT encouraged patients to drink water after being active and getting their heart rate up.   Goal Area(s) Addressed: Patient will identify leisure interests.  Patient will practice healthy decision making. Patient will engage in recreation activity.   Affect/Mood: N/A   Participation Level: Did not attend    Clinical Observations/Individualized Feedback: [name] did not attend group due to not being on the unit yet.   Plan: Continue to engage patient in RT group sessions 2-3x/week.   Rosina Lowenstein, LRT, CTRS 06/16/2023 3:23 PM

## 2023-06-16 NOTE — Progress Notes (Signed)
Mobility Specialist - Progress Note   06/16/23 1100  Mobility  Activity Ambulated with assistance in hallway  Level of Assistance Standby assist, set-up cues, supervision of patient - no hands on  Assistive Device None  Distance Ambulated (ft) 200 ft  Activity Response Tolerated well  $Mobility charge 1 Mobility     Pre-mobility: 103 HR, 99% SpO2 During mobility: 127 HR, 95% SpO2 Post-mobility: 112 HR, 97% SpO2   Pt lying in bed upon arrival, utilizing RA. Pt agreeable to activity. A little impulsive but follows commands well. Pt completed bed mobility independently. STS and ambulation with supervision. No LOB. Cues for pacing activity. Fatigued with activity. HR increased to 127 with ambulation but O2 maintained >/= 95%. SOB. Pt does become very wheezy post-ambulation, but did resolve with PLB. Pt returned to room with sitter present. RN notified.    Filiberto Pinks Mobility Specialist 06/16/23, 11:47 AM

## 2023-06-16 NOTE — Plan of Care (Signed)
  Problem: Education: Goal: Knowledge of disease or condition will improve Outcome: Progressing Goal: Knowledge of the prescribed therapeutic regimen will improve Outcome: Progressing Goal: Individualized Educational Video(s) Outcome: Progressing   Problem: Activity: Goal: Ability to tolerate increased activity will improve Outcome: Progressing Goal: Will verbalize the importance of balancing activity with adequate rest periods Outcome: Progressing   Problem: Pain Management: Goal: General experience of comfort will improve Outcome: Progressing   Problem: Safety: Goal: Ability to remain free from injury will improve Outcome: Progressing   Problem: Skin Integrity: Goal: Risk for impaired skin integrity will decrease Outcome: Progressing

## 2023-06-16 NOTE — Tx Team (Signed)
Initial Treatment Plan 06/16/2023 5:09 PM [name] WUJ:[id number]    PATIENT STRESSORS: Health problems   Other: AVH experiences     PATIENT STRENGTHS: Communication skills  Supportive family/friends    PATIENT IDENTIFIED PROBLEMS: Ineffective coping skills  Experiencing AVH                   DISCHARGE CRITERIA:  Improved stabilization in mood, thinking, and/or behavior Motivation to continue treatment in a less acute level of care  PRELIMINARY DISCHARGE PLAN: Attend aftercare/continuing care group Return to previous living arrangement  PATIENT/FAMILY INVOLVEMENT: This treatment plan has been presented to and reviewed with the patient, [name], and/or family member.  The patient and family have been given the opportunity to ask questions and make suggestions.  Sharin Mons, RN 06/16/2023, 5:09 PM

## 2023-06-16 NOTE — Plan of Care (Signed)
D: Pt alert and oriented. Pt reports experiencing anxiety/depression at this time. Pt denies experiencing any pain at this time. Pt denies experiencing any HI at this time however endorses experiencing SI w/o plan while here and AVH. Pt states he hears voices telling him to kill himself and sees dead relatives at night.   A: Support and encouragement provided. Frequent verbal contact made. Routine safety checks conducted q15 minutes.   R:  Pt verbally contracts for safety at this time. Pt interacts well with others on the unit. Pt remains safe at this time. Plan of care ongoing.  When asked what brought pt in the pt replied he was having thoughts to cut himself with no intent to act on the thought. Pt also reports hearing voices to kill himself, seeing dead relatives at night, and not being able to sleep at night.   Pt's skin assessment was preformed upon admission. Pt's skin was dry, flaky, but intact.   Pt was fidgety/calm/and cooperative upon admission assessment.   Problem: Education: Goal: Knowledge of [facility] Education information/materials will improve Outcome: Progressing Goal: Verbalization of understanding the information provided will improve Outcome: Progressing

## 2023-06-16 NOTE — Discharge Summary (Signed)
Physician Discharge Summary   Patient: [name] MRN: [id number] DOB: Jan 08, 1961  Admit date:     06/13/2023  Discharge date: 06/16/23  Discharge Physician: Loyce Dys   PCP: Emogene Morgan, MD   Recommendations at discharge:  Follow-up with psychiatry  Discharge Diagnoses: Acute COPD exacerbation Insomnia Right-sided pulmonary nodule History of adenocarcinoma of LUL History of schizophrenia HTN     Hospital Course: [name] is a 62 y.o. male with medical history significant of COPD Gold stage II in 2015, HTN, HLD, anxiety/depression, left upper lung mass presented with multiple complaints including persistent insomnia, cough, wheezing.  Patient was admitted and managed for acute exacerbation of COPD.  Respiratory function is now improving patient cleared for discharge from internal medicine service.  He will have to complete a total of 5 days of steroid therapy as well as doxycycline.  3 more days left. Patient to follow-up with PCP as well as pulmonologist after inpatient psych discharge.  Consultants: Psychiatry Procedures performed: None Disposition: Inpatient psych Diet recommendation:  Cardiac diet DISCHARGE MEDICATION: Allergies as of 06/16/2023   No Known Allergies      Medication List     STOP taking these medications    clonazePAM 1 MG tablet Commonly known as: KLONOPIN   Fish Oil 1000 MG Cpdr   potassium chloride SA 20 MEQ tablet Commonly known as: KLOR-CON M       TAKE these medications    albuterol 108 (90 Base) MCG/ACT inhaler Commonly known as: VENTOLIN HFA Inhale 2 puffs into the lungs every 4 (four) hours as needed for wheezing or shortness of breath.   aspirin EC 81 MG tablet Take 81 mg by mouth daily. Swallow whole.   clopidogrel 75 MG tablet Commonly known as: PLAVIX Take 1 tablet (75 mg total) by mouth daily.   dextromethorphan-guaiFENesin 10-100 MG/5ML liquid Commonly known as: ROBITUSSIN-DM Take 10 mLs by mouth every 6  (six) hours as needed for cough.   diltiazem 240 MG 24 hr capsule Commonly known as: CARDIZEM CD Take 1 capsule (240 mg total) by mouth daily.   docusate sodium 100 MG capsule Commonly known as: COLACE Take 100 mg by mouth 2 (two) times daily as needed for mild constipation.   doxycycline 100 MG tablet Commonly known as: VIBRA-TABS Take 1 tablet (100 mg total) by mouth every 12 (twelve) hours for 3 days.   famotidine 20 MG tablet Commonly known as: PEPCID Take 1 tablet (20 mg total) by mouth daily.   guaiFENesin 600 MG 12 hr tablet Commonly known as: MUCINEX Take 600 mg by mouth 2 (two) times daily as needed.   ipratropium-albuterol 0.5-2.5 (3) MG/3ML Soln Commonly known as: DUONEB Take 3 mLs by nebulization 3 (three) times daily.   lovastatin 20 MG tablet Commonly known as: MEVACOR Take 20 mg by mouth at bedtime.   mirtazapine 30 MG tablet Commonly known as: REMERON Take 1 tablet (30 mg total) by mouth at bedtime.   montelukast 10 MG tablet Commonly known as: SINGULAIR Take 1 tablet (10 mg total) by mouth at bedtime.   paliperidone 9 MG 24 hr tablet Commonly known as: INVEGA Take 1 tablet (9 mg total) by mouth at bedtime.   predniSONE 20 MG tablet Commonly known as: DELTASONE Take 2 tablets (40 mg total) by mouth daily with breakfast for 3 days.   prochlorperazine 10 MG tablet Commonly known as: COMPAZINE Take 1 tablet (10 mg total) by mouth every 6 (six) hours as needed for  nausea or vomiting.   traZODone 150 MG tablet Commonly known as: DESYREL Take 1 tablet (150 mg total) by mouth at bedtime as needed for sleep.   Trelegy Ellipta 100-62.5-25 MCG/ACT Aepb Generic drug: Fluticasone-Umeclidin-Vilant Inhale 1 puff into the lungs daily.        Discharge Exam: Ceasar Mons Weights   06/13/23 1240 06/13/23 2148  Weight: 105.2 kg 101.8 kg   General: Elderly male in no acute distress Neck: normal, supple, no masses, no thyromegaly Respiratory: No increased  work of breathing Cardiovascular: Regular rate and rhythm, no murmurs / rubs / gallops. No extremity edema. 2+ pedal pulses. No carotid bruits.  Abdomen: no tenderness, no masses palpated. No hepatosplenomegaly. Bowel sounds positive.  Musculoskeletal: no clubbing / cyanosis. No joint deformity upper and lower extremities. Good ROM, no contractures. Normal muscle tone.  Skin: no rashes, lesions, ulcers. No induration Neurologic: CN 2-12 grossly intact. Sensation intact, DTR normal.     Condition at discharge: good  The results of significant diagnostics from this hospitalization (including imaging, microbiology, ancillary and laboratory) are listed below for reference.   Imaging Studies: DG Chest Port 1 View  Result Date: 06/13/2023 CLINICAL DATA:  COPD exacerbation, cough, and wheezing EXAM: PORTABLE CHEST 1 VIEW COMPARISON:  Chest radiograph dated 05/28/2023 FINDINGS: Normal lung volumes. Bilateral lower lung linear opacities. No pleural effusion or pneumothorax. The heart size and mediastinal contours are within normal limits. No acute osseous abnormality. IMPRESSION: Bilateral lower lung linear opacities, likely atelectasis. Electronically Signed   By: Agustin Cree M.D.   On: 06/13/2023 15:56   DG Chest Port 1 View  Result Date: 05/28/2023 CLINICAL DATA:  Cough. EXAM: PORTABLE CHEST 1 VIEW COMPARISON:  Radiograph and CT 05/25/2023 FINDINGS: Again seen emphysema. Streaky opacity both lung bases, right greater than left. Scarring in the periphery of the right mid lung. Stable heart size and mediastinal contours allowing for lower lung bases. No pleural effusion or pneumothorax. IMPRESSION: 1. Streaky opacity both lung bases, right greater than left, favor atelectasis. The possibility of pneumonia is not entirely excluded. 2. Emphysema. Electronically Signed   By: Narda Rutherford M.D.   On: 05/28/2023 14:04   CT Angio Chest PE W and/or Wo Contrast  Result Date: 05/25/2023 CLINICAL DATA:   Shortness of breath, productive cough for 3 days; * Tracking Code: BO * EXAM: CT ANGIOGRAPHY CHEST WITH CONTRAST TECHNIQUE: Multidetector CT imaging of the chest was performed using the standard protocol during bolus administration of intravenous contrast. Multiplanar CT image reconstructions and MIPs were obtained to evaluate the vascular anatomy. RADIATION DOSE REDUCTION: This exam was performed according to the departmental dose-optimization program which includes automated exposure control, adjustment of the mA and/or kV according to patient size and/or use of iterative reconstruction technique. CONTRAST:  OMNIPAQUE IOHEXOL 350 MG/ML SOLN COMPARISON:  Multiple priors, most recent chest CT dated March 10, 2023 FINDINGS: Cardiovascular: No evidence of pulmonary embolus. Limited evaluation of the segmental and subsegmental pulmonary arteries due to motion artifact. Normal heart size. No pericardial effusion. Normal caliber thoracic aorta with moderate atherosclerotic disease. Severe coronary artery calcifications. Dilated main pulmonary artery, measuring up to 3.7 cm. Mediastinum/Nodes: Esophagus and thyroid are unremarkable. No enlarged lymph nodes seen in the chest. Lungs/Pleura: Central airways are patent. Emphysema. Spiculated solid nodule of the left upper lobe measuring 14 x 13 mm on series 5, image 47, previously 16 x 14 mm. Stable posttreatment change of the right upper lobe. New right lower lobe consolidation. New solid pulmonary nodule  of the right upper lobe measuring 4 mm on series 5, image 66. Upper Abdomen: Partially visualized low-attenuation renal lesions, likely simple cysts. Stable bilateral adrenal adenomas. Gallstones. Musculoskeletal: No chest wall abnormality. No acute or significant osseous findings. Review of the MIP images confirms the above findings. IMPRESSION: 1. No evidence of pulmonary embolus. Limited evaluation of the segmental and subsegmental pulmonary arteries due to motion  artifact. 2. New mild right lower lobe consolidation, likely due to infection or aspiration. 3. Spiculated nodule of the right upper lobe is slightly decreased in size. 4. New small solid pulmonary nodule of the right upper lobe measuring 4 mm. Recommend attention on follow-up per clinical protocol. 5. Stable posttreatment change of the right upper lobe. Electronically Signed   By: Allegra Lai M.D.   On: 05/25/2023 12:25   DG Chest Port 1 View  Result Date: 05/25/2023 CLINICAL DATA:  Questionable sepsis - evaluate for abnormality. Shortness of breath. History of lung cancer. COPD. Productive cough for 3 days. EXAM: PORTABLE CHEST 1 VIEW COMPARISON:  03/15/2023. FINDINGS: Redemonstration of linear areas of fibrosis/scarring overlying the right mid lung zone, laterally. There is a new linear area of probable atelectasis overlying the left lower lung zone. Bilateral lung fields are otherwise clear. No acute consolidation or lung collapse. Bilateral costophrenic angles are clear. Normal cardio-mediastinal silhouette. No acute osseous abnormalities. The soft tissues are within normal limits. IMPRESSION: *No acute cardiopulmonary disease. Electronically Signed   By: Jules Schick M.D.   On: 05/25/2023 08:48    Microbiology: Results for orders placed or performed during the hospital encounter of 06/13/23  Blood Culture (routine x 2)     Status: None (Preliminary result)   Collection Time: 06/13/23  1:19 PM   Specimen: BLOOD  Result Value Ref Range Status   Specimen Description BLOOD BLOOD RIGHT ARM  Final   Special Requests   Final    BOTTLES DRAWN AEROBIC AND ANAEROBIC Blood Culture adequate volume   Culture   Final    NO GROWTH 3 DAYS Performed at [facility], [address], [place], [place] [address]    Report Status PENDING  Incomplete  Blood Culture (routine x 2)     Status: None (Preliminary result)   Collection Time: 06/13/23  1:21 PM   Specimen: BLOOD  Result Value Ref  Range Status   Specimen Description BLOOD RIGHT ANTECUBITAL  Final   Special Requests   Final    BOTTLES DRAWN AEROBIC AND ANAEROBIC Blood Culture adequate volume   Culture   Final    NO GROWTH 3 DAYS Performed at [facility], [address] [place], [place] [address]    Report Status PENDING  Incomplete  Resp panel by RT-PCR (RSV, Flu A&B, Covid) Anterior Nasal Swab     Status: None   Collection Time: 06/13/23  1:23 PM   Specimen: Anterior Nasal Swab  Result Value Ref Range Status   SARS Coronavirus 2 by RT PCR NEGATIVE NEGATIVE Final    Comment: (NOTE) SARS-CoV-2 target nucleic acids are NOT DETECTED.  The SARS-CoV-2 RNA is generally detectable in upper respiratory specimens during the acute phase of infection. The lowest concentration of SARS-CoV-2 viral copies this assay can detect is 138 copies/mL. A negative result does not preclude SARS-Cov-2 infection and should not be used as the sole basis for treatment or other patient management decisions. A negative result may occur with  improper specimen collection/handling, submission of specimen other than nasopharyngeal swab, presence of viral mutation(s)  within the areas targeted by this assay, and inadequate number of viral copies(<138 copies/mL). A negative result must be combined with clinical observations, patient history, and epidemiological information. The expected result is Negative.  Fact Sheet for Patients:  BloggerCourse.com  Fact Sheet for Healthcare Providers:  SeriousBroker.it  This test is no t yet approved or cleared by the [place] FDA and  has been authorized for detection and/or diagnosis of SARS-CoV-2 by FDA under an Emergency Use Authorization (EUA). This EUA will remain  in effect (meaning this test can be used) for the duration of the COVID-19 declaration under Section 564(b)(1) of the Act, 21 U.S.C.section 360bbb-3(b)(1), unless the  authorization is terminated  or revoked sooner.       Influenza A by PCR NEGATIVE NEGATIVE Final   Influenza B by PCR NEGATIVE NEGATIVE Final    Comment: (NOTE) The Xpert Xpress SARS-CoV-2/FLU/RSV plus assay is intended as an aid in the diagnosis of influenza from Nasopharyngeal swab specimens and should not be used as a sole basis for treatment. Nasal washings and aspirates are unacceptable for Xpert Xpress SARS-CoV-2/FLU/RSV testing.  Fact Sheet for Patients: BloggerCourse.com  Fact Sheet for Healthcare Providers: SeriousBroker.it  This test is not yet approved or cleared by the [place] FDA and has been authorized for detection and/or diagnosis of SARS-CoV-2 by FDA under an Emergency Use Authorization (EUA). This EUA will remain in effect (meaning this test can be used) for the duration of the COVID-19 declaration under Section 564(b)(1) of the Act, 21 U.S.C. section 360bbb-3(b)(1), unless the authorization is terminated or revoked.     Resp Syncytial Virus by PCR NEGATIVE NEGATIVE Final    Comment: (NOTE) Fact Sheet for Patients: BloggerCourse.com  Fact Sheet for Healthcare Providers: SeriousBroker.it  This test is not yet approved or cleared by the [place] FDA and has been authorized for detection and/or diagnosis of SARS-CoV-2 by FDA under an Emergency Use Authorization (EUA). This EUA will remain in effect (meaning this test can be used) for the duration of the COVID-19 declaration under Section 564(b)(1) of the Act, 21 U.S.C. section 360bbb-3(b)(1), unless the authorization is terminated or revoked.  Performed at [facility], [address] [place], [place] [address]     Labs: CBC: Recent Labs  Lab 06/13/23 1241 06/15/23 0422 06/16/23 0513  WBC 6.2 6.4 5.3  NEUTROABS  --  5.2 3.8  HGB 11.6* 9.5* 9.5*  HCT 35.1* 27.6* 28.9*  MCV  94.9 92.3 95.4  PLT 157 153 154   Basic Metabolic Panel: Recent Labs  Lab 06/13/23 1241 06/15/23 0422 06/16/23 0513  NA 137 138 137  K 4.0 4.0 3.8  CL 104 105 103  CO2 24 26 25   GLUCOSE 102* 103* 100*  BUN 12 13 14   CREATININE 0.67 0.60* 0.56*  CALCIUM 9.1 8.9 9.0   Liver Function Tests: Recent Labs  Lab 06/13/23 1241  AST 19  ALT 26  ALKPHOS 60  BILITOT 0.9  PROT 6.9  ALBUMIN 3.7   CBG: No results for input(s): "GLUCAP" in the last 168 hours.  Discharge time spent:  .  Signed: Loyce Dys, MD [facility] 06/16/2023

## 2023-06-16 NOTE — Care Management Important Message (Signed)
Important Message  Patient Details  Name: [name] MRN: [id number] Date of Birth: 1961-05-06   Important Message Given:  N/A - LOS <3 / Initial given by admissions     [name] [name] 06/16/2023, 2:48 PM

## 2023-06-16 NOTE — Progress Notes (Signed)
IV removed. Security here to transfer the patient with staff to the [place] unit

## 2023-06-17 DIAGNOSIS — F203 Undifferentiated schizophrenia: Secondary | ICD-10-CM | POA: Diagnosis not present

## 2023-06-17 LAB — LIPID PANEL
Cholesterol: 172 mg/dL (ref 0–200)
HDL: 78 mg/dL (ref >40)
LDL Cholesterol: 74 mg/dL (ref 0–99)
Total CHOL/HDL Ratio: 2.2 {ratio}
Triglycerides: 102 mg/dL (ref <150)
VLDL: 20 mg/dL (ref 0–40)

## 2023-06-17 LAB — TSH: TSH: 0.325 u[IU]/mL — ABNORMAL LOW (ref 0.350–4.500)

## 2023-06-17 LAB — HEMOGLOBIN A1C
Hgb A1c MFr Bld: 5.6 % (ref 4.8–5.6)
Mean Plasma Glucose: 114.02 mg/dL

## 2023-06-17 MED ORDER — TRAZODONE HCL 100 MG PO TABS
200.0000 mg | ORAL_TABLET | Freq: Every evening | ORAL | Status: DC | PRN
  Administered 2023-06-17 – 2023-07-03 (×17): 200 mg via ORAL
  Filled 2023-06-17 (×17): qty 2

## 2023-06-17 MED ORDER — MIRTAZAPINE 15 MG PO TABS
15.0000 mg | ORAL_TABLET | Freq: Every day | ORAL | Status: DC
  Administered 2023-06-17: 15 mg via ORAL
  Filled 2023-06-17: qty 1

## 2023-06-17 NOTE — [facility] Suicide Risk Assessment (Addendum)
[facility] Admission Suicide Risk Assessment   Nursing information obtained from:  Patient Demographic factors:  Male, Divorced or widowed, Low socioeconomic status, Unemployed Current Mental Status:  Self-harm thoughts Loss Factors:  NA Historical Factors:  NA Risk Reduction Factors:  Positive social support  Total Time spent with patient: 1 hour Principal Problem: Undifferentiated schizophrenia (HCC) Diagnosis:  Principal Problem:   Undifferentiated schizophrenia (HCC)  Subjective Data: depression and auditory hallucinations  Continued Clinical Symptoms:  Alcohol Use Disorder Identification Test Final Score (AUDIT): 0 The "Alcohol Use Disorders Identification Test", Guidelines for Use in Primary Care, Second Edition.  World Science writer [facility]). Score between 0-7:  no or low risk or alcohol related problems. Score between 8-15:  moderate risk of alcohol related problems. Score between 16-19:  high risk of alcohol related problems. Score 20 or above:  warrants further diagnostic evaluation for alcohol dependence and treatment.   CLINICAL FACTORS:   Schizophrenia:   Command hallucinatons Paranoid or undifferentiated type   Musculoskeletal: Strength & Muscle Tone: within normal limits Gait & Station: normal Patient leans: N/A  Psychiatric Specialty Exam: Physical Exam Vitals and nursing note reviewed.  Constitutional:      Appearance: Normal appearance.  HENT:     Head: Normocephalic.     Nose: Nose normal.  Pulmonary:     Effort: Pulmonary effort is normal.  Musculoskeletal:        General: Normal range of motion.     Cervical back: Normal range of motion.  Neurological:     General: No focal deficit present.     Mental Status: He is alert and oriented to person, place, and time.       Review of Systems  All other systems reviewed and are negative.    Blood pressure 100/60, pulse 79, temperature 98.2 F (36.8 C), resp. rate 16, height 6\' 1"  (1.854 m), weight  100.2 kg, SpO2 94%.Body mass index is 29.16 kg/m.  General Appearance: Casual  Eye Contact:  Fair  Speech:  Normal Rate  Volume:  Normal  Mood:  Depressed  Affect:  Congruent  Thought Process:  Coherent  Orientation:  Full (Time, Place, and Person)  Thought Content:  WDL  Suicidal Thoughts:  No  Homicidal Thoughts:  No  Memory:  Immediate;   Good Recent;   Good Remote;   Good  Judgement:  Fair  Insight:  Fair  Psychomotor Activity:  Normal  Concentration:  Concentration: Good and Attention Span: Good  Recall:  Good  Fund of Knowledge:  Good  Language:  Good  Akathisia:  No  Handed:  Right  AIMS (if indicated):     Assets:  Communication Skills Housing  ADL's:  Intact  Cognition:  WNL  Sleep: Sleep      Physical Exam: Physical Exam Vitals and nursing note reviewed.  Constitutional:      Appearance: Normal appearance.  HENT:     Head: Normocephalic.     Nose: Nose normal.  Pulmonary:     Effort: Pulmonary effort is normal.  Musculoskeletal:        General: Normal range of motion.     Cervical back: Normal range of motion.  Neurological:     General: No focal deficit present.     Mental Status: He is alert and oriented to person, place, and time.    Review of Systems  Psychiatric/Behavioral:  Positive for depression. The patient is nervous/anxious and has insomnia.   All other systems reviewed and are negative.  Blood  pressure 100/60, pulse 79, temperature 98.2 F (36.8 C), resp. rate 16, height 6\' 1"  (1.854 m), weight 100.2 kg, SpO2 94%. Body mass index is 29.16 kg/m.   COGNITIVE FEATURES THAT CONTRIBUTE TO RISK:  None    SUICIDE RISK:   Mild:  Suicidal ideation of limited frequency, intensity, duration, and specificity.  There are no identifiable plans, no associated intent, mild dysphoria and related symptoms, good self-control (both objective and subjective assessment), few other risk factors, and identifiable protective factors, including available  and accessible social support.  PLAN OF CARE:  Schizophrenia, undifferentiated Invega 12 mg daily EKG without concerns, TSH, lipid panel, and A1C ordered  Insomnia Remeron 30 mg at bedtime reduced to 15 mg to more effectively hit the histamine receptor Trazodone 150 mg at bedtime increased to 200 mg daily at bedtime  I certify that inpatient services furnished can reasonably be expected to improve the patient's condition.   Nanine Means, NP 06/17/2023, 10:00 AM

## 2023-06-17 NOTE — Group Note (Signed)
Date:  06/17/2023 Time:  1:29 AM  Group Topic/Focus:  Goals Group:   The focus of this group is to help patients establish daily goals to achieve during treatment and discuss how the patient can incorporate goal setting into their daily lives to aide in recovery. Recovery Goals:   The focus of this group is to identify appropriate goals for recovery and establish a plan to achieve them. Relapse Prevention Planning:   The focus of this group is to define relapse and discuss the need for planning to combat relapse.    Participation Level:  Active  Participation Quality:  Sharing  Affect:  Appropriate  Cognitive:  Appropriate  Insight: Good  Engagement in Group:  Engaged  Modes of Intervention:  Activity and Discussion  Additional Comments:    Dallie Piles Jediah Horger 06/17/2023, 1:29 AM

## 2023-06-17 NOTE — H&P (Addendum)
Psychiatric Admission Assessment Adult  Patient Identification: [name] MRN:  [id number] Date of Evaluation:  06/17/2023 Chief Complaint:  Schizophrenia (HCC) [F20.9] Principal Diagnosis: Undifferentiated schizophrenia (HCC) Diagnosis:  Principal Problem:   Undifferentiated schizophrenia (HCC)  History of Present Illness: [name] is a 62 y.o. male with medical history significant of COPD Gold stage II in 2015, HTN, HLD, anxiety/depression, left upper lung mass presented with multiple complaints including persistent insomnia, cough, wheezing. Once he was stabilized medically w/ COPD exacerbation, he was transferred to [facility] for evaluation and management. [name] was resting in bed on his side during the interview. He was pleasant, calm, and cooperative. [name] reports hearing a male voice at night that commands him to kill himself with no intent to follow through with this command. He also reported to the RN that he sees deceased relatives at night at times. He describes moderate depression and mild anxiety/worry about his lung cancer diagnosis. Denies suicidal or homicidal ideation. [name] reports broken sleep, "up and down". He ate breakfast and has a good appetite. Denies adverse side-effects of his medication. He looks forward to returning to his Group Home.   Per Corlis Hove, PMHNP on the consult floor on 11/5: Pt chart reviewed and assessed face to face. Pt reports presenting to [facility] with police for "hearing voices, haven't slept in 4 days". Reports poor sleep and appetite. Reports worsening auditory hallucinations for the past 2 days. Auditory hallucinations are command in nature and tell him to "kill myself, to cut my throat", to "hurt people in the house, jump on them". He does not want to act on these commands, although reports they are hard to ignore. He has been experiencing suicidal and homicidal ideations as a result. He reports he has also been experiencing visual  hallucinations of "dead people". Denies current auditory visual hallucinations, last experienced them 20 minutes ago. He reports he is also experiencing paranoia, but states he cannot describe it and is "just paranoid".    Pt currently resides in a group home, [facility]. Reports he has been living at the group home for about 3 years and likes it there. Reports he was with group home staff, Marlett, prior to being brought to the hospital. He gives verbal consent for his group home to be contacted and provides phone number as [phone].   He reports remote history of suicide attempt, last occurring "long time ago", when he cut his arm. He denies history of non suicidal self injurious behavior. He reports past inpatient psychiatric hospitalizations. Per chart review, he had a recent inpatient psychiatric hospitalization at [facility] from 03/25/23-04/07/23 after presenting to the emergency department, had told the emergency department at that time he was experiencing AV/H and had not slept in 4 days.    Pt denies knowledge of family psychiatric history.    He states his sister, brother, and daughter live in [place].    He denies use of alcohol, marijuana, crack/cocaine, methamphetamines or other substances.    Collateral per group home staff, Marlett, [phone]. Per Marlett, pt came home from cancer treatment today and reported being overwhelmed by the voices and concerns he would hurt himself. States she called 911 and pt was transported to the emergency department. Pt is allowed to return to the group home once he is medically and psychiatrically stabilized. Pt is non-violent and has not hurt anybody in the home. She states pt is medication adherent, have been taking them as prescribed, and does not refuse his  medications  Associated Signs/Symptoms: Depression Symptoms:  fatigue, (Hypo) Manic Symptoms:   None Anxiety Symptoms:   mild worry r/t medical condition Psychotic Symptoms:   Hallucinations: Command:  "kill yourself" PTSD Symptoms: Negative Total Time spent with patient: 1 hour  Past Psychiatric History: schizophrenia  Is the patient at risk to self? Yes.    Has the patient been a risk to self in the past 6 months? Yes.    Has the patient been a risk to self within the distant past? Yes.    Is the patient a risk to others? No.  Has the patient been a risk to others in the past 6 months? No.  Has the patient been a risk to others within the distant past? No.   [place] Scale:  Flowsheet Row Admission (Current) from 06/16/2023 in [facility] ED to Hosp-Admission (Discharged) from 06/13/2023 in [facility] ED to Hosp-Admission (Discharged) from 05/25/2023 in [facility] CARDIAC MED PCU  C-SSRS RISK CATEGORY Low Risk No Risk No Risk        Prior Inpatient Therapy: Yes.   If yes, describe:  Multiple Prior Outpatient Therapy: Yes.   If yes, describe:  does not remember the name   Alcohol Screening: 1. How often do you have a drink containing alcohol?: Never 2. How many drinks containing alcohol do you have on a typical day when you are drinking?: 1 or 2 3. How often do you have six or more drinks on one occasion?: Never AUDIT-C Score: 0 4. How often during the last year have you found that you were not able to stop drinking once you had started?: Never 5. How often during the last year have you failed to do what was normally expected from you because of drinking?: Never 6. How often during the last year have you needed a first drink in the morning to get yourself going after a heavy drinking session?: Never 7. How often during the last year have you had a feeling of guilt of remorse after drinking?: Never 8. How often during the last year have you been unable to remember what happened the night before because you had been drinking?: Never 9. Have you or someone else been injured as a result of your  drinking?: No 10. Has a relative or friend or a doctor or another health worker been concerned about your drinking or suggested you cut down?: No Alcohol Use Disorder Identification Test Final Score (AUDIT): 0 Alcohol Brief Interventions/Follow-up: Alcohol education/Brief advice Substance Abuse History in the last 12 months:  No. Consequences of Substance Abuse: NA Previous Psychotropic Medications: Yes  Psychological Evaluations: Yes  Past Medical History:  Past Medical History:  Diagnosis Date   Acute on chronic respiratory failure with hypoxia (HCC)    Asthma    Coagulation disorder (HCC)    COPD (chronic obstructive pulmonary disease) (HCC)    Depression    History of hiatal hernia    Hypertension    Hypokalemia    Leukocytosis    Lung mass    Pneumonia    Schizophrenia (HCC)    Shortness of breath dyspnea    Stroke [facility])    Umbilical hernia    Ventral hernia     Past Surgical History:  Procedure Laterality Date   COLONOSCOPY WITH PROPOFOL N/A 09/21/2021   Procedure: COLONOSCOPY WITH PROPOFOL;  Surgeon: Regis Bill, MD;  Location: [facility] ENDOSCOPY;  Service: Endoscopy;  Laterality: N/A;   HERNIA REPAIR  INSERTION OF MESH N/A 12/18/2014   Procedure: INSERTION OF MESH;  Surgeon: Lattie Haw, MD;  Location: [facility] ORS;  Service: General;  Laterality: N/A;   SUPRA-UMBILICAL HERNIA  12/18/2014   Procedure: SUPRA-UMBILICAL HERNIA;  Surgeon: Lattie Haw, MD;  Location: [facility] ORS;  Service: General;;   UMBILICAL HERNIA REPAIR N/A 12/18/2014   Procedure: HERNIA REPAIR UMBILICAL ADULT;  Surgeon: Lattie Haw, MD;  Location: [facility] ORS;  Service: General;  Laterality: N/A;   VIDEO BRONCHOSCOPY WITH ENDOBRONCHIAL ULTRASOUND N/A 03/15/2023   Procedure: VIDEO BRONCHOSCOPY WITH ENDOBRONCHIAL ULTRASOUND;  Surgeon: Vida Rigger, MD;  Location: [facility] ORS;  Service: Thoracic;  Laterality: N/A;   Family History:  Family History  Problem Relation Age of Onset   Asthma  Mother    Hypertension Father    Family Psychiatric  History: none Tobacco Screening:  Social History   Tobacco Use  Smoking Status Every Day   Current packs/day: 0.15   Average packs/day: 0.2 packs/day for 45.0 years (6.8 ttl pk-yrs)   Types: Cigarettes  Smokeless Tobacco Never    BH Tobacco Counseling     Are you interested in Tobacco Cessation Medications?  N/A, patient does not use tobacco products Counseled patient on smoking cessation:  N/A, patient does not use tobacco products Reason Tobacco Screening Not Completed: No value filed.       Social History:  Social History   Substance and Sexual Activity  Alcohol Use Not Currently   Alcohol/week: 75.0 standard drinks of alcohol   Types: 75 Cans of beer per week     Social History   Substance and Sexual Activity  Drug Use Not Currently   Types: Marijuana, "Crack" cocaine   Comment: none in 15 yrs    Additional Social History: lives in a group home       Allergies:  No Known Allergies Lab Results:  Results for orders placed or performed during the hospital encounter of 06/13/23 (from the past 48 hour(s))  CBC with Differential/Platelet     Status: Abnormal   Collection Time: 06/16/23  5:13 AM  Result Value Ref Range   WBC 5.3 4.0 - 10.5 K/uL   RBC 3.03 (L) 4.22 - 5.81 MIL/uL   Hemoglobin 9.5 (L) 13.0 - 17.0 g/dL   HCT 29.5 (L) 62.1 - 30.8 %   MCV 95.4 80.0 - 100.0 fL   MCH 31.4 26.0 - 34.0 pg   MCHC 32.9 30.0 - 36.0 g/dL   RDW 65.7 (H) 84.6 - 96.2 %   Platelets 154 150 - 400 K/uL   nRBC 0.0 0.0 - 0.2 %   Neutrophils Relative % 72 %   Neutro Abs 3.8 1.7 - 7.7 K/uL   Lymphocytes Relative 19 %   Lymphs Abs 1.0 0.7 - 4.0 K/uL   Monocytes Relative 8 %   Monocytes Absolute 0.4 0.1 - 1.0 K/uL   Eosinophils Relative 0 %   Eosinophils Absolute 0.0 0.0 - 0.5 K/uL   Basophils Relative 0 %   Basophils Absolute 0.0 0.0 - 0.1 K/uL   Immature Granulocytes 1 %   Abs Immature Granulocytes 0.04 0.00 - 0.07 K/uL     Comment: Performed at [facility], [address] [place], [place] [address]  Basic metabolic panel     Status: Abnormal   Collection Time: 06/16/23  5:13 AM  Result Value Ref Range   Sodium 137 135 - 145 mmol/L   Potassium 3.8 3.5 - 5.1 mmol/L   Chloride 103 98 -  111 mmol/L   CO2 25 22 - 32 mmol/L   Glucose, Bld 100 (H) 70 - 99 mg/dL    Comment: Glucose reference range applies only to samples taken after fasting for at least 8 hours.   BUN 14 8 - 23 mg/dL   Creatinine, Ser 8.46 (L) 0.61 - 1.24 mg/dL   Calcium 9.0 8.9 - 96.2 mg/dL   GFR, Estimated >95 >28 mL/min    Comment: (NOTE) Calculated using the CKD-EPI Creatinine Equation (2021)    Anion gap 9 5 - 15    Comment: Performed at [facility], [address] [place], [place] [address]    Blood Alcohol level:  Lab Results  Component Value Date   [facility] <10 06/13/2023   ETH <10 03/24/2023    Metabolic Disorder Labs:  Lab Results  Component Value Date   HGBA1C 5.8 (H) 03/26/2023   MPG 119.76 03/26/2023   No results found for: "PROLACTIN" Lab Results  Component Value Date   CHOL 108 03/26/2023   TRIG 72 03/26/2023   HDL 43 03/26/2023   CHOLHDL 2.5 03/26/2023   VLDL 14 03/26/2023   LDLCALC 51 03/26/2023    Current Medications: Current Facility-Administered Medications  Medication Dose Route Frequency Provider Last Rate Last Admin   acetaminophen (TYLENOL) tablet 650 mg  650 mg Oral Q6H PRN Penn, Cranston Neighbor, NP       alum & mag hydroxide-simeth (MAALOX/MYLANTA) 200-200-20 MG/5ML suspension 30 mL  30 mL Oral Q4H PRN Penn, Cranston Neighbor, NP       aspirin EC tablet 81 mg  81 mg Oral Daily Sarina Ill, DO   81 mg at 06/17/23 0913   clopidogrel (PLAVIX) tablet 75 mg  75 mg Oral Daily Sarina Ill, DO   75 mg at 06/17/23 0913   diltiazem (CARDIZEM CD) 24 hr capsule 240 mg  240 mg Oral Daily Sarina Ill, DO       diphenhydrAMINE (BENADRYL) capsule 50 mg  50 mg Oral TID PRN Mcneil Sober, NP       Or   diphenhydrAMINE (BENADRYL) injection 50 mg  50 mg Intramuscular TID PRN Mcneil Sober, NP       doxycycline (VIBRA-TABS) tablet 100 mg  100 mg Oral Q12H Sarina Ill, DO   100 mg at 06/17/23 0913   famotidine (PEPCID) tablet 20 mg  20 mg Oral Daily Sarina Ill, DO   20 mg at 06/17/23 0913   fluticasone furoate-vilanterol (BREO ELLIPTA) 100-25 MCG/ACT 1 puff  1 puff Inhalation Daily Sarina Ill, DO   1 puff at 06/17/23 0912   haloperidol (HALDOL) tablet 5 mg  5 mg Oral TID PRN Mcneil Sober, NP       Or   haloperidol lactate (HALDOL) injection 5 mg  5 mg Intramuscular TID PRN Penn, Cranston Neighbor, NP       ipratropium-albuterol (DUONEB) 0.5-2.5 (3) MG/3ML nebulizer solution 3 mL  3 mL Nebulization TID Sarina Ill, DO   3 mL at 06/17/23 0921   LORazepam (ATIVAN) tablet 2 mg  2 mg Oral TID PRN Mcneil Sober, NP       Or   LORazepam (ATIVAN) injection 2 mg  2 mg Intramuscular TID PRN Penn, Cranston Neighbor, NP       magnesium hydroxide (MILK OF MAGNESIA) suspension 30 mL  30 mL Oral Daily PRN Penn, Cicely, NP       mirtazapine (REMERON) tablet 15 mg  15 mg Oral QHS Charm Rings, NP  montelukast (SINGULAIR) tablet 10 mg  10 mg Oral QHS Sarina Ill, DO   10 mg at 06/16/23 2208   paliperidone (INVEGA) 24 hr tablet 12 mg  12 mg Oral Daily Sarina Ill, DO   12 mg at 06/17/23 0915   pravastatin (PRAVACHOL) tablet 20 mg  20 mg Oral q1800 Sarina Ill, DO   20 mg at 06/16/23 2208   predniSONE (DELTASONE) tablet 40 mg  40 mg Oral Q breakfast Sarina Ill, DO   40 mg at 06/17/23 0913   traZODone (DESYREL) tablet 200 mg  200 mg Oral QHS PRN Charm Rings, NP       umeclidinium bromide (INCRUSE ELLIPTA) 62.5 MCG/ACT 1 puff  1 puff Inhalation Daily Sarina Ill, DO   1 puff at 06/17/23 0912   PTA Medications: Medications Prior to Admission  Medication Sig Dispense Refill Last Dose   albuterol  (VENTOLIN HFA) 108 (90 Base) MCG/ACT inhaler Inhale 2 puffs into the lungs every 4 (four) hours as needed for wheezing or shortness of breath.      aspirin EC 81 MG tablet Take 81 mg by mouth daily. Swallow whole.      clopidogrel (PLAVIX) 75 MG tablet Take 1 tablet (75 mg total) by mouth daily. 30 tablet 3    dextromethorphan-guaiFENesin (ROBITUSSIN-DM) 10-100 MG/5ML liquid Take 10 mLs by mouth every 6 (six) hours as needed for cough.      diltiazem (CARDIZEM CD) 240 MG 24 hr capsule Take 1 capsule (240 mg total) by mouth daily. 30 capsule 3    docusate sodium (COLACE) 100 MG capsule Take 100 mg by mouth 2 (two) times daily as needed for mild constipation.      doxycycline (VIBRA-TABS) 100 MG tablet Take 1 tablet (100 mg total) by mouth every 12 (twelve) hours for 3 days.      famotidine (PEPCID) 20 MG tablet Take 1 tablet (20 mg total) by mouth daily. 30 tablet 0    Fluticasone-Umeclidin-Vilant (TRELEGY ELLIPTA) 100-62.5-25 MCG/ACT AEPB Inhale 1 puff into the lungs daily. 28 each 1    guaiFENesin (MUCINEX) 600 MG 12 hr tablet Take 600 mg by mouth 2 (two) times daily as needed.      ipratropium-albuterol (DUONEB) 0.5-2.5 (3) MG/3ML SOLN Take 3 mLs by nebulization 3 (three) times daily. 270 mL 0    lovastatin (MEVACOR) 20 MG tablet Take 20 mg by mouth at bedtime.      mirtazapine (REMERON) 30 MG tablet Take 1 tablet (30 mg total) by mouth at bedtime. 30 tablet 3    montelukast (SINGULAIR) 10 MG tablet Take 1 tablet (10 mg total) by mouth at bedtime. 30 tablet 1    paliperidone (INVEGA) 9 MG 24 hr tablet Take 1 tablet (9 mg total) by mouth at bedtime. 30 tablet 3    predniSONE (DELTASONE) 20 MG tablet Take 2 tablets (40 mg total) by mouth daily with breakfast for 3 days.      prochlorperazine (COMPAZINE) 10 MG tablet Take 1 tablet (10 mg total) by mouth every 6 (six) hours as needed for nausea or vomiting. 90 tablet 0    traZODone (DESYREL) 150 MG tablet Take 1 tablet (150 mg total) by mouth at  bedtime as needed for sleep. 30 tablet 3     Musculoskeletal: Strength & Muscle Tone: within normal limits Gait & Station: normal Patient leans: N/A   Psychiatric Specialty Exam:  Presentation  General Appearance:  Appropriate for Environment  Eye Contact:  Fair  Speech: Clear and Coherent  Speech Volume: Decreased  Handedness: Right   Mood and Affect  Mood: Anxious; Depressed  Affect: Constricted   Thought Process  Thought Processes: Coherent; Goal Directed; Linear  Duration of Psychotic Symptoms: since hospital admission  Past Diagnosis of Schizophrenia or Psychoactive disorder: Yes  Descriptions of Associations:Intact  Orientation:Full (Time, Place and Person)  Thought Content:Logical  Hallucinations:No data recorded Ideas of Reference:Paranoia; Other (comment) ("just anxious")  Suicidal Thoughts:No data recorded Homicidal Thoughts:No data recorded  Sensorium  Memory: Immediate Fair  Judgment: Intact  Insight: Present   Executive Functions  Concentration: Fair  Attention Span: Fair  Recall: [employer] of Knowledge: Fair  Language: Fair   Psychomotor Activity  Psychomotor Activity:No data recorded  Assets  Assets: Communication Skills; Desire for Improvement; Housing   Sleep  Sleep:No data recorded   Physical Exam: Physical Exam Vitals and nursing note reviewed.  Constitutional:      Appearance: Normal appearance.  HENT:     Head: Normocephalic.     Nose: Nose normal.  Pulmonary:     Effort: Pulmonary effort is normal.  Musculoskeletal:        General: Normal range of motion.     Cervical back: Normal range of motion.  Neurological:     General: No focal deficit present.     Mental Status: He is alert and oriented to person, place, and time.    Review of Systems  Psychiatric/Behavioral:  Positive for depression. The patient is nervous/anxious.   All other systems reviewed and are  negative.  Psychiatric Specialty Exam: Physical Exam Vitals and nursing note reviewed.  Constitutional:      Appearance: Normal appearance.  HENT:     Head: Normocephalic.     Nose: Nose normal.  Pulmonary:     Effort: Pulmonary effort is normal.  Musculoskeletal:        General: Normal range of motion.     Cervical back: Normal range of motion.  Neurological:     General: No focal deficit present.     Mental Status: He is alert and oriented to person, place, and time.     Review of Systems  Psychiatric/Behavioral:  Positive for depression. The patient is nervous/anxious.   All other systems reviewed and are negative.   Blood pressure 100/60, pulse 79, temperature 98.2 F (36.8 C), resp. rate 16, height 6\' 1"  (1.854 m), weight 100.2 kg, SpO2 94%.Body mass index is 29.16 kg/m.  General Appearance: Casual  Eye Contact:  Fair  Speech:  Normal Rate  Volume:  Normal  Mood:  Depressed  Affect:  Congruent  Thought Process:  Coherent  Orientation:  Full (Time, Place, and Person)  Thought Content:  WDL  Suicidal Thoughts:  No  Homicidal Thoughts:  No  Memory:  Immediate;   Good Recent;   Good Remote;   Good  Judgement:  Fair  Insight:  Fair  Psychomotor Activity:  Normal  Concentration:  Concentration: Good and Attention Span: Good  Recall:  Good  Fund of Knowledge:  Good  Language:  Good  Akathisia:  No  Handed:  Right  AIMS (if indicated):     Assets:  Communication Skills Housing  ADL's:  Intact  Cognition:  WNL  Sleep: Sleep    Blood pressure 100/60, pulse 79, temperature 98.2 F (36.8 C), resp. rate 16, height 6\' 1"  (1.854 m), weight 100.2 kg, SpO2 94%. Body mass index is 29.16 kg/m.  Treatment Plan Summary: Daily contact with  patient to assess and evaluate symptoms and progress in treatment, Medication management, and Plan : Schizophrenia, undifferentiated Invega 12 mg daily EKG without concerns, TSH, lipid panel, and A1C ordered   Insomnia Remeron 30  mg at bedtime reduced to 15 mg to more effectively hit the histamine receptor Trazodone 150 mg at bedtime increased to 200 mg daily at bedtime  Observation Level/Precautions:  15 minute checks  Laboratory:  Completed, reviewed, stable  Psychotherapy:  individual and group therapy  Medications:  see above  Consultations:  none  Discharge Concerns:  none  Estimated LOS:  5-7 days  Other:     Physician Treatment Plan for Primary Diagnosis: Undifferentiated schizophrenia (HCC) Long Term Goal(s): Improvement in symptoms so as ready for discharge  Short Term Goals: Ability to identify changes in lifestyle to reduce recurrence of condition will improve, Ability to verbalize feelings will improve, Ability to disclose and discuss suicidal ideas, Ability to demonstrate self-control will improve, Ability to identify and develop effective coping behaviors will improve, Ability to maintain clinical measurements within normal limits will improve, Compliance with prescribed medications will improve, and Ability to identify triggers associated with substance abuse/mental health issues will improve  Physician Treatment Plan for Secondary Diagnosis: Principal Problem:   Undifferentiated schizophrenia (HCC)  Long Term Goal(s): Improvement in symptoms so as ready for discharge  Short Term Goals: Ability to identify changes in lifestyle to reduce recurrence of condition will improve, Ability to verbalize feelings will improve, Ability to disclose and discuss suicidal ideas, Ability to demonstrate self-control will improve, Ability to identify and develop effective coping behaviors will improve, Ability to maintain clinical measurements within normal limits will improve, Compliance with prescribed medications will improve, and Ability to identify triggers associated with substance abuse/mental health issues will improve  I certify that inpatient services furnished can reasonably be expected to improve the  patient's condition.    Nanine Means, NP 11/9/202410:04 AM

## 2023-06-17 NOTE — Plan of Care (Signed)
D: Pt alert and oriented. Pt experiences anxiety and poor sleep. Pt denies experiencing any pain at this time. Pt denies experiencing any SI/HI at this time, however endorses AVH. Pt states he hears voices telling him to kill himself and see dead relatives at night.   A: Scheduled medications administered to pt, per MD orders. Support and encouragement provided. Frequent verbal contact made. Routine safety checks conducted q15 minutes.   R: No adverse drug reactions noted. Pt verbally contracts for safety at this time. Pt compliant with medications and treatment plan. Pt interacts minimally with others on the unit, mostly self isolating to room. Pt remains safe at this time. Plan of care ongoing.  Problem: Activity: Goal: Interest or engagement in activities will improve Outcome: Not Progressing Goal: Sleeping patterns will improve Outcome: Not Progressing

## 2023-06-17 NOTE — Progress Notes (Signed)
   06/17/23 2300  Psych Admission Type (Psych Patients Only)  Admission Status Involuntary  Psychosocial Assessment  Patient Complaints Sleep disturbance;Anxiety  Eye Contact Fair  Facial Expression Anxious  Affect Anxious  Speech Logical/coherent  Interaction Assertive  Motor Activity Slow  Appearance/Hygiene In scrubs  Behavior Characteristics Cooperative  Mood Anxious  Thought Process  Coherency WDL  Content WDL  Delusions None reported or observed  Perception Hallucinations  Hallucination Auditory;Visual  Judgment WDL  Confusion None  Danger to Self  Current suicidal ideation? Denies  Self-Injurious Behavior No self-injurious ideation or behavior indicators observed or expressed   Agreement Not to Harm Self Yes  Description of Agreement verbal  Danger to Others  Danger to Others None reported or observed

## 2023-06-17 NOTE — Plan of Care (Signed)
  Problem: Education: Goal: Emotional status will improve Outcome: Progressing Goal: Mental status will improve Outcome: Progressing  Patient endorses Pt denies SI/HI/AH endorses VH of dead people. Has been visible in Milieu this shift. Scheduled medications administered per Provider order. Support and encouragement provided. Routine safety checks conducted every 15 minutes. Patient notified to inform staff with problems or concerns.  No adverse drug reactions noted. Patient contracts for safety at this time.

## 2023-06-17 NOTE — BHH Counselor (Signed)
Adult Comprehensive Assessment  Patient ID: [name], male   DOB: [birth year], 62 y.o.   MRN: [id number]  Information Source: Information source: Patient  Current Stressors:  Patient states their primary concerns and needs for treatment are:: Pt. stated he hasn't slept for 5 days and had begun having auditory and visual hallucinations. Patient states their goals for this hospitilization and ongoing recovery are:: Pt. wants to have his medications adjusted and stabilized so he can sleep and stop hallucinating. Educational / Learning stressors: None Employment / Job issues: None Family Relationships: The pt. reported family members near by in [place], [place] - Sister, Brother, and his daughter. Financial / Lack of resources (include bankruptcy): None Housing / Lack of housing: Lives in a group home and has for the past 3 years Physical health (include injuries & life threatening diseases): DX's w/ Lung cancer November 2023, has since undergone radiation and chemo treatments. Social relationships: Has friends in his group home Substance abuse: Does not use currently, previously did. Bereavement / Loss: Has lost 5 uncles to old age, 1 per year for the last 5 years/  Living/Environment/Situation:  Living Arrangements: Group Home Living conditions (as described by patient or guardian): Good - safe Who else lives in the home?: 5 other men How long has patient lived in current situation?: Three years What is atmosphere in current home: Supportive, Comfortable  Family History:  Marital status: Divorced Divorced, when?: 10 years ago What types of issues is patient dealing with in the relationship?: None, his ex-wife has since died Additional relationship information: None Are you sexually active?: No What is your sexual orientation?: Heterosexual Has your sexual activity been affected by drugs, alcohol, medication, or emotional stress?: No Does patient have children?: Yes How many children?:  3 (2 boys, 1 girl all grown adults) How is patient's relationship with their children?: Pt. reported a good relationship, states they talk on the phone a couple of times per week.  Childhood History:  By whom was/is the patient raised?: Mother/father and step-parent Additional childhood history information: Biological father died when [name] was a baby, he was raised by his step-father. Description of patient's relationship with caregiver when they were a child: Pt. reported his relationship with his parents was good. Patient's description of current relationship with people who raised him/her: Both parents are currently deceased. How were you disciplined when you got in trouble as a child/adolescent?: Pt. reported a "switch" was used for discipline. Does patient have siblings?: Yes Number of Siblings: 6 Description of patient's current relationship with siblings: Pt. reported a good relationship with his siblings. Did patient suffer any verbal/emotional/physical/sexual abuse as a child?: No Did patient suffer from severe childhood neglect?: No Has patient ever been sexually abused/assaulted/raped as an adolescent or adult?: No Was the patient ever a victim of a crime or a disaster?: No Witnessed domestic violence?: No Has patient been affected by domestic violence as an adult?: No  Education:  Highest grade of school patient has completed: 10th Currently a student?: No Learning disability?: Yes What learning problems does patient have?: Pt. struggled with reading and had special classes  Employment/Work Situation:   Employment Situation: On disability Why is Patient on Disability: For mental health diagnosis How Long has Patient Been on Disability: Unknown Patient's Job has Been Impacted by Current Illness: Yes Describe how Patient's Job has Been Impacted: Unable to work What is the Longest Time Patient has Held a Job?: 10 years Where was the Patient Employed at that Time?:  [employer] Has Patient ever Been in the [employer]?: No  Financial Resources:   Financial resources: Insurance claims handler Does patient have a Lawyer or guardian?: No  Alcohol/Substance Abuse:   What has been your use of drugs/alcohol within the last 12 months?: None If attempted suicide, did drugs/alcohol play a role in this?: No Alcohol/Substance Abuse Treatment Hx: Past Tx, Inpatient If yes, describe treatment: [facility] Has alcohol/substance abuse ever caused legal problems?: Yes (Was charged and found guilty of Assault w/ a deadly weapon intent to kill in 1988 and sentanced to 1 year in prison.)  Social Support System:   Patient's Community Support System: Good Describe Community Support System: Family not too far away and friends at his group home Type of faith/religion: [place] How does patient's faith help to cope with current illness?: By giving comfort  Leisure/Recreation:   Do You Have Hobbies?: Yes Leisure and Hobbies: playing basketball  Strengths/Needs:   What is the patient's perception of their strengths?: I like cooking and caring for myself and others Patient states they can use these personal strengths during their treatment to contribute to their recovery: Caring for self - motivated Patient states these barriers may affect/interfere with their treatment: None Patient states these barriers may affect their return to the community: None - just need medications re-balanced Other important information patient would like considered in planning for their treatment: N/A  Discharge Plan:   Currently receiving community mental health services: No Patient states concerns and preferences for aftercare planning are: None Patient states they will know when they are safe and ready for discharge when: When I can sleep and stop having auditory and visual hallucinations. Does patient have access to transportation?: Yes (Group home manager) Does patient have  financial barriers related to discharge medications?: No Patient description of barriers related to discharge medications: None, they will be delivered by [facility] Will patient be returning to same living situation after discharge?: Yes  Summary/Recommendations:   Summary and Recommendations (to be completed by the evaluator): Mika Rajaram is a 62 year old, [place] speaking, African American male from [place], [place] [facility] [place]) with medical history significant of COPD Gold stage II in 2015, HTN, HLD, anxiety/depression, left upper lung mass presented with multiple complaints including persistent insomnia, cough, wheezing. Mr. Quigley reported he has not slept for 5 days and that he hears voices that tell him to kill himself and see's deceased family members, he denied any suicidal or homicidal urges, thoughts or plans and describes moderate depression and mild anxiety/worry about his lung cancer diagnosis. Mr. Vowles reported he has been compliant taking his prescribed psychotropic medications and was concerned recent chemo and radiation may have caused an imbalance in his meds. He wants the Dr's to balance his medications and he looks forward to returning to his Group Home. Additional recommendations include crisis stabilization, therapeutic milieu, encourage group attendance and participation, medication management for mood stabilization, and development of a comprehensive mental wellness plan.  Azucena Kuba. 06/17/2023

## 2023-06-17 NOTE — Group Note (Unsigned)
Date:  06/17/2023 Time:  6:22 PM  Group Topic/Focus:  Healthy coping skills     Participation Level:  {BHH PARTICIPATION GEXBM:84132}  Participation Quality:  {BHH PARTICIPATION QUALITY:22265}  Affect:  {BHH AFFECT:22266}  Cognitive:  {BHH COGNITIVE:22267}  Insight: {BHH Insight2:20797}  Engagement in Group:  {BHH ENGAGEMENT IN GMWNU:27253}  Modes of Intervention:  {BHH MODES OF INTERVENTION:22269}  Additional Comments:  ***  DEION NORQUIST 06/17/2023, 6:22 PM

## 2023-06-17 NOTE — Group Note (Signed)
LCSW Group Therapy Note   Group Date: 06/17/2023 Start Time: 1300 End Time: 1340   Type of Therapy and Topic:  Group Therapy: Mindfulness Monitoring  Participation Level:  Active   Summary of Patient Progress:    Pt. Attended group and used "[place] beach at [facility]" as his mental image to practice deep breathing as a mindfulness activity.  Therapeutic Modalities:  Cognitive Behavioral Therapy  Azucena Kuba, LCSWA 06/17/2023  4:36 PM

## 2023-06-18 DIAGNOSIS — F203 Undifferentiated schizophrenia: Secondary | ICD-10-CM | POA: Diagnosis not present

## 2023-06-18 LAB — CULTURE, BLOOD (ROUTINE X 2)
Culture: NO GROWTH
Culture: NO GROWTH
Special Requests: ADEQUATE
Special Requests: ADEQUATE

## 2023-06-18 MED ORDER — FLUOXETINE HCL 20 MG PO CAPS
20.0000 mg | ORAL_CAPSULE | Freq: Every day | ORAL | Status: DC
  Administered 2023-06-18 – 2023-07-04 (×17): 20 mg via ORAL
  Filled 2023-06-18 (×17): qty 1

## 2023-06-18 MED ORDER — MIRTAZAPINE 15 MG PO TABS
7.5000 mg | ORAL_TABLET | Freq: Every day | ORAL | Status: DC
  Administered 2023-06-18 – 2023-07-03 (×16): 7.5 mg via ORAL
  Filled 2023-06-18 (×16): qty 1

## 2023-06-18 NOTE — Progress Notes (Addendum)
Pt is alert and oriented. Affect bright/mood congruent. Pt denies anxiety/depression  . Also SI/HI/AVH, No pain at this time.   Scheduled meds administered per MD orders . Support and encouragement provided . Routine safety checks conducted every 15 minutes . Pt informed to notify staff with problems and concerns and pt verbalized understanding.   No adverse drug reactions noted. Pt complaint with medications and treatment plan. Pt is receptive, calm , cooperative and interacts well with others on the unit. Pt contracts for safety and remains safe on the unit at this time.   Marland Kitchen

## 2023-06-18 NOTE — Progress Notes (Signed)
[facility] MD Progress Note  06/18/2023 12:54 PM [name]  MRN:  [id number] Subjective: [name] is seen on rounds.  He was recently transferred from medicine for exacerbation of his lung cancer.  His TSH came back a little bit low but his heart rate is up and down so I think just repeating the TSH is best in starting new medication.  In the meantime, he is depressed and does have some trouble sleeping.  I talked to him about Prozac.  I also think I will lower his Remeron.  He is on prednisone.  Lipid panel and hemoglobin A1c came back within normal limits. Principal Problem: Undifferentiated schizophrenia (HCC) Diagnosis: Principal Problem:   Undifferentiated schizophrenia (HCC)  Total Time spent with patient: 15 minutes  Past Psychiatric History: Schizophrenia  Past Medical History:  Past Medical History:  Diagnosis Date   Acute on chronic respiratory failure with hypoxia (HCC)    Asthma    Coagulation disorder (HCC)    COPD (chronic obstructive pulmonary disease) (HCC)    Depression    History of hiatal hernia    Hypertension    Hypokalemia    Leukocytosis    Lung mass    Pneumonia    Schizophrenia (HCC)    Shortness of breath dyspnea    Stroke [facility])    Umbilical hernia    Ventral hernia     Past Surgical History:  Procedure Laterality Date   COLONOSCOPY WITH PROPOFOL N/A 09/21/2021   Procedure: COLONOSCOPY WITH PROPOFOL;  Surgeon: Regis Bill, MD;  Location: [facility] ENDOSCOPY;  Service: Endoscopy;  Laterality: N/A;   HERNIA REPAIR     INSERTION OF MESH N/A 12/18/2014   Procedure: INSERTION OF MESH;  Surgeon: Lattie Haw, MD;  Location: [facility] ORS;  Service: General;  Laterality: N/A;   SUPRA-UMBILICAL HERNIA  12/18/2014   Procedure: SUPRA-UMBILICAL HERNIA;  Surgeon: Lattie Haw, MD;  Location: [facility] ORS;  Service: General;;   UMBILICAL HERNIA REPAIR N/A 12/18/2014   Procedure: HERNIA REPAIR UMBILICAL ADULT;  Surgeon: Lattie Haw, MD;  Location: [facility] ORS;   Service: General;  Laterality: N/A;   VIDEO BRONCHOSCOPY WITH ENDOBRONCHIAL ULTRASOUND N/A 03/15/2023   Procedure: VIDEO BRONCHOSCOPY WITH ENDOBRONCHIAL ULTRASOUND;  Surgeon: Vida Rigger, MD;  Location: [facility] ORS;  Service: Thoracic;  Laterality: N/A;   Family History:  Family History  Problem Relation Age of Onset   Asthma Mother    Hypertension Father    Family Psychiatric  History: Unremarkable Social History:  Social History   Substance and Sexual Activity  Alcohol Use Not Currently   Alcohol/week: 75.0 standard drinks of alcohol   Types: 75 Cans of beer per week     Social History   Substance and Sexual Activity  Drug Use Not Currently   Types: Marijuana, "Crack" cocaine   Comment: none in 15 yrs    Social History   Socioeconomic History   Marital status: Widowed    Spouse name: Not on file   Number of children: Not on file   Years of education: Not on file   Highest education level: Not on file  Occupational History   Not on file  Tobacco Use   Smoking status: Every Day    Current packs/day: 0.15    Average packs/day: 0.2 packs/day for 45.0 years (6.8 ttl pk-yrs)    Types: Cigarettes   Smokeless tobacco: Never  Vaping Use   Vaping status: Never Used  Substance and Sexual Activity   Alcohol use: Not  Currently    Alcohol/week: 75.0 standard drinks of alcohol    Types: 75 Cans of beer per week   Drug use: Not Currently    Types: Marijuana, "Crack" cocaine    Comment: none in 15 yrs   Sexual activity: Not Currently  Other Topics Concern   Not on file  Social History Narrative   Not on file   Social Determinants of Health   Financial Resource Strain: Not on file  Food Insecurity: No Food Insecurity (06/16/2023)   Hunger Vital Sign    Worried About Running Out of Food in the Last Year: Never true    Ran Out of Food in the Last Year: Never true  Transportation Needs: No Transportation Needs (06/16/2023)   PRAPARE - Scientist, research (physical sciences) (Medical): No    Lack of Transportation (Non-Medical): No  Physical Activity: Not on file  Stress: Not on file  Social Connections: Not on file   Additional Social History:                         Sleep: Fair  Appetite:  Fair  Current Medications: Current Facility-Administered Medications  Medication Dose Route Frequency Provider Last Rate Last Admin   acetaminophen (TYLENOL) tablet 650 mg  650 mg Oral Q6H PRN Penn, Cicely, NP       alum & mag hydroxide-simeth (MAALOX/MYLANTA) 200-200-20 MG/5ML suspension 30 mL  30 mL Oral Q4H PRN Penn, Cranston Neighbor, NP       aspirin EC tablet 81 mg  81 mg Oral Daily Sarina Ill, DO   81 mg at 06/18/23 0931   clopidogrel (PLAVIX) tablet 75 mg  75 mg Oral Daily Sarina Ill, DO   75 mg at 06/18/23 0931   diltiazem (CARDIZEM CD) 24 hr capsule 240 mg  240 mg Oral Daily Sarina Ill, DO   240 mg at 06/18/23 1610   diphenhydrAMINE (BENADRYL) capsule 50 mg  50 mg Oral TID PRN Mcneil Sober, NP       Or   diphenhydrAMINE (BENADRYL) injection 50 mg  50 mg Intramuscular TID PRN Mcneil Sober, NP       doxycycline (VIBRA-TABS) tablet 100 mg  100 mg Oral Q12H Sarina Ill, DO   100 mg at 06/18/23 0931   famotidine (PEPCID) tablet 20 mg  20 mg Oral Daily Sarina Ill, DO   20 mg at 06/18/23 0931   FLUoxetine (PROZAC) capsule 20 mg  20 mg Oral Daily Sarina Ill, DO       fluticasone furoate-vilanterol (BREO ELLIPTA) 100-25 MCG/ACT 1 puff  1 puff Inhalation Daily Sarina Ill, DO   1 puff at 06/18/23 0934   haloperidol (HALDOL) tablet 5 mg  5 mg Oral TID PRN Mcneil Sober, NP       Or   haloperidol lactate (HALDOL) injection 5 mg  5 mg Intramuscular TID PRN Penn, Cranston Neighbor, NP       ipratropium-albuterol (DUONEB) 0.5-2.5 (3) MG/3ML nebulizer solution 3 mL  3 mL Nebulization TID Sarina Ill, DO   3 mL at 06/18/23 1004   LORazepam (ATIVAN) tablet 2 mg  2 mg Oral  TID PRN Mcneil Sober, NP       Or   LORazepam (ATIVAN) injection 2 mg  2 mg Intramuscular TID PRN Penn, Cicely, NP       magnesium hydroxide (MILK OF MAGNESIA) suspension 30 mL  30 mL Oral Daily PRN Penn,  Cranston Neighbor, NP       mirtazapine (REMERON) tablet 7.5 mg  7.5 mg Oral QHS Demetruis Depaul Edward, DO       montelukast (SINGULAIR) tablet 10 mg  10 mg Oral QHS Sarina Ill, DO   10 mg at 06/17/23 2121   paliperidone (INVEGA) 24 hr tablet 12 mg  12 mg Oral Daily Sarina Ill, DO   12 mg at 06/18/23 0931   pravastatin (PRAVACHOL) tablet 20 mg  20 mg Oral q1800 Sarina Ill, DO   20 mg at 06/17/23 1801   predniSONE (DELTASONE) tablet 40 mg  40 mg Oral Q breakfast Sarina Ill, DO   40 mg at 06/18/23 0932   traZODone (DESYREL) tablet 200 mg  200 mg Oral QHS PRN Charm Rings, NP   200 mg at 06/17/23 2121   umeclidinium bromide (INCRUSE ELLIPTA) 62.5 MCG/ACT 1 puff  1 puff Inhalation Daily Sarina Ill, DO   1 puff at 06/18/23 1610    Lab Results:  Results for orders placed or performed during the hospital encounter of 06/16/23 (from the past 48 hour(s))  TSH     Status: Abnormal   Collection Time: 06/17/23  1:32 PM  Result Value Ref Range   TSH 0.325 (L) 0.350 - 4.500 uIU/mL    Comment: Performed by a 3rd Generation assay with a functional sensitivity of <=0.01 uIU/mL. Performed at [facility], [address] [place], [place] [address]   Lipid panel     Status: None   Collection Time: 06/17/23  1:32 PM  Result Value Ref Range   Cholesterol 172 0 - 200 mg/dL   Triglycerides 409 <811 mg/dL   HDL 78 >91 mg/dL   Total CHOL/HDL Ratio 2.2 RATIO   VLDL 20 0 - 40 mg/dL   LDL Cholesterol 74 0 - 99 mg/dL    Comment:        Total Cholesterol/HDL:CHD Risk Coronary Heart Disease Risk Table                     Men   Women  1/2 Average Risk   3.4   3.3  Average Risk       5.0   4.4  2 X Average Risk   9.6   7.1  3 X Average Risk   23.4   11.0        Use the calculated Patient Ratio above and the CHD Risk Table to determine the patient's CHD Risk.        ATP III CLASSIFICATION (LDL):  <100     mg/dL   Optimal  478-295  mg/dL   Near or Above                    Optimal  130-159  mg/dL   Borderline  621-308  mg/dL   High  >657     mg/dL   Very High Performed at [facility], [address] [place], [place] [address]   Hemoglobin A1c     Status: None   Collection Time: 06/17/23  1:32 PM  Result Value Ref Range   Hgb A1c MFr Bld 5.6 4.8 - 5.6 %    Comment: (NOTE) Pre diabetes:          5.7%-6.4%  Diabetes:              >6.4%  Glycemic control for   <7.0% adults with diabetes    Mean  Plasma Glucose 114.02 mg/dL    Comment: Performed at [facility], [address], [place], [place] [address]    Blood Alcohol level:  Lab Results  Component Value Date   [facility] <10 06/13/2023   ETH <10 03/24/2023    Metabolic Disorder Labs: Lab Results  Component Value Date   HGBA1C 5.6 06/17/2023   MPG 114.02 06/17/2023   MPG 119.76 03/26/2023   No results found for: "PROLACTIN" Lab Results  Component Value Date   CHOL 172 06/17/2023   TRIG 102 06/17/2023   HDL 78 06/17/2023   CHOLHDL 2.2 06/17/2023   VLDL 20 06/17/2023   LDLCALC 74 06/17/2023   LDLCALC 51 03/26/2023    Physical Findings: AIMS:  , ,  ,  ,    CIWA:    COWS:     Musculoskeletal: Strength & Muscle Tone: within normal limits Gait & Station: normal Patient leans: N/A  Psychiatric Specialty Exam:  Presentation  General Appearance:  Appropriate for Environment  Eye Contact: Fair  Speech: Clear and Coherent  Speech Volume: Decreased  Handedness: Right   Mood and Affect  Mood: Anxious; Depressed  Affect: Constricted   Thought Process  Thought Processes: Coherent; Goal Directed; Linear  Descriptions of Associations:Intact  Orientation:Full (Time, Place and Person)  Thought  Content:Logical  History of Schizophrenia/Schizoaffective disorder:Yes  Duration of Psychotic Symptoms:Greater than six months  Hallucinations:No data recorded Ideas of Reference:Paranoia; Other (comment) ("just anxious")  Suicidal Thoughts:No data recorded Homicidal Thoughts:No data recorded  Sensorium  Memory: Immediate Fair  Judgment: Intact  Insight: Present   Executive Functions  Concentration: Fair  Attention Span: Fair  Recall: [employer] of Knowledge: Fair  Language: Fair   Psychomotor Activity  Psychomotor Activity:No data recorded  Assets  Assets: Communication Skills; Desire for Improvement; Housing   Sleep  Sleep:No data recorded    Blood pressure 116/74, pulse 95, temperature 97.8 F (36.6 C), resp. rate 16, height 6\' 1"  (1.854 m), weight 100.2 kg, SpO2 98%. Body mass index is 29.16 kg/m.   Treatment Plan Summary: Daily contact with patient to assess and evaluate symptoms and progress in treatment, Medication management, and Plan decrease Remeron to 7.5 mg at bedtime.  Start Prozac 20 mg/day.  Sarina Ill, DO 06/18/2023, 12:54 PM

## 2023-06-18 NOTE — Progress Notes (Signed)
   06/18/23 0600  15 Minute Checks  Location Bedroom  Visual Appearance Calm  Behavior Sleeping  Sleep (Behavioral Health Patients Only)  Calculate sleep? (Click Yes once per 24 hr at 0600 safety check) Yes  Documented sleep last 24 hours 12

## 2023-06-18 NOTE — Group Note (Signed)
Date:  06/18/2023 Time:  11:04 AM  Group Topic/Focus:  Movement Therapy    Participation Level:  Did Not Attend   [name] 06/18/2023, 11:04 AM

## 2023-06-18 NOTE — Plan of Care (Signed)
Pt denies SI / HI reports auditory hallucinations. Isolative to room, out during meals. Pt is seen by respiratory therapy for breathing treatments. No signs of distress or injury noted. Pt is pleasant and appropriate during assessment. Staff will continue to monitor q15 for safety.    Problem: Education: Goal: Knowledge of [facility] Education information/materials will improve Outcome: Not Progressing Goal: Emotional status will improve Outcome: Not Progressing Goal: Mental status will improve Outcome: Not Progressing

## 2023-06-18 NOTE — Group Note (Signed)
Date:  06/18/2023 Time:  10:49 PM  Group Topic/Focus:  Making Healthy Choices:   The focus of this group is to help patients identify negative/unhealthy choices they were using prior to admission and identify positive/healthier coping strategies to replace them upon discharge.    Participation Level:  Active  Participation Quality:  Appropriate  Affect:  Appropriate  Cognitive:  Appropriate  Insight: Appropriate  Engagement in Group:  Engaged  Modes of Intervention:  Discussion  Additional Comments:    Maeola Harman 06/18/2023, 10:49 PM

## 2023-06-19 ENCOUNTER — Ambulatory Visit: Payer: 59

## 2023-06-19 DIAGNOSIS — F203 Undifferentiated schizophrenia: Secondary | ICD-10-CM | POA: Diagnosis not present

## 2023-06-19 MED ORDER — QUETIAPINE FUMARATE 200 MG PO TABS
400.0000 mg | ORAL_TABLET | Freq: Every day | ORAL | Status: DC
  Administered 2023-06-19 – 2023-06-26 (×8): 400 mg via ORAL
  Filled 2023-06-19 (×8): qty 2

## 2023-06-19 MED ORDER — IPRATROPIUM-ALBUTEROL 0.5-2.5 (3) MG/3ML IN SOLN
3.0000 mL | Freq: Four times a day (QID) | RESPIRATORY_TRACT | Status: DC | PRN
  Administered 2023-06-20: 3 mL via RESPIRATORY_TRACT
  Filled 2023-06-19: qty 3

## 2023-06-19 NOTE — BH IP Treatment Plan (Signed)
Interdisciplinary Treatment and Diagnostic Plan Update  06/19/2023 Time of Session: 10:34 AM  [name] MRN: [id number]  Principal Diagnosis: Undifferentiated schizophrenia (HCC)  Secondary Diagnoses: Principal Problem:   Undifferentiated schizophrenia (HCC)   Current Medications:  Current Facility-Administered Medications  Medication Dose Route Frequency Provider Last Rate Last Admin   acetaminophen (TYLENOL) tablet 650 mg  650 mg Oral Q6H PRN Penn, Cicely, NP       alum & mag hydroxide-simeth (MAALOX/MYLANTA) 200-200-20 MG/5ML suspension 30 mL  30 mL Oral Q4H PRN Penn, Cranston Neighbor, NP       aspirin EC tablet 81 mg  81 mg Oral Daily Sarina Ill, DO   81 mg at 06/18/23 0931   clopidogrel (PLAVIX) tablet 75 mg  75 mg Oral Daily Sarina Ill, DO   75 mg at 06/18/23 0931   diltiazem (CARDIZEM CD) 24 hr capsule 240 mg  240 mg Oral Daily Sarina Ill, DO   240 mg at 06/18/23 5284   diphenhydrAMINE (BENADRYL) capsule 50 mg  50 mg Oral TID PRN Mcneil Sober, NP       Or   diphenhydrAMINE (BENADRYL) injection 50 mg  50 mg Intramuscular TID PRN Mcneil Sober, NP       doxycycline (VIBRA-TABS) tablet 100 mg  100 mg Oral Q12H Sarina Ill, DO   100 mg at 06/18/23 2126   famotidine (PEPCID) tablet 20 mg  20 mg Oral Daily Sarina Ill, DO   20 mg at 06/18/23 0931   FLUoxetine (PROZAC) capsule 20 mg  20 mg Oral Daily Sarina Ill, DO   20 mg at 06/18/23 1345   fluticasone furoate-vilanterol (BREO ELLIPTA) 100-25 MCG/ACT 1 puff  1 puff Inhalation Daily Sarina Ill, DO   1 puff at 06/18/23 0934   haloperidol (HALDOL) tablet 5 mg  5 mg Oral TID PRN Mcneil Sober, NP       Or   haloperidol lactate (HALDOL) injection 5 mg  5 mg Intramuscular TID PRN Mcneil Sober, NP       ipratropium-albuterol (DUONEB) 0.5-2.5 (3) MG/3ML nebulizer solution 3 mL  3 mL Nebulization TID Sarina Ill, DO   3 mL at 06/19/23 0932   LORazepam  (ATIVAN) tablet 2 mg  2 mg Oral TID PRN Mcneil Sober, NP       Or   LORazepam (ATIVAN) injection 2 mg  2 mg Intramuscular TID PRN Penn, Cranston Neighbor, NP       magnesium hydroxide (MILK OF MAGNESIA) suspension 30 mL  30 mL Oral Daily PRN Penn, Cicely, NP       mirtazapine (REMERON) tablet 7.5 mg  7.5 mg Oral QHS Sarina Ill, DO   7.5 mg at 06/18/23 2128   montelukast (SINGULAIR) tablet 10 mg  10 mg Oral QHS Sarina Ill, DO   10 mg at 06/18/23 2126   paliperidone (INVEGA) 24 hr tablet 12 mg  12 mg Oral Daily Sarina Ill, DO   12 mg at 06/18/23 0931   pravastatin (PRAVACHOL) tablet 20 mg  20 mg Oral q1800 Sarina Ill, DO   20 mg at 06/18/23 1700   predniSONE (DELTASONE) tablet 40 mg  40 mg Oral Q breakfast Sarina Ill, DO   40 mg at 06/18/23 0932   traZODone (DESYREL) tablet 200 mg  200 mg Oral QHS PRN Charm Rings, NP   200 mg at 06/18/23 2126   umeclidinium bromide (INCRUSE ELLIPTA) 62.5 MCG/ACT 1 puff  1  puff Inhalation Daily Sarina Ill, DO   1 puff at 06/18/23 0935   PTA Medications: Medications Prior to Admission  Medication Sig Dispense Refill Last Dose   albuterol (VENTOLIN HFA) 108 (90 Base) MCG/ACT inhaler Inhale 2 puffs into the lungs every 4 (four) hours as needed for wheezing or shortness of breath.      aspirin EC 81 MG tablet Take 81 mg by mouth daily. Swallow whole.      clopidogrel (PLAVIX) 75 MG tablet Take 1 tablet (75 mg total) by mouth daily. 30 tablet 3    dextromethorphan-guaiFENesin (ROBITUSSIN-DM) 10-100 MG/5ML liquid Take 10 mLs by mouth every 6 (six) hours as needed for cough.      diltiazem (CARDIZEM CD) 240 MG 24 hr capsule Take 1 capsule (240 mg total) by mouth daily. 30 capsule 3    docusate sodium (COLACE) 100 MG capsule Take 100 mg by mouth 2 (two) times daily as needed for mild constipation.      doxycycline (VIBRA-TABS) 100 MG tablet Take 1 tablet (100 mg total) by mouth every 12 (twelve) hours  for 3 days.      famotidine (PEPCID) 20 MG tablet Take 1 tablet (20 mg total) by mouth daily. 30 tablet 0    Fluticasone-Umeclidin-Vilant (TRELEGY ELLIPTA) 100-62.5-25 MCG/ACT AEPB Inhale 1 puff into the lungs daily. 28 each 1    guaiFENesin (MUCINEX) 600 MG 12 hr tablet Take 600 mg by mouth 2 (two) times daily as needed.      ipratropium-albuterol (DUONEB) 0.5-2.5 (3) MG/3ML SOLN Take 3 mLs by nebulization 3 (three) times daily. 270 mL 0    lovastatin (MEVACOR) 20 MG tablet Take 20 mg by mouth at bedtime.      mirtazapine (REMERON) 30 MG tablet Take 1 tablet (30 mg total) by mouth at bedtime. 30 tablet 3    montelukast (SINGULAIR) 10 MG tablet Take 1 tablet (10 mg total) by mouth at bedtime. 30 tablet 1    paliperidone (INVEGA) 9 MG 24 hr tablet Take 1 tablet (9 mg total) by mouth at bedtime. 30 tablet 3    predniSONE (DELTASONE) 20 MG tablet Take 2 tablets (40 mg total) by mouth daily with breakfast for 3 days.      prochlorperazine (COMPAZINE) 10 MG tablet Take 1 tablet (10 mg total) by mouth every 6 (six) hours as needed for nausea or vomiting. 90 tablet 0    traZODone (DESYREL) 150 MG tablet Take 1 tablet (150 mg total) by mouth at bedtime as needed for sleep. 30 tablet 3     Patient Stressors: Health problems   Other: AVH experiences    Patient Strengths: Communication skills  Supportive family/friends   Treatment Modalities: Medication Management, Group therapy, Case management,  1 to 1 session with clinician, Psychoeducation, Recreational therapy.   Physician Treatment Plan for Primary Diagnosis: Undifferentiated schizophrenia (HCC) Long Term Goal(s): Improvement in symptoms so as ready for discharge   Short Term Goals: Ability to identify changes in lifestyle to reduce recurrence of condition will improve Ability to verbalize feelings will improve Ability to disclose and discuss suicidal ideas Ability to demonstrate self-control will improve Ability to identify and develop  effective coping behaviors will improve Ability to maintain clinical measurements within normal limits will improve Compliance with prescribed medications will improve Ability to identify triggers associated with substance abuse/mental health issues will improve  Medication Management: Evaluate patient's response, side effects, and tolerance of medication regimen.  Therapeutic Interventions: 1 to 1 sessions, Unit Group  sessions and Medication administration.  Evaluation of Outcomes: Progressing  Physician Treatment Plan for Secondary Diagnosis: Principal Problem:   Undifferentiated schizophrenia (HCC)  Long Term Goal(s): Improvement in symptoms so as ready for discharge   Short Term Goals: Ability to identify changes in lifestyle to reduce recurrence of condition will improve Ability to verbalize feelings will improve Ability to disclose and discuss suicidal ideas Ability to demonstrate self-control will improve Ability to identify and develop effective coping behaviors will improve Ability to maintain clinical measurements within normal limits will improve Compliance with prescribed medications will improve Ability to identify triggers associated with substance abuse/mental health issues will improve     Medication Management: Evaluate patient's response, side effects, and tolerance of medication regimen.  Therapeutic Interventions: 1 to 1 sessions, Unit Group sessions and Medication administration.  Evaluation of Outcomes: Progressing   RN Treatment Plan for Primary Diagnosis: Undifferentiated schizophrenia (HCC) Long Term Goal(s): Knowledge of disease and therapeutic regimen to maintain health will improve  Short Term Goals: Ability to remain free from injury will improve, Ability to verbalize frustration and anger appropriately will improve, Ability to demonstrate self-control, Ability to participate in decision making will improve, Ability to verbalize feelings will improve,  Ability to disclose and discuss suicidal ideas, Ability to identify and develop effective coping behaviors will improve, and Compliance with prescribed medications will improve  Medication Management: RN will administer medications as ordered by provider, will assess and evaluate patient's response and provide education to patient for prescribed medication. RN will report any adverse and/or side effects to prescribing provider.  Therapeutic Interventions: 1 on 1 counseling sessions, Psychoeducation, Medication administration, Evaluate responses to treatment, Monitor vital signs and CBGs as ordered, Perform/monitor CIWA, COWS, AIMS and Fall Risk screenings as ordered, Perform wound care treatments as ordered.  Evaluation of Outcomes: Progressing   LCSW Treatment Plan for Primary Diagnosis: Undifferentiated schizophrenia (HCC) Long Term Goal(s): Safe transition to appropriate next level of care at discharge, Engage patient in therapeutic group addressing interpersonal concerns.  Short Term Goals: Engage patient in aftercare planning with referrals and resources, Increase social support, Increase ability to appropriately verbalize feelings, Increase emotional regulation, Facilitate acceptance of mental health diagnosis and concerns, Facilitate patient progression through stages of change regarding substance use diagnoses and concerns, Identify triggers associated with mental health/substance abuse issues, and Increase skills for wellness and recovery  Therapeutic Interventions: Assess for all discharge needs, 1 to 1 time with Social worker, Explore available resources and support systems, Assess for adequacy in community support network, Educate family and significant other(s) on suicide prevention, Complete Psychosocial Assessment, Interpersonal group therapy.  Evaluation of Outcomes: Progressing   Progress in Treatment: Attending groups: Yes. and No. Participating in groups: Yes. and No. Taking  medication as prescribed: Yes. Toleration medication: Yes. Family/Significant other contact made: No, will contact:  CSW will contact if given permission  Patient understands diagnosis: Yes. Discussing patient identified problems/goals with staff: Yes. Medical problems stabilized or resolved: Yes. Denies suicidal/homicidal ideation: No. Issues/concerns per patient self-inventory: No. Other: None   New problem(s) identified: No, Describe:  None   New Short Term/Long Term Goal(s):  elimination of symptoms of psychosis, medication management for mood stabilization; elimination of SI thoughts; development of comprehensive mental wellness plan.   Patient Goals:  "Get sleep"   Discharge Plan or Barriers: CSW to assist with discharge planning   Reason for Continuation of Hospitalization: Medication stabilization  Estimated Length of Stay: 1 to 7 days   Last 3 [place] Suicide  Severity Risk Score: Flowsheet Row Admission (Current) from 06/16/2023 in [facility] ED to Hosp-Admission (Discharged) from 06/13/2023 in [facility] to Hosp-Admission (Discharged) from 05/25/2023 in [facility] C[facility] PCU  C-SSRS RISK CATEGORY Low Risk No Risk No Risk       Last PHQ 2/9 Scores:    02/03/2023   11:49 AM  Depression screen PHQ 2/9  Decreased Interest 0  Down, Depressed, Hopeless 0  PHQ - 2 Score 0    Scribe for Treatment Team: Elza Rafter, Theresia Majors 06/19/2023 10:51 AM

## 2023-06-19 NOTE — Plan of Care (Signed)
?  Problem: Education: ?Goal: Knowledge of [facility] Education information/materials will improve ?Outcome: Progressing ?Goal: Emotional status will improve ?Outcome: Progressing ?Goal: Mental status will improve ?Outcome: Progressing ?Goal: Verbalization of understanding the information provided will improve ?Outcome: Progressing ?  ?Problem: Activity: ?Goal: Interest or engagement in activities will improve ?Outcome: Progressing ?Goal: Sleeping patterns will improve ?Outcome: Progressing ?  ?Problem: Coping: ?Goal: Ability to verbalize frustrations and anger appropriately will improve ?Outcome: Progressing ?Goal: Ability to demonstrate self-control will improve ?Outcome: Progressing ?  ?Problem: Health Behavior/Discharge Planning: ?Goal: Identification of resources available to assist in meeting health care needs will improve ?Outcome: Progressing ?Goal: Compliance with treatment plan for underlying cause of condition will improve ?Outcome: Progressing ?  ?Problem: Physical Regulation: ?Goal: Ability to maintain clinical measurements within normal limits will improve ?Outcome: Progressing ?  ?Problem: Safety: ?Goal: Periods of time without injury will increase ?Outcome: Progressing ?  ?Problem: Education: ?Goal: Ability to state activities that reduce stress will improve ?Outcome: Progressing ?  ?Problem: Coping: ?Goal: Ability to identify and develop effective coping behavior will improve ?Outcome: Progressing ?  ?Problem: Self-Concept: ?Goal: Ability to identify factors that promote anxiety will improve ?Outcome: Progressing ?Goal: Level of anxiety will decrease ?Outcome: Progressing ?Goal: Ability to modify response to factors that promote anxiety will improve ?Outcome: Progressing ?  ?Problem: Education: ?Goal: Utilization of techniques to improve thought processes will improve ?Outcome: Progressing ?Goal: Knowledge of the prescribed therapeutic regimen will improve ?Outcome: Progressing ?  ?Problem:  Activity: ?Goal: Interest or engagement in leisure activities will improve ?Outcome: Progressing ?Goal: Imbalance in normal sleep/wake cycle will improve ?Outcome: Progressing ?  ?Problem: Coping: ?Goal: Coping ability will improve ?Outcome: Progressing ?Goal: Will verbalize feelings ?Outcome: Progressing ?  ?Problem: Health Behavior/Discharge Planning: ?Goal: Ability to make decisions will improve ?Outcome: Progressing ?Goal: Compliance with therapeutic regimen will improve ?Outcome: Progressing ?  ?Problem: Role Relationship: ?Goal: Will demonstrate positive changes in social behaviors and relationships ?Outcome: Progressing ?  ?Problem: Safety: ?Goal: Ability to disclose and discuss suicidal ideas will improve ?Outcome: Progressing ?Goal: Ability to identify and utilize support systems that promote safety will improve ?Outcome: Progressing ?  ?Problem: Self-Concept: ?Goal: Will verbalize positive feelings about self ?Outcome: Progressing ?Goal: Level of anxiety will decrease ?Outcome: Progressing ?  ?

## 2023-06-19 NOTE — Group Note (Signed)
Date:  06/19/2023 Time:  11:38 AM  Group Topic/Focus:  Emotional Education:   The focus of this group is to discuss what feelings/emotions are, and how they are experienced.    Participation Level:  Did Not Attend   Ardelle Anton 06/19/2023, 11:38 AM

## 2023-06-19 NOTE — Progress Notes (Signed)
[facility] MD Progress Note  06/19/2023 11:54 AM [name]  MRN:  [id number] Subjective: [name] is seen on rounds.  He still complains of depression and not sleeping.  I know that he is on Invega with the intent of an LAI but I think he could benefit from Seroquel since all of his other medications are not working for his sleep.  Trazodone was recently increased and his Remeron recently decreased.  I started him on Prozac a couple days ago.  I am going to discontinue his Invega and try some Seroquel.  He presented to the hospital with shortness of breath and admitted medically with a history of lung cancer and then transferred to psychiatry because of his depression. Principal Problem: Undifferentiated schizophrenia (HCC) Diagnosis: Principal Problem:   Undifferentiated schizophrenia (HCC)  Total Time spent with patient: 15 minutes  Past Psychiatric History: History of schizophrenia and polysubstance abuse  Past Medical History:  Past Medical History:  Diagnosis Date   Acute on chronic respiratory failure with hypoxia (HCC)    Asthma    Coagulation disorder (HCC)    COPD (chronic obstructive pulmonary disease) (HCC)    Depression    History of hiatal hernia    Hypertension    Hypokalemia    Leukocytosis    Lung mass    Pneumonia    Schizophrenia (HCC)    Shortness of breath dyspnea    Stroke [facility])    Umbilical hernia    Ventral hernia     Past Surgical History:  Procedure Laterality Date   COLONOSCOPY WITH PROPOFOL N/A 09/21/2021   Procedure: COLONOSCOPY WITH PROPOFOL;  Surgeon: Regis Bill, MD;  Location: [facility] ENDOSCOPY;  Service: Endoscopy;  Laterality: N/A;   HERNIA REPAIR     INSERTION OF MESH N/A 12/18/2014   Procedure: INSERTION OF MESH;  Surgeon: Lattie Haw, MD;  Location: [facility] ORS;  Service: General;  Laterality: N/A;   SUPRA-UMBILICAL HERNIA  12/18/2014   Procedure: SUPRA-UMBILICAL HERNIA;  Surgeon: Lattie Haw, MD;  Location: [facility] ORS;  Service: General;;    UMBILICAL HERNIA REPAIR N/A 12/18/2014   Procedure: HERNIA REPAIR UMBILICAL ADULT;  Surgeon: Lattie Haw, MD;  Location: [facility] ORS;  Service: General;  Laterality: N/A;   VIDEO BRONCHOSCOPY WITH ENDOBRONCHIAL ULTRASOUND N/A 03/15/2023   Procedure: VIDEO BRONCHOSCOPY WITH ENDOBRONCHIAL ULTRASOUND;  Surgeon: Vida Rigger, MD;  Location: [facility] ORS;  Service: Thoracic;  Laterality: N/A;   Family History:  Family History  Problem Relation Age of Onset   Asthma Mother    Hypertension Father    Family Psychiatric  History: Unremarkable Social History:  Social History   Substance and Sexual Activity  Alcohol Use Not Currently   Alcohol/week: 75.0 standard drinks of alcohol   Types: 75 Cans of beer per week     Social History   Substance and Sexual Activity  Drug Use Not Currently   Types: Marijuana, "Crack" cocaine   Comment: none in 15 yrs    Social History   Socioeconomic History   Marital status: Widowed    Spouse name: Not on file   Number of children: Not on file   Years of education: Not on file   Highest education level: Not on file  Occupational History   Not on file  Tobacco Use   Smoking status: Every Day    Current packs/day: 0.15    Average packs/day: 0.2 packs/day for 45.0 years (6.8 ttl pk-yrs)    Types: Cigarettes   Smokeless tobacco:  Never  Vaping Use   Vaping status: Never Used  Substance and Sexual Activity   Alcohol use: Not Currently    Alcohol/week: 75.0 standard drinks of alcohol    Types: 75 Cans of beer per week   Drug use: Not Currently    Types: Marijuana, "Crack" cocaine    Comment: none in 15 yrs   Sexual activity: Not Currently  Other Topics Concern   Not on file  Social History Narrative   Not on file   Social Determinants of Health   Financial Resource Strain: Not on file  Food Insecurity: No Food Insecurity (06/16/2023)   Hunger Vital Sign    Worried About Running Out of Food in the Last Year: Never true    Ran Out of Food  in the Last Year: Never true  Transportation Needs: No Transportation Needs (06/16/2023)   PRAPARE - Administrator, Civil Service (Medical): No    Lack of Transportation (Non-Medical): No  Physical Activity: Not on file  Stress: Not on file  Social Connections: Not on file   Additional Social History:                         Sleep: Poor  Appetite:  Fair  Current Medications: Current Facility-Administered Medications  Medication Dose Route Frequency Provider Last Rate Last Admin   acetaminophen (TYLENOL) tablet 650 mg  650 mg Oral Q6H PRN Penn, Cicely, NP       alum & mag hydroxide-simeth (MAALOX/MYLANTA) 200-200-20 MG/5ML suspension 30 mL  30 mL Oral Q4H PRN Penn, Cranston Neighbor, NP       aspirin EC tablet 81 mg  81 mg Oral Daily Sarina Ill, DO   81 mg at 06/18/23 0931   clopidogrel (PLAVIX) tablet 75 mg  75 mg Oral Daily Sarina Ill, DO   75 mg at 06/18/23 0931   diltiazem (CARDIZEM CD) 24 hr capsule 240 mg  240 mg Oral Daily Sarina Ill, DO   240 mg at 06/18/23 1610   diphenhydrAMINE (BENADRYL) capsule 50 mg  50 mg Oral TID PRN Mcneil Sober, NP       Or   diphenhydrAMINE (BENADRYL) injection 50 mg  50 mg Intramuscular TID PRN Mcneil Sober, NP       doxycycline (VIBRA-TABS) tablet 100 mg  100 mg Oral Q12H Sarina Ill, DO   100 mg at 06/18/23 2126   famotidine (PEPCID) tablet 20 mg  20 mg Oral Daily Sarina Ill, DO   20 mg at 06/18/23 0931   FLUoxetine (PROZAC) capsule 20 mg  20 mg Oral Daily Sarina Ill, DO   20 mg at 06/18/23 1345   fluticasone furoate-vilanterol (BREO ELLIPTA) 100-25 MCG/ACT 1 puff  1 puff Inhalation Daily Sarina Ill, DO   1 puff at 06/18/23 0934   haloperidol (HALDOL) tablet 5 mg  5 mg Oral TID PRN Mcneil Sober, NP       Or   haloperidol lactate (HALDOL) injection 5 mg  5 mg Intramuscular TID PRN Penn, Cranston Neighbor, NP       ipratropium-albuterol (DUONEB) 0.5-2.5 (3)  MG/3ML nebulizer solution 3 mL  3 mL Nebulization TID Sarina Ill, DO   3 mL at 06/19/23 0932   LORazepam (ATIVAN) tablet 2 mg  2 mg Oral TID PRN Mcneil Sober, NP       Or   LORazepam (ATIVAN) injection 2 mg  2 mg Intramuscular TID PRN Penn,  Cranston Neighbor, NP       magnesium hydroxide (MILK OF MAGNESIA) suspension 30 mL  30 mL Oral Daily PRN Penn, Cranston Neighbor, NP       mirtazapine (REMERON) tablet 7.5 mg  7.5 mg Oral QHS Sarina Ill, DO   7.5 mg at 06/18/23 2128   montelukast (SINGULAIR) tablet 10 mg  10 mg Oral QHS Sarina Ill, DO   10 mg at 06/18/23 2126   paliperidone (INVEGA) 24 hr tablet 12 mg  12 mg Oral Daily Sarina Ill, DO   12 mg at 06/18/23 0931   pravastatin (PRAVACHOL) tablet 20 mg  20 mg Oral q1800 Sarina Ill, DO   20 mg at 06/18/23 1700   predniSONE (DELTASONE) tablet 40 mg  40 mg Oral Q breakfast Sarina Ill, DO   40 mg at 06/18/23 0932   traZODone (DESYREL) tablet 200 mg  200 mg Oral QHS PRN Charm Rings, NP   200 mg at 06/18/23 2126   umeclidinium bromide (INCRUSE ELLIPTA) 62.5 MCG/ACT 1 puff  1 puff Inhalation Daily Sarina Ill, DO   1 puff at 06/18/23 1308    Lab Results:  Results for orders placed or performed during the hospital encounter of 06/16/23 (from the past 48 hour(s))  TSH     Status: Abnormal   Collection Time: 06/17/23  1:32 PM  Result Value Ref Range   TSH 0.325 (L) 0.350 - 4.500 uIU/mL    Comment: Performed by a 3rd Generation assay with a functional sensitivity of <=0.01 uIU/mL. Performed at [facility], [address] [place], [place] [address]   Lipid panel     Status: None   Collection Time: 06/17/23  1:32 PM  Result Value Ref Range   Cholesterol 172 0 - 200 mg/dL   Triglycerides 696 <295 mg/dL   HDL 78 >28 mg/dL   Total CHOL/HDL Ratio 2.2 RATIO   VLDL 20 0 - 40 mg/dL   LDL Cholesterol 74 0 - 99 mg/dL    Comment:        Total Cholesterol/HDL:CHD  Risk Coronary Heart Disease Risk Table                     Men   Women  1/2 Average Risk   3.4   3.3  Average Risk       5.0   4.4  2 X Average Risk   9.6   7.1  3 X Average Risk  23.4   11.0        Use the calculated Patient Ratio above and the CHD Risk Table to determine the patient's CHD Risk.        ATP III CLASSIFICATION (LDL):  <100     mg/dL   Optimal  413-244  mg/dL   Near or Above                    Optimal  130-159  mg/dL   Borderline  010-272  mg/dL   High  >536     mg/dL   Very High Performed at [facility], [address] [place], [place] [address]   Hemoglobin A1c     Status: None   Collection Time: 06/17/23  1:32 PM  Result Value Ref Range   Hgb A1c MFr Bld 5.6 4.8 - 5.6 %    Comment: (NOTE) Pre diabetes:          5.7%-6.4%  Diabetes:              >  6.4%  Glycemic control for   <7.0% adults with diabetes    Mean Plasma Glucose 114.02 mg/dL    Comment: Performed at [facility], [address], [place], [place] [address]    Blood Alcohol level:  Lab Results  Component Value Date   [facility] <10 06/13/2023   ETH <10 03/24/2023    Metabolic Disorder Labs: Lab Results  Component Value Date   HGBA1C 5.6 06/17/2023   MPG 114.02 06/17/2023   MPG 119.76 03/26/2023   No results found for: "PROLACTIN" Lab Results  Component Value Date   CHOL 172 06/17/2023   TRIG 102 06/17/2023   HDL 78 06/17/2023   CHOLHDL 2.2 06/17/2023   VLDL 20 06/17/2023   LDLCALC 74 06/17/2023   LDLCALC 51 03/26/2023    Physical Findings: AIMS:  , ,  ,  ,    CIWA:    COWS:     Musculoskeletal: Strength & Muscle Tone: within normal limits Gait & Station: normal Patient leans: N/A  Psychiatric Specialty Exam:  Presentation  General Appearance:  Appropriate for Environment  Eye Contact: Fair  Speech: Clear and Coherent  Speech Volume: Decreased  Handedness: Right   Mood and Affect  Mood: Anxious;  Depressed  Affect: Constricted   Thought Process  Thought Processes: Coherent; Goal Directed; Linear  Descriptions of Associations:Intact  Orientation:Full (Time, Place and Person)  Thought Content:Logical  History of Schizophrenia/Schizoaffective disorder:Yes  Duration of Psychotic Symptoms:Greater than six months  Hallucinations:No data recorded Ideas of Reference:Paranoia; Other (comment) ("just anxious")  Suicidal Thoughts:No data recorded Homicidal Thoughts:No data recorded  Sensorium  Memory: Immediate Fair  Judgment: Intact  Insight: Present   Executive Functions  Concentration: Fair  Attention Span: Fair  Recall: [employer] of Knowledge: Fair  Language: Fair   Psychomotor Activity  Psychomotor Activity:No data recorded  Assets  Assets: Communication Skills; Desire for Improvement; Housing   Sleep  Sleep:No data recorded  MENTAL STATUS EXAM: Patient is alert and oriented x 3, pleasant and cooperative, good eye contact, speech is normal and not pressured, mood is depressed; affect is flat; thought process: goal directed; thought content: No suicidal ideation; judgment is poor, insight is poor. Blood pressure 106/69, pulse 75, temperature 98.1 F (36.7 C), resp. rate 18, height 6\' 1"  (1.854 m), weight 100.2 kg, SpO2 98%. Body mass index is 29.16 kg/m.   Treatment Plan Summary: Daily contact with patient to assess and evaluate symptoms and progress in treatment, Medication management, and Plan discontinue Invega which was recently increased on consult to 12 mg from 9 mg.  Start Seroquel 400 mg at bedtime.  Sarina Ill, DO 06/19/2023, 11:54 AM

## 2023-06-19 NOTE — Group Note (Signed)
Date:  06/19/2023 Time:  11:13 PM  Group Topic/Focus:  Wellness Toolbox:   The focus of this group is to discuss various aspects of wellness, balancing those aspects and exploring ways to increase the ability to experience wellness.  Patients will create a wellness toolbox for use upon discharge.    Participation Level:  Active  Participation Quality:  Appropriate  Affect:  Appropriate  Cognitive:  Appropriate  Insight: Appropriate  Engagement in Group:  Engaged  Modes of Intervention:  Discussion  Additional Comments:    Maeola Harman 06/19/2023, 11:13 PM

## 2023-06-19 NOTE — Progress Notes (Signed)
   06/19/23 0723  Psych Admission Type (Psych Patients Only)  Admission Status Involuntary  Psychosocial Assessment  Patient Complaints None  Eye Contact Fair  Facial Expression Sullen  Affect Depressed  Speech Logical/coherent  Interaction Assertive  Motor Activity Slow  Appearance/Hygiene In scrubs  Behavior Characteristics Cooperative;Calm  Thought Process  Coherency WDL  Content WDL  Delusions None reported or observed  Perception Hallucinations  Hallucination Auditory  Judgment WDL  Confusion None  Danger to Self  Current suicidal ideation? Denies  Danger to Others  Danger to Others None reported or observed

## 2023-06-19 NOTE — [facility] Suicide Risk Assessment (Signed)
[facility] INPATIENT:  Family/Significant Other Suicide Prevention Education  Suicide Prevention Education:  Education Completed; Franchot Erichsen, [employer] Manager , [phone] has been identified by the patient as the family member/significant other with whom the patient will be residing, and identified as the person(s) who will aid the patient in the event of a mental health crisis (suicidal ideations/suicide attempt).  With written consent from the patient, the family member/significant other has been provided the following suicide prevention education, prior to the and/or following the discharge of the patient.  The suicide prevention education provided includes the following: Suicide risk factors Suicide prevention and interventions National Suicide Hotline telephone number [facility] assessment telephone number [facility] Emergency Assistance 911 [facility] and/or Residential Mobile Crisis Unit telephone number  Request made of family/significant other to: Remove weapons (e.g., guns, rifles, knives), all items previously/currently identified as safety concern.   Remove drugs/medications (over-the-counter, prescriptions, illicit drugs), all items previously/currently identified as a safety concern.  The family member/significant other verbalizes understanding of the suicide prevention education information provided.  The family member/significant other agrees to remove the items of safety concern listed above.  Elza Rafter 06/19/2023, 1:42 PM

## 2023-06-19 NOTE — Progress Notes (Signed)
   06/19/23 2300  Psych Admission Type (Psych Patients Only)  Admission Status Involuntary  Psychosocial Assessment  Patient Complaints None  Eye Contact Fair  Facial Expression Sullen  Affect Depressed  Speech Logical/coherent  Interaction Assertive  Motor Activity Slow  Appearance/Hygiene In scrubs  Behavior Characteristics Cooperative;Calm  Mood Pleasant  Thought Process  Coherency WDL  Content WDL  Delusions None reported or observed  Perception Hallucinations  Hallucination Auditory  Judgment WDL  Confusion None  Danger to Self  Current suicidal ideation? Denies  Agreement Not to Harm Self Yes  Description of Agreement verbal  Danger to Others  Danger to Others None reported or observed

## 2023-06-20 ENCOUNTER — Ambulatory Visit: Payer: 59

## 2023-06-20 DIAGNOSIS — F419 Anxiety disorder, unspecified: Secondary | ICD-10-CM

## 2023-06-20 DIAGNOSIS — E785 Hyperlipidemia, unspecified: Secondary | ICD-10-CM

## 2023-06-20 DIAGNOSIS — I10 Essential (primary) hypertension: Secondary | ICD-10-CM

## 2023-06-20 DIAGNOSIS — F32A Depression, unspecified: Secondary | ICD-10-CM

## 2023-06-20 MED ORDER — IPRATROPIUM-ALBUTEROL 0.5-2.5 (3) MG/3ML IN SOLN
3.0000 mL | Freq: Four times a day (QID) | RESPIRATORY_TRACT | Status: DC
  Administered 2023-06-20 – 2023-06-22 (×7): 3 mL via RESPIRATORY_TRACT
  Filled 2023-06-20 (×7): qty 3

## 2023-06-20 MED ORDER — ALBUTEROL SULFATE HFA 108 (90 BASE) MCG/ACT IN AERS
2.0000 | INHALATION_SPRAY | RESPIRATORY_TRACT | Status: DC
  Administered 2023-06-20: 2 via RESPIRATORY_TRACT
  Filled 2023-06-20: qty 6.7

## 2023-06-20 MED ORDER — HYDROCOD POLI-CHLORPHE POLI ER 10-8 MG/5ML PO SUER: 5.0000 mL | Freq: Two times a day (BID) | ORAL | Status: DC | PRN

## 2023-06-20 MED ORDER — METHYLPREDNISOLONE SODIUM SUCC 125 MG IJ SOLR
125.0000 mg | Freq: Once | INTRAMUSCULAR | Status: AC
  Administered 2023-06-20: 125 mg via INTRAMUSCULAR
  Filled 2023-06-20 (×2): qty 2

## 2023-06-20 MED ORDER — GUAIFENESIN ER 600 MG PO TB12
600.0000 mg | ORAL_TABLET | Freq: Two times a day (BID) | ORAL | Status: DC
  Administered 2023-06-20 – 2023-07-04 (×28): 600 mg via ORAL
  Filled 2023-06-20 (×28): qty 1

## 2023-06-20 MED ORDER — METHYLPREDNISOLONE SODIUM SUCC 40 MG IJ SOLR
40.0000 mg | Freq: Two times a day (BID) | INTRAMUSCULAR | Status: DC
  Administered 2023-06-21: 40 mg via INTRAMUSCULAR
  Filled 2023-06-20 (×2): qty 1

## 2023-06-20 MED ORDER — CEFDINIR 300 MG PO CAPS
300.0000 mg | ORAL_CAPSULE | Freq: Two times a day (BID) | ORAL | Status: DC
Start: 2023-06-20 — End: 2023-06-21
  Administered 2023-06-20 – 2023-06-21 (×2): 300 mg via ORAL
  Filled 2023-06-20 (×2): qty 1

## 2023-06-20 NOTE — Assessment & Plan Note (Signed)
-   We will continue Statin therapy 

## 2023-06-20 NOTE — Plan of Care (Signed)
Patient only comes out of room for meals. He does not attend group. States he is here to sleep.   Problem: Education: Goal: Knowledge of [place] [facility] Education information/materials will improve Outcome: Not Progressing Goal: Emotional status will improve Outcome: Not Progressing Goal: Mental status will improve Outcome: Not Progressing Goal: Verbalization of understanding the information provided will improve Outcome: Not Progressing   Problem: Activity: Goal: Interest or engagement in activities will improve Outcome: Not Progressing Goal: Sleeping patterns will improve Outcome: Not Progressing   Problem: Coping: Goal: Ability to verbalize frustrations and anger appropriately will improve Outcome: Not Progressing Goal: Ability to demonstrate self-control will improve Outcome: Not Progressing   Problem: Health Behavior/Discharge Planning: Goal: Identification of resources available to assist in meeting health care needs will improve Outcome: Not Progressing Goal: Compliance with treatment plan for underlying cause of condition will improve Outcome: Not Progressing   Problem: Physical Regulation: Goal: Ability to maintain clinical measurements within normal limits will improve Outcome: Not Progressing   Problem: Safety: Goal: Periods of time without injury will increase Outcome: Not Progressing   Problem: Education: Goal: Ability to state activities that reduce stress will improve Outcome: Not Progressing   Problem: Coping: Goal: Ability to identify and develop effective coping behavior will improve Outcome: Not Progressing   Problem: Self-Concept: Goal: Ability to identify factors that promote anxiety will improve Outcome: Not Progressing Goal: Level of anxiety will decrease Outcome: Not Progressing Goal: Ability to modify response to factors that promote anxiety will improve Outcome: Not Progressing   Problem: Education: Goal: Utilization of techniques  to improve thought processes will improve Outcome: Not Progressing Goal: Knowledge of the prescribed therapeutic regimen will improve Outcome: Not Progressing   Problem: Activity: Goal: Interest or engagement in leisure activities will improve Outcome: Not Progressing Goal: Imbalance in normal sleep/wake cycle will improve Outcome: Not Progressing   Problem: Coping: Goal: Coping ability will improve Outcome: Not Progressing Goal: Will verbalize feelings Outcome: Not Progressing   Problem: Health Behavior/Discharge Planning: Goal: Ability to make decisions will improve Outcome: Not Progressing Goal: Compliance with therapeutic regimen will improve Outcome: Not Progressing   Problem: Role Relationship: Goal: Will demonstrate positive changes in social behaviors and relationships Outcome: Not Progressing   Problem: Safety: Goal: Ability to disclose and discuss suicidal ideas will improve Outcome: Not Progressing Goal: Ability to identify and utilize support systems that promote safety will improve Outcome: Not Progressing   Problem: Self-Concept: Goal: Will verbalize positive feelings about self Outcome: Not Progressing Goal: Level of anxiety will decrease Outcome: Not Progressing

## 2023-06-20 NOTE — Progress Notes (Signed)
[facility] MD Progress Note  06/20/2023  [name]  MRN:  [id number]  Pt reports presenting to [facility] with police for "hearing voices, haven't slept in 4 days". Reports poor sleep and appetite. Reports worsening auditory hallucinations for the past 2 days. Auditory hallucinations are command in nature and tell him to "kill myself, to cut my throat", to "hurt people in the house, jump on them".   Subjective: Chart reviewed, case discussed in multidisciplinary meeting today, patient seen during rounds.  Patient reports he is doing fine today.  No new acute events overnight.  Patient  denies thoughts of harming himself or others.  He said that his sleep has improved.  Today he denies auditory visual hallucinations. Later in the day RN called to inform that patient was having sharp less of breath.  Patient was given nebulizer treatment.  Hospitalist was consulted to follow-up on exacerbation of COPD.  Dr. Arville Care from hospitalist group evaluated the patient and made some changes to patient's treatment for COPD exacerbation.  Please refer to Dr. Chrys Racer note for details.  Principal Problem: Undifferentiated schizophrenia (HCC) Diagnosis: Principal Problem:   Undifferentiated schizophrenia (HCC)    Past Psychiatric History: Schizophrenia   Past Medical History:  Past Medical History:  Diagnosis Date   Acute on chronic respiratory failure with hypoxia (HCC)    Asthma    Coagulation disorder (HCC)    COPD (chronic obstructive pulmonary disease) (HCC)    Depression    History of hiatal hernia    Hypertension    Hypokalemia    Leukocytosis    Lung mass    Pneumonia    Schizophrenia (HCC)    Shortness of breath dyspnea    Stroke [facility])    Umbilical hernia    Ventral hernia     Past Surgical History:  Procedure Laterality Date   COLONOSCOPY WITH PROPOFOL N/A 09/21/2021   Procedure: COLONOSCOPY WITH PROPOFOL;  Surgeon: Regis Bill, MD;  Location: [facility] ENDOSCOPY;  Service: Endoscopy;   Laterality: N/A;   HERNIA REPAIR     INSERTION OF MESH N/A 12/18/2014   Procedure: INSERTION OF MESH;  Surgeon: Lattie Haw, MD;  Location: [facility] ORS;  Service: General;  Laterality: N/A;   SUPRA-UMBILICAL HERNIA  12/18/2014   Procedure: SUPRA-UMBILICAL HERNIA;  Surgeon: Lattie Haw, MD;  Location: [facility] ORS;  Service: General;;   UMBILICAL HERNIA REPAIR N/A 12/18/2014   Procedure: HERNIA REPAIR UMBILICAL ADULT;  Surgeon: Lattie Haw, MD;  Location: [facility] ORS;  Service: General;  Laterality: N/A;   VIDEO BRONCHOSCOPY WITH ENDOBRONCHIAL ULTRASOUND N/A 03/15/2023   Procedure: VIDEO BRONCHOSCOPY WITH ENDOBRONCHIAL ULTRASOUND;  Surgeon: Vida Rigger, MD;  Location: [facility] ORS;  Service: Thoracic;  Laterality: N/A;   Family History:  Family History  Problem Relation Age of Onset   Asthma Mother    Hypertension Father     Social History:  Social History   Substance and Sexual Activity  Alcohol Use Not Currently   Alcohol/week: 75.0 standard drinks of alcohol   Types: 75 Cans of beer per week     Social History   Substance and Sexual Activity  Drug Use Not Currently   Types: Marijuana, "Crack" cocaine   Comment: none in 15 yrs    Social History   Socioeconomic History   Marital status: Widowed    Spouse name: Not on file   Number of children: Not on file   Years of education: Not on file   Highest education level: Not on  file  Occupational History   Not on file  Tobacco Use   Smoking status: Every Day    Current packs/day: 0.15    Average packs/day: 0.2 packs/day for 45.0 years (6.8 ttl pk-yrs)    Types: Cigarettes   Smokeless tobacco: Never  Vaping Use   Vaping status: Never Used  Substance and Sexual Activity   Alcohol use: Not Currently    Alcohol/week: 75.0 standard drinks of alcohol    Types: 75 Cans of beer per week   Drug use: Not Currently    Types: Marijuana, "Crack" cocaine    Comment: none in 15 yrs   Sexual activity: Not Currently  Other Topics  Concern   Not on file  Social History Narrative   Not on file   Social Determinants of Health   Financial Resource Strain: Not on file  Food Insecurity: No Food Insecurity (06/16/2023)   Hunger Vital Sign    Worried About Running Out of Food in the Last Year: Never true    Ran Out of Food in the Last Year: Never true  Transportation Needs: No Transportation Needs (06/16/2023)   PRAPARE - Administrator, Civil Service (Medical): No    Lack of Transportation (Non-Medical): No  Physical Activity: Not on file  Stress: Not on file  Social Connections: Not on file                          Sleep: Fair  Appetite:  Fair  Current Medications: Current Facility-Administered Medications  Medication Dose Route Frequency Provider Last Rate Last Admin   acetaminophen (TYLENOL) tablet 650 mg  650 mg Oral Q6H PRN Penn, Cicely, NP       alum & mag hydroxide-simeth (MAALOX/MYLANTA) 200-200-20 MG/5ML suspension 30 mL  30 mL Oral Q4H PRN Penn, Cranston Neighbor, NP       aspirin EC tablet 81 mg  81 mg Oral Daily Sarina Ill, DO   81 mg at 06/20/23 1007   clopidogrel (PLAVIX) tablet 75 mg  75 mg Oral Daily Sarina Ill, DO   75 mg at 06/20/23 1007   diltiazem (CARDIZEM CD) 24 hr capsule 240 mg  240 mg Oral Daily Sarina Ill, DO   240 mg at 06/20/23 1007   diphenhydrAMINE (BENADRYL) capsule 50 mg  50 mg Oral TID PRN Mcneil Sober, NP       Or   diphenhydrAMINE (BENADRYL) injection 50 mg  50 mg Intramuscular TID PRN Mcneil Sober, NP       doxycycline (VIBRA-TABS) tablet 100 mg  100 mg Oral Q12H Sarina Ill, DO   100 mg at 06/20/23 1007   famotidine (PEPCID) tablet 20 mg  20 mg Oral Daily Sarina Ill, DO   20 mg at 06/20/23 1007   FLUoxetine (PROZAC) capsule 20 mg  20 mg Oral Daily Sarina Ill, DO   20 mg at 06/20/23 1007   fluticasone furoate-vilanterol (BREO ELLIPTA) 100-25 MCG/ACT 1 puff  1 puff Inhalation Daily Sarina Ill, DO   1 puff at 06/20/23 1007   haloperidol (HALDOL) tablet 5 mg  5 mg Oral TID PRN Mcneil Sober, NP       Or   haloperidol lactate (HALDOL) injection 5 mg  5 mg Intramuscular TID PRN Penn, Cranston Neighbor, NP       ipratropium-albuterol (DUONEB) 0.5-2.5 (3) MG/3ML nebulizer solution 3 mL  3 mL Nebulization QID Mansy, Vernetta Honey, MD  LORazepam (ATIVAN) tablet 2 mg  2 mg Oral TID PRN Mcneil Sober, NP       Or   LORazepam (ATIVAN) injection 2 mg  2 mg Intramuscular TID PRN Penn, Cranston Neighbor, NP       magnesium hydroxide (MILK OF MAGNESIA) suspension 30 mL  30 mL Oral Daily PRN Penn, Cranston Neighbor, NP       methylPREDNISolone sodium succinate (SOLU-MEDROL) 125 mg/2 mL injection 125 mg  125 mg Intramuscular Once Mansy, Jan A, MD       [START ON 06/21/2023] methylPREDNISolone sodium succinate (SOLU-MEDROL) 40 mg/mL injection 40 mg  40 mg Intramuscular Q12H Mansy, Jan A, MD       mirtazapine (REMERON) tablet 7.5 mg  7.5 mg Oral QHS Sarina Ill, DO   7.5 mg at 06/19/23 2122   montelukast (SINGULAIR) tablet 10 mg  10 mg Oral QHS Sarina Ill, DO   10 mg at 06/19/23 2122   pravastatin (PRAVACHOL) tablet 20 mg  20 mg Oral q1800 Sarina Ill, DO   20 mg at 06/20/23 1625   predniSONE (DELTASONE) tablet 40 mg  40 mg Oral Q breakfast Sarina Ill, DO   40 mg at 06/20/23 1007   QUEtiapine (SEROQUEL) tablet 400 mg  400 mg Oral QHS Sarina Ill, DO   400 mg at 06/19/23 2122   traZODone (DESYREL) tablet 200 mg  200 mg Oral QHS PRN Charm Rings, NP   200 mg at 06/19/23 2123   umeclidinium bromide (INCRUSE ELLIPTA) 62.5 MCG/ACT 1 puff  1 puff Inhalation Daily Sarina Ill, DO   1 puff at 06/20/23 1007    Lab Results: No results found for this or any previous visit (from the past 48 hour(s)).  Blood Alcohol level:  Lab Results  Component Value Date   ETH <10 06/13/2023   ETH <10 03/24/2023    Metabolic Disorder Labs: Lab Results  Component  Value Date   HGBA1C 5.6 06/17/2023   MPG 114.02 06/17/2023   MPG 119.76 03/26/2023   No results found for: "PROLACTIN" Lab Results  Component Value Date   CHOL 172 06/17/2023   TRIG 102 06/17/2023   HDL 78 06/17/2023   CHOLHDL 2.2 06/17/2023   VLDL 20 06/17/2023   LDLCALC 74 06/17/2023   LDLCALC 51 03/26/2023     Musculoskeletal: Strength & Muscle Tone: within normal limits Gait & Station: normal Patient leans: N/A  Psychiatric Specialty Exam:  Presentation  General Appearance:  Appropriate for Environment  Eye Contact: Fair  Speech: Clear and Coherent  Speech Volume: Soft  Handedness: Right   Mood and Affect  Mood: Fine  Affect: Constricted   Thought Process  Thought Processes: Coherent; Goal Directed; Linear  Descriptions of Associations:Intact  Orientation:Full (Time, Place and Person)  Thought Content:Logical  History of Schizophrenia/Schizoaffective disorder:Yes  Duration of Psychotic Symptoms:Greater than six months  Hallucinations:Denies Ideas of Reference:No  Suicidal Thoughts:Denies Homicidal Thoughts:Denies  Sensorium  Memory: Immediate Fair  Judgment: Intact  Insight: Present   Executive Functions  Concentration: Fair  Attention Span: Fair  Fund of Knowledge: Fair  Language: Fair   Psychomotor Activity  Psychomotor Activity:Decreased  Assets  Assets: Manufacturing systems engineer; Desire for Improvement; Housing   Sleep  Sleep:Fair   Physical Exam: Physical Exam Constitutional:      Appearance: Normal appearance.  HENT:     Head: Normocephalic and atraumatic.     Nose: No congestion.  Cardiovascular:     Pulses: Normal pulses.  Skin:  General: Skin is warm.  Neurological:     Mental Status: He is alert and oriented to person, place, and time.    Review of Systems  Constitutional:  Negative for chills and fever.  HENT:  Negative for congestion and hearing loss.   Eyes:  Negative for blurred  vision.  Respiratory:  Positive for shortness of breath.   Cardiovascular:  Negative for chest pain and palpitations.  Gastrointestinal:  Negative for nausea and vomiting.  Neurological:  Negative for dizziness and speech change.   Blood pressure 123/78, pulse 89, temperature 97.9 F (36.6 C), resp. rate 14, height 6\' 1"  (1.854 m), weight 100.2 kg, SpO2 99%. Body mass index is 29.16 kg/m.   Treatment Plan Summary: Daily contact with patient to assess and evaluate symptoms and progress in treatment and Medication management  Hospitalist was consulted today  to evaluate the patient for exacerbation of COPD.  Dr. Chrys Racer help and input is appreciated  Lewanda Rife, MD

## 2023-06-20 NOTE — Consult Note (Signed)
Initial Consultation Note   [place]   PATIENT NAME: [name]    MR#:  [id number]  DATE OF BIRTH:  10-26-1960  DATE OF ADMISSION:  06/16/2023  PRIMARY CARE PHYSICIAN: Emogene Morgan, MD   Patient is in [facility] [place] unit.  REQUESTING/REFERRING PHYSICIAN: Luanne Bras, MD  CHIEF COMPLAINT:  Shortness of breath  HISTORY OF PRESENT ILLNESS:  [name] is a 62 y.o. male with medical history significant for schizophrenia, asthma/COPD, hypertension, dyslipidemia, left upper lung mass, and anxiety, depression and CVA, who presented to the emergency room with acute onset of mild dyspnea with associated wheezing and cough with inability to expectorate.  He denied any fever or chills.  No chest pain or palpitations.  No nausea or vomiting or abdominal pain.  No dysuria, oliguria or hematuria or flank pain.  No headache or dizziness or blurred vision.  Is not on oxygen at baseline and is currently not requiring it.  He was initially admitted for COPD exacerbation and transferred to geropsych unit for evaluation of insomnia with associated auditory hallucinations hearing a male voice at night documents him to kill himself with no intent to follow through with this command.  He also reported to the RN that he saw deceased relatives at night at times he lives in a group home.  I was consulted for medical evaluation and management.  PAST MEDICAL HISTORY:   Past Medical History:  Diagnosis Date   Acute on chronic respiratory failure with hypoxia (HCC)    Asthma    Coagulation disorder (HCC)    COPD (chronic obstructive pulmonary disease) (HCC)    Depression    History of hiatal hernia    Hypertension    Hypokalemia    Leukocytosis    Lung mass    Pneumonia    Schizophrenia (HCC)    Shortness of breath dyspnea    Stroke (HCC)    Umbilical hernia    Ventral hernia     PAST SURGICAL HISTORY:   Past Surgical History:  Procedure Laterality Date   COLONOSCOPY WITH PROPOFOL  N/A 09/21/2021   Procedure: COLONOSCOPY WITH PROPOFOL;  Surgeon: Regis Bill, MD;  Location: [facility] ENDOSCOPY;  Service: Endoscopy;  Laterality: N/A;   HERNIA REPAIR     INSERTION OF MESH N/A 12/18/2014   Procedure: INSERTION OF MESH;  Surgeon: Lattie Haw, MD;  Location: [facility] ORS;  Service: General;  Laterality: N/A;   SUPRA-UMBILICAL HERNIA  12/18/2014   Procedure: SUPRA-UMBILICAL HERNIA;  Surgeon: Lattie Haw, MD;  Location: [facility] ORS;  Service: General;;   UMBILICAL HERNIA REPAIR N/A 12/18/2014   Procedure: HERNIA REPAIR UMBILICAL ADULT;  Surgeon: Lattie Haw, MD;  Location: [facility] ORS;  Service: General;  Laterality: N/A;   VIDEO BRONCHOSCOPY WITH ENDOBRONCHIAL ULTRASOUND N/A 03/15/2023   Procedure: VIDEO BRONCHOSCOPY WITH ENDOBRONCHIAL ULTRASOUND;  Surgeon: Vida Rigger, MD;  Location: [facility] ORS;  Service: Thoracic;  Laterality: N/A;    SOCIAL HISTORY:   Social History   Tobacco Use   Smoking status: Every Day    Current packs/day: 0.15    Average packs/day: 0.2 packs/day for 45.0 years (6.8 ttl pk-yrs)    Types: Cigarettes   Smokeless tobacco: Never  Substance Use Topics   Alcohol use: Not Currently    Alcohol/week: 75.0 standard drinks of alcohol    Types: 75 Cans of beer per week    FAMILY HISTORY:   Family History  Problem Relation Age of Onset   Asthma Mother  Hypertension Father     DRUG ALLERGIES:  No Known Allergies  REVIEW OF SYSTEMS:   ROS As per history of present illness. All pertinent systems were reviewed above. Constitutional, HEENT, cardiovascular, respiratory, GI, GU, musculoskeletal, neuro, psychiatric, endocrine, integumentary and hematologic systems were reviewed and are otherwise negative/unremarkable except for positive findings mentioned above in the HPI.   MEDICATIONS AT HOME:   Prior to Admission medications   Medication Sig Start Date End Date Taking? Authorizing Provider  albuterol (VENTOLIN HFA) 108 (90 Base) MCG/ACT  inhaler Inhale 2 puffs into the lungs every 4 (four) hours as needed for wheezing or shortness of breath.    [provider]  aspirin EC 81 MG tablet Take 81 mg by mouth daily. Swallow whole.    [provider]  clopidogrel (PLAVIX) 75 MG tablet Take 1 tablet (75 mg total) by mouth daily. 04/08/23   Sarina Ill, DO  dextromethorphan-guaiFENesin (ROBITUSSIN-DM) 10-100 MG/5ML liquid Take 10 mLs by mouth every 6 (six) hours as needed for cough.    [provider]  diltiazem (CARDIZEM CD) 240 MG 24 hr capsule Take 1 capsule (240 mg total) by mouth daily. 04/08/23   Sarina Ill, DO  docusate sodium (COLACE) 100 MG capsule Take 100 mg by mouth 2 (two) times daily as needed for mild constipation.    [provider]  famotidine (PEPCID) 20 MG tablet Take 1 tablet (20 mg total) by mouth daily. 12/15/21   Pennie Banter, DO  Fluticasone-Umeclidin-Vilant (TRELEGY ELLIPTA) 100-62.5-25 MCG/ACT AEPB Inhale 1 puff into the lungs daily. 12/14/21   Pennie Banter, DO  guaiFENesin (MUCINEX) 600 MG 12 hr tablet Take 600 mg by mouth 2 (two) times daily as needed.    [provider]  ipratropium-albuterol (DUONEB) 0.5-2.5 (3) MG/3ML SOLN Take 3 mLs by nebulization 3 (three) times daily. 06/03/23 07/03/23  Lucile Shutters, MD  lovastatin (MEVACOR) 20 MG tablet Take 20 mg by mouth at bedtime.    [provider]  mirtazapine (REMERON) 30 MG tablet Take 1 tablet (30 mg total) by mouth at bedtime. 04/07/23   Sarina Ill, DO  montelukast (SINGULAIR) 10 MG tablet Take 1 tablet (10 mg total) by mouth at bedtime. 12/14/21   Pennie Banter, DO  paliperidone (INVEGA) 9 MG 24 hr tablet Take 1 tablet (9 mg total) by mouth at bedtime. 04/07/23   Sarina Ill, DO  prochlorperazine (COMPAZINE) 10 MG tablet Take 1 tablet (10 mg total) by mouth every 6 (six) hours as needed for nausea or vomiting. 04/20/23   [name], MD  traZODone  (DESYREL) 150 MG tablet Take 1 tablet (150 mg total) by mouth at bedtime as needed for sleep. 04/07/23   Sarina Ill, DO      VITAL SIGNS:  Blood pressure 123/78, pulse 89, temperature 97.9 F (36.6 C), resp. rate 14, height 6\' 1"  (1.854 m), weight 100.2 kg, SpO2 99%.  PHYSICAL EXAMINATION:  Physical Exam  GENERAL:  62 y.o.-year-old patient  sitting in bed with no acute distress.  EYES: Pupils equal, round, reactive to light and accommodation. No scleral icterus. Extraocular muscles intact.  HEENT: Head atraumatic, normocephalic. Oropharynx and nasopharynx clear.  NECK:  Supple, no jugular venous distention. No thyroid enlargement, no tenderness.  LUNGS: Diffuse expiratory wheezes with slightly diminished expiratory airflow and mildly harsh vesicular breathing.. No use of accessory muscles of respiration.  CARDIOVASCULAR: Regular rate and rhythm, S1, S2 normal. No murmurs, rubs, or gallops.  ABDOMEN:  Soft, nondistended, nontender. Bowel sounds present. No organomegaly or mass.  EXTREMITIES: No pedal edema, cyanosis, or clubbing.  NEUROLOGIC: Cranial nerves II through XII are intact. Muscle strength 5/5 in all extremities. Sensation intact. Gait not checked.  PSYCHIATRIC: The patient is alert and oriented x 3.  Normal affect and good eye contact. SKIN: No obvious rash, lesion, or ulcer.   LABORATORY PANEL:   CBC Recent Labs  Lab 06/16/23 0513  WBC 5.3  HGB 9.5*  HCT 28.9*  PLT 154   ------------------------------------------------------------------------------------------------------------------  Chemistries  Recent Labs  Lab 06/16/23 0513  NA 137  K 3.8  CL 103  CO2 25  GLUCOSE 100*  BUN 14  CREATININE 0.56*  CALCIUM 9.0   ------------------------------------------------------------------------------------------------------------------  Cardiac Enzymes No results for input(s): "TROPONINI" in the last 168  hours. ------------------------------------------------------------------------------------------------------------------  RADIOLOGY:  No results found.    IMPRESSION AND PLAN:  Assessment and Plan: * COPD with acute exacerbation (HCC) - This is associated with asthma exacerbation. - We will place the patient steroid therapy with Im Solu-Medrol as well as nebulized bronchodilator therapy with duonebs q.i.d. and q.4 hours p.r.n.Marland Kitchen - Mucolytic therapy will be provided with Mucinex and antibiotic therapy with p.o. Omnicef. - We will hold off long-acting beta agonist in Trelegy for now. - We will continue Singulair and Incruse Ellipta. - O2 protocol will be followed.  He currently has no need for O2.   Essential hypertension - We will continue antihypertensive therapy.  Dyslipidemia - We will continue Statin therapy.  Anxiety and depression - The patient is on Remeron, trazodone and as needed IM and p.o. Ativan.  Undifferentiated schizophrenia (HCC) - He is on Haldol and Seroquel.   Thank you Dr. Marval Regal for allowing me to participate in the care of this pleasant gentleman.  We can follow the patient on a as needed basis.  Hannah Beat M.D on 06/20/2023 at 9:26 PM  [facility]   From 7 PM-7 AM, contact night-coverage [email or web address]  CC: Primary care physician; Emogene Morgan, MD

## 2023-06-20 NOTE — Group Note (Signed)
LCSW Group Therapy Note  Group Date: 06/20/2023 Start Time: 1330 End Time: 1415   Type of Therapy and Topic:  Group Therapy - Healthy vs Unhealthy Coping Skills  Participation Level:  Did Not Attend   Description of Group The focus of this group was to determine what unhealthy coping techniques typically are used by group members and what healthy coping techniques would be helpful in coping with various problems. Patients were guided in becoming aware of the differences between healthy and unhealthy coping techniques. Patients were asked to identify 2-3 healthy coping skills they would like to learn to use more effectively.  Therapeutic Goals Patients learned that coping is what human beings do all day long to deal with various situations in their lives Patients defined and discussed healthy vs unhealthy coping techniques Patients identified their preferred coping techniques and identified whether these were healthy or unhealthy Patients determined 2-3 healthy coping skills they would like to become more familiar with and use more often. Patients provided support and ideas to each other   Summary of Patient Progress:X  Therapeutic Modalities Cognitive Behavioral Therapy Motivational Interviewing  Elza Rafter, [place] 06/20/2023  2:34 PM

## 2023-06-20 NOTE — Assessment & Plan Note (Addendum)
-   This is associated with asthma exacerbation. - We will place the patient steroid therapy with Im Solu-Medrol as well as nebulized bronchodilator therapy with duonebs q.i.d. and q.4 hours p.r.n.Marland Kitchen - Mucolytic therapy will be provided with Mucinex and antibiotic therapy with p.o. Omnicef. - We will hold off long-acting beta agonist in Trelegy for now. - We will continue Singulair and Incruse Ellipta. - O2 protocol will be followed.  He currently has no need for O2.

## 2023-06-20 NOTE — Assessment & Plan Note (Signed)
-   The patient is on Remeron, trazodone and as needed IM and p.o. Ativan.

## 2023-06-20 NOTE — Progress Notes (Signed)
   06/20/23 0600  15 Minute Checks  Location Bedroom  Visual Appearance Calm  Behavior Sleeping  Sleep (Behavioral Health Patients Only)  Calculate sleep? (Click Yes once per 24 hr at 0600 safety check) Yes  Documented sleep last 24 hours 8.75

## 2023-06-20 NOTE — Progress Notes (Signed)
Patient at nurses station c/o SOB with auditory wheezes. PRN neb tx given with minimal effect. N/O received to give albuterol inhaler. Will continue to monitor for changes.

## 2023-06-20 NOTE — Progress Notes (Signed)
   06/20/23 0718  Psych Admission Type (Psych Patients Only)  Admission Status Involuntary  Psychosocial Assessment  Eye Contact Fair  Facial Expression Sullen  Affect Depressed  Speech Logical/coherent  Interaction Assertive  Motor Activity Slow  Appearance/Hygiene In scrubs  Behavior Characteristics Cooperative  Mood Pleasant  Thought Process  Coherency WDL  Content WDL  Delusions None reported or observed  Perception Hallucinations  Hallucination Auditory  Judgment WDL  Confusion None  Danger to Self  Current suicidal ideation? Denies  Danger to Others  Danger to Others None reported or observed

## 2023-06-20 NOTE — Group Note (Signed)
Recreation Therapy Group Note   Group Topic:General Recreation  Group Date: 06/20/2023 Start Time: 1410 End Time: 1500 Facilitators: Rosina Lowenstein, LRT, CTRS Location: Courtyard  Group Description: Outdoor Recreation. Patients had the option to play corn hole, ring toss, bowling or listening to music while outside in the courtyard getting fresh air and sunlight. Marland Kitchen LRT and patients discussed things that they enjoy doing in their free time outside of the hospital. LRT encouraged patients to drink water after being active and getting their heart rate up.    Goal Area(s) Addressed: Patient will identify leisure interests.  Patient will practice healthy decision making. Patient will engage in recreation activity.   Affect/Mood: N/A   Participation Level: Did not attend    Clinical Observations/Individualized Feedback: [name] did not attend group.  Plan: Continue to engage patient in RT group sessions 2-3x/week.   Rosina Lowenstein, LRT, CTRS 06/20/2023 3:40 PM

## 2023-06-20 NOTE — Assessment & Plan Note (Signed)
-   He is on Haldol and Seroquel.

## 2023-06-20 NOTE — Assessment & Plan Note (Signed)
-   We will continue anti-hypertensive therapy. 

## 2023-06-21 DIAGNOSIS — J441 Chronic obstructive pulmonary disease with (acute) exacerbation: Secondary | ICD-10-CM

## 2023-06-21 MED ORDER — PREDNISONE 10 MG PO TABS
40.0000 mg | ORAL_TABLET | Freq: Every day | ORAL | Status: AC
  Administered 2023-06-22: 40 mg via ORAL
  Filled 2023-06-21: qty 4

## 2023-06-21 MED ORDER — HYDROXYZINE HCL 10 MG PO TABS
10.0000 mg | ORAL_TABLET | Freq: Four times a day (QID) | ORAL | Status: DC | PRN
  Administered 2023-06-21 – 2023-07-04 (×7): 10 mg via ORAL
  Filled 2023-06-21 (×7): qty 1

## 2023-06-21 NOTE — Group Note (Signed)
Date:  06/21/2023 Time:  11:15 AM  Group Topic/Focus:  Values Group The purpose of this group is for patients to identity their values as to relationships,parenting,marriage,career spiritual and health. Also rating from 0-10 about how important they are to the patients.    Participation Level:  Did Not Attend  Participation Quality:    Affect:    Cognitive:    Insight:   Engagement in Group:    Modes of Intervention:    Additional Comments:  Did not attend  Bacilio Abascal T Gilbert Narain 06/21/2023, 11:15 AM

## 2023-06-21 NOTE — Progress Notes (Signed)
Progress Note    [name]  ZOX:[id number] DOB: May 13, 1961  DOA: 06/16/2023 PCP: Emogene Morgan, MD      Brief Narrative:    Medical records reviewed and are as summarized below:  [name] is a 62 y.o. male e with medical history significant of COPD Gold stage II in 2015, HTN, HLD, anxiety/depression, schizophrenia, left upper lung mass, who was recently discharged from the hospital on 06/16/2023 after hospitalization for COPD exacerbation.  He was discharged to the behavioral health unit because of auditory hallucinations and suicidal thoughts.  He was discharged on prednisone and doxycycline. The hospitalist team was consulted on 06/20/2023 for COPD exacerbation.      Assessment/Plan:   Principal Problem:   COPD with acute exacerbation (HCC) Active Problems:   Essential hypertension   Dyslipidemia   Anxiety and depression   Undifferentiated schizophrenia (HCC)     Body mass index is 29.16 kg/m.   COPD vs asthma exacerbation: Continue prednisone and bronchodilators.  Discontinue IM methylprednisolone.  Discontinue doxycycline and Omnicef.  He has had antibiotics for 6 days.   Hypertension: Continue Cardizem.   History of stroke: Continue aspirin and Plavix   Schizophrenia, anxiety and depression: Follow-up with psychiatrist.   Comorbidities include hyperlipidemia, left upper lung mass   Diet Order             Diet regular Room service appropriate? Yes; Fluid consistency: Thin  Diet effective now                            Consultants: Psychiatrist  Procedures: None    Medications:    aspirin EC  81 mg Oral Daily   clopidogrel  75 mg Oral Daily   diltiazem  240 mg Oral Daily   famotidine  20 mg Oral Daily   FLUoxetine  20 mg Oral Daily   guaiFENesin  600 mg Oral BID   ipratropium-albuterol  3 mL Nebulization QID   mirtazapine  7.5 mg Oral QHS   montelukast  10 mg Oral QHS   pravastatin  20 mg Oral q1800   [START  ON 06/22/2023] predniSONE  40 mg Oral Q breakfast   QUEtiapine  400 mg Oral QHS   umeclidinium bromide  1 puff Inhalation Daily   Continuous Infusions:   Anti-infectives (From admission, onward)    Start     Dose/Rate Route Frequency Ordered Stop   06/20/23 2200  cefdinir (OMNICEF) capsule 300 mg  Status:  Discontinued        300 mg Oral Every 12 hours 06/20/23 2106 06/21/23 1246   06/16/23 2200  doxycycline (VIBRA-TABS) tablet 100 mg  Status:  Discontinued        100 mg Oral Every 12 hours 06/16/23 1744 06/21/23 1246              Family Communication/Anticipated D/C date and plan/Code Status   DVT prophylaxis: Patient is ambulatory     Code Status: Full Code           Subjective:   Interval events noted.  No cough or shortness of breath.  He has a little wheezing but overall, he feels better.  He said he got a shot of steroids this morning.  Objective:    Vitals:   06/20/23 0718 06/20/23 1825 06/20/23 1927 06/21/23 0726  BP: 93/66 116/76 123/78 116/71  Pulse: 87 91 89 85  Resp: 16 20 14  16  Temp: 97.9 F (36.6 C) 98.1 F (36.7 C) 97.9 F (36.6 C) (!) 97.3 F (36.3 C)  TempSrc:      SpO2: 100% 98% 99% 97%  Weight:      Height:       No data found.  No intake or output data in the 24 hours ending 06/21/23 1246 Filed Weights   06/16/23 1538  Weight: 100.2 kg    Exam:  GEN: NAD SKIN: Warm and dry EYES: No pallor or icterus ENT: MMM CV: RRR PULM: CTA B ABD: soft, protuberant, NT, +BS CNS: AAO x 3, non focal EXT: No edema or tenderness PSYCH: Calm and appropriate.  Speech is clear and coherent       Data Reviewed:   I have personally reviewed following labs and imaging studies:  Labs: Labs show the following:   Basic Metabolic Panel: Recent Labs  Lab 06/15/23 0422 06/16/23 0513  NA 138 137  K 4.0 3.8  CL 105 103  CO2 26 25  GLUCOSE 103* 100*  BUN 13 14  CREATININE 0.60* 0.56*  CALCIUM 8.9 9.0   GFR Estimated  Creatinine Clearance: 116.1 mL/min (A) (by C-G formula based on SCr of 0.56 mg/dL (L)). Liver Function Tests: No results for input(s): "AST", "ALT", "ALKPHOS", "BILITOT", "PROT", "ALBUMIN" in the last 168 hours. No results for input(s): "LIPASE", "AMYLASE" in the last 168 hours. No results for input(s): "AMMONIA" in the last 168 hours. Coagulation profile No results for input(s): "INR", "PROTIME" in the last 168 hours.  CBC: Recent Labs  Lab 06/15/23 0422 06/16/23 0513  WBC 6.4 5.3  NEUTROABS 5.2 3.8  HGB 9.5* 9.5*  HCT 27.6* 28.9*  MCV 92.3 95.4  PLT 153 154   Cardiac Enzymes: No results for input(s): "CKTOTAL", "CKMB", "CKMBINDEX", "TROPONINI" in the last 168 hours. BNP (last 3 results) No results for input(s): "PROBNP" in the last 8760 hours. CBG: No results for input(s): "GLUCAP" in the last 168 hours. D-Dimer: No results for input(s): "DDIMER" in the last 72 hours. Hgb A1c: No results for input(s): "HGBA1C" in the last 72 hours. Lipid Profile: No results for input(s): "CHOL", "HDL", "LDLCALC", "TRIG", "CHOLHDL", "LDLDIRECT" in the last 72 hours. Thyroid function studies: No results for input(s): "TSH", "T4TOTAL", "T3FREE", "THYROIDAB" in the last 72 hours.  Invalid input(s): "FREET3" Anemia work up: No results for input(s): "VITAMINB12", "FOLATE", "FERRITIN", "TIBC", "IRON", "RETICCTPCT" in the last 72 hours. Sepsis Labs: Recent Labs  Lab 06/15/23 0422 06/16/23 0513  WBC 6.4 5.3    Microbiology Recent Results (from the past 240 hour(s))  Blood Culture (routine x 2)     Status: None   Collection Time: 06/13/23  1:19 PM   Specimen: BLOOD  Result Value Ref Range Status   Specimen Description BLOOD BLOOD RIGHT ARM  Final   Special Requests   Final    BOTTLES DRAWN AEROBIC AND ANAEROBIC Blood Culture adequate volume   Culture   Final    NO GROWTH 5 DAYS Performed at [facility], [address] [place], [place] [address]    Report Status  06/18/2023 FINAL  Final  Blood Culture (routine x 2)     Status: None   Collection Time: 06/13/23  1:21 PM   Specimen: BLOOD  Result Value Ref Range Status   Specimen Description BLOOD RIGHT ANTECUBITAL  Final   Special Requests   Final    BOTTLES DRAWN AEROBIC AND ANAEROBIC Blood Culture adequate volume   Culture   Final  NO GROWTH 5 DAYS Performed at [facility], [address] [place], [place] [address]    Report Status 06/18/2023 FINAL  Final  Resp panel by RT-PCR (RSV, Flu A&B, Covid) Anterior Nasal Swab     Status: None   Collection Time: 06/13/23  1:23 PM   Specimen: Anterior Nasal Swab  Result Value Ref Range Status   SARS Coronavirus 2 by RT PCR NEGATIVE NEGATIVE Final    Comment: (NOTE) SARS-CoV-2 target nucleic acids are NOT DETECTED.  The SARS-CoV-2 RNA is generally detectable in upper respiratory specimens during the acute phase of infection. The lowest concentration of SARS-CoV-2 viral copies this assay can detect is 138 copies/mL. A negative result does not preclude SARS-Cov-2 infection and should not be used as the sole basis for treatment or other patient management decisions. A negative result may occur with  improper specimen collection/handling, submission of specimen other than nasopharyngeal swab, presence of viral mutation(s) within the areas targeted by this assay, and inadequate number of viral copies(<138 copies/mL). A negative result must be combined with clinical observations, patient history, and epidemiological information. The expected result is Negative.  Fact Sheet for Patients:  BloggerCourse.com  Fact Sheet for Healthcare Providers:  SeriousBroker.it  This test is no t yet approved or cleared by the [place] FDA and  has been authorized for detection and/or diagnosis of SARS-CoV-2 by FDA under an Emergency Use Authorization (EUA). This EUA will remain  in effect (meaning  this test can be used) for the duration of the COVID-19 declaration under Section 564(b)(1) of the Act, 21 U.S.C.section 360bbb-3(b)(1), unless the authorization is terminated  or revoked sooner.       Influenza A by PCR NEGATIVE NEGATIVE Final   Influenza B by PCR NEGATIVE NEGATIVE Final    Comment: (NOTE) The Xpert Xpress SARS-CoV-2/FLU/RSV plus assay is intended as an aid in the diagnosis of influenza from Nasopharyngeal swab specimens and should not be used as a sole basis for treatment. Nasal washings and aspirates are unacceptable for Xpert Xpress SARS-CoV-2/FLU/RSV testing.  Fact Sheet for Patients: BloggerCourse.com  Fact Sheet for Healthcare Providers: SeriousBroker.it  This test is not yet approved or cleared by the [place] FDA and has been authorized for detection and/or diagnosis of SARS-CoV-2 by FDA under an Emergency Use Authorization (EUA). This EUA will remain in effect (meaning this test can be used) for the duration of the COVID-19 declaration under Section 564(b)(1) of the Act, 21 U.S.C. section 360bbb-3(b)(1), unless the authorization is terminated or revoked.     Resp Syncytial Virus by PCR NEGATIVE NEGATIVE Final    Comment: (NOTE) Fact Sheet for Patients: BloggerCourse.com  Fact Sheet for Healthcare Providers: SeriousBroker.it  This test is not yet approved or cleared by the [place] FDA and has been authorized for detection and/or diagnosis of SARS-CoV-2 by FDA under an Emergency Use Authorization (EUA). This EUA will remain in effect (meaning this test can be used) for the duration of the COVID-19 declaration under Section 564(b)(1) of the Act, 21 U.S.C. section 360bbb-3(b)(1), unless the authorization is terminated or revoked.  Performed at [facility], [address] [place], [place] [address]     Procedures and  diagnostic studies:  No results found.             LOS: 5 days   Aleck Locklin  [facility]   Pager on [email or web address]. If 7PM-7AM, please contact night-coverage at [email or web address]     06/21/2023, 12:46 PM

## 2023-06-21 NOTE — Progress Notes (Signed)
Patient came out of his room with c/o sob, and making stridorous breath sounds from across the room.  Patient has copd, lung ca and had received his duoneb tx at 1204pm.  Patients vs168/79, 122, 26, 98%ra.  Respiratory therapy arrived and by that time the patient was calmer, hr 100, resp 20, 100%ra.  Patient had stopped making the stridorous sounds but when RT went to evaluate he started making a noise each time he breathed in until she asked him to stop and he did. Lung sounds were within his normal and airway patent.  Spoke with doctor and patient ordered atarax for anxiety which patient endorses.Will continue to monitor.

## 2023-06-21 NOTE — Progress Notes (Signed)
   06/21/23 2200  Psych Admission Type (Psych Patients Only)  Admission Status Involuntary  Psychosocial Assessment  Patient Complaints None  Eye Contact Fair  Facial Expression Animated  Affect Depressed  Speech Logical/coherent  Interaction Assertive  Motor Activity Slow  Appearance/Hygiene In scrubs  Behavior Characteristics Cooperative  Mood Pleasant  Thought Process  Coherency WDL  Content WDL  Delusions None reported or observed  Perception UTA  Hallucination None reported or observed  Judgment WDL  Confusion None  Danger to Self  Current suicidal ideation? Denies  Agreement Not to Harm Self Yes  Description of Agreement verbal  Danger to Others  Danger to Others None reported or observed

## 2023-06-21 NOTE — Progress Notes (Signed)
   06/20/23 2300  Psych Admission Type (Psych Patients Only)  Admission Status Involuntary  Psychosocial Assessment  Patient Complaints None  Eye Contact Fair  Facial Expression Sullen  Affect Depressed  Speech Logical/coherent  Interaction Assertive  Motor Activity Slow  Appearance/Hygiene In scrubs  Behavior Characteristics Cooperative  Mood Pleasant  Thought Process  Coherency WDL  Content WDL  Delusions None reported or observed  Perception Hallucinations  Hallucination Auditory  Judgment WDL  Confusion None  Danger to Self  Current suicidal ideation? Denies  Agreement Not to Harm Self Yes  Description of Agreement verbal  Danger to Others  Danger to Others None reported or observed

## 2023-06-21 NOTE — Group Note (Signed)
Recreation Therapy Group Note   Group Topic:Health and Wellness  Group Date: 06/21/2023 Start Time: 1400 End Time: 1445 Facilitators: Rosina Lowenstein, LRT, CTRS Location:  Day Room  Group Description: Seated Exercise. LRT discussed the mental and physical benefits of exercise. LRT and group discussed how physical activity can be used as a coping skill. Pt's and LRT followed along to an exercise video on the TV screen that provided a visual representation and audio description of every exercise performed. Pt's encouraged to listen to their bodies and stop at any time if they experience feelings of discomfort or pain. Pts were encouraged to drink water and stay hydrated.   Goal Area(s) Addressed: Patient will learn benefits of physical activity. Patient will identify exercise as a coping skill.  Patient will follow multistep directions. Patient will try a new leisure interest.    Affect/Mood: Appropriate   Participation Level: Active and Engaged   Participation Quality: Independent   Behavior: Appropriate, Calm, and Cooperative   Speech/Thought Process: Coherent   Insight: Fair   Judgement: Fair    Modes of Intervention: Activity   Patient Response to Interventions:  Attentive, Engaged, and Receptive   Education Outcome:  Acknowledges education   Clinical Observations/Individualized Feedback: [name] was active in their participation of session activities and group discussion. Pt completed exercises appropriately while interacting well with LRT and peers duration of session.    Plan: Continue to engage patient in RT group sessions 2-3x/week.   Rosina Lowenstein, LRT, CTRS 06/21/2023 3:12 PM

## 2023-06-21 NOTE — Progress Notes (Signed)
Patient is an involuntary admission to [facility] for SAD with depression.  He endorses depression and passive SI without a plan.  Contracts for safety.  Otherwise denies HI, AVH, and anxiety. Interacts well with peers and staff. Participates in group and was in the dayroom for most of the morning watching tv.  Had an episode in the afternoon which seemed related to anxiety.  Dr put order in for atarax for anxiety which the patient took willingly.  Later stated it help relieve his anxiety.  Will continue to monitor.

## 2023-06-21 NOTE — Plan of Care (Signed)
  Problem: Education: Goal: Knowledge of [facility] Education information/materials will improve Outcome: Progressing Goal: Emotional status will improve Outcome: Progressing Goal: Mental status will improve Outcome: Progressing   

## 2023-06-21 NOTE — Group Note (Signed)
Date:  06/21/2023 Time:  2:41 AM  Group Topic/Focus:  Self Care:   The focus of this group is to help patients understand the importance of self-care in order to improve or restore emotional, physical, spiritual, interpersonal, and financial health.    Participation Level:  Active  Participation Quality:  Appropriate  Affect:  Appropriate  Cognitive:  Alert  Insight: Appropriate  Engagement in Group:  Engaged  Modes of Intervention:  Discussion  Additional Comments:    Maeola Harman 06/21/2023, 2:41 AM

## 2023-06-21 NOTE — Progress Notes (Signed)
[facility] MD Progress Note  06/21/2023  [name]  MRN:  [id number]  Pt reports presenting to [facility] with police for "hearing voices, haven't slept in 4 days". Reports poor sleep and appetite. Reports worsening auditory hallucinations for the past 2 days. Auditory hallucinations are command in nature and tell him to "kill myself, to cut my throat", to "hurt people in the house, jump on them".   Subjective: Chart reviewed, case discussed in multidisciplinary meeting today, patient seen during rounds.  Patient reports he is anxious today.  Patient is concerned about his breathing.  Patient was provided support and reassurance.  Patient was informed that hospitalist team is following patient for medical needs.  Patient reports that he does not feel safe going home.  He has passive suicidal thoughts, denies any intention to harm himself on the unit.  Patient said he is also concerned that he might stop breathing.  Patient was in need of constant reassurance.  Patient also reports that his sleep is poor because of stress and anxiety.  Patient was encouraged to attend group and work on coping strategies.  Principal Problem: Undifferentiated schizophrenia (HCC) Diagnosis: Principal Problem:   Undifferentiated schizophrenia (HCC) Active Problems:   COPD with acute exacerbation (HCC)   Essential hypertension   Dyslipidemia   Anxiety and depression    Past Psychiatric History: Schizophrenia   Past Medical History:  Past Medical History:  Diagnosis Date   Acute on chronic respiratory failure with hypoxia (HCC)    Asthma    Coagulation disorder (HCC)    COPD (chronic obstructive pulmonary disease) (HCC)    Depression    History of hiatal hernia    Hypertension    Hypokalemia    Leukocytosis    Lung mass    Pneumonia    Schizophrenia (HCC)    Shortness of breath dyspnea    Stroke [facility])    Umbilical hernia    Ventral hernia     Past Surgical History:  Procedure Laterality Date    COLONOSCOPY WITH PROPOFOL N/A 09/21/2021   Procedure: COLONOSCOPY WITH PROPOFOL;  Surgeon: Regis Bill, MD;  Location: [facility] ENDOSCOPY;  Service: Endoscopy;  Laterality: N/A;   HERNIA REPAIR     INSERTION OF MESH N/A 12/18/2014   Procedure: INSERTION OF MESH;  Surgeon: Lattie Haw, MD;  Location: [facility] ORS;  Service: General;  Laterality: N/A;   SUPRA-UMBILICAL HERNIA  12/18/2014   Procedure: SUPRA-UMBILICAL HERNIA;  Surgeon: Lattie Haw, MD;  Location: [facility] ORS;  Service: General;;   UMBILICAL HERNIA REPAIR N/A 12/18/2014   Procedure: HERNIA REPAIR UMBILICAL ADULT;  Surgeon: Lattie Haw, MD;  Location: [facility] ORS;  Service: General;  Laterality: N/A;   VIDEO BRONCHOSCOPY WITH ENDOBRONCHIAL ULTRASOUND N/A 03/15/2023   Procedure: VIDEO BRONCHOSCOPY WITH ENDOBRONCHIAL ULTRASOUND;  Surgeon: Vida Rigger, MD;  Location: [facility] ORS;  Service: Thoracic;  Laterality: N/A;   Family History:  Family History  Problem Relation Age of Onset   Asthma Mother    Hypertension Father     Social History:  Social History   Substance and Sexual Activity  Alcohol Use Not Currently   Alcohol/week: 75.0 standard drinks of alcohol   Types: 75 Cans of beer per week     Social History   Substance and Sexual Activity  Drug Use Not Currently   Types: Marijuana, "Crack" cocaine   Comment: none in 15 yrs    Social History   Socioeconomic History   Marital status: Widowed  Spouse name: Not on file   Number of children: Not on file   Years of education: Not on file   Highest education level: Not on file  Occupational History   Not on file  Tobacco Use   Smoking status: Every Day    Current packs/day: 0.15    Average packs/day: 0.2 packs/day for 45.0 years (6.8 ttl pk-yrs)    Types: Cigarettes   Smokeless tobacco: Never  Vaping Use   Vaping status: Never Used  Substance and Sexual Activity   Alcohol use: Not Currently    Alcohol/week: 75.0 standard drinks of alcohol    Types: 75  Cans of beer per week   Drug use: Not Currently    Types: Marijuana, "Crack" cocaine    Comment: none in 15 yrs   Sexual activity: Not Currently  Other Topics Concern   Not on file  Social History Narrative   Not on file   Social Determinants of Health   Financial Resource Strain: Not on file  Food Insecurity: No Food Insecurity (06/16/2023)   Hunger Vital Sign    Worried About Running Out of Food in the Last Year: Never true    Ran Out of Food in the Last Year: Never true  Transportation Needs: No Transportation Needs (06/16/2023)   PRAPARE - Administrator, Civil Service (Medical): No    Lack of Transportation (Non-Medical): No  Physical Activity: Not on file  Stress: Not on file  Social Connections: Not on file                          Sleep:Poor  Appetite:  Fair  Current Medications: Current Facility-Administered Medications  Medication Dose Route Frequency Provider Last Rate Last Admin   acetaminophen (TYLENOL) tablet 650 mg  650 mg Oral Q6H PRN Penn, Cicely, NP       alum & mag hydroxide-simeth (MAALOX/MYLANTA) 200-200-20 MG/5ML suspension 30 mL  30 mL Oral Q4H PRN Penn, Cranston Neighbor, NP       aspirin EC tablet 81 mg  81 mg Oral Daily Sarina Ill, DO   81 mg at 06/21/23 1030   chlorpheniramine-HYDROcodone (TUSSIONEX) 10-8 MG/5ML suspension 5 mL  5 mL Oral Q12H PRN Mansy, Jan A, MD       clopidogrel (PLAVIX) tablet 75 mg  75 mg Oral Daily Sarina Ill, DO   75 mg at 06/21/23 1029   diltiazem (CARDIZEM CD) 24 hr capsule 240 mg  240 mg Oral Daily Sarina Ill, DO   240 mg at 06/21/23 1033   diphenhydrAMINE (BENADRYL) capsule 50 mg  50 mg Oral TID PRN Mcneil Sober, NP       Or   diphenhydrAMINE (BENADRYL) injection 50 mg  50 mg Intramuscular TID PRN Mcneil Sober, NP       famotidine (PEPCID) tablet 20 mg  20 mg Oral Daily Sarina Ill, DO   20 mg at 06/21/23 1030   FLUoxetine (PROZAC) capsule 20 mg  20 mg Oral  Daily Sarina Ill, DO   20 mg at 06/21/23 1029   guaiFENesin (MUCINEX) 12 hr tablet 600 mg  600 mg Oral BID Mansy, Jan A, MD   600 mg at 06/21/23 1029   haloperidol (HALDOL) tablet 5 mg  5 mg Oral TID PRN Mcneil Sober, NP       Or   haloperidol lactate (HALDOL) injection 5 mg  5 mg Intramuscular TID PRN Mcneil Sober, NP  hydrOXYzine (ATARAX) tablet 10 mg  10 mg Oral Q6H PRN Lewanda Rife, MD   10 mg at 06/21/23 1400   ipratropium-albuterol (DUONEB) 0.5-2.5 (3) MG/3ML nebulizer solution 3 mL  3 mL Nebulization QID Mansy, Jan A, MD   3 mL at 06/21/23 1204   LORazepam (ATIVAN) tablet 2 mg  2 mg Oral TID PRN Mcneil Sober, NP       Or   LORazepam (ATIVAN) injection 2 mg  2 mg Intramuscular TID PRN Penn, Cranston Neighbor, NP       magnesium hydroxide (MILK OF MAGNESIA) suspension 30 mL  30 mL Oral Daily PRN Penn, Cranston Neighbor, NP       mirtazapine (REMERON) tablet 7.5 mg  7.5 mg Oral QHS Sarina Ill, DO   7.5 mg at 06/20/23 2146   montelukast (SINGULAIR) tablet 10 mg  10 mg Oral QHS Sarina Ill, DO   10 mg at 06/20/23 2146   pravastatin (PRAVACHOL) tablet 20 mg  20 mg Oral q1800 Sarina Ill, DO   20 mg at 06/20/23 1625   [START ON 06/22/2023] predniSONE (DELTASONE) tablet 40 mg  40 mg Oral Q breakfast Lurene Shadow, MD       QUEtiapine (SEROQUEL) tablet 400 mg  400 mg Oral QHS Sarina Ill, DO   400 mg at 06/20/23 2146   traZODone (DESYREL) tablet 200 mg  200 mg Oral QHS PRN Charm Rings, NP   200 mg at 06/20/23 2145   umeclidinium bromide (INCRUSE ELLIPTA) 62.5 MCG/ACT 1 puff  1 puff Inhalation Daily Sarina Ill, DO   1 puff at 06/21/23 1610    Lab Results: No results found for this or any previous visit (from the past 48 hour(s)).  Blood Alcohol level:  Lab Results  Component Value Date   ETH <10 06/13/2023   ETH <10 03/24/2023    Metabolic Disorder Labs: Lab Results  Component Value Date   HGBA1C 5.6 06/17/2023   MPG  114.02 06/17/2023   MPG 119.76 03/26/2023   No results found for: "PROLACTIN" Lab Results  Component Value Date   CHOL 172 06/17/2023   TRIG 102 06/17/2023   HDL 78 06/17/2023   CHOLHDL 2.2 06/17/2023   VLDL 20 06/17/2023   LDLCALC 74 06/17/2023   LDLCALC 51 03/26/2023     Musculoskeletal: Strength & Muscle Tone: within normal limits Gait & Station: normal Patient leans: N/A  Psychiatric Specialty Exam:  Presentation  General Appearance:  Appropriate for Environment  Eye Contact: Fair  Speech: Clear and Coherent  Speech Volume: Soft  Handedness: Right   Mood and Affect  Mood: Depressed, anxious  Affect: Constricted   Thought Process  Thought Processes: Coherent; Goal Directed; Linear  Descriptions of Associations:Intact  Orientation:Full (Time, Place and Person)  Thought Content:Logical  History of Schizophrenia/Schizoaffective disorder:Yes  Duration of Psychotic Symptoms:Greater than six months  Hallucinations:Denies Ideas of Reference:No  Suicidal Thoughts: Reports passive suicidal thoughts, patient feels he is not ready to go home.  Homicidal Thoughts:Denies  Sensorium  Memory: Immediate Fair  Judgment: Intact  Insight: Present   Executive Functions  Concentration: Fair  Attention Span: Fair  Fund of Knowledge: Fair  Language: Fair   Psychomotor Activity  Psychomotor Activity:Decreased  Assets  Assets: Manufacturing systems engineer; Desire for Improvement; Housing   Sleep  Sleep:Fair   Physical Exam: Physical Exam Constitutional:      Appearance: Normal appearance.  HENT:     Head: Normocephalic and atraumatic.     Nose: No  congestion.  Cardiovascular:     Pulses: Normal pulses.  Skin:    General: Skin is warm.  Neurological:     Mental Status: He is alert and oriented to person, place, and time.    Review of Systems  Constitutional:  Negative for chills and fever.  HENT:  Negative for congestion and  hearing loss.   Eyes:  Negative for blurred vision.  Respiratory:  Positive for shortness of breath.   Cardiovascular:  Negative for chest pain and palpitations.  Gastrointestinal:  Negative for nausea and vomiting.  Neurological:  Negative for dizziness and speech change.   Blood pressure (!) 168/79, pulse 100, temperature (!) 97.3 F (36.3 C), resp. rate 20, height 6\' 1"  (1.854 m), weight 100.2 kg, SpO2 97%. Body mass index is 29.16 kg/m.   Treatment Plan Summary: Daily contact with patient to assess and evaluate symptoms and progress in treatment and Medication management  Continue on current treatment Hospitalist team following patient for medical needs including COPD Patient is on quetiapine 200 mg at bedtime and trazodone 50 mg to help with depression and insomnia.  Patient was encouraged to stay out of his room during daytime and not take naps during daytime.   Lewanda Rife, MD

## 2023-06-21 NOTE — Social Work (Addendum)
CSW spoke with patient to engage in discharge planning and assess patient's preference for outpatient therapy services. Patient declined adding that he prefers to be given therapy resources at the time of discharge.   CSW repeated the patient's preferences back to him to ensure that his requests were being interpreted correctly. Patient agreed to receiving therapy resources only at discharge.   CSW will provide requested resources. CSW assessed for any further needs. There are none at the moment.

## 2023-06-22 ENCOUNTER — Encounter: Payer: Self-pay | Admitting: Oncology

## 2023-06-22 DIAGNOSIS — J441 Chronic obstructive pulmonary disease with (acute) exacerbation: Secondary | ICD-10-CM

## 2023-06-22 MED ORDER — IPRATROPIUM-ALBUTEROL 0.5-2.5 (3) MG/3ML IN SOLN
3.0000 mL | Freq: Three times a day (TID) | RESPIRATORY_TRACT | Status: DC
  Administered 2023-06-22 – 2023-07-04 (×36): 3 mL via RESPIRATORY_TRACT
  Filled 2023-06-22 (×37): qty 3

## 2023-06-22 NOTE — Group Note (Signed)
Recreation Therapy Group Note   Group Topic:General Recreation  Group Date: 06/22/2023 Start Time: 1400 End Time: 1450 Facilitators: Rosina Lowenstein, LRT, CTRS Location:  Day Room  Group Description: Bingo. LRT and patients played multiple games of Bingo with music playing in the background. LRT and pts discussed how this could be a leisure interest and the importance of doing things they enjoy post-discharge. Pts won stress balls and [employer] as Chief Financial Officer.   Goal Area(s) Addressed: Patient will identify leisure interests.  Patient will practice healthy decision making. Patient will engage in recreation activity.  Patient will increase communication.    Affect/Mood: Appropriate   Participation Level: Active and Engaged   Participation Quality: Independent   Behavior: Calm and Cooperative   Speech/Thought Process: Coherent   Insight: Fair   Judgement: Fair    Modes of Intervention: Cooperative Play   Patient Response to Interventions:  Attentive, Engaged, and Interested    Education Outcome:  Acknowledges education   Clinical Observations/Individualized Feedback: [name] was acive in their participation of session activities and group discussion. Pt interacted well with LRT and peers duration of session.    Plan: Continue to engage patient in RT group sessions 2-3x/week.   Rosina Lowenstein, LRT, CTRS 06/22/2023 3:18 PM

## 2023-06-22 NOTE — Group Note (Signed)
Date:  06/22/2023 Time:  12:13 PM  Group Topic/Focus:  Overcoming Stress:   The focus of this group is to define stress and help patients assess their triggers.    Participation Level:  Did Not Attend   Participation Quality:    Affect:    Cognitive:    Insight:   Engagement in Group:    Modes of Intervention:    Additional Comments:  Did Not Attend   Jerelyn Trimarco l Abdulhadi Stopa 06/22/2023, 12:13 PM

## 2023-06-22 NOTE — Progress Notes (Signed)
[facility]  - [place] [facility]   PATIENT NAME: [name]    MR#:  [id number]  DATE OF BIRTH:  January 29, 1961  SUBJECTIVE:  patient laying comfortably in bed. Not wearing oxygen. Sats are more than 92% on room air. Feels overall better no wheezing. Patient tells me he takes breathing treatment three times a day at home and uses inhaler. His overall feeling better.    VITALS:  Blood pressure 110/66, pulse 84, temperature 98.8 F (37.1 C), resp. rate 18, height 6\' 1"  (1.854 m), weight 100.2 kg, SpO2 97%.  PHYSICAL EXAMINATION:   GENERAL:  62 y.o.-year-old patient with no acute distress. Obese LUNGS: Normal breath sounds bilaterally, no wheezing CARDIOVASCULAR: S1, S2 normal. No murmur   ABDOMEN: Soft, nontender, nondistended. Bowel sounds present.  EXTREMITIES: No  edema b/l.    NEUROLOGIC: nonfocal  patient is alert and awake SKIN: No obvious rash, lesion, or ulcer.   LABORATORY PANEL:  CBC Recent Labs  Lab 06/16/23 0513  WBC 5.3  HGB 9.5*  HCT 28.9*  PLT 154    Chemistries  Recent Labs  Lab 06/16/23 0513  NA 137  K 3.8  CL 103  CO2 25  GLUCOSE 100*  BUN 14  CREATININE 0.56*  CALCIUM 9.0    Assessment and Plan  [name] is a 62 y.o. male e with medical history significant of COPD Gold stage II in 2015, HTN, HLD, anxiety/depression, schizophrenia, left upper lung mass, who was recently discharged from the hospital on 06/16/2023 after hospitalization for COPD exacerbation.  He was discharged to the behavioral health unit because of auditory hallucinations and suicidal thoughts.  He was discharged on prednisone and doxycycline. The hospitalist team was consulted on 06/20/2023 for COPD exacerbation.  COPD/asthma exacerbation: -- patient completed PO prednisone. No indication for antibiotic. Continue home dose of nebulizer three times a day and oral inhalers. Currently not wheezing. Sats more than 92% on room air. He is advised to see a  pulmonologist. He gets his meds [facility]  Hypertension: Continue Cardizem.    History of stroke: Continue aspirin and Plavix    Schizophrenia, anxiety and depression:per psych psychiatrist.     Comorbidities include hyperlipidemia, left upper lung mass  Overall improved. Will sign off. Please call if needed. Thank you for the consult.  Patient's RN informed  TOTAL TIME TAKING CARE OF THIS PATIENT: 35 minutes.  >50% time spent on counselling and coordination of care  Note: This dictation was prepared with Dragon dictation along with smaller phrase technology. Any transcriptional errors that result from this process are unintentional.  Enedina Finner M.D    [facility]   CC: Primary care physician; Emogene Morgan, MD

## 2023-06-22 NOTE — Progress Notes (Signed)
[facility] MD Progress Note  06/22/2023  [name]  MRN:  [id number]  Pt reports presenting to [facility] with police for "hearing voices, haven't slept in 4 days". Reports poor sleep and appetite. Reports worsening auditory hallucinations for the past 2 days. Auditory hallucinations are command in nature and tell him to "kill myself, to cut my throat", to "hurt people in the house, jump on them".   Subjective: Chart reviewed, case discussed in multidisciplinary meeting today, patient seen during rounds.  Patient reports he feels "fine" today. Patient reports his breathing is better. He reports he slept good last night. He reports SI " sometimes". Pt was provided with support and reassurance.  Patient was encouraged to attend group and work on coping strategies.  Principal Problem: Undifferentiated schizophrenia (HCC) Diagnosis: Principal Problem:   Undifferentiated schizophrenia (HCC) Active Problems:   COPD with acute exacerbation (HCC)   Essential hypertension   Dyslipidemia   Anxiety and depression    Past Psychiatric History: Schizophrenia   Past Medical History:  Past Medical History:  Diagnosis Date   Acute on chronic respiratory failure with hypoxia (HCC)    Asthma    Coagulation disorder (HCC)    COPD (chronic obstructive pulmonary disease) (HCC)    Depression    History of hiatal hernia    Hypertension    Hypokalemia    Leukocytosis    Lung mass    Pneumonia    Schizophrenia (HCC)    Shortness of breath dyspnea    Stroke [facility])    Umbilical hernia    Ventral hernia     Past Surgical History:  Procedure Laterality Date   COLONOSCOPY WITH PROPOFOL N/A 09/21/2021   Procedure: COLONOSCOPY WITH PROPOFOL;  Surgeon: Regis Bill, MD;  Location: [facility] ENDOSCOPY;  Service: Endoscopy;  Laterality: N/A;   HERNIA REPAIR     INSERTION OF MESH N/A 12/18/2014   Procedure: INSERTION OF MESH;  Surgeon: Lattie Haw, MD;  Location: [facility] ORS;  Service: General;  Laterality: N/A;    SUPRA-UMBILICAL HERNIA  12/18/2014   Procedure: SUPRA-UMBILICAL HERNIA;  Surgeon: Lattie Haw, MD;  Location: [facility] ORS;  Service: General;;   UMBILICAL HERNIA REPAIR N/A 12/18/2014   Procedure: HERNIA REPAIR UMBILICAL ADULT;  Surgeon: Lattie Haw, MD;  Location: [facility] ORS;  Service: General;  Laterality: N/A;   VIDEO BRONCHOSCOPY WITH ENDOBRONCHIAL ULTRASOUND N/A 03/15/2023   Procedure: VIDEO BRONCHOSCOPY WITH ENDOBRONCHIAL ULTRASOUND;  Surgeon: Vida Rigger, MD;  Location: [facility] ORS;  Service: Thoracic;  Laterality: N/A;   Family History:  Family History  Problem Relation Age of Onset   Asthma Mother    Hypertension Father     Social History:  Social History   Substance and Sexual Activity  Alcohol Use Not Currently   Alcohol/week: 75.0 standard drinks of alcohol   Types: 75 Cans of beer per week     Social History   Substance and Sexual Activity  Drug Use Not Currently   Types: Marijuana, "Crack" cocaine   Comment: none in 15 yrs    Social History   Socioeconomic History   Marital status: Widowed    Spouse name: Not on file   Number of children: Not on file   Years of education: Not on file   Highest education level: Not on file  Occupational History   Not on file  Tobacco Use   Smoking status: Every Day    Current packs/day: 0.15    Average packs/day: 0.2 packs/day for 45.0 years (  6.8 ttl pk-yrs)    Types: Cigarettes   Smokeless tobacco: Never  Vaping Use   Vaping status: Never Used  Substance and Sexual Activity   Alcohol use: Not Currently    Alcohol/week: 75.0 standard drinks of alcohol    Types: 75 Cans of beer per week   Drug use: Not Currently    Types: Marijuana, "Crack" cocaine    Comment: none in 15 yrs   Sexual activity: Not Currently  Other Topics Concern   Not on file  Social History Narrative   Not on file   Social Determinants of Health   Financial Resource Strain: Not on file  Food Insecurity: No Food Insecurity (06/16/2023)    Hunger Vital Sign    Worried About Running Out of Food in the Last Year: Never true    Ran Out of Food in the Last Year: Never true  Transportation Needs: No Transportation Needs (06/16/2023)   PRAPARE - Administrator, Civil Service (Medical): No    Lack of Transportation (Non-Medical): No  Physical Activity: Not on file  Stress: Not on file  Social Connections: Not on file                          Sleep:Poor  Appetite:  Fair  Current Medications: Current Facility-Administered Medications  Medication Dose Route Frequency Provider Last Rate Last Admin   acetaminophen (TYLENOL) tablet 650 mg  650 mg Oral Q6H PRN Penn, Cicely, NP       alum & mag hydroxide-simeth (MAALOX/MYLANTA) 200-200-20 MG/5ML suspension 30 mL  30 mL Oral Q4H PRN Penn, Cranston Neighbor, NP       aspirin EC tablet 81 mg  81 mg Oral Daily Sarina Ill, DO   81 mg at 06/22/23 0754   chlorpheniramine-HYDROcodone (TUSSIONEX) 10-8 MG/5ML suspension 5 mL  5 mL Oral Q12H PRN Mansy, Jan A, MD       clopidogrel (PLAVIX) tablet 75 mg  75 mg Oral Daily Sarina Ill, DO   75 mg at 06/22/23 0754   diltiazem (CARDIZEM CD) 24 hr capsule 240 mg  240 mg Oral Daily Sarina Ill, DO   240 mg at 06/22/23 1610   diphenhydrAMINE (BENADRYL) capsule 50 mg  50 mg Oral TID PRN Mcneil Sober, NP       Or   diphenhydrAMINE (BENADRYL) injection 50 mg  50 mg Intramuscular TID PRN Mcneil Sober, NP       famotidine (PEPCID) tablet 20 mg  20 mg Oral Daily Sarina Ill, DO   20 mg at 06/22/23 0755   FLUoxetine (PROZAC) capsule 20 mg  20 mg Oral Daily Sarina Ill, DO   20 mg at 06/22/23 0755   guaiFENesin (MUCINEX) 12 hr tablet 600 mg  600 mg Oral BID Mansy, Jan A, MD   600 mg at 06/22/23 0754   haloperidol (HALDOL) tablet 5 mg  5 mg Oral TID PRN Mcneil Sober, NP       Or   haloperidol lactate (HALDOL) injection 5 mg  5 mg Intramuscular TID PRN Mcneil Sober, NP       hydrOXYzine  (ATARAX) tablet 10 mg  10 mg Oral Q6H PRN Lewanda Rife, MD   10 mg at 06/21/23 1400   ipratropium-albuterol (DUONEB) 0.5-2.5 (3) MG/3ML nebulizer solution 3 mL  3 mL Nebulization TID Enedina Finner, MD   3 mL at 06/22/23 1609   LORazepam (ATIVAN) tablet 2 mg  2  mg Oral TID PRN Mcneil Sober, NP       Or   LORazepam (ATIVAN) injection 2 mg  2 mg Intramuscular TID PRN Penn, Cranston Neighbor, NP       magnesium hydroxide (MILK OF MAGNESIA) suspension 30 mL  30 mL Oral Daily PRN Penn, Cranston Neighbor, NP       mirtazapine (REMERON) tablet 7.5 mg  7.5 mg Oral QHS Sarina Ill, DO   7.5 mg at 06/21/23 2103   montelukast (SINGULAIR) tablet 10 mg  10 mg Oral QHS Sarina Ill, DO   10 mg at 06/21/23 2103   pravastatin (PRAVACHOL) tablet 20 mg  20 mg Oral q1800 Sarina Ill, DO   20 mg at 06/22/23 1609   QUEtiapine (SEROQUEL) tablet 400 mg  400 mg Oral QHS Sarina Ill, DO   400 mg at 06/21/23 2103   traZODone (DESYREL) tablet 200 mg  200 mg Oral QHS PRN Charm Rings, NP   200 mg at 06/21/23 2103   umeclidinium bromide (INCRUSE ELLIPTA) 62.5 MCG/ACT 1 puff  1 puff Inhalation Daily Sarina Ill, DO   1 puff at 06/22/23 0756    Lab Results: No results found for this or any previous visit (from the past 48 hour(s)).  Blood Alcohol level:  Lab Results  Component Value Date   ETH <10 06/13/2023   ETH <10 03/24/2023    Metabolic Disorder Labs: Lab Results  Component Value Date   HGBA1C 5.6 06/17/2023   MPG 114.02 06/17/2023   MPG 119.76 03/26/2023   No results found for: "PROLACTIN" Lab Results  Component Value Date   CHOL 172 06/17/2023   TRIG 102 06/17/2023   HDL 78 06/17/2023   CHOLHDL 2.2 06/17/2023   VLDL 20 06/17/2023   LDLCALC 74 06/17/2023   LDLCALC 51 03/26/2023     Musculoskeletal: Strength & Muscle Tone: within normal limits Gait & Station: normal Patient leans: N/A  Psychiatric Specialty Exam:  Presentation  General Appearance:   Appropriate for Environment  Eye Contact: Fair  Speech: Clear and Coherent  Speech Volume: Soft  Handedness: Right   Mood and Affect  Mood: Depressed, anxious  Affect: Constricted   Thought Process  Thought Processes: Coherent; Goal Directed; Linear  Descriptions of Associations:Intact  Orientation:Full (Time, Place and Person)  Thought Content:Logical  History of Schizophrenia/Schizoaffective disorder:Yes  Duration of Psychotic Symptoms:Greater than six months  Hallucinations:Denies Ideas of Reference:No  Suicidal Thoughts: Reports passive suicidal thoughts  Homicidal Thoughts:Denies  Sensorium  Memory: Immediate Fair  Judgment: Intact  Insight: Present   Executive Functions  Concentration: Fair  Attention Span: Fair  Fund of Knowledge: Fair  Language: Fair   Psychomotor Activity  Psychomotor Activity:Decreased  Assets  Assets: Manufacturing systems engineer; Desire for Improvement; Housing   Sleep  Sleep:Fair   Physical Exam: Physical Exam Constitutional:      Appearance: Normal appearance.  HENT:     Head: Normocephalic and atraumatic.     Nose: No congestion.  Cardiovascular:     Pulses: Normal pulses.  Skin:    General: Skin is warm.  Neurological:     Mental Status: He is alert and oriented to person, place, and time.    Review of Systems  Constitutional:  Negative for chills and fever.  HENT:  Negative for congestion and hearing loss.   Eyes:  Negative for blurred vision.  Respiratory:  Positive for shortness of breath.   Cardiovascular:  Negative for chest pain and palpitations.  Gastrointestinal:  Negative for nausea and vomiting.  Neurological:  Negative for dizziness and speech change.   Blood pressure (!) 119/90, pulse 91, temperature 98.3 F (36.8 C), resp. rate 17, height 6\' 1"  (1.854 m), weight 100.2 kg, SpO2 98%. Body mass index is 29.16 kg/m.   Treatment Plan Summary: Daily contact with patient to  assess and evaluate symptoms and progress in treatment and Medication management  Continue on current treatment Hospitalist team following patient for medical needs including COPD Patient is on quetiapine 200 mg at bedtime and trazodone 50 mg to help with depression and insomnia.  Patient was encouraged to stay out of his room during daytime and not take naps during daytime.   Lewanda Rife, MD

## 2023-06-22 NOTE — Progress Notes (Signed)
   06/22/23 0731  Psych Admission Type (Psych Patients Only)  Admission Status Involuntary  Psychosocial Assessment  Patient Complaints None  Eye Contact Brief  Facial Expression Blank  Affect Sad  Speech Logical/coherent  Interaction Minimal  Motor Activity Slow  Appearance/Hygiene In scrubs  Behavior Characteristics Cooperative  Mood Pleasant  Thought Process  Coherency WDL  Content WDL  Delusions None reported or observed  Perception WDL  Hallucination None reported or observed  Judgment WDL  Confusion None  Danger to Self  Current suicidal ideation? Denies  Danger to Others  Danger to Others None reported or observed

## 2023-06-22 NOTE — [facility] Counselor (Signed)
CSW contacted [employer] at [phone], they report they cannot schedule it has to be the Production designer, theatre/television/film.   CSW contacted [employer] manager at [phone], she reports she is in traffic and that she wants CSW to call her in an hour.   Reynaldo Minium, MSW, [place] 06/22/2023 2:46 PM

## 2023-06-22 NOTE — Group Note (Signed)
Date:  06/22/2023 Time:  9:26 PM  Group Topic/Focus:  Goals Group:   The focus of this group is to help patients establish daily goals to achieve during treatment and discuss how the patient can incorporate goal setting into their daily lives to aide in recovery.    Participation Level:  Active  Participation Quality:  Appropriate  Affect:  Appropriate  Cognitive:  Appropriate  Insight: Good  Engagement in Group:  Engaged  Modes of Intervention:  Discussion  Additional Comments:    Burt Ek 06/22/2023, 9:26 PM

## 2023-06-23 DIAGNOSIS — J441 Chronic obstructive pulmonary disease with (acute) exacerbation: Secondary | ICD-10-CM | POA: Diagnosis not present

## 2023-06-23 NOTE — Group Note (Signed)
Recreation Therapy Group Note   Group Topic:Leisure Education  Group Date: 06/23/2023 Start Time: 1400 End Time: 1455 Facilitators: Rosina Lowenstein, LRT, CTRS Location: Courtyard  Group Description: Leisure. Patients were given the opportunity to play ring toss, play corn hole, or listen to music while sitting in the courtyard getting fresh air and sunlight. Pt identified and conversated about things they enjoy doing in their free time and how they can continue to do that outside of the hospital.  Goal Area(s) Addressed: Patient will learn the definition of "leisure". Patient will practice making a positive decision. Patient will have the opportunity to try a new leisure activity. Patient will communicate with peers and LRT.   Affect/Mood: N/A   Participation Level: Did not attend    Clinical Observations/Individualized Feedback: [name] did not attend group.   Plan: Continue to engage patient in RT group sessions 2-3x/week.   Rosina Lowenstein, LRT, CTRS 06/23/2023 3:13 PM

## 2023-06-23 NOTE — Progress Notes (Signed)
[facility] MD Progress Note  06/23/2023  [name]  MRN:  [id number]  Pt reports presenting to [facility] with police for "hearing voices, haven't slept in 4 days". Reports poor sleep and appetite. Reports worsening auditory hallucinations for the past 2 days. Auditory hallucinations are command in nature and tell him to "kill myself, to cut my throat", to "hurt people in the house, jump on them".   Subjective: Chart reviewed, case discussed in multidisciplinary meeting today, patient seen during rounds.  No new acute events reported overnight.  Patient reports he feels "fine" today. Patient reports his breathing is better.  Patient denies thoughts of harming himself today which is an improvement.  Patient reports he has attended few groups.  Patient was encouraged to attend group and work on coping strategies. Sw has contacted the group home supervisor to evaluate the patient for baseline and discharge.  Principal Problem: Undifferentiated schizophrenia (HCC) Diagnosis: Principal Problem:   Undifferentiated schizophrenia (HCC) Active Problems:   COPD with acute exacerbation (HCC)   Essential hypertension   Dyslipidemia   Anxiety and depression    Past Psychiatric History: Schizophrenia   Past Medical History:  Past Medical History:  Diagnosis Date   Acute on chronic respiratory failure with hypoxia (HCC)    Asthma    Coagulation disorder (HCC)    COPD (chronic obstructive pulmonary disease) (HCC)    Depression    History of hiatal hernia    Hypertension    Hypokalemia    Leukocytosis    Lung mass    Pneumonia    Schizophrenia (HCC)    Shortness of breath dyspnea    Stroke [facility])    Umbilical hernia    Ventral hernia     Past Surgical History:  Procedure Laterality Date   COLONOSCOPY WITH PROPOFOL N/A 09/21/2021   Procedure: COLONOSCOPY WITH PROPOFOL;  Surgeon: Regis Bill, MD;  Location: [facility] ENDOSCOPY;  Service: Endoscopy;  Laterality: N/A;   HERNIA REPAIR     INSERTION  OF MESH N/A 12/18/2014   Procedure: INSERTION OF MESH;  Surgeon: Lattie Haw, MD;  Location: [facility] ORS;  Service: General;  Laterality: N/A;   SUPRA-UMBILICAL HERNIA  12/18/2014   Procedure: SUPRA-UMBILICAL HERNIA;  Surgeon: Lattie Haw, MD;  Location: [facility] ORS;  Service: General;;   UMBILICAL HERNIA REPAIR N/A 12/18/2014   Procedure: HERNIA REPAIR UMBILICAL ADULT;  Surgeon: Lattie Haw, MD;  Location: [facility] ORS;  Service: General;  Laterality: N/A;   VIDEO BRONCHOSCOPY WITH ENDOBRONCHIAL ULTRASOUND N/A 03/15/2023   Procedure: VIDEO BRONCHOSCOPY WITH ENDOBRONCHIAL ULTRASOUND;  Surgeon: Vida Rigger, MD;  Location: [facility] ORS;  Service: Thoracic;  Laterality: N/A;   Family History:  Family History  Problem Relation Age of Onset   Asthma Mother    Hypertension Father     Social History:  Social History   Substance and Sexual Activity  Alcohol Use Not Currently   Alcohol/week: 75.0 standard drinks of alcohol   Types: 75 Cans of beer per week     Social History   Substance and Sexual Activity  Drug Use Not Currently   Types: Marijuana, "Crack" cocaine   Comment: none in 15 yrs    Social History   Socioeconomic History   Marital status: Widowed    Spouse name: Not on file   Number of children: Not on file   Years of education: Not on file   Highest education level: Not on file  Occupational History   Not on file  Tobacco  Use   Smoking status: Every Day    Current packs/day: 0.15    Average packs/day: 0.2 packs/day for 45.0 years (6.8 ttl pk-yrs)    Types: Cigarettes   Smokeless tobacco: Never  Vaping Use   Vaping status: Never Used  Substance and Sexual Activity   Alcohol use: Not Currently    Alcohol/week: 75.0 standard drinks of alcohol    Types: 75 Cans of beer per week   Drug use: Not Currently    Types: Marijuana, "Crack" cocaine    Comment: none in 15 yrs   Sexual activity: Not Currently  Other Topics Concern   Not on file  Social History Narrative    Not on file   Social Determinants of Health   Financial Resource Strain: Not on file  Food Insecurity: No Food Insecurity (06/16/2023)   Hunger Vital Sign    Worried About Running Out of Food in the Last Year: Never true    Ran Out of Food in the Last Year: Never true  Transportation Needs: No Transportation Needs (06/16/2023)   PRAPARE - Administrator, Civil Service (Medical): No    Lack of Transportation (Non-Medical): No  Physical Activity: Not on file  Stress: Not on file  Social Connections: Not on file                          Sleep:Poor  Appetite:  Fair  Current Medications: Current Facility-Administered Medications  Medication Dose Route Frequency Provider Last Rate Last Admin   acetaminophen (TYLENOL) tablet 650 mg  650 mg Oral Q6H PRN Penn, Cicely, NP       alum & mag hydroxide-simeth (MAALOX/MYLANTA) 200-200-20 MG/5ML suspension 30 mL  30 mL Oral Q4H PRN Penn, Cranston Neighbor, NP       aspirin EC tablet 81 mg  81 mg Oral Daily Sarina Ill, DO   81 mg at 06/23/23 0845   chlorpheniramine-HYDROcodone (TUSSIONEX) 10-8 MG/5ML suspension 5 mL  5 mL Oral Q12H PRN Mansy, Jan A, MD       clopidogrel (PLAVIX) tablet 75 mg  75 mg Oral Daily Sarina Ill, DO   75 mg at 06/23/23 0845   diltiazem (CARDIZEM CD) 24 hr capsule 240 mg  240 mg Oral Daily Sarina Ill, DO   240 mg at 06/23/23 0845   diphenhydrAMINE (BENADRYL) capsule 50 mg  50 mg Oral TID PRN Mcneil Sober, NP       Or   diphenhydrAMINE (BENADRYL) injection 50 mg  50 mg Intramuscular TID PRN Mcneil Sober, NP       famotidine (PEPCID) tablet 20 mg  20 mg Oral Daily Sarina Ill, DO   20 mg at 06/23/23 0845   FLUoxetine (PROZAC) capsule 20 mg  20 mg Oral Daily Sarina Ill, DO   20 mg at 06/23/23 0845   guaiFENesin (MUCINEX) 12 hr tablet 600 mg  600 mg Oral BID Mansy, Jan A, MD   600 mg at 06/23/23 0845   haloperidol (HALDOL) tablet 5 mg  5 mg Oral TID  PRN Mcneil Sober, NP       Or   haloperidol lactate (HALDOL) injection 5 mg  5 mg Intramuscular TID PRN Mcneil Sober, NP       hydrOXYzine (ATARAX) tablet 10 mg  10 mg Oral Q6H PRN Lewanda Rife, MD   10 mg at 06/23/23 0845   ipratropium-albuterol (DUONEB) 0.5-2.5 (3) MG/3ML nebulizer solution 3 mL  3 mL Nebulization TID Enedina Finner, MD   3 mL at 06/23/23 1516   LORazepam (ATIVAN) tablet 2 mg  2 mg Oral TID PRN Mcneil Sober, NP       Or   LORazepam (ATIVAN) injection 2 mg  2 mg Intramuscular TID PRN Mcneil Sober, NP       magnesium hydroxide (MILK OF MAGNESIA) suspension 30 mL  30 mL Oral Daily PRN Penn, Cranston Neighbor, NP       mirtazapine (REMERON) tablet 7.5 mg  7.5 mg Oral QHS Sarina Ill, DO   7.5 mg at 06/22/23 2128   montelukast (SINGULAIR) tablet 10 mg  10 mg Oral QHS Sarina Ill, DO   10 mg at 06/22/23 2127   pravastatin (PRAVACHOL) tablet 20 mg  20 mg Oral q1800 Sarina Ill, DO   20 mg at 06/23/23 1832   QUEtiapine (SEROQUEL) tablet 400 mg  400 mg Oral QHS Sarina Ill, DO   400 mg at 06/22/23 2127   traZODone (DESYREL) tablet 200 mg  200 mg Oral QHS PRN Charm Rings, NP   200 mg at 06/22/23 2127   umeclidinium bromide (INCRUSE ELLIPTA) 62.5 MCG/ACT 1 puff  1 puff Inhalation Daily Sarina Ill, DO   1 puff at 06/23/23 4098    Lab Results: No results found for this or any previous visit (from the past 48 hour(s)).  Blood Alcohol level:  Lab Results  Component Value Date   ETH <10 06/13/2023   ETH <10 03/24/2023    Metabolic Disorder Labs: Lab Results  Component Value Date   HGBA1C 5.6 06/17/2023   MPG 114.02 06/17/2023   MPG 119.76 03/26/2023   No results found for: "PROLACTIN" Lab Results  Component Value Date   CHOL 172 06/17/2023   TRIG 102 06/17/2023   HDL 78 06/17/2023   CHOLHDL 2.2 06/17/2023   VLDL 20 06/17/2023   LDLCALC 74 06/17/2023   LDLCALC 51 03/26/2023     Musculoskeletal: Strength & Muscle  Tone: within normal limits Gait & Station: normal Patient leans: N/A  Psychiatric Specialty Exam:  Presentation  General Appearance:  Appropriate for Environment  Eye Contact: Fair  Speech: Clear and Coherent  Speech Volume: Soft  Handedness: Right   Mood and Affect  Mood: Fine  Affect: Less cionstricted   Thought Process  Thought Processes: Coherent; Goal Directed; Linear  Descriptions of Associations:Intact  Orientation:Full (Time, Place and Person)  Thought Content:Logical  History of Schizophrenia/Schizoaffective disorder:Yes  Duration of Psychotic Symptoms:Greater than six months  Hallucinations:Denies Ideas of Reference:No  Suicidal Thoughts: Denies suicidal thoughts  Homicidal Thoughts:Denies  Sensorium  Memory: Immediate Fair  Judgment: Intact  Insight: Present   Executive Functions  Concentration: Fair  Attention Span: Fair  Fund of Knowledge: Fair  Language: Fair   Psychomotor Activity  Psychomotor Activity:Decreased  Assets  Assets: Manufacturing systems engineer; Desire for Improvement; Housing   Sleep  Sleep:Fair   Physical Exam: Physical Exam Constitutional:      Appearance: Normal appearance.  HENT:     Head: Normocephalic and atraumatic.     Nose: No congestion.  Cardiovascular:     Pulses: Normal pulses.  Skin:    General: Skin is warm.  Neurological:     Mental Status: He is alert and oriented to person, place, and time.    Review of Systems  Constitutional:  Negative for chills and fever.  HENT:  Negative for congestion and hearing loss.   Eyes:  Negative for blurred  vision.  Respiratory:  Negative for shortness of breath.   Cardiovascular:  Negative for chest pain and palpitations.  Gastrointestinal:  Negative for nausea and vomiting.  Neurological:  Negative for dizziness and speech change.   Blood pressure 119/82, pulse 97, temperature 98 F (36.7 C), resp. rate 18, height 6\' 1"  (1.854 m),  weight 100.2 kg, SpO2 97%. Body mass index is 29.16 kg/m.   Treatment Plan Summary: Daily contact with patient to assess and evaluate symptoms and progress in treatment and Medication management  Continue on current treatment Hospitalist team following patient for medical needs including COPD Patient is on quetiapine 200 mg at bedtime and trazodone 50 mg to help with depression and insomnia.  Patient was encouraged to stay out of his room during daytime and not take naps during daytime. Sw has contacted the group home supervisor to evaluate the patient for baseline and discharge.   Lewanda Rife, MD

## 2023-06-23 NOTE — Group Note (Signed)
[facility] LCSW Group Therapy Note   Group Date: 06/23/2023 Start Time: 1300 End Time: 1400   Type of Therapy/Topic:  Group Therapy:  Emotion Regulation  Participation Level:  Did Not Attend   Mood: X  Description of Group:    The purpose of this group is to assist patients in learning to regulate negative emotions and experience positive emotions. Patients will be guided to discuss ways in which they have been vulnerable to their negative emotions. These vulnerabilities will be juxtaposed with experiences of positive emotions or situations, and patients challenged to use positive emotions to combat negative ones. Special emphasis will be placed on coping with negative emotions in conflict situations, and patients will process healthy conflict resolution skills.  Therapeutic Goals: Patient will identify two positive emotions or experiences to reflect on in order to balance out negative emotions:  Patient will label two or more emotions that they find the most difficult to experience:  Patient will be able to demonstrate positive conflict resolution skills through discussion or role plays:   Summary of Patient Progress:   X    Therapeutic Modalities:   Cognitive Behavioral Therapy Feelings Identification Dialectical Behavioral Therapy   Elza Rafter, LCSWA

## 2023-06-23 NOTE — Plan of Care (Signed)
Patient alert and oriented. Flat affect. Visible in the dayroom, watching TV. Limited interaction with peers and staff. Endorses anxiety level 7/10. Pt remains concern about his breathing. Mini Neb tx given as scheduled for SOB/cough. PRN given as ordered for insomnia. Tol well. Q 15 min checks maintained for safety. Denies SI, HI, AVH, and pain.  Patient remains safe at this time.  Problem: Education: Goal: Ability to state activities that reduce stress will improve Outcome: Progressing   Problem: Coping: Goal: Ability to identify and develop effective coping behavior will improve Outcome: Progressing   Problem: Self-Concept: Goal: Ability to identify factors that promote anxiety will improve Outcome: Progressing Goal: Level of anxiety will decrease Outcome: Progressing

## 2023-06-23 NOTE — Plan of Care (Signed)
?  Problem: Education: ?Goal: Knowledge of [facility] Education information/materials will improve ?Outcome: Progressing ?Goal: Emotional status will improve ?Outcome: Progressing ?Goal: Mental status will improve ?Outcome: Progressing ?Goal: Verbalization of understanding the information provided will improve ?Outcome: Progressing ?  ?Problem: Activity: ?Goal: Interest or engagement in activities will improve ?Outcome: Progressing ?Goal: Sleeping patterns will improve ?Outcome: Progressing ?  ?Problem: Coping: ?Goal: Ability to verbalize frustrations and anger appropriately will improve ?Outcome: Progressing ?Goal: Ability to demonstrate self-control will improve ?Outcome: Progressing ?  ?Problem: Health Behavior/Discharge Planning: ?Goal: Identification of resources available to assist in meeting health care needs will improve ?Outcome: Progressing ?Goal: Compliance with treatment plan for underlying cause of condition will improve ?Outcome: Progressing ?  ?Problem: Physical Regulation: ?Goal: Ability to maintain clinical measurements within normal limits will improve ?Outcome: Progressing ?  ?Problem: Safety: ?Goal: Periods of time without injury will increase ?Outcome: Progressing ?  ?Problem: Education: ?Goal: Ability to state activities that reduce stress will improve ?Outcome: Progressing ?  ?Problem: Coping: ?Goal: Ability to identify and develop effective coping behavior will improve ?Outcome: Progressing ?  ?Problem: Self-Concept: ?Goal: Ability to identify factors that promote anxiety will improve ?Outcome: Progressing ?Goal: Level of anxiety will decrease ?Outcome: Progressing ?Goal: Ability to modify response to factors that promote anxiety will improve ?Outcome: Progressing ?  ?Problem: Education: ?Goal: Utilization of techniques to improve thought processes will improve ?Outcome: Progressing ?Goal: Knowledge of the prescribed therapeutic regimen will improve ?Outcome: Progressing ?  ?Problem:  Activity: ?Goal: Interest or engagement in leisure activities will improve ?Outcome: Progressing ?Goal: Imbalance in normal sleep/wake cycle will improve ?Outcome: Progressing ?  ?Problem: Coping: ?Goal: Coping ability will improve ?Outcome: Progressing ?Goal: Will verbalize feelings ?Outcome: Progressing ?  ?Problem: Health Behavior/Discharge Planning: ?Goal: Ability to make decisions will improve ?Outcome: Progressing ?Goal: Compliance with therapeutic regimen will improve ?Outcome: Progressing ?  ?Problem: Role Relationship: ?Goal: Will demonstrate positive changes in social behaviors and relationships ?Outcome: Progressing ?  ?Problem: Safety: ?Goal: Ability to disclose and discuss suicidal ideas will improve ?Outcome: Progressing ?Goal: Ability to identify and utilize support systems that promote safety will improve ?Outcome: Progressing ?  ?Problem: Self-Concept: ?Goal: Will verbalize positive feelings about self ?Outcome: Progressing ?Goal: Level of anxiety will decrease ?Outcome: Progressing ?  ?

## 2023-06-23 NOTE — Progress Notes (Signed)
   06/23/23 0728  Psych Admission Type (Psych Patients Only)  Admission Status Involuntary  Psychosocial Assessment  Eye Contact Brief  Facial Expression Blank  Affect Sad  Speech Logical/coherent  Interaction Minimal  Motor Activity Slow  Appearance/Hygiene In scrubs  Thought Process  Coherency WDL  Content WDL  Delusions None reported or observed  Perception WDL  Hallucination Auditory;Command;Visual  Judgment WDL  Confusion None  Danger to Self  Current suicidal ideation? Denies  Danger to Others  Danger to Others None reported or observed  Danger to Others Abnormal  Harmful Behavior to others No threats or harm toward other people

## 2023-06-23 NOTE — Progress Notes (Signed)
   06/23/23 0630  15 Minute Checks  Location Bedroom  Visual Appearance Calm  Behavior Sleeping  Sleep (Behavioral Health Patients Only)  Calculate sleep? (Click Yes once per 24 hr at 0600 safety check) Yes  Documented sleep last 24 hours 11

## 2023-06-23 NOTE — BHH Counselor (Signed)
CSW contacted [facility] at [phone].   Manager reports she can't do an assessment until Tuesday.   This Secretary/administrator that pt is up for discharge and he needs to come home or he will have a bill from the hospital.   Manager reports that she needs to "make a call right quick" and disconnected the call.    Reynaldo Minium, MSW, [place] 06/23/2023 11:35 AM

## 2023-06-23 NOTE — Group Note (Unsigned)
Date:  06/23/2023 Time:  11:19 PM  Group Topic/Focus:  Watch Movie     Participation Level:  {BHH PARTICIPATION HQION:62952}  Participation Quality:  {BHH PARTICIPATION QUALITY:22265}  Affect:  {BHH AFFECT:22266}  Cognitive:  {BHH COGNITIVE:22267}  Insight: {BHH Insight2:20797}  Engagement in Group:  {BHH ENGAGEMENT IN GROUP:22268}  Modes of Intervention:  {BHH MODES OF INTERVENTION:22269}  Additional Comments:  ***  Lenore Cordia 06/23/2023, 11:19 PM

## 2023-06-24 DIAGNOSIS — J441 Chronic obstructive pulmonary disease with (acute) exacerbation: Secondary | ICD-10-CM | POA: Diagnosis not present

## 2023-06-24 NOTE — Progress Notes (Signed)
Patient admitted IVC to [facility] unit on June 16, 2023 for endorsing AH and poor sleep.  Patient denies SI/HI. He is still endorsing command hallucinations but improved sleep. Support and encouragement provided. Patient's breathing appears to be less labored than usual. No PRN's requested or adm.  Discharge planning ongoing.

## 2023-06-24 NOTE — BH IP Treatment Plan (Signed)
Interdisciplinary Treatment and Diagnostic Plan Update  06/24/2023 Time of Session: 1:30 pm MOX YERBY MRN: [id number]  Principal Diagnosis: Undifferentiated schizophrenia [facility])  Secondary Diagnoses: Principal Problem:   Undifferentiated schizophrenia (HCC) Active Problems:   COPD with acute exacerbation (HCC)   Essential hypertension   Dyslipidemia   Anxiety and depression   Current Medications:  Current Facility-Administered Medications  Medication Dose Route Frequency Provider Last Rate Last Admin   acetaminophen (TYLENOL) tablet 650 mg  650 mg Oral Q6H PRN Penn, Cicely, NP       alum & mag hydroxide-simeth (MAALOX/MYLANTA) 200-200-20 MG/5ML suspension 30 mL  30 mL Oral Q4H PRN Penn, Cranston Neighbor, NP       aspirin EC tablet 81 mg  81 mg Oral Daily Sarina Ill, DO   81 mg at 06/24/23 1007   chlorpheniramine-HYDROcodone (TUSSIONEX) 10-8 MG/5ML suspension 5 mL  5 mL Oral Q12H PRN Mansy, Jan A, MD       clopidogrel (PLAVIX) tablet 75 mg  75 mg Oral Daily Sarina Ill, DO   75 mg at 06/24/23 1007   diltiazem (CARDIZEM CD) 24 hr capsule 240 mg  240 mg Oral Daily Sarina Ill, DO   240 mg at 06/24/23 1007   diphenhydrAMINE (BENADRYL) capsule 50 mg  50 mg Oral TID PRN Mcneil Sober, NP       Or   diphenhydrAMINE (BENADRYL) injection 50 mg  50 mg Intramuscular TID PRN Mcneil Sober, NP       famotidine (PEPCID) tablet 20 mg  20 mg Oral Daily Sarina Ill, DO   20 mg at 06/24/23 1007   FLUoxetine (PROZAC) capsule 20 mg  20 mg Oral Daily Sarina Ill, DO   20 mg at 06/24/23 1007   guaiFENesin (MUCINEX) 12 hr tablet 600 mg  600 mg Oral BID Mansy, Jan A, MD   600 mg at 06/24/23 1007   haloperidol (HALDOL) tablet 5 mg  5 mg Oral TID PRN Mcneil Sober, NP       Or   haloperidol lactate (HALDOL) injection 5 mg  5 mg Intramuscular TID PRN Mcneil Sober, NP       hydrOXYzine (ATARAX) tablet 10 mg  10 mg Oral Q6H PRN Lewanda Rife, MD   10 mg at  06/23/23 0845   ipratropium-albuterol (DUONEB) 0.5-2.5 (3) MG/3ML nebulizer solution 3 mL  3 mL Nebulization TID Enedina Finner, MD   3 mL at 06/24/23 1008   LORazepam (ATIVAN) tablet 2 mg  2 mg Oral TID PRN Mcneil Sober, NP       Or   LORazepam (ATIVAN) injection 2 mg  2 mg Intramuscular TID PRN Penn, Cranston Neighbor, NP       magnesium hydroxide (MILK OF MAGNESIA) suspension 30 mL  30 mL Oral Daily PRN Penn, Cicely, NP       mirtazapine (REMERON) tablet 7.5 mg  7.5 mg Oral QHS Sarina Ill, DO   7.5 mg at 06/23/23 2121   montelukast (SINGULAIR) tablet 10 mg  10 mg Oral QHS Sarina Ill, DO   10 mg at 06/23/23 2135   pravastatin (PRAVACHOL) tablet 20 mg  20 mg Oral q1800 Sarina Ill, DO   20 mg at 06/23/23 1832   QUEtiapine (SEROQUEL) tablet 400 mg  400 mg Oral QHS Sarina Ill, DO   400 mg at 06/23/23 2121   traZODone (DESYREL) tablet 200 mg  200 mg Oral QHS PRN Charm Rings, NP   200 mg  at 06/23/23 2121   umeclidinium bromide (INCRUSE ELLIPTA) 62.5 MCG/ACT 1 puff  1 puff Inhalation Daily Sarina Ill, DO   1 puff at 06/24/23 0757   PTA Medications: Medications Prior to Admission  Medication Sig Dispense Refill Last Dose   albuterol (VENTOLIN HFA) 108 (90 Base) MCG/ACT inhaler Inhale 2 puffs into the lungs every 4 (four) hours as needed for wheezing or shortness of breath.      aspirin EC 81 MG tablet Take 81 mg by mouth daily. Swallow whole.      clopidogrel (PLAVIX) 75 MG tablet Take 1 tablet (75 mg total) by mouth daily. 30 tablet 3    dextromethorphan-guaiFENesin (ROBITUSSIN-DM) 10-100 MG/5ML liquid Take 10 mLs by mouth every 6 (six) hours as needed for cough.      diltiazem (CARDIZEM CD) 240 MG 24 hr capsule Take 1 capsule (240 mg total) by mouth daily. 30 capsule 3    docusate sodium (COLACE) 100 MG capsule Take 100 mg by mouth 2 (two) times daily as needed for mild constipation.      [EXPIRED] doxycycline (VIBRA-TABS) 100 MG tablet Take 1  tablet (100 mg total) by mouth every 12 (twelve) hours for 3 days.      famotidine (PEPCID) 20 MG tablet Take 1 tablet (20 mg total) by mouth daily. 30 tablet 0    Fluticasone-Umeclidin-Vilant (TRELEGY ELLIPTA) 100-62.5-25 MCG/ACT AEPB Inhale 1 puff into the lungs daily. 28 each 1    guaiFENesin (MUCINEX) 600 MG 12 hr tablet Take 600 mg by mouth 2 (two) times daily as needed.      ipratropium-albuterol (DUONEB) 0.5-2.5 (3) MG/3ML SOLN Take 3 mLs by nebulization 3 (three) times daily. 270 mL 0    lovastatin (MEVACOR) 20 MG tablet Take 20 mg by mouth at bedtime.      mirtazapine (REMERON) 30 MG tablet Take 1 tablet (30 mg total) by mouth at bedtime. 30 tablet 3    montelukast (SINGULAIR) 10 MG tablet Take 1 tablet (10 mg total) by mouth at bedtime. 30 tablet 1    paliperidone (INVEGA) 9 MG 24 hr tablet Take 1 tablet (9 mg total) by mouth at bedtime. 30 tablet 3    [EXPIRED] predniSONE (DELTASONE) 20 MG tablet Take 2 tablets (40 mg total) by mouth daily with breakfast for 3 days.      prochlorperazine (COMPAZINE) 10 MG tablet Take 1 tablet (10 mg total) by mouth every 6 (six) hours as needed for nausea or vomiting. 90 tablet 0    traZODone (DESYREL) 150 MG tablet Take 1 tablet (150 mg total) by mouth at bedtime as needed for sleep. 30 tablet 3     Patient Stressors: Health problems   Other: AVH experiences    Patient Strengths: Communication skills  Supportive family/friends   Treatment Modalities: Medication Management, Group therapy, Case management,  1 to 1 session with clinician, Psychoeducation, Recreational therapy.   Physician Treatment Plan for Primary Diagnosis: Undifferentiated schizophrenia (HCC) Long Term Goal(s): Improvement in symptoms so as ready for discharge   Short Term Goals: Ability to identify changes in lifestyle to reduce recurrence of condition will improve Ability to verbalize feelings will improve Ability to disclose and discuss suicidal ideas Ability to  demonstrate self-control will improve Ability to identify and develop effective coping behaviors will improve Ability to maintain clinical measurements within normal limits will improve Compliance with prescribed medications will improve Ability to identify triggers associated with substance abuse/mental health issues will improve  Medication Management: Evaluate patient's  response, side effects, and tolerance of medication regimen.  Therapeutic Interventions: 1 to 1 sessions, Unit Group sessions and Medication administration.  Evaluation of Outcomes: Progressing  Physician Treatment Plan for Secondary Diagnosis: Principal Problem:   Undifferentiated schizophrenia (HCC) Active Problems:   COPD with acute exacerbation (HCC)   Essential hypertension   Dyslipidemia   Anxiety and depression  Long Term Goal(s): Improvement in symptoms so as ready for discharge   Short Term Goals: Ability to identify changes in lifestyle to reduce recurrence of condition will improve Ability to verbalize feelings will improve Ability to disclose and discuss suicidal ideas Ability to demonstrate self-control will improve Ability to identify and develop effective coping behaviors will improve Ability to maintain clinical measurements within normal limits will improve Compliance with prescribed medications will improve Ability to identify triggers associated with substance abuse/mental health issues will improve     Medication Management: Evaluate patient's response, side effects, and tolerance of medication regimen.  Therapeutic Interventions: 1 to 1 sessions, Unit Group sessions and Medication administration.  Evaluation of Outcomes: Progressing   RN Treatment Plan for Primary Diagnosis: Undifferentiated schizophrenia (HCC) Long Term Goal(s): Knowledge of disease and therapeutic regimen to maintain health will improve  Short Term Goals: Ability to remain free from injury will improve, Ability to  verbalize frustration and anger appropriately will improve, Ability to demonstrate self-control, Ability to participate in decision making will improve, Ability to verbalize feelings will improve, Ability to disclose and discuss suicidal ideas, Ability to identify and develop effective coping behaviors will improve, and Compliance with prescribed medications will improve  Medication Management: RN will administer medications as ordered by provider, will assess and evaluate patient's response and provide education to patient for prescribed medication. RN will report any adverse and/or side effects to prescribing provider.  Therapeutic Interventions: 1 on 1 counseling sessions, Psychoeducation, Medication administration, Evaluate responses to treatment, Monitor vital signs and CBGs as ordered, Perform/monitor CIWA, COWS, AIMS and Fall Risk screenings as ordered, Perform wound care treatments as ordered.  Evaluation of Outcomes: Progressing   LCSW Treatment Plan for Primary Diagnosis: Undifferentiated schizophrenia (HCC) Long Term Goal(s): Safe transition to appropriate next level of care at discharge, Engage patient in therapeutic group addressing interpersonal concerns.  Short Term Goals: Engage patient in aftercare planning with referrals and resources, Increase social support, Increase ability to appropriately verbalize feelings, Increase emotional regulation, Facilitate acceptance of mental health diagnosis and concerns, and Increase skills for wellness and recovery  Therapeutic Interventions: Assess for all discharge needs, 1 to 1 time with Social worker, Explore available resources and support systems, Assess for adequacy in community support network, Educate family and significant other(s) on suicide prevention, Complete Psychosocial Assessment, Interpersonal group therapy.  Evaluation of Outcomes: Progressing   Progress in Treatment: Attending groups: No. Participating in groups:  No. Taking medication as prescribed: Yes. Toleration medication: Yes. Family/Significant other contact made: Yes, individual(s) contacted:  Franchot Erichsen, [facility] Manager , [phone] Patient understands diagnosis: Yes. Discussing patient identified problems/goals with staff: Yes. Medical problems stabilized or resolved: Yes. Denies suicidal/homicidal ideation: No. Issues/concerns per patient self-inventory: No. Other: none   New problem(s) identified: No, Describe: None Update 06/24/23: No changes at this time.   New Short Term/Long Term Goal(s):  elimination of symptoms of psychosis, medication management for mood stabilization; elimination of SI thoughts; development of comprehensive mental wellness plan. Update 06/24/23: No changes at this time.     Patient Goals:  "Get sleep"  Update 06/24/23: No changes at this  time.     Discharge Plan or Barriers: CSW to assist with discharge planning Update 06/24/23: No changes at this time.     Reason for Continuation of Hospitalization: Medication stabilization   Estimated Length of Stay: 1 to 7 days  Update 06/24/23: No changes at this time.    Last 3 [place] Suicide Severity Risk Score: Flowsheet Row Admission (Current) from 06/16/2023 in [facility] ED to Hosp-Admission (Discharged) from 06/13/2023 in [facility] to Hosp-Admission (Discharged) from 05/25/2023 in [facility] PCU  C-SSRS RISK CATEGORY Low Risk No Risk No Risk       Last PHQ 2/9 Scores:    02/03/2023   11:49 AM  Depression screen PHQ 2/9  Decreased Interest 0  Down, Depressed, Hopeless 0  PHQ - 2 Score 0    Scribe for Treatment Team: Marshell Levan, LCSW 06/24/2023 1:30 PM

## 2023-06-24 NOTE — Plan of Care (Signed)
  Problem: Physical Regulation: Goal: Ability to maintain clinical measurements within normal limits will improve Outcome: Not Progressing   Problem: Safety: Goal: Periods of time without injury will increase Outcome: Progressing

## 2023-06-24 NOTE — Group Note (Signed)
Date:  06/24/2023 Time:  11:24 AM  Group Topic/Focus:  Healthy Communication:   The focus of this group is to discuss communication, barriers to communication, as well as healthy ways to communicate with others.    Participation Level:  Did Not Attend   Ardelle Anton 06/24/2023, 11:24 AM

## 2023-06-24 NOTE — Progress Notes (Signed)
[facility] MD Progress Note  06/24/2023  [name]  MRN:  [id number]  Pt reports presenting to [facility] with police for "hearing voices, haven't slept in 4 days". Reports poor sleep and appetite. Reports worsening auditory hallucinations for the past 2 days. Auditory hallucinations are command in nature and tell him to "kill myself, to cut my throat", to "hurt people in the house, jump on them".   Subjective: Chart reviewed, case discussed in multidisciplinary meeting today, patient seen during rounds.  No new acute events reported overnight.  Patient reports he feels "fine" today. Patient reports his breathing is better.  Patient reports that" Annice Pih" from group home came on the unit to assess patient.  Patient reports his understanding is that Annice Pih mention patient" came come back" to group home Monday.  Patient reports he slept better last night.  Patient was encouraged to attend group and work on coping strategies.   Principal Problem: Undifferentiated schizophrenia (HCC) Diagnosis: Principal Problem:   Undifferentiated schizophrenia (HCC) Active Problems:   COPD with acute exacerbation (HCC)   Essential hypertension   Dyslipidemia   Anxiety and depression    Past Psychiatric History: Schizophrenia   Past Medical History:  Past Medical History:  Diagnosis Date   Acute on chronic respiratory failure with hypoxia (HCC)    Asthma    Coagulation disorder (HCC)    COPD (chronic obstructive pulmonary disease) (HCC)    Depression    History of hiatal hernia    Hypertension    Hypokalemia    Leukocytosis    Lung mass    Pneumonia    Schizophrenia (HCC)    Shortness of breath dyspnea    Stroke [facility])    Umbilical hernia    Ventral hernia     Past Surgical History:  Procedure Laterality Date   COLONOSCOPY WITH PROPOFOL N/A 09/21/2021   Procedure: COLONOSCOPY WITH PROPOFOL;  Surgeon: Regis Bill, MD;  Location: [facility] ENDOSCOPY;  Service: Endoscopy;  Laterality: N/A;   HERNIA  REPAIR     INSERTION OF MESH N/A 12/18/2014   Procedure: INSERTION OF MESH;  Surgeon: Lattie Haw, MD;  Location: [facility] ORS;  Service: General;  Laterality: N/A;   SUPRA-UMBILICAL HERNIA  12/18/2014   Procedure: SUPRA-UMBILICAL HERNIA;  Surgeon: Lattie Haw, MD;  Location: [facility] ORS;  Service: General;;   UMBILICAL HERNIA REPAIR N/A 12/18/2014   Procedure: HERNIA REPAIR UMBILICAL ADULT;  Surgeon: Lattie Haw, MD;  Location: [facility] ORS;  Service: General;  Laterality: N/A;   VIDEO BRONCHOSCOPY WITH ENDOBRONCHIAL ULTRASOUND N/A 03/15/2023   Procedure: VIDEO BRONCHOSCOPY WITH ENDOBRONCHIAL ULTRASOUND;  Surgeon: Vida Rigger, MD;  Location: [facility] ORS;  Service: Thoracic;  Laterality: N/A;   Family History:  Family History  Problem Relation Age of Onset   Asthma Mother    Hypertension Father     Social History:  Social History   Substance and Sexual Activity  Alcohol Use Not Currently   Alcohol/week: 75.0 standard drinks of alcohol   Types: 75 Cans of beer per week     Social History   Substance and Sexual Activity  Drug Use Not Currently   Types: Marijuana, "Crack" cocaine   Comment: none in 15 yrs    Social History   Socioeconomic History   Marital status: Widowed    Spouse name: Not on file   Number of children: Not on file   Years of education: Not on file   Highest education level: Not on file  Occupational History  Not on file  Tobacco Use   Smoking status: Every Day    Current packs/day: 0.15    Average packs/day: 0.2 packs/day for 45.0 years (6.8 ttl pk-yrs)    Types: Cigarettes   Smokeless tobacco: Never  Vaping Use   Vaping status: Never Used  Substance and Sexual Activity   Alcohol use: Not Currently    Alcohol/week: 75.0 standard drinks of alcohol    Types: 75 Cans of beer per week   Drug use: Not Currently    Types: Marijuana, "Crack" cocaine    Comment: none in 15 yrs   Sexual activity: Not Currently  Other Topics Concern   Not on file   Social History Narrative   Not on file   Social Determinants of Health   Financial Resource Strain: Not on file  Food Insecurity: No Food Insecurity (06/16/2023)   Hunger Vital Sign    Worried About Running Out of Food in the Last Year: Never true    Ran Out of Food in the Last Year: Never true  Transportation Needs: No Transportation Needs (06/16/2023)   PRAPARE - Administrator, Civil Service (Medical): No    Lack of Transportation (Non-Medical): No  Physical Activity: Not on file  Stress: Not on file  Social Connections: Not on file                          Sleep: Improved Appetite:  Fair  Current Medications: Current Facility-Administered Medications  Medication Dose Route Frequency Provider Last Rate Last Admin   acetaminophen (TYLENOL) tablet 650 mg  650 mg Oral Q6H PRN Penn, Cicely, NP       alum & mag hydroxide-simeth (MAALOX/MYLANTA) 200-200-20 MG/5ML suspension 30 mL  30 mL Oral Q4H PRN Penn, Cranston Neighbor, NP       aspirin EC tablet 81 mg  81 mg Oral Daily Sarina Ill, DO   81 mg at 06/24/23 1007   chlorpheniramine-HYDROcodone (TUSSIONEX) 10-8 MG/5ML suspension 5 mL  5 mL Oral Q12H PRN Mansy, Jan A, MD       clopidogrel (PLAVIX) tablet 75 mg  75 mg Oral Daily Sarina Ill, DO   75 mg at 06/24/23 1007   diltiazem (CARDIZEM CD) 24 hr capsule 240 mg  240 mg Oral Daily Sarina Ill, DO   240 mg at 06/24/23 1007   diphenhydrAMINE (BENADRYL) capsule 50 mg  50 mg Oral TID PRN Mcneil Sober, NP       Or   diphenhydrAMINE (BENADRYL) injection 50 mg  50 mg Intramuscular TID PRN Mcneil Sober, NP       famotidine (PEPCID) tablet 20 mg  20 mg Oral Daily Sarina Ill, DO   20 mg at 06/24/23 1007   FLUoxetine (PROZAC) capsule 20 mg  20 mg Oral Daily Sarina Ill, DO   20 mg at 06/24/23 1007   guaiFENesin (MUCINEX) 12 hr tablet 600 mg  600 mg Oral BID Mansy, Jan A, MD   600 mg at 06/24/23 1007   haloperidol (HALDOL)  tablet 5 mg  5 mg Oral TID PRN Mcneil Sober, NP       Or   haloperidol lactate (HALDOL) injection 5 mg  5 mg Intramuscular TID PRN Penn, Cranston Neighbor, NP       hydrOXYzine (ATARAX) tablet 10 mg  10 mg Oral Q6H PRN Lewanda Rife, MD   10 mg at 06/23/23 0845   ipratropium-albuterol (DUONEB) 0.5-2.5 (3) MG/3ML  nebulizer solution 3 mL  3 mL Nebulization TID Enedina Finner, MD   3 mL at 06/24/23 1008   LORazepam (ATIVAN) tablet 2 mg  2 mg Oral TID PRN Mcneil Sober, NP       Or   LORazepam (ATIVAN) injection 2 mg  2 mg Intramuscular TID PRN Mcneil Sober, NP       magnesium hydroxide (MILK OF MAGNESIA) suspension 30 mL  30 mL Oral Daily PRN Penn, Cranston Neighbor, NP       mirtazapine (REMERON) tablet 7.5 mg  7.5 mg Oral QHS Sarina Ill, DO   7.5 mg at 06/23/23 2121   montelukast (SINGULAIR) tablet 10 mg  10 mg Oral QHS Sarina Ill, DO   10 mg at 06/23/23 2135   pravastatin (PRAVACHOL) tablet 20 mg  20 mg Oral q1800 Sarina Ill, DO   20 mg at 06/23/23 1832   QUEtiapine (SEROQUEL) tablet 400 mg  400 mg Oral QHS Sarina Ill, DO   400 mg at 06/23/23 2121   traZODone (DESYREL) tablet 200 mg  200 mg Oral QHS PRN Charm Rings, NP   200 mg at 06/23/23 2121   umeclidinium bromide (INCRUSE ELLIPTA) 62.5 MCG/ACT 1 puff  1 puff Inhalation Daily Sarina Ill, DO   1 puff at 06/24/23 0757    Lab Results: No results found for this or any previous visit (from the past 48 hour(s)).  Blood Alcohol level:  Lab Results  Component Value Date   ETH <10 06/13/2023   ETH <10 03/24/2023    Metabolic Disorder Labs: Lab Results  Component Value Date   HGBA1C 5.6 06/17/2023   MPG 114.02 06/17/2023   MPG 119.76 03/26/2023   No results found for: "PROLACTIN" Lab Results  Component Value Date   CHOL 172 06/17/2023   TRIG 102 06/17/2023   HDL 78 06/17/2023   CHOLHDL 2.2 06/17/2023   VLDL 20 06/17/2023   LDLCALC 74 06/17/2023   LDLCALC 51 03/26/2023      Musculoskeletal: Strength & Muscle Tone: within normal limits Gait & Station: normal Patient leans: N/A  Psychiatric Specialty Exam:  Presentation  General Appearance:  Appropriate for Environment  Eye Contact: Fair  Speech: Clear and Coherent  Speech Volume: Soft  Handedness: Right   Mood and Affect  Mood: Fine  Affect: Less cionstricted   Thought Process  Thought Processes: Coherent; Goal Directed; Linear  Descriptions of Associations:Intact  Orientation:Full (Time, Place and Person)  Thought Content:Logical  History of Schizophrenia/Schizoaffective disorder:Yes  Duration of Psychotic Symptoms:Greater than six months  Hallucinations:Denies Ideas of Reference:No  Suicidal Thoughts: Denies suicidal thoughts  Homicidal Thoughts:Denies  Sensorium  Memory: Immediate Fair  Judgment: Intact  Insight: Present   Executive Functions  Concentration: Fair  Attention Span: Fair  Fund of Knowledge: Fair  Language: Fair   Psychomotor Activity  Psychomotor Activity:Decreased  Assets  Assets: Manufacturing systems engineer; Desire for Improvement; Housing   Sleep  Sleep:Fair   Physical Exam: Physical Exam Constitutional:      Appearance: Normal appearance.  HENT:     Head: Normocephalic and atraumatic.     Nose: No congestion.  Cardiovascular:     Pulses: Normal pulses.  Skin:    General: Skin is warm.  Neurological:     Mental Status: He is alert and oriented to person, place, and time.    Review of Systems  Constitutional:  Negative for chills and fever.  HENT:  Negative for congestion and hearing loss.  Eyes:  Negative for blurred vision.  Respiratory:  Negative for shortness of breath.   Cardiovascular:  Negative for chest pain and palpitations.  Gastrointestinal:  Negative for nausea and vomiting.  Neurological:  Negative for dizziness and speech change.   Blood pressure 105/65, pulse 99, temperature 98.6 F (37  C), resp. rate 18, height 6\' 1"  (1.854 m), weight 100.2 kg, SpO2 96%. Body mass index is 29.16 kg/m.   Treatment Plan Summary: Daily contact with patient to assess and evaluate symptoms and progress in treatment and Medication management  Continue on current treatment Hospitalist team following patient for medical needs including COPD Patient is on quetiapine 200 mg at bedtime and trazodone 50 mg to help with depression and insomnia.  Patient was encouraged to stay out of his room during daytime and not take naps during daytime. Sw has contacted the group home supervisor to evaluate the patient for baseline and discharge.   Lewanda Rife, MD

## 2023-06-24 NOTE — Progress Notes (Signed)
   06/24/23 0615  15 Minute Checks  Location Bedroom  Visual Appearance Calm  Behavior Sleeping  Sleep (Behavioral Health Patients Only)  Calculate sleep? (Click Yes once per 24 hr at 0600 safety check) Yes  Documented sleep last 24 hours 14

## 2023-06-24 NOTE — Group Note (Signed)
Date:  06/24/2023 Time:  8:39 PM  Group Topic/Focus:  Wrap-Up Group:   The focus of this group is to help patients review their daily goal of treatment and discuss progress on daily workbooks.    Participation Level:  Minimal  Participation Quality:  Appropriate and Attentive  Affect:  Appropriate  Cognitive:  Alert  Insight: Good  Engagement in Group:  Limited  Modes of Intervention:  Discussion  Additional Comments:     Maglione,Ida Milbrath E 06/24/2023, 8:39 PM

## 2023-06-24 NOTE — Group Note (Signed)
Date:  06/23/2023 Time:  08:00 PM  Group Topic/Focus:  Overcoming Stress:   The focus of this group is to define stress and help patients assess their triggers.    Participation Level:  Active  Participation Quality:  Appropriate  Affect:  Appropriate  Cognitive:  Appropriate  Insight: Appropriate  Engagement in Group:  Engaged  Modes of Intervention:  Education  Additional Comments:    Garry Heater 06/24/2023, 5:53 AM

## 2023-06-24 NOTE — Plan of Care (Signed)
Patient alert and oriented.  Blunted affect. Visible in the dayroom, watching tv. Limited interaction with peers and staff. States he's OK.  Mini Neb tx given as scheduled for SOB/cough. No s/s of resp distress noted. C/o difficulty sleeping. PRN given as ordered for insomnia. Tol well. Q 15 min checks maintained for safety. No behavior issues noted. Denies SI, HI, AVH, and pain. Patient remains safe at this time.  Problem: Coping: Goal: Ability to identify and develop effective coping behavior will improve Outcome: Progressing   Problem: Self-Concept: Goal: Ability to identify factors that promote anxiety will improve Outcome: Progressing Goal: Level of anxiety will decrease Outcome: Progressing Goal: Ability to modify response to factors that promote anxiety will improve Outcome: Progressing

## 2023-06-24 NOTE — Group Note (Signed)
LCSW Group Therapy Note  06/24/2023    1:00 - 2:00 PM               Type of Therapy and Topic:  Group Therapy: Understanding the differences between fear and anxiety / Fear Ladder & Examining the Evidence  Participation Level:  Did not attend   Description of Group:   In this group session, patients learned how to define and recognize the similarities differences between fear and anxiety. Patients will explore reactions we have to fear and anxiety. Patients identified a fear or something they feel anxious about. Patients analyzed scenarios and discussed both the positive and negative aspects to them. Patients were asked to identify if it is a fear or anxiety as well.  Patients were asked to provide their own examples. Patients will learn what to do for both fear and anxiety. CSW provided tools such as breaking things up into smaller steps, challenging automatic negative thinking / questions to ask, fear ladder and how to use gradual exposure. Patients will be asked to practice each tool with situations and to complete a fear ladder for their personal gain.   Therapeutic Goals: Patients will learn the difference between fear versus anxiety and the definitions of both. Patients will utilize scenarios and be able to identify if this is an example of a fear or anxiety. Patients will learn tools to handle both their fears and anxieties as well as discuss breaking it into smaller steps and exposure.  Patients will be asked to provide examples of their own and discuss how things have both positive and negative aspects.  Patients were provided questions to ask to challenge automatic negative thinking for anxiety.  Patients were provided a sample form of a fear ladder and asked to complete one for a fear.   Summary of Patient Progress: Did not attend.  Therapeutic Modalities:   Cognitive Behavioral Therapy Motivational Interviewing  Brief Therapy  Gardiner Sleeper Alaric Gladwin, LCSW  06/24/2023 2:01 PM

## 2023-06-25 DIAGNOSIS — J441 Chronic obstructive pulmonary disease with (acute) exacerbation: Secondary | ICD-10-CM | POA: Diagnosis not present

## 2023-06-25 MED ORDER — ALBUTEROL SULFATE HFA 108 (90 BASE) MCG/ACT IN AERS
1.0000 | INHALATION_SPRAY | Freq: Four times a day (QID) | RESPIRATORY_TRACT | Status: DC | PRN
Start: 2023-06-25 — End: 2023-07-04
  Administered 2023-06-26: 2 via RESPIRATORY_TRACT
  Administered 2023-06-27 – 2023-07-02 (×2): 1 via RESPIRATORY_TRACT

## 2023-06-25 NOTE — Plan of Care (Signed)
D: Pt alert and oriented. Pt denies experiencing any anxiety/depression at this time. Pt denies experiencing any pain at this time. Pt denies experiencing any SI/HI, or AVH at this time.   A: Scheduled medications administered to pt, per MD orders. Support and encouragement provided. Frequent verbal contact made. Routine safety checks conducted q15 minutes.   R: No adverse drug reactions noted. Pt verbally contracts for safety at this time. Pt compliant with medications and treatment plan. Pt interacts well but minimally with others on the unit, mostly self isolating to room. Pt remains safe at this time. Plan of care ongoing.  Problem: Education: Goal: Emotional status will improve Outcome: Progressing Goal: Mental status will improve Outcome: Progressing

## 2023-06-25 NOTE — Group Note (Unsigned)
Date:  06/25/2023 Time:  10:02 AM  Group Topic/Focus:  Movement therapy     Participation Level:  {BHH PARTICIPATION LEVEL:22264}  Participation Quality:  {BHH PARTICIPATION QUALITY:22265}  Affect:  {BHH AFFECT:22266}  Cognitive:  {BHH COGNITIVE:22267}  Insight: {BHH Insight2:20797}  Engagement in Group:  {BHH ENGAGEMENT IN XNATF:57322}  Modes of Intervention:  {BHH MODES OF INTERVENTION:22269}  Additional Comments:  ***  [name] 06/25/2023, 10:02 AM

## 2023-06-25 NOTE — Progress Notes (Signed)
[facility]  -  at [facility]   PATIENT NAME: [name]    MR#:  [id number]  DATE OF BIRTH:  1961/05/30  SUBJECTIVE:  called by psych RN to evaluate patient regarding wheezing. Received breathing treatment earlier. When I went down to general psych patient was in the dining hall eating his meal. He said he is feeling a bit better. No wheezing noted. Appeared comfortable. After finishing his meal he walked to the phone in the hallway and was calling family member. No respiratory distress noted.  VITALS:  Blood pressure (!) 166/87, pulse (!) 129, temperature 98.3 F (36.8 C), resp. rate 20, height 6\' 1"  (1.854 m), weight 100.2 kg, SpO2 99%.  PHYSICAL EXAMINATION:   GENERAL:  62 y.o.-year-old patient with no acute distress. Obese LUNGS: Normal breath sounds bilaterally, scattered wheezing CARDIOVASCULAR: S1, S2 normal. No murmur   ABDOMEN: Soft, nontender, nondistended. Bowel sounds present.  EXTREMITIES: No  edema b/l.    NEUROLOGIC: nonfocal  patient is alert and awake    Assessment and Plan  [name] is a 62 y.o. male e with medical history significant of COPD Gold stage II in 2015, HTN, HLD, anxiety/depression, schizophrenia, left upper lung mass, who was recently discharged from the hospital on 06/16/2023 after hospitalization for COPD exacerbation.  He was discharged to the behavioral health unit because of auditory hallucinations and suicidal thoughts.  He was discharged on prednisone and doxycycline. The hospitalist team was consulted on 06/20/2023 for COPD exacerbation.  COPD/asthma exacerbation: -- patient completed PO prednisone. No indication for antibiotic. Continue home dose of nebulizer three times a day and oral inhalers. Currently not wheezing. Sats more than 92% on room air. He is advised to see a pulmonologist. He gets his meds through [facility] -- patient completed eight days of steroids. Currently he appears stable. On  ambulation his pulse ox remains more than 91%. -- Discussed with RN to give him oral inhaler for breakthrough along with incentive spirometer and flutter valve if he uses. -- Call if needed.  Hypertension: Continue Cardizem.    History of stroke: Continue aspirin and Plavix    Schizophrenia, anxiety and depression:per psych psychiatrist.     Comorbidities include hyperlipidemia, left upper lung mass  Patient's RN informed  TOTAL TIME TAKING CARE OF THIS PATIENT: 35 minutes.  >50% time spent on counselling and coordination of care  Note: This dictation was prepared with Dragon dictation along with smaller phrase technology. Any transcriptional errors that result from this process are unintentional.  Enedina Finner M.D    [facility]   CC: Primary care physician; Emogene Morgan, MD

## 2023-06-25 NOTE — Group Note (Unsigned)
Date:  06/25/2023 Time:  9:59 AM  Group Topic/Focus:  Movement Therapy     Participation Level:  {BHH PARTICIPATION NFAOZ:30865}  Participation Quality:  {BHH PARTICIPATION QUALITY:22265}  Affect:  {BHH AFFECT:22266}  Cognitive:  {BHH COGNITIVE:22267}  Insight: {BHH Insight2:20797}  Engagement in Group:  {BHH ENGAGEMENT IN HQION:62952}  Modes of Intervention:  {BHH MODES OF INTERVENTION:22269}  Additional Comments:  ***  [name] 06/25/2023, 9:59 AM

## 2023-06-25 NOTE — Group Note (Unsigned)
Date:  06/25/2023 Time:  10:04 AM  Group Topic/Focus:  Movement Therapy     Participation Level:  {BHH PARTICIPATION WUJWJ:19147}  Participation Quality:  {BHH PARTICIPATION QUALITY:22265}  Affect:  {BHH AFFECT:22266}  Cognitive:  {BHH COGNITIVE:22267}  Insight: {BHH Insight2:20797}  Engagement in Group:  {BHH ENGAGEMENT IN WGNFA:21308}  Modes of Intervention:  {BHH MODES OF INTERVENTION:22269}  Additional Comments:  ***  [name] 06/25/2023, 10:04 AM

## 2023-06-25 NOTE — Progress Notes (Signed)
[facility] MD Progress Note  06/25/2023  [name]  MRN:  [id number]  Pt reports presenting to [facility] with police for "hearing voices, haven't slept in 4 days". Reports poor sleep and appetite. Reports worsening auditory hallucinations for the past 2 days. Auditory hallucinations are command in nature and tell him to "kill myself, to cut my throat", to "hurt people in the house, jump on them".   Subjective: Chart reviewed, case discussed in multidisciplinary meeting today, patient seen during rounds.  Staff reports that today patient has been complaining of shortness of breath.  Patient's sats dropped to low 90s.  Patient received nebulizer treatment.  Patient reports that he is feeling better after the treatment.  Hospitalist, Dr. Allena Katz has been notified.  Patient denies thoughts of harming self or others.  Patient reports he slept better last night.  Patient was encouraged to attend group and work on coping strategies.   Principal Problem: Undifferentiated schizophrenia (HCC) Diagnosis: Principal Problem:   Undifferentiated schizophrenia (HCC) Active Problems:   COPD with acute exacerbation (HCC)   Essential hypertension   Dyslipidemia   Anxiety and depression    Past Psychiatric History: Schizophrenia   Past Medical History:  Past Medical History:  Diagnosis Date   Acute on chronic respiratory failure with hypoxia (HCC)    Asthma    Coagulation disorder (HCC)    COPD (chronic obstructive pulmonary disease) (HCC)    Depression    History of hiatal hernia    Hypertension    Hypokalemia    Leukocytosis    Lung mass    Pneumonia    Schizophrenia (HCC)    Shortness of breath dyspnea    Stroke [facility])    Umbilical hernia    Ventral hernia     Past Surgical History:  Procedure Laterality Date   COLONOSCOPY WITH PROPOFOL N/A 09/21/2021   Procedure: COLONOSCOPY WITH PROPOFOL;  Surgeon: Regis Bill, MD;  Location: [facility] ENDOSCOPY;  Service: Endoscopy;  Laterality: N/A;    HERNIA REPAIR     INSERTION OF MESH N/A 12/18/2014   Procedure: INSERTION OF MESH;  Surgeon: Lattie Haw, MD;  Location: [facility] ORS;  Service: General;  Laterality: N/A;   SUPRA-UMBILICAL HERNIA  12/18/2014   Procedure: SUPRA-UMBILICAL HERNIA;  Surgeon: Lattie Haw, MD;  Location: [facility] ORS;  Service: General;;   UMBILICAL HERNIA REPAIR N/A 12/18/2014   Procedure: HERNIA REPAIR UMBILICAL ADULT;  Surgeon: Lattie Haw, MD;  Location: [facility] ORS;  Service: General;  Laterality: N/A;   VIDEO BRONCHOSCOPY WITH ENDOBRONCHIAL ULTRASOUND N/A 03/15/2023   Procedure: VIDEO BRONCHOSCOPY WITH ENDOBRONCHIAL ULTRASOUND;  Surgeon: Vida Rigger, MD;  Location: [facility] ORS;  Service: Thoracic;  Laterality: N/A;   Family History:  Family History  Problem Relation Age of Onset   Asthma Mother    Hypertension Father     Social History:  Social History   Substance and Sexual Activity  Alcohol Use Not Currently   Alcohol/week: 75.0 standard drinks of alcohol   Types: 75 Cans of beer per week     Social History   Substance and Sexual Activity  Drug Use Not Currently   Types: Marijuana, "Crack" cocaine   Comment: none in 15 yrs    Social History   Socioeconomic History   Marital status: Widowed    Spouse name: Not on file   Number of children: Not on file   Years of education: Not on file   Highest education level: Not on file  Occupational History  Not on file  Tobacco Use   Smoking status: Every Day    Current packs/day: 0.15    Average packs/day: 0.2 packs/day for 45.0 years (6.8 ttl pk-yrs)    Types: Cigarettes   Smokeless tobacco: Never  Vaping Use   Vaping status: Never Used  Substance and Sexual Activity   Alcohol use: Not Currently    Alcohol/week: 75.0 standard drinks of alcohol    Types: 75 Cans of beer per week   Drug use: Not Currently    Types: Marijuana, "Crack" cocaine    Comment: none in 15 yrs   Sexual activity: Not Currently  Other Topics Concern   Not on  file  Social History Narrative   Not on file   Social Determinants of Health   Financial Resource Strain: Not on file  Food Insecurity: No Food Insecurity (06/16/2023)   Hunger Vital Sign    Worried About Running Out of Food in the Last Year: Never true    Ran Out of Food in the Last Year: Never true  Transportation Needs: No Transportation Needs (06/16/2023)   PRAPARE - Administrator, Civil Service (Medical): No    Lack of Transportation (Non-Medical): No  Physical Activity: Not on file  Stress: Not on file  Social Connections: Not on file                          Sleep: Improved Appetite:  Fair  Current Medications: Current Facility-Administered Medications  Medication Dose Route Frequency Provider Last Rate Last Admin   acetaminophen (TYLENOL) tablet 650 mg  650 mg Oral Q6H PRN Penn, Cicely, NP       albuterol (VENTOLIN HFA) 108 (90 Base) MCG/ACT inhaler 1-2 puff  1-2 puff Inhalation Q6H PRN Enedina Finner, MD       alum & mag hydroxide-simeth (MAALOX/MYLANTA) 200-200-20 MG/5ML suspension 30 mL  30 mL Oral Q4H PRN Penn, Cranston Neighbor, NP       aspirin EC tablet 81 mg  81 mg Oral Daily Sarina Ill, DO   81 mg at 06/25/23 1046   chlorpheniramine-HYDROcodone (TUSSIONEX) 10-8 MG/5ML suspension 5 mL  5 mL Oral Q12H PRN Mansy, Jan A, MD       clopidogrel (PLAVIX) tablet 75 mg  75 mg Oral Daily Sarina Ill, DO   75 mg at 06/25/23 1046   diltiazem (CARDIZEM CD) 24 hr capsule 240 mg  240 mg Oral Daily Sarina Ill, DO   240 mg at 06/25/23 1047   diphenhydrAMINE (BENADRYL) capsule 50 mg  50 mg Oral TID PRN Mcneil Sober, NP       Or   diphenhydrAMINE (BENADRYL) injection 50 mg  50 mg Intramuscular TID PRN Mcneil Sober, NP       famotidine (PEPCID) tablet 20 mg  20 mg Oral Daily Sarina Ill, DO   20 mg at 06/25/23 1047   FLUoxetine (PROZAC) capsule 20 mg  20 mg Oral Daily Sarina Ill, DO   20 mg at 06/25/23 1047    guaiFENesin (MUCINEX) 12 hr tablet 600 mg  600 mg Oral BID Mansy, Jan A, MD   600 mg at 06/25/23 1047   haloperidol (HALDOL) tablet 5 mg  5 mg Oral TID PRN Mcneil Sober, NP       Or   haloperidol lactate (HALDOL) injection 5 mg  5 mg Intramuscular TID PRN Mcneil Sober, NP       hydrOXYzine (ATARAX) tablet  10 mg  10 mg Oral Q6H PRN Lewanda Rife, MD   10 mg at 06/25/23 1119   ipratropium-albuterol (DUONEB) 0.5-2.5 (3) MG/3ML nebulizer solution 3 mL  3 mL Nebulization TID Enedina Finner, MD   3 mL at 06/25/23 0847   LORazepam (ATIVAN) tablet 2 mg  2 mg Oral TID PRN Mcneil Sober, NP       Or   LORazepam (ATIVAN) injection 2 mg  2 mg Intramuscular TID PRN Mcneil Sober, NP       magnesium hydroxide (MILK OF MAGNESIA) suspension 30 mL  30 mL Oral Daily PRN Penn, Cranston Neighbor, NP       mirtazapine (REMERON) tablet 7.5 mg  7.5 mg Oral QHS Sarina Ill, DO   7.5 mg at 06/24/23 2113   montelukast (SINGULAIR) tablet 10 mg  10 mg Oral QHS Sarina Ill, DO   10 mg at 06/24/23 2113   pravastatin (PRAVACHOL) tablet 20 mg  20 mg Oral q1800 Sarina Ill, DO   20 mg at 06/24/23 1704   QUEtiapine (SEROQUEL) tablet 400 mg  400 mg Oral QHS Sarina Ill, DO   400 mg at 06/24/23 2113   traZODone (DESYREL) tablet 200 mg  200 mg Oral QHS PRN Charm Rings, NP   200 mg at 06/24/23 2113   umeclidinium bromide (INCRUSE ELLIPTA) 62.5 MCG/ACT 1 puff  1 puff Inhalation Daily Sarina Ill, DO   1 puff at 06/25/23 1610    Lab Results: No results found for this or any previous visit (from the past 48 hour(s)).  Blood Alcohol level:  Lab Results  Component Value Date   ETH <10 06/13/2023   ETH <10 03/24/2023    Metabolic Disorder Labs: Lab Results  Component Value Date   HGBA1C 5.6 06/17/2023   MPG 114.02 06/17/2023   MPG 119.76 03/26/2023   No results found for: "PROLACTIN" Lab Results  Component Value Date   CHOL 172 06/17/2023   TRIG 102 06/17/2023   HDL 78  06/17/2023   CHOLHDL 2.2 06/17/2023   VLDL 20 06/17/2023   LDLCALC 74 06/17/2023   LDLCALC 51 03/26/2023     Musculoskeletal: Strength & Muscle Tone: within normal limits Gait & Station: normal Patient leans: N/A  Psychiatric Specialty Exam:  Presentation  General Appearance:  Appropriate for Environment  Eye Contact: Fair  Speech: Clear and Coherent  Speech Volume: Soft  Handedness: Right   Mood and Affect  Mood: Fine  Affect: Less cionstricted   Thought Process  Thought Processes: Coherent; Goal Directed; Linear  Descriptions of Associations:Intact  Orientation:Full (Time, Place and Person)  Thought Content:Logical  History of Schizophrenia/Schizoaffective disorder:Yes  Duration of Psychotic Symptoms:Greater than six months  Hallucinations:Denies Ideas of Reference:No  Suicidal Thoughts: Denies suicidal thoughts  Homicidal Thoughts:Denies  Sensorium  Memory: Immediate Fair  Judgment: Intact  Insight: Present   Executive Functions  Concentration: Fair  Attention Span: Fair  Fund of Knowledge: Fair  Language: Fair   Psychomotor Activity  Psychomotor Activity:Decreased  Assets  Assets: Manufacturing systems engineer; Desire for Improvement; Housing   Sleep  Sleep:Fair   Physical Exam: Physical Exam Constitutional:      Appearance: Normal appearance.  HENT:     Head: Normocephalic and atraumatic.     Nose: No congestion.  Cardiovascular:     Pulses: Normal pulses.  Skin:    General: Skin is warm.  Neurological:     Mental Status: He is alert and oriented to person, place, and time.  Review of Systems  Constitutional:  Negative for chills and fever.  HENT:  Negative for congestion and hearing loss.   Eyes:  Negative for blurred vision.  Respiratory:  Negative for shortness of breath.   Cardiovascular:  Negative for chest pain and palpitations.  Gastrointestinal:  Negative for nausea and vomiting.   Neurological:  Negative for dizziness and speech change.   Blood pressure (!) 166/87, pulse (!) 129, temperature 98.3 F (36.8 C), resp. rate 20, height 6\' 1"  (1.854 m), weight 100.2 kg, SpO2 99%. Body mass index is 29.16 kg/m.   Treatment Plan Summary: Daily contact with patient to assess and evaluate symptoms and progress in treatment and Medication management  Continue on current treatment Hospitalist team following patient for medical needs including COPD Patient is on quetiapine 200 mg at bedtime and trazodone 50 mg to help with depression and insomnia.   Anticipated discharge tomorrow  Lewanda Rife, MD

## 2023-06-25 NOTE — Plan of Care (Signed)
Patient alert and oriented. Blunted affect. C/o SOB. DuoNeb mini neb tx given as scheduled. Tol well. Endorses anxiety. Pt report concerns about his breathing. Support and encouragement provided. No s/s of acute resp distress noted. Limited interaction with peers and staff. No behavior issues noted. Q 15 min checks maintained for safety. Denies SI, HI, AVH, and pain.  Patient remains safe at this time.  Problem: Education: Goal: Ability to state activities that reduce stress will improve Outcome: Progressing   Problem: Self-Concept: Goal: Ability to identify factors that promote anxiety will improve Outcome: Progressing Goal: Level of anxiety will decrease Outcome: Progressing Goal: Ability to modify response to factors that promote anxiety will improve Outcome: Progressing

## 2023-06-26 DIAGNOSIS — J441 Chronic obstructive pulmonary disease with (acute) exacerbation: Secondary | ICD-10-CM | POA: Diagnosis not present

## 2023-06-26 NOTE — Plan of Care (Signed)
°  Problem: Education: °Goal: Emotional status will improve °Outcome: Progressing °Goal: Mental status will improve °Outcome: Progressing °Goal: Verbalization of understanding the information provided will improve °Outcome: Progressing °  °

## 2023-06-26 NOTE — Progress Notes (Addendum)
[facility] MD Progress Note  06/26/2023  [name]  MRN:  [id number]  Pt reports presenting to [facility] with police for "hearing voices, haven't slept in 4 days". Reports poor sleep and appetite. Reports worsening auditory hallucinations for the past 2 days. Auditory hallucinations are command in nature and tell him to "kill myself, to cut my throat", to "hurt people in the house, jump on them".   Subjective: Chart reviewed, case discussed in multidisciplinary meeting today, patient seen during rounds.   Patient reports that he is not feeling good today.  He said that he has started hearing voices this morning.  Reports voices are telling him to kill himself.  This is a change in mental status from yesterday.  Patient also reports shortness of breath.  Patient is followed by hospitalist team.  Patient was provided with support and reassurance.  Patient denies thoughts of harming self or others.  Patient reports he slept fine last night.  Patient was encouraged to attend group and work on coping strategies.   Principal Problem: Undifferentiated schizophrenia (HCC) Diagnosis: Principal Problem:   Undifferentiated schizophrenia (HCC) Active Problems:   COPD with acute exacerbation (HCC)   Essential hypertension   Dyslipidemia   Anxiety and depression    Past Psychiatric History: Schizophrenia   Past Medical History:  Past Medical History:  Diagnosis Date   Acute on chronic respiratory failure with hypoxia (HCC)    Asthma    Coagulation disorder (HCC)    COPD (chronic obstructive pulmonary disease) (HCC)    Depression    History of hiatal hernia    Hypertension    Hypokalemia    Leukocytosis    Lung mass    Pneumonia    Schizophrenia (HCC)    Shortness of breath dyspnea    Stroke [facility])    Umbilical hernia    Ventral hernia     Past Surgical History:  Procedure Laterality Date   COLONOSCOPY WITH PROPOFOL N/A 09/21/2021   Procedure: COLONOSCOPY WITH PROPOFOL;  Surgeon: Regis Bill, MD;  Location: [facility] ENDOSCOPY;  Service: Endoscopy;  Laterality: N/A;   HERNIA REPAIR     INSERTION OF MESH N/A 12/18/2014   Procedure: INSERTION OF MESH;  Surgeon: Lattie Haw, MD;  Location: [facility] ORS;  Service: General;  Laterality: N/A;   SUPRA-UMBILICAL HERNIA  12/18/2014   Procedure: SUPRA-UMBILICAL HERNIA;  Surgeon: Lattie Haw, MD;  Location: [facility] ORS;  Service: General;;   UMBILICAL HERNIA REPAIR N/A 12/18/2014   Procedure: HERNIA REPAIR UMBILICAL ADULT;  Surgeon: Lattie Haw, MD;  Location: [facility] ORS;  Service: General;  Laterality: N/A;   VIDEO BRONCHOSCOPY WITH ENDOBRONCHIAL ULTRASOUND N/A 03/15/2023   Procedure: VIDEO BRONCHOSCOPY WITH ENDOBRONCHIAL ULTRASOUND;  Surgeon: Vida Rigger, MD;  Location: [facility] ORS;  Service: Thoracic;  Laterality: N/A;   Family History:  Family History  Problem Relation Age of Onset   Asthma Mother    Hypertension Father     Social History:  Social History   Substance and Sexual Activity  Alcohol Use Not Currently   Alcohol/week: 75.0 standard drinks of alcohol   Types: 75 Cans of beer per week     Social History   Substance and Sexual Activity  Drug Use Not Currently   Types: Marijuana, "Crack" cocaine   Comment: none in 15 yrs    Social History   Socioeconomic History   Marital status: Widowed    Spouse name: Not on file   Number of children: Not on file  Years of education: Not on file   Highest education level: Not on file  Occupational History   Not on file  Tobacco Use   Smoking status: Every Day    Current packs/day: 0.15    Average packs/day: 0.2 packs/day for 45.0 years (6.8 ttl pk-yrs)    Types: Cigarettes   Smokeless tobacco: Never  Vaping Use   Vaping status: Never Used  Substance and Sexual Activity   Alcohol use: Not Currently    Alcohol/week: 75.0 standard drinks of alcohol    Types: 75 Cans of beer per week   Drug use: Not Currently    Types: Marijuana, "Crack" cocaine    Comment:  none in 15 yrs   Sexual activity: Not Currently  Other Topics Concern   Not on file  Social History Narrative   Not on file   Social Determinants of Health   Financial Resource Strain: Not on file  Food Insecurity: No Food Insecurity (06/16/2023)   Hunger Vital Sign    Worried About Running Out of Food in the Last Year: Never true    Ran Out of Food in the Last Year: Never true  Transportation Needs: No Transportation Needs (06/16/2023)   PRAPARE - Administrator, Civil Service (Medical): No    Lack of Transportation (Non-Medical): No  Physical Activity: Not on file  Stress: Not on file  Social Connections: Not on file                          Sleep: Improved Appetite:  Fair  Current Medications: Current Facility-Administered Medications  Medication Dose Route Frequency Provider Last Rate Last Admin   acetaminophen (TYLENOL) tablet 650 mg  650 mg Oral Q6H PRN Penn, Cicely, NP       albuterol (VENTOLIN HFA) 108 (90 Base) MCG/ACT inhaler 1-2 puff  1-2 puff Inhalation Q6H PRN Enedina Finner, MD   2 puff at 06/26/23 1609   alum & mag hydroxide-simeth (MAALOX/MYLANTA) 200-200-20 MG/5ML suspension 30 mL  30 mL Oral Q4H PRN Penn, Cranston Neighbor, NP       aspirin EC tablet 81 mg  81 mg Oral Daily Sarina Ill, DO   81 mg at 06/26/23 0102   chlorpheniramine-HYDROcodone (TUSSIONEX) 10-8 MG/5ML suspension 5 mL  5 mL Oral Q12H PRN Mansy, Jan A, MD       clopidogrel (PLAVIX) tablet 75 mg  75 mg Oral Daily Sarina Ill, DO   75 mg at 06/26/23 7253   diltiazem (CARDIZEM CD) 24 hr capsule 240 mg  240 mg Oral Daily Sarina Ill, DO   240 mg at 06/26/23 6644   diphenhydrAMINE (BENADRYL) capsule 50 mg  50 mg Oral TID PRN Mcneil Sober, NP       Or   diphenhydrAMINE (BENADRYL) injection 50 mg  50 mg Intramuscular TID PRN Penn, Cranston Neighbor, NP       famotidine (PEPCID) tablet 20 mg  20 mg Oral Daily Sarina Ill, DO   20 mg at 06/26/23 0347    FLUoxetine (PROZAC) capsule 20 mg  20 mg Oral Daily Sarina Ill, DO   20 mg at 06/26/23 4259   guaiFENesin (MUCINEX) 12 hr tablet 600 mg  600 mg Oral BID Mansy, Jan A, MD   600 mg at 06/26/23 2146   haloperidol (HALDOL) tablet 5 mg  5 mg Oral TID PRN Mcneil Sober, NP       Or   haloperidol lactate (  HALDOL) injection 5 mg  5 mg Intramuscular TID PRN Mcneil Sober, NP       hydrOXYzine (ATARAX) tablet 10 mg  10 mg Oral Q6H PRN Lewanda Rife, MD   10 mg at 06/26/23 1734   ipratropium-albuterol (DUONEB) 0.5-2.5 (3) MG/3ML nebulizer solution 3 mL  3 mL Nebulization TID Enedina Finner, MD   3 mL at 06/26/23 2146   LORazepam (ATIVAN) tablet 2 mg  2 mg Oral TID PRN Mcneil Sober, NP       Or   LORazepam (ATIVAN) injection 2 mg  2 mg Intramuscular TID PRN Mcneil Sober, NP       magnesium hydroxide (MILK OF MAGNESIA) suspension 30 mL  30 mL Oral Daily PRN Penn, Cranston Neighbor, NP   30 mL at 06/26/23 0936   mirtazapine (REMERON) tablet 7.5 mg  7.5 mg Oral QHS Sarina Ill, DO   7.5 mg at 06/26/23 2143   montelukast (SINGULAIR) tablet 10 mg  10 mg Oral QHS Sarina Ill, DO   10 mg at 06/26/23 2145   pravastatin (PRAVACHOL) tablet 20 mg  20 mg Oral q1800 Sarina Ill, DO   20 mg at 06/26/23 1734   QUEtiapine (SEROQUEL) tablet 400 mg  400 mg Oral QHS Sarina Ill, DO   400 mg at 06/26/23 2145   traZODone (DESYREL) tablet 200 mg  200 mg Oral QHS PRN Charm Rings, NP   200 mg at 06/26/23 2152   umeclidinium bromide (INCRUSE ELLIPTA) 62.5 MCG/ACT 1 puff  1 puff Inhalation Daily Sarina Ill, DO   1 puff at 06/26/23 1610    Lab Results: No results found for this or any previous visit (from the past 48 hour(s)).  Blood Alcohol level:  Lab Results  Component Value Date   ETH <10 06/13/2023   ETH <10 03/24/2023    Metabolic Disorder Labs: Lab Results  Component Value Date   HGBA1C 5.6 06/17/2023   MPG 114.02 06/17/2023   MPG 119.76 03/26/2023    No results found for: "PROLACTIN" Lab Results  Component Value Date   CHOL 172 06/17/2023   TRIG 102 06/17/2023   HDL 78 06/17/2023   CHOLHDL 2.2 06/17/2023   VLDL 20 06/17/2023   LDLCALC 74 06/17/2023   LDLCALC 51 03/26/2023     Musculoskeletal: Strength & Muscle Tone: within normal limits Gait & Station: normal Patient leans: N/A  Psychiatric Specialty Exam:  Presentation  General Appearance:  Appropriate for Environment  Eye Contact: Fair  Speech: Clear and Coherent  Speech Volume: Soft  Handedness: Right   Mood and Affect  Mood: Fine  Affect: Less cionstricted   Thought Process  Thought Processes: Coherent; Goal Directed; Linear  Descriptions of Associations:Intact  Orientation:Full (Time, Place and Person)  Thought Content:Logical  History of Schizophrenia/Schizoaffective disorder:Yes  Duration of Psychotic Symptoms:Greater than six months  Hallucinations:AH, telling him to kill himself Ideas of Reference:No  Suicidal Thoughts: Denies suicidal thoughts  Homicidal Thoughts:Denies  Sensorium  Memory: Immediate Fair  Judgment: Intact  Insight: Present   Executive Functions  Concentration: Fair  Attention Span: Fair  Fund of Knowledge: Fair  Language: Fair   Psychomotor Activity  Psychomotor Activity:Decreased  Assets  Assets: Manufacturing systems engineer; Desire for Improvement; Housing   Sleep  Sleep:Fair   Physical Exam: Physical Exam Constitutional:      Appearance: Normal appearance.  HENT:     Head: Normocephalic and atraumatic.     Nose: No congestion.  Cardiovascular:  Pulses: Normal pulses.  Skin:    General: Skin is warm.  Neurological:     Mental Status: He is alert and oriented to person, place, and time.    Review of Systems  Constitutional:  Negative for chills and fever.  HENT:  Negative for congestion and hearing loss.   Eyes:  Negative for blurred vision.  Respiratory:  Positive  for shortness of breath.   Cardiovascular:  Negative for chest pain and palpitations.  Gastrointestinal:  Negative for nausea and vomiting.  Neurological:  Negative for dizziness and speech change.   Blood pressure 132/77, pulse (!) 101, temperature 99.1 F (37.3 C), resp. rate 17, height 6\' 1"  (1.854 m), weight 100.2 kg, SpO2 96%. Body mass index is 29.16 kg/m.   Treatment Plan Summary: Daily contact with patient to assess and evaluate symptoms and progress in treatment and Medication management  Continue on current treatment Hospitalist team following patient for medical needs including COPD Patient is on quetiapine 200 mg at bedtime and trazodone 50 mg to help with depression and insomnia.    Patient's discharge was canceled today due to sudden change in mental status  Lewanda Rife, MD

## 2023-06-26 NOTE — Group Note (Signed)
Date:  06/26/2023 Time:  1:57 AM  Group Topic/Focus:  Goals Group:   The focus of this group is to help patients establish daily goals to achieve during treatment and discuss how the patient can incorporate goal setting into their daily lives to aide in recovery.    Participation Level:  Active  Participation Quality:  Appropriate  Affect:  Appropriate  Cognitive:  Appropriate  Insight: Good  Engagement in Group:  Engaged  Modes of Intervention:  Discussion  Additional Comments:    Osker Mason 06/26/2023, 1:57 AM

## 2023-06-26 NOTE — Plan of Care (Signed)
D: Pt alert and oriented. Pt endorses depression however, denies experiencing any anxiety at this time. Pt denies experiencing any pain at this time. Pt denies experiencing any SI/HI, or VH at this time however, endorses experiencing AH. Pt states the voices are telling him to kill himself. Pt states he has no intend on acting on what voices are telling him to do and will tell staff if anything changes.  A: Scheduled medications administered to pt, per MD orders. Support and encouragement provided. Frequent verbal contact made. Routine safety checks conducted q15 minutes.   R: No adverse drug reactions noted. Pt verbally contracts for safety at this time. Pt compliant with medications and treatment plan. Pt interacts well but minimally with others on the unit, mostly self isolating to room. Pt remains safe at this time. Plan of care ongoing.  Pt states he does not feel he is ready to go home. MD notified and aware.   Problem: Education: Goal: Mental status will improve Outcome: Not Progressing   Problem: Activity: Goal: Interest or engagement in activities will improve Outcome: Not Progressing

## 2023-06-26 NOTE — Progress Notes (Signed)
   06/25/23 2000  Psych Admission Type (Psych Patients Only)  Admission Status Involuntary  Psychosocial Assessment  Patient Complaints None  Eye Contact Fair  Facial Expression Blank  Affect Blunted  Speech Logical/coherent  Interaction Minimal  Motor Activity Slow  Appearance/Hygiene In scrubs  Behavior Characteristics Cooperative  Mood Depressed  Thought Process  Coherency WDL  Content WDL  Delusions None reported or observed  Perception WDL  Hallucination None reported or observed  Judgment Impaired  Confusion None  Danger to Self  Current suicidal ideation? Denies (denies)  Agreement Not to Harm Self Yes  Description of Agreement verbal  Danger to Others  Danger to Others None reported or observed  Danger to Others Abnormal  Harmful Behavior to others No threats or harm toward other people

## 2023-06-26 NOTE — Group Note (Signed)
Recreation Therapy Group Note   Group Topic:Relaxation  Group Date: 06/26/2023 Start Time: 1400 End Time: 1500 Facilitators: Rosina Lowenstein, LRT, CTRS Location: Courtyard  Group Description: Meditation. LRT and patients discussed what they know about meditation and mindfulness. LRT played a Deep Breathing Meditation exercise script for patients to follow along to. LRT and patients discussed how meditation and deep breathing can be used as a coping skill post--discharge.   Goal Area(s) Addressed: Patient will practice using relaxation technique. Patient will identify a new coping skill.  Patient will follow multistep directions to reduce anxiety and stress.   Affect/Mood: N/A   Participation Level: Did not attend    Clinical Observations/Individualized Feedback: [name] did not attend group.   Plan: Continue to engage patient in RT group sessions 2-3x/week.   Rosina Lowenstein, LRT, CTRS 06/26/2023 3:27 PM

## 2023-06-26 NOTE — BHH Counselor (Signed)
CSW contacted Ms[facility] supervisor for [facility] to cancel pt's discharge as pt currently experiencing breathing issues that are being evaluated by hospitalist team.   CSW awaits discharge date update from provider and medical staff.    Reynaldo Minium, MSW, [place] 06/26/2023 11:00 AM

## 2023-06-27 DIAGNOSIS — F203 Undifferentiated schizophrenia: Secondary | ICD-10-CM | POA: Diagnosis not present

## 2023-06-27 MED ORDER — QUETIAPINE FUMARATE 200 MG PO TABS
600.0000 mg | ORAL_TABLET | Freq: Every day | ORAL | Status: DC
  Administered 2023-06-27 – 2023-07-03 (×7): 600 mg via ORAL
  Filled 2023-06-27 (×7): qty 3

## 2023-06-27 NOTE — Group Note (Signed)
Recreation Therapy Group Note   Group Topic:Goal Setting  Group Date: 06/27/2023 Start Time: 1400 End Time: 1500 Facilitators: Rosina Lowenstein, LRT, CTRS Location:  Day Room  Group Description: Vision Boards. Patients were given many different magazines, a glue stick, markers, and a piece of cardstock paper. LRT and pts discussed the importance of having goals in life. LRT and pts discussed the difference between short-term and long-term goals, as well as what a SMART goal is. LRT encouraged pts to create a vision board, with images they picked and then cut out with safety scissors from the magazine, for themselves, that capture their short and long-term goals. LRT encouraged pts to show and explain their vision board to the group.   Goal Area(s) Addressed:  Patient will gain knowledge of short vs. long term goals.  Patient will identify goals for themselves. Patient will practice setting SMART goals. Patient will verbalize their goals to LRT and peers.   Affect/Mood: N/A   Participation Level: Did not attend    Clinical Observations/Individualized Feedback: [name] did not attend group.   Plan: Continue to engage patient in RT group sessions 2-3x/week.   Rosina Lowenstein, LRT, CTRS 06/27/2023 3:50 PM

## 2023-06-27 NOTE — Progress Notes (Signed)
Patient admitted IVC to [facility] unit on June 16, 2023 for endorsing command hallucinations and poor sleep.  Patient denies SI/HI/AVH. Patient's main issue is difficulty breathing. Douneb tx TID and albuterol inhaler q6h PRN in orders. Inhaler adm at 1455 with effectiveness. Patient also requested Milk of Magnesia around dinner time. At the time of this writing, it is too early to note effectiveness.  Discharge planning ongoing. Q15 minute unit checks in place.

## 2023-06-27 NOTE — Plan of Care (Signed)
  Problem: Education: Goal: Emotional status will improve Outcome: Progressing   Problem: Education: Goal: Mental status will improve Outcome: Progressing   Problem: Education: Goal: Verbalization of understanding the information provided will improve Outcome: Progressing   Problem: Activity: Goal: Interest or engagement in activities will improve Outcome: Progressing Goal: Sleeping patterns will improve Outcome: Progressing   Problem: Coping: Goal: Ability to verbalize frustrations and anger appropriately will improve Outcome: Progressing Goal: Ability to demonstrate self-control will improve Outcome: Progressing

## 2023-06-27 NOTE — Plan of Care (Signed)
  Problem: Education: Goal: Knowledge of [facility] Education information/materials will improve Outcome: Progressing Goal: Emotional status will improve Outcome: Progressing Goal: Mental status will improve Outcome: Progressing   

## 2023-06-27 NOTE — BHH Counselor (Signed)
Pt's group home [facility] report that medications should be sent to [facility] F:[phone] at time of discharge  Reynaldo Minium, MSW, [facility] 06/27/2023 1:38 PM

## 2023-06-27 NOTE — Group Note (Signed)
Date:  06/27/2023 Time:  11:47 AM  Group Topic/Focus:  Daily goal setting/Crossword puzzle/Soothing music The purpose of this group is for patients to plan their daily goal and how will they accomplish their goal and who can they ask for help outside of the hospital. Also doing a hygiene crossword puzzle activity while listening to soothing music.    Participation Level:  Did Not Attend  Participation Quality:    Affect:    Cognitive:    Insight:   Engagement in Group:    Modes of Intervention:    Additional Comments:  Did not attend   Gaelyn Tukes T Ellina Sivertsen 06/27/2023, 11:47 AM

## 2023-06-27 NOTE — Progress Notes (Signed)
[facility] MD Progress Note  06/27/2023 1:12 PM [name]  MRN:  [id number] Subjective: [name] is seen on rounds.  His prednisone has been discontinued.  He is still having trouble breathing but the breathing treatments helped.  His biggest complaint is not sleeping but he is on a bunch of medications that should help with sleep.  His depression has improved and he currently denies any suicidal ideation but that changes from day to day.  Nurses report no issues. Principal Problem: Undifferentiated schizophrenia (HCC) Diagnosis: Principal Problem:   Undifferentiated schizophrenia (HCC) Active Problems:   COPD with acute exacerbation (HCC)   Essential hypertension   Dyslipidemia   Anxiety and depression  Total Time spent with patient: 15 minutes  Past Psychiatric History: Schizophrenia  Past Medical History:  Past Medical History:  Diagnosis Date   Acute on chronic respiratory failure with hypoxia (HCC)    Asthma    Coagulation disorder (HCC)    COPD (chronic obstructive pulmonary disease) (HCC)    Depression    History of hiatal hernia    Hypertension    Hypokalemia    Leukocytosis    Lung mass    Pneumonia    Schizophrenia (HCC)    Shortness of breath dyspnea    Stroke [facility])    Umbilical hernia    Ventral hernia     Past Surgical History:  Procedure Laterality Date   COLONOSCOPY WITH PROPOFOL N/A 09/21/2021   Procedure: COLONOSCOPY WITH PROPOFOL;  Surgeon: Regis Bill, MD;  Location: [facility] ENDOSCOPY;  Service: Endoscopy;  Laterality: N/A;   HERNIA REPAIR     INSERTION OF MESH N/A 12/18/2014   Procedure: INSERTION OF MESH;  Surgeon: Lattie Haw, MD;  Location: [facility] ORS;  Service: General;  Laterality: N/A;   SUPRA-UMBILICAL HERNIA  12/18/2014   Procedure: SUPRA-UMBILICAL HERNIA;  Surgeon: Lattie Haw, MD;  Location: [facility] ORS;  Service: General;;   UMBILICAL HERNIA REPAIR N/A 12/18/2014   Procedure: HERNIA REPAIR UMBILICAL ADULT;  Surgeon: Lattie Haw, MD;   Location: [facility] ORS;  Service: General;  Laterality: N/A;   VIDEO BRONCHOSCOPY WITH ENDOBRONCHIAL ULTRASOUND N/A 03/15/2023   Procedure: VIDEO BRONCHOSCOPY WITH ENDOBRONCHIAL ULTRASOUND;  Surgeon: Vida Rigger, MD;  Location: [facility] ORS;  Service: Thoracic;  Laterality: N/A;   Family History:  Family History  Problem Relation Age of Onset   Asthma Mother    Hypertension Father    Family Psychiatric  History: Unremarkable Social History:  Social History   Substance and Sexual Activity  Alcohol Use Not Currently   Alcohol/week: 75.0 standard drinks of alcohol   Types: 75 Cans of beer per week     Social History   Substance and Sexual Activity  Drug Use Not Currently   Types: Marijuana, "Crack" cocaine   Comment: none in 15 yrs    Social History   Socioeconomic History   Marital status: Widowed    Spouse name: Not on file   Number of children: Not on file   Years of education: Not on file   Highest education level: Not on file  Occupational History   Not on file  Tobacco Use   Smoking status: Every Day    Current packs/day: 0.15    Average packs/day: 0.2 packs/day for 45.0 years (6.8 ttl pk-yrs)    Types: Cigarettes   Smokeless tobacco: Never  Vaping Use   Vaping status: Never Used  Substance and Sexual Activity   Alcohol use: Not Currently    Alcohol/week:  75.0 standard drinks of alcohol    Types: 75 Cans of beer per week   Drug use: Not Currently    Types: Marijuana, "Crack" cocaine    Comment: none in 15 yrs   Sexual activity: Not Currently  Other Topics Concern   Not on file  Social History Narrative   Not on file   Social Determinants of Health   Financial Resource Strain: Not on file  Food Insecurity: No Food Insecurity (06/16/2023)   Hunger Vital Sign    Worried About Running Out of Food in the Last Year: Never true    Ran Out of Food in the Last Year: Never true  Transportation Needs: No Transportation Needs (06/16/2023)   PRAPARE - Therapist, art (Medical): No    Lack of Transportation (Non-Medical): No  Physical Activity: Not on file  Stress: Not on file  Social Connections: Not on file   Additional Social History:                         Sleep: Good  Appetite:  Good  Current Medications: Current Facility-Administered Medications  Medication Dose Route Frequency Provider Last Rate Last Admin   acetaminophen (TYLENOL) tablet 650 mg  650 mg Oral Q6H PRN Penn, Cicely, NP       albuterol (VENTOLIN HFA) 108 (90 Base) MCG/ACT inhaler 1-2 puff  1-2 puff Inhalation Q6H PRN Enedina Finner, MD   2 puff at 06/26/23 1609   alum & mag hydroxide-simeth (MAALOX/MYLANTA) 200-200-20 MG/5ML suspension 30 mL  30 mL Oral Q4H PRN Penn, Cranston Neighbor, NP       aspirin EC tablet 81 mg  81 mg Oral Daily Sarina Ill, DO   81 mg at 06/27/23 6644   chlorpheniramine-HYDROcodone (TUSSIONEX) 10-8 MG/5ML suspension 5 mL  5 mL Oral Q12H PRN Mansy, Jan A, MD       clopidogrel (PLAVIX) tablet 75 mg  75 mg Oral Daily Sarina Ill, DO   75 mg at 06/27/23 0347   diltiazem (CARDIZEM CD) 24 hr capsule 240 mg  240 mg Oral Daily Sarina Ill, DO   240 mg at 06/27/23 4259   diphenhydrAMINE (BENADRYL) capsule 50 mg  50 mg Oral TID PRN Mcneil Sober, NP       Or   diphenhydrAMINE (BENADRYL) injection 50 mg  50 mg Intramuscular TID PRN Mcneil Sober, NP       famotidine (PEPCID) tablet 20 mg  20 mg Oral Daily Sarina Ill, DO   20 mg at 06/27/23 5638   FLUoxetine (PROZAC) capsule 20 mg  20 mg Oral Daily Sarina Ill, DO   20 mg at 06/27/23 7564   guaiFENesin (MUCINEX) 12 hr tablet 600 mg  600 mg Oral BID Mansy, Jan A, MD   600 mg at 06/27/23 3329   haloperidol (HALDOL) tablet 5 mg  5 mg Oral TID PRN Mcneil Sober, NP       Or   haloperidol lactate (HALDOL) injection 5 mg  5 mg Intramuscular TID PRN Mcneil Sober, NP       hydrOXYzine (ATARAX) tablet 10 mg  10 mg Oral Q6H PRN Lewanda Rife, MD    10 mg at 06/26/23 1734   ipratropium-albuterol (DUONEB) 0.5-2.5 (3) MG/3ML nebulizer solution 3 mL  3 mL Nebulization TID Enedina Finner, MD   3 mL at 06/27/23 1020   LORazepam (ATIVAN) tablet 2 mg  2 mg Oral TID PRN  Penn, Cranston Neighbor, NP       Or   LORazepam (ATIVAN) injection 2 mg  2 mg Intramuscular TID PRN Penn, Cranston Neighbor, NP       magnesium hydroxide (MILK OF MAGNESIA) suspension 30 mL  30 mL Oral Daily PRN Penn, Cranston Neighbor, NP   30 mL at 06/26/23 0936   mirtazapine (REMERON) tablet 7.5 mg  7.5 mg Oral QHS Sarina Ill, DO   7.5 mg at 06/26/23 2143   montelukast (SINGULAIR) tablet 10 mg  10 mg Oral QHS Sarina Ill, DO   10 mg at 06/26/23 2145   pravastatin (PRAVACHOL) tablet 20 mg  20 mg Oral q1800 Sarina Ill, DO   20 mg at 06/26/23 1734   QUEtiapine (SEROQUEL) tablet 400 mg  400 mg Oral QHS Sarina Ill, DO   400 mg at 06/26/23 2145   traZODone (DESYREL) tablet 200 mg  200 mg Oral QHS PRN Charm Rings, NP   200 mg at 06/26/23 2152   umeclidinium bromide (INCRUSE ELLIPTA) 62.5 MCG/ACT 1 puff  1 puff Inhalation Daily Sarina Ill, DO   1 puff at 06/27/23 0908    Lab Results: No results found for this or any previous visit (from the past 48 hour(s)).  Blood Alcohol level:  Lab Results  Component Value Date   ETH <10 06/13/2023   ETH <10 03/24/2023    Metabolic Disorder Labs: Lab Results  Component Value Date   HGBA1C 5.6 06/17/2023   MPG 114.02 06/17/2023   MPG 119.76 03/26/2023   No results found for: "PROLACTIN" Lab Results  Component Value Date   CHOL 172 06/17/2023   TRIG 102 06/17/2023   HDL 78 06/17/2023   CHOLHDL 2.2 06/17/2023   VLDL 20 06/17/2023   LDLCALC 74 06/17/2023   LDLCALC 51 03/26/2023    Physical Findings: AIMS:  , ,  ,  ,    CIWA:    COWS:     Musculoskeletal: Strength & Muscle Tone: within normal limits Gait & Station: normal Patient leans: N/A  Psychiatric Specialty Exam:  Presentation   General Appearance:  Appropriate for Environment  Eye Contact: Fair  Speech: Clear and Coherent  Speech Volume: Decreased  Handedness: Right   Mood and Affect  Mood: Anxious; Depressed  Affect: Constricted   Thought Process  Thought Processes: Coherent; Goal Directed; Linear  Descriptions of Associations:Intact  Orientation:Full (Time, Place and Person)  Thought Content:Logical  History of Schizophrenia/Schizoaffective disorder:Yes  Duration of Psychotic Symptoms:Greater than six months  Hallucinations:No data recorded Ideas of Reference:Paranoia; Other (comment) ("just anxious")  Suicidal Thoughts:No data recorded Homicidal Thoughts:No data recorded  Sensorium  Memory: Immediate Fair  Judgment: Intact  Insight: Present   Executive Functions  Concentration: Fair  Attention Span: Fair  Recall: [employer] of Knowledge: Fair  Language: Fair   Psychomotor Activity  Psychomotor Activity:No data recorded  Assets  Assets: Communication Skills; Desire for Improvement; Housing   Sleep  Sleep:No data recorded    Blood pressure 105/76, pulse (!) 101, temperature 98.6 F (37 C), resp. rate 18, height 6\' 1"  (1.854 m), weight 100.2 kg, SpO2 93%. Body mass index is 29.16 kg/m.   Treatment Plan Summary: Daily contact with patient to assess and evaluate symptoms and progress in treatment, Medication management, and Plan continue current medications.  Joh Rao Tresea Mall, DO 06/27/2023, 1:12 PM

## 2023-06-28 NOTE — Progress Notes (Signed)
   06/28/23 0600  15 Minute Checks  Location Bedroom  Visual Appearance Calm  Behavior Sleeping  Sleep (Behavioral Health Patients Only)  Calculate sleep? (Click Yes once per 24 hr at 0600 safety check) Yes  Documented sleep last 24 hours 13.75

## 2023-06-28 NOTE — Progress Notes (Signed)
   06/28/23 0300  Psych Admission Type (Psych Patients Only)  Admission Status Involuntary  Psychosocial Assessment  Patient Complaints None  Eye Contact Fair  Facial Expression Flat  Affect Sad  Speech Logical/coherent  Interaction Minimal  Motor Activity Slow  Appearance/Hygiene In scrubs  Behavior Characteristics Cooperative  Mood Depressed  Thought Process  Coherency WDL  Content WDL  Delusions None reported or observed  Perception WDL  Hallucination None reported or observed  Judgment Impaired  Confusion None  Danger to Self  Current suicidal ideation? Denies  Agreement Not to Harm Self Yes  Description of Agreement verbal  Danger to Others  Danger to Others None reported or observed  Danger to Others Abnormal  Harmful Behavior to others No threats or harm toward other people  Destructive Behavior No threats or harm toward property

## 2023-06-28 NOTE — Progress Notes (Signed)
[name] Progress Note  06/28/2023 3:05 PM [name]  MRN:  [id number] Subjective: [name] is seen on rounds.  He has been pleasant and cooperative.  Says that he slept better last night after increasing his Seroquel.  He denies any side effects.  I talked to him about returning to the group follow-up on Friday and he was fine with that. Principal Problem: Undifferentiated schizophrenia (HCC) Diagnosis: Principal Problem:   Undifferentiated schizophrenia (HCC) Active Problems:   COPD with acute exacerbation (HCC)   Essential hypertension   Dyslipidemia   Anxiety and depression  Total Time spent with patient: 15 minutes  Past Psychiatric History: Schizophrenia  Past Medical History:  Past Medical History:  Diagnosis Date   Acute on chronic respiratory failure with hypoxia (HCC)    Asthma    Coagulation disorder (HCC)    COPD (chronic obstructive pulmonary disease) (HCC)    Depression    History of hiatal hernia    Hypertension    Hypokalemia    Leukocytosis    Lung mass    Pneumonia    Schizophrenia (HCC)    Shortness of breath dyspnea    Stroke [facility])    Umbilical hernia    Ventral hernia     Past Surgical History:  Procedure Laterality Date   COLONOSCOPY WITH PROPOFOL N/A 09/21/2021   Procedure: COLONOSCOPY WITH PROPOFOL;  Surgeon: Regis Bill, Chavez;  Location: [facility] ENDOSCOPY;  Service: Endoscopy;  Laterality: N/A;   HERNIA REPAIR     INSERTION OF MESH N/A 12/18/2014   Procedure: INSERTION OF MESH;  Surgeon: Lattie Haw, Chavez;  Location: [facility] ORS;  Service: General;  Laterality: N/A;   SUPRA-UMBILICAL HERNIA  12/18/2014   Procedure: SUPRA-UMBILICAL HERNIA;  Surgeon: Lattie Haw, Chavez;  Location: [facility] ORS;  Service: General;;   UMBILICAL HERNIA REPAIR N/A 12/18/2014   Procedure: HERNIA REPAIR UMBILICAL ADULT;  Surgeon: Lattie Haw, Chavez;  Location: [facility] ORS;  Service: General;  Laterality: N/A;   VIDEO BRONCHOSCOPY WITH ENDOBRONCHIAL ULTRASOUND N/A 03/15/2023    Procedure: VIDEO BRONCHOSCOPY WITH ENDOBRONCHIAL ULTRASOUND;  Surgeon: Vida Rigger, Chavez;  Location: [facility] ORS;  Service: Thoracic;  Laterality: N/A;   Family History:  Family History  Problem Relation Age of Onset   Asthma Mother    Hypertension Father    Family Psychiatric  History: Unremarkable Social History:  Social History   Substance and Sexual Activity  Alcohol Use Not Currently   Alcohol/week: 75.0 standard drinks of alcohol   Types: 75 Cans of beer per week     Social History   Substance and Sexual Activity  Drug Use Not Currently   Types: Marijuana, "Crack" cocaine   Comment: none in 15 yrs    Social History   Socioeconomic History   Marital status: Widowed    Spouse name: Not on file   Number of children: Not on file   Years of education: Not on file   Highest education level: Not on file  Occupational History   Not on file  Tobacco Use   Smoking status: Every Day    Current packs/day: 0.15    Average packs/day: 0.2 packs/day for 45.0 years (6.8 ttl pk-yrs)    Types: Cigarettes   Smokeless tobacco: Never  Vaping Use   Vaping status: Never Used  Substance and Sexual Activity   Alcohol use: Not Currently    Alcohol/week: 75.0 standard drinks of alcohol    Types: 75 Cans of beer per week   Drug  use: Not Currently    Types: Marijuana, "Crack" cocaine    Comment: none in 15 yrs   Sexual activity: Not Currently  Other Topics Concern   Not on file  Social History Narrative   Not on file   Social Determinants of Health   Financial Resource Strain: Not on file  Food Insecurity: No Food Insecurity (06/16/2023)   Hunger Vital Sign    Worried About Running Out of Food in the Last Year: Never true    Ran Out of Food in the Last Year: Never true  Transportation Needs: No Transportation Needs (06/16/2023)   PRAPARE - Administrator, Civil Service (Medical): No    Lack of Transportation (Non-Medical): No  Physical Activity: Not on file   Stress: Not on file  Social Connections: Not on file   Additional Social History:                         Sleep: Good  Appetite:  Good  Current Medications: Current Facility-Administered Medications  Medication Dose Route Frequency Provider Last Rate Last Admin   acetaminophen (TYLENOL) tablet 650 mg  650 mg Oral Q6H PRN Penn, Cicely, NP       albuterol (VENTOLIN HFA) 108 (90 Base) MCG/ACT inhaler 1-2 puff  1-2 puff Inhalation Q6H PRN Enedina Finner, Chavez   1 puff at 06/27/23 1455   alum & mag hydroxide-simeth (MAALOX/MYLANTA) 200-200-20 MG/5ML suspension 30 mL  30 mL Oral Q4H PRN Penn, Cranston Neighbor, NP       aspirin EC tablet 81 mg  81 mg Oral Daily Sarina Ill, DO   81 mg at 06/28/23 1014   chlorpheniramine-HYDROcodone (TUSSIONEX) 10-8 MG/5ML suspension 5 mL  5 mL Oral Q12H PRN Mansy, Jan A, Chavez       clopidogrel (PLAVIX) tablet 75 mg  75 mg Oral Daily Sarina Ill, DO   75 mg at 06/28/23 1014   diltiazem (CARDIZEM CD) 24 hr capsule 240 mg  240 mg Oral Daily Sarina Ill, DO   240 mg at 06/28/23 1014   diphenhydrAMINE (BENADRYL) capsule 50 mg  50 mg Oral TID PRN Mcneil Sober, NP       Or   diphenhydrAMINE (BENADRYL) injection 50 mg  50 mg Intramuscular TID PRN Mcneil Sober, NP       famotidine (PEPCID) tablet 20 mg  20 mg Oral Daily Sarina Ill, DO   20 mg at 06/28/23 1014   FLUoxetine (PROZAC) capsule 20 mg  20 mg Oral Daily Sarina Ill, DO   20 mg at 06/28/23 1014   guaiFENesin (MUCINEX) 12 hr tablet 600 mg  600 mg Oral BID Mansy, Jan A, Chavez   600 mg at 06/28/23 1014   haloperidol (HALDOL) tablet 5 mg  5 mg Oral TID PRN Mcneil Sober, NP       Or   haloperidol lactate (HALDOL) injection 5 mg  5 mg Intramuscular TID PRN Mcneil Sober, NP       hydrOXYzine (ATARAX) tablet 10 mg  10 mg Oral Q6H PRN Lewanda Rife, Chavez   10 mg at 06/28/23 1014   ipratropium-albuterol (DUONEB) 0.5-2.5 (3) MG/3ML nebulizer solution 3 mL  3 mL  Nebulization TID Enedina Finner, Chavez   3 mL at 06/28/23 1002   LORazepam (ATIVAN) tablet 2 mg  2 mg Oral TID PRN Mcneil Sober, NP       Or   LORazepam (ATIVAN) injection 2 mg  2 mg Intramuscular TID PRN Mcneil Sober, NP       magnesium hydroxide (MILK OF MAGNESIA) suspension 30 mL  30 mL Oral Daily PRN Penn, Cranston Neighbor, NP   30 mL at 06/28/23 1059   mirtazapine (REMERON) tablet 7.5 mg  7.5 mg Oral QHS Sarina Ill, DO   7.5 mg at 06/27/23 2122   montelukast (SINGULAIR) tablet 10 mg  10 mg Oral QHS Sarina Ill, DO   10 mg at 06/27/23 2122   pravastatin (PRAVACHOL) tablet 20 mg  20 mg Oral q1800 Sarina Ill, DO   20 mg at 06/27/23 1634   QUEtiapine (SEROQUEL) tablet 600 mg  600 mg Oral QHS Sarina Ill, DO   600 mg at 06/27/23 2121   traZODone (DESYREL) tablet 200 mg  200 mg Oral QHS PRN Charm Rings, NP   200 mg at 06/27/23 2121   umeclidinium bromide (INCRUSE ELLIPTA) 62.5 MCG/ACT 1 puff  1 puff Inhalation Daily Sarina Ill, DO   1 puff at 06/28/23 1013    Lab Results: No results found for this or any previous visit (from the past 48 hour(s)).  Blood Alcohol level:  Lab Results  Component Value Date   ETH <10 06/13/2023   ETH <10 03/24/2023    Metabolic Disorder Labs: Lab Results  Component Value Date   HGBA1C 5.6 06/17/2023   MPG 114.02 06/17/2023   MPG 119.76 03/26/2023   No results found for: "PROLACTIN" Lab Results  Component Value Date   CHOL 172 06/17/2023   TRIG 102 06/17/2023   HDL 78 06/17/2023   CHOLHDL 2.2 06/17/2023   VLDL 20 06/17/2023   LDLCALC 74 06/17/2023   LDLCALC 51 03/26/2023    Physical Findings: AIMS:  , ,  ,  ,    CIWA:    COWS:     Musculoskeletal: Strength & Muscle Tone: within normal limits Gait & Station: normal Patient leans: N/A  Psychiatric Specialty Exam:  Presentation  General Appearance:  Appropriate for Environment  Eye Contact: Fair  Speech: Clear and Coherent  Speech  Volume: Decreased  Handedness: Right   Mood and Affect  Mood: Anxious; Depressed  Affect: Constricted   Thought Process  Thought Processes: Coherent; Goal Directed; Linear  Descriptions of Associations:Intact  Orientation:Full (Time, Place and Person)  Thought Content:Logical  History of Schizophrenia/Schizoaffective disorder:Yes  Duration of Psychotic Symptoms:Greater than six months  Hallucinations:No data recorded Ideas of Reference:Paranoia; Other (comment) ("just anxious")  Suicidal Thoughts:No data recorded Homicidal Thoughts:No data recorded  Sensorium  Memory: Immediate Fair  Judgment: Intact  Insight: Present   Executive Functions  Concentration: Fair  Attention Span: Fair  Recall: [employer] of Knowledge: Fair  Language: Fair   Psychomotor Activity  Psychomotor Activity:No data recorded  Assets  Assets: Communication Skills; Desire for Improvement; Housing   Sleep  Sleep:No data recorded    Blood pressure 110/80, pulse 94, temperature (!) 97 F (36.1 C), resp. rate 16, height 6\' 1"  (1.854 m), weight 100.2 kg, SpO2 93%. Body mass index is 29.16 kg/m.   Treatment Plan Summary: Daily contact with patient to assess and evaluate symptoms and progress in treatment, Medication management, and Plan continue current medication.  Discharge Friday.  Sarina Ill, DO 06/28/2023, 3:05 PM

## 2023-06-28 NOTE — Group Note (Signed)
Recreation Therapy Group Note   Group Topic:Health and Wellness  Group Date: 06/28/2023 Start Time: 1400 End Time: 1450 Facilitators: Rosina Lowenstein, LRT, CTRS Location:  Day Room  Group Description: Seated Exercise. LRT discussed the mental and physical benefits of exercise. LRT and group discussed how physical activity can be used as a coping skill. Pt's and LRT followed along to an exercise video on the TV screen that provided a visual representation and audio description of every exercise performed. Pt's encouraged to listen to their bodies and stop at any time if they experience feelings of discomfort or pain. Pts were encouraged to drink water and stay hydrated.   Goal Area(s) Addressed: Patient will learn benefits of physical activity. Patient will identify exercise as a coping skill.  Patient will follow multistep directions. Patient will try a new leisure interest.    Affect/Mood: N/A   Participation Level: Did not attend    Clinical Observations/Individualized Feedback: [name] did not attend group.   Plan: Continue to engage patient in RT group sessions 2-3x/week.   Rosina Lowenstein, LRT, CTRS 06/28/2023 3:01 PM

## 2023-06-28 NOTE — Progress Notes (Signed)
   06/28/23 2100  Psych Admission Type (Psych Patients Only)  Admission Status Involuntary  Psychosocial Assessment  Patient Complaints None  Eye Contact Fair  Facial Expression Flat  Affect Sad  Speech Logical/coherent  Interaction Assertive  Motor Activity Slow  Appearance/Hygiene In scrubs  Behavior Characteristics Cooperative  Mood Depressed  Thought Process  Coherency WDL  Content WDL  Delusions None reported or observed  Perception WDL  Hallucination None reported or observed  Judgment Impaired  Confusion None  Danger to Self  Current suicidal ideation? Denies  Agreement Not to Harm Self Yes  Description of Agreement verbal  Danger to Others  Danger to Others None reported or observed  Danger to Others Abnormal  Harmful Behavior to others No threats or harm toward other people  Destructive Behavior No threats or harm toward property

## 2023-06-28 NOTE — Group Note (Signed)
[facility] LCSW Group Therapy Note    Group Date: 06/28/2023 Start Time: 1300 End Time: 1400  Type of Therapy and Topic:  Group Therapy:  Overcoming Obstacles  Participation Level:  [facility] PARTICIPATION LEVEL: Did Not Attend  Mood:  Description of Group:   In this group patients will be encouraged to explore what they see as obstacles to their own wellness and recovery. They will be guided to discuss their thoughts, feelings, and behaviors related to these obstacles. The group will process together ways to cope with barriers, with attention given to specific choices patients can make. Each patient will be challenged to identify changes they are motivated to make in order to overcome their obstacles. This group will be process-oriented, with patients participating in exploration of their own experiences as well as giving and receiving support and challenge from other group members.  Therapeutic Goals: 1. Patient will identify personal and current obstacles as they relate to admission. 2. Patient will identify barriers that currently interfere with their wellness or overcoming obstacles.  3. Patient will identify feelings, thought process and behaviors related to these barriers. 4. Patient will identify two changes they are willing to make to overcome these obstacles:    Summary of Patient Progress   X   Therapeutic Modalities:   Cognitive Behavioral Therapy Solution Focused Therapy Motivational Interviewing Relapse Prevention Therapy   [name], LCSWA

## 2023-06-28 NOTE — Group Note (Signed)
Date:  06/28/2023 Time:  11:11 AM  Group Topic/Focus:  Wellness Toolbox:   The focus of this group is to discuss various aspects of wellness, balancing those aspects and exploring ways to increase the ability to experience wellness.  Patients will create a wellness toolbox for use upon discharge.    Participation Level:  Active  Participation Quality:  Appropriate  Affect:  Appropriate  Cognitive:  Appropriate  Insight: Appropriate  Engagement in Group:  Engaged  Modes of Intervention:  Activity and Discussion  Additional Comments:    Marta Antu 06/28/2023, 11:11 AM

## 2023-06-28 NOTE — Progress Notes (Signed)
   06/28/23 1249  Psych Admission Type (Psych Patients Only)  Admission Status Involuntary  Psychosocial Assessment  Patient Complaints None  Eye Contact Fair  Facial Expression Anxious  Affect Anxious  Speech Logical/coherent  Interaction Minimal  Motor Activity Slow  Appearance/Hygiene In scrubs  Behavior Characteristics Cooperative  Mood Anxious  Thought Process  Coherency WDL  Content WDL  Delusions None reported or observed  Perception WDL  Hallucination None reported or observed  Judgment Impaired  Confusion None  Danger to Self  Current suicidal ideation? Denies  Agreement Not to Harm Self Yes  Description of Agreement verbal  Danger to Others  Danger to Others None reported or observed  Danger to Others Abnormal  Harmful Behavior to others No threats or harm toward other people  Destructive Behavior No threats or harm toward property

## 2023-06-29 NOTE — Group Note (Unsigned)
Date:  06/29/2023 Time:  12:45 AM  Group Topic/Focus:  Building Self Esteem:   The Focus of this group is helping patients become aware of the effects of self-esteem on their lives, the things they and others do that enhance or undermine their self-esteem, seeing the relationship between their level of self-esteem and the choices they make and learning ways to enhance self-esteem.    Participation Level:  Active  Participation Quality:  Appropriate  Affect:  Appropriate  Cognitive:  Alert  Insight: Appropriate  Engagement in Group:  Engaged  Modes of Intervention:  Discussion  Additional Comments:    Maeola Harman 06/29/2023, 12:45 AM

## 2023-06-29 NOTE — Progress Notes (Signed)
   06/29/23 2100  Psych Admission Type (Psych Patients Only)  Admission Status Involuntary  Psychosocial Assessment  Patient Complaints Depression  Eye Contact Fair  Facial Expression Flat  Affect Flat  Speech Logical/coherent  Interaction Minimal  Motor Activity Slow  Appearance/Hygiene In scrubs  Behavior Characteristics Cooperative  Mood Depressed  Thought Process  Coherency WDL  Content WDL  Delusions None reported or observed  Perception WDL  Hallucination None reported or observed  Judgment Impaired  Confusion None  Danger to Self  Current suicidal ideation? Denies  Agreement Not to Harm Self Yes  Description of Agreement verbal  Danger to Others  Danger to Others None reported or observed  Danger to Others Abnormal  Harmful Behavior to others No threats or harm toward other people  Destructive Behavior No threats or harm toward property

## 2023-06-29 NOTE — BH IP Treatment Plan (Signed)
Interdisciplinary Treatment and Diagnostic Plan Update  06/29/2023 Time of Session: 9:30 [name] MRN: [id number]  Principal Diagnosis: Undifferentiated schizophrenia [facility])  Secondary Diagnoses: Principal Problem:   Undifferentiated schizophrenia (HCC) Active Problems:   COPD with acute exacerbation (HCC)   Essential hypertension   Dyslipidemia   Anxiety and depression   Current Medications:  Current Facility-Administered Medications  Medication Dose Route Frequency Provider Last Rate Last Admin   acetaminophen (TYLENOL) tablet 650 mg  650 mg Oral Q6H PRN Penn, Cicely, NP       albuterol (VENTOLIN HFA) 108 (90 Base) MCG/ACT inhaler 1-2 puff  1-2 puff Inhalation Q6H PRN Enedina Finner, MD   1 puff at 06/27/23 1455   alum & mag hydroxide-simeth (MAALOX/MYLANTA) 200-200-20 MG/5ML suspension 30 mL  30 mL Oral Q4H PRN Penn, Cranston Neighbor, NP       aspirin EC tablet 81 mg  81 mg Oral Daily Sarina Ill, DO   81 mg at 06/29/23 8413   chlorpheniramine-HYDROcodone (TUSSIONEX) 10-8 MG/5ML suspension 5 mL  5 mL Oral Q12H PRN Mansy, Jan A, MD       clopidogrel (PLAVIX) tablet 75 mg  75 mg Oral Daily Sarina Ill, DO   75 mg at 06/29/23 2440   diltiazem (CARDIZEM CD) 24 hr capsule 240 mg  240 mg Oral Daily Sarina Ill, DO   240 mg at 06/29/23 1027   diphenhydrAMINE (BENADRYL) capsule 50 mg  50 mg Oral TID PRN Mcneil Sober, NP       Or   diphenhydrAMINE (BENADRYL) injection 50 mg  50 mg Intramuscular TID PRN Mcneil Sober, NP       famotidine (PEPCID) tablet 20 mg  20 mg Oral Daily Sarina Ill, DO   20 mg at 06/29/23 2536   FLUoxetine (PROZAC) capsule 20 mg  20 mg Oral Daily Sarina Ill, DO   20 mg at 06/29/23 6440   guaiFENesin (MUCINEX) 12 hr tablet 600 mg  600 mg Oral BID Mansy, Jan A, MD   600 mg at 06/29/23 3474   haloperidol (HALDOL) tablet 5 mg  5 mg Oral TID PRN Mcneil Sober, NP       Or   haloperidol lactate (HALDOL) injection 5 mg  5  mg Intramuscular TID PRN Mcneil Sober, NP       hydrOXYzine (ATARAX) tablet 10 mg  10 mg Oral Q6H PRN Lewanda Rife, MD   10 mg at 06/28/23 1014   ipratropium-albuterol (DUONEB) 0.5-2.5 (3) MG/3ML nebulizer solution 3 mL  3 mL Nebulization TID Enedina Finner, MD   3 mL at 06/29/23 0924   LORazepam (ATIVAN) tablet 2 mg  2 mg Oral TID PRN Mcneil Sober, NP       Or   LORazepam (ATIVAN) injection 2 mg  2 mg Intramuscular TID PRN Mcneil Sober, NP       magnesium hydroxide (MILK OF MAGNESIA) suspension 30 mL  30 mL Oral Daily PRN Penn, Cranston Neighbor, NP   30 mL at 06/28/23 1059   mirtazapine (REMERON) tablet 7.5 mg  7.5 mg Oral QHS Sarina Ill, DO   7.5 mg at 06/28/23 2110   montelukast (SINGULAIR) tablet 10 mg  10 mg Oral QHS Sarina Ill, DO   10 mg at 06/28/23 2110   pravastatin (PRAVACHOL) tablet 20 mg  20 mg Oral q1800 Sarina Ill, DO   20 mg at 06/28/23 1752   QUEtiapine (SEROQUEL) tablet 600 mg  600 mg Oral QHS Elane Fritz  Edward, DO   600 mg at 06/28/23 2111   traZODone (DESYREL) tablet 200 mg  200 mg Oral QHS PRN Charm Rings, NP   200 mg at 06/28/23 2110   umeclidinium bromide (INCRUSE ELLIPTA) 62.5 MCG/ACT 1 puff  1 puff Inhalation Daily Sarina Ill, DO   1 puff at 06/29/23 1610   PTA Medications: Medications Prior to Admission  Medication Sig Dispense Refill Last Dose   albuterol (VENTOLIN HFA) 108 (90 Base) MCG/ACT inhaler Inhale 2 puffs into the lungs every 4 (four) hours as needed for wheezing or shortness of breath.      aspirin EC 81 MG tablet Take 81 mg by mouth daily. Swallow whole.      clopidogrel (PLAVIX) 75 MG tablet Take 1 tablet (75 mg total) by mouth daily. 30 tablet 3    dextromethorphan-guaiFENesin (ROBITUSSIN-DM) 10-100 MG/5ML liquid Take 10 mLs by mouth every 6 (six) hours as needed for cough.      diltiazem (CARDIZEM CD) 240 MG 24 hr capsule Take 1 capsule (240 mg total) by mouth daily. 30 capsule 3    docusate sodium  (COLACE) 100 MG capsule Take 100 mg by mouth 2 (two) times daily as needed for mild constipation.      [EXPIRED] doxycycline (VIBRA-TABS) 100 MG tablet Take 1 tablet (100 mg total) by mouth every 12 (twelve) hours for 3 days.      famotidine (PEPCID) 20 MG tablet Take 1 tablet (20 mg total) by mouth daily. 30 tablet 0    Fluticasone-Umeclidin-Vilant (TRELEGY ELLIPTA) 100-62.5-25 MCG/ACT AEPB Inhale 1 puff into the lungs daily. 28 each 1    guaiFENesin (MUCINEX) 600 MG 12 hr tablet Take 600 mg by mouth 2 (two) times daily as needed.      ipratropium-albuterol (DUONEB) 0.5-2.5 (3) MG/3ML SOLN Take 3 mLs by nebulization 3 (three) times daily. 270 mL 0    lovastatin (MEVACOR) 20 MG tablet Take 20 mg by mouth at bedtime.      mirtazapine (REMERON) 30 MG tablet Take 1 tablet (30 mg total) by mouth at bedtime. 30 tablet 3    montelukast (SINGULAIR) 10 MG tablet Take 1 tablet (10 mg total) by mouth at bedtime. 30 tablet 1    paliperidone (INVEGA) 9 MG 24 hr tablet Take 1 tablet (9 mg total) by mouth at bedtime. 30 tablet 3    [EXPIRED] predniSONE (DELTASONE) 20 MG tablet Take 2 tablets (40 mg total) by mouth daily with breakfast for 3 days.      prochlorperazine (COMPAZINE) 10 MG tablet Take 1 tablet (10 mg total) by mouth every 6 (six) hours as needed for nausea or vomiting. 90 tablet 0    traZODone (DESYREL) 150 MG tablet Take 1 tablet (150 mg total) by mouth at bedtime as needed for sleep. 30 tablet 3     Patient Stressors: Health problems   Other: AVH experiences    Patient Strengths: Communication skills  Supportive family/friends   Treatment Modalities: Medication Management, Group therapy, Case management,  1 to 1 session with clinician, Psychoeducation, Recreational therapy.   Physician Treatment Plan for Primary Diagnosis: Undifferentiated schizophrenia (HCC) Long Term Goal(s): Improvement in symptoms so as ready for discharge   Short Term Goals: Ability to identify changes in lifestyle  to reduce recurrence of condition will improve Ability to verbalize feelings will improve Ability to disclose and discuss suicidal ideas Ability to demonstrate self-control will improve Ability to identify and develop effective coping behaviors will improve Ability to maintain  clinical measurements within normal limits will improve Compliance with prescribed medications will improve Ability to identify triggers associated with substance abuse/mental health issues will improve  Medication Management: Evaluate patient's response, side effects, and tolerance of medication regimen.  Therapeutic Interventions: 1 to 1 sessions, Unit Group sessions and Medication administration.  Evaluation of Outcomes: Progressing  Physician Treatment Plan for Secondary Diagnosis: Principal Problem:   Undifferentiated schizophrenia (HCC) Active Problems:   COPD with acute exacerbation (HCC)   Essential hypertension   Dyslipidemia   Anxiety and depression  Long Term Goal(s): Improvement in symptoms so as ready for discharge   Short Term Goals: Ability to identify changes in lifestyle to reduce recurrence of condition will improve Ability to verbalize feelings will improve Ability to disclose and discuss suicidal ideas Ability to demonstrate self-control will improve Ability to identify and develop effective coping behaviors will improve Ability to maintain clinical measurements within normal limits will improve Compliance with prescribed medications will improve Ability to identify triggers associated with substance abuse/mental health issues will improve     Medication Management: Evaluate patient's response, side effects, and tolerance of medication regimen.  Therapeutic Interventions: 1 to 1 sessions, Unit Group sessions and Medication administration.  Evaluation of Outcomes: Progressing   RN Treatment Plan for Primary Diagnosis: Undifferentiated schizophrenia (HCC) Long Term Goal(s): Knowledge  of disease and therapeutic regimen to maintain health will improve  Short Term Goals: Ability to remain free from injury will improve, Ability to verbalize frustration and anger appropriately will improve, Ability to demonstrate self-control, Ability to participate in decision making will improve, Ability to verbalize feelings will improve, Ability to disclose and discuss suicidal ideas, Ability to identify and develop effective coping behaviors will improve, and Compliance with prescribed medications will improve  Medication Management: RN will administer medications as ordered by provider, will assess and evaluate patient's response and provide education to patient for prescribed medication. RN will report any adverse and/or side effects to prescribing provider.  Therapeutic Interventions: 1 on 1 counseling sessions, Psychoeducation, Medication administration, Evaluate responses to treatment, Monitor vital signs and CBGs as ordered, Perform/monitor CIWA, COWS, AIMS and Fall Risk screenings as ordered, Perform wound care treatments as ordered.  Evaluation of Outcomes: Progressing   LCSW Treatment Plan for Primary Diagnosis: Undifferentiated schizophrenia (HCC) Long Term Goal(s): Safe transition to appropriate next level of care at discharge, Engage patient in therapeutic group addressing interpersonal concerns.  Short Term Goals: Engage patient in aftercare planning with referrals and resources, Increase social support, Increase ability to appropriately verbalize feelings, Increase emotional regulation, Facilitate acceptance of mental health diagnosis and concerns, Facilitate patient progression through stages of change regarding substance use diagnoses and concerns, Identify triggers associated with mental health/substance abuse issues, and Increase skills for wellness and recovery  Therapeutic Interventions: Assess for all discharge needs, 1 to 1 time with Social worker, Explore available  resources and support systems, Assess for adequacy in community support network, Educate family and significant other(s) on suicide prevention, Complete Psychosocial Assessment, Interpersonal group therapy.  Evaluation of Outcomes: Progressing   Progress in Treatment: Attending groups: No. Participating in groups: No. Taking medication as prescribed: Yes. Toleration medication: Yes. Family/Significant other contact made: Yes, individual(s) contacted:  Franchot Erichsen, [employer] Manager , [phone] Patient understands diagnosis: Yes. Discussing patient identified problems/goals with staff: Yes. Medical problems stabilized or resolved: Yes. Denies suicidal/homicidal ideation: No. Issues/concerns per patient self-inventory: No. Other: none    New problem(s) identified: No, Describe: None Update 06/24/23: No changes at this  time. Update 06/29/23: No changes at this time    New Short Term/Long Term Goal(s):  elimination of symptoms of psychosis, medication management for mood stabilization; elimination of SI thoughts; development of comprehensive mental wellness plan. Update 06/24/23: No changes at this time. Update 06/29/23: No changes at this time      Patient Goals:  "Get sleep"  Update 06/24/23: No changes at this time. Update 06/29/23: No changes at this time      Discharge Plan or Barriers: CSW to assist with discharge planning Update 06/24/23: No changes at this time. Update 06/29/23: No changes at this time      Reason for Continuation of Hospitalization: Medication stabilization   Estimated Length of Stay: 1 to 7 days  Update 06/24/23: No changes at this time. Update 06/29/23: TBD  Last 3 [place] Suicide Severity Risk Score: Flowsheet Row Admission (Current) from 06/16/2023 in [facility] ED to Hosp-Admission (Discharged) from 06/13/2023 in [facility] ED to Hosp-Admission (Discharged) from 05/25/2023 in [facility]  REGIONAL CARDIAC MED PCU  C-SSRS RISK CATEGORY Low Risk No Risk No Risk       Last PHQ 2/9 Scores:    02/03/2023   11:49 AM  Depression screen PHQ 2/9  Decreased Interest 0  Down, Depressed, Hopeless 0  PHQ - 2 Score 0    Scribe for Treatment Team: Elza Rafter, Theresia Majors 06/29/2023 10:26 AM

## 2023-06-29 NOTE — Progress Notes (Signed)
[facility] MD Progress Note  06/29/2023 12:59 PM [name]  MRN:  [id number] Subjective: [name] is seen on rounds.  He has been pleasant and cooperative.  He states that he slept better last night.  He says that he is ready to be discharged tomorrow.  No side effects from his medications.  His breathing seems to be stable.  He denies any suicidal ideation. Principal Problem: Undifferentiated schizophrenia (HCC) Diagnosis: Principal Problem:   Undifferentiated schizophrenia (HCC) Active Problems:   COPD with acute exacerbation (HCC)   Essential hypertension   Dyslipidemia   Anxiety and depression  Total Time spent with patient: 15 minutes  Past Psychiatric History: Schizophrenia  Past Medical History:  Past Medical History:  Diagnosis Date   Acute on chronic respiratory failure with hypoxia (HCC)    Asthma    Coagulation disorder (HCC)    COPD (chronic obstructive pulmonary disease) (HCC)    Depression    History of hiatal hernia    Hypertension    Hypokalemia    Leukocytosis    Lung mass    Pneumonia    Schizophrenia (HCC)    Shortness of breath dyspnea    Stroke [facility])    Umbilical hernia    Ventral hernia     Past Surgical History:  Procedure Laterality Date   COLONOSCOPY WITH PROPOFOL N/A 09/21/2021   Procedure: COLONOSCOPY WITH PROPOFOL;  Surgeon: Regis Bill, MD;  Location: [facility] ENDOSCOPY;  Service: Endoscopy;  Laterality: N/A;   HERNIA REPAIR     INSERTION OF MESH N/A 12/18/2014   Procedure: INSERTION OF MESH;  Surgeon: Lattie Haw, MD;  Location: [facility] ORS;  Service: General;  Laterality: N/A;   SUPRA-UMBILICAL HERNIA  12/18/2014   Procedure: SUPRA-UMBILICAL HERNIA;  Surgeon: Lattie Haw, MD;  Location: [facility] ORS;  Service: General;;   UMBILICAL HERNIA REPAIR N/A 12/18/2014   Procedure: HERNIA REPAIR UMBILICAL ADULT;  Surgeon: Lattie Haw, MD;  Location: [facility] ORS;  Service: General;  Laterality: N/A;   VIDEO BRONCHOSCOPY WITH ENDOBRONCHIAL ULTRASOUND  N/A 03/15/2023   Procedure: VIDEO BRONCHOSCOPY WITH ENDOBRONCHIAL ULTRASOUND;  Surgeon: Vida Rigger, MD;  Location: [facility] ORS;  Service: Thoracic;  Laterality: N/A;   Family History:  Family History  Problem Relation Age of Onset   Asthma Mother    Hypertension Father    Family Psychiatric  History: Unremarkable Social History:  Social History   Substance and Sexual Activity  Alcohol Use Not Currently   Alcohol/week: 75.0 standard drinks of alcohol   Types: 75 Cans of beer per week     Social History   Substance and Sexual Activity  Drug Use Not Currently   Types: Marijuana, "Crack" cocaine   Comment: none in 15 yrs    Social History   Socioeconomic History   Marital status: Widowed    Spouse name: Not on file   Number of children: Not on file   Years of education: Not on file   Highest education level: Not on file  Occupational History   Not on file  Tobacco Use   Smoking status: Every Day    Current packs/day: 0.15    Average packs/day: 0.2 packs/day for 45.0 years (6.8 ttl pk-yrs)    Types: Cigarettes   Smokeless tobacco: Never  Vaping Use   Vaping status: Never Used  Substance and Sexual Activity   Alcohol use: Not Currently    Alcohol/week: 75.0 standard drinks of alcohol    Types: 75 Cans of beer per week  Drug use: Not Currently    Types: Marijuana, "Crack" cocaine    Comment: none in 15 yrs   Sexual activity: Not Currently  Other Topics Concern   Not on file  Social History Narrative   Not on file   Social Determinants of Health   Financial Resource Strain: Not on file  Food Insecurity: No Food Insecurity (06/16/2023)   Hunger Vital Sign    Worried About Running Out of Food in the Last Year: Never true    Ran Out of Food in the Last Year: Never true  Transportation Needs: No Transportation Needs (06/16/2023)   PRAPARE - Administrator, Civil Service (Medical): No    Lack of Transportation (Non-Medical): No  Physical Activity: Not  on file  Stress: Not on file  Social Connections: Not on file   Additional Social History:                         Sleep: Good  Appetite:  Good  Current Medications: Current Facility-Administered Medications  Medication Dose Route Frequency Provider Last Rate Last Admin   acetaminophen (TYLENOL) tablet 650 mg  650 mg Oral Q6H PRN Penn, Cicely, NP       albuterol (VENTOLIN HFA) 108 (90 Base) MCG/ACT inhaler 1-2 puff  1-2 puff Inhalation Q6H PRN Enedina Finner, MD   1 puff at 06/27/23 1455   alum & mag hydroxide-simeth (MAALOX/MYLANTA) 200-200-20 MG/5ML suspension 30 mL  30 mL Oral Q4H PRN Penn, Cranston Neighbor, NP       aspirin EC tablet 81 mg  81 mg Oral Daily Sarina Ill, DO   81 mg at 06/29/23 0924   chlorpheniramine-HYDROcodone (TUSSIONEX) 10-8 MG/5ML suspension 5 mL  5 mL Oral Q12H PRN Mansy, Jan A, MD       clopidogrel (PLAVIX) tablet 75 mg  75 mg Oral Daily Sarina Ill, DO   75 mg at 06/29/23 1610   diltiazem (CARDIZEM CD) 24 hr capsule 240 mg  240 mg Oral Daily Sarina Ill, DO   240 mg at 06/29/23 9604   diphenhydrAMINE (BENADRYL) capsule 50 mg  50 mg Oral TID PRN Mcneil Sober, NP       Or   diphenhydrAMINE (BENADRYL) injection 50 mg  50 mg Intramuscular TID PRN Mcneil Sober, NP       famotidine (PEPCID) tablet 20 mg  20 mg Oral Daily Sarina Ill, DO   20 mg at 06/29/23 0924   FLUoxetine (PROZAC) capsule 20 mg  20 mg Oral Daily Sarina Ill, DO   20 mg at 06/29/23 5409   guaiFENesin (MUCINEX) 12 hr tablet 600 mg  600 mg Oral BID Mansy, Jan A, MD   600 mg at 06/29/23 8119   haloperidol (HALDOL) tablet 5 mg  5 mg Oral TID PRN Mcneil Sober, NP       Or   haloperidol lactate (HALDOL) injection 5 mg  5 mg Intramuscular TID PRN Mcneil Sober, NP       hydrOXYzine (ATARAX) tablet 10 mg  10 mg Oral Q6H PRN Lewanda Rife, MD   10 mg at 06/28/23 1014   ipratropium-albuterol (DUONEB) 0.5-2.5 (3) MG/3ML nebulizer solution 3 mL  3  mL Nebulization TID Enedina Finner, MD   3 mL at 06/29/23 0924   LORazepam (ATIVAN) tablet 2 mg  2 mg Oral TID PRN Mcneil Sober, NP       Or   LORazepam (ATIVAN) injection 2 mg  2 mg Intramuscular TID PRN Mcneil Sober, NP       magnesium hydroxide (MILK OF MAGNESIA) suspension 30 mL  30 mL Oral Daily PRN Penn, Cranston Neighbor, NP   30 mL at 06/28/23 1059   mirtazapine (REMERON) tablet 7.5 mg  7.5 mg Oral QHS Sarina Ill, DO   7.5 mg at 06/28/23 2110   montelukast (SINGULAIR) tablet 10 mg  10 mg Oral QHS Sarina Ill, DO   10 mg at 06/28/23 2110   pravastatin (PRAVACHOL) tablet 20 mg  20 mg Oral q1800 Sarina Ill, DO   20 mg at 06/28/23 1752   QUEtiapine (SEROQUEL) tablet 600 mg  600 mg Oral QHS Sarina Ill, DO   600 mg at 06/28/23 2111   traZODone (DESYREL) tablet 200 mg  200 mg Oral QHS PRN Charm Rings, NP   200 mg at 06/28/23 2110   umeclidinium bromide (INCRUSE ELLIPTA) 62.5 MCG/ACT 1 puff  1 puff Inhalation Daily Sarina Ill, DO   1 puff at 06/29/23 1610    Lab Results: No results found for this or any previous visit (from the past 48 hour(s)).  Blood Alcohol level:  Lab Results  Component Value Date   ETH <10 06/13/2023   ETH <10 03/24/2023    Metabolic Disorder Labs: Lab Results  Component Value Date   HGBA1C 5.6 06/17/2023   MPG 114.02 06/17/2023   MPG 119.76 03/26/2023   No results found for: "PROLACTIN" Lab Results  Component Value Date   CHOL 172 06/17/2023   TRIG 102 06/17/2023   HDL 78 06/17/2023   CHOLHDL 2.2 06/17/2023   VLDL 20 06/17/2023   LDLCALC 74 06/17/2023   LDLCALC 51 03/26/2023    Physical Findings: AIMS:  , ,  ,  ,    CIWA:    COWS:     Musculoskeletal: Strength & Muscle Tone: within normal limits Gait & Station: normal Patient leans: N/A  Psychiatric Specialty Exam:  Presentation  General Appearance:  Appropriate for Environment  Eye Contact: Fair  Speech: Clear and  Coherent  Speech Volume: Decreased  Handedness: Right   Mood and Affect  Mood: Anxious; Depressed  Affect: Constricted   Thought Process  Thought Processes: Coherent; Goal Directed; Linear  Descriptions of Associations:Intact  Orientation:Full (Time, Place and Person)  Thought Content:Logical  History of Schizophrenia/Schizoaffective disorder:Yes  Duration of Psychotic Symptoms:Greater than six months  Hallucinations:No data recorded Ideas of Reference:Paranoia; Other (comment) ("just anxious")  Suicidal Thoughts:No data recorded Homicidal Thoughts:No data recorded  Sensorium  Memory: Immediate Fair  Judgment: Intact  Insight: Present   Executive Functions  Concentration: Fair  Attention Span: Fair  Recall: [employer] of Knowledge: Fair  Language: Fair   Psychomotor Activity  Psychomotor Activity:No data recorded  Assets  Assets: Communication Skills; Desire for Improvement; Housing   Sleep  Sleep:No data recorded    Blood pressure 107/72, pulse 95, temperature 97.8 F (36.6 C), resp. rate 15, height 6\' 1"  (1.854 m), weight 100.2 kg, SpO2 94%. Body mass index is 29.16 kg/m.   Treatment Plan Summary: Daily contact with patient to assess and evaluate symptoms and progress in treatment, Medication management, and Plan continue current medication.  Discharge tomorrow.  Sarina Ill, DO 06/29/2023, 12:59 PM

## 2023-06-29 NOTE — Progress Notes (Signed)
   06/29/23 0559  15 Minute Checks  Location Bedroom  Visual Appearance Calm  Behavior Sleeping  Sleep (Behavioral Health Patients Only)  Calculate sleep? (Click Yes once per 24 hr at 0600 safety check) Yes  Documented sleep last 24 hours 13

## 2023-06-29 NOTE — Group Note (Signed)
Date:  06/29/2023 Time:  11:55 AM  Group Topic/Focus:  Building Self Esteem:   The Focus of this group is helping patients become aware of the effects of self-esteem on their lives, the things they and others do that enhance or undermine their self-esteem, seeing the relationship between their level of self-esteem and the choices they make and learning ways to enhance self-esteem. Coping With Mental Health Crisis:   The purpose of this group is to help patients identify strategies for coping with mental health crisis.  Group discusses possible causes of crisis and ways to manage them effectively. Managing Feelings:   The focus of this group is to identify what feelings patients have difficulty handling and develop a plan to handle them in a healthier way upon discharge.    Participation Level:  Active  Participation Quality:  Appropriate, Attentive, Sharing, and Supportive  Affect:  Appropriate  Cognitive:  Alert  Insight: Improving  Engagement in Group:  Engaged and Supportive  Modes of Intervention:  Discussion  Additional Comments:     Alexis Frock 06/29/2023, 11:55 AM

## 2023-06-29 NOTE — Group Note (Signed)
Date:  06/29/2023 Time:  8:49 PM  Group Topic/Focus:  Making Healthy Choices:   The focus of this group is to help patients identify negative/unhealthy choices they were using prior to admission and identify positive/healthier coping strategies to replace them upon discharge.    Participation Level:  Active  Participation Quality:  Appropriate  Affect:  Appropriate  Cognitive:  Appropriate  Insight: Good  Engagement in Group:  Engaged  Modes of Intervention:  Discussion  Additional Comments:    Burt Ek 06/29/2023, 8:49 PM

## 2023-06-29 NOTE — Plan of Care (Signed)
D: Pt alert and oriented. Pt endorses experiencing depression however, denies experiencing any anxiety at this time. Pt denies experiencing any pain at this time. Pt denies experiencing any SI/HI, or AVH at this time.   A: Scheduled medications administered to pt, per MD orders. Support and encouragement provided. Frequent verbal contact made. Routine safety checks conducted q15 minutes.   R: No adverse drug reactions noted. Pt verbally contracts for safety at this time. Pt compliant with medications and treatment plan. Pt interacts minimally with others on the unit, mostly self isolating to room. Pt remains safe at this time. Plan of care ongoing.  Problem: Activity: Goal: Interest or engagement in activities will improve Outcome: Not Progressing   Problem: Activity: Goal: Interest or engagement in leisure activities will improve Outcome: Not Progressing

## 2023-06-29 NOTE — BHH Counselor (Signed)
CSW received return call from Ms.Jackie at patient's group home.   Ms.Jackie reports pt may not be able to return to group home until she completes another assessment and that she had no transportation for him until Monday.  CSW [place] asked if pt could come if hospital provided transportation. CSW [place] did inform Ms.Jackie that pt has no more covered days and that pt would have a bill for his stay past tomorrow.   Ms.Jackie reports he cannot come as she needs to know more about his breathing treatment and pt may need a higher level of care.   CSW [place] asked CSW [place]com[place] if she could give Ms.Jackie a call because they have a good rapport.   CSW [place]com[place] reports Ms.Jackie said she still needs to do an assessment and that it cannot be done until Monday.   CSW [place] will inform provider.   Reynaldo Minium, MSW, [place] 06/29/2023 5:00 PM

## 2023-06-29 NOTE — Group Note (Unsigned)
Date:  06/29/2023 Time:  8:36 PM  Group Topic/Focus:  Making Healthy Choices:   The focus of this group is to help patients identify negative/unhealthy choices they were using prior to admission and identify positive/healthier coping strategies to replace them upon discharge.     Participation Level:  {BHH PARTICIPATION ZOXWR:60454}  Participation Quality:  {BHH PARTICIPATION QUALITY:22265}  Affect:  {BHH AFFECT:22266}  Cognitive:  {BHH COGNITIVE:22267}  Insight: {BHH Insight2:20797}  Engagement in Group:  {BHH ENGAGEMENT IN UJWJX:91478}  Modes of Intervention:  {BHH MODES OF INTERVENTION:22269}  Additional Comments:  ***  Burt Ek 06/29/2023, 8:36 PM

## 2023-06-29 NOTE — Group Note (Signed)
Recreation Therapy Group Note   Group Topic:Problem Solving  Group Date: 06/29/2023 Start Time: 1400 End Time: 1500 Facilitators: Rosina Lowenstein, LRT, CTRS Location:  Day Room  Group Description: Life Boat. Patients were given the scenario that they are on a boat that is about to become shipwrecked, leaving them stranded on an [place]. They are asked to make a list of 15 different items that they want to take with them when they are stranded on the [place]. Patients are asked to rank their items from most important to least important, #1 being the most important and #15 being the least. Patients will work individually for the first round to come up with 15 items and then pair up with a peer(s) to condense their list and come up with one list of 15 items between the two of them. Patients or LRT will read aloud the 15 different items to the group after each round. LRT facilitated post-activity processing to discuss how this activity can be used in daily life post discharge.   Goal Area(s) Addressed:  Patient will identify priorities, wants and needs. Patient will communicate with LRT and peers. Patient will work collectively as a Administrator, Civil Service. Patient will work on Product manager.    Affect/Mood: N/A    Clinical Observations/Individualized Feedback: [place] did not attend group.   Plan: Continue to engage patient in RT group sessions 2-3x/week.   Rosina Lowenstein, LRT, CTRS 06/29/2023 3:41 PM

## 2023-06-30 LAB — GLUCOSE, CAPILLARY: Glucose-Capillary: 155 mg/dL — ABNORMAL HIGH (ref 70–99)

## 2023-06-30 NOTE — Progress Notes (Signed)
   06/30/23 0500  15 Minute Checks  Location Bedroom  Visual Appearance Calm  Behavior Sleeping  Sleep (Behavioral Health Patients Only)  Calculate sleep? (Click Yes once per 24 hr at 0600 safety check) Yes  Documented sleep last 24 hours 11

## 2023-06-30 NOTE — Group Note (Signed)
Recreation Therapy Group Note   Group Topic:Coping Skills  Group Date: 06/30/2023 Start Time: 1400 End Time: 1450 Facilitators: Rosina Lowenstein, LRT, CTRS Location: Dayroom  Group Description: Meditation. LRT and patients discussed what they know about meditation and mindfulness. LRT played a Deep Breathing Meditation exercise script for patients to follow along to. LRT and patients discussed how meditation and deep breathing can be used as a coping skill post--discharge.   Goal Area(s) Addressed: Patient will practice using relaxation technique. Patient will identify a new coping skill.  Patient will follow multistep directions to reduce anxiety and stress.   Affect/Mood: N/A   Participation Level: Did not attend    Clinical Observations/Individualized Feedback: [name] did not attend group.   Plan: Continue to engage patient in RT group sessions 2-3x/week.   Rosina Lowenstein, LRT, CTRS 06/30/2023 3:04 PM

## 2023-06-30 NOTE — Group Note (Signed)
Date:  06/30/2023 Time:  10:52 AM  Group Topic/Focus:  Overcoming Stress:   The focus of this group is to define stress and help patients assess their triggers.    Participation Level:  Did Not Attend   Ardelle Anton 06/30/2023, 10:52 AM

## 2023-06-30 NOTE — Group Note (Signed)
Date:  06/30/2023 Time:  9:11 PM  Group Topic/Focus:  Developing a Wellness Toolbox:   The focus of this group is to help patients develop a "wellness toolbox" with skills and strategies to promote recovery upon discharge.    Participation Level:  Active  Participation Quality:  Appropriate  Affect:  Appropriate  Cognitive:  Appropriate  Insight: Appropriate  Engagement in Group:  Engaged  Modes of Intervention:  Education  Additional Comments:    Garry Heater 06/30/2023, 9:11 PM

## 2023-06-30 NOTE — Plan of Care (Signed)
Problem: Education: Goal: Ability to state activities that reduce stress will improve Outcome: Progressing   Problem: Coping: Goal: Ability to identify and develop effective coping behavior will improve Outcome: Progressing

## 2023-06-30 NOTE — Progress Notes (Signed)
   06/30/23 1400  Psych Admission Type (Psych Patients Only)  Admission Status Voluntary  Psychosocial Assessment  Patient Complaints Depression  Eye Contact Fair  Facial Expression Flat  Affect Flat  Speech Logical/coherent  Interaction Minimal  Motor Activity Slow  Appearance/Hygiene In scrubs  Behavior Characteristics Cooperative  Mood Sad  Thought Process  Coherency WDL  Content WDL  Delusions None reported or observed  Perception WDL  Hallucination None reported or observed  Judgment Impaired  Confusion None  Danger to Self  Current suicidal ideation? Denies  Agreement Not to Harm Self Yes  Description of Agreement verbal

## 2023-06-30 NOTE — Progress Notes (Signed)
[facility] MD Progress Note  06/30/2023 3:02 PM [name]  MRN:  [id number] Subjective: [name] is seen on rounds.  I told him that he is not leaving today because the group home must come in and reassess him.  He was okay with that.  He is breathing better and his mood is better and his sleep is better. Principal Problem: Undifferentiated schizophrenia (HCC) Diagnosis: Principal Problem:   Undifferentiated schizophrenia (HCC) Active Problems:   COPD with acute exacerbation (HCC)   Essential hypertension   Dyslipidemia   Anxiety and depression  Total Time spent with patient: 15 minutes  Past Psychiatric History: Schizophrenia  Past Medical History:  Past Medical History:  Diagnosis Date   Acute on chronic respiratory failure with hypoxia (HCC)    Asthma    Coagulation disorder (HCC)    COPD (chronic obstructive pulmonary disease) (HCC)    Depression    History of hiatal hernia    Hypertension    Hypokalemia    Leukocytosis    Lung mass    Pneumonia    Schizophrenia (HCC)    Shortness of breath dyspnea    Stroke [facility])    Umbilical hernia    Ventral hernia     Past Surgical History:  Procedure Laterality Date   COLONOSCOPY WITH PROPOFOL N/A 09/21/2021   Procedure: COLONOSCOPY WITH PROPOFOL;  Surgeon: Regis Bill, MD;  Location: [facility] ENDOSCOPY;  Service: Endoscopy;  Laterality: N/A;   HERNIA REPAIR     INSERTION OF MESH N/A 12/18/2014   Procedure: INSERTION OF MESH;  Surgeon: Lattie Haw, MD;  Location: [facility] ORS;  Service: General;  Laterality: N/A;   SUPRA-UMBILICAL HERNIA  12/18/2014   Procedure: SUPRA-UMBILICAL HERNIA;  Surgeon: Lattie Haw, MD;  Location: [facility] ORS;  Service: General;;   UMBILICAL HERNIA REPAIR N/A 12/18/2014   Procedure: HERNIA REPAIR UMBILICAL ADULT;  Surgeon: Lattie Haw, MD;  Location: [facility] ORS;  Service: General;  Laterality: N/A;   VIDEO BRONCHOSCOPY WITH ENDOBRONCHIAL ULTRASOUND N/A 03/15/2023   Procedure: VIDEO BRONCHOSCOPY WITH  ENDOBRONCHIAL ULTRASOUND;  Surgeon: Vida Rigger, MD;  Location: [facility] ORS;  Service: Thoracic;  Laterality: N/A;   Family History:  Family History  Problem Relation Age of Onset   Asthma Mother    Hypertension Father    Family Psychiatric  History: Unremarkable Social History:  Social History   Substance and Sexual Activity  Alcohol Use Not Currently   Alcohol/week: 75.0 standard drinks of alcohol   Types: 75 Cans of beer per week     Social History   Substance and Sexual Activity  Drug Use Not Currently   Types: Marijuana, "Crack" cocaine   Comment: none in 15 yrs    Social History   Socioeconomic History   Marital status: Widowed    Spouse name: Not on file   Number of children: Not on file   Years of education: Not on file   Highest education level: Not on file  Occupational History   Not on file  Tobacco Use   Smoking status: Every Day    Current packs/day: 0.15    Average packs/day: 0.2 packs/day for 45.0 years (6.8 ttl pk-yrs)    Types: Cigarettes   Smokeless tobacco: Never  Vaping Use   Vaping status: Never Used  Substance and Sexual Activity   Alcohol use: Not Currently    Alcohol/week: 75.0 standard drinks of alcohol    Types: 75 Cans of beer per week   Drug use: Not Currently  Types: Marijuana, "Crack" cocaine    Comment: none in 15 yrs   Sexual activity: Not Currently  Other Topics Concern   Not on file  Social History Narrative   Not on file   Social Determinants of Health   Financial Resource Strain: Not on file  Food Insecurity: No Food Insecurity (06/16/2023)   Hunger Vital Sign    Worried About Running Out of Food in the Last Year: Never true    Ran Out of Food in the Last Year: Never true  Transportation Needs: No Transportation Needs (06/16/2023)   PRAPARE - Administrator, Civil Service (Medical): No    Lack of Transportation (Non-Medical): No  Physical Activity: Not on file  Stress: Not on file  Social Connections:  Not on file   Additional Social History:                         Sleep: Good  Appetite:  Good  Current Medications: Current Facility-Administered Medications  Medication Dose Route Frequency Provider Last Rate Last Admin   acetaminophen (TYLENOL) tablet 650 mg  650 mg Oral Q6H PRN Penn, Cicely, NP       albuterol (VENTOLIN HFA) 108 (90 Base) MCG/ACT inhaler 1-2 puff  1-2 puff Inhalation Q6H PRN Enedina Finner, MD   1 puff at 06/27/23 1455   alum & mag hydroxide-simeth (MAALOX/MYLANTA) 200-200-20 MG/5ML suspension 30 mL  30 mL Oral Q4H PRN Penn, Cranston Neighbor, NP       aspirin EC tablet 81 mg  81 mg Oral Daily Sarina Ill, DO   81 mg at 06/30/23 1000   chlorpheniramine-HYDROcodone (TUSSIONEX) 10-8 MG/5ML suspension 5 mL  5 mL Oral Q12H PRN Mansy, Jan A, MD       clopidogrel (PLAVIX) tablet 75 mg  75 mg Oral Daily Sarina Ill, DO   75 mg at 06/30/23 1000   diltiazem (CARDIZEM CD) 24 hr capsule 240 mg  240 mg Oral Daily Sarina Ill, DO   240 mg at 06/30/23 1000   diphenhydrAMINE (BENADRYL) capsule 50 mg  50 mg Oral TID PRN Mcneil Sober, NP       Or   diphenhydrAMINE (BENADRYL) injection 50 mg  50 mg Intramuscular TID PRN Mcneil Sober, NP       famotidine (PEPCID) tablet 20 mg  20 mg Oral Daily Sarina Ill, DO   20 mg at 06/30/23 1000   FLUoxetine (PROZAC) capsule 20 mg  20 mg Oral Daily Sarina Ill, DO   20 mg at 06/30/23 1000   guaiFENesin (MUCINEX) 12 hr tablet 600 mg  600 mg Oral BID Mansy, Jan A, MD   600 mg at 06/30/23 1000   haloperidol (HALDOL) tablet 5 mg  5 mg Oral TID PRN Mcneil Sober, NP       Or   haloperidol lactate (HALDOL) injection 5 mg  5 mg Intramuscular TID PRN Mcneil Sober, NP       hydrOXYzine (ATARAX) tablet 10 mg  10 mg Oral Q6H PRN Lewanda Rife, MD   10 mg at 06/28/23 1014   ipratropium-albuterol (DUONEB) 0.5-2.5 (3) MG/3ML nebulizer solution 3 mL  3 mL Nebulization TID Enedina Finner, MD   3 mL at  06/30/23 1000   LORazepam (ATIVAN) tablet 2 mg  2 mg Oral TID PRN Mcneil Sober, NP       Or   LORazepam (ATIVAN) injection 2 mg  2 mg Intramuscular TID PRN Penn,  Cranston Neighbor, NP       magnesium hydroxide (MILK OF MAGNESIA) suspension 30 mL  30 mL Oral Daily PRN Penn, Cranston Neighbor, NP   30 mL at 06/28/23 1059   mirtazapine (REMERON) tablet 7.5 mg  7.5 mg Oral QHS Sarina Ill, DO   7.5 mg at 06/29/23 2054   montelukast (SINGULAIR) tablet 10 mg  10 mg Oral QHS Sarina Ill, DO   10 mg at 06/29/23 2054   pravastatin (PRAVACHOL) tablet 20 mg  20 mg Oral q1800 Sarina Ill, DO   20 mg at 06/29/23 1711   QUEtiapine (SEROQUEL) tablet 600 mg  600 mg Oral QHS Sarina Ill, DO   600 mg at 06/29/23 2054   traZODone (DESYREL) tablet 200 mg  200 mg Oral QHS PRN Charm Rings, NP   200 mg at 06/29/23 2054   umeclidinium bromide (INCRUSE ELLIPTA) 62.5 MCG/ACT 1 puff  1 puff Inhalation Daily Sarina Ill, DO   1 puff at 06/30/23 0940    Lab Results:  Results for orders placed or performed during the hospital encounter of 06/16/23 (from the past 48 hour(s))  Glucose, capillary     Status: Abnormal   Collection Time: 06/30/23  7:35 AM  Result Value Ref Range   Glucose-Capillary 155 (H) 70 - 99 mg/dL    Comment: Glucose reference range applies only to samples taken after fasting for at least 8 hours.    Blood Alcohol level:  Lab Results  Component Value Date   ETH <10 06/13/2023   ETH <10 03/24/2023    Metabolic Disorder Labs: Lab Results  Component Value Date   HGBA1C 5.6 06/17/2023   MPG 114.02 06/17/2023   MPG 119.76 03/26/2023   No results found for: "PROLACTIN" Lab Results  Component Value Date   CHOL 172 06/17/2023   TRIG 102 06/17/2023   HDL 78 06/17/2023   CHOLHDL 2.2 06/17/2023   VLDL 20 06/17/2023   LDLCALC 74 06/17/2023   LDLCALC 51 03/26/2023    Physical Findings: AIMS:  , ,  ,  ,    CIWA:    COWS:      Musculoskeletal: Strength & Muscle Tone: within normal limits Gait & Station: normal Patient leans: N/A  Psychiatric Specialty Exam:  Presentation  General Appearance:  Appropriate for Environment  Eye Contact: Fair  Speech: Clear and Coherent  Speech Volume: Decreased  Handedness: Right   Mood and Affect  Mood: Anxious; Depressed  Affect: Constricted   Thought Process  Thought Processes: Coherent; Goal Directed; Linear  Descriptions of Associations:Intact  Orientation:Full (Time, Place and Person)  Thought Content:Logical  History of Schizophrenia/Schizoaffective disorder:Yes  Duration of Psychotic Symptoms:Greater than six months  Hallucinations:No data recorded Ideas of Reference:Paranoia; Other (comment) ("just anxious")  Suicidal Thoughts:No data recorded Homicidal Thoughts:No data recorded  Sensorium  Memory: Immediate Fair  Judgment: Intact  Insight: Present   Executive Functions  Concentration: Fair  Attention Span: Fair  Recall: [employer] of Knowledge: Fair  Language: Fair   Psychomotor Activity  Psychomotor Activity:No data recorded  Assets  Assets: Communication Skills; Desire for Improvement; Housing   Sleep  Sleep:No data recorded    Blood pressure 109/72, pulse 98, temperature 98.6 F (37 C), resp. rate 16, height 6\' 1"  (1.854 m), weight 100.2 kg, SpO2 (!) 85%. Body mass index is 29.16 kg/m.   Treatment Plan Summary: Daily contact with patient to assess and evaluate symptoms and progress in treatment, Medication management, and Plan continue  current medications.  Plan on discharge Tuesday.  Sarina Ill, DO 06/30/2023, 3:02 PM

## 2023-06-30 NOTE — Progress Notes (Signed)
Assumed care of patient this pm, he present A&O x3 , affect appropriate, brighter mood. Pt out in the milieu, at intervals, minimum socialization with others behavior appropriate to situation and was without complaints. Pt denied thoughts, plan or intent to harm self or others and made a safety commitment, "I'm during fine, I don't have any problems". Pt assessed for respiratory distress, noted noted and he stated he's not currently having any distress.  Pt is compliant with taking medications and following plan of care. Pt educated on plan of care, medication regimen and he acknowledged an understanding and was without  complaints or concerns. Pt maintained on q 15 min rounds for safety and support.

## 2023-06-30 NOTE — Plan of Care (Signed)
Problem: Education: Goal: Knowledge of [facility] Education information/materials will improve Outcome: Progressing Goal: Emotional status will improve Outcome: Progressing Goal: Mental status will improve Outcome: Progressing Goal: Verbalization of understanding the information provided will improve Outcome: Progressing   Problem: Activity: Goal: Interest or engagement in activities will improve Outcome: Progressing Goal: Sleeping patterns will improve Outcome: Progressing   Problem: Coping: Goal: Ability to verbalize frustrations and anger appropriately will improve Outcome: Progressing Goal: Ability to demonstrate self-control will improve Outcome: Progressing   Problem: Health Behavior/Discharge Planning: Goal: Identification of resources available to assist in meeting health care needs will improve Outcome: Progressing Goal: Compliance with treatment plan for underlying cause of condition will improve Outcome: Progressing

## 2023-07-01 NOTE — Plan of Care (Signed)
Problem: Health Behavior/Discharge Planning: Goal: Identification of resources available to assist in meeting health care needs will improve Outcome: Progressing Goal: Compliance with treatment plan for underlying cause of condition will improve Outcome: Progressing

## 2023-07-01 NOTE — Group Note (Signed)
LCSW Group Therapy Note   Group Date: 07/01/2023 Start Time: 1310 End Time: 1340   Type of Therapy and Topic:  Group Therapy: Geriatric Depression  Participation Level:  Did Not Attend   Summary of Patient Progress:    Patient did not attend    Azucena Kuba, LCSWA 07/01/2023  4:50 PM

## 2023-07-01 NOTE — Progress Notes (Signed)
   07/01/23 1400  Psych Admission Type (Psych Patients Only)  Admission Status Voluntary  Psychosocial Assessment  Patient Complaints None  Eye Contact Fair  Facial Expression Flat  Affect Flat  Speech Logical/coherent  Interaction Minimal  Motor Activity Slow  Appearance/Hygiene In scrubs  Behavior Characteristics Cooperative  Mood Sad  Thought Process  Coherency WDL  Content WDL  Delusions None reported or observed  Perception WDL  Hallucination None reported or observed  Judgment Impaired  Confusion None  Danger to Self  Current suicidal ideation? Denies  Agreement Not to Harm Self Yes  Description of Agreement verbal  Danger to Others  Danger to Others None reported or observed

## 2023-07-01 NOTE — Progress Notes (Signed)
[facility] MD Progress Note  07/01/2023 12:34 PM [name]  MRN:  [id number] Subjective: [name] seen on rounds.  He states he is sleeping better.  He seems to be more comfortable.  Nurses report no issues.  He denies any side effects.  His group home needs to reevaluate him on Monday and hopefully discharge on Principal Problem: Undifferentiated schizophrenia (HCC) Diagnosis: Principal Problem:   Undifferentiated schizophrenia (HCC) Active Problems:   COPD with acute exacerbation (HCC)   Essential hypertension   Dyslipidemia   Anxiety and depression  Total Time spent with patient: 15 minutes  Past Psychiatric History: Schizophrenia  Past Medical History:  Past Medical History:  Diagnosis Date   Acute on chronic respiratory failure with hypoxia (HCC)    Asthma    Coagulation disorder (HCC)    COPD (chronic obstructive pulmonary disease) (HCC)    Depression    History of hiatal hernia    Hypertension    Hypokalemia    Leukocytosis    Lung mass    Pneumonia    Schizophrenia (HCC)    Shortness of breath dyspnea    Stroke [facility])    Umbilical hernia    Ventral hernia     Past Surgical History:  Procedure Laterality Date   COLONOSCOPY WITH PROPOFOL N/A 09/21/2021   Procedure: COLONOSCOPY WITH PROPOFOL;  Surgeon: Regis Bill, MD;  Location: [facility] ENDOSCOPY;  Service: Endoscopy;  Laterality: N/A;   HERNIA REPAIR     INSERTION OF MESH N/A 12/18/2014   Procedure: INSERTION OF MESH;  Surgeon: Lattie Haw, MD;  Location: [facility] ORS;  Service: General;  Laterality: N/A;   SUPRA-UMBILICAL HERNIA  12/18/2014   Procedure: SUPRA-UMBILICAL HERNIA;  Surgeon: Lattie Haw, MD;  Location: [facility] ORS;  Service: General;;   UMBILICAL HERNIA REPAIR N/A 12/18/2014   Procedure: HERNIA REPAIR UMBILICAL ADULT;  Surgeon: Lattie Haw, MD;  Location: [facility] ORS;  Service: General;  Laterality: N/A;   VIDEO BRONCHOSCOPY WITH ENDOBRONCHIAL ULTRASOUND N/A 03/15/2023   Procedure: VIDEO BRONCHOSCOPY  WITH ENDOBRONCHIAL ULTRASOUND;  Surgeon: Chavez Rigger, MD;  Location: [facility] ORS;  Service: Thoracic;  Laterality: N/A;   Family History:  Family History  Problem Relation Age of Onset   Asthma Mother    Hypertension Father    Family Psychiatric  History: Unremarkable Social History:  Social History   Substance and Sexual Activity  Alcohol Use Not Currently   Alcohol/week: 75.0 standard drinks of alcohol   Types: 75 Cans of beer per week     Social History   Substance and Sexual Activity  Drug Use Not Currently   Types: Marijuana, "Crack" cocaine   Comment: none in 15 yrs    Social History   Socioeconomic History   Marital status: Widowed    Spouse name: Not on file   Number of children: Not on file   Years of education: Not on file   Highest education level: Not on file  Occupational History   Not on file  Tobacco Use   Smoking status: Every Day    Current packs/day: 0.15    Average packs/day: 0.2 packs/day for 45.0 years (6.8 ttl pk-yrs)    Types: Cigarettes   Smokeless tobacco: Never  Vaping Use   Vaping status: Never Used  Substance and Sexual Activity   Alcohol use: Not Currently    Alcohol/week: 75.0 standard drinks of alcohol    Types: 75 Cans of beer per week   Drug use: Not Currently  Types: Marijuana, "Crack" cocaine    Comment: none in 15 yrs   Sexual activity: Not Currently  Other Topics Concern   Not on file  Social History Narrative   Not on file   Social Determinants of Health   Financial Resource Strain: Not on file  Food Insecurity: No Food Insecurity (06/16/2023)   Hunger Vital Sign    Worried About Running Out of Food in the Last Year: Never true    Ran Out of Food in the Last Year: Never true  Transportation Needs: No Transportation Needs (06/16/2023)   PRAPARE - Administrator, Civil Service (Medical): No    Lack of Transportation (Non-Medical): No  Physical Activity: Not on file  Stress: Not on file  Social  Connections: Not on file   Additional Social History:                         Sleep: Good  Appetite:  Good  Current Medications: Current Facility-Administered Medications  Medication Dose Route Frequency Provider Last Rate Last Admin   acetaminophen (TYLENOL) tablet 650 mg  650 mg Oral Q6H PRN Penn, Cicely, NP       albuterol (VENTOLIN HFA) 108 (90 Base) MCG/ACT inhaler 1-2 puff  1-2 puff Inhalation Q6H PRN Enedina Finner, MD   1 puff at 06/27/23 1455   alum & mag hydroxide-simeth (MAALOX/MYLANTA) 200-200-20 MG/5ML suspension 30 mL  30 mL Oral Q4H PRN Penn, Cranston Neighbor, NP       aspirin EC tablet 81 mg  81 mg Oral Daily Sarina Ill, DO   81 mg at 07/01/23 0919   chlorpheniramine-HYDROcodone (TUSSIONEX) 10-8 MG/5ML suspension 5 mL  5 mL Oral Q12H PRN Mansy, Jan A, MD       clopidogrel (PLAVIX) tablet 75 mg  75 mg Oral Daily Sarina Ill, DO   75 mg at 07/01/23 0919   diltiazem (CARDIZEM CD) 24 hr capsule 240 mg  240 mg Oral Daily Sarina Ill, DO   240 mg at 07/01/23 0919   diphenhydrAMINE (BENADRYL) capsule 50 mg  50 mg Oral TID PRN Mcneil Sober, NP       Or   diphenhydrAMINE (BENADRYL) injection 50 mg  50 mg Intramuscular TID PRN Mcneil Sober, NP       famotidine (PEPCID) tablet 20 mg  20 mg Oral Daily Sarina Ill, DO   20 mg at 07/01/23 0919   FLUoxetine (PROZAC) capsule 20 mg  20 mg Oral Daily Sarina Ill, DO   20 mg at 07/01/23 0919   guaiFENesin (MUCINEX) 12 hr tablet 600 mg  600 mg Oral BID Mansy, Jan A, MD   600 mg at 07/01/23 4098   haloperidol (HALDOL) tablet 5 mg  5 mg Oral TID PRN Mcneil Sober, NP       Or   haloperidol lactate (HALDOL) injection 5 mg  5 mg Intramuscular TID PRN Mcneil Sober, NP       hydrOXYzine (ATARAX) tablet 10 mg  10 mg Oral Q6H PRN Lewanda Rife, MD   10 mg at 06/28/23 1014   ipratropium-albuterol (DUONEB) 0.5-2.5 (3) MG/3ML nebulizer solution 3 mL  3 mL Nebulization TID Enedina Finner, MD   3  mL at 07/01/23 0919   LORazepam (ATIVAN) tablet 2 mg  2 mg Oral TID PRN Mcneil Sober, NP       Or   LORazepam (ATIVAN) injection 2 mg  2 mg Intramuscular TID PRN Penn,  Cranston Neighbor, NP       magnesium hydroxide (MILK OF MAGNESIA) suspension 30 mL  30 mL Oral Daily PRN Penn, Cranston Neighbor, NP   30 mL at 06/28/23 1059   mirtazapine (REMERON) tablet 7.5 mg  7.5 mg Oral QHS Sarina Ill, DO   7.5 mg at 06/30/23 2144   montelukast (SINGULAIR) tablet 10 mg  10 mg Oral QHS Sarina Ill, DO   10 mg at 06/30/23 2146   pravastatin (PRAVACHOL) tablet 20 mg  20 mg Oral q1800 Sarina Ill, DO   20 mg at 06/30/23 1630   QUEtiapine (SEROQUEL) tablet 600 mg  600 mg Oral QHS Sarina Ill, DO   600 mg at 06/30/23 2145   traZODone (DESYREL) tablet 200 mg  200 mg Oral QHS PRN Charm Rings, NP   200 mg at 06/30/23 2144   umeclidinium bromide (INCRUSE ELLIPTA) 62.5 MCG/ACT 1 puff  1 puff Inhalation Daily Sarina Ill, DO   1 puff at 07/01/23 1610    Lab Results:  Results for orders placed or performed during the hospital encounter of 06/16/23 (from the past 48 hour(s))  Glucose, capillary     Status: Abnormal   Collection Time: 06/30/23  7:35 AM  Result Value Ref Range   Glucose-Capillary 155 (H) 70 - 99 mg/dL    Comment: Glucose reference range applies only to samples taken after fasting for at least 8 hours.    Blood Alcohol level:  Lab Results  Component Value Date   ETH <10 06/13/2023   ETH <10 03/24/2023    Metabolic Disorder Labs: Lab Results  Component Value Date   HGBA1C 5.6 06/17/2023   MPG 114.02 06/17/2023   MPG 119.76 03/26/2023   No results found for: "PROLACTIN" Lab Results  Component Value Date   CHOL 172 06/17/2023   TRIG 102 06/17/2023   HDL 78 06/17/2023   CHOLHDL 2.2 06/17/2023   VLDL 20 06/17/2023   LDLCALC 74 06/17/2023   LDLCALC 51 03/26/2023    Physical Findings: AIMS:  , ,  ,  ,    CIWA:    COWS:      Musculoskeletal: Strength & Muscle Tone: within normal limits Gait & Station: normal Patient leans: N/A  Psychiatric Specialty Exam:  Presentation  General Appearance:  Appropriate for Environment  Eye Contact: Fair  Speech: Clear and Coherent  Speech Volume: Decreased  Handedness: Right   Mood and Affect  Mood: Anxious; Depressed  Affect: Constricted   Thought Process  Thought Processes: Coherent; Goal Directed; Linear  Descriptions of Associations:Intact  Orientation:Full (Time, Place and Person)  Thought Content:Logical  History of Schizophrenia/Schizoaffective disorder:Yes  Duration of Psychotic Symptoms:Greater than six months  Hallucinations:No data recorded Ideas of Reference:Paranoia; Other (comment) ("just anxious")  Suicidal Thoughts:No data recorded Homicidal Thoughts:No data recorded  Sensorium  Memory: Immediate Fair  Judgment: Intact  Insight: Present   Executive Functions  Concentration: Fair  Attention Span: Fair  Recall: [employer] of Knowledge: Fair  Language: Fair   Psychomotor Activity  Psychomotor Activity:No data recorded  Assets  Assets: Communication Skills; Desire for Improvement; Housing   Sleep  Sleep:No data recorded    Blood pressure 93/74, pulse 99, temperature 98.2 F (36.8 C), resp. rate 20, height 6\' 1"  (1.854 m), weight 100.2 kg, SpO2 92%. Body mass index is 29.16 kg/m.   Treatment Plan Summary: Daily contact with patient to assess and evaluate symptoms and progress in treatment, Medication management, and Plan continue current  medications.  Sarina Ill, DO 07/01/2023, 12:34 PM

## 2023-07-02 MED ORDER — SENNOSIDES-DOCUSATE SODIUM 8.6-50 MG PO TABS
1.0000 | ORAL_TABLET | Freq: Every day | ORAL | Status: DC
Start: 2023-07-02 — End: 2023-07-04
  Administered 2023-07-02 – 2023-07-04 (×3): 1 via ORAL
  Filled 2023-07-02 (×3): qty 1

## 2023-07-02 NOTE — Group Note (Signed)
Date:  07/02/2023 Time:  11:18 AM  Group Topic/Focus:  Coping With Mental Health Crisis:   The purpose of this group is to help patients identify strategies for coping with mental health crisis.  Group discusses possible causes of crisis and ways to manage them effectively. Goals Group:   The focus of this group is to help patients establish daily goals to achieve during treatment and discuss how the patient can incorporate goal setting into their daily lives to aide in recovery. Healthy Communication:   The focus of this group is to discuss communication, barriers to communication, as well as healthy ways to communicate with others.    Participation Level:  Did Not Attend  Participation Quality:    Affect:    Cognitive:    Insight:   Engagement in Group:    Modes of Intervention:    Additional Comments:    [name] L Jeanmarc Viernes 07/02/2023, 11:18 AM

## 2023-07-02 NOTE — Plan of Care (Signed)
  Problem: Education: Goal: Knowledge of [facility] Education information/materials will improve Outcome: Progressing Goal: Emotional status will improve Outcome: Progressing   

## 2023-07-02 NOTE — Progress Notes (Signed)
[facility] MD Progress Note  07/02/2023 12:35 PM [name]  MRN:  [id number] Subjective: [name] is seen on rounds.  He is doing pretty well but complains of constipation.  Has been pleasant and cooperative.  His group home staff is supposed to come in tomorrow and assessment.  He has been in good controls.  Sleeping better and eating better. Principal Problem: Undifferentiated schizophrenia (HCC) Diagnosis: Principal Problem:   Undifferentiated schizophrenia (HCC) Active Problems:   COPD with acute exacerbation (HCC)   Essential hypertension   Dyslipidemia   Anxiety and depression  Total Time spent with patient: 15 minutes  Past Psychiatric History: Schizophrenia  Past Medical History:  Past Medical History:  Diagnosis Date   Acute on chronic respiratory failure with hypoxia (HCC)    Asthma    Coagulation disorder (HCC)    COPD (chronic obstructive pulmonary disease) (HCC)    Depression    History of hiatal hernia    Hypertension    Hypokalemia    Leukocytosis    Lung mass    Pneumonia    Schizophrenia (HCC)    Shortness of breath dyspnea    Stroke [facility])    Umbilical hernia    Ventral hernia     Past Surgical History:  Procedure Laterality Date   COLONOSCOPY WITH PROPOFOL N/A 09/21/2021   Procedure: COLONOSCOPY WITH PROPOFOL;  Surgeon: Regis Bill, MD;  Location: [facility] ENDOSCOPY;  Service: Endoscopy;  Laterality: N/A;   HERNIA REPAIR     INSERTION OF MESH N/A 12/18/2014   Procedure: INSERTION OF MESH;  Surgeon: Lattie Haw, MD;  Location: [facility] ORS;  Service: General;  Laterality: N/A;   SUPRA-UMBILICAL HERNIA  12/18/2014   Procedure: SUPRA-UMBILICAL HERNIA;  Surgeon: Lattie Haw, MD;  Location: [facility] ORS;  Service: General;;   UMBILICAL HERNIA REPAIR N/A 12/18/2014   Procedure: HERNIA REPAIR UMBILICAL ADULT;  Surgeon: Lattie Haw, MD;  Location: [facility] ORS;  Service: General;  Laterality: N/A;   VIDEO BRONCHOSCOPY WITH ENDOBRONCHIAL ULTRASOUND N/A 03/15/2023    Procedure: VIDEO BRONCHOSCOPY WITH ENDOBRONCHIAL ULTRASOUND;  Surgeon: Vida Rigger, MD;  Location: [facility] ORS;  Service: Thoracic;  Laterality: N/A;   Family History:  Family History  Problem Relation Age of Onset   Asthma Mother    Hypertension Father    Family Psychiatric  History: Unremarkable Social History:  Social History   Substance and Sexual Activity  Alcohol Use Not Currently   Alcohol/week: 75.0 standard drinks of alcohol   Types: 75 Cans of beer per week     Social History   Substance and Sexual Activity  Drug Use Not Currently   Types: Marijuana, "Crack" cocaine   Comment: none in 15 yrs    Social History   Socioeconomic History   Marital status: Widowed    Spouse name: Not on file   Number of children: Not on file   Years of education: Not on file   Highest education level: Not on file  Occupational History   Not on file  Tobacco Use   Smoking status: Every Day    Current packs/day: 0.15    Average packs/day: 0.2 packs/day for 45.0 years (6.8 ttl pk-yrs)    Types: Cigarettes   Smokeless tobacco: Never  Vaping Use   Vaping status: Never Used  Substance and Sexual Activity   Alcohol use: Not Currently    Alcohol/week: 75.0 standard drinks of alcohol    Types: 75 Cans of beer per week   Drug use: Not  Currently    Types: Marijuana, "Crack" cocaine    Comment: none in 15 yrs   Sexual activity: Not Currently  Other Topics Concern   Not on file  Social History Narrative   Not on file   Social Determinants of Health   Financial Resource Strain: Not on file  Food Insecurity: No Food Insecurity (06/16/2023)   Hunger Vital Sign    Worried About Running Out of Food in the Last Year: Never true    Ran Out of Food in the Last Year: Never true  Transportation Needs: No Transportation Needs (06/16/2023)   PRAPARE - Administrator, Civil Service (Medical): No    Lack of Transportation (Non-Medical): No  Physical Activity: Not on file   Stress: Not on file  Social Connections: Not on file   Additional Social History:                         Sleep: Good  Appetite:  Good  Current Medications: Current Facility-Administered Medications  Medication Dose Route Frequency Provider Last Rate Last Admin   acetaminophen (TYLENOL) tablet 650 mg  650 mg Oral Q6H PRN Penn, Cicely, NP       albuterol (VENTOLIN HFA) 108 (90 Base) MCG/ACT inhaler 1-2 puff  1-2 puff Inhalation Q6H PRN Enedina Finner, MD   1 puff at 06/27/23 1455   alum & mag hydroxide-simeth (MAALOX/MYLANTA) 200-200-20 MG/5ML suspension 30 mL  30 mL Oral Q4H PRN Penn, Cranston Neighbor, NP       aspirin EC tablet 81 mg  81 mg Oral Daily Sarina Ill, DO   81 mg at 07/02/23 0920   chlorpheniramine-HYDROcodone (TUSSIONEX) 10-8 MG/5ML suspension 5 mL  5 mL Oral Q12H PRN Mansy, Jan A, MD       clopidogrel (PLAVIX) tablet 75 mg  75 mg Oral Daily Sarina Ill, DO   75 mg at 07/02/23 0920   diltiazem (CARDIZEM CD) 24 hr capsule 240 mg  240 mg Oral Daily Sarina Ill, DO   240 mg at 07/01/23 0919   diphenhydrAMINE (BENADRYL) capsule 50 mg  50 mg Oral TID PRN Mcneil Sober, NP       Or   diphenhydrAMINE (BENADRYL) injection 50 mg  50 mg Intramuscular TID PRN Mcneil Sober, NP       famotidine (PEPCID) tablet 20 mg  20 mg Oral Daily Sarina Ill, DO   20 mg at 07/02/23 0920   FLUoxetine (PROZAC) capsule 20 mg  20 mg Oral Daily Sarina Ill, DO   20 mg at 07/02/23 0920   guaiFENesin (MUCINEX) 12 hr tablet 600 mg  600 mg Oral BID Mansy, Jan A, MD   600 mg at 07/02/23 0920   haloperidol (HALDOL) tablet 5 mg  5 mg Oral TID PRN Mcneil Sober, NP       Or   haloperidol lactate (HALDOL) injection 5 mg  5 mg Intramuscular TID PRN Mcneil Sober, NP       hydrOXYzine (ATARAX) tablet 10 mg  10 mg Oral Q6H PRN Lewanda Rife, MD   10 mg at 06/28/23 1014   ipratropium-albuterol (DUONEB) 0.5-2.5 (3) MG/3ML nebulizer solution 3 mL  3 mL  Nebulization TID Enedina Finner, MD   3 mL at 07/02/23 0936   LORazepam (ATIVAN) tablet 2 mg  2 mg Oral TID PRN Mcneil Sober, NP       Or   LORazepam (ATIVAN) injection 2 mg  2 mg  Intramuscular TID PRN Penn, Cranston Neighbor, NP       magnesium hydroxide (MILK OF MAGNESIA) suspension 30 mL  30 mL Oral Daily PRN Penn, Cranston Neighbor, NP   30 mL at 06/28/23 1059   mirtazapine (REMERON) tablet 7.5 mg  7.5 mg Oral QHS Sarina Ill, DO   7.5 mg at 07/01/23 2201   montelukast (SINGULAIR) tablet 10 mg  10 mg Oral QHS Sarina Ill, DO   10 mg at 07/01/23 2201   pravastatin (PRAVACHOL) tablet 20 mg  20 mg Oral q1800 Sarina Ill, DO   20 mg at 07/01/23 1645   QUEtiapine (SEROQUEL) tablet 600 mg  600 mg Oral QHS Sarina Ill, DO   600 mg at 07/01/23 2200   senna-docusate (Senokot-S) tablet 1 tablet  1 tablet Oral QPC breakfast Sarina Ill, DO       traZODone (DESYREL) tablet 200 mg  200 mg Oral QHS PRN Charm Rings, NP   200 mg at 07/01/23 2204   umeclidinium bromide (INCRUSE ELLIPTA) 62.5 MCG/ACT 1 puff  1 puff Inhalation Daily Sarina Ill, DO   1 puff at 07/02/23 0827    Lab Results: No results found for this or any previous visit (from the past 48 hour(s)).  Blood Alcohol level:  Lab Results  Component Value Date   ETH <10 06/13/2023   ETH <10 03/24/2023    Metabolic Disorder Labs: Lab Results  Component Value Date   HGBA1C 5.6 06/17/2023   MPG 114.02 06/17/2023   MPG 119.76 03/26/2023   No results found for: "PROLACTIN" Lab Results  Component Value Date   CHOL 172 06/17/2023   TRIG 102 06/17/2023   HDL 78 06/17/2023   CHOLHDL 2.2 06/17/2023   VLDL 20 06/17/2023   LDLCALC 74 06/17/2023   LDLCALC 51 03/26/2023    Physical Findings: AIMS:  , ,  ,  ,    CIWA:    COWS:     Musculoskeletal: Strength & Muscle Tone: within normal limits Gait & Station: normal Patient leans: N/A  Psychiatric Specialty Exam:  Presentation   General Appearance:  Appropriate for Environment  Eye Contact: Fair  Speech: Clear and Coherent  Speech Volume: Decreased  Handedness: Right   Mood and Affect  Mood: Anxious; Depressed  Affect: Constricted   Thought Process  Thought Processes: Coherent; Goal Directed; Linear  Descriptions of Associations:Intact  Orientation:Full (Time, Place and Person)  Thought Content:Logical  History of Schizophrenia/Schizoaffective disorder:Yes  Duration of Psychotic Symptoms:Greater than six months  Hallucinations:No data recorded Ideas of Reference:Paranoia; Other (comment) ("just anxious")  Suicidal Thoughts:No data recorded Homicidal Thoughts:No data recorded  Sensorium  Memory: Immediate Fair  Judgment: Intact  Insight: Present   Executive Functions  Concentration: Fair  Attention Span: Fair  Recall: [employer] of Knowledge: Fair  Language: Fair   Psychomotor Activity  Psychomotor Activity:No data recorded  Assets  Assets: Communication Skills; Desire for Improvement; Housing   Sleep  Sleep:No data recorded    Blood pressure 104/65, pulse 92, temperature 98.8 F (37.1 C), resp. rate 18, height 6\' 1"  (1.854 m), weight 100.2 kg, SpO2 (!) 89%. Body mass index is 29.16 kg/m.   Treatment Plan Summary: Daily contact with patient to assess and evaluate symptoms and progress in treatment, Medication management, and Plan start Senokot-S.  Sarina Ill, DO 07/02/2023, 12:35 PM

## 2023-07-02 NOTE — Progress Notes (Signed)
   07/02/23 0630  15 Minute Checks  Location Dayroom  Visual Appearance Calm  Behavior Sleeping  Sleep (Behavioral Health Patients Only)  Calculate sleep? (Click Yes once per 24 hr at 0600 safety check) Yes  Documented sleep last 24 hours 13.25

## 2023-07-02 NOTE — Progress Notes (Signed)
   07/02/23 1400  Psych Admission Type (Psych Patients Only)  Admission Status Voluntary  Psychosocial Assessment  Patient Complaints None  Eye Contact Fair  Facial Expression Flat  Affect Flat  Speech Logical/coherent  Interaction Minimal  Motor Activity Slow  Appearance/Hygiene In scrubs  Behavior Characteristics Cooperative  Mood Sad  Thought Process  Coherency WDL  Content WDL  Delusions None reported or observed  Perception WDL  Hallucination None reported or observed  Judgment Impaired  Confusion None  Danger to Self  Current suicidal ideation? Denies  Agreement Not to Harm Self Yes  Description of Agreement verbal  Danger to Others  Danger to Others None reported or observed  Danger to Others Abnormal  Harmful Behavior to others No threats or harm toward other people  Destructive Behavior No threats or harm toward property

## 2023-07-02 NOTE — Progress Notes (Signed)
  Signed      Pt is alert and oriented X4. Affect bright/mood congruent. Patient  denies Anxiety/depression  SI/HI/AVH, no pain at this time.    Scheduled medications administered to patient per MD orders. Support and encouragement provided . Routine safety checks conducted every 15 minutes without  incident. . Patient informed to notify staff with problems or concerns and verbalize understanding.      No adverse drug reaction noted. Pt compliant with medications  and treatment plan. Pt receptive calm, cooperative and interact well with others on the unit. Pt contracts for safety and remains safe on the unit at this time.

## 2023-07-02 NOTE — Progress Notes (Signed)
Pt is alert and oriented X4. Affect bright/mood congruent. Patient  denies Anxiety/depression  SI/HI/AVH, no pain at this time.    Scheduled medications administered to patient per MD orders. Support and encouragement provided . Routine safety checks conducted every 15 minutes without  incident. . Patient informed to notify staff with problems or concerns and verbalize understanding.  Pt reported constipation, he was given prune juice, He had a bowel movement.     No adverse drug reaction noted. Pt compliant with medications  and treatment plan. Pt receptive calm, cooperative and interact well with others on the unit. Pt contracts for safety and remains safe on the unit at this time.

## 2023-07-02 NOTE — Group Note (Signed)
Date:  07/02/2023 Time:  10:27 PM  Group Topic/Focus:  Making Healthy Choices:   The focus of this group is to help patients identify negative/unhealthy choices they were using prior to admission and identify positive/healthier coping strategies to replace them upon discharge.    Participation Level:  Minimal  Participation Quality:  Appropriate  Affect:  Appropriate  Cognitive:  Appropriate  Insight: Improving  Engagement in Group:  Improving  Modes of Intervention:  Discussion  Additional Comments:    Maeola Harman 07/02/2023, 10:27 PM

## 2023-07-03 NOTE — BHH Counselor (Signed)
CSW contacted Ms.Jackie again regarding when she can come out and assess pt to come home. CSW sent FL2 to per their request "[email or web address]"   Reynaldo Minium, MSW, [facility] 07/03/2023 4:36 PM

## 2023-07-03 NOTE — Plan of Care (Signed)
?  Problem: Education: ?Goal: Knowledge of [place] [facility] Education information/materials will improve ?Outcome: Progressing ?Goal: Emotional status will improve ?Outcome: Progressing ?Goal: Mental status will improve ?Outcome: Progressing ?Goal: Verbalization of understanding the information provided will improve ?Outcome: Progressing ?  ?Problem: Activity: ?Goal: Interest or engagement in activities will improve ?Outcome: Progressing ?Goal: Sleeping patterns will improve ?Outcome: Progressing ?  ?Problem: Coping: ?Goal: Ability to verbalize frustrations and anger appropriately will improve ?Outcome: Progressing ?Goal: Ability to demonstrate self-control will improve ?Outcome: Progressing ?  ?Problem: Health Behavior/Discharge Planning: ?Goal: Identification of resources available to assist in meeting health care needs will improve ?Outcome: Progressing ?Goal: Compliance with treatment plan for underlying cause of condition will improve ?Outcome: Progressing ?  ?Problem: Physical Regulation: ?Goal: Ability to maintain clinical measurements within normal limits will improve ?Outcome: Progressing ?  ?Problem: Safety: ?Goal: Periods of time without injury will increase ?Outcome: Progressing ?  ?Problem: Education: ?Goal: Ability to state activities that reduce stress will improve ?Outcome: Progressing ?  ?Problem: Coping: ?Goal: Ability to identify and develop effective coping behavior will improve ?Outcome: Progressing ?  ?Problem: Self-Concept: ?Goal: Ability to identify factors that promote anxiety will improve ?Outcome: Progressing ?Goal: Level of anxiety will decrease ?Outcome: Progressing ?Goal: Ability to modify response to factors that promote anxiety will improve ?Outcome: Progressing ?  ?Problem: Education: ?Goal: Utilization of techniques to improve thought processes will improve ?Outcome: Progressing ?Goal: Knowledge of the prescribed therapeutic regimen will improve ?Outcome: Progressing ?  ?Problem:  Activity: ?Goal: Interest or engagement in leisure activities will improve ?Outcome: Progressing ?Goal: Imbalance in normal sleep/wake cycle will improve ?Outcome: Progressing ?  ?Problem: Coping: ?Goal: Coping ability will improve ?Outcome: Progressing ?Goal: Will verbalize feelings ?Outcome: Progressing ?  ?Problem: Health Behavior/Discharge Planning: ?Goal: Ability to make decisions will improve ?Outcome: Progressing ?Goal: Compliance with therapeutic regimen will improve ?Outcome: Progressing ?  ?Problem: Role Relationship: ?Goal: Will demonstrate positive changes in social behaviors and relationships ?Outcome: Progressing ?  ?Problem: Safety: ?Goal: Ability to disclose and discuss suicidal ideas will improve ?Outcome: Progressing ?Goal: Ability to identify and utilize support systems that promote safety will improve ?Outcome: Progressing ?  ?Problem: Self-Concept: ?Goal: Will verbalize positive feelings about self ?Outcome: Progressing ?Goal: Level of anxiety will decrease ?Outcome: Progressing ?  ?

## 2023-07-03 NOTE — Group Note (Signed)
Recreation Therapy Group Note   Group Topic:General Recreation  Group Date: 07/03/2023 Start Time: 1400 End Time: 1500 Facilitators: Rosina Lowenstein, LRT, CTRS Location: Courtyard  Group Description: [employer]. LRT and patients played games of basketball, drew with chalk, and played corn hole while outside in the courtyard while getting fresh air and sunlight. Music was being played in the background. LRT and peers conversed about different games they have played before, what they do in their free time and anything else that is on their minds. LRT encouraged pts to drink water after being outside, sweating and getting their heart rate up.  Goal Area(s) Addressed: Patient will build on frustration tolerance skills. Patients will partake in a competitive play game with peers. Patients will gain knowledge of new leisure interest/hobby.    Affect/Mood: N/A   Participation Level: Did not attend    Clinical Observations/Individualized Feedback: [name] did not attend group.   Plan: Continue to engage patient in RT group sessions 2-3x/week.   Rosina Lowenstein, LRT, CTRS 07/03/2023 3:27 PM

## 2023-07-03 NOTE — Group Note (Signed)
Date:  07/03/2023 Time:  10:48 AM  Group Topic/Focus:  Coping With Mental Health Crisis:   The purpose of this group is to help patients identify strategies for coping with mental health crisis.  Group discusses possible causes of crisis and ways to manage them effectively.    Participation Level:  Did Not Attend   Ardelle Anton 07/03/2023, 10:48 AM

## 2023-07-03 NOTE — Progress Notes (Signed)
Patient admitted IVC to [facility] unit on November 8th for endorsing command hallucinations and poor sleep.  Patient denies SI/HI/AVH. He denies anxiety and depression. Patient reports hardened stool. Scheduled senna adm. At lunch, patient was given warm prune juice. At the time of this writing, patient says prune juice and senna were ineffective. Educated patient to increase fluid intake to aid in the process. Support provided.  Q15 minute unit checks in place.

## 2023-07-03 NOTE — Group Note (Unsigned)
Date:  07/04/2023 Time:  12:38 AM  Group Topic/Focus:  Coping With Mental Health Crisis:   The purpose of this group is to help patients identify strategies for coping with mental health crisis.  Group discusses possible causes of crisis and ways to manage them effectively.    Participation Level:  Active  Participation Quality:  Appropriate  Affect:  Appropriate  Cognitive:  Alert  Insight: Appropriate  Engagement in Group:  Engaged  Modes of Intervention:  Discussion  Additional Comments:    Maeola Harman 07/04/2023, 12:38 AM

## 2023-07-03 NOTE — [place] FL2 (Signed)
[place] MEDICAID FL2 LEVEL OF CARE FORM     IDENTIFICATION  Patient Name: [name] Birthdate: 04/14/1961 Sex: male Admission Date (Current Location): 06/16/2023  [place] and [place] Number:  [address] [id number] [facility] [place] [facility] Facility and Address:  [facility], [address], [place], [place] [address]      Provider Number: [id number]  Attending Physician Name and Address:  Sarina Ill,*  Relative Name and Phone Number:  Katy Fitch, daughter, [phone]    Current Level of Care: Hospital Recommended Level of Care: [facility] Prior Approval Number:    Date Approved/Denied:   PASRR Number:    Discharge Plan: Other (Comment) (Family Care Home)    Current Diagnoses: Patient Active Problem List   Diagnosis Date Noted   Dyslipidemia 06/20/2023   Anxiety and depression 06/20/2023   COPD (chronic obstructive pulmonary disease) (HCC) 06/13/2023   Adenocarcinoma of left lung (HCC) 12/12/2021   Undifferentiated schizophrenia (HCC)    Essential hypertension 04/13/2020   Coagulation disorder (HCC) 12/16/2019   COPD with acute exacerbation (HCC) 01/16/2017   Ventral hernia 12/18/2014    Orientation RESPIRATION BLADDER Height & Weight     Self, Time, Situation, Place  Normal Continent Weight: 221 lb (100.2 kg) Height:  6\' 1"  (185.4 cm)  BEHAVIORAL SYMPTOMS/MOOD NEUROLOGICAL BOWEL NUTRITION STATUS      Continent    AMBULATORY STATUS COMMUNICATION OF NEEDS Skin   Independent Verbally Normal                       Personal Care Assistance Level of Assistance              Functional Limitations Info             SPECIAL CARE FACTORS FREQUENCY                       Contractures Contractures Info: Not present    Additional Factors Info  Psychotropic     Psychotropic Info: QUEtiapine (SEROQUEL) tablet 600 mg         Current Medications (07/03/2023):  This is the current hospital active medication  list Current Facility-Administered Medications  Medication Dose Route Frequency Provider Last Rate Last Admin   acetaminophen (TYLENOL) tablet 650 mg  650 mg Oral Q6H PRN Penn, Cicely, NP       albuterol (VENTOLIN HFA) 108 (90 Base) MCG/ACT inhaler 1-2 puff  1-2 puff Inhalation Q6H PRN Enedina Finner, MD   1 puff at 07/02/23 2101   alum & mag hydroxide-simeth (MAALOX/MYLANTA) 200-200-20 MG/5ML suspension 30 mL  30 mL Oral Q4H PRN Penn, Cranston Neighbor, NP       aspirin EC tablet 81 mg  81 mg Oral Daily Sarina Ill, DO   81 mg at 07/02/23 0920   chlorpheniramine-HYDROcodone (TUSSIONEX) 10-8 MG/5ML suspension 5 mL  5 mL Oral Q12H PRN Mansy, Jan A, MD       clopidogrel (PLAVIX) tablet 75 mg  75 mg Oral Daily Sarina Ill, DO   75 mg at 07/02/23 0920   diltiazem (CARDIZEM CD) 24 hr capsule 240 mg  240 mg Oral Daily Sarina Ill, DO   240 mg at 07/01/23 0919   diphenhydrAMINE (BENADRYL) capsule 50 mg  50 mg Oral TID PRN Mcneil Sober, NP       Or   diphenhydrAMINE (BENADRYL) injection 50 mg  50 mg Intramuscular TID PRN Mcneil Sober, NP       famotidine (  PEPCID) tablet 20 mg  20 mg Oral Daily Sarina Ill, DO   20 mg at 07/02/23 0920   FLUoxetine (PROZAC) capsule 20 mg  20 mg Oral Daily Sarina Ill, DO   20 mg at 07/02/23 0920   guaiFENesin (MUCINEX) 12 hr tablet 600 mg  600 mg Oral BID Mansy, Jan A, MD   600 mg at 07/02/23 2117   haloperidol (HALDOL) tablet 5 mg  5 mg Oral TID PRN Mcneil Sober, NP       Or   haloperidol lactate (HALDOL) injection 5 mg  5 mg Intramuscular TID PRN Mcneil Sober, NP       hydrOXYzine (ATARAX) tablet 10 mg  10 mg Oral Q6H PRN Lewanda Rife, MD   10 mg at 06/28/23 1014   ipratropium-albuterol (DUONEB) 0.5-2.5 (3) MG/3ML nebulizer solution 3 mL  3 mL Nebulization TID Enedina Finner, MD   3 mL at 07/02/23 2101   LORazepam (ATIVAN) tablet 2 mg  2 mg Oral TID PRN Mcneil Sober, NP       Or   LORazepam (ATIVAN) injection 2 mg  2 mg  Intramuscular TID PRN Mcneil Sober, NP       magnesium hydroxide (MILK OF MAGNESIA) suspension 30 mL  30 mL Oral Daily PRN Penn, Cranston Neighbor, NP   30 mL at 06/28/23 1059   mirtazapine (REMERON) tablet 7.5 mg  7.5 mg Oral QHS Sarina Ill, DO   7.5 mg at 07/02/23 2117   montelukast (SINGULAIR) tablet 10 mg  10 mg Oral QHS Sarina Ill, DO   10 mg at 07/02/23 2116   pravastatin (PRAVACHOL) tablet 20 mg  20 mg Oral q1800 Sarina Ill, DO   20 mg at 07/02/23 1700   QUEtiapine (SEROQUEL) tablet 600 mg  600 mg Oral QHS Sarina Ill, DO   600 mg at 07/02/23 2116   senna-docusate (Senokot-S) tablet 1 tablet  1 tablet Oral QPC breakfast Sarina Ill, DO   1 tablet at 07/02/23 1348   traZODone (DESYREL) tablet 200 mg  200 mg Oral QHS PRN Charm Rings, NP   200 mg at 07/02/23 2117   umeclidinium bromide (INCRUSE ELLIPTA) 62.5 MCG/ACT 1 puff  1 puff Inhalation Daily Sarina Ill, DO   1 puff at 07/03/23 8295     Discharge Medications: Please see discharge summary for a list of discharge medications.  Relevant Imaging Results:  Relevant Lab Results:   Additional Information SS#: [id number]  Elza Rafter, [place]

## 2023-07-03 NOTE — Progress Notes (Signed)
[facility] MD Progress Note  07/03/2023 12:37 PM [name]  MRN:  [id number] Subjective: [name] is seen on rounds.  He has no complaints.  He is sleeping better and eating better.  His group home is supposed to come in and see him today.  He denies any suicidal ideation.  Should be ready for discharge tomorrow. Principal Problem: Undifferentiated schizophrenia (HCC) Diagnosis: Principal Problem:   Undifferentiated schizophrenia (HCC) Active Problems:   COPD with acute exacerbation (HCC)   Essential hypertension   Dyslipidemia   Anxiety and depression  Total Time spent with patient: 15 minutes  Past Psychiatric History: Schizophrenia Past Medical History:  Past Medical History:  Diagnosis Date   Acute on chronic respiratory failure with hypoxia (HCC)    Asthma    Coagulation disorder (HCC)    COPD (chronic obstructive pulmonary disease) (HCC)    Depression    History of hiatal hernia    Hypertension    Hypokalemia    Leukocytosis    Lung mass    Pneumonia    Schizophrenia (HCC)    Shortness of breath dyspnea    Stroke [facility])    Umbilical hernia    Ventral hernia     Past Surgical History:  Procedure Laterality Date   COLONOSCOPY WITH PROPOFOL N/A 09/21/2021   Procedure: COLONOSCOPY WITH PROPOFOL;  Surgeon: Regis Bill, MD;  Location: [facility] ENDOSCOPY;  Service: Endoscopy;  Laterality: N/A;   HERNIA REPAIR     INSERTION OF MESH N/A 12/18/2014   Procedure: INSERTION OF MESH;  Surgeon: Lattie Haw, MD;  Location: [facility] ORS;  Service: General;  Laterality: N/A;   SUPRA-UMBILICAL HERNIA  12/18/2014   Procedure: SUPRA-UMBILICAL HERNIA;  Surgeon: Lattie Haw, MD;  Location: [facility] ORS;  Service: General;;   UMBILICAL HERNIA REPAIR N/A 12/18/2014   Procedure: HERNIA REPAIR UMBILICAL ADULT;  Surgeon: Lattie Haw, MD;  Location: [facility] ORS;  Service: General;  Laterality: N/A;   VIDEO BRONCHOSCOPY WITH ENDOBRONCHIAL ULTRASOUND N/A 03/15/2023   Procedure: VIDEO BRONCHOSCOPY  WITH ENDOBRONCHIAL ULTRASOUND;  Surgeon: Vida Rigger, MD;  Location: [facility] ORS;  Service: Thoracic;  Laterality: N/A;   Family History:  Family History  Problem Relation Age of Onset   Asthma Mother    Hypertension Father    Family Psychiatric  History: Unremarkable Social History:  Social History   Substance and Sexual Activity  Alcohol Use Not Currently   Alcohol/week: 75.0 standard drinks of alcohol   Types: 75 Cans of beer per week     Social History   Substance and Sexual Activity  Drug Use Not Currently   Types: Marijuana, "Crack" cocaine   Comment: none in 15 yrs    Social History   Socioeconomic History   Marital status: Widowed    Spouse name: Not on file   Number of children: Not on file   Years of education: Not on file   Highest education level: Not on file  Occupational History   Not on file  Tobacco Use   Smoking status: Every Day    Current packs/day: 0.15    Average packs/day: 0.2 packs/day for 45.0 years (6.8 ttl pk-yrs)    Types: Cigarettes   Smokeless tobacco: Never  Vaping Use   Vaping status: Never Used  Substance and Sexual Activity   Alcohol use: Not Currently    Alcohol/week: 75.0 standard drinks of alcohol    Types: 75 Cans of beer per week   Drug use: Not Currently  Types: Marijuana, "Crack" cocaine    Comment: none in 15 yrs   Sexual activity: Not Currently  Other Topics Concern   Not on file  Social History Narrative   Not on file   Social Determinants of Health   Financial Resource Strain: Not on file  Food Insecurity: No Food Insecurity (06/16/2023)   Hunger Vital Sign    Worried About Running Out of Food in the Last Year: Never true    Ran Out of Food in the Last Year: Never true  Transportation Needs: No Transportation Needs (06/16/2023)   PRAPARE - Administrator, Civil Service (Medical): No    Lack of Transportation (Non-Medical): No  Physical Activity: Not on file  Stress: Not on file  Social  Connections: Not on file   Additional Social History:                         Sleep: Good  Appetite:  Good  Current Medications: Current Facility-Administered Medications  Medication Dose Route Frequency Provider Last Rate Last Admin   acetaminophen (TYLENOL) tablet 650 mg  650 mg Oral Q6H PRN Penn, Cicely, NP       albuterol (VENTOLIN HFA) 108 (90 Base) MCG/ACT inhaler 1-2 puff  1-2 puff Inhalation Q6H PRN Enedina Finner, MD   1 puff at 07/02/23 2101   alum & mag hydroxide-simeth (MAALOX/MYLANTA) 200-200-20 MG/5ML suspension 30 mL  30 mL Oral Q4H PRN Mcneil Sober, NP       aspirin EC tablet 81 mg  81 mg Oral Daily Sarina Ill, DO   81 mg at 07/03/23 1017   chlorpheniramine-HYDROcodone (TUSSIONEX) 10-8 MG/5ML suspension 5 mL  5 mL Oral Q12H PRN Mansy, Jan A, MD       clopidogrel (PLAVIX) tablet 75 mg  75 mg Oral Daily Sarina Ill, DO   75 mg at 07/03/23 1017   diltiazem (CARDIZEM CD) 24 hr capsule 240 mg  240 mg Oral Daily Sarina Ill, DO   240 mg at 07/03/23 1017   diphenhydrAMINE (BENADRYL) capsule 50 mg  50 mg Oral TID PRN Mcneil Sober, NP       Or   diphenhydrAMINE (BENADRYL) injection 50 mg  50 mg Intramuscular TID PRN Mcneil Sober, NP       famotidine (PEPCID) tablet 20 mg  20 mg Oral Daily Sarina Ill, DO   20 mg at 07/03/23 1017   FLUoxetine (PROZAC) capsule 20 mg  20 mg Oral Daily Sarina Ill, DO   20 mg at 07/03/23 1017   guaiFENesin (MUCINEX) 12 hr tablet 600 mg  600 mg Oral BID Mansy, Jan A, MD   600 mg at 07/03/23 1017   haloperidol (HALDOL) tablet 5 mg  5 mg Oral TID PRN Mcneil Sober, NP       Or   haloperidol lactate (HALDOL) injection 5 mg  5 mg Intramuscular TID PRN Mcneil Sober, NP       hydrOXYzine (ATARAX) tablet 10 mg  10 mg Oral Q6H PRN Lewanda Rife, MD   10 mg at 06/28/23 1014   ipratropium-albuterol (DUONEB) 0.5-2.5 (3) MG/3ML nebulizer solution 3 mL  3 mL Nebulization TID Enedina Finner, MD   3  mL at 07/03/23 1017   LORazepam (ATIVAN) tablet 2 mg  2 mg Oral TID PRN Mcneil Sober, NP       Or   LORazepam (ATIVAN) injection 2 mg  2 mg Intramuscular TID PRN Penn,  Cranston Neighbor, NP       magnesium hydroxide (MILK OF MAGNESIA) suspension 30 mL  30 mL Oral Daily PRN Penn, Cranston Neighbor, NP   30 mL at 06/28/23 1059   mirtazapine (REMERON) tablet 7.5 mg  7.5 mg Oral QHS Sarina Ill, DO   7.5 mg at 07/02/23 2117   montelukast (SINGULAIR) tablet 10 mg  10 mg Oral QHS Sarina Ill, DO   10 mg at 07/02/23 2116   pravastatin (PRAVACHOL) tablet 20 mg  20 mg Oral q1800 Sarina Ill, DO   20 mg at 07/02/23 1700   QUEtiapine (SEROQUEL) tablet 600 mg  600 mg Oral QHS Sarina Ill, DO   600 mg at 07/02/23 2116   senna-docusate (Senokot-S) tablet 1 tablet  1 tablet Oral QPC breakfast Sarina Ill, DO   1 tablet at 07/03/23 1017   traZODone (DESYREL) tablet 200 mg  200 mg Oral QHS PRN Charm Rings, NP   200 mg at 07/02/23 2117   umeclidinium bromide (INCRUSE ELLIPTA) 62.5 MCG/ACT 1 puff  1 puff Inhalation Daily Sarina Ill, DO   1 puff at 07/03/23 1610    Lab Results: No results found for this or any previous visit (from the past 48 hour(s)).  Blood Alcohol level:  Lab Results  Component Value Date   ETH <10 06/13/2023   ETH <10 03/24/2023    Metabolic Disorder Labs: Lab Results  Component Value Date   HGBA1C 5.6 06/17/2023   MPG 114.02 06/17/2023   MPG 119.76 03/26/2023   No results found for: "PROLACTIN" Lab Results  Component Value Date   CHOL 172 06/17/2023   TRIG 102 06/17/2023   HDL 78 06/17/2023   CHOLHDL 2.2 06/17/2023   VLDL 20 06/17/2023   LDLCALC 74 06/17/2023   LDLCALC 51 03/26/2023    Physical Findings: AIMS:  , ,  ,  ,    CIWA:    COWS:     Musculoskeletal: Strength & Muscle Tone: within normal limits Gait & Station: normal Patient leans: N/A  Psychiatric Specialty Exam:  Presentation  General  Appearance:  Appropriate for Environment  Eye Contact: Fair  Speech: Clear and Coherent  Speech Volume: Decreased  Handedness: Right   Mood and Affect  Mood: Anxious; Depressed  Affect: Constricted   Thought Process  Thought Processes: Coherent; Goal Directed; Linear  Descriptions of Associations:Intact  Orientation:Full (Time, Place and Person)  Thought Content:Logical  History of Schizophrenia/Schizoaffective disorder:Yes  Duration of Psychotic Symptoms:Greater than six months  Hallucinations:No data recorded Ideas of Reference:Paranoia; Other (comment) ("just anxious")  Suicidal Thoughts:No data recorded Homicidal Thoughts:No data recorded  Sensorium  Memory: Immediate Fair  Judgment: Intact  Insight: Present   Executive Functions  Concentration: Fair  Attention Span: Fair  Recall: [employer] of Knowledge: Fair  Language: Fair   Psychomotor Activity  Psychomotor Activity:No data recorded  Assets  Assets: Communication Skills; Desire for Improvement; Housing   Sleep  Sleep:No data recorded    Blood pressure 109/61, pulse 100, temperature 98.6 F (37 C), resp. rate 16, height 6\' 1"  (1.854 m), weight 100.2 kg, SpO2 (!) 87%. Body mass index is 29.16 kg/m.   Treatment Plan Summary: Daily contact with patient to assess and evaluate symptoms and progress in treatment, Medication management, and Plan continue current medications.  Sarina Ill, DO 07/03/2023, 12:37 PM

## 2023-07-03 NOTE — Plan of Care (Signed)
Problem: Activity: Goal: Interest or engagement in activities will improve Outcome: Not Progressing Goal: Sleeping patterns will improve Outcome: Progressing

## 2023-07-04 MED ORDER — QUETIAPINE FUMARATE 300 MG PO TABS: 600.0000 mg | ORAL_TABLET | Freq: Every day | ORAL | 0 refills | Status: AC

## 2023-07-04 MED ORDER — ASPIRIN 81 MG PO TBEC: 81.0000 mg | DELAYED_RELEASE_TABLET | Freq: Every day | ORAL | 12 refills | Status: DC

## 2023-07-04 MED ORDER — DILTIAZEM HCL ER COATED BEADS 240 MG PO CP24: 240.0000 mg | ORAL_CAPSULE | Freq: Every day | ORAL | 3 refills | Status: DC

## 2023-07-04 MED ORDER — ALBUTEROL SULFATE HFA 108 (90 BASE) MCG/ACT IN AERS: 1.0000 | INHALATION_SPRAY | Freq: Four times a day (QID) | RESPIRATORY_TRACT | 0 refills | Status: AC | PRN

## 2023-07-04 MED ORDER — HYDROXYZINE HCL 10 MG PO TABS: 10.0000 mg | ORAL_TABLET | Freq: Four times a day (QID) | ORAL | 0 refills | Status: DC | PRN

## 2023-07-04 MED ORDER — FLUOXETINE HCL 20 MG PO CAPS: 20.0000 mg | ORAL_CAPSULE | Freq: Every day | ORAL | 3 refills | Status: AC

## 2023-07-04 MED ORDER — IPRATROPIUM-ALBUTEROL 0.5-2.5 (3) MG/3ML IN SOLN: 3.0000 mL | Freq: Three times a day (TID) | RESPIRATORY_TRACT | 1 refills | Status: DC

## 2023-07-04 MED ORDER — MONTELUKAST SODIUM 10 MG PO TABS: 10.0000 mg | ORAL_TABLET | Freq: Every day | ORAL | 1 refills | Status: DC

## 2023-07-04 MED ORDER — MIRTAZAPINE 7.5 MG PO TABS: 7.5000 mg | ORAL_TABLET | Freq: Every day | ORAL | 0 refills | Status: DC

## 2023-07-04 MED ORDER — UMECLIDINIUM BROMIDE 62.5 MCG/ACT IN AEPB: 1.0000 | INHALATION_SPRAY | Freq: Every day | RESPIRATORY_TRACT | 1 refills | Status: DC

## 2023-07-04 MED ORDER — TRAZODONE HCL 100 MG PO TABS: 200.0000 mg | ORAL_TABLET | Freq: Every evening | ORAL | 0 refills | Status: AC | PRN

## 2023-07-04 MED ORDER — GUAIFENESIN ER 600 MG PO TB12: 600.0000 mg | ORAL_TABLET | Freq: Two times a day (BID) | ORAL | 1 refills | Status: DC

## 2023-07-04 MED ORDER — SENNOSIDES-DOCUSATE SODIUM 8.6-50 MG PO TABS: 1.0000 | ORAL_TABLET | Freq: Every day | ORAL | 0 refills | Status: AC

## 2023-07-04 MED ORDER — PRAVASTATIN SODIUM 20 MG PO TABS: 20.0000 mg | ORAL_TABLET | Freq: Every day | ORAL | 0 refills | Status: AC

## 2023-07-04 MED ORDER — CLOPIDOGREL BISULFATE 75 MG PO TABS: 75.0000 mg | ORAL_TABLET | Freq: Every day | ORAL | 0 refills | Status: AC

## 2023-07-04 MED ORDER — FAMOTIDINE 20 MG PO TABS: 20.0000 mg | ORAL_TABLET | Freq: Every day | ORAL | 0 refills | Status: AC

## 2023-07-04 NOTE — Progress Notes (Signed)
Patient discharged at this time in care of group home representative. Patient went to a family care home. All discharge instructions red and given to patient with understanding. All prescriptions called in pharmacy for pickup. Denies SI/HI/AVH at time of discharge. No SOB noted at time of discharge.

## 2023-07-04 NOTE — Discharge Summary (Signed)
Physician Discharge Summary Note  Patient:  [name] is an 62 y.o., male MRN:  [id number] DOB:  10-Sep-1960 Patient phone:  [phone] (home)  Patient address:   [address] [place] [address],    Date of Admission:  06/16/2023 Date of Discharge: 07/06/2023  Reason for Admission:  [name] is a 62 y.o. male with medical history significant of COPD Gold stage II in 2015, HTN, HLD, anxiety/depression, left upper lung mass presented with multiple complaints including persistent insomnia, cough, wheezing. Once he was stabilized medically w/ COPD exacerbation, he was transferred to [facility] for evaluation and management. He reported worsening auditory hallucinations for the past 2 days. Auditory hallucinations are command in nature and tell him to "kill myself, to cut my throat", to "hurt people in the house, jump on them".    Principal Problem: Undifferentiated schizophrenia [facility]) Discharge Diagnoses: Principal Problem:   Undifferentiated schizophrenia (HCC) Active Problems:   COPD with acute exacerbation (HCC)   Essential hypertension   Dyslipidemia   Anxiety and depression   Past Psychiatric History: Schizophrenia   Past Medical History:  Past Medical History:  Diagnosis Date   Acute on chronic respiratory failure with hypoxia (HCC)    Asthma    Coagulation disorder (HCC)    COPD (chronic obstructive pulmonary disease) (HCC)    Depression    History of hiatal hernia    Hypertension    Hypokalemia    Leukocytosis    Lung mass    Pneumonia    Schizophrenia (HCC)    Shortness of breath dyspnea    Stroke [facility])    Umbilical hernia    Ventral hernia     Past Surgical History:  Procedure Laterality Date   COLONOSCOPY WITH PROPOFOL N/A 09/21/2021   Procedure: COLONOSCOPY WITH PROPOFOL;  Surgeon: Regis Bill, MD;  Location: [facility] ENDOSCOPY;  Service: Endoscopy;  Laterality: N/A;   HERNIA REPAIR     INSERTION OF MESH N/A 12/18/2014    Procedure: INSERTION OF MESH;  Surgeon: Lattie Haw, MD;  Location: [facility] ORS;  Service: General;  Laterality: N/A;   SUPRA-UMBILICAL HERNIA  12/18/2014   Procedure: SUPRA-UMBILICAL HERNIA;  Surgeon: Lattie Haw, MD;  Location: [facility] ORS;  Service: General;;   UMBILICAL HERNIA REPAIR N/A 12/18/2014   Procedure: HERNIA REPAIR UMBILICAL ADULT;  Surgeon: Lattie Haw, MD;  Location: [facility] ORS;  Service: General;  Laterality: N/A;   VIDEO BRONCHOSCOPY WITH ENDOBRONCHIAL ULTRASOUND N/A 03/15/2023   Procedure: VIDEO BRONCHOSCOPY WITH ENDOBRONCHIAL ULTRASOUND;  Surgeon: Vida Rigger, MD;  Location: [facility] ORS;  Service: Thoracic;  Laterality: N/A;   Family History:  Family History  Problem Relation Age of Onset   Asthma Mother    Hypertension Father     Social History:  Social History   Substance and Sexual Activity  Alcohol Use Not Currently   Alcohol/week: 75.0 standard drinks of alcohol   Types: 75 Cans of beer per week     Social History   Substance and Sexual Activity  Drug Use Not Currently   Types: Marijuana, "Crack" cocaine   Comment: none in 15 yrs    Social History   Socioeconomic History   Marital status: Widowed    Spouse name: Not on file   Number of children: Not on file   Years of education: Not on file   Highest education level: Not on file  Occupational History   Not on file  Tobacco Use   Smoking status: Every Day  Current packs/day: 0.15    Average packs/day: 0.2 packs/day for 45.0 years (6.8 ttl pk-yrs)    Types: Cigarettes   Smokeless tobacco: Never  Vaping Use   Vaping status: Never Used  Substance and Sexual Activity   Alcohol use: Not Currently    Alcohol/week: 75.0 standard drinks of alcohol    Types: 75 Cans of beer per week   Drug use: Not Currently    Types: Marijuana, "Crack" cocaine    Comment: none in 15 yrs   Sexual activity: Not Currently  Other Topics Concern   Not on file  Social History Narrative   Not on file    Social Determinants of Health   Financial Resource Strain: Not on file  Food Insecurity: No Food Insecurity (06/16/2023)   Hunger Vital Sign    Worried About Running Out of Food in the Last Year: Never true    Ran Out of Food in the Last Year: Never true  Transportation Needs: No Transportation Needs (06/16/2023)   PRAPARE - Administrator, Civil Service (Medical): No    Lack of Transportation (Non-Medical): No  Physical Activity: Not on file  Stress: Not on file  Social Connections: Not on file    Hospital Course:  The patient was admitted to Inpatient psychiatric treatment for stabilization of Depression, SI, psychosis and paranoid behaviors. Patient was placed on suicidal precautions. The patient was evaluated and treated by the multidisciplinary treatment team including physicians, nurses, social workers and therapists. All medications were presented to the patient and the Patient gave consent to all the medications that they were given, as well as was explained the risks, benefits, side effects and alternatives of all medication therapies. The patient was integrated into the general milieu on the ward and encouraged to attend to his ADLs and participate in all groups and activities. During hospital course the Patient attended coping skill groups, music therapy and activity therapy groups. Patient was counseled on cognitive techniques/skills by multiple staff members and given support care by the staff.   Patient's medication regimen was evaluated and titrated to therapeutic levels to better Patient's overall daily functioning.  Patient was started on Seroquel, Prozac.  Hospitalist was consulted to manage COPD and shortness of breath.  Dose of Seroquel was gradually increased to 600 mg at bedtime.  Patient tolerated the medication well with no significant side effects.  During the hospitalization, the patient demonstrated a stabilization of mood with decreased racing thoughts,  decreased impulsivity, improved sleep and decreased irritability. At the time of discharge, the patient denied any suicidal ideation/homicidal ideation and was not overtly depressed, manic or psychotic. The Patient was interacting well in groups and on the unit with their peers. Patient was able to identify a safety plan to include speaking with family, contacting outpatient provider or calling 911 if hallucinations/delusions returned or worsened or thoughts of self-harm or suicide return. Patient was counselled on outpatient follow-up that was arranged prior to discharge.  Musculoskeletal: Strength & Muscle Tone: within normal limits Gait & Station: normal Patient leans: N/A   Psychiatric Specialty Exam:   Presentation  General Appearance:  Appropriate for Environment   Eye Contact: Fair   Speech: Clear and Coherent   Speech Volume: Soft   Handedness: Right     Mood and Affect  Mood: Fine   Affect: Stable     Thought Process  Thought Processes: Coherent; Goal Directed; Linear   Descriptions of Associations:Intact   Orientation:Full (Time, Place and Person)  Thought Content:Logical   History of Schizophrenia/Schizoaffective disorder:Yes   Duration of Psychotic Symptoms:Greater than six months   Hallucinations:Denies Ideas of Reference:No   Suicidal Thoughts: Denies suicidal thoughts   Homicidal Thoughts:Denies   Sensorium  Memory: Immediate Fair   Judgment: Intact   Insight: Present     Executive Functions  Concentration: Fair   Attention Span: Fair   Fund of Knowledge: Fair   Language: Fair     Psychomotor Activity  Psychomotor Activity:Normal   Assets  Assets: Manufacturing systems engineer; Desire for Improvement; Housing     Sleep  Sleep:Fair     Physical Exam: Physical Exam Constitutional:      Appearance: Normal appearance.  HENT:     Head: Normocephalic and atraumatic.     Nose: No congestion.  Cardiovascular:      Pulses: Normal pulses.  Skin:    General: Skin is warm.  Neurological:     Mental Status: He is alert and oriented to person, place, and time.      Review of Systems  Constitutional:  Negative for chills and fever.  HENT:  Negative for congestion and hearing loss.   Eyes:  Negative for blurred vision.  Respiratory: Negative Cardiovascular:  Negative for chest pain and palpitations.  Gastrointestinal:  Negative for nausea and vomiting.  Neurological:  Negative for dizziness and speech change.   Blood pressure 109/67, pulse 97, temperature 98.8 F (37.1 C), resp. rate 16, height 6\' 1"  (1.854 m), weight 100.2 kg, SpO2 92%. Body mass index is 29.16 kg/m.   Social History   Tobacco Use  Smoking Status Every Day   Current packs/day: 0.15   Average packs/day: 0.2 packs/day for 45.0 years (6.8 ttl pk-yrs)   Types: Cigarettes  Smokeless Tobacco Never   Tobacco Cessation:  N/A, patient does not currently use tobacco products   Blood Alcohol level:  Lab Results  Component Value Date   ETH <10 06/13/2023   ETH <10 03/24/2023    Metabolic Disorder Labs:  Lab Results  Component Value Date   HGBA1C 5.6 06/17/2023   MPG 114.02 06/17/2023   MPG 119.76 03/26/2023   No results found for: "PROLACTIN" Lab Results  Component Value Date   CHOL 172 06/17/2023   TRIG 102 06/17/2023   HDL 78 06/17/2023   CHOLHDL 2.2 06/17/2023   VLDL 20 06/17/2023   LDLCALC 74 06/17/2023   LDLCALC 51 03/26/2023    See Psychiatric Specialty Exam and Suicide Risk Assessment completed by Attending Physician prior to discharge.  Discharge destination:  Home  Is patient on multiple antipsychotic therapies at discharge:  No     Recommended Plan for Multiple Antipsychotic Therapies: NA   Allergies as of 07/04/2023   No Known Allergies      Medication List     STOP taking these medications    dextromethorphan-guaiFENesin 10-100 MG/5ML liquid Commonly known as: ROBITUSSIN-DM   docusate  sodium 100 MG capsule Commonly known as: COLACE   doxycycline 100 MG tablet Commonly known as: VIBRA-TABS   lovastatin 20 MG tablet Commonly known as: MEVACOR   paliperidone 9 MG 24 hr tablet Commonly known as: INVEGA   predniSONE 20 MG tablet Commonly known as: DELTASONE   prochlorperazine 10 MG tablet Commonly known as: COMPAZINE       TAKE these medications      Indication  albuterol 108 (90 Base) MCG/ACT inhaler Commonly known as: VENTOLIN HFA Inhale 1-2 puffs into the lungs every 6 (six) hours as needed for wheezing or  shortness of breath. What changed:  how much to take when to take this    aspirin EC 81 MG tablet Take 1 tablet (81 mg total) by mouth daily. Swallow whole. Start taking on: July 05, 2023    clopidogrel 75 MG tablet Commonly known as: PLAVIX Take 1 tablet (75 mg total) by mouth daily. Start taking on: July 05, 2023    diltiazem 240 MG 24 hr capsule Commonly known as: CARDIZEM CD Take 1 capsule (240 mg total) by mouth daily.  Indication: High Blood Pressure   famotidine 20 MG tablet Commonly known as: PEPCID Take 1 tablet (20 mg total) by mouth daily.    FLUoxetine 20 MG capsule Commonly known as: PROZAC Take 1 capsule (20 mg total) by mouth daily. Start taking on: July 05, 2023    guaiFENesin 600 MG 12 hr tablet Commonly known as: MUCINEX Take 1 tablet (600 mg total) by mouth 2 (two) times daily. What changed:  when to take this reasons to take this    hydrOXYzine 10 MG tablet Commonly known as: ATARAX Take 1 tablet (10 mg total) by mouth every 6 (six) hours as needed for anxiety.    ipratropium-albuterol 0.5-2.5 (3) MG/3ML Soln Commonly known as: DUONEB Take 3 mLs by nebulization 3 (three) times daily.    mirtazapine 7.5 MG tablet Commonly known as: REMERON Take 1 tablet (7.5 mg total) by mouth at bedtime. What changed:  medication strength how much to take    montelukast 10 MG tablet Commonly known as:  SINGULAIR Take 1 tablet (10 mg total) by mouth at bedtime.    pravastatin 20 MG tablet Commonly known as: PRAVACHOL Take 1 tablet (20 mg total) by mouth daily at 6 PM.    QUEtiapine 300 MG tablet Commonly known as: SEROQUEL Take 2 tablets (600 mg total) by mouth at bedtime.    senna-docusate 8.6-50 MG tablet Commonly known as: Senokot-S Take 1 tablet by mouth daily after breakfast. Start taking on: July 05, 2023    traZODone 100 MG tablet Commonly known as: DESYREL Take 2 tablets (200 mg total) by mouth at bedtime as needed for sleep. What changed:  medication strength how much to take    Trelegy Ellipta 100-62.5-25 MCG/ACT Aepb Generic drug: Fluticasone-Umeclidin-Vilant Inhale 1 puff into the lungs daily.  Indication: Chronic Obstructive Lung Disease   umeclidinium bromide 62.5 MCG/ACT Aepb Commonly known as: INCRUSE ELLIPTA Inhale 1 puff into the lungs daily. Start taking on: July 05, 2023         Follow-up Information     [facility] Follow up today.   Why: [facility] will pick you up from the hospital today between 1pm- 2pm. Contact information: [address] [place], [place] [address]  Franchot Erichsen, [facility] Manager , [phone]               PATIENTS CONDITION AT DISCHARGE:  Stable  TOBACCO CESSATION SCREENING  Patient was screened and counselled on smoking cessation at time of discharge. discharge medication list.   PRESCRIPTION ARE LOCATED:  On Chart   DISCHARGE INSTRUCTIONS:  1. Diet: Cardiac healthy  2. Activity: As tolerated 3. Take medications as prescribed and not to make any changes without first consulting with the outpatient provider.  4. Patient was advised to avoid any illicit drugs or alcohol due to negative impact on physical and mental health.  5. Patient should keep all follow up appointments.   TIME SPENT ON DISCHARGE: Over 35 minutes  were spent on this patients discharge  including a face to face encounter, patient counseling and preparation of discharge materials.   Signed: Lewanda Rife, MD 07/04/2023, 1:19 PM

## 2023-07-04 NOTE — Progress Notes (Signed)
  [facility] Adult Case Management Discharge Plan :  Will you be returning to the same living situation after discharge:  Yes,  pt will return to [facility]  At discharge, do you have transportation home?: Yes,  pt will be picked up by adult family care home  Do you have the ability to pay for your medications: Yes,  [employer] MEDICARE / Suan Halter DUAL COMPLETE  Release of information consent forms completed and in the chart;  Patient's signature needed at discharge.  Patient to Follow up at:  Follow-up Information     [facility] Follow up today.   Why: [facility] will pick you up from the hospital today between 1pm- 2pm. Contact information: [address] [place], [place] [address]  Franchot Erichsen, [facility] Manager , [phone]                Next level of care provider has access to [facility]:no  Safety Planning and Suicide Prevention discussed: Yes,  Franchot Erichsen, [facility] Manager, [phone]     Has patient been referred to the Quitline?: Patient refused referral for treatment  Patient has been referred for addiction treatment: No known substance use disorder.  [address] LCSWA 07/04/2023, 9:58 AM

## 2023-07-04 NOTE — Group Note (Signed)
Date:  07/04/2023 Time:  10:47 AM  Group Topic/Focus:  Coping With Mental Health Crisis:   The purpose of this group is to help patients identify strategies for coping with mental health crisis.  Group discusses possible causes of crisis and ways to manage them effectively. Goals Group:   The focus of this group is to help patients establish daily goals to achieve during treatment and discuss how the patient can incorporate goal setting into their daily lives to aide in recovery. Healthy Communication:   The focus of this group is to discuss communication, barriers to communication, as well as healthy ways to communicate with others. Personal Development- We focused on ways to better cope with stressors and challenges.    Participation Level:  Did Not Attend  Participation Quality:    Affect:    Cognitive:    Insight:  Engagement in Group:    Modes of Intervention:    Additional Comments:    Kynnedy Carreno L Lusero Nordlund 07/04/2023, 10:47 AM

## 2023-07-04 NOTE — BHH Suicide Risk Assessment (Signed)
[facility] Discharge Suicide Risk Assessment   Principal Problem: Undifferentiated schizophrenia [facility]) Discharge Diagnoses: Principal Problem:   Undifferentiated schizophrenia (HCC) Active Problems:   COPD with acute exacerbation (HCC)   Essential hypertension   Dyslipidemia   Anxiety and depression     Musculoskeletal: Strength & Muscle Tone: within normal limits Gait & Station: normal Patient leans: N/A   Psychiatric Specialty Exam:   Presentation  General Appearance:  Appropriate for Environment   Eye Contact: Fair   Speech: Clear and Coherent   Speech Volume: Soft   Handedness: Right     Mood and Affect  Mood: Fine   Affect: Stable     Thought Process  Thought Processes: Coherent; Goal Directed; Linear   Descriptions of Associations:Intact   Orientation:Full (Time, Place and Person)   Thought Content:Logical   History of Schizophrenia/Schizoaffective disorder:Yes   Duration of Psychotic Symptoms:Greater than six months   Hallucinations:Denies Ideas of Reference:No   Suicidal Thoughts: Denies suicidal thoughts   Homicidal Thoughts:Denies   Sensorium  Memory: Immediate Fair   Judgment: Intact   Insight: Present     Executive Functions  Concentration: Fair   Attention Span: Fair   Fund of Knowledge: Fair   Language: Fair     Psychomotor Activity  Psychomotor Activity:Normal   Assets  Assets: Manufacturing systems engineer; Desire for Improvement; Housing     Sleep  Sleep:Fair     Physical Exam: Physical Exam Constitutional:      Appearance: Normal appearance.  HENT:     Head: Normocephalic and atraumatic.     Nose: No congestion.  Cardiovascular:     Pulses: Normal pulses.  Skin:    General: Skin is warm.  Neurological:     Mental Status: He is alert and oriented to person, place, and time.      Review of Systems  Constitutional:  Negative for chills and fever.  HENT:  Negative for congestion and hearing loss.    Eyes:  Negative for blurred vision.  Respiratory: Negative Cardiovascular:  Negative for chest pain and palpitations.  Gastrointestinal:  Negative for nausea and vomiting.  Neurological:  Negative for dizziness and speech change.   Blood pressure 109/67, pulse 97, temperature 98.8 F (37.1 C), resp. rate 16, height 6\' 1"  (1.854 m), weight 100.2 kg, SpO2 92%. Body mass index is 29.16 kg/m.    Demographic Factors:  Male  Loss Factors: Decrease in vocational status  Historical Factors: Impulsivity  Risk Reduction Factors:   Positive therapeutic relationship and Positive coping skills or problem solving skills  Continued Clinical Symptoms:  Previous Psychiatric Diagnoses and Treatments  Cognitive Features That Contribute To Risk:  Thought constriction (tunnel vision)    Suicide Risk:  Minimal:    Follow-up Information     [facility] Follow up today.   Why: [facility] will pick you up from the hospital today between 1pm- 2pm. Contact information: [address] [place], [place] [address]  Franchot Erichsen, [facility] Manager , [phone]                Plan Of Care/Follow-up recommendations:  Per Discharge Summary  Lewanda Rife, MD 07/04/2023, 1:16 PM

## 2023-07-04 NOTE — Care Management Important Message (Signed)
Important Message  Patient Details  Name: [name] MRN: [id number] Date of Birth: 03-Jan-1961   Important Message Given:  Yes - Medicare IM     Elza Rafter, LCSWA 07/04/2023, 10:01 AM

## 2023-07-04 NOTE — Progress Notes (Signed)
Patient is alert & oriented x 4.  He is able to voice complaints appropriately.  He denied pain / discomfort.    He denied SI, HI, delusions, and hallucinations.  He took all medications without difficulty.  He was given his neb.tx as ordered.  Respirations even / unlabored - he denied acute distress.    He rested in bed after socializing in the dayroom.  No distress noted.  Q63m safety checks in place as per protocol.

## 2023-07-04 NOTE — BH IP Treatment Plan (Signed)
Interdisciplinary Treatment and Diagnostic Plan Update  07/04/2023 Time of Session: 9:30 AM  [name] MRN: [id number]  Principal Diagnosis: Undifferentiated schizophrenia [facility])  Secondary Diagnoses: Principal Problem:   Undifferentiated schizophrenia (HCC) Active Problems:   COPD with acute exacerbation (HCC)   Essential hypertension   Dyslipidemia   Anxiety and depression   Current Medications:  Current Facility-Administered Medications  Medication Dose Route Frequency Provider Last Rate Last Admin   acetaminophen (TYLENOL) tablet 650 mg  650 mg Oral Q6H PRN Penn, Cicely, NP       albuterol (VENTOLIN HFA) 108 (90 Base) MCG/ACT inhaler 1-2 puff  1-2 puff Inhalation Q6H PRN Enedina Finner, MD   1 puff at 07/02/23 2101   alum & mag hydroxide-simeth (MAALOX/MYLANTA) 200-200-20 MG/5ML suspension 30 mL  30 mL Oral Q4H PRN Mcneil Sober, NP       aspirin EC tablet 81 mg  81 mg Oral Daily Sarina Ill, DO   81 mg at 07/04/23 0856   chlorpheniramine-HYDROcodone (TUSSIONEX) 10-8 MG/5ML suspension 5 mL  5 mL Oral Q12H PRN Mansy, Jan A, MD       clopidogrel (PLAVIX) tablet 75 mg  75 mg Oral Daily Sarina Ill, DO   75 mg at 07/04/23 0856   diltiazem (CARDIZEM CD) 24 hr capsule 240 mg  240 mg Oral Daily Sarina Ill, DO   240 mg at 07/04/23 0900   diphenhydrAMINE (BENADRYL) capsule 50 mg  50 mg Oral TID PRN Mcneil Sober, NP       Or   diphenhydrAMINE (BENADRYL) injection 50 mg  50 mg Intramuscular TID PRN Mcneil Sober, NP       famotidine (PEPCID) tablet 20 mg  20 mg Oral Daily Sarina Ill, DO   20 mg at 07/04/23 0856   FLUoxetine (PROZAC) capsule 20 mg  20 mg Oral Daily Sarina Ill, DO   20 mg at 07/04/23 0856   guaiFENesin (MUCINEX) 12 hr tablet 600 mg  600 mg Oral BID Mansy, Jan A, MD   600 mg at 07/04/23 4098   haloperidol (HALDOL) tablet 5 mg  5 mg Oral TID PRN Mcneil Sober, NP       Or   haloperidol lactate (HALDOL) injection 5 mg   5 mg Intramuscular TID PRN Mcneil Sober, NP       hydrOXYzine (ATARAX) tablet 10 mg  10 mg Oral Q6H PRN Lewanda Rife, MD   10 mg at 07/04/23 0856   ipratropium-albuterol (DUONEB) 0.5-2.5 (3) MG/3ML nebulizer solution 3 mL  3 mL Nebulization TID Enedina Finner, MD   3 mL at 07/04/23 0858   LORazepam (ATIVAN) tablet 2 mg  2 mg Oral TID PRN Mcneil Sober, NP       Or   LORazepam (ATIVAN) injection 2 mg  2 mg Intramuscular TID PRN Mcneil Sober, NP       magnesium hydroxide (MILK OF MAGNESIA) suspension 30 mL  30 mL Oral Daily PRN Penn, Cranston Neighbor, NP   30 mL at 06/28/23 1059   mirtazapine (REMERON) tablet 7.5 mg  7.5 mg Oral QHS Sarina Ill, DO   7.5 mg at 07/03/23 2158   montelukast (SINGULAIR) tablet 10 mg  10 mg Oral QHS Sarina Ill, DO   10 mg at 07/03/23 2158   pravastatin (PRAVACHOL) tablet 20 mg  20 mg Oral q1800 Sarina Ill, DO   20 mg at 07/03/23 1640   QUEtiapine (SEROQUEL) tablet 600 mg  600 mg Oral QHS  Sarina Ill, DO   600 mg at 07/03/23 2159   senna-docusate (Senokot-S) tablet 1 tablet  1 tablet Oral QPC breakfast Sarina Ill, DO   1 tablet at 07/04/23 1914   traZODone (DESYREL) tablet 200 mg  200 mg Oral QHS PRN Charm Rings, NP   200 mg at 07/03/23 2205   umeclidinium bromide (INCRUSE ELLIPTA) 62.5 MCG/ACT 1 puff  1 puff Inhalation Daily Sarina Ill, DO   1 puff at 07/04/23 0900   PTA Medications: Medications Prior to Admission  Medication Sig Dispense Refill Last Dose   albuterol (VENTOLIN HFA) 108 (90 Base) MCG/ACT inhaler Inhale 2 puffs into the lungs every 4 (four) hours as needed for wheezing or shortness of breath.      aspirin EC 81 MG tablet Take 81 mg by mouth daily. Swallow whole.      clopidogrel (PLAVIX) 75 MG tablet Take 1 tablet (75 mg total) by mouth daily. 30 tablet 3    dextromethorphan-guaiFENesin (ROBITUSSIN-DM) 10-100 MG/5ML liquid Take 10 mLs by mouth every 6 (six) hours as needed for cough.       diltiazem (CARDIZEM CD) 240 MG 24 hr capsule Take 1 capsule (240 mg total) by mouth daily. 30 capsule 3    docusate sodium (COLACE) 100 MG capsule Take 100 mg by mouth 2 (two) times daily as needed for mild constipation.      [EXPIRED] doxycycline (VIBRA-TABS) 100 MG tablet Take 1 tablet (100 mg total) by mouth every 12 (twelve) hours for 3 days.      famotidine (PEPCID) 20 MG tablet Take 1 tablet (20 mg total) by mouth daily. 30 tablet 0    Fluticasone-Umeclidin-Vilant (TRELEGY ELLIPTA) 100-62.5-25 MCG/ACT AEPB Inhale 1 puff into the lungs daily. 28 each 1    guaiFENesin (MUCINEX) 600 MG 12 hr tablet Take 600 mg by mouth 2 (two) times daily as needed.      ipratropium-albuterol (DUONEB) 0.5-2.5 (3) MG/3ML SOLN Take 3 mLs by nebulization 3 (three) times daily. 270 mL 0    lovastatin (MEVACOR) 20 MG tablet Take 20 mg by mouth at bedtime.      mirtazapine (REMERON) 30 MG tablet Take 1 tablet (30 mg total) by mouth at bedtime. 30 tablet 3    montelukast (SINGULAIR) 10 MG tablet Take 1 tablet (10 mg total) by mouth at bedtime. 30 tablet 1    paliperidone (INVEGA) 9 MG 24 hr tablet Take 1 tablet (9 mg total) by mouth at bedtime. 30 tablet 3    [EXPIRED] predniSONE (DELTASONE) 20 MG tablet Take 2 tablets (40 mg total) by mouth daily with breakfast for 3 days.      prochlorperazine (COMPAZINE) 10 MG tablet Take 1 tablet (10 mg total) by mouth every 6 (six) hours as needed for nausea or vomiting. 90 tablet 0    traZODone (DESYREL) 150 MG tablet Take 1 tablet (150 mg total) by mouth at bedtime as needed for sleep. 30 tablet 3     Patient Stressors: Health problems   Other: AVH experiences    Patient Strengths: Communication skills  Supportive family/friends   Treatment Modalities: Medication Management, Group therapy, Case management,  1 to 1 session with clinician, Psychoeducation, Recreational therapy.   Physician Treatment Plan for Primary Diagnosis: Undifferentiated schizophrenia  (HCC) Long Term Goal(s): Improvement in symptoms so as ready for discharge   Short Term Goals: Ability to identify changes in lifestyle to reduce recurrence of condition will improve Ability to verbalize feelings will improve  Ability to disclose and discuss suicidal ideas Ability to demonstrate self-control will improve Ability to identify and develop effective coping behaviors will improve Ability to maintain clinical measurements within normal limits will improve Compliance with prescribed medications will improve Ability to identify triggers associated with substance abuse/mental health issues will improve  Medication Management: Evaluate patient's response, side effects, and tolerance of medication regimen.  Therapeutic Interventions: 1 to 1 sessions, Unit Group sessions and Medication administration.  Evaluation of Outcomes: Adequate for Discharge  Physician Treatment Plan for Secondary Diagnosis: Principal Problem:   Undifferentiated schizophrenia (HCC) Active Problems:   COPD with acute exacerbation (HCC)   Essential hypertension   Dyslipidemia   Anxiety and depression  Long Term Goal(s): Improvement in symptoms so as ready for discharge   Short Term Goals: Ability to identify changes in lifestyle to reduce recurrence of condition will improve Ability to verbalize feelings will improve Ability to disclose and discuss suicidal ideas Ability to demonstrate self-control will improve Ability to identify and develop effective coping behaviors will improve Ability to maintain clinical measurements within normal limits will improve Compliance with prescribed medications will improve Ability to identify triggers associated with substance abuse/mental health issues will improve     Medication Management: Evaluate patient's response, side effects, and tolerance of medication regimen.  Therapeutic Interventions: 1 to 1 sessions, Unit Group sessions and Medication  administration.  Evaluation of Outcomes: Adequate for Discharge   RN Treatment Plan for Primary Diagnosis: Undifferentiated schizophrenia (HCC) Long Term Goal(s): Knowledge of disease and therapeutic regimen to maintain health will improve  Short Term Goals: Ability to remain free from injury will improve, Ability to verbalize frustration and anger appropriately will improve, Ability to demonstrate self-control, Ability to participate in decision making will improve, Ability to verbalize feelings will improve, Ability to disclose and discuss suicidal ideas, Ability to identify and develop effective coping behaviors will improve, and Compliance with prescribed medications will improve  Medication Management: RN will administer medications as ordered by provider, will assess and evaluate patient's response and provide education to patient for prescribed medication. RN will report any adverse and/or side effects to prescribing provider.  Therapeutic Interventions: 1 on 1 counseling sessions, Psychoeducation, Medication administration, Evaluate responses to treatment, Monitor vital signs and CBGs as ordered, Perform/monitor CIWA, COWS, AIMS and Fall Risk screenings as ordered, Perform wound care treatments as ordered.  Evaluation of Outcomes: Adequate for Discharge   LCSW Treatment Plan for Primary Diagnosis: Undifferentiated schizophrenia (HCC) Long Term Goal(s): Safe transition to appropriate next level of care at discharge, Engage patient in therapeutic group addressing interpersonal concerns.  Short Term Goals: Engage patient in aftercare planning with referrals and resources, Increase social support, Increase ability to appropriately verbalize feelings, Increase emotional regulation, Facilitate acceptance of mental health diagnosis and concerns, Facilitate patient progression through stages of change regarding substance use diagnoses and concerns, Identify triggers associated with mental  health/substance abuse issues, and Increase skills for wellness and recovery  Therapeutic Interventions: Assess for all discharge needs, 1 to 1 time with Social worker, Explore available resources and support systems, Assess for adequacy in community support network, Educate family and significant other(s) on suicide prevention, Complete Psychosocial Assessment, Interpersonal group therapy.  Evaluation of Outcomes: Adequate for Discharge   Progress in Treatment: Attending groups: Yes. and No. Participating in groups: Yes. and No. Taking medication as prescribed: Yes. Toleration medication: Yes. Family/Significant other contact made: Yes, individual(s) contacted:  Franchot Erichsen, [facility] Manager, [phone] Patient understands diagnosis: Yes. Discussing patient  identified problems/goals with staff: Yes. Medical problems stabilized or resolved: Yes. Denies suicidal/homicidal ideation: Yes. Issues/concerns per patient self-inventory: No. Other: None   New problem(s) identified: No, Describe: None Update 06/24/23: No changes at this time. Update 06/29/23: No changes at this time Update 07/04/23: No changes at this time    New Short Term/Long Term Goal(s):  elimination of symptoms of psychosis, medication management for mood stabilization; elimination of SI thoughts; development of comprehensive mental wellness plan. Update 06/24/23: No changes at this time. Update 06/29/23: No changes at this time Update 07/04/23: No changes at this time      Patient Goals:  "Get sleep"  Update 06/24/23: No changes at this time. Update 06/29/23: No changes at this time Update 07/04/23: No changes at this time      Discharge Plan or Barriers: CSW to assist with discharge planning Update 06/24/23: No changes at this time. Update 06/29/23: No changes at this time Update 07/04/23: No changes at this time      Reason for Continuation of Hospitalization: Medication stabilization   Estimated Length of  Stay: 1 to 7 days  Update 06/24/23: No changes at this time. Update 06/29/23: TBD Update 07/04/23: 07/04/23  Last 3 [place] Suicide Severity Risk Score: Flowsheet Row Admission (Current) from 06/16/2023 in [facility] BE[facility]IORAL [facility]ICINE ED to Hosp-Admission (Discharged) from 06/13/2023 in [facility] GENERAL SURGERY ED to Hosp-Admission (Discharged) from 05/25/2023 in [facility]AL CARDIAC MED PCU  C-SSRS RISK CATEGORY Low Risk No Risk No Risk       Last PHQ 2/9 Scores:    02/03/2023   11:49 AM  Depression screen PHQ 2/9  Decreased Interest 0  Down, Depressed, Hopeless 0  PHQ - 2 Score 0    Scribe for Treatment Team: Laretta Alstrom 07/04/2023 9:37 AM

## 2023-07-04 NOTE — Group Note (Signed)
[facility] LCSW Group Therapy Note   Group Date: 07/04/2023 Start Time: 1300 End Time: 1400  Type of Therapy/Topic:  Group Therapy:  Feelings about Diagnosis  Participation Level:  Did Not Attend   Mood: X   Description of Group:    This group will allow patients to explore their thoughts and feelings about diagnoses they have received. Patients will be guided to explore their level of understanding and acceptance of these diagnoses. Facilitator will encourage patients to process their thoughts and feelings about the reactions of others to their diagnosis, and will guide patients in identifying ways to discuss their diagnosis with significant others in their lives. This group will be process-oriented, with patients participating in exploration of their own experiences as well as giving and receiving support and challenge from other group members.   Therapeutic Goals: 1. Patient will demonstrate understanding of diagnosis as evidence by identifying two or more symptoms of the disorder:  2. Patient will be able to express two feelings regarding the diagnosis 3. Patient will demonstrate ability to communicate their needs through discussion and/or role plays  Summary of Patient Progress:    X    Therapeutic Modalities:   Cognitive Behavioral Therapy Brief Therapy Feelings Identification    [name], LCSWA

## 2023-07-12 ENCOUNTER — Ambulatory Visit
Admission: RE | Admit: 2023-07-12 | Discharge: 2023-07-12 | Disposition: A | Discharge status: Home / Self Care | Payer: 59 | Source: Ambulatory Visit | Attending: Oncology | Admitting: Oncology

## 2023-07-12 ENCOUNTER — Other Ambulatory Visit: Payer: Self-pay | Admitting: Oncology

## 2023-07-12 ENCOUNTER — Telehealth: Payer: Self-pay

## 2023-07-12 DIAGNOSIS — C3492 Malignant neoplasm of unspecified part of left bronchus or lung: Secondary | ICD-10-CM | POA: Insufficient documentation

## 2023-07-12 MED ORDER — IOHEXOL 300 MG/ML  SOLN
75.0000 mL | Freq: Once | INTRAMUSCULAR | Status: AC | PRN
  Administered 2023-07-12: 75 mL via INTRAVENOUS

## 2023-07-12 MED ORDER — APIXABAN (ELIQUIS) VTE STARTER PACK (10MG AND 5MG): ORAL_TABLET | ORAL | 0 refills | Status: DC

## 2023-07-12 NOTE — Telephone Encounter (Signed)
Per Dr. Cathie Hoops: please inform patient that he has got PE, I recommend to start anticoagulation. with Eliquis 10mg  BID x 7 days followed by 5mg  BID. discontinue Aspirin  [facility] and spoke to [place]. She reqested script be sent to [facility] and for fax to be sent to [facility] pharmacy to dc aspirin.   Please schedule patient for MD tomorrow 12/5 @9 :15 (MD only), caregiver aware of appt.

## 2023-07-13 ENCOUNTER — Inpatient Hospital Stay: Payer: 59

## 2023-07-13 ENCOUNTER — Inpatient Hospital Stay: Payer: 59 | Attending: Oncology | Admitting: Oncology

## 2023-07-13 ENCOUNTER — Encounter: Payer: Self-pay | Admitting: Oncology

## 2023-07-13 VITALS — BP 120/93 | HR 106 | Temp 96.2°F | Resp 18 | Wt 233.1 lb

## 2023-07-13 DIAGNOSIS — F209 Schizophrenia, unspecified: Secondary | ICD-10-CM | POA: Insufficient documentation

## 2023-07-13 DIAGNOSIS — C3412 Malignant neoplasm of upper lobe, left bronchus or lung: Secondary | ICD-10-CM | POA: Diagnosis present

## 2023-07-13 DIAGNOSIS — N281 Cyst of kidney, acquired: Secondary | ICD-10-CM | POA: Diagnosis not present

## 2023-07-13 DIAGNOSIS — E876 Hypokalemia: Secondary | ICD-10-CM | POA: Insufficient documentation

## 2023-07-13 DIAGNOSIS — C3492 Malignant neoplasm of unspecified part of left bronchus or lung: Secondary | ICD-10-CM | POA: Diagnosis not present

## 2023-07-13 DIAGNOSIS — J449 Chronic obstructive pulmonary disease, unspecified: Secondary | ICD-10-CM | POA: Insufficient documentation

## 2023-07-13 DIAGNOSIS — Z923 Personal history of irradiation: Secondary | ICD-10-CM | POA: Insufficient documentation

## 2023-07-13 DIAGNOSIS — I700 Atherosclerosis of aorta: Secondary | ICD-10-CM | POA: Diagnosis not present

## 2023-07-13 DIAGNOSIS — D3502 Benign neoplasm of left adrenal gland: Secondary | ICD-10-CM | POA: Insufficient documentation

## 2023-07-13 DIAGNOSIS — F419 Anxiety disorder, unspecified: Secondary | ICD-10-CM | POA: Insufficient documentation

## 2023-07-13 DIAGNOSIS — D1779 Benign lipomatous neoplasm of other sites: Secondary | ICD-10-CM | POA: Diagnosis not present

## 2023-07-13 DIAGNOSIS — F17210 Nicotine dependence, cigarettes, uncomplicated: Secondary | ICD-10-CM | POA: Insufficient documentation

## 2023-07-13 DIAGNOSIS — K8020 Calculus of gallbladder without cholecystitis without obstruction: Secondary | ICD-10-CM | POA: Diagnosis not present

## 2023-07-13 DIAGNOSIS — D649 Anemia, unspecified: Secondary | ICD-10-CM | POA: Insufficient documentation

## 2023-07-13 DIAGNOSIS — J439 Emphysema, unspecified: Secondary | ICD-10-CM | POA: Diagnosis not present

## 2023-07-13 DIAGNOSIS — I3139 Other pericardial effusion (noninflammatory): Secondary | ICD-10-CM | POA: Diagnosis not present

## 2023-07-13 DIAGNOSIS — Z593 Problems related to living in residential institution: Secondary | ICD-10-CM | POA: Diagnosis not present

## 2023-07-13 DIAGNOSIS — F32A Depression, unspecified: Secondary | ICD-10-CM | POA: Diagnosis not present

## 2023-07-13 DIAGNOSIS — J441 Chronic obstructive pulmonary disease with (acute) exacerbation: Secondary | ICD-10-CM | POA: Insufficient documentation

## 2023-07-13 DIAGNOSIS — D3501 Benign neoplasm of right adrenal gland: Secondary | ICD-10-CM | POA: Insufficient documentation

## 2023-07-13 DIAGNOSIS — I2699 Other pulmonary embolism without acute cor pulmonale: Secondary | ICD-10-CM | POA: Diagnosis not present

## 2023-07-13 DIAGNOSIS — Z79899 Other long term (current) drug therapy: Secondary | ICD-10-CM | POA: Insufficient documentation

## 2023-07-13 DIAGNOSIS — Z9221 Personal history of antineoplastic chemotherapy: Secondary | ICD-10-CM | POA: Diagnosis not present

## 2023-07-13 DIAGNOSIS — Z8673 Personal history of transient ischemic attack (TIA), and cerebral infarction without residual deficits: Secondary | ICD-10-CM | POA: Insufficient documentation

## 2023-07-13 DIAGNOSIS — I10 Essential (primary) hypertension: Secondary | ICD-10-CM | POA: Diagnosis not present

## 2023-07-13 DIAGNOSIS — Z7901 Long term (current) use of anticoagulants: Secondary | ICD-10-CM | POA: Insufficient documentation

## 2023-07-13 HISTORY — DX: Anemia, unspecified: D64.9

## 2023-07-13 LAB — CBC WITH DIFFERENTIAL (CANCER CENTER ONLY)
Abs Immature Granulocytes: 0.04 10*3/uL (ref 0.00–0.07)
Basophils Absolute: 0 10*3/uL (ref 0.0–0.1)
Basophils Relative: 1 %
Eosinophils Absolute: 0.2 10*3/uL (ref 0.0–0.5)
Eosinophils Relative: 3 %
HCT: 31.5 % — ABNORMAL LOW (ref 39.0–52.0)
Hemoglobin: 9.9 g/dL — ABNORMAL LOW (ref 13.0–17.0)
Immature Granulocytes: 1 %
Lymphocytes Relative: 11 %
Lymphs Abs: 0.7 10*3/uL (ref 0.7–4.0)
MCH: 29.7 pg (ref 26.0–34.0)
MCHC: 31.4 g/dL (ref 30.0–36.0)
MCV: 94.6 fL (ref 80.0–100.0)
Monocytes Absolute: 0.7 10*3/uL (ref 0.1–1.0)
Monocytes Relative: 13 %
Neutro Abs: 4.1 10*3/uL (ref 1.7–7.7)
Neutrophils Relative %: 71 %
Platelet Count: 341 10*3/uL (ref 150–400)
RBC: 3.33 MIL/uL — ABNORMAL LOW (ref 4.22–5.81)
RDW: 15.3 % (ref 11.5–15.5)
WBC Count: 5.7 10*3/uL (ref 4.0–10.5)
nRBC: 0 % (ref 0.0–0.2)

## 2023-07-13 LAB — RETIC PANEL
Immature Retic Fract: 24 % — ABNORMAL HIGH (ref 2.3–15.9)
RBC.: 3.33 MIL/uL — ABNORMAL LOW (ref 4.22–5.81)
Retic Count, Absolute: 84.2 10*3/uL (ref 19.0–186.0)
Retic Ct Pct: 2.5 % (ref 0.4–3.1)
Reticulocyte Hemoglobin: 23 pg — ABNORMAL LOW (ref >27.9)

## 2023-07-13 LAB — VITAMIN B12: Vitamin B-12: 1023 pg/mL — ABNORMAL HIGH (ref 180–914)

## 2023-07-13 LAB — CMP (CANCER CENTER ONLY)
ALT: 13 U/L (ref 0–44)
AST: 17 U/L (ref 15–41)
Albumin: 3.2 g/dL — ABNORMAL LOW (ref 3.5–5.0)
Alkaline Phosphatase: 65 U/L (ref 38–126)
Anion gap: 9 (ref 5–15)
BUN: 6 mg/dL — ABNORMAL LOW (ref 8–23)
CO2: 24 mmol/L (ref 22–32)
Calcium: 8.9 mg/dL (ref 8.9–10.3)
Chloride: 106 mmol/L (ref 98–111)
Creatinine: 0.68 mg/dL (ref 0.61–1.24)
GFR, Estimated: >60 mL/min (ref >60)
Glucose, Bld: 112 mg/dL — ABNORMAL HIGH (ref 70–99)
Potassium: 3.4 mmol/L — ABNORMAL LOW (ref 3.5–5.1)
Sodium: 139 mmol/L (ref 135–145)
Total Bilirubin: 0.3 mg/dL (ref <1.2)
Total Protein: 7.2 g/dL (ref 6.5–8.1)

## 2023-07-13 LAB — IRON AND TIBC
Iron: 24 ug/dL — ABNORMAL LOW (ref 45–182)
Saturation Ratios: 9 % — ABNORMAL LOW (ref 17.9–39.5)
TIBC: 270 ug/dL (ref 250–450)
UIBC: 246 ug/dL

## 2023-07-13 LAB — FERRITIN: Ferritin: 92 ng/mL (ref 24–336)

## 2023-07-13 LAB — FOLATE: Folate: 19 ng/mL (ref >5.9)

## 2023-07-13 NOTE — Assessment & Plan Note (Signed)
Recent COPD exacerbation.  Continue albuterol inhaler PRN, Duonebs.  Recommend patient to make a follow up appt with pulmonology Dr. Meredeth Ide.

## 2023-07-13 NOTE — Assessment & Plan Note (Signed)
Recommend Elqiuis 10mg  BID x 7 days followed by Eliquis 5mg  BID. Likely provoked due to recent admission.  He was on Aspirin 81mg  daily and Plavix 75mg  daily.  I recommend him to stop Aspirin.  Will reach out to his PCP to see if Plavix needs to be continued.

## 2023-07-13 NOTE — Progress Notes (Signed)
DISCONTINUE ON PATHWAY REGIMEN - Non-Small Cell Lung     A cycle is every 7 days, concurrent with RT:     Paclitaxel      Carboplatin   **Always confirm dose/schedule in your pharmacy ordering system**  REASON: Continuation Of Treatment PRIOR TREATMENT: [id number]: Carboplatin AUC=2 + Paclitaxel 45 mg/m2 Weekly During Radiation TREATMENT RESPONSE: Stable Disease (SD)  START ON PATHWAY REGIMEN - Non-Small Cell Lung     A cycle is every 14 days:     Durvalumab   **Always confirm dose/schedule in your pharmacy ordering system**  Patient Characteristics: Preoperative or Nonsurgical Candidate (Clinical Staging), Stage III - Nonsurgical Candidate (Nonsquamous and Squamous), PS = 0, 1 Therapeutic Status: Preoperative or Nonsurgical Candidate (Clinical Staging) AJCC T Category: cT1b AJCC N Category: cN3 AJCC M Category: cM0 AJCC 8 Stage Grouping: IIIB ECOG Performance Status: 1 Intent of Therapy: Curative Intent, Discussed with Patient

## 2023-07-13 NOTE — Assessment & Plan Note (Addendum)
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  Left upper lobe adenocarcinoma, No enough tissue for NGS, will send off liquid biopsy- send off liquid biopsy.  Labs are reviewed and discussed with patient. S/p concurrent chemotherapy radiation. -carboplatin AUC 2 and taxol 45mg /m2 today.  CT showed stable disease, incidental PE  Recommend maintenance Durvalumab. - Rationale and side effects were reviewed with patient.   He prefers to postpone treatment until Christmas. I think it is reasonable given his recent COPD exacerbation.

## 2023-07-13 NOTE — Assessment & Plan Note (Signed)
Check iron tibc ferritin, B12 and Folate.

## 2023-07-13 NOTE — Assessment & Plan Note (Signed)
Recent psyc admission.  Follow up with psychiatrist.

## 2023-07-13 NOTE — Progress Notes (Signed)
Hematology/Oncology Progress note [phone] [phone]        REFERRING PROVIDER: Emogene Morgan, MD    CHIEF COMPLAINTS/PURPOSE OF CONSULTATION:  Stage III Lung cancer  ASSESSMENT & PLAN:   Cancer Staging  Adenocarcinoma of left lung [facility]) Staging form: Lung, AJCC 8th Edition - Clinical stage from 03/22/2023: cT1, cN3, cM0 - Signed by [name], MD on 03/22/2023   Adenocarcinoma of left lung (HCC) Clinically he has Stage III left lung cancer He has right paratracheal lymph node hypermetabolic activity, although Station 7 lymph node showed atypical cell, no definite malignancy.  Left upper lobe adenocarcinoma, No enough tissue for NGS, will send off liquid biopsy- send off liquid biopsy.  Labs are reviewed and discussed with patient. S/p concurrent chemotherapy radiation. -carboplatin AUC 2 and taxol 45mg /m2 today.  CT showed stable disease, incidental PE  Recommend maintenance Durvalumab. - Rationale and side effects were reviewed with patient.   He prefers to postpone treatment until Christmas. I think it is reasonable given his recent COPD exacerbation.   COPD (chronic obstructive pulmonary disease) (HCC) Recent COPD exacerbation.  Continue albuterol inhaler PRN, Duonebs.  Recommend patient to make a follow up appt with pulmonology Dr. Meredeth Ide.   Anxiety and depression Recent psyc admission.  Follow up with psychiatrist.   Pulmonary embolism (HCC) Recommend Elqiuis 10mg  BID x 7 days followed by Eliquis 5mg  BID. Likely provoked due to recent admission.  He was on Aspirin 81mg  daily and Plavix 75mg  daily.  I recommend him to stop Aspirin.  Will reach out to his PCP to see if Plavix needs to be continued.   Normocytic anemia Check iron tibc ferritin, B12 and Folate.    Orders Placed This Encounter  Procedures   Consent Attestation for Oncology Treatment    Order Specific Question:   The patient is informed of risks, benefits, side-effects of  the prescribed oncology treatment. Potential short term and long term side effects and response rates discussed. After a long discussion, the patient made informed decision to proceed.    Answer:   Yes   CBC with Differential (Cancer Center Only)    Standing Status:   Future    Standing Expiration Date:   08/10/2024   CMP (Cancer Center only)    Standing Status:   Future    Standing Expiration Date:   08/10/2024   T4    Standing Status:   Future    Standing Expiration Date:   08/10/2024   TSH    Standing Status:   Future    Standing Expiration Date:   08/10/2024   CBC with Differential (Cancer Center Only)    Standing Status:   Future    Standing Expiration Date:   08/24/2024   CMP (Cancer Center only)    Standing Status:   Future    Standing Expiration Date:   08/24/2024   CBC with Differential (Cancer Center Only)    Standing Status:   Future    Number of Occurrences:   1    Standing Expiration Date:   07/12/2024   CMP (Cancer Center only)    Standing Status:   Future    Number of Occurrences:   1    Standing Expiration Date:   07/12/2024   Iron and TIBC    Standing Status:   Future    Number of Occurrences:   1    Standing Expiration Date:   07/12/2024   Ferritin    Standing Status:  Future    Number of Occurrences:   1    Standing Expiration Date:   07/12/2024   Retic Panel    Standing Status:   Future    Number of Occurrences:   1    Standing Expiration Date:   07/12/2024   Vitamin B12    Standing Status:   Future    Number of Occurrences:   1    Standing Expiration Date:   07/12/2024   Folate    Standing Status:   Future    Number of Occurrences:   1    Standing Expiration Date:   07/12/2024   PHYSICIAN COMMUNICATION ORDER    Thyroid function tests at baseline and every 3rd cycle   Follow up 5 weeks. Lab MD Durvalumab All questions were answered. The patient knows to call the clinic with any problems, questions or concerns.  [name], MD, PhD [facility] Hematology  Oncology 07/13/2023    HISTORY OF PRESENTING ILLNESS:  [name] 62 y.o. male presents to establish care for lung mass I have reviewed his chart and materials related to his cancer extensively and collaborated history with the patient. Summary of oncologic history is as follows:  12/08/2021 CT chest with contrast showed irregular solid pulmonary nodule of right upper lobe, increased in size.  Bibasilar consolidation.  Mildly enlarged right lower paratracheal lymph node.  Atherosclerosis. 01/04/2022 PET scan showed 10 mm irregular nodule in the lateral right upper lobe, suspicious for primary bronchogenic neoplasm. 2 small right paratracheal nodes, indeterminate and favored to be reactive.  Attention on follow-up.  Patient was seen by radiation oncology Dr. Rushie Chestnut.  Patient was offered SBRT to the right upper lobe nodule/presumed non-small cell lung cancer stage I.  Patient lives in a group home.  He reports chronic cough.  Some shortness of breath with exertion. He is a current everyday smoker.  Few cigarettes per day.  He denies any unintentional weight loss, night sweats, fever or chills. Patient is on aspirin and Plavix   INTERVAL HISTORY [name] is a 62 y.o. male who has above history reviewed by me today presents for follow up visit for lung cancer.  Oncology History  Adenocarcinoma of left lung (HCC)  12/29/2022 Imaging   CT chest with contrast showed Stable radiation changes involving the right upper lobe with a small residual treated nodule.  Decrease in size.  Interval enlargement of the left upper lobe pulmonary nodule.  Stable mediastinal nodes stable advanced emphysematous changes and areas of pulmonary scarring.  Stable bilateral adrenal gland adenomas.  Emphysema   01/30/2023 Imaging   PET 1. Recurrent bronchogenic carcinoma in the left upper lobe with new and increasingly hypermetabolic mediastinal lymph nodes. No evidence of distant metastatic disease. 2. Possible  mild prostate hypermetabolism. Consider laboratory correlation. 3. Liver is mildly heterogeneous, raising suspicion for steatosis. 4. Cholelithiasis. 5. Right adrenal adenoma.  Left adrenal myelolipoma. 6. Aortic atherosclerosis (ICD10-I70.0). Coronary artery calcification. 7. Enlarged pulmonic trunk, indicative of pulmonary arterial hypertension.   03/15/2023 Initial Diagnosis   Adenocarcinoma of left lung   Bronchoscopy showed  Lung, biopsy, Left upper lobe - NON-SMALL CELL CARCINOMA, FAVOR ADENOCARCINOMA.  1. Bronchial Lavage, Left upper lobe - POSITIVE FOR MALIGNANCY. - NON-SMALL CELL CARCINOMA, FAVOR ADENOCARCINOMA. - SEE NOTE. 2. Bronchial Brushing, Left upper lobe - POSITIVE FOR MALIGNANCY. - NON-SMALL CELL CARCINOMA, FAVOR ADENOCARCINOMA. 3. Bronchus, biopsy, left upper lobe - SEE [id number] 4. Fine Needle Aspiration, left upper lobe - POSITIVE FOR MALIGNANCY. -  NON-SMALL CELL CARCINOMA, FAVOR ADENOCARCINOMA. 5. Fine Needle Aspiration, station 7 lymph node - ATYPICAL. - BACKGROUND LYMPHOID TISSUE IS PRESENT CONSISTENT WITH LYMPH NODE SAMPLING.    03/15/2023 Procedure   Fiberoptic bronchoscopy with airway inspection and BAL Procedure findings:   Bronchoscope was inserted via ETT  without difficulty.  Posterior oropharynx, epiglottis, arytenoids, false cords and vocal cords were not visualized as these were bypassed by endotracheal tube. The distal trachea was normal in circumference and appearance without mucosal, cartilaginous or branching abnormalities.  The main carina was mildly splayed . All right and left lobar airways were visualized to the Subsegmental level.  Sub- sub segmental carinae were identified in all the distal airways.   Secretions were visible in the following airways and appeared to be clear.  The mucosa was : friable at LEFT UPPER LOBE   Airways were notable for:        exophytic lesions :n       extrinsic compression in the following  distributions: n.       Friable mucosa: y       Teacher, music /pigmentation: n   MUCUS PLUGGING WAS SEVERE ON LEFT SIDE AND COMPLETE OBSTRUCTED MULTIPLE AIRWAYS.  UNABLE TO NAVIGATE THROUGH TRACHEOBRONCHIAL TREE DUE TO SEVERE MUCUS PLUGGING. THERAPEUTIC ASPIRATION WAS PERFORMED X 7 AND WAS DONE BILATERALLY ALTHOUGH WORSE ON LEFT.  BAL WAS PERFORMED AT LEFT UPPER LOBE X 2.     Endobronchial ultrasound assisted hilar and mediastinal lymph node biopsies procedure findings: The fiberoptic bronchoscope was removed and the EBUS scope was introduced. Examination began to evaluate for pathologically enlarged lymph nodes starting on the RIGHT  side progressing to the LEFT side.  All lymph node biopsies performed with 21G  needle. Lymph node biopsies were sent in cytolite for all stations.   STATION 10R - 6mm not biopsied STATION 7 - 1.2cm biopsied 3 times STATION 10L - 5mm not biopsied  STATION 4L - 6mm not biopsied    03/22/2023 Cancer Staging   Staging form: Lung, AJCC 8th Edition - Clinical stage from 03/22/2023: cT1, cN3, cM0 - Signed by [name], MD on 03/22/2023 Stage prefix: Initial diagnosis   04/20/2023 - 06/08/2023 Chemotherapy   Patient is on Treatment Plan : LUNG Carboplatin + Paclitaxel + XRT q7d     08/11/2023 -  Chemotherapy   Patient is on Treatment Plan : LUNG Durvalumab (10) q14d        Patient presents for evaluation prior to chemotherapy. He reports feel well He was recently hospitalized due to pneumonia, and COPD exacerbation.  Also recent inpatient psyc admission for depression.  Today he reports feeling well at baseline.  Chronic SOB and cough, not worse. No fever or chills.  He has started on Eliquis treatments. He knows that he should not take Aspirin 81mg .  He does not know why he is taking Plavix.  No bleeding.     MEDICAL HISTORY:  Past Medical History:  Diagnosis Date   Acute on chronic respiratory failure with hypoxia (HCC)    Asthma    Coagulation  disorder (HCC)    COPD (chronic obstructive pulmonary disease) (HCC)    Depression    History of hiatal hernia    Hypertension    Hypokalemia    Leukocytosis    Lung mass    Pneumonia    Schizophrenia (HCC)    Shortness of breath dyspnea    Stroke [facility])    Umbilical hernia    Ventral hernia  SURGICAL HISTORY: Past Surgical History:  Procedure Laterality Date   COLONOSCOPY WITH PROPOFOL N/A 09/21/2021   Procedure: COLONOSCOPY WITH PROPOFOL;  Surgeon: Regis Bill, MD;  Location: [facility] ENDOSCOPY;  Service: Endoscopy;  Laterality: N/A;   HERNIA REPAIR     INSERTION OF MESH N/A 12/18/2014   Procedure: INSERTION OF MESH;  Surgeon: Lattie Haw, MD;  Location: [facility] ORS;  Service: General;  Laterality: N/A;   SUPRA-UMBILICAL HERNIA  12/18/2014   Procedure: SUPRA-UMBILICAL HERNIA;  Surgeon: Lattie Haw, MD;  Location: [facility] ORS;  Service: General;;   UMBILICAL HERNIA REPAIR N/A 12/18/2014   Procedure: HERNIA REPAIR UMBILICAL ADULT;  Surgeon: Lattie Haw, MD;  Location: [facility] ORS;  Service: General;  Laterality: N/A;   VIDEO BRONCHOSCOPY WITH ENDOBRONCHIAL ULTRASOUND N/A 03/15/2023   Procedure: VIDEO BRONCHOSCOPY WITH ENDOBRONCHIAL ULTRASOUND;  Surgeon: Vida Rigger, MD;  Location: [facility] ORS;  Service: Thoracic;  Laterality: N/A;    SOCIAL HISTORY: Social History   Socioeconomic History   Marital status: Widowed    Spouse name: Not on file   Number of children: Not on file   Years of education: Not on file   Highest education level: Not on file  Occupational History   Not on file  Tobacco Use   Smoking status: Every Day    Current packs/day: 0.15    Average packs/day: 0.2 packs/day for 45.0 years (6.8 ttl pk-yrs)    Types: Cigarettes   Smokeless tobacco: Never  Vaping Use   Vaping status: Never Used  Substance and Sexual Activity   Alcohol use: Not Currently    Alcohol/week: 75.0 standard drinks of alcohol    Types: 75 Cans of beer per week   Drug use:  Not Currently    Types: Marijuana, "Crack" cocaine    Comment: none in 15 yrs   Sexual activity: Not Currently  Other Topics Concern   Not on file  Social History Narrative   Not on file   Social Determinants of Health   Financial Resource Strain: Not on file  Food Insecurity: No Food Insecurity (06/16/2023)   Hunger Vital Sign    Worried About Running Out of Food in the Last Year: Never true    Ran Out of Food in the Last Year: Never true  Transportation Needs: No Transportation Needs (06/16/2023)   PRAPARE - Administrator, Civil Service (Medical): No    Lack of Transportation (Non-Medical): No  Physical Activity: Not on file  Stress: Not on file  Social Connections: Not on file  Intimate Partner Violence: Not At Risk (06/16/2023)   Humiliation, Afraid, Rape, and Kick questionnaire    Fear of Current or Ex-Partner: No    Emotionally Abused: No    Physically Abused: No    Sexually Abused: No    FAMILY HISTORY: Family History  Problem Relation Age of Onset   Asthma Mother    Hypertension Father     ALLERGIES:  has No Known Allergies.  MEDICATIONS:  Current Outpatient Medications  Medication Sig Dispense Refill   albuterol (VENTOLIN HFA) 108 (90 Base) MCG/ACT inhaler Inhale 1-2 puffs into the lungs every 6 (six) hours as needed for wheezing or shortness of breath. 8 g 0   APIXABAN (ELIQUIS) VTE STARTER PACK (10MG  AND 5MG ) Take as directed on package: start with two-5mg  tablets twice daily for 7 days. On day 8, switch to one-5mg  tablet twice daily. 74 each 0   clopidogrel (PLAVIX) 75 MG tablet Take 1 tablet (  75 mg total) by mouth daily. 30 tablet 0   diltiazem (CARDIZEM CD) 240 MG 24 hr capsule Take 1 capsule (240 mg total) by mouth daily. 30 capsule 3   famotidine (PEPCID) 20 MG tablet Take 1 tablet (20 mg total) by mouth daily. 30 tablet 0   FLUoxetine (PROZAC) 20 MG capsule Take 1 capsule (20 mg total) by mouth daily. 30 capsule 3    Fluticasone-Umeclidin-Vilant (TRELEGY ELLIPTA) 100-62.5-25 MCG/ACT AEPB Inhale 1 puff into the lungs daily. 28 each 1   guaiFENesin (MUCINEX) 600 MG 12 hr tablet Take 1 tablet (600 mg total) by mouth 2 (two) times daily. 15 tablet 1   hydrOXYzine (ATARAX) 10 MG tablet Take 1 tablet (10 mg total) by mouth every 6 (six) hours as needed for anxiety. 30 tablet 0   ipratropium-albuterol (DUONEB) 0.5-2.5 (3) MG/3ML SOLN Take 3 mLs by nebulization 3 (three) times daily. 360 mL 1   mirtazapine (REMERON) 7.5 MG tablet Take 1 tablet (7.5 mg total) by mouth at bedtime. 30 tablet 0   montelukast (SINGULAIR) 10 MG tablet Take 1 tablet (10 mg total) by mouth at bedtime. 30 tablet 1   pravastatin (PRAVACHOL) 20 MG tablet Take 1 tablet (20 mg total) by mouth daily at 6 PM. 30 tablet 0   QUEtiapine (SEROQUEL) 300 MG tablet Take 2 tablets (600 mg total) by mouth at bedtime. 60 tablet 0   senna-docusate (SENOKOT-S) 8.6-50 MG tablet Take 1 tablet by mouth daily after breakfast. 30 tablet 0   traZODone (DESYREL) 100 MG tablet Take 2 tablets (200 mg total) by mouth at bedtime as needed for sleep. 30 tablet 0   umeclidinium bromide (INCRUSE ELLIPTA) 62.5 MCG/ACT AEPB Inhale 1 puff into the lungs daily. 7 each 1   No current facility-administered medications for this visit.    Review of Systems  Constitutional:  Negative for appetite change, chills, fatigue, fever and unexpected weight change.  HENT:   Negative for hearing loss and voice change.   Eyes:  Negative for eye problems and icterus.  Respiratory:  Positive for cough. Negative for chest tightness, hemoptysis and shortness of breath.   Cardiovascular:  Negative for chest pain and leg swelling.  Gastrointestinal:  Negative for abdominal distention and abdominal pain.  Endocrine: Negative for hot flashes.  Genitourinary:  Negative for difficulty urinating, dysuria and frequency.   Musculoskeletal:  Negative for arthralgias.  Skin:  Negative for itching and  rash.  Neurological:  Negative for light-headedness and numbness.  Hematological:  Negative for adenopathy. Does not bruise/bleed easily.  Psychiatric/Behavioral:  Negative for confusion.      PHYSICAL EXAMINATION: ECOG PERFORMANCE STATUS: 0 - Asymptomatic  Vitals:   07/13/23 0929  BP: (!) 120/93  Pulse: (!) 106  Resp: 18  Temp: (!) 96.2 F (35.7 C)   Filed Weights   07/13/23 0929  Weight: 233 lb 1.6 oz (105.7 kg)    Physical Exam Constitutional:      General: He is not in acute distress.    Appearance: He is not diaphoretic.  HENT:     Head: Normocephalic and atraumatic.  Eyes:     General: No scleral icterus. Cardiovascular:     Rate and Rhythm: Normal rate and regular rhythm.  Pulmonary:     Effort: Pulmonary effort is normal. No respiratory distress.     Comments: Decreased breath sound bilaterally. Abdominal:     General: There is no distension.     Palpations: Abdomen is soft.  Tenderness: There is no abdominal tenderness.  Musculoskeletal:        General: Normal range of motion.     Cervical back: Normal range of motion and neck supple.  Skin:    General: Skin is warm and dry.     Findings: No erythema.  Neurological:     Mental Status: He is alert and oriented to person, place, and time. Mental status is at baseline.     Motor: No abnormal muscle tone.  Psychiatric:        Mood and Affect: Affect normal.     Comments: Flat      LABORATORY DATA:  I have reviewed the data as listed    Latest Ref Rng & Units 07/13/2023   10:05 AM 06/16/2023    5:13 AM 06/15/2023    4:22 AM  CBC  WBC 4.0 - 10.5 K/uL 5.7  5.3  6.4   Hemoglobin 13.0 - 17.0 g/dL 9.9  9.5  9.5   Hematocrit 39.0 - 52.0 % 31.5  28.9  27.6   Platelets 150 - 400 K/uL 341  154  153       Latest Ref Rng & Units 07/13/2023   10:06 AM 06/16/2023    5:13 AM 06/15/2023    4:22 AM  CMP  Glucose 70 - 99 mg/dL 409  811  914   BUN 8 - 23 mg/dL 6  14  13    Creatinine 0.61 - 1.24 mg/dL 7.82   9.56  2.13   Sodium 135 - 145 mmol/L 139  137  138   Potassium 3.5 - 5.1 mmol/L 3.4  3.8  4.0   Chloride 98 - 111 mmol/L 106  103  105   CO2 22 - 32 mmol/L 24  25  26    Calcium 8.9 - 10.3 mg/dL 8.9  9.0  8.9   Total Protein 6.5 - 8.1 g/dL 7.2     Total Bilirubin <1.2 mg/dL 0.3     Alkaline Phos 38 - 126 U/L 65     AST 15 - 41 U/L 17     ALT 0 - 44 U/L 13        RADIOGRAPHIC STUDIES: I have personally reviewed the radiological images as listed and agreed with the findings in the report. CT Chest W Contrast  Result Date: 07/12/2023 CLINICAL DATA:  Lung cancer.  Restaging. EXAM: CT CHEST WITH CONTRAST TECHNIQUE: Multidetector CT imaging of the chest was performed during intravenous contrast administration. RADIATION DOSE REDUCTION: This exam was performed according to the departmental dose-optimization program which includes automated exposure control, adjustment of the mA and/or kV according to patient size and/or use of iterative reconstruction technique. CONTRAST:  75mL OMNIPAQUE IOHEXOL 300 MG/ML  SOLN COMPARISON:  05/25/2023 FINDINGS: Cardiovascular: Heart size appears normal. There is trace pericardial effusion, new from previous exam. Incidental pulmonary embolus identified with filling defects within segmental branches to the lingula and left lower lobe, image 83/2 and image 89/2. aortic atherosclerosis. Coronary artery calcifications. Mediastinum/Nodes: Thyroid gland, trachea, and esophagus appear normal. No enlarged mediastinal or hilar lymph nodes. Lungs/Pleura: Emphysema. Left upper lobe spiculated nodule is again seen measuring 1.7 x 1.2 by 2.0 cm, 49/4. On the previous exam this measured 1.6 x 1.0 by 2.0 cm. Stable post treatment changes within the periphery of the right upper lobe. Subsegmental atelectasis is noted within the lung bases. The previously noted new nodule within the right upper lobe measuring 4 mm has resolved since 05/25/2023. Upper Abdomen: Bilateral kidney  cysts are  again noted. The largest arises off the lateral cortex of the right kidney measuring 3.5 cm, image 172/2. Unchanged benign right adrenal nodule and left adrenal myelolipoma. No follow-up imaging recommended. Musculoskeletal: No acute or suspicious osseous findings. IMPRESSION: 1. Incidental pulmonary embolus identified with filling defects within segmental branches to the lingula and left lower lobe. 2. Stable appearance of left upper lobe spiculated nodule. 3. Resolution of previous 4 mm right upper lobe lung nodule. 4. Stable post treatment changes within the periphery of the right upper lobe. 5. Coronary artery calcifications. 6. Aortic Atherosclerosis (ICD10-I70.0) and Emphysema (ICD10-J43.9). Electronically Signed   By: Signa Kell M.D.   On: 07/12/2023 13:47   DG Chest Port 1 View  Result Date: 06/13/2023 CLINICAL DATA:  COPD exacerbation, cough, and wheezing EXAM: PORTABLE CHEST 1 VIEW COMPARISON:  Chest radiograph dated 05/28/2023 FINDINGS: Normal lung volumes. Bilateral lower lung linear opacities. No pleural effusion or pneumothorax. The heart size and mediastinal contours are within normal limits. No acute osseous abnormality. IMPRESSION: Bilateral lower lung linear opacities, likely atelectasis. Electronically Signed   By: Agustin Cree M.D.   On: 06/13/2023 15:56

## 2023-07-14 ENCOUNTER — Telehealth: Payer: Self-pay

## 2023-07-14 NOTE — Telephone Encounter (Addendum)
Received Call back from nurse Deanna Artis, who states that pt has not been seen since July. Dr. Letta Pate is not in office today, but she will contact pt to schedule follow up and will inform Dr. Letta Pate about discontinuing Plavix. She will contact us to let us know outcome.

## 2023-07-14 NOTE — Telephone Encounter (Signed)
Spoke with Lanora Manis at [facility] to see if Dr. Letta Pate is ok wtih discontinuing Plavix 75 mg now that pt is on Eliquis. She will relay message to Dr. Letta Pate and will have nurse give [place] a call back.

## 2023-07-17 ENCOUNTER — Ambulatory Visit: Payer: 59 | Admitting: Radiation Oncology

## 2023-07-27 ENCOUNTER — Encounter: Payer: Self-pay | Admitting: Radiation Oncology

## 2023-07-27 ENCOUNTER — Ambulatory Visit
Admission: RE | Admit: 2023-07-27 | Discharge: 2023-07-27 | Disposition: A | Discharge status: Home / Self Care | Payer: 59 | Source: Ambulatory Visit | Attending: Radiation Oncology | Admitting: Radiation Oncology

## 2023-07-27 VITALS — BP 124/81 | HR 98 | Temp 97.4°F | Ht 73.0 in | Wt 231.0 lb

## 2023-07-27 DIAGNOSIS — C3412 Malignant neoplasm of upper lobe, left bronchus or lung: Secondary | ICD-10-CM | POA: Insufficient documentation

## 2023-07-27 DIAGNOSIS — C3411 Malignant neoplasm of upper lobe, right bronchus or lung: Secondary | ICD-10-CM | POA: Insufficient documentation

## 2023-07-27 NOTE — Progress Notes (Signed)
Radiation Oncology Follow up Note  Name: [name]   Date:   07/27/2023 MRN:  [id number] DOB: [birth year]    This 62 y.o. male presents to the clinic today for 1 month follow-up status post concurrent chemoradiation therapy for.  Stage III (cT1 cN3 M0) adenocarcinoma the left lung  REFERRING PROVIDER: Emogene Morgan, MD  HPI: Patient is a 62 year old male now seen out 1 month having completed concurrent chemoradiation therapy for stage III adenocarcinoma the left lung.Marland Kitchen  His treatment at times were interrupted by psych admission although he present time he is doing well specifically [place] cough hemoptysis or chest tightness.  He is currently on Eliquis for a pulmonary embolism.  He had a recent CT scan this month showing pulmonary embolus.  He had stable appearance of the left upper lobe with spiculated nodule and resolution of previous 4 mm right upper lobe nodule.  He is current currently on Durvalumab which he is tolerating well specifically his cough hemoptysis or chest tightness.  COMPLICATIONS OF TREATMENT: none  FOLLOW UP COMPLIANCE: keeps appointments   PHYSICAL EXAM:  BP 124/81   Pulse 98   Temp (!) 97.4 F (36.3 C) (Tympanic)   Ht 6\' 1"  (1.854 m)   Wt 231 lb (104.8 kg)   BMI 30.48 kg/m  Well-developed well-nourished patient in NAD. HEENT reveals PERLA, EOMI, discs not visualized.  Oral cavity is clear. No oral mucosal lesions are identified. Neck is clear without evidence of cervical or supraclavicular adenopathy. Lungs are clear to A&P. Cardiac examination is essentially unremarkable with regular rate and rhythm without murmur rub or thrill. Abdomen is benign with no organomegaly or masses noted. Motor sensory and DTR levels are equal and symmetric in the upper and lower extremities. Cranial nerves II through XII are grossly intact. Proprioception is intact. No peripheral adenopathy or edema is identified. No motor or sensory levels are noted. Crude visual fields are within  normal range.  RADIOLOGY RESULTS: CT scan reviewed  PLAN: Present time patient is doing well he is currently under treatment through medical oncology with immunotherapy Durvalumab which he is tolerating well.  Of asked to see him back in 4 months for follow-up.  Will attempt to have a CT scan performed around that time.  Patient knows to call at anytime with any concerns.  I would like to take this opportunity to thank you for allowing me to participate in the care of your patient.Carmina Miller, MD

## 2023-07-28 ENCOUNTER — Other Ambulatory Visit: Payer: Self-pay

## 2023-07-31 NOTE — Progress Notes (Signed)
Pharmacist Chemotherapy Monitoring - Initial Assessment    Anticipated start date: 08/11/23   The following has been reviewed per standard work regarding the patient's treatment regimen: The patient's diagnosis, treatment plan and drug doses, and organ/hematologic function Lab orders and baseline tests specific to treatment regimen  The treatment plan start date, drug sequencing, and pre-medications Prior authorization status  Patient's documented medication list, including drug-drug interaction screen and prescriptions for anti-emetics and supportive care specific to the treatment regimen The drug concentrations, fluid compatibility, administration routes, and timing of the medications to be used The patient's access for treatment and lifetime cumulative dose history, if applicable  The patient's medication allergies and previous infusion related reactions, if applicable   Changes made to treatment plan:  N/A  Follow up needed:  N/A   Ebony Hail, Pharm.D., CPP 07/31/2023@12 :56 PM

## 2023-08-04 ENCOUNTER — Encounter: Payer: Self-pay | Admitting: Oncology

## 2023-08-10 NOTE — Assessment & Plan Note (Addendum)
 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  Left upper lobe adenocarcinoma, No enough tissue for NGS, will send off liquid biopsy- send off liquid biopsy.  Labs are reviewed and discussed with patient. S/p concurrent chemotherapy radiation. -carboplatin  AUC 2 and taxol  45mg /m2 today.  CT showed stable disease, incidental PE  Hold Durvalumab  due to acute COPD exacerbation.

## 2023-08-11 ENCOUNTER — Inpatient Hospital Stay: Payer: 59

## 2023-08-11 ENCOUNTER — Telehealth: Payer: Self-pay

## 2023-08-11 ENCOUNTER — Other Ambulatory Visit: Payer: 59

## 2023-08-11 ENCOUNTER — Inpatient Hospital Stay: Payer: 59 | Attending: Oncology | Admitting: Oncology

## 2023-08-11 ENCOUNTER — Encounter: Payer: Self-pay | Admitting: Oncology

## 2023-08-11 VITALS — BP 132/98 | HR 109 | Temp 96.0°F | Resp 18 | Wt 227.8 lb

## 2023-08-11 DIAGNOSIS — Z7982 Long term (current) use of aspirin: Secondary | ICD-10-CM | POA: Insufficient documentation

## 2023-08-11 DIAGNOSIS — Z5112 Encounter for antineoplastic immunotherapy: Secondary | ICD-10-CM | POA: Diagnosis not present

## 2023-08-11 DIAGNOSIS — I700 Atherosclerosis of aorta: Secondary | ICD-10-CM | POA: Diagnosis not present

## 2023-08-11 DIAGNOSIS — I3139 Other pericardial effusion (noninflammatory): Secondary | ICD-10-CM | POA: Diagnosis not present

## 2023-08-11 DIAGNOSIS — Z7902 Long term (current) use of antithrombotics/antiplatelets: Secondary | ICD-10-CM | POA: Diagnosis not present

## 2023-08-11 DIAGNOSIS — I2699 Other pulmonary embolism without acute cor pulmonale: Secondary | ICD-10-CM | POA: Insufficient documentation

## 2023-08-11 DIAGNOSIS — K8020 Calculus of gallbladder without cholecystitis without obstruction: Secondary | ICD-10-CM | POA: Insufficient documentation

## 2023-08-11 DIAGNOSIS — F209 Schizophrenia, unspecified: Secondary | ICD-10-CM | POA: Insufficient documentation

## 2023-08-11 DIAGNOSIS — Z720 Tobacco use: Secondary | ICD-10-CM | POA: Diagnosis not present

## 2023-08-11 DIAGNOSIS — J441 Chronic obstructive pulmonary disease with (acute) exacerbation: Secondary | ICD-10-CM | POA: Diagnosis not present

## 2023-08-11 DIAGNOSIS — J439 Emphysema, unspecified: Secondary | ICD-10-CM | POA: Insufficient documentation

## 2023-08-11 DIAGNOSIS — D1779 Benign lipomatous neoplasm of other sites: Secondary | ICD-10-CM | POA: Diagnosis not present

## 2023-08-11 DIAGNOSIS — F17210 Nicotine dependence, cigarettes, uncomplicated: Secondary | ICD-10-CM | POA: Insufficient documentation

## 2023-08-11 DIAGNOSIS — J449 Chronic obstructive pulmonary disease, unspecified: Secondary | ICD-10-CM

## 2023-08-11 DIAGNOSIS — Z7901 Long term (current) use of anticoagulants: Secondary | ICD-10-CM | POA: Diagnosis not present

## 2023-08-11 DIAGNOSIS — Z9221 Personal history of antineoplastic chemotherapy: Secondary | ICD-10-CM | POA: Diagnosis not present

## 2023-08-11 DIAGNOSIS — Z8673 Personal history of transient ischemic attack (TIA), and cerebral infarction without residual deficits: Secondary | ICD-10-CM | POA: Diagnosis not present

## 2023-08-11 DIAGNOSIS — C3412 Malignant neoplasm of upper lobe, left bronchus or lung: Secondary | ICD-10-CM | POA: Insufficient documentation

## 2023-08-11 DIAGNOSIS — D649 Anemia, unspecified: Secondary | ICD-10-CM | POA: Diagnosis not present

## 2023-08-11 DIAGNOSIS — I10 Essential (primary) hypertension: Secondary | ICD-10-CM | POA: Insufficient documentation

## 2023-08-11 DIAGNOSIS — D3502 Benign neoplasm of left adrenal gland: Secondary | ICD-10-CM | POA: Insufficient documentation

## 2023-08-11 DIAGNOSIS — N281 Cyst of kidney, acquired: Secondary | ICD-10-CM | POA: Diagnosis not present

## 2023-08-11 DIAGNOSIS — D3501 Benign neoplasm of right adrenal gland: Secondary | ICD-10-CM | POA: Insufficient documentation

## 2023-08-11 DIAGNOSIS — E876 Hypokalemia: Secondary | ICD-10-CM | POA: Diagnosis not present

## 2023-08-11 DIAGNOSIS — C3492 Malignant neoplasm of unspecified part of left bronchus or lung: Secondary | ICD-10-CM | POA: Diagnosis not present

## 2023-08-11 DIAGNOSIS — I2510 Atherosclerotic heart disease of native coronary artery without angina pectoris: Secondary | ICD-10-CM | POA: Diagnosis not present

## 2023-08-11 DIAGNOSIS — Z79899 Other long term (current) drug therapy: Secondary | ICD-10-CM | POA: Insufficient documentation

## 2023-08-11 LAB — CBC WITH DIFFERENTIAL (CANCER CENTER ONLY)
Abs Immature Granulocytes: 0.02 10*3/uL (ref 0.00–0.07)
Basophils Absolute: 0 10*3/uL (ref 0.0–0.1)
Basophils Relative: 0 %
Eosinophils Absolute: 0.1 10*3/uL (ref 0.0–0.5)
Eosinophils Relative: 3 %
HCT: 36.2 % — ABNORMAL LOW (ref 39.0–52.0)
Hemoglobin: 11.2 g/dL — ABNORMAL LOW (ref 13.0–17.0)
Immature Granulocytes: 0 %
Lymphocytes Relative: 23 %
Lymphs Abs: 1.2 10*3/uL (ref 0.7–4.0)
MCH: 28.6 pg (ref 26.0–34.0)
MCHC: 30.9 g/dL (ref 30.0–36.0)
MCV: 92.6 fL (ref 80.0–100.0)
Monocytes Absolute: 0.6 10*3/uL (ref 0.1–1.0)
Monocytes Relative: 12 %
Neutro Abs: 3 10*3/uL (ref 1.7–7.7)
Neutrophils Relative %: 62 %
Platelet Count: 213 10*3/uL (ref 150–400)
RBC: 3.91 MIL/uL — ABNORMAL LOW (ref 4.22–5.81)
RDW: 15.7 % — ABNORMAL HIGH (ref 11.5–15.5)
WBC Count: 4.9 10*3/uL (ref 4.0–10.5)
nRBC: 0 % (ref 0.0–0.2)

## 2023-08-11 LAB — CMP (CANCER CENTER ONLY)
ALT: 14 U/L (ref 0–44)
AST: 24 U/L (ref 15–41)
Albumin: 3.8 g/dL (ref 3.5–5.0)
Alkaline Phosphatase: 72 U/L (ref 38–126)
Anion gap: 10 (ref 5–15)
BUN: <5 mg/dL — ABNORMAL LOW (ref 8–23)
CO2: 23 mmol/L (ref 22–32)
Calcium: 9.3 mg/dL (ref 8.9–10.3)
Chloride: 105 mmol/L (ref 98–111)
Creatinine: 0.72 mg/dL (ref 0.61–1.24)
GFR, Estimated: >60 mL/min (ref >60)
Glucose, Bld: 144 mg/dL — ABNORMAL HIGH (ref 70–99)
Potassium: 3 mmol/L — ABNORMAL LOW (ref 3.5–5.1)
Sodium: 138 mmol/L (ref 135–145)
Total Bilirubin: 0.7 mg/dL (ref 0.0–1.2)
Total Protein: 7.6 g/dL (ref 6.5–8.1)

## 2023-08-11 LAB — TSH: TSH: 0.429 u[IU]/mL (ref 0.350–4.500)

## 2023-08-11 MED ORDER — PREDNISONE 10 MG (21) PO TBPK: ORAL_TABLET | ORAL | 0 refills | Status: DC

## 2023-08-11 MED ORDER — POTASSIUM CHLORIDE CRYS ER 20 MEQ PO TBCR: 20.0000 meq | EXTENDED_RELEASE_TABLET | Freq: Every day | ORAL | 0 refills | Status: DC

## 2023-08-11 NOTE — Telephone Encounter (Signed)
-----   Message from [name] sent at 08/11/2023 10:40 AM EST ----- Potasium is low. Please recommend him to take potassium daily x 5 day. I have sent RX thanks.

## 2023-08-11 NOTE — Assessment & Plan Note (Addendum)
 Acute COPD exacerbation.  Continue albuterol  inhaler PRN, Duonebs.  Poor air entry bilaterally.  I recommend patient to fo ER for management. He declined.  I prescribed him tapering course of prednisone . He knows to go to ER if prednisone  does not help his symptoms.  Recommend patient to  follow up with pulmonology Dr. Theotis.

## 2023-08-11 NOTE — Telephone Encounter (Signed)
 Called group home, [facility], and spoke to [place]. Informed him of medication recommendation. He verbalized understanding.

## 2023-08-11 NOTE — Assessment & Plan Note (Signed)
Encourage his cessation effort. - stopped in early Sept.

## 2023-08-11 NOTE — Patient Instructions (Signed)
 Dr. Babara requested patient to see Dr. Theotis for COPD exacerbation. Appointment has been made:   [facility] clinic pulmonology with Dr. Theotis Friday 08/18/2023 @ 10:45.   Please contact Dr. Tyna office at [phone] if the date or time does not work

## 2023-08-11 NOTE — Assessment & Plan Note (Signed)
 potassium chloride daily x 5 days.

## 2023-08-11 NOTE — Assessment & Plan Note (Addendum)
 Recommend Elqiuis 10mg  BID x 7 days followed by Eliquis 5mg  BID. Likely provoked due to recent admission.  He was on Aspirin 81mg  daily and Plavix 75mg  daily.  I recommend him to stop Aspirin.

## 2023-08-11 NOTE — Progress Notes (Signed)
 Hematology/Oncology Progress note Telephone:([phone] Fax:([phone]      CHIEF COMPLAINTS/PURPOSE OF CONSULTATION:  Stage III Lung cancer  ASSESSMENT & PLAN:   Cancer Staging  Adenocarcinoma of left lung [name] Staging form: Lung, AJCC 8th Edition - Clinical stage from 03/22/2023: cT1, cN3, cM0 - Signed by Babara Call, MD on 03/22/2023   Adenocarcinoma of left lung (HCC) Clinically he has Stage III left lung cancer He has right paratracheal lymph node hypermetabolic activity, although Station 7 lymph node showed atypical cell, no definite malignancy.  Left upper lobe adenocarcinoma, No enough tissue for NGS, will send off liquid biopsy- send off liquid biopsy.  Labs are reviewed and discussed with patient. S/p concurrent chemotherapy radiation. -carboplatin  AUC 2 and taxol  45mg /m2 today.  CT showed stable disease, incidental PE  Hold Durvalumab  due to acute COPD exacerbation.    COPD (chronic obstructive pulmonary disease) (HCC) Acute COPD exacerbation.  Continue albuterol  inhaler PRN, Duonebs.  Poor air entry bilaterally.  I recommend patient to fo ER for management. He declined.  I prescribed him tapering course of prednisone . He knows to go to ER if prednisone  does not help his symptoms.  Recommend patient to  follow up with pulmonology Dr. Theotis.   Pulmonary embolism (HCC) Recommend Elqiuis 10mg  BID x 7 days followed by Eliquis  5mg  BID. Likely provoked due to recent admission.  He was on Aspirin  81mg  daily and Plavix  75mg  daily.  I recommend him to stop Aspirin .    Tobacco use Encourage his cessation effort. - stopped in early Sept.   Hypokalemia  potassium chloride  20meq daily x 5 days.    No orders of the defined types were placed in this encounter.  Follow up  2 weeks Lab MD Durvalumab  All questions were answered. The patient knows to call the clinic with any problems, questions or concerns.  Call Babara, MD, PhD [facility] Hematology  Oncology 08/11/2023    HISTORY OF PRESENTING ILLNESS:  [name] 63 y.o. male presents to establish care for lung mass I have reviewed his chart and materials related to his cancer extensively and collaborated history with the patient. Summary of oncologic history is as follows:  12/08/2021 CT chest with contrast showed irregular solid pulmonary nodule of right upper lobe, increased in size.  Bibasilar consolidation.  Mildly enlarged right lower paratracheal lymph node.  Atherosclerosis. 01/04/2022 PET scan showed 10 mm irregular nodule in the lateral right upper lobe, suspicious for primary bronchogenic neoplasm. 2 small right paratracheal nodes, indeterminate and favored to be reactive.  Attention on follow-up.  Patient was seen by radiation oncology Dr. Lenn.  Patient was offered SBRT to the right upper lobe nodule/presumed non-small cell lung cancer stage I.  Patient lives in a group home.  He reports chronic cough.  Some shortness of breath with exertion. He is a current everyday smoker.  Few cigarettes per day.  He denies any unintentional weight loss, night sweats, fever or chills. Patient is on aspirin  and Plavix    INTERVAL HISTORY [name] is a 63 y.o. male who has above history reviewed by me today presents for follow up visit for lung cancer.  Oncology History  Adenocarcinoma of left lung (HCC)  12/29/2022 Imaging   CT chest with contrast showed Stable radiation changes involving the right upper lobe with a small residual treated nodule.  Decrease in size.  Interval enlargement of the left upper lobe pulmonary nodule.  Stable mediastinal nodes stable advanced emphysematous changes and areas of pulmonary scarring.  Stable bilateral adrenal gland adenomas.  Emphysema   01/30/2023 Imaging   PET 1. Recurrent bronchogenic carcinoma in the left upper lobe with new and increasingly hypermetabolic mediastinal lymph nodes. No evidence of distant metastatic disease. 2. Possible  mild prostate hypermetabolism. Consider laboratory correlation. 3. Liver is mildly heterogeneous, raising suspicion for steatosis. 4. Cholelithiasis. 5. Right adrenal adenoma.  Left adrenal myelolipoma. 6. Aortic atherosclerosis (ICD10-I70.0). Coronary artery calcification. 7. Enlarged pulmonic trunk, indicative of pulmonary arterial hypertension.   03/15/2023 Initial Diagnosis   Adenocarcinoma of left lung   Bronchoscopy showed  Lung, biopsy, Left upper lobe - NON-SMALL CELL CARCINOMA, FAVOR ADENOCARCINOMA.  1. Bronchial Lavage, Left upper lobe - POSITIVE FOR MALIGNANCY. - NON-SMALL CELL CARCINOMA, FAVOR ADENOCARCINOMA. - SEE NOTE. 2. Bronchial Brushing, Left upper lobe - POSITIVE FOR MALIGNANCY. - NON-SMALL CELL CARCINOMA, FAVOR ADENOCARCINOMA. 3. Bronchus, biopsy, left upper lobe - SEE [id number] 4. Fine Needle Aspiration, left upper lobe - POSITIVE FOR MALIGNANCY. - NON-SMALL CELL CARCINOMA, FAVOR ADENOCARCINOMA. 5. Fine Needle Aspiration, station 7 lymph node - ATYPICAL. - BACKGROUND LYMPHOID TISSUE IS PRESENT CONSISTENT WITH LYMPH NODE SAMPLING.    03/15/2023 Procedure   Fiberoptic bronchoscopy with airway inspection and BAL Procedure findings:   Bronchoscope was inserted via ETT  without difficulty.  Posterior oropharynx, epiglottis, arytenoids, false cords and vocal cords were not visualized as these were bypassed by endotracheal tube. The distal trachea was normal in circumference and appearance without mucosal, cartilaginous or branching abnormalities.  The main carina was mildly splayed . All right and left lobar airways were visualized to the Subsegmental level.  Sub- sub segmental carinae were identified in all the distal airways.   Secretions were visible in the following airways and appeared to be clear.  The mucosa was : friable at LEFT UPPER LOBE   Airways were notable for:        exophytic lesions :n       extrinsic compression in the following  distributions: n.       Friable mucosa: y       Teacher, music /pigmentation: n   MUCUS PLUGGING WAS SEVERE ON LEFT SIDE AND COMPLETE OBSTRUCTED MULTIPLE AIRWAYS.  UNABLE TO NAVIGATE THROUGH TRACHEOBRONCHIAL TREE DUE TO SEVERE MUCUS PLUGGING. THERAPEUTIC ASPIRATION WAS PERFORMED X 7 AND WAS DONE BILATERALLY ALTHOUGH WORSE ON LEFT.  BAL WAS PERFORMED AT LEFT UPPER LOBE X 2.     Endobronchial ultrasound assisted hilar and mediastinal lymph node biopsies procedure findings: The fiberoptic bronchoscope was removed and the EBUS scope was introduced. Examination began to evaluate for pathologically enlarged lymph nodes starting on the RIGHT  side progressing to the LEFT side.  All lymph node biopsies performed with 21G  needle. Lymph node biopsies were sent in cytolite for all stations.   STATION 10R - 6mm not biopsied STATION 7 - 1.2cm biopsied 3 times STATION 10L - 5mm not biopsied  STATION 4L - 6mm not biopsied    03/22/2023 Cancer Staging   Staging form: Lung, AJCC 8th Edition - Clinical stage from 03/22/2023: cT1, cN3, cM0 - Signed by Babara Call, MD on 03/22/2023 Stage prefix: Initial diagnosis   04/20/2023 - 06/08/2023 Chemotherapy   Patient is on Treatment Plan : LUNG Carboplatin  + Paclitaxel  + XRT q7d     08/25/2023 -  Chemotherapy   Patient is on Treatment Plan : LUNG Durvalumab  (10) q14d        Patient presents for evaluation prior to chemotherapy. He reports feel well He was recently hospitalized due to  pneumonia, and COPD exacerbation.  Also recent inpatient psyc admission for depression.  Today he reports more shortness of breath.  Breathing is worse than his chonic SOB and cough, for 2 days No fever or chills.  He reports being compliant with Eliquis .  No bleeding.     MEDICAL HISTORY:  Past Medical History:  Diagnosis Date   Acute on chronic respiratory failure with hypoxia (HCC)    Asthma    Coagulation disorder (HCC)    COPD (chronic obstructive pulmonary  disease) (HCC)    Depression    History of hiatal hernia    Hypertension    Hypokalemia    Leukocytosis    Lung mass    Normocytic anemia 07/13/2023   Pneumonia    Schizophrenia (HCC)    Shortness of breath dyspnea    Stroke [facility])    Umbilical hernia    Ventral hernia     SURGICAL HISTORY: Past Surgical History:  Procedure Laterality Date   COLONOSCOPY WITH PROPOFOL  N/A 09/21/2021   Procedure: COLONOSCOPY WITH PROPOFOL ;  Surgeon: Maryruth Ole DASEN, MD;  Location: [facility] ENDOSCOPY;  Service: Endoscopy;  Laterality: N/A;   HERNIA REPAIR     INSERTION OF MESH N/A 12/18/2014   Procedure: INSERTION OF MESH;  Surgeon: Charlie FORBES Fell, MD;  Location: [facility] ORS;  Service: General;  Laterality: N/A;   SUPRA-UMBILICAL HERNIA  12/18/2014   Procedure: SUPRA-UMBILICAL HERNIA;  Surgeon: Charlie FORBES Fell, MD;  Location: [facility] ORS;  Service: General;;   UMBILICAL HERNIA REPAIR N/A 12/18/2014   Procedure: HERNIA REPAIR UMBILICAL ADULT;  Surgeon: Charlie FORBES Fell, MD;  Location: [facility] ORS;  Service: General;  Laterality: N/A;   VIDEO BRONCHOSCOPY WITH ENDOBRONCHIAL ULTRASOUND N/A 03/15/2023   Procedure: VIDEO BRONCHOSCOPY WITH ENDOBRONCHIAL ULTRASOUND;  Surgeon: Parris Manna, MD;  Location: [facility] ORS;  Service: Thoracic;  Laterality: N/A;    SOCIAL HISTORY: Social History   Socioeconomic History   Marital status: Widowed    Spouse name: Not on file   Number of children: Not on file   Years of education: Not on file   Highest education level: Not on file  Occupational History   Not on file  Tobacco Use   Smoking status: Every Day    Current packs/day: 0.15    Average packs/day: 0.2 packs/day for 45.0 years (6.8 ttl pk-yrs)    Types: Cigarettes   Smokeless tobacco: Never  Vaping Use   Vaping status: Never Used  Substance and Sexual Activity   Alcohol use: Not Currently    Alcohol/week: 75.0 standard drinks of alcohol    Types: 75 Cans of beer per week   Drug use: Not Currently    Types:  Marijuana, Crack cocaine    Comment: none in 15 yrs   Sexual activity: Not Currently  Other Topics Concern   Not on file  Social History Narrative   Not on file   Social Drivers of Health   Financial Resource Strain: Not on file  Food Insecurity: No Food Insecurity (06/16/2023)   Hunger Vital Sign    Worried About Running Out of Food in the Last Year: Never true    Ran Out of Food in the Last Year: Never true  Transportation Needs: No Transportation Needs (06/16/2023)   PRAPARE - Administrator, Civil Service (Medical): No    Lack of Transportation (Non-Medical): No  Physical Activity: Not on file  Stress: Not on file  Social Connections: Not on file  Intimate Partner Violence: Not  At Risk (06/16/2023)   Humiliation, Afraid, Rape, and Kick questionnaire    Fear of Current or Ex-Partner: No    Emotionally Abused: No    Physically Abused: No    Sexually Abused: No    FAMILY HISTORY: Family History  Problem Relation Age of Onset   Asthma Mother    Hypertension Father     ALLERGIES:  has no known allergies.  MEDICATIONS:  Current Outpatient Medications  Medication Sig Dispense Refill   albuterol  (VENTOLIN  HFA) 108 (90 Base) MCG/ACT inhaler Inhale 1-2 puffs into the lungs every 6 (six) hours as needed for wheezing or shortness of breath. 8 g 0   APIXABAN  (ELIQUIS ) VTE STARTER PACK (10MG  AND 5MG ) Take as directed on package: start with two-5mg  tablets twice daily for 7 days. On day 8, switch to one-5mg  tablet twice daily. 74 each 0   clopidogrel  (PLAVIX ) 75 MG tablet Take 1 tablet (75 mg total) by mouth daily. 30 tablet 0   diltiazem  (CARDIZEM  CD) 240 MG 24 hr capsule Take 1 capsule (240 mg total) by mouth daily. 30 capsule 3   famotidine  (PEPCID ) 20 MG tablet Take 1 tablet (20 mg total) by mouth daily. 30 tablet 0   FLUoxetine  (PROZAC ) 20 MG capsule Take 1 capsule (20 mg total) by mouth daily. 30 capsule 3   Fluticasone -Umeclidin-Vilant (TRELEGY ELLIPTA )  100-62.5-25 MCG/ACT AEPB Inhale 1 puff into the lungs daily. 28 each 1   guaiFENesin  (MUCINEX ) 600 MG 12 hr tablet Take 1 tablet (600 mg total) by mouth 2 (two) times daily. 15 tablet 1   hydrOXYzine  (ATARAX ) 10 MG tablet Take 1 tablet (10 mg total) by mouth every 6 (six) hours as needed for anxiety. 30 tablet 0   ipratropium-albuterol  (DUONEB) 0.5-2.5 (3) MG/3ML SOLN Take 3 mLs by nebulization 3 (three) times daily. 360 mL 1   mirtazapine  (REMERON ) 7.5 MG tablet Take 1 tablet (7.5 mg total) by mouth at bedtime. 30 tablet 0   montelukast  (SINGULAIR ) 10 MG tablet Take 1 tablet (10 mg total) by mouth at bedtime. 30 tablet 1   pravastatin  (PRAVACHOL ) 20 MG tablet Take 1 tablet (20 mg total) by mouth daily at 6 PM. 30 tablet 0   predniSONE  (STERAPRED UNI-PAK 21 TAB) 10 MG (21) TBPK tablet Day 1 take 6 tablets, Day 2 take 5 tablets, Day 3 take 4 tablets, Day 4 take 3 tablets, Day 5 take 2 tablets D6 take 1 tablet 1 each 0   QUEtiapine  (SEROQUEL ) 300 MG tablet Take 2 tablets (600 mg total) by mouth at bedtime. 60 tablet 0   senna-docusate (SENOKOT-S) 8.6-50 MG tablet Take 1 tablet by mouth daily after breakfast. 30 tablet 0   traZODone  (DESYREL ) 100 MG tablet Take 2 tablets (200 mg total) by mouth at bedtime as needed for sleep. 30 tablet 0   umeclidinium bromide  (INCRUSE ELLIPTA ) 62.5 MCG/ACT AEPB Inhale 1 puff into the lungs daily. 7 each 1   No current facility-administered medications for this visit.    Review of Systems  Constitutional:  Negative for appetite change, chills, fatigue, fever and unexpected weight change.  HENT:   Negative for hearing loss and voice change.   Eyes:  Negative for eye problems and icterus.  Respiratory:  Positive for cough and shortness of breath. Negative for chest tightness and hemoptysis.   Cardiovascular:  Negative for chest pain and leg swelling.  Gastrointestinal:  Negative for abdominal distention and abdominal pain.  Endocrine: Negative for hot flashes.   Genitourinary:  Negative for difficulty urinating, dysuria and frequency.   Musculoskeletal:  Negative for arthralgias.  Skin:  Negative for itching and rash.  Neurological:  Negative for light-headedness and numbness.  Hematological:  Negative for adenopathy. Does not bruise/bleed easily.  Psychiatric/Behavioral:  Negative for confusion.      PHYSICAL EXAMINATION: ECOG PERFORMANCE STATUS: 0 - Asymptomatic  Vitals:   08/11/23 0846  BP: (!) 132/98  Pulse: (!) 109  Resp: 18  Temp: (!) 96 F (35.6 C)  SpO2: 90%   Filed Weights   08/11/23 0846  Weight: 227 lb 12.8 oz (103.3 kg)    Physical Exam Constitutional:      General: He is not in acute distress.    Appearance: He is not diaphoretic.  HENT:     Head: Normocephalic and atraumatic.  Eyes:     General: No scleral icterus. Cardiovascular:     Rate and Rhythm: Normal rate and regular rhythm.  Pulmonary:     Effort: Pulmonary effort is normal. No respiratory distress.     Breath sounds: Wheezing present.     Comments: Decreased breath sound bilaterally, poor air entry Abdominal:     General: There is no distension.     Palpations: Abdomen is soft.     Tenderness: There is no abdominal tenderness.  Musculoskeletal:        General: Normal range of motion.     Cervical back: Normal range of motion and neck supple.  Skin:    General: Skin is warm and dry.     Findings: No erythema.  Neurological:     Mental Status: He is alert and oriented to person, place, and time. Mental status is at baseline.     Motor: No abnormal muscle tone.  Psychiatric:        Mood and Affect: Affect normal.     Comments: Flat      LABORATORY DATA:  I have reviewed the data as listed    Latest Ref Rng & Units 08/11/2023    8:23 AM 07/13/2023   10:05 AM 06/16/2023    5:13 AM  CBC  WBC 4.0 - 10.5 K/uL 4.9  5.7  5.3   Hemoglobin 13.0 - 17.0 g/dL 88.7  9.9  9.5   Hematocrit 39.0 - 52.0 % 36.2  31.5  28.9   Platelets 150 - 400 K/uL 213   341  154       Latest Ref Rng & Units 08/11/2023    8:23 AM 07/13/2023   10:06 AM 06/16/2023    5:13 AM  CMP  Glucose 70 - 99 mg/dL 855  887  899   BUN 8 - 23 mg/dL 5  6  14    Creatinine 0.61 - 1.24 mg/dL 9.27  9.31  9.43   Sodium 135 - 145 mmol/L 138  139  137   Potassium 3.5 - 5.1 mmol/L 3.0  3.4  3.8   Chloride 98 - 111 mmol/L 105  106  103   CO2 22 - 32 mmol/L 23  24  25    Calcium 8.9 - 10.3 mg/dL 9.3  8.9  9.0   Total Protein 6.5 - 8.1 g/dL 7.6  7.2    Total Bilirubin 0.0 - 1.2 mg/dL 0.7  0.3    Alkaline Phos 38 - 126 U/L 72  65    AST 15 - 41 U/L 24  17    ALT 0 - 44 U/L 14  13       RADIOGRAPHIC STUDIES:  I have personally reviewed the radiological images as listed and agreed with the findings in the report. CT Chest W Contrast Result Date: 07/12/2023 CLINICAL DATA:  Lung cancer.  Restaging. EXAM: CT CHEST WITH CONTRAST TECHNIQUE: Multidetector CT imaging of the chest was performed during intravenous contrast administration. RADIATION DOSE REDUCTION: This exam was performed according to the departmental dose-optimization program which includes automated exposure control, adjustment of the mA and/or kV according to patient size and/or use of iterative reconstruction technique. CONTRAST:  75mL OMNIPAQUE  IOHEXOL  300 MG/ML  SOLN COMPARISON:  05/25/2023 FINDINGS: Cardiovascular: Heart size appears normal. There is trace pericardial effusion, new from previous exam. Incidental pulmonary embolus identified with filling defects within segmental branches to the lingula and left lower lobe, image 83/2 and image 89/2. aortic atherosclerosis. Coronary artery calcifications. Mediastinum/Nodes: Thyroid  gland, trachea, and esophagus appear normal. No enlarged mediastinal or hilar lymph nodes. Lungs/Pleura: Emphysema. Left upper lobe spiculated nodule is again seen measuring 1.7 x 1.2 by 2.0 cm, 49/4. On the previous exam this measured 1.6 x 1.0 by 2.0 cm. Stable post treatment changes within the  periphery of the right upper lobe. Subsegmental atelectasis is noted within the lung bases. The previously noted new nodule within the right upper lobe measuring 4 mm has resolved since 05/25/2023. Upper Abdomen: Bilateral kidney cysts are again noted. The largest arises off the lateral cortex of the right kidney measuring 3.5 cm, image 172/2. Unchanged benign right adrenal nodule and left adrenal myelolipoma. No follow-up imaging recommended. Musculoskeletal: No acute or suspicious osseous findings. IMPRESSION: 1. Incidental pulmonary embolus identified with filling defects within segmental branches to the lingula and left lower lobe. 2. Stable appearance of left upper lobe spiculated nodule. 3. Resolution of previous 4 mm right upper lobe lung nodule. 4. Stable post treatment changes within the periphery of the right upper lobe. 5. Coronary artery calcifications. 6. Aortic Atherosclerosis (ICD10-I70.0) and Emphysema (ICD10-J43.9). Electronically Signed   By: Waddell Calk M.D.   On: 07/12/2023 13:47

## 2023-08-12 LAB — T4: T4, Total: 8 ug/dL (ref 4.5–12.0)

## 2023-08-14 ENCOUNTER — Other Ambulatory Visit: Payer: Self-pay | Admitting: Oncology

## 2023-08-16 ENCOUNTER — Encounter: Payer: Self-pay | Admitting: Oncology

## 2023-08-17 ENCOUNTER — Ambulatory Visit (INDEPENDENT_AMBULATORY_CARE_PROVIDER_SITE_OTHER): Payer: 59 | Admitting: Podiatry

## 2023-08-17 ENCOUNTER — Encounter: Payer: Self-pay | Admitting: Podiatry

## 2023-08-17 VITALS — Ht 73.0 in | Wt 227.8 lb

## 2023-08-17 DIAGNOSIS — M79674 Pain in right toe(s): Secondary | ICD-10-CM | POA: Diagnosis not present

## 2023-08-17 DIAGNOSIS — M79675 Pain in left toe(s): Secondary | ICD-10-CM | POA: Diagnosis not present

## 2023-08-17 DIAGNOSIS — B351 Tinea unguium: Secondary | ICD-10-CM | POA: Diagnosis not present

## 2023-08-17 DIAGNOSIS — D689 Coagulation defect, unspecified: Secondary | ICD-10-CM | POA: Diagnosis not present

## 2023-08-17 NOTE — Progress Notes (Signed)
  Subjective:  Patient ID: [name], male    DOB: November 25, 1960,  MRN: [id number]  63 y.o. male presents to clinic with  at risk foot care with h/o coagulation defect and painful elongated mycotic toenails 1-5 bilaterally which are tender when wearing enclosed shoe gear. Pain is relieved with periodic professional debridement.  Chief Complaint  Patient presents with   Nail Problem    Pt is here for RFC not a diabetic, PCP is Dr Lorel and PT is unsure of LOV.   New problem(s): None   PCP is Aycock, Ngwe A, MD.  No Known Allergies  Review of Systems: Negative except as noted in the HPI.   Objective:  [name] is a pleasant 63 y.o. male in NAD.SABRA AAO x 3.  Vascular Examination: Vascular status intact b/l with palpable pedal pulses. CFT immediate b/l. No edema. No pain with calf compression b/l. Skin temperature gradient WNL b/l. No cyanosis or clubbing noted b/l LE.  Neurological Examination: Sensation grossly intact b/l with 10 gram monofilament. Vibratory sensation intact b/l.   Dermatological Examination: Pedal skin with normal turgor, texture and tone b/l. Toenails 1-5 b/l thick, discolored, elongated with subungual debris and pain on dorsal palpation. No hyperkeratotic lesions noted b/l.   Musculoskeletal Examination: Normal muscle strength 5/5 to all lower extremity muscle groups bilaterally. No pain, crepitus or joint limitation noted with ROM b/l LE. No gross bony pedal deformities b/l. Patient ambulates independently without assistive aids.  Radiographs: None  Last A1c:      Latest Ref Rng & Units 06/17/2023    1:32 PM 03/26/2023    7:09 AM  Hemoglobin A1C  Hemoglobin-A1c 4.8 - 5.6 % 5.6  5.8      Assessment:   1. Pain due to onychomycosis of toenails of both feet   2. Coagulation disorder (HCC)     Plan:  -Caregiver/provider present with patient on today's visit. -Patient to continue soft, supportive shoe gear daily. -Mycotic toenails 1-5 bilaterally were  debrided in length and girth with sterile nail nippers and dremel without incident. -Patient/POA to call should there be question/concern in the interim.  Return in about 3 months (around 11/15/2023).  Delon LITTIE Merlin, DPM      [facility] LOCATION: [address] [place] [address]                   Office [phone]   [facility] LOCATION: [address] [place], [place] [address] Office [phone]

## 2023-08-18 ENCOUNTER — Other Ambulatory Visit
Admission: RE | Admit: 2023-08-18 | Discharge: 2023-08-18 | Disposition: A | Discharge status: Home / Self Care | Payer: 59 | Source: Ambulatory Visit | Attending: Specialist | Admitting: Specialist

## 2023-08-18 DIAGNOSIS — R0602 Shortness of breath: Secondary | ICD-10-CM | POA: Insufficient documentation

## 2023-08-18 DIAGNOSIS — I2782 Chronic pulmonary embolism: Secondary | ICD-10-CM | POA: Diagnosis present

## 2023-08-18 DIAGNOSIS — I2609 Other pulmonary embolism with acute cor pulmonale: Secondary | ICD-10-CM | POA: Insufficient documentation

## 2023-08-18 LAB — D-DIMER, QUANTITATIVE: D-Dimer, Quant: <0.27 ug{FEU}/mL (ref 0.00–0.50)

## 2023-08-24 ENCOUNTER — Other Ambulatory Visit: Payer: Self-pay

## 2023-08-24 ENCOUNTER — Encounter: Payer: Self-pay | Admitting: Oncology

## 2023-08-24 DIAGNOSIS — C3492 Malignant neoplasm of unspecified part of left bronchus or lung: Secondary | ICD-10-CM

## 2023-08-25 ENCOUNTER — Inpatient Hospital Stay (HOSPITAL_BASED_OUTPATIENT_CLINIC_OR_DEPARTMENT_OTHER): Payer: 59 | Admitting: Oncology

## 2023-08-25 ENCOUNTER — Ambulatory Visit: Payer: 59 | Admitting: Oncology

## 2023-08-25 ENCOUNTER — Inpatient Hospital Stay: Payer: 59

## 2023-08-25 ENCOUNTER — Other Ambulatory Visit: Payer: 59

## 2023-08-25 ENCOUNTER — Ambulatory Visit: Payer: 59

## 2023-08-25 ENCOUNTER — Other Ambulatory Visit: Payer: Self-pay

## 2023-08-25 ENCOUNTER — Encounter: Payer: Self-pay | Admitting: Oncology

## 2023-08-25 VITALS — BP 116/76 | HR 88 | Temp 97.6°F | Resp 18 | Wt 233.1 lb

## 2023-08-25 DIAGNOSIS — I2699 Other pulmonary embolism without acute cor pulmonale: Secondary | ICD-10-CM

## 2023-08-25 DIAGNOSIS — C3492 Malignant neoplasm of unspecified part of left bronchus or lung: Secondary | ICD-10-CM

## 2023-08-25 DIAGNOSIS — E876 Hypokalemia: Secondary | ICD-10-CM

## 2023-08-25 DIAGNOSIS — J449 Chronic obstructive pulmonary disease, unspecified: Secondary | ICD-10-CM

## 2023-08-25 DIAGNOSIS — R42 Dizziness and giddiness: Secondary | ICD-10-CM | POA: Insufficient documentation

## 2023-08-25 DIAGNOSIS — C3412 Malignant neoplasm of upper lobe, left bronchus or lung: Secondary | ICD-10-CM | POA: Diagnosis not present

## 2023-08-25 LAB — CBC WITH DIFFERENTIAL (CANCER CENTER ONLY)
Abs Immature Granulocytes: 0.07 10*3/uL (ref 0.00–0.07)
Basophils Absolute: 0 10*3/uL (ref 0.0–0.1)
Basophils Relative: 0 %
Eosinophils Absolute: 0 10*3/uL (ref 0.0–0.5)
Eosinophils Relative: 0 %
HCT: 33.8 % — ABNORMAL LOW (ref 39.0–52.0)
Hemoglobin: 10.6 g/dL — ABNORMAL LOW (ref 13.0–17.0)
Immature Granulocytes: 1 %
Lymphocytes Relative: 26 %
Lymphs Abs: 1.6 10*3/uL (ref 0.7–4.0)
MCH: 28.2 pg (ref 26.0–34.0)
MCHC: 31.4 g/dL (ref 30.0–36.0)
MCV: 89.9 fL (ref 80.0–100.0)
Monocytes Absolute: 0.5 10*3/uL (ref 0.1–1.0)
Monocytes Relative: 8 %
Neutro Abs: 4 10*3/uL (ref 1.7–7.7)
Neutrophils Relative %: 65 %
Platelet Count: 184 10*3/uL (ref 150–400)
RBC: 3.76 MIL/uL — ABNORMAL LOW (ref 4.22–5.81)
RDW: 14.9 % (ref 11.5–15.5)
WBC Count: 6.2 10*3/uL (ref 4.0–10.5)
nRBC: 0 % (ref 0.0–0.2)

## 2023-08-25 LAB — IRON AND TIBC
Iron: 71 ug/dL (ref 45–182)
Saturation Ratios: 20 % (ref 17.9–39.5)
TIBC: 350 ug/dL (ref 250–450)
UIBC: 279 ug/dL

## 2023-08-25 LAB — CMP (CANCER CENTER ONLY)
ALT: 9 U/L (ref 0–44)
AST: 13 U/L — ABNORMAL LOW (ref 15–41)
Albumin: 3.2 g/dL — ABNORMAL LOW (ref 3.5–5.0)
Alkaline Phosphatase: 49 U/L (ref 38–126)
Anion gap: 10 (ref 5–15)
BUN: 8 mg/dL (ref 8–23)
CO2: 24 mmol/L (ref 22–32)
Calcium: 9.1 mg/dL (ref 8.9–10.3)
Chloride: 105 mmol/L (ref 98–111)
Creatinine: 0.67 mg/dL (ref 0.61–1.24)
GFR, Estimated: >60 mL/min (ref >60)
Glucose, Bld: 125 mg/dL — ABNORMAL HIGH (ref 70–99)
Potassium: 3 mmol/L — ABNORMAL LOW (ref 3.5–5.1)
Sodium: 139 mmol/L (ref 135–145)
Total Bilirubin: 0.5 mg/dL (ref 0.0–1.2)
Total Protein: 6.2 g/dL — ABNORMAL LOW (ref 6.5–8.1)

## 2023-08-25 LAB — FERRITIN: Ferritin: 50 ng/mL (ref 24–336)

## 2023-08-25 LAB — RETIC PANEL
Immature Retic Fract: 11.4 % (ref 2.3–15.9)
RBC.: 3.72 MIL/uL — ABNORMAL LOW (ref 4.22–5.81)
Retic Count, Absolute: 56.2 10*3/uL (ref 19.0–186.0)
Retic Ct Pct: 1.5 % (ref 0.4–3.1)
Reticulocyte Hemoglobin: 29.9 pg (ref >27.9)

## 2023-08-25 MED ORDER — POTASSIUM CHLORIDE CRYS ER 10 MEQ PO TBCR: 10.0000 meq | EXTENDED_RELEASE_TABLET | Freq: Every day | ORAL | 1 refills | Status: DC

## 2023-08-25 NOTE — Progress Notes (Signed)
Hematology/Oncology Progress note Telephone:(336) [phone] Fax:(336) [phone]      CHIEF COMPLAINTS/PURPOSE OF CONSULTATION:  Stage III Lung cancer  ASSESSMENT & PLAN:   Cancer Staging  Adenocarcinoma of left lung [facility]) Staging form: Lung, AJCC 8th Edition - Clinical stage from 03/22/2023: cT1, cN3, cM0 - Signed by [name], MD on 03/22/2023   Adenocarcinoma of left lung (HCC) Clinically he has Stage III left lung cancer He has right paratracheal lymph node hypermetabolic activity, although Station 7 lymph node showed atypical cell, no definite malignancy.  Left upper lobe adenocarcinoma, No enough tissue for NGS, will send off liquid biopsy- send off liquid biopsy.  Labs are reviewed and discussed with patient. S/p concurrent chemotherapy radiation. -carboplatin AUC 2 and taxol 45mg /m2 today.  CT showed stable disease, incidental PE  Hold Durvalumab due to acute COPD exacerbation/antibiotics treatment. Marland Kitchen    COPD (chronic obstructive pulmonary disease) (HCC) Acute COPD exacerbation, improved.  Continue albuterol inhaler PRN, Duonebs.  follow up with pulmonology Dr. Meredeth Ide.  Finish course of doxycycline prescribed by pulmonology  Hypokalemia Chronically low, recommend  potassium chloride daily, Rx sent   Normocytic anemia Hb decreased. He is on Eliquis and Plavix.  Check iron tibc ferritin  Pulmonary embolism (HCC) Recommend Elqiuis 5mg  BID. Likely provoked due to recent admission.  He is on Plavix 75mg  daily.  I recommend him to stop Aspirin.    Lightheaded Symptoms resolved. Possibly due to poor hydration this morning.  Encourage patient to increase oral hydration.     Orders Placed This Encounter  Procedures   Ferritin    Standing Status:   Future    Number of Occurrences:   1    Expected Date:   08/25/2023    Expiration Date:   08/24/2024   Iron and TIBC    Standing Status:   Future    Number of Occurrences:   1    Expected Date:   08/25/2023     Expiration Date:   08/24/2024   Retic Panel    Standing Status:   Future    Number of Occurrences:   1    Expected Date:   08/25/2023    Expiration Date:   08/24/2024   Follow up  1 week Lab MD Durvalumab All questions were answered. The patient knows to call the clinic with any problems, questions or concerns.  [name], MD, PhD [facility] Hematology Oncology 08/25/2023    HISTORY OF PRESENTING ILLNESS:  [name] 63 y.o. male presents to establish care for lung mass I have reviewed his chart and materials related to his cancer extensively and collaborated history with the patient. Summary of oncologic history is as follows:  12/08/2021 CT chest with contrast showed irregular solid pulmonary nodule of right upper lobe, increased in size.  Bibasilar consolidation.  Mildly enlarged right lower paratracheal lymph node.  Atherosclerosis. 01/04/2022 PET scan showed 10 mm irregular nodule in the lateral right upper lobe, suspicious for primary bronchogenic neoplasm. 2 small right paratracheal nodes, indeterminate and favored to be reactive.  Attention on follow-up.  Patient was seen by radiation oncology Dr. Rushie Chestnut.  Patient was offered SBRT to the right upper lobe nodule/presumed non-small cell lung cancer stage I.  Patient lives in a group home.  He reports chronic cough.  Some shortness of breath with exertion. He is a current everyday smoker.  Few cigarettes per day.  He denies any unintentional weight loss, night sweats, fever or chills. Patient is on aspirin and  Plavix   INTERVAL HISTORY [name] is a 63 y.o. male who has above history reviewed by me today presents for follow up visit for lung cancer.  Oncology History  Adenocarcinoma of left lung (HCC)  12/29/2022 Imaging   CT chest with contrast showed Stable radiation changes involving the right upper lobe with a small residual treated nodule.  Decrease in size.  Interval enlargement of the left upper lobe pulmonary nodule.   Stable mediastinal nodes stable advanced emphysematous changes and areas of pulmonary scarring.  Stable bilateral adrenal gland adenomas.  Emphysema   01/30/2023 Imaging   PET 1. Recurrent bronchogenic carcinoma in the left upper lobe with new and increasingly hypermetabolic mediastinal lymph nodes. No evidence of distant metastatic disease. 2. Possible mild prostate hypermetabolism. Consider laboratory correlation. 3. Liver is mildly heterogeneous, raising suspicion for steatosis. 4. Cholelithiasis. 5. Right adrenal adenoma.  Left adrenal myelolipoma. 6. Aortic atherosclerosis (ICD10-I70.0). Coronary artery calcification. 7. Enlarged pulmonic trunk, indicative of pulmonary arterial hypertension.   03/15/2023 Initial Diagnosis   Adenocarcinoma of left lung   Bronchoscopy showed  Lung, biopsy, Left upper lobe - NON-SMALL CELL CARCINOMA, FAVOR ADENOCARCINOMA.  1. Bronchial Lavage, Left upper lobe - POSITIVE FOR MALIGNANCY. - NON-SMALL CELL CARCINOMA, FAVOR ADENOCARCINOMA. - SEE NOTE. 2. Bronchial Brushing, Left upper lobe - POSITIVE FOR MALIGNANCY. - NON-SMALL CELL CARCINOMA, FAVOR ADENOCARCINOMA. 3. Bronchus, biopsy, left upper lobe - SEE [id number] 4. Fine Needle Aspiration, left upper lobe - POSITIVE FOR MALIGNANCY. - NON-SMALL CELL CARCINOMA, FAVOR ADENOCARCINOMA. 5. Fine Needle Aspiration, station 7 lymph node - ATYPICAL. - BACKGROUND LYMPHOID TISSUE IS PRESENT CONSISTENT WITH LYMPH NODE SAMPLING.    03/15/2023 Procedure   Fiberoptic bronchoscopy with airway inspection and BAL Procedure findings:   Bronchoscope was inserted via ETT  without difficulty.  Posterior oropharynx, epiglottis, arytenoids, false cords and vocal cords were not visualized as these were bypassed by endotracheal tube. The distal trachea was normal in circumference and appearance without mucosal, cartilaginous or branching abnormalities.  The main carina was mildly splayed . All right and left lobar  airways were visualized to the Subsegmental level.  Sub- sub segmental carinae were identified in all the distal airways.   Secretions were visible in the following airways and appeared to be clear.  The mucosa was : friable at LEFT UPPER LOBE   Airways were notable for:        exophytic lesions :n       extrinsic compression in the following distributions: n.       Friable mucosa: y       Teacher, music /pigmentation: n   MUCUS PLUGGING WAS SEVERE ON LEFT SIDE AND COMPLETE OBSTRUCTED MULTIPLE AIRWAYS.  UNABLE TO NAVIGATE THROUGH TRACHEOBRONCHIAL TREE DUE TO SEVERE MUCUS PLUGGING. THERAPEUTIC ASPIRATION WAS PERFORMED X 7 AND WAS DONE BILATERALLY ALTHOUGH WORSE ON LEFT.  BAL WAS PERFORMED AT LEFT UPPER LOBE X 2.     Endobronchial ultrasound assisted hilar and mediastinal lymph node biopsies procedure findings: The fiberoptic bronchoscope was removed and the EBUS scope was introduced. Examination began to evaluate for pathologically enlarged lymph nodes starting on the RIGHT  side progressing to the LEFT side.  All lymph node biopsies performed with 21G  needle. Lymph node biopsies were sent in cytolite for all stations.   STATION 10R - 6mm not biopsied STATION 7 - 1.2cm biopsied 3 times STATION 10L - 5mm not biopsied  STATION 4L - 6mm not biopsied    03/22/2023 Cancer Staging   Staging  form: Lung, AJCC 8th Edition - Clinical stage from 03/22/2023: cT1, cN3, cM0 - Signed by [name], MD on 03/22/2023 Stage prefix: Initial diagnosis   04/20/2023 - 06/08/2023 Chemotherapy   Patient is on Treatment Plan : LUNG Carboplatin + Paclitaxel + XRT q7d     09/01/2023 -  Chemotherapy   Patient is on Treatment Plan : LUNG Durvalumab (10) q14d        Patient presents for evaluation prior to chemotherapy. He reports feel well He was recently hospitalized due to pneumonia, and COPD exacerbation.  Also recent inpatient psyc admission for depression.  Today he reports  shortness of breath has  improved after tapering course of steroid.  chronic SOB, was seen by pulmonology and prescribed 10 days course of Doxycycline.  He reports being compliant with Eliquis.  No bleeding events, dark stool.  He almost had a fall while walking into exam room. + lightheaded, resolved now.      MEDICAL HISTORY:  Past Medical History:  Diagnosis Date   Acute on chronic respiratory failure with hypoxia (HCC)    Asthma    Coagulation disorder (HCC)    COPD (chronic obstructive pulmonary disease) (HCC)    Depression    History of hiatal hernia    Hypertension    Hypokalemia    Leukocytosis    Lung mass    Normocytic anemia 07/13/2023   Pneumonia    Schizophrenia (HCC)    Shortness of breath dyspnea    Stroke [facility])    Umbilical hernia    Ventral hernia     SURGICAL HISTORY: Past Surgical History:  Procedure Laterality Date   COLONOSCOPY WITH PROPOFOL N/A 09/21/2021   Procedure: COLONOSCOPY WITH PROPOFOL;  Surgeon: Regis Bill, MD;  Location: [facility] ENDOSCOPY;  Service: Endoscopy;  Laterality: N/A;   HERNIA REPAIR     INSERTION OF MESH N/A 12/18/2014   Procedure: INSERTION OF MESH;  Surgeon: Lattie Haw, MD;  Location: [facility] ORS;  Service: General;  Laterality: N/A;   SUPRA-UMBILICAL HERNIA  12/18/2014   Procedure: SUPRA-UMBILICAL HERNIA;  Surgeon: Lattie Haw, MD;  Location: [facility] ORS;  Service: General;;   UMBILICAL HERNIA REPAIR N/A 12/18/2014   Procedure: HERNIA REPAIR UMBILICAL ADULT;  Surgeon: Lattie Haw, MD;  Location: [facility] ORS;  Service: General;  Laterality: N/A;   VIDEO BRONCHOSCOPY WITH ENDOBRONCHIAL ULTRASOUND N/A 03/15/2023   Procedure: VIDEO BRONCHOSCOPY WITH ENDOBRONCHIAL ULTRASOUND;  Surgeon: Vida Rigger, MD;  Location: [facility] ORS;  Service: Thoracic;  Laterality: N/A;    SOCIAL HISTORY: Social History   Socioeconomic History   Marital status: Widowed    Spouse name: Not on file   Number of children: Not on file   Years of education: Not on file    Highest education level: Not on file  Occupational History   Not on file  Tobacco Use   Smoking status: Every Day    Current packs/day: 0.15    Average packs/day: 0.2 packs/day for 45.0 years (6.8 ttl pk-yrs)    Types: Cigarettes   Smokeless tobacco: Never  Vaping Use   Vaping status: Never Used  Substance and Sexual Activity   Alcohol use: Not Currently    Alcohol/week: 75.0 standard drinks of alcohol    Types: 75 Cans of beer per week   Drug use: Not Currently    Types: Marijuana, "Crack" cocaine    Comment: none in 15 yrs   Sexual activity: Not Currently  Other Topics Concern   Not on file  Social History Narrative   Not on file   Social Drivers of Health   Financial Resource Strain: Medium Risk (08/18/2023)   Received from [facility]   Overall Financial Resource Strain (CARDIA)    Difficulty of Paying Living Expenses: Somewhat hard  Food Insecurity: Patient Unable To Answer (08/18/2023)   [facility]   Hunger Vital Sign    Worried About Running Out of Food in the Last Year: Patient unable to answer    Ran Out of Food in the Last Year: Patient unable to answer  Transportation Needs: Patient Unable To Answer (08/18/2023)   Received from [facility] - Transportation    In the past 12 months, has lack of transportation kept you from medical appointments or from getting medications?: Patient unable to answer    Lack of Transportation (Non-Medical): Patient unable to answer  Physical Activity: Not on file  Stress: Not on file  Social Connections: Not on file  Intimate Partner Violence: Not At Risk (06/16/2023)   Humiliation, Afraid, Rape, and Kick questionnaire    Fear of Current or Ex-Partner: No    Emotionally Abused: No    Physically Abused: No    Sexually Abused: No    FAMILY HISTORY: Family History  Problem Relation Age of Onset   Asthma Mother    Hypertension Father      ALLERGIES:  has no known allergies.  MEDICATIONS:  Current Outpatient Medications  Medication Sig Dispense Refill   albuterol (VENTOLIN HFA) 108 (90 Base) MCG/ACT inhaler Inhale 1-2 puffs into the lungs every 6 (six) hours as needed for wheezing or shortness of breath. 8 g 0   apixaban (ELIQUIS) 5 MG TABS tablet Take 1 tablet (5 mg total) by mouth 2 (two) times daily. 60 tablet 3   APIXABAN (ELIQUIS) VTE STARTER PACK (10MG  AND 5MG ) Take as directed on package: start with two-5mg  tablets twice daily for 7 days. On day 8, switch to one-5mg  tablet twice daily. 74 each 0   clopidogrel (PLAVIX) 75 MG tablet Take 1 tablet (75 mg total) by mouth daily. 30 tablet 0   diltiazem (CARDIZEM CD) 240 MG 24 hr capsule Take 1 capsule (240 mg total) by mouth daily. 30 capsule 3   famotidine (PEPCID) 20 MG tablet Take 1 tablet (20 mg total) by mouth daily. 30 tablet 0   FLUoxetine (PROZAC) 20 MG capsule Take 1 capsule (20 mg total) by mouth daily. 30 capsule 3   Fluticasone-Umeclidin-Vilant (TRELEGY ELLIPTA) 100-62.5-25 MCG/ACT AEPB Inhale 1 puff into the lungs daily. 28 each 1   guaiFENesin (MUCINEX) 600 MG 12 hr tablet Take 1 tablet (600 mg total) by mouth 2 (two) times daily. 15 tablet 1   hydrOXYzine (ATARAX) 10 MG tablet Take 1 tablet (10 mg total) by mouth every 6 (six) hours as needed for anxiety. 30 tablet 0   ipratropium-albuterol (DUONEB) 0.5-2.5 (3) MG/3ML SOLN Take 3 mLs by nebulization 3 (three) times daily. 360 mL 1   mirtazapine (REMERON) 7.5 MG tablet Take 1 tablet (7.5 mg total) by mouth at bedtime. 30 tablet 0   montelukast (SINGULAIR) 10 MG tablet Take 1 tablet (10 mg total) by mouth at bedtime. 30 tablet 1   potassium chloride (KLOR-CON M) 10 MEQ tablet Take 1 tablet (10 mEq total) by mouth daily. 30 tablet 1   pravastatin (PRAVACHOL) 20 MG tablet Take 1 tablet (20 mg total) by mouth daily at 6 PM. 30 tablet  0   predniSONE (STERAPRED UNI-PAK 21 TAB) 10 MG (21) TBPK tablet Day 1 take 6  tablets, Day 2 take 5 tablets, Day 3 take 4 tablets, Day 4 take 3 tablets, Day 5 take 2 tablets D6 take 1 tablet 1 each 0   QUEtiapine (SEROQUEL) 300 MG tablet Take 2 tablets (600 mg total) by mouth at bedtime. 60 tablet 0   senna-docusate (SENOKOT-S) 8.6-50 MG tablet Take 1 tablet by mouth daily after breakfast. 30 tablet 0   traZODone (DESYREL) 100 MG tablet Take 2 tablets (200 mg total) by mouth at bedtime as needed for sleep. 30 tablet 0   umeclidinium bromide (INCRUSE ELLIPTA) 62.5 MCG/ACT AEPB Inhale 1 puff into the lungs daily. 7 each 1   No current facility-administered medications for this visit.    Review of Systems  Constitutional:  Negative for appetite change, chills, fatigue, fever and unexpected weight change.  HENT:   Negative for hearing loss and voice change.   Eyes:  Negative for eye problems and icterus.  Respiratory:  Positive for shortness of breath. Negative for chest tightness, cough and hemoptysis.   Cardiovascular:  Negative for chest pain and leg swelling.  Gastrointestinal:  Negative for abdominal distention and abdominal pain.  Endocrine: Negative for hot flashes.  Genitourinary:  Negative for difficulty urinating, dysuria and frequency.   Musculoskeletal:  Negative for arthralgias.  Skin:  Negative for itching and rash.  Neurological:  Positive for light-headedness. Negative for numbness.  Hematological:  Negative for adenopathy. Does not bruise/bleed easily.  Psychiatric/Behavioral:  Negative for confusion.      PHYSICAL EXAMINATION: ECOG PERFORMANCE STATUS: 0 - Asymptomatic  Vitals:   08/25/23 0848  BP: 116/76  Pulse: 88  Resp: 18  Temp: 97.6 F (36.4 C)  SpO2: 95%   Filed Weights   08/25/23 0848  Weight: 233 lb 1.6 oz (105.7 kg)    Physical Exam Constitutional:      General: He is not in acute distress.    Appearance: He is not diaphoretic.  HENT:     Head: Normocephalic and atraumatic.  Eyes:     General: No scleral  icterus. Cardiovascular:     Rate and Rhythm: Normal rate and regular rhythm.  Pulmonary:     Effort: Pulmonary effort is normal. No respiratory distress.     Breath sounds: Wheezing present.     Comments: Decreased breath sound bilaterally, poor air entry Abdominal:     General: There is no distension.     Palpations: Abdomen is soft.     Tenderness: There is no abdominal tenderness.  Musculoskeletal:        General: Normal range of motion.     Cervical back: Normal range of motion and neck supple.  Skin:    General: Skin is warm and dry.     Findings: No erythema.  Neurological:     Mental Status: He is alert and oriented to person, place, and time. Mental status is at baseline.     Motor: No abnormal muscle tone.  Psychiatric:        Mood and Affect: Affect normal.     Comments: Flat      LABORATORY DATA:  I have reviewed the data as listed    Latest Ref Rng & Units 08/25/2023    7:52 AM 08/11/2023    8:23 AM 07/13/2023   10:05 AM  CBC  WBC 4.0 - 10.5 K/uL 6.2  4.9  5.7   Hemoglobin 13.0 - 17.0  g/dL 60.4  54.0  9.9   Hematocrit 39.0 - 52.0 % 33.8  36.2  31.5   Platelets 150 - 400 K/uL 184  213  341       Latest Ref Rng & Units 08/25/2023    7:52 AM 08/11/2023    8:23 AM 07/13/2023   10:06 AM  CMP  Glucose 70 - 99 mg/dL 981  191  478   BUN 8 - 23 mg/dL 8  <5  6   Creatinine 2.95 - 1.24 mg/dL 6.21  3.08  6.57   Sodium 135 - 145 mmol/L 139  138  139   Potassium 3.5 - 5.1 mmol/L 3.0  3.0  3.4   Chloride 98 - 111 mmol/L 105  105  106   CO2 22 - 32 mmol/L 24  23  24    Calcium 8.9 - 10.3 mg/dL 9.1  9.3  8.9   Total Protein 6.5 - 8.1 g/dL 6.2  7.6  7.2   Total Bilirubin 0.0 - 1.2 mg/dL 0.5  0.7  0.3   Alkaline Phos 38 - 126 U/L 49  72  65   AST 15 - 41 U/L 13  24  17    ALT 0 - 44 U/L 9  14  13       RADIOGRAPHIC STUDIES: I have personally reviewed the radiological images as listed and agreed with the findings in the report. No results found.

## 2023-08-25 NOTE — Assessment & Plan Note (Signed)
Recommend Elqiuis 5mg  BID. Likely provoked due to recent admission.  He is on Plavix 75mg  daily.  I recommend him to stop Aspirin.

## 2023-08-25 NOTE — Progress Notes (Signed)
Pt here for follow up. Pt was walking into exam room and says his "legs gave out." His legs got weak, but did not fall. Pt given wheelchair.

## 2023-08-25 NOTE — Assessment & Plan Note (Signed)
Symptoms resolved. Possibly due to poor hydration this morning.  Encourage patient to increase oral hydration.

## 2023-08-25 NOTE — Assessment & Plan Note (Signed)
Acute COPD exacerbation, improved.  Continue albuterol inhaler PRN, Duonebs.  follow up with pulmonology Dr. Meredeth Ide.  Finish course of doxycycline prescribed by pulmonology

## 2023-08-25 NOTE — Assessment & Plan Note (Addendum)
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  Left upper lobe adenocarcinoma, No enough tissue for NGS, will send off liquid biopsy- send off liquid biopsy.  Labs are reviewed and discussed with patient. S/p concurrent chemotherapy radiation. -carboplatin AUC 2 and taxol 45mg /m2 today.  CT showed stable disease, incidental PE  Hold Durvalumab due to acute COPD exacerbation/antibiotics treatment. Marland Kitchen

## 2023-08-25 NOTE — Assessment & Plan Note (Signed)
Chronically low, recommend  potassium chloride daily, Rx sent

## 2023-08-25 NOTE — Assessment & Plan Note (Signed)
Hb decreased. He is on Eliquis and Plavix.  Check iron tibc ferritin

## 2023-08-28 NOTE — Progress Notes (Signed)
Pharmacist Chemotherapy Monitoring - Initial Assessment    Anticipated start date: 09/01/23   The following has been reviewed per standard work regarding the patient's treatment regimen: The patient's diagnosis, treatment plan and drug doses, and organ/hematologic function Lab orders and baseline tests specific to treatment regimen  The treatment plan start date, drug sequencing, and pre-medications Prior authorization status  Patient's documented medication list, including drug-drug interaction screen and prescriptions for anti-emetics and supportive care specific to the treatment regimen The drug concentrations, fluid compatibility, administration routes, and timing of the medications to be used The patient's access for treatment and lifetime cumulative dose history, if applicable  The patient's medication allergies and previous infusion related reactions, if applicable   Changes made to treatment plan:  N/A  Follow up needed:  N/A   Sharen Hones, [facility], 08/28/2023  1:36 PM

## 2023-08-31 ENCOUNTER — Other Ambulatory Visit: Payer: Self-pay

## 2023-08-31 DIAGNOSIS — C3492 Malignant neoplasm of unspecified part of left bronchus or lung: Secondary | ICD-10-CM

## 2023-08-31 NOTE — Assessment & Plan Note (Addendum)
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  Left upper lobe adenocarcinoma, No enough tissue for NGS, will send off liquid biopsy- send off liquid biopsy.  Labs are reviewed and discussed with patient. S/p concurrent chemotherapy radiation. -carboplatin AUC 2 and taxol 45mg /m2 today.  CT showed stable disease, incidental PE  Clinically he is doing well today Proceed with Durvalumab today. Rationale and side effects were reviewed with patient.  He agrees with treatment.

## 2023-09-01 ENCOUNTER — Ambulatory Visit: Payer: 59 | Admitting: Oncology

## 2023-09-01 ENCOUNTER — Inpatient Hospital Stay: Payer: 59

## 2023-09-01 ENCOUNTER — Inpatient Hospital Stay (HOSPITAL_BASED_OUTPATIENT_CLINIC_OR_DEPARTMENT_OTHER): Payer: 59 | Admitting: Oncology

## 2023-09-01 ENCOUNTER — Other Ambulatory Visit: Payer: 59

## 2023-09-01 ENCOUNTER — Encounter: Payer: Self-pay | Admitting: Oncology

## 2023-09-01 VITALS — BP 155/93 | HR 95 | Temp 97.9°F | Resp 18 | Wt 233.3 lb

## 2023-09-01 DIAGNOSIS — J449 Chronic obstructive pulmonary disease, unspecified: Secondary | ICD-10-CM | POA: Diagnosis not present

## 2023-09-01 DIAGNOSIS — E876 Hypokalemia: Secondary | ICD-10-CM

## 2023-09-01 DIAGNOSIS — C3492 Malignant neoplasm of unspecified part of left bronchus or lung: Secondary | ICD-10-CM

## 2023-09-01 DIAGNOSIS — Z5112 Encounter for antineoplastic immunotherapy: Secondary | ICD-10-CM | POA: Insufficient documentation

## 2023-09-01 DIAGNOSIS — D649 Anemia, unspecified: Secondary | ICD-10-CM | POA: Diagnosis not present

## 2023-09-01 DIAGNOSIS — I2699 Other pulmonary embolism without acute cor pulmonale: Secondary | ICD-10-CM

## 2023-09-01 DIAGNOSIS — C3412 Malignant neoplasm of upper lobe, left bronchus or lung: Secondary | ICD-10-CM | POA: Diagnosis not present

## 2023-09-01 LAB — CBC WITH DIFFERENTIAL (CANCER CENTER ONLY)
Abs Immature Granulocytes: 0.02 10*3/uL (ref 0.00–0.07)
Basophils Absolute: 0 10*3/uL (ref 0.0–0.1)
Basophils Relative: 0 %
Eosinophils Absolute: 0.1 10*3/uL (ref 0.0–0.5)
Eosinophils Relative: 2 %
HCT: 36.3 % — ABNORMAL LOW (ref 39.0–52.0)
Hemoglobin: 11.2 g/dL — ABNORMAL LOW (ref 13.0–17.0)
Immature Granulocytes: 0 %
Lymphocytes Relative: 18 %
Lymphs Abs: 1 10*3/uL (ref 0.7–4.0)
MCH: 27.8 pg (ref 26.0–34.0)
MCHC: 30.9 g/dL (ref 30.0–36.0)
MCV: 90.1 fL (ref 80.0–100.0)
Monocytes Absolute: 0.3 10*3/uL (ref 0.1–1.0)
Monocytes Relative: 6 %
Neutro Abs: 4 10*3/uL (ref 1.7–7.7)
Neutrophils Relative %: 74 %
Platelet Count: 181 10*3/uL (ref 150–400)
RBC: 4.03 MIL/uL — ABNORMAL LOW (ref 4.22–5.81)
RDW: 15.3 % (ref 11.5–15.5)
WBC Count: 5.4 10*3/uL (ref 4.0–10.5)
nRBC: 0.4 % — ABNORMAL HIGH (ref 0.0–0.2)

## 2023-09-01 LAB — CMP (CANCER CENTER ONLY)
ALT: 9 U/L (ref 0–44)
AST: 22 U/L (ref 15–41)
Albumin: 3.4 g/dL — ABNORMAL LOW (ref 3.5–5.0)
Alkaline Phosphatase: 57 U/L (ref 38–126)
Anion gap: 9 (ref 5–15)
BUN: 6 mg/dL — ABNORMAL LOW (ref 8–23)
CO2: 27 mmol/L (ref 22–32)
Calcium: 9.2 mg/dL (ref 8.9–10.3)
Chloride: 104 mmol/L (ref 98–111)
Creatinine: 0.74 mg/dL (ref 0.61–1.24)
GFR, Estimated: >60 mL/min (ref >60)
Glucose, Bld: 151 mg/dL — ABNORMAL HIGH (ref 70–99)
Potassium: 3.5 mmol/L (ref 3.5–5.1)
Sodium: 140 mmol/L (ref 135–145)
Total Bilirubin: 0.5 mg/dL (ref 0.0–1.2)
Total Protein: 6.7 g/dL (ref 6.5–8.1)

## 2023-09-01 MED ORDER — SODIUM CHLORIDE 0.9 % IV SOLN
10.0000 mg/kg | Freq: Once | INTRAVENOUS | Status: AC
  Administered 2023-09-01: 1000 mg via INTRAVENOUS
  Filled 2023-09-01: qty 20

## 2023-09-01 MED ORDER — MONTELUKAST SODIUM 10 MG PO TABS: 10.0000 mg | ORAL_TABLET | Freq: Every day | ORAL | 1 refills | Status: AC

## 2023-09-01 MED ORDER — SODIUM CHLORIDE 0.9 % IV SOLN
INTRAVENOUS | Status: DC
  Filled 2023-09-01: qty 250

## 2023-09-01 MED ORDER — MIRTAZAPINE 7.5 MG PO TABS: 7.5000 mg | ORAL_TABLET | Freq: Every day | ORAL | 5 refills | Status: AC

## 2023-09-01 MED ORDER — GUAIFENESIN ER 600 MG PO TB12: 600.0000 mg | ORAL_TABLET | Freq: Two times a day (BID) | ORAL | 1 refills | Status: AC

## 2023-09-01 NOTE — Assessment & Plan Note (Signed)
Chronically low, improved. recommend  potassium chloride daily,

## 2023-09-01 NOTE — Assessment & Plan Note (Signed)
Hb has improved, iron panel showed adequate iron store.   Marland Kitchen

## 2023-09-01 NOTE — Assessment & Plan Note (Signed)
Immunotherapy plan as planned.

## 2023-09-01 NOTE — Progress Notes (Signed)
Hematology/Oncology Progress note Telephone:(336) [phone] Fax:(336) [phone]      CHIEF COMPLAINTS/PURPOSE OF CONSULTATION:  Stage III Lung cancer  ASSESSMENT & PLAN:   Cancer Staging  Adenocarcinoma of left lung [name]) Staging form: Lung, AJCC 8th Edition - Clinical stage from 03/22/2023: cT1, cN3, cM0 - Signed by [name], MD on 03/22/2023   Adenocarcinoma of left lung (HCC) Clinically he has Stage III left lung cancer He has right paratracheal lymph node hypermetabolic activity, although Station 7 lymph node showed atypical cell, no definite malignancy.  Left upper lobe adenocarcinoma, No enough tissue for NGS, will send off liquid biopsy- send off liquid biopsy.  Labs are reviewed and discussed with patient. S/p concurrent chemotherapy radiation. -carboplatin AUC 2 and taxol 45mg /m2 today.  CT showed stable disease, incidental PE  Clinically he is doing well today Proceed with Durvalumab today. Rationale and side effects were reviewed with patient.  He agrees with treatment.    COPD (chronic obstructive pulmonary disease) (HCC) Continue albuterol inhaler PRN, Duonebs.  follow up with pulmonology Dr. Meredeth Ide.  Continue Singulair, mucinex.   Hypokalemia Chronically low, improved. recommend  potassium chloride daily,   Normocytic anemia Hb has improved, iron panel showed adequate iron store.   .    Pulmonary embolism (HCC) Recommend Elqiuis 5mg  BID. Likely provoked due to recent admission.  He is on Plavix 75mg  daily. Stop aspirin      Encounter for antineoplastic immunotherapy Immunotherapy plan as planned.      Orders Placed This Encounter  Procedures   CBC with Differential (Cancer Center Only)    Standing Status:   Future    Expected Date:   10/06/2023    Expiration Date:   10/05/2024   CMP (Cancer Center only)    Standing Status:   Future    Expected Date:   10/06/2023    Expiration Date:   10/05/2024   T4    Standing Status:   Future    Expected  Date:   10/06/2023    Expiration Date:   10/05/2024   TSH    Standing Status:   Future    Expected Date:   10/06/2023    Expiration Date:   10/05/2024   CBC with Differential (Cancer Center Only)    Standing Status:   Future    Expected Date:   09/08/2023    Expiration Date:   08/31/2024   CMP (Cancer Center only)    Standing Status:   Future    Expected Date:   09/08/2023    Expiration Date:   08/31/2024   Follow up  1 week Lab MD All questions were answered. The patient knows to call the clinic with any problems, questions or concerns.  [name], MD, PhD [facility] Hematology Oncology 09/01/2023    HISTORY OF PRESENTING ILLNESS:  [name] 63 y.o. male presents to establish care for lung mass I have reviewed his chart and materials related to his cancer extensively and collaborated history with the patient. Summary of oncologic history is as follows:  12/08/2021 CT chest with contrast showed irregular solid pulmonary nodule of right upper lobe, increased in size.  Bibasilar consolidation.  Mildly enlarged right lower paratracheal lymph node.  Atherosclerosis. 01/04/2022 PET scan showed 10 mm irregular nodule in the lateral right upper lobe, suspicious for primary bronchogenic neoplasm. 2 small right paratracheal nodes, indeterminate and favored to be reactive.  Attention on follow-up.  Patient was seen by radiation oncology Dr. Rushie Chestnut.  Patient was  offered SBRT to the right upper lobe nodule/presumed non-small cell lung cancer stage I.  Patient lives in a group home.  He reports chronic cough.  Some shortness of breath with exertion. He is a current everyday smoker.  Few cigarettes per day.  He denies any unintentional weight loss, night sweats, fever or chills. Patient is on aspirin and Plavix   INTERVAL HISTORY [name] is a 63 y.o. male who has above history reviewed by me today presents for follow up visit for lung cancer.  Oncology History  Adenocarcinoma of left lung  (HCC)  12/29/2022 Imaging   CT chest with contrast showed Stable radiation changes involving the right upper lobe with a small residual treated nodule.  Decrease in size.  Interval enlargement of the left upper lobe pulmonary nodule.  Stable mediastinal nodes stable advanced emphysematous changes and areas of pulmonary scarring.  Stable bilateral adrenal gland adenomas.  Emphysema   01/30/2023 Imaging   PET 1. Recurrent bronchogenic carcinoma in the left upper lobe with new and increasingly hypermetabolic mediastinal lymph nodes. No evidence of distant metastatic disease. 2. Possible mild prostate hypermetabolism. Consider laboratory correlation. 3. Liver is mildly heterogeneous, raising suspicion for steatosis. 4. Cholelithiasis. 5. Right adrenal adenoma.  Left adrenal myelolipoma. 6. Aortic atherosclerosis (ICD10-I70.0). Coronary artery calcification. 7. Enlarged pulmonic trunk, indicative of pulmonary arterial hypertension.   03/15/2023 Initial Diagnosis   Adenocarcinoma of left lung   Bronchoscopy showed  Lung, biopsy, Left upper lobe - NON-SMALL CELL CARCINOMA, FAVOR ADENOCARCINOMA.  1. Bronchial Lavage, Left upper lobe - POSITIVE FOR MALIGNANCY. - NON-SMALL CELL CARCINOMA, FAVOR ADENOCARCINOMA. - SEE NOTE. 2. Bronchial Brushing, Left upper lobe - POSITIVE FOR MALIGNANCY. - NON-SMALL CELL CARCINOMA, FAVOR ADENOCARCINOMA. 3. Bronchus, biopsy, left upper lobe - SEE [id number] 4. Fine Needle Aspiration, left upper lobe - POSITIVE FOR MALIGNANCY. - NON-SMALL CELL CARCINOMA, FAVOR ADENOCARCINOMA. 5. Fine Needle Aspiration, station 7 lymph node - ATYPICAL. - BACKGROUND LYMPHOID TISSUE IS PRESENT CONSISTENT WITH LYMPH NODE SAMPLING.    03/15/2023 Procedure   Fiberoptic bronchoscopy with airway inspection and BAL Procedure findings:   Bronchoscope was inserted via ETT  without difficulty.  Posterior oropharynx, epiglottis, arytenoids, false cords and vocal cords were not  visualized as these were bypassed by endotracheal tube. The distal trachea was normal in circumference and appearance without mucosal, cartilaginous or branching abnormalities.  The main carina was mildly splayed . All right and left lobar airways were visualized to the Subsegmental level.  Sub- sub segmental carinae were identified in all the distal airways.   Secretions were visible in the following airways and appeared to be clear.  The mucosa was : friable at LEFT UPPER LOBE   Airways were notable for:        exophytic lesions :n       extrinsic compression in the following distributions: n.       Friable mucosa: y       Teacher, music /pigmentation: n   MUCUS PLUGGING WAS SEVERE ON LEFT SIDE AND COMPLETE OBSTRUCTED MULTIPLE AIRWAYS.  UNABLE TO NAVIGATE THROUGH TRACHEOBRONCHIAL TREE DUE TO SEVERE MUCUS PLUGGING. THERAPEUTIC ASPIRATION WAS PERFORMED X 7 AND WAS DONE BILATERALLY ALTHOUGH WORSE ON LEFT.  BAL WAS PERFORMED AT LEFT UPPER LOBE X 2.     Endobronchial ultrasound assisted hilar and mediastinal lymph node biopsies procedure findings: The fiberoptic bronchoscope was removed and the EBUS scope was introduced. Examination began to evaluate for pathologically enlarged lymph nodes starting on the RIGHT  side progressing  to the LEFT side.  All lymph node biopsies performed with 21G  needle. Lymph node biopsies were sent in cytolite for all stations.   STATION 10R - 6mm not biopsied STATION 7 - 1.2cm biopsied 3 times STATION 10L - 5mm not biopsied  STATION 4L - 6mm not biopsied    03/22/2023 Cancer Staging   Staging form: Lung, AJCC 8th Edition - Clinical stage from 03/22/2023: cT1, cN3, cM0 - Signed by [name], MD on 03/22/2023 Stage prefix: Initial diagnosis   04/20/2023 - 06/08/2023 Chemotherapy   Patient is on Treatment Plan : LUNG Carboplatin + Paclitaxel + XRT q7d     09/01/2023 -  Chemotherapy   Patient is on Treatment Plan : LUNG Durvalumab (10) q14d        Patient  presents for evaluation prior to chemotherapy. He reports feel well He was recently hospitalized due to pneumonia, and COPD exacerbation.  Also recent inpatient psyc admission for depression.  Today he reports  shortness of breath has improved after tapering course of steroid.  chronic SOB, finished  10 days course of Doxycycline.  Chronic cough, he requests a refill of mucinex  He reports being compliant with Eliquis.  No bleeding events, dark stool.      MEDICAL HISTORY:  Past Medical History:  Diagnosis Date   Acute on chronic respiratory failure with hypoxia (HCC)    Asthma    Coagulation disorder (HCC)    COPD (chronic obstructive pulmonary disease) (HCC)    Depression    History of hiatal hernia    Hypertension    Hypokalemia    Leukocytosis    Lung mass    Normocytic anemia 07/13/2023   Pneumonia    Schizophrenia (HCC)    Shortness of breath dyspnea    Stroke [facility])    Umbilical hernia    Ventral hernia     SURGICAL HISTORY: Past Surgical History:  Procedure Laterality Date   COLONOSCOPY WITH PROPOFOL N/A 09/21/2021   Procedure: COLONOSCOPY WITH PROPOFOL;  Surgeon: Regis Bill, MD;  Location: [facility] ENDOSCOPY;  Service: Endoscopy;  Laterality: N/A;   HERNIA REPAIR     INSERTION OF MESH N/A 12/18/2014   Procedure: INSERTION OF MESH;  Surgeon: Lattie Haw, MD;  Location: [facility] ORS;  Service: General;  Laterality: N/A;   SUPRA-UMBILICAL HERNIA  12/18/2014   Procedure: SUPRA-UMBILICAL HERNIA;  Surgeon: Lattie Haw, MD;  Location: [facility] ORS;  Service: General;;   UMBILICAL HERNIA REPAIR N/A 12/18/2014   Procedure: HERNIA REPAIR UMBILICAL ADULT;  Surgeon: Lattie Haw, MD;  Location: [facility] ORS;  Service: General;  Laterality: N/A;   VIDEO BRONCHOSCOPY WITH ENDOBRONCHIAL ULTRASOUND N/A 03/15/2023   Procedure: VIDEO BRONCHOSCOPY WITH ENDOBRONCHIAL ULTRASOUND;  Surgeon: Vida Rigger, MD;  Location: [facility] ORS;  Service: Thoracic;  Laterality: N/A;    SOCIAL  HISTORY: Social History   Socioeconomic History   Marital status: Widowed    Spouse name: Not on file   Number of children: Not on file   Years of education: Not on file   Highest education level: Not on file  Occupational History   Not on file  Tobacco Use   Smoking status: Every Day    Current packs/day: 0.15    Average packs/day: 0.2 packs/day for 45.0 years (6.8 ttl pk-yrs)    Types: Cigarettes   Smokeless tobacco: Never  Vaping Use   Vaping status: Never Used  Substance and Sexual Activity   Alcohol use: Not Currently    Alcohol/week:  75.0 standard drinks of alcohol    Types: 75 Cans of beer per week   Drug use: Not Currently    Types: Marijuana, "Crack" cocaine    Comment: none in 15 yrs   Sexual activity: Not Currently  Other Topics Concern   Not on file  Social History Narrative   Not on file   Social Drivers of Health   Financial Resource Strain: Medium Risk (08/18/2023)   Received from [facility]   Overall Financial Resource Strain (CARDIA)    Difficulty of Paying Living Expenses: Somewhat hard  Food Insecurity: Patient Unable To Answer (08/18/2023)   Received from [facility]   Hunger Vital Sign    Worried About Running Out of Food in the Last Year: Patient unable to answer    Ran Out of Food in the Last Year: Patient unable to answer  Transportation Needs: Patient Unable To Answer (08/18/2023)   Received from [facility] - Transportation    In the past 12 months, has lack of transportation kept you from medical appointments or from getting medications?: Patient unable to answer    Lack of Transportation (Non-Medical): Patient unable to answer  Physical Activity: Not on file  Stress: Not on file  Social Connections: Not on file  Intimate Partner Violence: Not At Risk (06/16/2023)   Humiliation, Afraid, Rape, and Kick questionnaire    Fear of Current or Ex-Partner: No    Emotionally Abused: No     Physically Abused: No    Sexually Abused: No    FAMILY HISTORY: Family History  Problem Relation Age of Onset   Asthma Mother    Hypertension Father     ALLERGIES:  has no known allergies.  MEDICATIONS:  Current Outpatient Medications  Medication Sig Dispense Refill   albuterol (VENTOLIN HFA) 108 (90 Base) MCG/ACT inhaler Inhale 1-2 puffs into the lungs every 6 (six) hours as needed for wheezing or shortness of breath. 8 g 0   apixaban (ELIQUIS) 5 MG TABS tablet Take 1 tablet (5 mg total) by mouth 2 (two) times daily. 60 tablet 3   APIXABAN (ELIQUIS) VTE STARTER PACK (10MG  AND 5MG ) Take as directed on package: start with two-5mg  tablets twice daily for 7 days. On day 8, switch to one-5mg  tablet twice daily. 74 each 0   clopidogrel (PLAVIX) 75 MG tablet Take 1 tablet (75 mg total) by mouth daily. 30 tablet 0   diltiazem (CARDIZEM CD) 240 MG 24 hr capsule Take 1 capsule (240 mg total) by mouth daily. 30 capsule 3   famotidine (PEPCID) 20 MG tablet Take 1 tablet (20 mg total) by mouth daily. 30 tablet 0   FLUoxetine (PROZAC) 20 MG capsule Take 1 capsule (20 mg total) by mouth daily. 30 capsule 3   Fluticasone-Umeclidin-Vilant (TRELEGY ELLIPTA) 100-62.5-25 MCG/ACT AEPB Inhale 1 puff into the lungs daily. 28 each 1   ipratropium-albuterol (DUONEB) 0.5-2.5 (3) MG/3ML SOLN Take 3 mLs by nebulization 3 (three) times daily. 360 mL 1   potassium chloride (KLOR-CON M) 10 MEQ tablet Take 1 tablet (10 mEq total) by mouth daily. 30 tablet 1   pravastatin (PRAVACHOL) 20 MG tablet Take 1 tablet (20 mg total) by mouth daily at 6 PM. 30 tablet 0   QUEtiapine (SEROQUEL) 300 MG tablet Take 2 tablets (600 mg total) by mouth at bedtime. 60 tablet 0   senna-docusate (SENOKOT-S) 8.6-50 MG tablet Take 1 tablet by mouth daily after breakfast. 30 tablet  0   traZODone (DESYREL) 100 MG tablet Take 2 tablets (200 mg total) by mouth at bedtime as needed for sleep. 30 tablet 0   umeclidinium bromide (INCRUSE  ELLIPTA) 62.5 MCG/ACT AEPB Inhale 1 puff into the lungs daily. 7 each 1   guaiFENesin (MUCINEX) 600 MG 12 hr tablet Take 1 tablet (600 mg total) by mouth 2 (two) times daily. 30 tablet 1   mirtazapine (REMERON) 7.5 MG tablet Take 1 tablet (7.5 mg total) by mouth at bedtime. 30 tablet 5   montelukast (SINGULAIR) 10 MG tablet Take 1 tablet (10 mg total) by mouth at bedtime. 30 tablet 1   No current facility-administered medications for this visit.   Facility-Administered Medications Ordered in Other Visits  Medication Dose Route Frequency Provider Last Rate Last Admin   0.9 %  sodium chloride infusion   Intravenous Continuous [name], MD   Stopped at 09/01/23 1103    Review of Systems  Constitutional:  Negative for appetite change, chills, fatigue, fever and unexpected weight change.  HENT:   Negative for hearing loss and voice change.   Eyes:  Negative for eye problems and icterus.  Respiratory:  Positive for cough and shortness of breath. Negative for chest tightness and hemoptysis.   Cardiovascular:  Negative for chest pain and leg swelling.  Gastrointestinal:  Negative for abdominal distention and abdominal pain.  Endocrine: Negative for hot flashes.  Genitourinary:  Negative for difficulty urinating, dysuria and frequency.   Musculoskeletal:  Negative for arthralgias.  Skin:  Negative for itching and rash.  Neurological:  Negative for light-headedness and numbness.  Hematological:  Negative for adenopathy. Does not bruise/bleed easily.  Psychiatric/Behavioral:  Negative for confusion.      PHYSICAL EXAMINATION: ECOG PERFORMANCE STATUS: 0 - Asymptomatic  Vitals:   09/01/23 0842  BP: (!) 155/93  Pulse: 95  Resp: 18  Temp: 97.9 F (36.6 C)  SpO2: 95%   Filed Weights   09/01/23 0842  Weight: 233 lb 4.8 oz (105.8 kg)    Physical Exam Constitutional:      General: He is not in acute distress.    Appearance: He is not diaphoretic.  HENT:     Head: Normocephalic and  atraumatic.  Eyes:     General: No scleral icterus. Cardiovascular:     Rate and Rhythm: Normal rate and regular rhythm.  Pulmonary:     Effort: Pulmonary effort is normal. No respiratory distress.     Breath sounds: No wheezing.     Comments: Decreased breath sound bilaterally, poor air entry Abdominal:     General: There is no distension.     Palpations: Abdomen is soft.     Tenderness: There is no abdominal tenderness.  Musculoskeletal:        General: Normal range of motion.     Cervical back: Normal range of motion and neck supple.  Skin:    General: Skin is warm and dry.     Findings: No erythema.  Neurological:     Mental Status: He is alert and oriented to person, place, and time. Mental status is at baseline.     Motor: No abnormal muscle tone.  Psychiatric:        Mood and Affect: Affect normal.     Comments: Flat      LABORATORY DATA:  I have reviewed the data as listed    Latest Ref Rng & Units 09/01/2023    7:56 AM 08/25/2023    7:52 AM 08/11/2023  8:23 AM  CBC  WBC 4.0 - 10.5 K/uL 5.4  6.2  4.9   Hemoglobin 13.0 - 17.0 g/dL 13.0  86.5  78.4   Hematocrit 39.0 - 52.0 % 36.3  33.8  36.2   Platelets 150 - 400 K/uL 181  184  213       Latest Ref Rng & Units 09/01/2023    7:56 AM 08/25/2023    7:52 AM 08/11/2023    8:23 AM  CMP  Glucose 70 - 99 mg/dL 696  295  284   BUN 8 - 23 mg/dL 6  8  <5   Creatinine 1.32 - 1.24 mg/dL 4.40  1.02  7.25   Sodium 135 - 145 mmol/L 140  139  138   Potassium 3.5 - 5.1 mmol/L 3.5  3.0  3.0   Chloride 98 - 111 mmol/L 104  105  105   CO2 22 - 32 mmol/L 27  24  23    Calcium 8.9 - 10.3 mg/dL 9.2  9.1  9.3   Total Protein 6.5 - 8.1 g/dL 6.7  6.2  7.6   Total Bilirubin 0.0 - 1.2 mg/dL 0.5  0.5  0.7   Alkaline Phos 38 - 126 U/L 57  49  72   AST 15 - 41 U/L 22  13  24    ALT 0 - 44 U/L 9  9  14       RADIOGRAPHIC STUDIES: I have personally reviewed the radiological images as listed and agreed with the findings in the report. No  results found.

## 2023-09-01 NOTE — Assessment & Plan Note (Signed)
Recommend Elqiuis 5mg  BID. Likely provoked due to recent admission.  He is on Plavix 75mg  daily. Stop aspirin

## 2023-09-01 NOTE — Assessment & Plan Note (Signed)
Continue albuterol inhaler PRN, Duonebs.  follow up with pulmonology Dr. Meredeth Ide.  Continue Singulair, mucinex.

## 2023-09-04 IMAGING — CT NM PET TUM IMG INITIAL (PI) SKULL BASE T - THIGH
6 series · 25 of 25 positions shown · non-contrast
Comparison: CT chest dated 12/08/2021

CLINICAL DATA: Initial treatment strategy for right lung nodule.

EXAM:
NUCLEAR MEDICINE PET SKULL BASE TO THIGH
TECHNIQUE: 11.5 mCi F-18 FDG was injected intravenously. Full-ring PET imaging
was performed from the skull base to thigh after the radiotracer. CT
data was obtained and used for attenuation correction and anatomic
localization.
Fasting blood glucose: 128 mg/dl

[Series 2: ct slices · axial · 3.8mm · 1.37mm/px · z∈[-963,+12]mm · 6 of 299 slices shown]
[im 1/299]
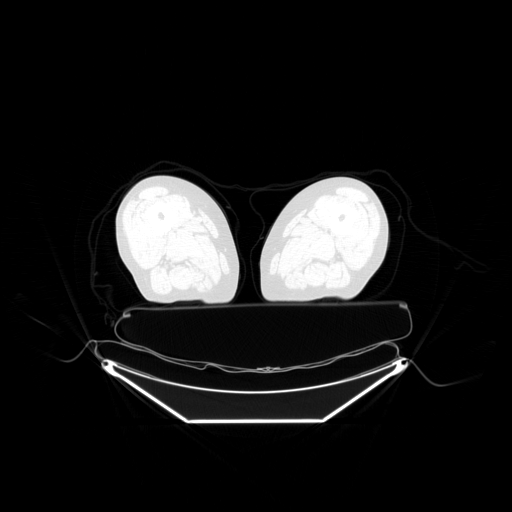
[im 60/299]
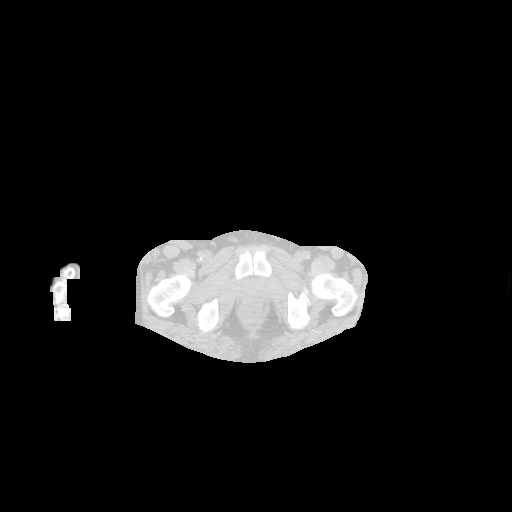
[im 120/299]
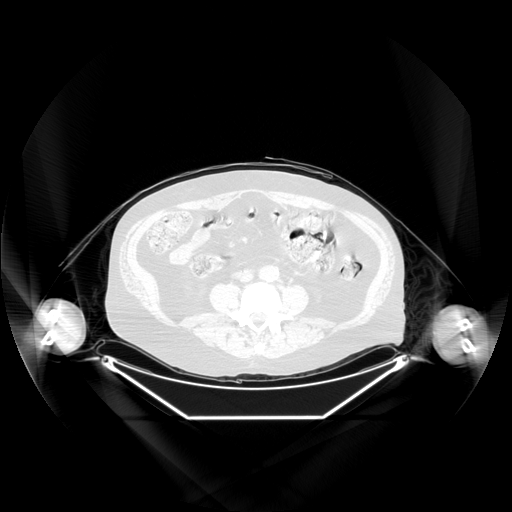
[im 179/299]
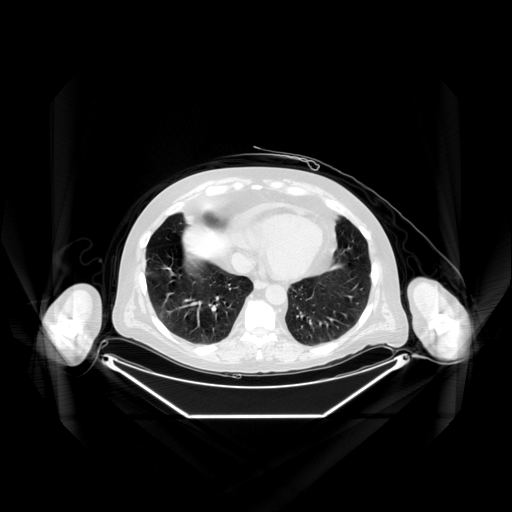
[im 239/299]
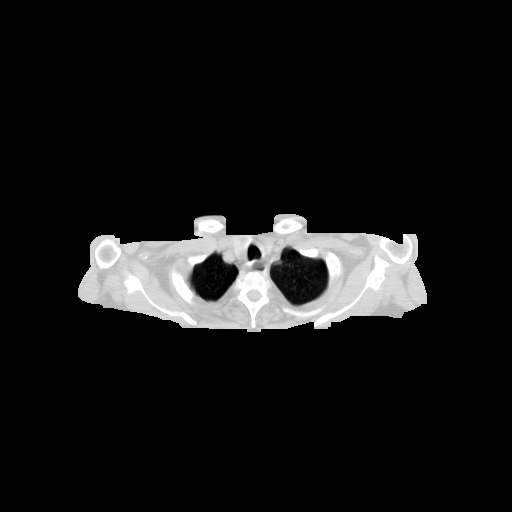
[im 299/299]
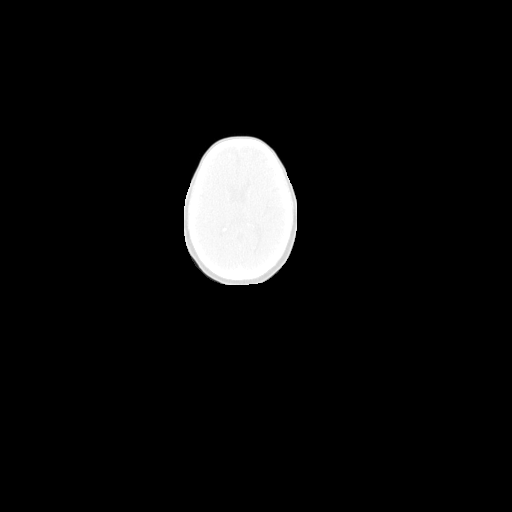

[Series 3: pet ac 3d body · axial · 3.3mm · 5.47mm/px · z∈[-963,+12]mm · 6 of 299 slices shown]
[im 1/299]
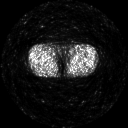
[im 60/299]
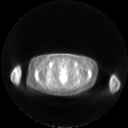
[im 120/299]
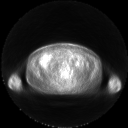
[im 179/299]
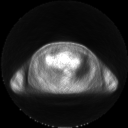
[im 239/299]
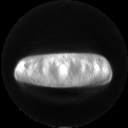
[im 299/299]
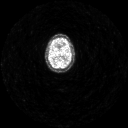

[Series 4: pet nac 3d body · axial · 3.3mm · 5.47mm/px · z∈[-963,+12]mm · 5 of 299 slices shown]
[im 1/299]
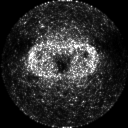
[im 75/299]
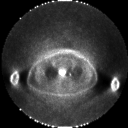
[im 150/299]
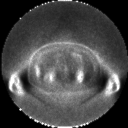
[im 224/299]
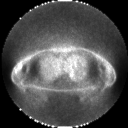
[im 299/299]
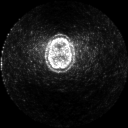

[Series 303: pet axial · axial · 3.3mm · 5.47mm/px · z∈[-963,+12]mm · 5 of 299 slices shown]
[im 1/299]
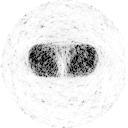
[im 75/299]
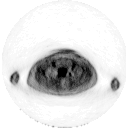
[im 150/299]
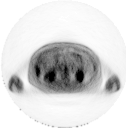
[im 224/299]
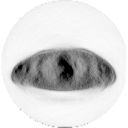
[im 299/299]
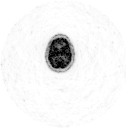

[Series 304: pet sagittal · sagittal · 5.5mm · 7.82mm/px · 2 of 118 slices shown]
[im 1/118]
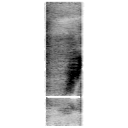
[im 118/118]
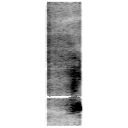

[Series 305: pet coronal · coronal · 5.5mm · 7.82mm/px · 1 of 70 slices shown]
[im 1/70]
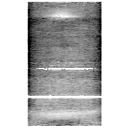

[25 of 25 positions shown; findings below may reference images not displayed]

FINDINGS: Mediastinal blood pool activity: SUV max

Liver activity: SUV max NA

NECK: No hypermetabolic cervical lymphadenopathy.

Incidental CT findings: none

CHEST: 10 mm irregular nodule in the lateral right upper lobe
(series 2/image 81), max SUV 1.7, suspicious.

Two right paratracheal nodes measuring up to 12 mm short axis
(series 2/image 86), max SUV 4.3. Given the small size of the
nodule, these nodes are favored to be reactive but warrant
follow-up.

Incidental CT findings: Mild atherosclerotic calcifications of the
arch. Mild three-vessel coronary sclerosis. Trace pericardial fluid.

ABDOMEN/PELVIS: No abnormal hypermetabolism in the liver, spleen,
pancreas, or adrenal glands. 17 mm right adrenal adenoma. 15 mm left
adrenal myelolipoma.

No hypermetabolic abdominopelvic lymphadenopathy.

Incidental CT findings: Mild cholelithiasis, without associated
inflammatory changes. Bilateral renal cysts. Atherosclerotic
calcifications the abdominal aorta and branch vessels. Sigmoid
diverticulosis, without evidence of diverticulitis.

SKELETON: No focal hypermetabolic activity to suggest skeletal
metastasis.

Incidental CT findings: Mild degenerative changes of the visualized
thoracolumbar spine.
IMPRESSION: 10 mm irregular nodule in the lateral right upper lobe, suspicious
for primary bronchogenic neoplasm.

Two small right paratracheal nodes, indeterminate and favored to be
reactive, but warranting attention on follow-up.

No findings specific for metastatic disease.

## 2023-09-07 NOTE — Assessment & Plan Note (Addendum)
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  Left upper lobe adenocarcinoma, No enough tissue for NGS, will send off liquid biopsy- send off liquid biopsy.  Labs are reviewed and discussed with patient. S/p concurrent chemotherapy radiation. -carboplatin AUC 2 and taxol 45mg /m2 today.  CT showed stable disease, incidental PE  Clinically he is doing well today, s/p 1 dose of Durvalumab. Physical examination shows no wheezing.

## 2023-09-08 ENCOUNTER — Ambulatory Visit: Payer: 59

## 2023-09-08 ENCOUNTER — Inpatient Hospital Stay (HOSPITAL_BASED_OUTPATIENT_CLINIC_OR_DEPARTMENT_OTHER): Payer: 59 | Admitting: Oncology

## 2023-09-08 ENCOUNTER — Other Ambulatory Visit: Payer: 59

## 2023-09-08 ENCOUNTER — Ambulatory Visit: Payer: 59 | Admitting: Oncology

## 2023-09-08 ENCOUNTER — Encounter: Payer: Self-pay | Admitting: Oncology

## 2023-09-08 ENCOUNTER — Inpatient Hospital Stay: Payer: 59

## 2023-09-08 VITALS — BP 133/84 | HR 89 | Temp 97.4°F | Resp 18 | Ht 73.0 in | Wt 231.0 lb

## 2023-09-08 DIAGNOSIS — C3492 Malignant neoplasm of unspecified part of left bronchus or lung: Secondary | ICD-10-CM

## 2023-09-08 DIAGNOSIS — J449 Chronic obstructive pulmonary disease, unspecified: Secondary | ICD-10-CM

## 2023-09-08 DIAGNOSIS — I2699 Other pulmonary embolism without acute cor pulmonale: Secondary | ICD-10-CM

## 2023-09-08 DIAGNOSIS — C3412 Malignant neoplasm of upper lobe, left bronchus or lung: Secondary | ICD-10-CM | POA: Diagnosis not present

## 2023-09-08 LAB — CBC WITH DIFFERENTIAL (CANCER CENTER ONLY)
Abs Immature Granulocytes: 0.02 10*3/uL (ref 0.00–0.07)
Basophils Absolute: 0 10*3/uL (ref 0.0–0.1)
Basophils Relative: 0 %
Eosinophils Absolute: 0.1 10*3/uL (ref 0.0–0.5)
Eosinophils Relative: 3 %
HCT: 33.5 % — ABNORMAL LOW (ref 39.0–52.0)
Hemoglobin: 10.6 g/dL — ABNORMAL LOW (ref 13.0–17.0)
Immature Granulocytes: 0 %
Lymphocytes Relative: 16 %
Lymphs Abs: 0.8 10*3/uL (ref 0.7–4.0)
MCH: 27.9 pg (ref 26.0–34.0)
MCHC: 31.6 g/dL (ref 30.0–36.0)
MCV: 88.2 fL (ref 80.0–100.0)
Monocytes Absolute: 0.4 10*3/uL (ref 0.1–1.0)
Monocytes Relative: 7 %
Neutro Abs: 3.6 10*3/uL (ref 1.7–7.7)
Neutrophils Relative %: 74 %
Platelet Count: 213 10*3/uL (ref 150–400)
RBC: 3.8 MIL/uL — ABNORMAL LOW (ref 4.22–5.81)
RDW: 15.4 % (ref 11.5–15.5)
WBC Count: 4.9 10*3/uL (ref 4.0–10.5)
nRBC: 0 % (ref 0.0–0.2)

## 2023-09-08 LAB — CMP (CANCER CENTER ONLY)
ALT: 9 U/L (ref 0–44)
AST: 15 U/L (ref 15–41)
Albumin: 3.6 g/dL (ref 3.5–5.0)
Alkaline Phosphatase: 62 U/L (ref 38–126)
Anion gap: 8 (ref 5–15)
BUN: 6 mg/dL — ABNORMAL LOW (ref 8–23)
CO2: 26 mmol/L (ref 22–32)
Calcium: 9.1 mg/dL (ref 8.9–10.3)
Chloride: 103 mmol/L (ref 98–111)
Creatinine: 0.59 mg/dL — ABNORMAL LOW (ref 0.61–1.24)
GFR, Estimated: >60 mL/min (ref >60)
Glucose, Bld: 107 mg/dL — ABNORMAL HIGH (ref 70–99)
Potassium: 3.5 mmol/L (ref 3.5–5.1)
Sodium: 137 mmol/L (ref 135–145)
Total Bilirubin: 0.5 mg/dL (ref 0.0–1.2)
Total Protein: 7.1 g/dL (ref 6.5–8.1)

## 2023-09-08 NOTE — Assessment & Plan Note (Signed)
 Continue albuterol inhaler PRN, Duonebs.  follow up with pulmonology Dr. Meredeth Ide.  Continue Singulair, mucinex.

## 2023-09-08 NOTE — Assessment & Plan Note (Signed)
 Recommend Elqiuis 5mg  BID. Likely provoked due to recent admission.  He is on Plavix 75mg  daily. Stop aspirin

## 2023-09-08 NOTE — Progress Notes (Signed)
Hematology/Oncology Progress note Telephone:(336) [phone] Fax:(336) [phone]      CHIEF COMPLAINTS/PURPOSE OF CONSULTATION:  Stage III Lung cancer  ASSESSMENT & PLAN:   Cancer Staging  Adenocarcinoma of left lung [facility]) Staging form: Lung, AJCC 8th Edition - Clinical stage from 03/22/2023: cT1, cN3, cM0 - Signed by [name], MD on 03/22/2023   Adenocarcinoma of left lung (HCC) Clinically he has Stage III left lung cancer He has right paratracheal lymph node hypermetabolic activity, although Station 7 lymph node showed atypical cell, no definite malignancy.  Left upper lobe adenocarcinoma, No enough tissue for NGS, will send off liquid biopsy- send off liquid biopsy.  Labs are reviewed and discussed with patient. S/p concurrent chemotherapy radiation. -carboplatin AUC 2 and taxol 45mg /m2 today.  CT showed stable disease, incidental PE  Clinically he is doing well today, s/p 1 dose of Durvalumab. Physical examination shows no wheezing.     COPD (chronic obstructive pulmonary disease) (HCC) Continue albuterol inhaler PRN, Duonebs.  follow up with pulmonology Dr. Meredeth Ide.  Continue Singulair, mucinex.   Pulmonary embolism (HCC) Recommend Elqiuis 5mg  BID. Likely provoked due to recent admission.  He is on Plavix 75mg  daily. Stop aspirin         No orders of the defined types were placed in this encounter.  Follow up  1 week Lab MD All questions were answered. The patient knows to call the clinic with any problems, questions or concerns.  [name], MD, PhD [facility] Hematology Oncology 09/08/2023    HISTORY OF PRESENTING ILLNESS:  [name] 63 y.o. male presents to establish care for lung mass I have reviewed his chart and materials related to his cancer extensively and collaborated history with the patient. Summary of oncologic history is as follows:  12/08/2021 CT chest with contrast showed irregular solid pulmonary nodule of right upper lobe, increased in size.   Bibasilar consolidation.  Mildly enlarged right lower paratracheal lymph node.  Atherosclerosis. 01/04/2022 PET scan showed 10 mm irregular nodule in the lateral right upper lobe, suspicious for primary bronchogenic neoplasm. 2 small right paratracheal nodes, indeterminate and favored to be reactive.  Attention on follow-up.  Patient was seen by radiation oncology Dr. Rushie Chestnut.  Patient was offered SBRT to the right upper lobe nodule/presumed non-small cell lung cancer stage I.  Patient lives in a group home.  He reports chronic cough.  Some shortness of breath with exertion. He is a current everyday smoker.  Few cigarettes per day.  He denies any unintentional weight loss, night sweats, fever or chills. Patient is on aspirin and Plavix   INTERVAL HISTORY [name] is a 63 y.o. male who has above history reviewed by me today presents for follow up visit for lung cancer.  Oncology History  Adenocarcinoma of left lung (HCC)  12/29/2022 Imaging   CT chest with contrast showed Stable radiation changes involving the right upper lobe with a small residual treated nodule.  Decrease in size.  Interval enlargement of the left upper lobe pulmonary nodule.  Stable mediastinal nodes stable advanced emphysematous changes and areas of pulmonary scarring.  Stable bilateral adrenal gland adenomas.  Emphysema   01/30/2023 Imaging   PET 1. Recurrent bronchogenic carcinoma in the left upper lobe with new and increasingly hypermetabolic mediastinal lymph nodes. No evidence of distant metastatic disease. 2. Possible mild prostate hypermetabolism. Consider laboratory correlation. 3. Liver is mildly heterogeneous, raising suspicion for steatosis. 4. Cholelithiasis. 5. Right adrenal adenoma.  Left adrenal myelolipoma. 6. Aortic atherosclerosis (ICD10-I70.0).  Coronary artery calcification. 7. Enlarged pulmonic trunk, indicative of pulmonary arterial hypertension.   03/15/2023 Initial Diagnosis   Adenocarcinoma of  left lung   Bronchoscopy showed  Lung, biopsy, Left upper lobe - NON-SMALL CELL CARCINOMA, FAVOR ADENOCARCINOMA.  1. Bronchial Lavage, Left upper lobe - POSITIVE FOR MALIGNANCY. - NON-SMALL CELL CARCINOMA, FAVOR ADENOCARCINOMA. - SEE NOTE. 2. Bronchial Brushing, Left upper lobe - POSITIVE FOR MALIGNANCY. - NON-SMALL CELL CARCINOMA, FAVOR ADENOCARCINOMA. 3. Bronchus, biopsy, left upper lobe - SEE [id number] 4. Fine Needle Aspiration, left upper lobe - POSITIVE FOR MALIGNANCY. - NON-SMALL CELL CARCINOMA, FAVOR ADENOCARCINOMA. 5. Fine Needle Aspiration, station 7 lymph node - ATYPICAL. - BACKGROUND LYMPHOID TISSUE IS PRESENT CONSISTENT WITH LYMPH NODE SAMPLING.    03/15/2023 Procedure   Fiberoptic bronchoscopy with airway inspection and BAL Procedure findings:   Bronchoscope was inserted via ETT  without difficulty.  Posterior oropharynx, epiglottis, arytenoids, false cords and vocal cords were not visualized as these were bypassed by endotracheal tube. The distal trachea was normal in circumference and appearance without mucosal, cartilaginous or branching abnormalities.  The main carina was mildly splayed . All right and left lobar airways were visualized to the Subsegmental level.  Sub- sub segmental carinae were identified in all the distal airways.   Secretions were visible in the following airways and appeared to be clear.  The mucosa was : friable at LEFT UPPER LOBE   Airways were notable for:        exophytic lesions :n       extrinsic compression in the following distributions: n.       Friable mucosa: y       Teacher, music /pigmentation: n   MUCUS PLUGGING WAS SEVERE ON LEFT SIDE AND COMPLETE OBSTRUCTED MULTIPLE AIRWAYS.  UNABLE TO NAVIGATE THROUGH TRACHEOBRONCHIAL TREE DUE TO SEVERE MUCUS PLUGGING. THERAPEUTIC ASPIRATION WAS PERFORMED X 7 AND WAS DONE BILATERALLY ALTHOUGH WORSE ON LEFT.  BAL WAS PERFORMED AT LEFT UPPER LOBE X 2.     Endobronchial ultrasound  assisted hilar and mediastinal lymph node biopsies procedure findings: The fiberoptic bronchoscope was removed and the EBUS scope was introduced. Examination began to evaluate for pathologically enlarged lymph nodes starting on the RIGHT  side progressing to the LEFT side.  All lymph node biopsies performed with 21G  needle. Lymph node biopsies were sent in cytolite for all stations.   STATION 10R - 6mm not biopsied STATION 7 - 1.2cm biopsied 3 times STATION 10L - 5mm not biopsied  STATION 4L - 6mm not biopsied    03/22/2023 Cancer Staging   Staging form: Lung, AJCC 8th Edition - Clinical stage from 03/22/2023: cT1, cN3, cM0 - Signed by [name], MD on 03/22/2023 Stage prefix: Initial diagnosis   04/20/2023 - 06/08/2023 Chemotherapy   Patient is on Treatment Plan : LUNG Carboplatin + Paclitaxel + XRT q7d     09/01/2023 -  Chemotherapy   Patient is on Treatment Plan : LUNG Durvalumab (10) q14d        Patient presents for evaluation prior to chemotherapy. He reports feel well Today he reports doing well. Tolerated durvalumab Chronic cough and SOB, now worse. He reports being compliant with Eliquis.  No bleeding events, dark stool.      MEDICAL HISTORY:  Past Medical History:  Diagnosis Date   Acute on chronic respiratory failure with hypoxia (HCC)    Asthma    Coagulation disorder (HCC)    COPD (chronic obstructive pulmonary disease) (HCC)    Depression  History of hiatal hernia    Hypertension    Hypokalemia    Leukocytosis    Lung mass    Normocytic anemia 07/13/2023   Pneumonia    Schizophrenia (HCC)    Shortness of breath dyspnea    Stroke [facility])    Umbilical hernia    Ventral hernia     SURGICAL HISTORY: Past Surgical History:  Procedure Laterality Date   COLONOSCOPY WITH PROPOFOL N/A 09/21/2021   Procedure: COLONOSCOPY WITH PROPOFOL;  Surgeon: Regis Bill, MD;  Location: [facility] ENDOSCOPY;  Service: Endoscopy;  Laterality: N/A;   HERNIA REPAIR     INSERTION  OF MESH N/A 12/18/2014   Procedure: INSERTION OF MESH;  Surgeon: Lattie Haw, MD;  Location: [facility] ORS;  Service: General;  Laterality: N/A;   SUPRA-UMBILICAL HERNIA  12/18/2014   Procedure: SUPRA-UMBILICAL HERNIA;  Surgeon: Lattie Haw, MD;  Location: [facility] ORS;  Service: General;;   UMBILICAL HERNIA REPAIR N/A 12/18/2014   Procedure: HERNIA REPAIR UMBILICAL ADULT;  Surgeon: Lattie Haw, MD;  Location: [facility] ORS;  Service: General;  Laterality: N/A;   VIDEO BRONCHOSCOPY WITH ENDOBRONCHIAL ULTRASOUND N/A 03/15/2023   Procedure: VIDEO BRONCHOSCOPY WITH ENDOBRONCHIAL ULTRASOUND;  Surgeon: Vida Rigger, MD;  Location: [facility] ORS;  Service: Thoracic;  Laterality: N/A;    SOCIAL HISTORY: Social History   Socioeconomic History   Marital status: Widowed    Spouse name: Not on file   Number of children: Not on file   Years of education: Not on file   Highest education level: Not on file  Occupational History   Not on file  Tobacco Use   Smoking status: Every Day    Current packs/day: 0.15    Average packs/day: 0.2 packs/day for 45.0 years (6.8 ttl pk-yrs)    Types: Cigarettes   Smokeless tobacco: Never  Vaping Use   Vaping status: Never Used  Substance and Sexual Activity   Alcohol use: Not Currently    Alcohol/week: 75.0 standard drinks of alcohol    Types: 75 Cans of beer per week   Drug use: Not Currently    Types: Marijuana, "Crack" cocaine    Comment: none in 15 yrs   Sexual activity: Not Currently  Other Topics Concern   Not on file  Social History Narrative   Not on file   Social Drivers of Health   Financial Resource Strain: Medium Risk (08/18/2023)   Received from [facility]   Overall Financial Resource Strain (CARDIA)    Difficulty of Paying Living Expenses: Somewhat hard  Food Insecurity: Patient Unable To Answer (08/18/2023)   Received from [facility]   Hunger Vital Sign    Worried About Running Out of Food in the Last  Year: Patient unable to answer    Ran Out of Food in the Last Year: Patient unable to answer  Transportation Needs: Patient Unable To Answer (08/18/2023)   Received from [facility] - Transportation    In the past 12 months, has lack of transportation kept you from medical appointments or from getting medications?: Patient unable to answer    Lack of Transportation (Non-Medical): Patient unable to answer  Physical Activity: Not on file  Stress: Not on file  Social Connections: Not on file  Intimate Partner Violence: Not At Risk (06/16/2023)   Humiliation, Afraid, Rape, and Kick questionnaire    Fear of Current or Ex-Partner: No    Emotionally Abused: No  Physically Abused: No    Sexually Abused: No    FAMILY HISTORY: Family History  Problem Relation Age of Onset   Asthma Mother    Hypertension Father     ALLERGIES:  has no known allergies.  MEDICATIONS:  Current Outpatient Medications  Medication Sig Dispense Refill   albuterol (VENTOLIN HFA) 108 (90 Base) MCG/ACT inhaler Inhale 1-2 puffs into the lungs every 6 (six) hours as needed for wheezing or shortness of breath. 8 g 0   apixaban (ELIQUIS) 5 MG TABS tablet Take 1 tablet (5 mg total) by mouth 2 (two) times daily. 60 tablet 3   APIXABAN (ELIQUIS) VTE STARTER PACK (10MG  AND 5MG ) Take as directed on package: start with two-5mg  tablets twice daily for 7 days. On day 8, switch to one-5mg  tablet twice daily. 74 each 0   clopidogrel (PLAVIX) 75 MG tablet Take 1 tablet (75 mg total) by mouth daily. 30 tablet 0   diltiazem (CARDIZEM CD) 240 MG 24 hr capsule Take 1 capsule (240 mg total) by mouth daily. 30 capsule 3   famotidine (PEPCID) 20 MG tablet Take 1 tablet (20 mg total) by mouth daily. 30 tablet 0   FLUoxetine (PROZAC) 20 MG capsule Take 1 capsule (20 mg total) by mouth daily. 30 capsule 3   Fluticasone-Umeclidin-Vilant (TRELEGY ELLIPTA) 100-62.5-25 MCG/ACT AEPB Inhale 1 puff into the lungs daily.  28 each 1   guaiFENesin (MUCINEX) 600 MG 12 hr tablet Take 1 tablet (600 mg total) by mouth 2 (two) times daily. 30 tablet 1   ipratropium-albuterol (DUONEB) 0.5-2.5 (3) MG/3ML SOLN Take 3 mLs by nebulization 3 (three) times daily. 360 mL 1   mirtazapine (REMERON) 7.5 MG tablet Take 1 tablet (7.5 mg total) by mouth at bedtime. 30 tablet 5   montelukast (SINGULAIR) 10 MG tablet Take 1 tablet (10 mg total) by mouth at bedtime. 30 tablet 1   potassium chloride (KLOR-CON M) 10 MEQ tablet Take 1 tablet (10 mEq total) by mouth daily. 30 tablet 1   pravastatin (PRAVACHOL) 20 MG tablet Take 1 tablet (20 mg total) by mouth daily at 6 PM. 30 tablet 0   QUEtiapine (SEROQUEL) 300 MG tablet Take 2 tablets (600 mg total) by mouth at bedtime. 60 tablet 0   senna-docusate (SENOKOT-S) 8.6-50 MG tablet Take 1 tablet by mouth daily after breakfast. 30 tablet 0   traZODone (DESYREL) 100 MG tablet Take 2 tablets (200 mg total) by mouth at bedtime as needed for sleep. 30 tablet 0   umeclidinium bromide (INCRUSE ELLIPTA) 62.5 MCG/ACT AEPB Inhale 1 puff into the lungs daily. 7 each 1   No current facility-administered medications for this visit.    Review of Systems  Constitutional:  Negative for appetite change, chills, fatigue, fever and unexpected weight change.  HENT:   Negative for hearing loss and voice change.   Eyes:  Negative for eye problems and icterus.  Respiratory:  Positive for cough. Negative for chest tightness and hemoptysis.   Cardiovascular:  Negative for chest pain and leg swelling.  Gastrointestinal:  Negative for abdominal distention and abdominal pain.  Endocrine: Negative for hot flashes.  Genitourinary:  Negative for difficulty urinating, dysuria and frequency.   Musculoskeletal:  Negative for arthralgias.  Skin:  Negative for itching and rash.  Neurological:  Negative for light-headedness and numbness.  Hematological:  Negative for adenopathy. Does not bruise/bleed easily.   Psychiatric/Behavioral:  Negative for confusion.      PHYSICAL EXAMINATION: ECOG PERFORMANCE STATUS: 0 - Asymptomatic  Vitals:   09/08/23 0844  BP: 133/84  Pulse: 89  Resp: 18  Temp: (!) 97.4 F (36.3 C)  SpO2: 94%   Filed Weights   09/08/23 0844  Weight: 231 lb (104.8 kg)    Physical Exam Constitutional:      General: He is not in acute distress.    Appearance: He is not diaphoretic.  HENT:     Head: Normocephalic and atraumatic.  Eyes:     General: No scleral icterus. Cardiovascular:     Rate and Rhythm: Normal rate and regular rhythm.  Pulmonary:     Effort: Pulmonary effort is normal. No respiratory distress.     Breath sounds: No wheezing.     Comments: Decreased breath sound bilaterally, poor air entry Abdominal:     General: There is no distension.     Palpations: Abdomen is soft.     Tenderness: There is no abdominal tenderness.  Musculoskeletal:        General: Normal range of motion.     Cervical back: Normal range of motion and neck supple.  Skin:    General: Skin is warm and dry.     Findings: No erythema.  Neurological:     Mental Status: He is alert and oriented to person, place, and time. Mental status is at baseline.     Motor: No abnormal muscle tone.  Psychiatric:        Mood and Affect: Affect normal.     Comments: Flat      LABORATORY DATA:  I have reviewed the data as listed    Latest Ref Rng & Units 09/08/2023    8:17 AM 09/01/2023    7:56 AM 08/25/2023    7:52 AM  CBC  WBC 4.0 - 10.5 K/uL 4.9  5.4  6.2   Hemoglobin 13.0 - 17.0 g/dL 44.0  34.7  42.5   Hematocrit 39.0 - 52.0 % 33.5  36.3  33.8   Platelets 150 - 400 K/uL 213  181  184       Latest Ref Rng & Units 09/08/2023    8:17 AM 09/01/2023    7:56 AM 08/25/2023    7:52 AM  CMP  Glucose 70 - 99 mg/dL 956  387  564   BUN 8 - 23 mg/dL 6  6  8    Creatinine 0.61 - 1.24 mg/dL 3.32  9.51  8.84   Sodium 135 - 145 mmol/L 137  140  139   Potassium 3.5 - 5.1 mmol/L 3.5  3.5  3.0    Chloride 98 - 111 mmol/L 103  104  105   CO2 22 - 32 mmol/L 26  27  24    Calcium 8.9 - 10.3 mg/dL 9.1  9.2  9.1   Total Protein 6.5 - 8.1 g/dL 7.1  6.7  6.2   Total Bilirubin 0.0 - 1.2 mg/dL 0.5  0.5  0.5   Alkaline Phos 38 - 126 U/L 62  57  49   AST 15 - 41 U/L 15  22  13    ALT 0 - 44 U/L 9  9  9       RADIOGRAPHIC STUDIES: I have personally reviewed the radiological images as listed and agreed with the findings in the report. No results found.

## 2023-09-15 ENCOUNTER — Ambulatory Visit: Payer: 59

## 2023-09-15 ENCOUNTER — Encounter: Payer: Self-pay | Admitting: Oncology

## 2023-09-15 ENCOUNTER — Ambulatory Visit: Payer: 59 | Admitting: Nurse Practitioner

## 2023-09-15 ENCOUNTER — Other Ambulatory Visit: Payer: 59

## 2023-09-21 NOTE — Assessment & Plan Note (Signed)
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  Left upper lobe adenocarcinoma, No enough tissue for NGS, will send off liquid biopsy- send off liquid biopsy.  Labs are reviewed and discussed with patient. S/p concurrent chemotherapy radiation. -carboplatin AUC 2 and taxol 45mg /m2 today.  CT showed stable disease, incidental PE  Clinically he is doing well today, s/p 1 dose of Durvalumab. Physical examination shows no wheezing.

## 2023-09-22 ENCOUNTER — Encounter: Payer: Self-pay | Admitting: Oncology

## 2023-09-22 ENCOUNTER — Inpatient Hospital Stay (HOSPITAL_BASED_OUTPATIENT_CLINIC_OR_DEPARTMENT_OTHER): Payer: 59 | Admitting: Oncology

## 2023-09-22 ENCOUNTER — Inpatient Hospital Stay: Payer: 59

## 2023-09-22 ENCOUNTER — Inpatient Hospital Stay: Payer: 59 | Attending: Oncology

## 2023-09-22 VITALS — BP 126/82 | HR 95 | Temp 97.2°F | Resp 18 | Wt 231.2 lb

## 2023-09-22 DIAGNOSIS — D649 Anemia, unspecified: Secondary | ICD-10-CM | POA: Insufficient documentation

## 2023-09-22 DIAGNOSIS — J449 Chronic obstructive pulmonary disease, unspecified: Secondary | ICD-10-CM

## 2023-09-22 DIAGNOSIS — Z9221 Personal history of antineoplastic chemotherapy: Secondary | ICD-10-CM | POA: Diagnosis not present

## 2023-09-22 DIAGNOSIS — Z8673 Personal history of transient ischemic attack (TIA), and cerebral infarction without residual deficits: Secondary | ICD-10-CM | POA: Diagnosis not present

## 2023-09-22 DIAGNOSIS — Z5112 Encounter for antineoplastic immunotherapy: Secondary | ICD-10-CM

## 2023-09-22 DIAGNOSIS — C3492 Malignant neoplasm of unspecified part of left bronchus or lung: Secondary | ICD-10-CM

## 2023-09-22 DIAGNOSIS — E876 Hypokalemia: Secondary | ICD-10-CM

## 2023-09-22 DIAGNOSIS — Z7982 Long term (current) use of aspirin: Secondary | ICD-10-CM | POA: Insufficient documentation

## 2023-09-22 DIAGNOSIS — I10 Essential (primary) hypertension: Secondary | ICD-10-CM | POA: Diagnosis not present

## 2023-09-22 DIAGNOSIS — I2510 Atherosclerotic heart disease of native coronary artery without angina pectoris: Secondary | ICD-10-CM | POA: Diagnosis not present

## 2023-09-22 DIAGNOSIS — D3501 Benign neoplasm of right adrenal gland: Secondary | ICD-10-CM | POA: Diagnosis not present

## 2023-09-22 DIAGNOSIS — C3412 Malignant neoplasm of upper lobe, left bronchus or lung: Secondary | ICD-10-CM | POA: Insufficient documentation

## 2023-09-22 DIAGNOSIS — Z7901 Long term (current) use of anticoagulants: Secondary | ICD-10-CM | POA: Diagnosis not present

## 2023-09-22 DIAGNOSIS — J4489 Other specified chronic obstructive pulmonary disease: Secondary | ICD-10-CM | POA: Diagnosis not present

## 2023-09-22 DIAGNOSIS — I700 Atherosclerosis of aorta: Secondary | ICD-10-CM | POA: Diagnosis not present

## 2023-09-22 DIAGNOSIS — K8020 Calculus of gallbladder without cholecystitis without obstruction: Secondary | ICD-10-CM | POA: Insufficient documentation

## 2023-09-22 DIAGNOSIS — Z79899 Other long term (current) drug therapy: Secondary | ICD-10-CM | POA: Diagnosis not present

## 2023-09-22 DIAGNOSIS — I2699 Other pulmonary embolism without acute cor pulmonale: Secondary | ICD-10-CM

## 2023-09-22 DIAGNOSIS — D3502 Benign neoplasm of left adrenal gland: Secondary | ICD-10-CM | POA: Insufficient documentation

## 2023-09-22 DIAGNOSIS — Z7902 Long term (current) use of antithrombotics/antiplatelets: Secondary | ICD-10-CM | POA: Insufficient documentation

## 2023-09-22 DIAGNOSIS — F209 Schizophrenia, unspecified: Secondary | ICD-10-CM | POA: Diagnosis not present

## 2023-09-22 DIAGNOSIS — F17210 Nicotine dependence, cigarettes, uncomplicated: Secondary | ICD-10-CM | POA: Insufficient documentation

## 2023-09-22 DIAGNOSIS — Z923 Personal history of irradiation: Secondary | ICD-10-CM | POA: Diagnosis not present

## 2023-09-22 LAB — CBC WITH DIFFERENTIAL (CANCER CENTER ONLY)
Abs Immature Granulocytes: 0.01 10*3/uL (ref 0.00–0.07)
Basophils Absolute: 0 10*3/uL (ref 0.0–0.1)
Basophils Relative: 0 %
Eosinophils Absolute: 0.1 10*3/uL (ref 0.0–0.5)
Eosinophils Relative: 2 %
HCT: 35.1 % — ABNORMAL LOW (ref 39.0–52.0)
Hemoglobin: 11.1 g/dL — ABNORMAL LOW (ref 13.0–17.0)
Immature Granulocytes: 0 %
Lymphocytes Relative: 17 %
Lymphs Abs: 0.8 10*3/uL (ref 0.7–4.0)
MCH: 28 pg (ref 26.0–34.0)
MCHC: 31.6 g/dL (ref 30.0–36.0)
MCV: 88.4 fL (ref 80.0–100.0)
Monocytes Absolute: 0.3 10*3/uL (ref 0.1–1.0)
Monocytes Relative: 6 %
Neutro Abs: 3.7 10*3/uL (ref 1.7–7.7)
Neutrophils Relative %: 75 %
Platelet Count: 178 10*3/uL (ref 150–400)
RBC: 3.97 MIL/uL — ABNORMAL LOW (ref 4.22–5.81)
RDW: 15.2 % (ref 11.5–15.5)
WBC Count: 4.9 10*3/uL (ref 4.0–10.5)
nRBC: 0 % (ref 0.0–0.2)

## 2023-09-22 LAB — CMP (CANCER CENTER ONLY)
ALT: 10 U/L (ref 0–44)
AST: 16 U/L (ref 15–41)
Albumin: 3.7 g/dL (ref 3.5–5.0)
Alkaline Phosphatase: 68 U/L (ref 38–126)
Anion gap: 10 (ref 5–15)
BUN: 7 mg/dL — ABNORMAL LOW (ref 8–23)
CO2: 23 mmol/L (ref 22–32)
Calcium: 9.2 mg/dL (ref 8.9–10.3)
Chloride: 104 mmol/L (ref 98–111)
Creatinine: 0.66 mg/dL (ref 0.61–1.24)
GFR, Estimated: >60 mL/min
Glucose, Bld: 144 mg/dL — ABNORMAL HIGH (ref 70–99)
Potassium: 3.4 mmol/L — ABNORMAL LOW (ref 3.5–5.1)
Sodium: 137 mmol/L (ref 135–145)
Total Bilirubin: 0.5 mg/dL (ref 0.0–1.2)
Total Protein: 7.1 g/dL (ref 6.5–8.1)

## 2023-09-22 MED ORDER — SODIUM CHLORIDE 0.9 % IV SOLN
10.0000 mg/kg | Freq: Once | INTRAVENOUS | Status: AC
  Administered 2023-09-22: 1000 mg via INTRAVENOUS
  Filled 2023-09-22: qty 20

## 2023-09-22 MED ORDER — SODIUM CHLORIDE 0.9 % IV SOLN
INTRAVENOUS | Status: DC
  Filled 2023-09-22: qty 250

## 2023-09-22 NOTE — Patient Instructions (Signed)
[facility] - A DEPT OF [facility]  Discharge Instructions: Thank you for choosing [facility] to provide your oncology and hematology care.  If you have a lab appointment with the Cancer Center, please go directly to the Cancer Center and check in at the registration area.  Wear comfortable clothing and clothing appropriate for easy access to any Portacath or PICC line.   We strive to give you quality time with your provider. You may need to reschedule your appointment if you arrive late (15 or more minutes).  Arriving late affects you and other patients whose appointments are after yours.  Also, if you miss three or more appointments without notifying the office, you may be dismissed from the clinic at the provider's discretion.      For prescription refill requests, have your pharmacy contact our office and allow 72 hours for refills to be completed.    Today you received the following chemotherapy and/or immunotherapy agents Imfinzi      To help prevent nausea and vomiting after your treatment, we encourage you to take your nausea medication as directed.  BELOW ARE SYMPTOMS THAT SHOULD BE REPORTED IMMEDIATELY: *FEVER GREATER THAN 100.4 F (38 C) OR HIGHER *CHILLS OR SWEATING *NAUSEA AND VOMITING THAT IS NOT CONTROLLED WITH YOUR NAUSEA MEDICATION *UNUSUAL SHORTNESS OF BREATH *UNUSUAL BRUISING OR BLEEDING *URINARY PROBLEMS (pain or burning when urinating, or frequent urination) *BOWEL PROBLEMS (unusual diarrhea, constipation, pain near the anus) TENDERNESS IN MOUTH AND THROAT WITH OR WITHOUT PRESENCE OF ULCERS (sore throat, sores in mouth, or a toothache) UNUSUAL RASH, SWELLING OR PAIN  UNUSUAL VAGINAL DISCHARGE OR ITCHING   Items with * indicate a potential emergency and should be followed up as soon as possible or go to the Emergency Department if any problems should occur.  Please show the CHEMOTHERAPY ALERT CARD or IMMUNOTHERAPY  ALERT CARD at check-in to the Emergency Department and triage nurse.  Should you have questions after your visit or need to cancel or reschedule your appointment, please contact [facility] - A DEPT OF [facility]  [phone] and follow the prompts.  Office hours are 8:00 a.m. to 4:30 p.m. Monday - Friday. Please note that voicemails left after 4:00 p.m. may not be returned until the following business day.  We are closed weekends and major holidays. You have access to a nurse at all times for urgent questions. Please call the main number to the clinic [phone] and follow the prompts.  For any non-urgent questions, you may also contact your provider using MyChart. We now offer e-Visits for anyone 1 and older to request care online for non-urgent symptoms. For details visit [email or web address].   Also download the MyChart app! Go to the app store, search "MyChart", open the app, select [place], and log in with your MyChart username and password.

## 2023-09-22 NOTE — Assessment & Plan Note (Signed)
Continue albuterol inhaler PRN, Duonebs.  follow up with pulmonology Dr. Meredeth Ide.  Continue Singulair, mucinex.

## 2023-09-22 NOTE — Progress Notes (Signed)
Hematology/Oncology Progress note [phone] [phone]      CHIEF COMPLAINTS/PURPOSE OF CONSULTATION:  Stage III Lung cancer  ASSESSMENT & PLAN:   Cancer Staging  Adenocarcinoma of left lung [facility]) Staging form: Lung, AJCC 8th Edition - Clinical stage from 03/22/2023: cT1, cN3, cM0 - Signed by [name], MD on 03/22/2023   Adenocarcinoma of left lung (HCC) Clinically he has Stage III left lung cancer He has right paratracheal lymph node hypermetabolic activity, although Station 7 lymph node showed atypical cell, no definite malignancy.  Left upper lobe adenocarcinoma, No enough tissue for NGS, will send off liquid biopsy- send off liquid biopsy.  Labs are reviewed and discussed with patient. S/p concurrent chemotherapy radiation. -carboplatin AUC 2 and taxol 45mg /m2  CT showed stable disease, incidental PE  Clinically he is doing well today, s/p 1 dose of Durvalumab. Physical examination shows no wheezing.  Proceed with Durvalumab maintenance.     COPD (chronic obstructive pulmonary disease) (HCC) Continue albuterol inhaler PRN, Duonebs.  follow up with pulmonology Dr. Meredeth Ide.  Continue Singulair, mucinex.   Encounter for antineoplastic immunotherapy Immunotherapy plan as planned.   Hypokalemia Chronically low, continue  potassium chloride daily,   Normocytic anemia Hb has improved, iron panel showed adequate iron store.   .    Pulmonary embolism (HCC) Recommend Elqiuis 5mg  BID. Likely provoked due to recent admission.  He is on Plavix 75mg  daily. Stop aspirin         Orders Placed This Encounter  Procedures   CBC with Differential (Cancer Center Only)    Standing Status:   Future    Expected Date:   10/20/2023    Expiration Date:   10/19/2024   CMP (Cancer Center only)    Standing Status:   Future    Expected Date:   10/20/2023    Expiration Date:   10/19/2024   CBC with Differential (Cancer Center Only)    Standing Status:    Future    Expected Date:   11/03/2023    Expiration Date:   11/02/2024   CMP (Cancer Center only)    Standing Status:   Future    Expected Date:   11/03/2023    Expiration Date:   11/02/2024   Follow up  1 week Lab MD All questions were answered. The patient knows to call the clinic with any problems, questions or concerns.  [name], MD, PhD [facility] Hematology Oncology 09/22/2023    HISTORY OF PRESENTING ILLNESS:  [name] 63 y.o. male presents to establish care for lung mass I have reviewed his chart and materials related to his cancer extensively and collaborated history with the patient. Summary of oncologic history is as follows:  12/08/2021 CT chest with contrast showed irregular solid pulmonary nodule of right upper lobe, increased in size.  Bibasilar consolidation.  Mildly enlarged right lower paratracheal lymph node.  Atherosclerosis. 01/04/2022 PET scan showed 10 mm irregular nodule in the lateral right upper lobe, suspicious for primary bronchogenic neoplasm. 2 small right paratracheal nodes, indeterminate and favored to be reactive.  Attention on follow-up.  Patient was seen by radiation oncology Dr. Rushie Chestnut.  Patient was offered SBRT to the right upper lobe nodule/presumed non-small cell lung cancer stage I.  Patient lives in a group home.  He reports chronic cough.  Some shortness of breath with exertion. He is a current everyday smoker.  Few cigarettes per day.  He denies any unintentional weight loss, night sweats, fever or chills. Patient  is on aspirin and Plavix   INTERVAL HISTORY [name] is a 63 y.o. male who has above history reviewed by me today presents for follow up visit for lung cancer.  Oncology History  Adenocarcinoma of left lung (HCC)  12/29/2022 Imaging   CT chest with contrast showed Stable radiation changes involving the right upper lobe with a small residual treated nodule.  Decrease in size.  Interval enlargement of the left upper lobe  pulmonary nodule.  Stable mediastinal nodes stable advanced emphysematous changes and areas of pulmonary scarring.  Stable bilateral adrenal gland adenomas.  Emphysema   01/30/2023 Imaging   PET 1. Recurrent bronchogenic carcinoma in the left upper lobe with new and increasingly hypermetabolic mediastinal lymph nodes. No evidence of distant metastatic disease. 2. Possible mild prostate hypermetabolism. Consider laboratory correlation. 3. Liver is mildly heterogeneous, raising suspicion for steatosis. 4. Cholelithiasis. 5. Right adrenal adenoma.  Left adrenal myelolipoma. 6. Aortic atherosclerosis (ICD10-I70.0). Coronary artery calcification. 7. Enlarged pulmonic trunk, indicative of pulmonary arterial hypertension.   03/15/2023 Initial Diagnosis   Adenocarcinoma of left lung   Bronchoscopy showed  Lung, biopsy, Left upper lobe - NON-SMALL CELL CARCINOMA, FAVOR ADENOCARCINOMA.  1. Bronchial Lavage, Left upper lobe - POSITIVE FOR MALIGNANCY. - NON-SMALL CELL CARCINOMA, FAVOR ADENOCARCINOMA. - SEE NOTE. 2. Bronchial Brushing, Left upper lobe - POSITIVE FOR MALIGNANCY. - NON-SMALL CELL CARCINOMA, FAVOR ADENOCARCINOMA. 3. Bronchus, biopsy, left upper lobe - SEE [id number] 4. Fine Needle Aspiration, left upper lobe - POSITIVE FOR MALIGNANCY. - NON-SMALL CELL CARCINOMA, FAVOR ADENOCARCINOMA. 5. Fine Needle Aspiration, station 7 lymph node - ATYPICAL. - BACKGROUND LYMPHOID TISSUE IS PRESENT CONSISTENT WITH LYMPH NODE SAMPLING.    03/15/2023 Procedure   Fiberoptic bronchoscopy with airway inspection and BAL Procedure findings:   Bronchoscope was inserted via ETT  without difficulty.  Posterior oropharynx, epiglottis, arytenoids, false cords and vocal cords were not visualized as these were bypassed by endotracheal tube. The distal trachea was normal in circumference and appearance without mucosal, cartilaginous or branching abnormalities.  The main carina was mildly splayed . All right  and left lobar airways were visualized to the Subsegmental level.  Sub- sub segmental carinae were identified in all the distal airways.   Secretions were visible in the following airways and appeared to be clear.  The mucosa was : friable at LEFT UPPER LOBE   Airways were notable for:        exophytic lesions :n       extrinsic compression in the following distributions: n.       Friable mucosa: y       Teacher, music /pigmentation: n   MUCUS PLUGGING WAS SEVERE ON LEFT SIDE AND COMPLETE OBSTRUCTED MULTIPLE AIRWAYS.  UNABLE TO NAVIGATE THROUGH TRACHEOBRONCHIAL TREE DUE TO SEVERE MUCUS PLUGGING. THERAPEUTIC ASPIRATION WAS PERFORMED X 7 AND WAS DONE BILATERALLY ALTHOUGH WORSE ON LEFT.  BAL WAS PERFORMED AT LEFT UPPER LOBE X 2.     Endobronchial ultrasound assisted hilar and mediastinal lymph node biopsies procedure findings: The fiberoptic bronchoscope was removed and the EBUS scope was introduced. Examination began to evaluate for pathologically enlarged lymph nodes starting on the RIGHT  side progressing to the LEFT side.  All lymph node biopsies performed with 21G  needle. Lymph node biopsies were sent in cytolite for all stations.   STATION 10R - 6mm not biopsied STATION 7 - 1.2cm biopsied 3 times STATION 10L - 5mm not biopsied  STATION 4L - 6mm not biopsied    03/22/2023 Cancer  Staging   Staging form: Lung, AJCC 8th Edition - Clinical stage from 03/22/2023: cT1, cN3, cM0 - Signed by [name], MD on 03/22/2023 Stage prefix: Initial diagnosis   04/20/2023 - 06/08/2023 Chemotherapy   Patient is on Treatment Plan : LUNG Carboplatin + Paclitaxel + XRT q7d     09/01/2023 -  Chemotherapy   Patient is on Treatment Plan : LUNG Durvalumab (10) q14d        Patient presents for evaluation prior to chemotherapy. He reports feel well Today he reports doing well. Tolerated durvalumab Chronic cough and SOB, now worse. He reports being compliant with Eliquis.  No bleeding events, dark stool.       MEDICAL HISTORY:  Past Medical History:  Diagnosis Date   Acute on chronic respiratory failure with hypoxia (HCC)    Asthma    Coagulation disorder (HCC)    COPD (chronic obstructive pulmonary disease) (HCC)    Depression    History of hiatal hernia    Hypertension    Hypokalemia    Leukocytosis    Lung mass    Normocytic anemia 07/13/2023   Pneumonia    Schizophrenia (HCC)    Shortness of breath dyspnea    Stroke [facility])    Umbilical hernia    Ventral hernia     SURGICAL HISTORY: Past Surgical History:  Procedure Laterality Date   COLONOSCOPY WITH PROPOFOL N/A 09/21/2021   Procedure: COLONOSCOPY WITH PROPOFOL;  Surgeon: Regis Bill, MD;  Location: [facility] ENDOSCOPY;  Service: Endoscopy;  Laterality: N/A;   HERNIA REPAIR     INSERTION OF MESH N/A 12/18/2014   Procedure: INSERTION OF MESH;  Surgeon: Lattie Haw, MD;  Location: [facility] ORS;  Service: General;  Laterality: N/A;   SUPRA-UMBILICAL HERNIA  12/18/2014   Procedure: SUPRA-UMBILICAL HERNIA;  Surgeon: Lattie Haw, MD;  Location: [facility] ORS;  Service: General;;   UMBILICAL HERNIA REPAIR N/A 12/18/2014   Procedure: HERNIA REPAIR UMBILICAL ADULT;  Surgeon: Lattie Haw, MD;  Location: [facility] ORS;  Service: General;  Laterality: N/A;   VIDEO BRONCHOSCOPY WITH ENDOBRONCHIAL ULTRASOUND N/A 03/15/2023   Procedure: VIDEO BRONCHOSCOPY WITH ENDOBRONCHIAL ULTRASOUND;  Surgeon: Vida Rigger, MD;  Location: [facility] ORS;  Service: Thoracic;  Laterality: N/A;    SOCIAL HISTORY: Social History   Socioeconomic History   Marital status: Widowed    Spouse name: Not on file   Number of children: Not on file   Years of education: Not on file   Highest education level: Not on file  Occupational History   Not on file  Tobacco Use   Smoking status: Every Day    Current packs/day: 0.15    Average packs/day: 0.2 packs/day for 45.0 years (6.8 ttl pk-yrs)    Types: Cigarettes   Smokeless tobacco: Never  Vaping Use    Vaping status: Never Used  Substance and Sexual Activity   Alcohol use: Not Currently    Alcohol/week: 75.0 standard drinks of alcohol    Types: 75 Cans of beer per week   Drug use: Not Currently    Types: Marijuana, "Crack" cocaine    Comment: none in 15 yrs   Sexual activity: Not Currently  Other Topics Concern   Not on file  Social History Narrative   Not on file   Social Drivers of Health   Financial Resource Strain: Medium Risk (08/18/2023)   Received from [facility]   Overall Financial Resource Strain (CARDIA)    Difficulty of Paying Living Expenses:  Somewhat hard  Food Insecurity: Patient Unable To Answer (08/18/2023)   Received from [facility]   Hunger Vital Sign    Worried About Running Out of Food in the Last Year: Patient unable to answer    Ran Out of Food in the Last Year: Patient unable to answer  Transportation Needs: Patient Unable To Answer (08/18/2023)   Received from [facility] - Transportation    In the past 12 months, has lack of transportation kept you from medical appointments or from getting medications?: Patient unable to answer    Lack of Transportation (Non-Medical): Patient unable to answer  Physical Activity: Not on file  Stress: Not on file  Social Connections: Not on file  Intimate Partner Violence: Not At Risk (06/16/2023)   Humiliation, Afraid, Rape, and Kick questionnaire    Fear of Current or Ex-Partner: No    Emotionally Abused: No    Physically Abused: No    Sexually Abused: No    FAMILY HISTORY: Family History  Problem Relation Age of Onset   Asthma Mother    Hypertension Father     ALLERGIES:  has no known allergies.  MEDICATIONS:  Current Outpatient Medications  Medication Sig Dispense Refill   albuterol (VENTOLIN HFA) 108 (90 Base) MCG/ACT inhaler Inhale 1-2 puffs into the lungs every 6 (six) hours as needed for wheezing or shortness of breath. 8 g 0   apixaban  (ELIQUIS) 5 MG TABS tablet Take 1 tablet (5 mg total) by mouth 2 (two) times daily. 60 tablet 3   APIXABAN (ELIQUIS) VTE STARTER PACK (10MG  AND 5MG ) Take as directed on package: start with two-5mg  tablets twice daily for 7 days. On day 8, switch to one-5mg  tablet twice daily. 74 each 0   clopidogrel (PLAVIX) 75 MG tablet Take 1 tablet (75 mg total) by mouth daily. 30 tablet 0   diltiazem (CARDIZEM CD) 240 MG 24 hr capsule Take 1 capsule (240 mg total) by mouth daily. 30 capsule 3   famotidine (PEPCID) 20 MG tablet Take 1 tablet (20 mg total) by mouth daily. 30 tablet 0   FLUoxetine (PROZAC) 20 MG capsule Take 1 capsule (20 mg total) by mouth daily. 30 capsule 3   Fluticasone-Umeclidin-Vilant (TRELEGY ELLIPTA) 100-62.5-25 MCG/ACT AEPB Inhale 1 puff into the lungs daily. 28 each 1   guaiFENesin (MUCINEX) 600 MG 12 hr tablet Take 1 tablet (600 mg total) by mouth 2 (two) times daily. 30 tablet 1   ipratropium-albuterol (DUONEB) 0.5-2.5 (3) MG/3ML SOLN Take 3 mLs by nebulization 3 (three) times daily. 360 mL 1   mirtazapine (REMERON) 7.5 MG tablet Take 1 tablet (7.5 mg total) by mouth at bedtime. 30 tablet 5   montelukast (SINGULAIR) 10 MG tablet Take 1 tablet (10 mg total) by mouth at bedtime. 30 tablet 1   potassium chloride (KLOR-CON M) 10 MEQ tablet Take 1 tablet (10 mEq total) by mouth daily. 30 tablet 1   pravastatin (PRAVACHOL) 20 MG tablet Take 1 tablet (20 mg total) by mouth daily at 6 PM. 30 tablet 0   QUEtiapine (SEROQUEL) 300 MG tablet Take 2 tablets (600 mg total) by mouth at bedtime. 60 tablet 0   senna-docusate (SENOKOT-S) 8.6-50 MG tablet Take 1 tablet by mouth daily after breakfast. 30 tablet 0   traZODone (DESYREL) 100 MG tablet Take 2 tablets (200 mg total) by mouth at bedtime as needed for sleep. 30 tablet 0   umeclidinium bromide (INCRUSE ELLIPTA) 62.5 MCG/ACT  AEPB Inhale 1 puff into the lungs daily. 7 each 1   No current facility-administered medications for this visit.     Review of Systems  Constitutional:  Negative for appetite change, chills, fatigue, fever and unexpected weight change.  HENT:   Negative for hearing loss and voice change.   Eyes:  Negative for eye problems and icterus.  Respiratory:  Positive for cough. Negative for chest tightness and hemoptysis.   Cardiovascular:  Negative for chest pain and leg swelling.  Gastrointestinal:  Negative for abdominal distention and abdominal pain.  Endocrine: Negative for hot flashes.  Genitourinary:  Negative for difficulty urinating, dysuria and frequency.   Musculoskeletal:  Negative for arthralgias.  Skin:  Negative for itching and rash.  Neurological:  Negative for light-headedness and numbness.  Hematological:  Negative for adenopathy. Does not bruise/bleed easily.  Psychiatric/Behavioral:  Negative for confusion.      PHYSICAL EXAMINATION: ECOG PERFORMANCE STATUS: 0 - Asymptomatic  Vitals:   09/22/23 0850  BP: 126/82  Pulse: 95  Resp: 18  Temp: (!) 97.2 F (36.2 C)  SpO2: 96%   Filed Weights   09/22/23 0850  Weight: 231 lb 3.2 oz (104.9 kg)    Physical Exam Constitutional:      General: He is not in acute distress.    Appearance: He is not diaphoretic.  HENT:     Head: Normocephalic and atraumatic.  Eyes:     General: No scleral icterus. Cardiovascular:     Rate and Rhythm: Normal rate and regular rhythm.  Pulmonary:     Effort: Pulmonary effort is normal. No respiratory distress.     Breath sounds: No wheezing.     Comments: Decreased breath sound bilaterally, poor air entry Abdominal:     General: There is no distension.     Palpations: Abdomen is soft.     Tenderness: There is no abdominal tenderness.  Musculoskeletal:        General: Normal range of motion.     Cervical back: Normal range of motion and neck supple.  Skin:    General: Skin is warm and dry.     Findings: No erythema.  Neurological:     Mental Status: He is alert and oriented to person, place,  and time. Mental status is at baseline.     Motor: No abnormal muscle tone.  Psychiatric:        Mood and Affect: Affect normal.     Comments: Flat      LABORATORY DATA:  I have reviewed the data as listed    Latest Ref Rng & Units 09/22/2023    8:14 AM 09/08/2023    8:17 AM 09/01/2023    7:56 AM  CBC  WBC 4.0 - 10.5 K/uL 4.9  4.9  5.4   Hemoglobin 13.0 - 17.0 g/dL 65.7  84.6  96.2   Hematocrit 39.0 - 52.0 % 35.1  33.5  36.3   Platelets 150 - 400 K/uL 178  213  181       Latest Ref Rng & Units 09/22/2023    8:14 AM 09/08/2023    8:17 AM 09/01/2023    7:56 AM  CMP  Glucose 70 - 99 mg/dL 952  841  324   BUN 8 - 23 mg/dL 7  6  6    Creatinine 0.61 - 1.24 mg/dL 4.01  0.27  2.53   Sodium 135 - 145 mmol/L 137  137  140   Potassium 3.5 - 5.1 mmol/L 3.4  3.5  3.5   Chloride 98 - 111 mmol/L 104  103  104   CO2 22 - 32 mmol/L 23  26  27    Calcium 8.9 - 10.3 mg/dL 9.2  9.1  9.2   Total Protein 6.5 - 8.1 g/dL 7.1  7.1  6.7   Total Bilirubin 0.0 - 1.2 mg/dL 0.5  0.5  0.5   Alkaline Phos 38 - 126 U/L 68  62  57   AST 15 - 41 U/L 16  15  22    ALT 0 - 44 U/L 10  9  9       RADIOGRAPHIC STUDIES: I have personally reviewed the radiological images as listed and agreed with the findings in the report. No results found.

## 2023-09-22 NOTE — Assessment & Plan Note (Signed)
Recommend Elqiuis 5mg  BID. Likely provoked due to recent admission.  He is on Plavix 75mg  daily. Stop aspirin

## 2023-09-22 NOTE — Assessment & Plan Note (Signed)
Immunotherapy plan as planned.

## 2023-09-22 NOTE — Progress Notes (Signed)
Pt went to bathroom and when he returned iv had infiltrated. Dr Gwenyth Ober NP and Sharia Reeve NP made aware. Josh evaluated and the swelling had decreased by half by the time he got to chair side. Pt educated on use of heat/ice for discomfort and encouraged to call us with any side effects. Pt stable at discharge

## 2023-09-22 NOTE — Assessment & Plan Note (Signed)
Hb has improved, iron panel showed adequate iron store.   Marland Kitchen

## 2023-09-22 NOTE — Assessment & Plan Note (Signed)
Chronically low, continue  potassium chloride daily,

## 2023-09-25 ENCOUNTER — Ambulatory Visit: Payer: Medicare Other | Admitting: Radiation Oncology

## 2023-09-29 ENCOUNTER — Ambulatory Visit: Payer: 59 | Admitting: Oncology

## 2023-09-29 ENCOUNTER — Ambulatory Visit: Payer: 59

## 2023-09-29 ENCOUNTER — Other Ambulatory Visit: Payer: 59

## 2023-10-06 ENCOUNTER — Encounter: Payer: Self-pay | Admitting: Oncology

## 2023-10-06 ENCOUNTER — Inpatient Hospital Stay: Payer: 59

## 2023-10-06 ENCOUNTER — Inpatient Hospital Stay (HOSPITAL_BASED_OUTPATIENT_CLINIC_OR_DEPARTMENT_OTHER): Payer: 59 | Admitting: Oncology

## 2023-10-06 VITALS — BP 119/81 | HR 101 | Temp 97.0°F | Resp 18 | Wt 231.6 lb

## 2023-10-06 VITALS — BP 138/89 | HR 90

## 2023-10-06 DIAGNOSIS — C3492 Malignant neoplasm of unspecified part of left bronchus or lung: Secondary | ICD-10-CM

## 2023-10-06 DIAGNOSIS — Z5112 Encounter for antineoplastic immunotherapy: Secondary | ICD-10-CM | POA: Diagnosis not present

## 2023-10-06 DIAGNOSIS — J449 Chronic obstructive pulmonary disease, unspecified: Secondary | ICD-10-CM | POA: Diagnosis not present

## 2023-10-06 DIAGNOSIS — I2699 Other pulmonary embolism without acute cor pulmonale: Secondary | ICD-10-CM

## 2023-10-06 DIAGNOSIS — C3412 Malignant neoplasm of upper lobe, left bronchus or lung: Secondary | ICD-10-CM | POA: Diagnosis not present

## 2023-10-06 DIAGNOSIS — E876 Hypokalemia: Secondary | ICD-10-CM

## 2023-10-06 LAB — CBC WITH DIFFERENTIAL (CANCER CENTER ONLY)
Abs Immature Granulocytes: 0.02 10*3/uL (ref 0.00–0.07)
Basophils Absolute: 0 10*3/uL (ref 0.0–0.1)
Basophils Relative: 0 %
Eosinophils Absolute: 0.1 10*3/uL (ref 0.0–0.5)
Eosinophils Relative: 3 %
HCT: 35.8 % — ABNORMAL LOW (ref 39.0–52.0)
Hemoglobin: 11.6 g/dL — ABNORMAL LOW (ref 13.0–17.0)
Immature Granulocytes: 0 %
Lymphocytes Relative: 21 %
Lymphs Abs: 1.1 10*3/uL (ref 0.7–4.0)
MCH: 28.2 pg (ref 26.0–34.0)
MCHC: 32.4 g/dL (ref 30.0–36.0)
MCV: 86.9 fL (ref 80.0–100.0)
Monocytes Absolute: 0.4 10*3/uL (ref 0.1–1.0)
Monocytes Relative: 8 %
Neutro Abs: 3.5 10*3/uL (ref 1.7–7.7)
Neutrophils Relative %: 68 %
Platelet Count: 188 10*3/uL (ref 150–400)
RBC: 4.12 MIL/uL — ABNORMAL LOW (ref 4.22–5.81)
RDW: 15.4 % (ref 11.5–15.5)
WBC Count: 5.2 10*3/uL (ref 4.0–10.5)
nRBC: 0 % (ref 0.0–0.2)

## 2023-10-06 LAB — CMP (CANCER CENTER ONLY)
ALT: 11 U/L (ref 0–44)
AST: 17 U/L (ref 15–41)
Albumin: 3.8 g/dL (ref 3.5–5.0)
Alkaline Phosphatase: 70 U/L (ref 38–126)
Anion gap: 9 (ref 5–15)
BUN: 7 mg/dL — ABNORMAL LOW (ref 8–23)
CO2: 24 mmol/L (ref 22–32)
Calcium: 9.2 mg/dL (ref 8.9–10.3)
Chloride: 104 mmol/L (ref 98–111)
Creatinine: 0.66 mg/dL (ref 0.61–1.24)
GFR, Estimated: >60 mL/min (ref >60)
Glucose, Bld: 121 mg/dL — ABNORMAL HIGH (ref 70–99)
Potassium: 3.5 mmol/L (ref 3.5–5.1)
Sodium: 137 mmol/L (ref 135–145)
Total Bilirubin: 0.4 mg/dL (ref 0.0–1.2)
Total Protein: 7.2 g/dL (ref 6.5–8.1)

## 2023-10-06 LAB — TSH: TSH: 0.154 u[IU]/mL — ABNORMAL LOW (ref 0.350–4.500)

## 2023-10-06 MED ORDER — SODIUM CHLORIDE 0.9 % IV SOLN
10.0000 mg/kg | Freq: Once | INTRAVENOUS | Status: AC
  Administered 2023-10-06: 1000 mg via INTRAVENOUS
  Filled 2023-10-06: qty 20

## 2023-10-06 MED ORDER — SODIUM CHLORIDE 0.9 % IV SOLN
INTRAVENOUS | Status: DC
  Filled 2023-10-06: qty 250

## 2023-10-06 NOTE — Progress Notes (Signed)
 Hematology/Oncology Progress note Telephone:(336) [phone] Fax:(336) [phone]      CHIEF COMPLAINTS/PURPOSE OF CONSULTATION:  Stage III Lung cancer  ASSESSMENT & PLAN:   Cancer Staging  Adenocarcinoma of left lung [facility]) Staging form: Lung, AJCC 8th Edition - Clinical stage from 03/22/2023: cT1, cN3, cM0 - Signed by [name], MD on 03/22/2023   Adenocarcinoma of left lung (HCC) Clinically he has Stage III left lung cancer He has right paratracheal lymph node hypermetabolic activity, although Station 7 lymph node showed atypical cell, no definite malignancy.  Left upper lobe adenocarcinoma, No enough tissue for NGS, will send off liquid biopsy- send off liquid biopsy.  S/p concurrent chemotherapy radiation. -carboplatin AUC 2 and taxol 45mg /m2   Clinically he is doing well today Labs are reviewed and discussed with patient. Proceed with Durvalumab maintenance.     Pulmonary embolism (HCC) Recommend Elqiuis 5mg  BID. Likely provoked  He is on Plavix 75mg  daily. Stop aspirin      COPD (chronic obstructive pulmonary disease) (HCC) Continue albuterol inhaler PRN, Duonebs.  follow up with pulmonology Dr. Meredeth Ide.  Continue Singulair, mucinex.   Encounter for antineoplastic immunotherapy Immunotherapy plan as planned.   Hypokalemia Chronically low, continue  potassium chloride daily,       Orders Placed This Encounter  Procedures   CT CHEST ABDOMEN PELVIS W CONTRAST    Standing Status:   Future    Expected Date:   10/26/2023    Expiration Date:   10/05/2024    If indicated for the ordered procedure, I authorize the administration of contrast media per Radiology protocol:   Yes    Does the patient have a contrast media/X-ray dye allergy?:   No    Preferred imaging location?:   [facility]    If indicated for the ordered procedure, I authorize the administration of oral contrast media per Radiology protocol:   Yes   Follow up  1 week Lab MD All questions were  answered. The patient knows to call the clinic with any problems, questions or concerns.  [name], MD, PhD [facility] Hematology Oncology 10/06/2023    HISTORY OF PRESENTING ILLNESS:  [name] 63 y.o. male presents to establish care for lung mass I have reviewed his chart and materials related to his cancer extensively and collaborated history with the patient. Summary of oncologic history is as follows:  12/08/2021 CT chest with contrast showed irregular solid pulmonary nodule of right upper lobe, increased in size.  Bibasilar consolidation.  Mildly enlarged right lower paratracheal lymph node.  Atherosclerosis. 01/04/2022 PET scan showed 10 mm irregular nodule in the lateral right upper lobe, suspicious for primary bronchogenic neoplasm. 2 small right paratracheal nodes, indeterminate and favored to be reactive.  Attention on follow-up.  Patient was seen by radiation oncology Dr. Rushie Chestnut.  Patient was offered SBRT to the right upper lobe nodule/presumed non-small cell lung cancer stage I.  Patient lives in a group home.  He reports chronic cough.  Some shortness of breath with exertion. He is a current everyday smoker.  Few cigarettes per day.  He denies any unintentional weight loss, night sweats, fever or chills. Patient is on aspirin and Plavix   INTERVAL HISTORY [name] is a 63 y.o. male who has above history reviewed by me today presents for follow up visit for lung cancer.  Oncology History  Adenocarcinoma of left lung (HCC)  12/29/2022 Imaging   CT chest with contrast showed Stable radiation changes involving the right upper  lobe with a small residual treated nodule.  Decrease in size.  Interval enlargement of the left upper lobe pulmonary nodule.  Stable mediastinal nodes stable advanced emphysematous changes and areas of pulmonary scarring.  Stable bilateral adrenal gland adenomas.  Emphysema   01/30/2023 Imaging   PET 1. Recurrent bronchogenic carcinoma in the left  upper lobe with new and increasingly hypermetabolic mediastinal lymph nodes. No evidence of distant metastatic disease. 2. Possible mild prostate hypermetabolism. Consider laboratory correlation. 3. Liver is mildly heterogeneous, raising suspicion for steatosis. 4. Cholelithiasis. 5. Right adrenal adenoma.  Left adrenal myelolipoma. 6. Aortic atherosclerosis (ICD10-I70.0). Coronary artery calcification. 7. Enlarged pulmonic trunk, indicative of pulmonary arterial hypertension.   03/15/2023 Initial Diagnosis   Adenocarcinoma of left lung   Bronchoscopy showed  Lung, biopsy, Left upper lobe - NON-SMALL CELL CARCINOMA, FAVOR ADENOCARCINOMA.  1. Bronchial Lavage, Left upper lobe - POSITIVE FOR MALIGNANCY. - NON-SMALL CELL CARCINOMA, FAVOR ADENOCARCINOMA. - SEE NOTE. 2. Bronchial Brushing, Left upper lobe - POSITIVE FOR MALIGNANCY. - NON-SMALL CELL CARCINOMA, FAVOR ADENOCARCINOMA. 3. Bronchus, biopsy, left upper lobe - SEE [id number] 4. Fine Needle Aspiration, left upper lobe - POSITIVE FOR MALIGNANCY. - NON-SMALL CELL CARCINOMA, FAVOR ADENOCARCINOMA. 5. Fine Needle Aspiration, station 7 lymph node - ATYPICAL. - BACKGROUND LYMPHOID TISSUE IS PRESENT CONSISTENT WITH LYMPH NODE SAMPLING.    03/15/2023 Procedure   Fiberoptic bronchoscopy with airway inspection and BAL Procedure findings:   Bronchoscope was inserted via ETT  without difficulty.  Posterior oropharynx, epiglottis, arytenoids, false cords and vocal cords were not visualized as these were bypassed by endotracheal tube. The distal trachea was normal in circumference and appearance without mucosal, cartilaginous or branching abnormalities.  The main carina was mildly splayed . All right and left lobar airways were visualized to the Subsegmental level.  Sub- sub segmental carinae were identified in all the distal airways.   Secretions were visible in the following airways and appeared to be clear.  The mucosa was : friable at  LEFT UPPER LOBE   Airways were notable for:        exophytic lesions :n       extrinsic compression in the following distributions: n.       Friable mucosa: y       Teacher, music /pigmentation: n   MUCUS PLUGGING WAS SEVERE ON LEFT SIDE AND COMPLETE OBSTRUCTED MULTIPLE AIRWAYS.  UNABLE TO NAVIGATE THROUGH TRACHEOBRONCHIAL TREE DUE TO SEVERE MUCUS PLUGGING. THERAPEUTIC ASPIRATION WAS PERFORMED X 7 AND WAS DONE BILATERALLY ALTHOUGH WORSE ON LEFT.  BAL WAS PERFORMED AT LEFT UPPER LOBE X 2.     Endobronchial ultrasound assisted hilar and mediastinal lymph node biopsies procedure findings: The fiberoptic bronchoscope was removed and the EBUS scope was introduced. Examination began to evaluate for pathologically enlarged lymph nodes starting on the RIGHT  side progressing to the LEFT side.  All lymph node biopsies performed with 21G  needle. Lymph node biopsies were sent in cytolite for all stations.   STATION 10R - 6mm not biopsied STATION 7 - 1.2cm biopsied 3 times STATION 10L - 5mm not biopsied  STATION 4L - 6mm not biopsied    03/22/2023 Cancer Staging   Staging form: Lung, AJCC 8th Edition - Clinical stage from 03/22/2023: cT1, cN3, cM0 - Signed by [name], MD on 03/22/2023 Stage prefix: Initial diagnosis   04/20/2023 - 06/08/2023 Chemotherapy   Patient is on Treatment Plan : LUNG Carboplatin + Paclitaxel + XRT q7d     09/01/2023 -  Chemotherapy  Patient is on Treatment Plan : LUNG Durvalumab (10) q14d        Patient presents for evaluation prior to chemotherapy. He reports feel well Today he reports doing well. Tolerated durvalumab Chronic cough and SOB, now worse. He reports being compliant with Eliquis.  No bleeding events, dark stool.      MEDICAL HISTORY:  Past Medical History:  Diagnosis Date   Acute on chronic respiratory failure with hypoxia (HCC)    Asthma    Coagulation disorder (HCC)    COPD (chronic obstructive pulmonary disease) (HCC)    Depression     History of hiatal hernia    Hypertension    Hypokalemia    Leukocytosis    Lung mass    Normocytic anemia 07/13/2023   Pneumonia    Schizophrenia (HCC)    Shortness of breath dyspnea    Stroke [facility])    Umbilical hernia    Ventral hernia     SURGICAL HISTORY: Past Surgical History:  Procedure Laterality Date   COLONOSCOPY WITH PROPOFOL N/A 09/21/2021   Procedure: COLONOSCOPY WITH PROPOFOL;  Surgeon: Regis Bill, MD;  Location: [facility] ENDOSCOPY;  Service: Endoscopy;  Laterality: N/A;   HERNIA REPAIR     INSERTION OF MESH N/A 12/18/2014   Procedure: INSERTION OF MESH;  Surgeon: Lattie Haw, MD;  Location: [facility] ORS;  Service: General;  Laterality: N/A;   SUPRA-UMBILICAL HERNIA  12/18/2014   Procedure: SUPRA-UMBILICAL HERNIA;  Surgeon: Lattie Haw, MD;  Location: [facility] ORS;  Service: General;;   UMBILICAL HERNIA REPAIR N/A 12/18/2014   Procedure: HERNIA REPAIR UMBILICAL ADULT;  Surgeon: Lattie Haw, MD;  Location: [facility] ORS;  Service: General;  Laterality: N/A;   VIDEO BRONCHOSCOPY WITH ENDOBRONCHIAL ULTRASOUND N/A 03/15/2023   Procedure: VIDEO BRONCHOSCOPY WITH ENDOBRONCHIAL ULTRASOUND;  Surgeon: Vida Rigger, MD;  Location: [facility] ORS;  Service: Thoracic;  Laterality: N/A;    SOCIAL HISTORY: Social History   Socioeconomic History   Marital status: Widowed    Spouse name: Not on file   Number of children: Not on file   Years of education: Not on file   Highest education level: Not on file  Occupational History   Not on file  Tobacco Use   Smoking status: Every Day    Current packs/day: 0.15    Average packs/day: 0.2 packs/day for 45.0 years (6.8 ttl pk-yrs)    Types: Cigarettes   Smokeless tobacco: Never  Vaping Use   Vaping status: Never Used  Substance and Sexual Activity   Alcohol use: Not Currently    Alcohol/week: 75.0 standard drinks of alcohol    Types: 75 Cans of beer per week   Drug use: Not Currently    Types: Marijuana, "Crack" cocaine     Comment: none in 15 yrs   Sexual activity: Not Currently  Other Topics Concern   Not on file  Social History Narrative   Not on file   Social Drivers of Health   Financial Resource Strain: Medium Risk (08/18/2023)   Received [facility]   Overall Financial Resource Strain (CARDIA)    Difficulty of Paying Living Expenses: Somewhat hard  Food Insecurity: Patient Unable To Answer (08/18/2023)   Received from [facility]   Hunger Vital Sign    Worried About Running Out of Food in the Last Year: Patient unable to answer    Ran Out of Food in the Last Year: Patient unable to answer  Transportation Needs: Patient Unable  To Answer (08/18/2023)   Received from [facility] - Transportation    In the past 12 months, has lack of transportation kept you from medical appointments or from getting medications?: Patient unable to answer    Lack of Transportation (Non-Medical): Patient unable to answer  Physical Activity: Not on file  Stress: Not on file  Social Connections: Not on file  Intimate Partner Violence: Not At Risk (06/16/2023)   Humiliation, Afraid, Rape, and Kick questionnaire    Fear of Current or Ex-Partner: No    Emotionally Abused: No    Physically Abused: No    Sexually Abused: No    FAMILY HISTORY: Family History  Problem Relation Age of Onset   Asthma Mother    Hypertension Father     ALLERGIES:  has no known allergies.  MEDICATIONS:  Current Outpatient Medications  Medication Sig Dispense Refill   albuterol (VENTOLIN HFA) 108 (90 Base) MCG/ACT inhaler Inhale 1-2 puffs into the lungs every 6 (six) hours as needed for wheezing or shortness of breath. 8 g 0   apixaban (ELIQUIS) 5 MG TABS tablet Take 1 tablet (5 mg total) by mouth 2 (two) times daily. 60 tablet 3   APIXABAN (ELIQUIS) VTE STARTER PACK (10MG  AND 5MG ) Take as directed on package: start with two-5mg  tablets twice daily for 7 days. On day 8,  switch to one-5mg  tablet twice daily. 74 each 0   clopidogrel (PLAVIX) 75 MG tablet Take 1 tablet (75 mg total) by mouth daily. 30 tablet 0   diltiazem (CARDIZEM CD) 240 MG 24 hr capsule Take 1 capsule (240 mg total) by mouth daily. 30 capsule 3   famotidine (PEPCID) 20 MG tablet Take 1 tablet (20 mg total) by mouth daily. 30 tablet 0   FLUoxetine (PROZAC) 20 MG capsule Take 1 capsule (20 mg total) by mouth daily. 30 capsule 3   Fluticasone-Umeclidin-Vilant (TRELEGY ELLIPTA) 100-62.5-25 MCG/ACT AEPB Inhale 1 puff into the lungs daily. 28 each 1   guaiFENesin (MUCINEX) 600 MG 12 hr tablet Take 1 tablet (600 mg total) by mouth 2 (two) times daily. 30 tablet 1   ipratropium-albuterol (DUONEB) 0.5-2.5 (3) MG/3ML SOLN Take 3 mLs by nebulization 3 (three) times daily. 360 mL 1   mirtazapine (REMERON) 7.5 MG tablet Take 1 tablet (7.5 mg total) by mouth at bedtime. 30 tablet 5   montelukast (SINGULAIR) 10 MG tablet Take 1 tablet (10 mg total) by mouth at bedtime. 30 tablet 1   potassium chloride (KLOR-CON M) 10 MEQ tablet Take 1 tablet (10 mEq total) by mouth daily. 30 tablet 1   pravastatin (PRAVACHOL) 20 MG tablet Take 1 tablet (20 mg total) by mouth daily at 6 PM. 30 tablet 0   QUEtiapine (SEROQUEL) 300 MG tablet Take 2 tablets (600 mg total) by mouth at bedtime. 60 tablet 0   senna-docusate (SENOKOT-S) 8.6-50 MG tablet Take 1 tablet by mouth daily after breakfast. 30 tablet 0   traZODone (DESYREL) 100 MG tablet Take 2 tablets (200 mg total) by mouth at bedtime as needed for sleep. 30 tablet 0   umeclidinium bromide (INCRUSE ELLIPTA) 62.5 MCG/ACT AEPB Inhale 1 puff into the lungs daily. 7 each 1   No current facility-administered medications for this visit.    Review of Systems  Constitutional:  Negative for appetite change, chills, fatigue, fever and unexpected weight change.  HENT:   Negative for hearing loss and voice change.   Eyes:  Negative for eye problems and  icterus.  Respiratory:   Positive for cough. Negative for chest tightness and hemoptysis.   Cardiovascular:  Negative for chest pain and leg swelling.  Gastrointestinal:  Negative for abdominal distention and abdominal pain.  Endocrine: Negative for hot flashes.  Genitourinary:  Negative for difficulty urinating, dysuria and frequency.   Musculoskeletal:  Negative for arthralgias.  Skin:  Negative for itching and rash.  Neurological:  Negative for light-headedness and numbness.  Hematological:  Negative for adenopathy. Does not bruise/bleed easily.  Psychiatric/Behavioral:  Negative for confusion.      PHYSICAL EXAMINATION: ECOG PERFORMANCE STATUS: 0 - Asymptomatic  Vitals:   10/06/23 0843  BP: 119/81  Pulse: (!) 101  Resp: 18  Temp: (!) 97 F (36.1 C)  SpO2: 94%   Filed Weights   10/06/23 0843  Weight: 231 lb 9.6 oz (105.1 kg)    Physical Exam Constitutional:      General: He is not in acute distress.    Appearance: He is not diaphoretic.  HENT:     Head: Normocephalic and atraumatic.  Eyes:     General: No scleral icterus. Cardiovascular:     Rate and Rhythm: Normal rate and regular rhythm.  Pulmonary:     Effort: Pulmonary effort is normal. No respiratory distress.     Breath sounds: No wheezing.     Comments: Decreased breath sound bilaterally, poor air entry Abdominal:     General: There is no distension.     Palpations: Abdomen is soft.     Tenderness: There is no abdominal tenderness.  Musculoskeletal:        General: Normal range of motion.     Cervical back: Normal range of motion and neck supple.  Skin:    General: Skin is warm and dry.     Findings: No erythema.  Neurological:     Mental Status: He is alert and oriented to person, place, and time. Mental status is at baseline.     Motor: No abnormal muscle tone.  Psychiatric:        Mood and Affect: Affect normal.     Comments: Flat      LABORATORY DATA:  I have reviewed the data as listed    Latest Ref Rng & Units  10/06/2023    8:02 AM 09/22/2023    8:14 AM 09/08/2023    8:17 AM  CBC  WBC 4.0 - 10.5 K/uL 5.2  4.9  4.9   Hemoglobin 13.0 - 17.0 g/dL 56.2  13.0  86.5   Hematocrit 39.0 - 52.0 % 35.8  35.1  33.5   Platelets 150 - 400 K/uL 188  178  213       Latest Ref Rng & Units 10/06/2023    8:02 AM 09/22/2023    8:14 AM 09/08/2023    8:17 AM  CMP  Glucose 70 - 99 mg/dL 784  696  295   BUN 8 - 23 mg/dL 7  7  6    Creatinine 0.61 - 1.24 mg/dL 2.84  1.32  4.40   Sodium 135 - 145 mmol/L 137  137  137   Potassium 3.5 - 5.1 mmol/L 3.5  3.4  3.5   Chloride 98 - 111 mmol/L 104  104  103   CO2 22 - 32 mmol/L 24  23  26    Calcium 8.9 - 10.3 mg/dL 9.2  9.2  9.1   Total Protein 6.5 - 8.1 g/dL 7.2  7.1  7.1   Total Bilirubin 0.0 - 1.2  mg/dL 0.4  0.5  0.5   Alkaline Phos 38 - 126 U/L 70  68  62   AST 15 - 41 U/L 17  16  15    ALT 0 - 44 U/L 11  10  9       RADIOGRAPHIC STUDIES: I have personally reviewed the radiological images as listed and agreed with the findings in the report. No results found.

## 2023-10-06 NOTE — Assessment & Plan Note (Signed)
 Chronically low, continue  potassium chloride daily,

## 2023-10-06 NOTE — Assessment & Plan Note (Signed)
 Continue albuterol inhaler PRN, Duonebs.  follow up with pulmonology Dr. Meredeth Ide.  Continue Singulair, mucinex.

## 2023-10-06 NOTE — Assessment & Plan Note (Addendum)
 Recommend Elqiuis 5mg  BID. Likely provoked  He is on Plavix 75mg  daily. Stop aspirin

## 2023-10-06 NOTE — Assessment & Plan Note (Addendum)
 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  Left upper lobe adenocarcinoma, No enough tissue for NGS, will send off liquid biopsy- send off liquid biopsy.  S/p concurrent chemotherapy radiation. -carboplatin AUC 2 and taxol 45mg /m2   Clinically he is doing well today Labs are reviewed and discussed with patient. Proceed with Durvalumab maintenance.

## 2023-10-06 NOTE — Patient Instructions (Signed)
 [facility] - A DEPT OF [facility]  Discharge Instructions: Thank you for choosing [facility] to provide your oncology and hematology care.  If you have a lab appointment with the Cancer Center, please go directly to the Cancer Center and check in at the registration area.  Wear comfortable clothing and clothing appropriate for easy access to any Portacath or PICC line.   We strive to give you quality time with your provider. You may need to reschedule your appointment if you arrive late (15 or more minutes).  Arriving late affects you and other patients whose appointments are after yours.  Also, if you miss three or more appointments without notifying the office, you may be dismissed from the clinic at the provider's discretion.      For prescription refill requests, have your pharmacy contact our office and allow 72 hours for refills to be completed.    Today you received the following chemotherapy and/or immunotherapy agents Imfinzi      To help prevent nausea and vomiting after your treatment, we encourage you to take your nausea medication as directed.  BELOW ARE SYMPTOMS THAT SHOULD BE REPORTED IMMEDIATELY: *FEVER GREATER THAN 100.4 F (38 C) OR HIGHER *CHILLS OR SWEATING *NAUSEA AND VOMITING THAT IS NOT CONTROLLED WITH YOUR NAUSEA MEDICATION *UNUSUAL SHORTNESS OF BREATH *UNUSUAL BRUISING OR BLEEDING *URINARY PROBLEMS (pain or burning when urinating, or frequent urination) *BOWEL PROBLEMS (unusual diarrhea, constipation, pain near the anus) TENDERNESS IN MOUTH AND THROAT WITH OR WITHOUT PRESENCE OF ULCERS (sore throat, sores in mouth, or a toothache) UNUSUAL RASH, SWELLING OR PAIN  UNUSUAL VAGINAL DISCHARGE OR ITCHING   Items with * indicate a potential emergency and should be followed up as soon as possible or go to the Emergency Department if any problems should occur.  Please show the CHEMOTHERAPY ALERT CARD or IMMUNOTHERAPY  ALERT CARD at check-in to the Emergency Department and triage nurse.  Should you have questions after your visit or need to cancel or reschedule your appointment, please contact [facility] - A DEPT OF [facility]  [phone] and follow the prompts.  Office hours are 8:00 a.m. to 4:30 p.m. Monday - Friday. Please note that voicemails left after 4:00 p.m. may not be returned until the following business day.  We are closed weekends and major holidays. You have access to a nurse at all times for urgent questions. Please call the main number to the clinic [phone] and follow the prompts.  For any non-urgent questions, you may also contact your provider using MyChart. We now offer e-Visits for anyone 1 and older to request care online for non-urgent symptoms. For details visit [email or web address].   Also download the MyChart app! Go to the app store, search "MyChart", open the app, select [place], and log in with your MyChart username and password.

## 2023-10-06 NOTE — Assessment & Plan Note (Signed)
 Immunotherapy plan as planned.

## 2023-10-07 LAB — T4: T4, Total: 6.6 ug/dL (ref 4.5–12.0)

## 2023-10-09 ENCOUNTER — Encounter: Payer: Self-pay | Admitting: Oncology

## 2023-10-15 ENCOUNTER — Inpatient Hospital Stay
Admission: EM | Admit: 2023-10-15 | Discharge: 2023-10-24 | DRG: 193 | Disposition: A | Discharge status: Home Health | Attending: Hospitalist | Admitting: Hospitalist

## 2023-10-15 ENCOUNTER — Other Ambulatory Visit: Payer: Self-pay

## 2023-10-15 ENCOUNTER — Emergency Department

## 2023-10-15 DIAGNOSIS — C3492 Malignant neoplasm of unspecified part of left bronchus or lung: Secondary | ICD-10-CM

## 2023-10-15 DIAGNOSIS — Z79899 Other long term (current) drug therapy: Secondary | ICD-10-CM

## 2023-10-15 DIAGNOSIS — Z7902 Long term (current) use of antithrombotics/antiplatelets: Secondary | ICD-10-CM

## 2023-10-15 DIAGNOSIS — J9621 Acute and chronic respiratory failure with hypoxia: Secondary | ICD-10-CM | POA: Diagnosis not present

## 2023-10-15 DIAGNOSIS — R739 Hyperglycemia, unspecified: Secondary | ICD-10-CM | POA: Diagnosis not present

## 2023-10-15 DIAGNOSIS — J101 Influenza due to other identified influenza virus with other respiratory manifestations: Secondary | ICD-10-CM | POA: Diagnosis not present

## 2023-10-15 DIAGNOSIS — Z9221 Personal history of antineoplastic chemotherapy: Secondary | ICD-10-CM

## 2023-10-15 DIAGNOSIS — J441 Chronic obstructive pulmonary disease with (acute) exacerbation: Principal | ICD-10-CM | POA: Diagnosis present

## 2023-10-15 DIAGNOSIS — Z1152 Encounter for screening for COVID-19: Secondary | ICD-10-CM

## 2023-10-15 DIAGNOSIS — I2699 Other pulmonary embolism without acute cor pulmonale: Secondary | ICD-10-CM | POA: Diagnosis present

## 2023-10-15 DIAGNOSIS — C3490 Malignant neoplasm of unspecified part of unspecified bronchus or lung: Secondary | ICD-10-CM | POA: Diagnosis present

## 2023-10-15 DIAGNOSIS — T380X5A Adverse effect of glucocorticoids and synthetic analogues, initial encounter: Secondary | ICD-10-CM | POA: Diagnosis present

## 2023-10-15 DIAGNOSIS — I10 Essential (primary) hypertension: Secondary | ICD-10-CM | POA: Diagnosis present

## 2023-10-15 DIAGNOSIS — R000 Tachycardia, unspecified: Secondary | ICD-10-CM | POA: Diagnosis present

## 2023-10-15 DIAGNOSIS — F209 Schizophrenia, unspecified: Secondary | ICD-10-CM | POA: Diagnosis present

## 2023-10-15 DIAGNOSIS — J9601 Acute respiratory failure with hypoxia: Secondary | ICD-10-CM | POA: Diagnosis not present

## 2023-10-15 DIAGNOSIS — Z85118 Personal history of other malignant neoplasm of bronchus and lung: Secondary | ICD-10-CM

## 2023-10-15 DIAGNOSIS — F32A Depression, unspecified: Secondary | ICD-10-CM | POA: Diagnosis present

## 2023-10-15 DIAGNOSIS — Z87891 Personal history of nicotine dependence: Secondary | ICD-10-CM

## 2023-10-15 DIAGNOSIS — J439 Emphysema, unspecified: Secondary | ICD-10-CM | POA: Diagnosis present

## 2023-10-15 DIAGNOSIS — G4700 Insomnia, unspecified: Secondary | ICD-10-CM | POA: Diagnosis present

## 2023-10-15 DIAGNOSIS — Z7901 Long term (current) use of anticoagulants: Secondary | ICD-10-CM

## 2023-10-15 DIAGNOSIS — Z8249 Family history of ischemic heart disease and other diseases of the circulatory system: Secondary | ICD-10-CM

## 2023-10-15 DIAGNOSIS — Z8673 Personal history of transient ischemic attack (TIA), and cerebral infarction without residual deficits: Secondary | ICD-10-CM

## 2023-10-15 DIAGNOSIS — Z923 Personal history of irradiation: Secondary | ICD-10-CM

## 2023-10-15 DIAGNOSIS — Z825 Family history of asthma and other chronic lower respiratory diseases: Secondary | ICD-10-CM

## 2023-10-15 DIAGNOSIS — Z86711 Personal history of pulmonary embolism: Secondary | ICD-10-CM

## 2023-10-15 LAB — RESP PANEL BY RT-PCR (RSV, FLU A&B, COVID)  RVPGX2
Influenza A by PCR: POSITIVE — AB
Influenza B by PCR: NEGATIVE
Resp Syncytial Virus by PCR: NEGATIVE
SARS Coronavirus 2 by RT PCR: NEGATIVE

## 2023-10-15 LAB — CBC WITH DIFFERENTIAL/PLATELET
Abs Immature Granulocytes: 0.01 10*3/uL (ref 0.00–0.07)
Basophils Absolute: 0 10*3/uL (ref 0.0–0.1)
Basophils Relative: 0 %
Eosinophils Absolute: 0 10*3/uL (ref 0.0–0.5)
Eosinophils Relative: 0 %
HCT: 35.4 % — ABNORMAL LOW (ref 39.0–52.0)
Hemoglobin: 11.4 g/dL — ABNORMAL LOW (ref 13.0–17.0)
Immature Granulocytes: 0 %
Lymphocytes Relative: 10 %
Lymphs Abs: 0.7 10*3/uL (ref 0.7–4.0)
MCH: 28.1 pg (ref 26.0–34.0)
MCHC: 32.2 g/dL (ref 30.0–36.0)
MCV: 87.4 fL (ref 80.0–100.0)
Monocytes Absolute: 0.5 10*3/uL (ref 0.1–1.0)
Monocytes Relative: 9 %
Neutro Abs: 5.1 10*3/uL (ref 1.7–7.7)
Neutrophils Relative %: 81 %
Platelets: 160 10*3/uL (ref 150–400)
RBC: 4.05 MIL/uL — ABNORMAL LOW (ref 4.22–5.81)
RDW: 15.9 % — ABNORMAL HIGH (ref 11.5–15.5)
WBC: 6.3 10*3/uL (ref 4.0–10.5)
nRBC: 0 % (ref 0.0–0.2)

## 2023-10-15 LAB — BLOOD GAS, VENOUS
Acid-Base Excess: 1.3 mmol/L (ref 0.0–2.0)
Bicarbonate: 27.2 mmol/L (ref 20.0–28.0)
O2 Saturation: 92.9 %
Patient temperature: 37
pCO2, Ven: 47 mmHg (ref 44–60)
pH, Ven: 7.37 (ref 7.25–7.43)
pO2, Ven: 69 mmHg — ABNORMAL HIGH (ref 32–45)

## 2023-10-15 LAB — COMPREHENSIVE METABOLIC PANEL
ALT: 15 U/L (ref 0–44)
AST: 42 U/L — ABNORMAL HIGH (ref 15–41)
Albumin: 3.7 g/dL (ref 3.5–5.0)
Alkaline Phosphatase: 53 U/L (ref 38–126)
Anion gap: 9 (ref 5–15)
BUN: 12 mg/dL (ref 8–23)
CO2: 26 mmol/L (ref 22–32)
Calcium: 9.1 mg/dL (ref 8.9–10.3)
Chloride: 100 mmol/L (ref 98–111)
Creatinine, Ser: 0.85 mg/dL (ref 0.61–1.24)
GFR, Estimated: >60 mL/min (ref >60)
Glucose, Bld: 141 mg/dL — ABNORMAL HIGH (ref 70–99)
Potassium: 3.8 mmol/L (ref 3.5–5.1)
Sodium: 135 mmol/L (ref 135–145)
Total Bilirubin: 0.3 mg/dL (ref 0.0–1.2)
Total Protein: 7.5 g/dL (ref 6.5–8.1)

## 2023-10-15 LAB — PROTIME-INR
INR: 1.8 — ABNORMAL HIGH (ref 0.8–1.2)
Prothrombin Time: 21.2 s — ABNORMAL HIGH (ref 11.4–15.2)

## 2023-10-15 LAB — HEMOGLOBIN A1C
Hgb A1c MFr Bld: 5.6 % (ref 4.8–5.6)
Mean Plasma Glucose: 114.02 mg/dL

## 2023-10-15 LAB — LACTIC ACID, PLASMA: Lactic Acid, Venous: 1.7 mmol/L (ref 0.5–1.9)

## 2023-10-15 LAB — MRSA NEXT GEN BY PCR, NASAL: MRSA by PCR Next Gen: NOT DETECTED

## 2023-10-15 MED ORDER — APIXABAN 5 MG PO TABS
5.0000 mg | ORAL_TABLET | Freq: Two times a day (BID) | ORAL | Status: DC
  Administered 2023-10-15 – 2023-10-24 (×18): 5 mg via ORAL
  Filled 2023-10-15 (×18): qty 1

## 2023-10-15 MED ORDER — ONDANSETRON HCL 4 MG/2ML IJ SOLN
4.0000 mg | Freq: Four times a day (QID) | INTRAMUSCULAR | Status: DC | PRN
  Administered 2023-10-16 (×2): 4 mg via INTRAVENOUS
  Filled 2023-10-15 (×2): qty 2

## 2023-10-15 MED ORDER — DM-GUAIFENESIN ER 30-600 MG PO TB12
1.0000 | ORAL_TABLET | Freq: Two times a day (BID) | ORAL | Status: DC
  Administered 2023-10-15 – 2023-10-24 (×18): 1 via ORAL
  Filled 2023-10-15 (×18): qty 1

## 2023-10-15 MED ORDER — FLUTICASONE FUROATE-VILANTEROL 100-25 MCG/ACT IN AEPB
1.0000 | INHALATION_SPRAY | Freq: Every day | RESPIRATORY_TRACT | Status: DC
  Administered 2023-10-16 – 2023-10-24 (×9): 1 via RESPIRATORY_TRACT
  Filled 2023-10-15 (×2): qty 28

## 2023-10-15 MED ORDER — METHYLPREDNISOLONE SODIUM SUCC 40 MG IJ SOLR
40.0000 mg | Freq: Two times a day (BID) | INTRAMUSCULAR | Status: AC
  Administered 2023-10-15 – 2023-10-16 (×2): 40 mg via INTRAVENOUS
  Filled 2023-10-15 (×2): qty 1

## 2023-10-15 MED ORDER — SODIUM CHLORIDE 0.9 % IV SOLN
500.0000 mg | INTRAVENOUS | Status: AC
  Administered 2023-10-15 – 2023-10-17 (×3): 500 mg via INTRAVENOUS
  Filled 2023-10-15 (×3): qty 5

## 2023-10-15 MED ORDER — ACETAMINOPHEN 325 MG PO TABS
650.0000 mg | ORAL_TABLET | Freq: Four times a day (QID) | ORAL | Status: DC | PRN
  Administered 2023-10-15: 650 mg via ORAL
  Filled 2023-10-15: qty 2

## 2023-10-15 MED ORDER — ACETAMINOPHEN 650 MG RE SUPP: 650.0000 mg | Freq: Four times a day (QID) | RECTAL | Status: DC | PRN

## 2023-10-15 MED ORDER — FLUOXETINE HCL 20 MG PO CAPS
20.0000 mg | ORAL_CAPSULE | Freq: Every day | ORAL | Status: DC
  Administered 2023-10-15 – 2023-10-24 (×10): 20 mg via ORAL
  Filled 2023-10-15 (×10): qty 1

## 2023-10-15 MED ORDER — PRAVASTATIN SODIUM 20 MG PO TABS
20.0000 mg | ORAL_TABLET | Freq: Every day | ORAL | Status: DC
  Administered 2023-10-15 – 2023-10-23 (×9): 20 mg via ORAL
  Filled 2023-10-15 (×9): qty 1

## 2023-10-15 MED ORDER — SODIUM CHLORIDE 0.9% FLUSH
3.0000 mL | Freq: Two times a day (BID) | INTRAVENOUS | Status: DC
  Administered 2023-10-15 – 2023-10-23 (×16): 3 mL via INTRAVENOUS

## 2023-10-15 MED ORDER — IPRATROPIUM-ALBUTEROL 0.5-2.5 (3) MG/3ML IN SOLN
3.0000 mL | RESPIRATORY_TRACT | Status: DC
  Administered 2023-10-15 – 2023-10-17 (×14): 3 mL via RESPIRATORY_TRACT
  Filled 2023-10-15 (×14): qty 3

## 2023-10-15 MED ORDER — FAMOTIDINE 20 MG PO TABS
20.0000 mg | ORAL_TABLET | Freq: Every day | ORAL | Status: DC
  Administered 2023-10-15 – 2023-10-24 (×10): 20 mg via ORAL
  Filled 2023-10-15 (×10): qty 1

## 2023-10-15 MED ORDER — CLOPIDOGREL BISULFATE 75 MG PO TABS
75.0000 mg | ORAL_TABLET | Freq: Every day | ORAL | Status: DC
  Administered 2023-10-16 – 2023-10-24 (×9): 75 mg via ORAL
  Filled 2023-10-15 (×9): qty 1

## 2023-10-15 MED ORDER — TRAZODONE HCL 100 MG PO TABS
200.0000 mg | ORAL_TABLET | Freq: Every evening | ORAL | Status: DC | PRN
  Administered 2023-10-15 – 2023-10-23 (×7): 200 mg via ORAL
  Filled 2023-10-15 (×7): qty 2

## 2023-10-15 MED ORDER — METHYLPREDNISOLONE SODIUM SUCC 125 MG IJ SOLR
125.0000 mg | Freq: Once | INTRAMUSCULAR | Status: AC
  Administered 2023-10-15: 125 mg via INTRAVENOUS
  Filled 2023-10-15: qty 2

## 2023-10-15 MED ORDER — UMECLIDINIUM BROMIDE 62.5 MCG/ACT IN AEPB
1.0000 | INHALATION_SPRAY | Freq: Every day | RESPIRATORY_TRACT | Status: DC
  Administered 2023-10-16 – 2023-10-24 (×9): 1 via RESPIRATORY_TRACT
  Filled 2023-10-15 (×3): qty 7

## 2023-10-15 MED ORDER — OSELTAMIVIR PHOSPHATE 75 MG PO CAPS
75.0000 mg | ORAL_CAPSULE | Freq: Two times a day (BID) | ORAL | Status: AC
  Administered 2023-10-15 – 2023-10-20 (×10): 75 mg via ORAL
  Filled 2023-10-15 (×11): qty 1

## 2023-10-15 MED ORDER — MONTELUKAST SODIUM 10 MG PO TABS
10.0000 mg | ORAL_TABLET | Freq: Every day | ORAL | Status: DC
  Administered 2023-10-15 – 2023-10-23 (×9): 10 mg via ORAL
  Filled 2023-10-15 (×9): qty 1

## 2023-10-15 MED ORDER — PREDNISONE 20 MG PO TABS
40.0000 mg | ORAL_TABLET | Freq: Every day | ORAL | Status: AC
  Administered 2023-10-16 – 2023-10-19 (×4): 40 mg via ORAL
  Filled 2023-10-15 (×4): qty 2

## 2023-10-15 MED ORDER — ALBUTEROL SULFATE (2.5 MG/3ML) 0.083% IN NEBU
2.5000 mg | INHALATION_SOLUTION | RESPIRATORY_TRACT | Status: DC | PRN
  Administered 2023-10-18: 2.5 mg via RESPIRATORY_TRACT
  Filled 2023-10-15: qty 3

## 2023-10-15 MED ORDER — OSELTAMIVIR PHOSPHATE 75 MG PO CAPS
75.0000 mg | ORAL_CAPSULE | Freq: Once | ORAL | Status: DC
  Filled 2023-10-15: qty 1

## 2023-10-15 MED ORDER — SENNOSIDES-DOCUSATE SODIUM 8.6-50 MG PO TABS
1.0000 | ORAL_TABLET | Freq: Every day | ORAL | Status: DC
  Administered 2023-10-16 – 2023-10-24 (×9): 1 via ORAL
  Filled 2023-10-15 (×9): qty 1

## 2023-10-15 MED ORDER — ONDANSETRON HCL 4 MG PO TABS: 4.0000 mg | ORAL_TABLET | Freq: Four times a day (QID) | ORAL | Status: DC | PRN

## 2023-10-15 MED ORDER — QUETIAPINE FUMARATE 300 MG PO TABS
600.0000 mg | ORAL_TABLET | Freq: Every day | ORAL | Status: DC
  Administered 2023-10-15 – 2023-10-23 (×9): 600 mg via ORAL
  Filled 2023-10-15 (×10): qty 2

## 2023-10-15 MED ORDER — MIRTAZAPINE 15 MG PO TABS
7.5000 mg | ORAL_TABLET | Freq: Every day | ORAL | Status: DC
  Administered 2023-10-15 – 2023-10-23 (×9): 7.5 mg via ORAL
  Filled 2023-10-15 (×10): qty 1

## 2023-10-15 MED ORDER — LACTATED RINGERS IV SOLN: INTRAVENOUS | Status: AC

## 2023-10-15 MED ORDER — POLYETHYLENE GLYCOL 3350 17 G PO PACK: 17.0000 g | PACK | Freq: Every day | ORAL | Status: DC | PRN

## 2023-10-15 NOTE — Assessment & Plan Note (Signed)
 RVP with influenza A H3 -Started on Tamiflu

## 2023-10-15 NOTE — ED Notes (Signed)
 RT at bedside to place bipap.

## 2023-10-15 NOTE — ED Notes (Signed)
 Pt was placed on 6 liter o2 via [place] to take po meds and make a phone call. Pt is in no distress.

## 2023-10-15 NOTE — Assessment & Plan Note (Signed)
-  Continue home meds-pending med record

## 2023-10-15 NOTE — Assessment & Plan Note (Signed)
-  Continue home trazodone as needed

## 2023-10-15 NOTE — H&P (Signed)
 History and Physical    Patient: [name] ZOX:[id number] DOB: 19-Jun-1961 DOA: 10/15/2023 DOS: the patient was seen and examined on 10/15/2023 PCP: Emogene Morgan, MD  Patient coming from: Home  Chief Complaint:  Chief Complaint  Patient presents with   Shortness of Breath   HPI: [name] is a 63 y.o. male with medical history significant of COPD, severe emphysema, adenocarcinoma of left lung, pulmonary embolism and depression came to ED with complaint of shortness of breath for 4 days.  Over the past 4 days he was having progressively worsening shortness of breath, increased cough with greenish sputum production, becoming intermittently hypoxic so EMS was called.  EMS found him on 83% with 4 L, oxygen was increased to 6 L.  Patient denies any fever or chills.  No nausea, vomiting or diarrhea.  No chest pain.  No urinary symptoms.  Appetite okay. He had his last chemo on Wednesday.  Patient does not smoke or drink at this time.  ED course and data reviewed.  On arrival patient was tachycardic and significantly tachypneic with increased work of breathing.  Labs seems stable.  Respiratory panel positive for influenza A.  Blood cultures were drawn. Chest x-ray with severe emphysema.  No active disease EKG, personally reviewed, sinus tachycardia, QTc 477  Patient received Solu-Medrol and breathing treatments in ED and started on Tamiflu.  Review of Systems: As mentioned in the history of present illness. All other systems reviewed and are negative. Past Medical History:  Diagnosis Date   Acute on chronic respiratory failure with hypoxia (HCC)    Asthma    Coagulation disorder (HCC)    COPD (chronic obstructive pulmonary disease) (HCC)    Depression    History of hiatal hernia    Hypertension    Hypokalemia    Leukocytosis    Lung mass    Normocytic anemia 07/13/2023   Pneumonia    Schizophrenia (HCC)    Shortness of breath dyspnea    Stroke [facility])    Umbilical hernia     Ventral hernia    Past Surgical History:  Procedure Laterality Date   COLONOSCOPY WITH PROPOFOL N/A 09/21/2021   Procedure: COLONOSCOPY WITH PROPOFOL;  Surgeon: Regis Bill, MD;  Location: [facility] END[facility]COPY;  Service: Endoscopy;  Laterality: N/A;   HERNIA REPAIR     INSERTION OF MESH N/A 12/18/2014   Procedure: INSERTION OF MESH;  Surgeon: Lattie Haw, MD;  Location: [facility] ORS;  Service: General;  Laterality: N/A;   SUPRA-UMBILICAL HERNIA  12/18/2014   Procedure: SUPRA-UMBILICAL HERNIA;  Surgeon: Lattie Haw, MD;  Location: [facility] ORS;  Service: General;;   UMBILICAL HERNIA REPAIR N/A 12/18/2014   Procedure: HERNIA REPAIR UMBILICAL ADULT;  Surgeon: Lattie Haw, MD;  Location: [facility] ORS;  Service: General;  Laterality: N/A;   VIDEO BRONCHOSCOPY WITH ENDOBRONCHIAL ULTRASOUND N/A 03/15/2023   Procedure: VIDEO BRONCHOSCOPY WITH ENDOBRONCHIAL ULTRASOUND;  Surgeon: Vida Rigger, MD;  Location: [facility] ORS;  Service: Thoracic;  Laterality: N/A;   Social History:  reports that he has quit smoking. His smoking use included cigarettes. He has a 6.8 pack-year smoking history. He has never used smokeless tobacco. He reports that he does not currently use alcohol after a past usage of about 75.0 standard drinks of alcohol per week. He reports that he does not currently use drugs after having used the following drugs: Marijuana and "Crack" cocaine.  No Known Allergies  Family History  Problem Relation Age of Onset  Asthma Mother    Hypertension Father     Prior to Admission medications   Medication Sig Start Date End Date Taking? Authorizing Provider  albuterol (VENTOLIN HFA) 108 (90 Base) MCG/ACT inhaler Inhale 1-2 puffs into the lungs every 6 (six) hours as needed for wheezing or shortness of breath. 07/04/23   Lewanda Rife, MD  apixaban (ELIQUIS) 5 MG TABS tablet Take 1 tablet (5 mg total) by mouth 2 (two) times daily. 08/14/23   [name], MD  APIXABAN Everlene Balls) VTE STARTER PACK  (10MG  AND 5MG ) Take as directed on package: start with two-5mg  tablets twice daily for 7 days. On day 8, switch to one-5mg  tablet twice daily. 07/12/23   [name], MD  clopidogrel (PLAVIX) 75 MG tablet Take 1 tablet (75 mg total) by mouth daily. 07/05/23   Lewanda Rife, MD  diltiazem (CARDIZEM CD) 240 MG 24 hr capsule Take 1 capsule (240 mg total) by mouth daily. 07/04/23   Lewanda Rife, MD  famotidine (PEPCID) 20 MG tablet Take 1 tablet (20 mg total) by mouth daily. 07/04/23   Lewanda Rife, MD  FLUoxetine (PROZAC) 20 MG capsule Take 1 capsule (20 mg total) by mouth daily. 07/05/23   Lewanda Rife, MD  Fluticasone-Umeclidin-Vilant (TRELEGY ELLIPTA) 100-62.5-25 MCG/ACT AEPB Inhale 1 puff into the lungs daily. 12/14/21   Pennie Banter, DO  guaiFENesin (MUCINEX) 600 MG 12 hr tablet Take 1 tablet (600 mg total) by mouth 2 (two) times daily. 09/01/23   [name], MD  ipratropium-albuterol (DUONEB) 0.5-2.5 (3) MG/3ML SOLN Take 3 mLs by nebulization 3 (three) times daily. 07/04/23   Lewanda Rife, MD  mirtazapine (REMERON) 7.5 MG tablet Take 1 tablet (7.5 mg total) by mouth at bedtime. 09/01/23   [name], MD  montelukast (SINGULAIR) 10 MG tablet Take 1 tablet (10 mg total) by mouth at bedtime. 09/01/23   [name], MD  potassium chloride (KLOR-CON M) 10 MEQ tablet Take 1 tablet (10 mEq total) by mouth daily. 08/25/23   [name], MD  pravastatin (PRAVACHOL) 20 MG tablet Take 1 tablet (20 mg total) by mouth daily at 6 PM. 07/04/23   Lewanda Rife, MD  QUEtiapine (SEROQUEL) 300 MG tablet Take 2 tablets (600 mg total) by mouth at bedtime. 07/04/23   Lewanda Rife, MD  senna-docusate (SENOKOT-S) 8.6-50 MG tablet Take 1 tablet by mouth daily after breakfast. 07/05/23   Lewanda Rife, MD  traZODone (DESYREL) 100 MG tablet Take 2 tablets (200 mg total) by mouth at bedtime as needed for sleep. 07/04/23   Lewanda Rife, MD  umeclidinium bromide (INCRUSE ELLIPTA) 62.5 MCG/ACT AEPB  Inhale 1 puff into the lungs daily. 07/05/23   Lewanda Rife, MD    Physical Exam: Vitals:   10/15/23 1512 10/15/23 1513 10/15/23 1530 10/15/23 1600  BP:  130/88 111/82 127/78  Pulse:  (!) 108 (!) 103 100  Resp:  (!) 34 (!) 29 (!) 27  Temp:  98.8 F (37.1 C)    TempSrc:  Oral    SpO2: 95% 95% 94% 98%  Weight:  105 kg    Height:  6' (1.829 m)      General: Vital signs reviewed.  Patient is well-developed and well-nourished, in no acute distress while on BiPAP Head: Normocephalic and atraumatic. Eyes: EOMI, conjunctivae normal, no scleral icterus.  Neck: Supple, trachea midline, normal ROM,  Cardiovascular: RRR, S1 normal, S2 normal, no murmurs, gallops, or rubs. Pulmonary/Chest: Wheeze bilaterally, normal work of breathing Abdominal: Soft, non-tender, non-distended, BS +,  Extremities:  No lower extremity edema bilaterally,  pulses symmetric and intact bilaterally. No cyanosis or clubbing. Neurological: A&O x3, Strength is normal and symmetric bilaterally, cranial nerve II-XII are grossly intact, no focal motor deficit, sensory intact to light touch bilaterally.  Psychiatric: Normal mood and affect.   Data Reviewed: Prior data reviewed as mentioned above  Assessment and Plan: * Acute on chronic hypoxic respiratory failure (HCC) EMS found him hypoxic at 83% and increased oxygen to 6 L, on arriving to ED he was placed on BiPAP due to significantly increased work of breathing.  Uses 2 L at baseline. -Continue with BiPAP-wean as tolerated -Continue with supplemental oxygen  COPD with acute exacerbation (HCC) Increased work of breathing and wheezing on presentation, likely secondary to influenza A infection.  Chest x-ray with no acute abnormalities.  Patient had underlying severe asthma and adenocarcinoma of left lung. -Bronchodilators -Mucolytic therapy -Zithromax -Steroid -Supportive care  Influenza A Checking RVP to see the type of influenza A. -Started on  Tamiflu  Pulmonary embolism (HCC) -Continue home Eliquis  Adenocarcinoma of lung (HCC) Currently on chemotherapy. -Continue outpatient follow-up  Depression -Continue home meds-pending med record  Insomnia -Continue home trazodone as needed   Advance Care Planning:   Code Status: Full Code discussed with patient  Consults: None  Family Communication: No one present in the room  Severity of Illness: The appropriate patient status for this patient is OBSERVATION. Observation status is judged to be reasonable and necessary in order to provide the required intensity of service to ensure the patient's safety. The patient's presenting symptoms, physical exam findings, and initial radiographic and laboratory data in the context of their medical condition is felt to place them at decreased risk for further clinical deterioration. Furthermore, it is anticipated that the patient will be medically stable for discharge from the hospital within 2 midnights of admission.   This record has been created using Conservation officer, historic buildings. Errors have been sought and corrected,but may not always be located. Such creation errors do not reflect on the standard of care.   Author: Arnetha Courser, MD 10/15/2023 5:46 PM  For on call review [email or web address].

## 2023-10-15 NOTE — ED Provider Notes (Signed)
 [facility] Provider Note    Event Date/Time   First MD Initiated Contact with Patient 10/15/23 1513     (approximate)   History   Shortness of Breath   HPI  [name] is a 63 y.o. male who presents to the emergency department today because of concerns for shortness of breath.  Patient states that the symptoms started a couple of days ago.  They are gradually been getting worse.  He does have a history of COPD and wears 2 L at baseline.  He has soreness throughout his chest but no focal pain.  He denies any fevers or chills.  Denies any known sick contacts.     Physical Exam   Triage Vital Signs: ED Triage Vitals  Encounter Vitals Group     BP 10/15/23 1513 130/88     Systolic BP Percentile --      Diastolic BP Percentile --      Pulse Rate 10/15/23 1513 (!) 108     Resp 10/15/23 1513 (!) 34     Temp 10/15/23 1513 98.8 F (37.1 C)     Temp Source 10/15/23 1513 Oral     SpO2 10/15/23 1512 95 %     Weight 10/15/23 1513 231 lb 7.7 oz (105 kg)     Height 10/15/23 1513 6' (1.829 m)     Head Circumference --      Peak Flow --      Pain Score 10/15/23 1513 8     Pain Loc --      Pain Education --      Exclude from Growth Chart --     Most recent vital signs: Vitals:   10/15/23 1512 10/15/23 1513  BP:  130/88  Pulse:  (!) 108  Resp:  (!) 34  Temp:  98.8 F (37.1 C)  SpO2: 95% 95%   General: Awake, alert, oriented. CV:  Good peripheral perfusion. Tachycardia. Resp:  Increased work of breathing. Diffuse rhonchi appreciated. Abd:  No distention.    ED Results / Procedures / Treatments   Labs (all labs ordered are listed, but only abnormal results are displayed) Labs Reviewed  RESP PANEL BY RT-PCR (RSV, FLU A&B, COVID)  RVPGX2 - Abnormal; Notable for the following components:      Result Value   Influenza A by PCR POSITIVE (*)    All other components within normal limits  CBC WITH DIFFERENTIAL/PLATELET - Abnormal; Notable for the  following components:   RBC 4.05 (*)    Hemoglobin 11.4 (*)    HCT 35.4 (*)    RDW 15.9 (*)    All other components within normal limits  BLOOD GAS, VENOUS - Abnormal; Notable for the following components:   pO2, Ven 69 (*)    All other components within normal limits  PROTIME-INR - Abnormal; Notable for the following components:   Prothrombin Time 21.2 (*)    INR 1.8 (*)    All other components within normal limits  COMPREHENSIVE METABOLIC PANEL - Abnormal; Notable for the following components:   Glucose, Bld 141 (*)    AST 42 (*)    All other components within normal limits  CULTURE, BLOOD (ROUTINE X 2)  CULTURE, BLOOD (ROUTINE X 2)  LACTIC ACID, PLASMA  LACTIC ACID, PLASMA  URINALYSIS, W/ REFLEX TO CULTURE (INFECTION SUSPECTED)     EKG  I, Phineas Semen, attending physician, personally viewed and interpreted this EKG  EKG Time: 1514 Rate: 107 Rhythm: sinus  tachycardia Axis: normal Intervals: qtc 477 QRS: narrow, q waves v1 ST changes: no st elevation Impression: abnormal ekg   RADIOLOGY I independently interpreted and visualized the CXR. My interpretation: No pneumonia Radiology interpretation:  IMPRESSION:  Severe emphysema. No active disease.     PROCEDURES:  Critical Care performed: Yes  CRITICAL CARE Performed by: Phineas Semen   Total critical care time: 30 minutes  Critical care time was exclusive of separately billable procedures and treating other patients.  Critical care was necessary to treat or prevent imminent or life-threatening deterioration.  Critical care was time spent personally by me on the following activities: development of treatment plan with patient and/or surrogate as well as nursing, discussions with consultants, evaluation of patient's response to treatment, examination of patient, obtaining history from patient or surrogate, ordering and performing treatments and interventions, ordering and review of laboratory studies,  ordering and review of radiographic studies, pulse oximetry and re-evaluation of patient's condition.   Procedures    MEDICATIONS ORDERED IN ED: Medications - No data to display   IMPRESSION / MDM / ASSESSMENT AND PLAN / ED COURSE  I reviewed the triage vital signs and the nursing notes.                              Differential diagnosis includes, but is not limited to, pneumonia, viral illness, COPD, CHF, PE  Patient's presentation is most consistent with acute presentation with potential threat to life or bodily function.   The patient is on the cardiac monitor to evaluate for evidence of arrhythmia and/or significant heart rate changes.  Patient presents to the emergency department today because of concerns for shortness of breath.  On exam patient does have increased work of breathing.  Patient is tachycardic.  Diffuse rhonchi on exam.  Will check chest x-ray and blood work.  Concern for pneumonia, COPD exacerbation, PE. Given increased work of breathing the patient was placed on bipap.  CXR without pneumonia. Flu was positive. Blood work without significant anemia or electrolyte abnormality. Patient's breathing did improve on bipap. Discussed with Dr. Nelson Chimes with the hospitalist service who will evaluate for admission.    FINAL CLINICAL IMPRESSION(S) / ED DIAGNOSES   Final diagnoses:  COPD exacerbation (HCC)       Note:  This document was prepared using Dragon voice recognition software and may include unintentional dictation errors.    Phineas Semen, MD 10/15/23 [phone]

## 2023-10-15 NOTE — Assessment & Plan Note (Addendum)
 EMS found him hypoxic at 83% and increased oxygen to 6 L, on arriving to ED he was placed on BiPAP due to significantly increased work of breathing.  Uses 2 L at baseline. Still requiring BiPAP as his work of breathing increases significantly whenever tried to wean him off. -Continue with BiPAP-wean as tolerated -Continue with supplemental oxygen

## 2023-10-15 NOTE — Assessment & Plan Note (Signed)
 Currently on chemotherapy. -Continue outpatient follow-up

## 2023-10-15 NOTE — ED Notes (Signed)
Pt is tolerating bipap well.  

## 2023-10-15 NOTE — ED Notes (Signed)
 Dr Nelson Chimes was at bedside.

## 2023-10-15 NOTE — Assessment & Plan Note (Signed)
 Continue home Eliquis

## 2023-10-15 NOTE — ED Triage Notes (Signed)
 To ED from [facility] group [facility] in [place]  Very labored, purse lip breathing. On 6L currently. EDP called to inform  Pt called out for SOB X 2 days, congested. Used inhaler at 10am 2 duonebs given en route  Hx COPD, lung cancer  EMS VS 131/94, HR 102, 100% on 6L (83% on RA per fire dept), 28 ETCO2, RR 26

## 2023-10-15 NOTE — ED Notes (Signed)
 EDP at bedside

## 2023-10-15 NOTE — ED Notes (Signed)
 Wiped face off, gave sip of water. Bipap back on.

## 2023-10-15 NOTE — Assessment & Plan Note (Signed)
 Increased work of breathing and wheezing on presentation, likely secondary to influenza A infection.  Chest x-ray with no acute abnormalities.  Patient had underlying severe asthma and adenocarcinoma of left lung. -Bronchodilators -Mucolytic therapy -Zithromax -Steroid -Supportive care

## 2023-10-15 NOTE — ED Notes (Signed)
 Labs redrawn and sent to lab. New blue top, lactic and lt green per lab request.

## 2023-10-16 DIAGNOSIS — Z8249 Family history of ischemic heart disease and other diseases of the circulatory system: Secondary | ICD-10-CM | POA: Diagnosis not present

## 2023-10-16 DIAGNOSIS — Z825 Family history of asthma and other chronic lower respiratory diseases: Secondary | ICD-10-CM | POA: Diagnosis not present

## 2023-10-16 DIAGNOSIS — J9621 Acute and chronic respiratory failure with hypoxia: Secondary | ICD-10-CM | POA: Diagnosis not present

## 2023-10-16 DIAGNOSIS — R000 Tachycardia, unspecified: Secondary | ICD-10-CM | POA: Diagnosis present

## 2023-10-16 DIAGNOSIS — F209 Schizophrenia, unspecified: Secondary | ICD-10-CM | POA: Diagnosis present

## 2023-10-16 DIAGNOSIS — J101 Influenza due to other identified influenza virus with other respiratory manifestations: Secondary | ICD-10-CM | POA: Diagnosis present

## 2023-10-16 DIAGNOSIS — I2699 Other pulmonary embolism without acute cor pulmonale: Secondary | ICD-10-CM | POA: Diagnosis not present

## 2023-10-16 DIAGNOSIS — Z86711 Personal history of pulmonary embolism: Secondary | ICD-10-CM | POA: Diagnosis not present

## 2023-10-16 DIAGNOSIS — F32A Depression, unspecified: Secondary | ICD-10-CM | POA: Diagnosis present

## 2023-10-16 DIAGNOSIS — I10 Essential (primary) hypertension: Secondary | ICD-10-CM | POA: Diagnosis present

## 2023-10-16 DIAGNOSIS — Z7902 Long term (current) use of antithrombotics/antiplatelets: Secondary | ICD-10-CM | POA: Diagnosis not present

## 2023-10-16 DIAGNOSIS — Z1152 Encounter for screening for COVID-19: Secondary | ICD-10-CM | POA: Diagnosis not present

## 2023-10-16 DIAGNOSIS — G4700 Insomnia, unspecified: Secondary | ICD-10-CM | POA: Diagnosis present

## 2023-10-16 DIAGNOSIS — J439 Emphysema, unspecified: Secondary | ICD-10-CM | POA: Diagnosis present

## 2023-10-16 DIAGNOSIS — T380X5A Adverse effect of glucocorticoids and synthetic analogues, initial encounter: Secondary | ICD-10-CM | POA: Diagnosis present

## 2023-10-16 DIAGNOSIS — Z79899 Other long term (current) drug therapy: Secondary | ICD-10-CM | POA: Diagnosis not present

## 2023-10-16 DIAGNOSIS — Z923 Personal history of irradiation: Secondary | ICD-10-CM | POA: Diagnosis not present

## 2023-10-16 DIAGNOSIS — Z8673 Personal history of transient ischemic attack (TIA), and cerebral infarction without residual deficits: Secondary | ICD-10-CM | POA: Diagnosis not present

## 2023-10-16 DIAGNOSIS — Z9221 Personal history of antineoplastic chemotherapy: Secondary | ICD-10-CM | POA: Diagnosis not present

## 2023-10-16 DIAGNOSIS — J9601 Acute respiratory failure with hypoxia: Secondary | ICD-10-CM | POA: Diagnosis present

## 2023-10-16 DIAGNOSIS — Z85118 Personal history of other malignant neoplasm of bronchus and lung: Secondary | ICD-10-CM | POA: Diagnosis not present

## 2023-10-16 DIAGNOSIS — R739 Hyperglycemia, unspecified: Secondary | ICD-10-CM | POA: Diagnosis not present

## 2023-10-16 DIAGNOSIS — Z87891 Personal history of nicotine dependence: Secondary | ICD-10-CM | POA: Diagnosis not present

## 2023-10-16 DIAGNOSIS — Z7901 Long term (current) use of anticoagulants: Secondary | ICD-10-CM | POA: Diagnosis not present

## 2023-10-16 DIAGNOSIS — J441 Chronic obstructive pulmonary disease with (acute) exacerbation: Secondary | ICD-10-CM | POA: Diagnosis present

## 2023-10-16 LAB — RESPIRATORY PANEL BY PCR

## 2023-10-16 LAB — CBC
HCT: 31.7 % — ABNORMAL LOW (ref 39.0–52.0)
Hemoglobin: 10.2 g/dL — ABNORMAL LOW (ref 13.0–17.0)
MCH: 27.7 pg (ref 26.0–34.0)
MCHC: 32.2 g/dL (ref 30.0–36.0)
MCV: 86.1 fL (ref 80.0–100.0)
Platelets: 147 10*3/uL — ABNORMAL LOW (ref 150–400)
RBC: 3.68 MIL/uL — ABNORMAL LOW (ref 4.22–5.81)
RDW: 16 % — ABNORMAL HIGH (ref 11.5–15.5)
WBC: 5.4 10*3/uL (ref 4.0–10.5)
nRBC: 0 % (ref 0.0–0.2)

## 2023-10-16 LAB — URINALYSIS, W/ REFLEX TO CULTURE (INFECTION SUSPECTED)
Bacteria, UA: NONE SEEN
Bilirubin Urine: NEGATIVE
Glucose, UA: NEGATIVE mg/dL
Hgb urine dipstick: NEGATIVE
Ketones, ur: NEGATIVE mg/dL
Leukocytes,Ua: NEGATIVE
Nitrite: NEGATIVE
Protein, ur: 30 mg/dL — AB
Specific Gravity, Urine: 1.023 (ref 1.005–1.030)
pH: 5 (ref 5.0–8.0)

## 2023-10-16 LAB — BASIC METABOLIC PANEL
Anion gap: 12 (ref 5–15)
BUN: 15 mg/dL (ref 8–23)
CO2: 25 mmol/L (ref 22–32)
Calcium: 9.3 mg/dL (ref 8.9–10.3)
Chloride: 102 mmol/L (ref 98–111)
Creatinine, Ser: 0.8 mg/dL (ref 0.61–1.24)
GFR, Estimated: >60 mL/min (ref >60)
Glucose, Bld: 170 mg/dL — ABNORMAL HIGH (ref 70–99)
Potassium: 4.1 mmol/L (ref 3.5–5.1)
Sodium: 139 mmol/L (ref 135–145)

## 2023-10-16 MED ORDER — ALUM & MAG HYDROXIDE-SIMETH 200-200-20 MG/5ML PO SUSP
30.0000 mL | ORAL | Status: DC | PRN
  Administered 2023-10-16: 30 mL via ORAL
  Filled 2023-10-16: qty 30

## 2023-10-16 NOTE — Hospital Course (Addendum)
 [name] is a 63 y.o. male with medical history significant of COPD, severe emphysema, adenocarcinoma of left lung, pulmonary embolism and depression came to ED with complaint of shortness of breath for 4 days.  EMS found him on 83% with 4 L, oxygen was increased to 6 L.   On arrival patient was tachycardic and significantly tachypneic with increased work of breathing.  Labs seems stable.  Respiratory panel positive for influenza A.  Blood cultures were drawn. Chest x-ray with severe emphysema.  No active disease EKG, personally reviewed, sinus tachycardia, QTc 477  Patient received Solu-Medrol and breathing treatments in ED and started on Tamiflu.   3/10: Mildly elevated blood pressure at 152/110, RVP with influenza A H3, MRSA PCR negative, UA with mild protein urea, labs seems stable, mildly elevated blood glucose but A1c was 5.6 likely secondary to steroid. Patient with worsening work of breathing again when tried to wean off from BiPAP so he was placed back on BiPAP.  3/11: Still mild tachycardia and tachypnea, able to wean off from BiPAP on 5 L of oxygen.  1/4 blood cultures with staph species-no further identification.  Likely a contaminant but based on his significant underlying comorbidities he was given a dose of vancomycin and holding more antibiotic.  Blood cultures were repeated.

## 2023-10-16 NOTE — TOC Initial Note (Addendum)
 Transition of Care [facility]) - Initial/Assessment Note    Patient Details  Name: [name] MRN: [id number] Date of Birth: 12/15/1960  [facility] CM/SW Contact:    Jacob Liner, LCSW Phone Number: 10/16/2023, 1:29 PM  Clinical Narrative:   Per chart review, patient is from [facility]. CSW called owner, [name] [phone]) and confirmed. Patient was getting home health prior to admission but [name] stated he would refuse them. Per Patient [name], he is active with [facility]. Liaison confirmed he is getting nursing and PT. No DME use prior to admission other than oxygen. Per chart review, this was ordered through [employer] in January. CSW sent secure email to liaison to see how many liters his orders are for. No further concerns. Will complete FL2 at discharge and send documentation with him when he leaves. Facility will transport if discharge is on a weekday. Medications will need to be sent to [facility].  4:37 pm: [employer] confirmed patient has 2 L oxygen through them.  Expected Discharge Plan: Group Home (with home health) Barriers to Discharge: Continued Medical Work up   Patient Goals and CMS Choice            Expected Discharge Plan and Services     Post Acute Care Choice: Resumption of Svcs/PTA Provider Living arrangements for the past 2 months: Group Home                           HH Arranged: RN, PT HH Agency: [facility] Date [facility] Agency Contacted: 10/16/23   Representative spoke with at [facility] Agency: [name]  Prior Living Arrangements/Services Living arrangements for the past 2 months: Group Home Lives with:: Facility Resident Patient language and need for interpreter reviewed:: Yes        Need for Family Participation in Patient Care: Yes (Comment) Care giver support system in place?: Yes (comment) Current home services: DME Criminal Activity/Legal Involvement Pertinent to Current Situation/Hospitalization: No -  Comment as needed  Activities of Daily Living      Permission Sought/Granted                  Emotional Assessment         Alcohol / Substance Use: Not Applicable Psych Involvement: No (comment)  Admission diagnosis:  Acute hypoxic respiratory failure (HCC) [J96.01] Acute on chronic hypoxic respiratory failure (HCC) [J96.21] Patient Active Problem List   Diagnosis Date Noted   Acute on chronic hypoxic respiratory failure (HCC) 10/15/2023   Influenza A 10/15/2023   Encounter for antineoplastic immunotherapy 09/01/2023   Lightheaded 08/25/2023   Pulmonary embolism (HCC) 07/13/2023   Normocytic anemia 07/13/2023   Dyslipidemia 06/20/2023   Depression 06/20/2023   COPD (chronic obstructive pulmonary disease) (HCC) 06/13/2023   Insomnia 05/25/2023   Adenocarcinoma of lung (HCC) 12/12/2021   Undifferentiated schizophrenia (HCC)    Hypokalemia 04/13/2020   Essential hypertension 04/13/2020   Coagulation disorder (HCC) 12/16/2019   COPD exacerbation (HCC) 01/16/2017   Ventral hernia 12/18/2014   PCP:  Emogene Morgan, MD Pharmacy:   [facility] [place] [place] - [address] [place] [address] Phone: [phone] Fax: [phone]     Social Drivers of Health (SDOH) Social History: SDOH Screenings   Food Insecurity: Patient Unable To Answer (08/18/2023)   Received from [facility]  Housing: Patient Unable To Answer (08/18/2023)   Received from  [facility]  Transportation Needs: Patient Unable To Answer (08/18/2023)   Received from [facility]  Utilities: Patient Unable To Answer (08/18/2023)   Received from [facility]  Alcohol Screen: Low Risk  (06/16/2023)  Depression (PHQ2-9): Low Risk  (02/03/2023)  Financial Resource Strain: Medium Risk (08/18/2023)   Received from [facility]  Tobacco Use: Medium Risk (10/15/2023)    SDOH Interventions:     Readmission Risk Interventions    05/26/2023    2:38 PM  Readmission Risk Prevention Plan  Transportation Screening Complete  PCP or Specialist Appt within 3-5 Days Complete  HRI or Home Care Consult Complete  Social Work Consult for Recovery Care Planning/Counseling Complete  Palliative Care Screening Not Applicable  Medication Review Oceanographer) Complete

## 2023-10-16 NOTE — ED Notes (Signed)
 Pt taken of Bipap and placed on 6l/min via .

## 2023-10-16 NOTE — Progress Notes (Signed)
  Progress Note   Patient: [name] DOB: July 06, 1961 DOA: 10/15/2023     0 DOS: the patient was seen and examined on 10/16/2023   Brief hospital course:  [name] is a 63 y.o. male with medical history significant of COPD, severe emphysema, adenocarcinoma of left lung, pulmonary embolism and depression came to ED with complaint of shortness of breath for 4 days.  EMS found him on 83% with 4 L, oxygen was increased to 6 L.   On arrival patient was tachycardic and significantly tachypneic with increased work of breathing.  Labs seems stable.  Respiratory panel positive for influenza A.  Blood cultures were drawn. Chest x-ray with severe emphysema.  No active disease EKG, personally reviewed, sinus tachycardia, QTc 477  Patient received Solu-Medrol and breathing treatments in ED and started on Tamiflu.   3/10: Mildly elevated blood pressure at 152/110, RVP with influenza A H3, MRSA PCR negative, UA with mild protein urea, labs seems stable, mildly elevated blood glucose but A1c was 5.6 likely secondary to steroid. Patient with worsening work of breathing again when tried to wean off from BiPAP so he was placed back on BiPAP.  Assessment and Plan: * Acute on chronic hypoxic respiratory failure (HCC) EMS found him hypoxic at 83% and increased oxygen to 6 L, on arriving to ED he was placed on BiPAP due to significantly increased work of breathing.  Uses 2 L at baseline. Still requiring BiPAP as his work of breathing increases significantly whenever tried to wean him off. -Continue with BiPAP-wean as tolerated -Continue with supplemental oxygen  COPD exacerbation (HCC) Increased work of breathing and wheezing on presentation, likely secondary to influenza A infection.  Chest x-ray with no acute abnormalities.  Patient had underlying severe asthma and adenocarcinoma of left lung. -Bronchodilators -Mucolytic therapy -Zithromax -Steroid -Supportive care  Influenza A RVP with  influenza A H3 -Started on Tamiflu  Pulmonary embolism (HCC) -Continue home Eliquis  Adenocarcinoma of lung (HCC) Currently on chemotherapy. -Continue outpatient follow-up  Depression -Continue home meds-pending med record  Insomnia -Continue home trazodone as needed   Subjective: Patient was still feeling quite short of breath.  Getting breathing treatment.  Physical Exam: Vitals:   10/16/23 0406 10/16/23 0600 10/16/23 0850 10/16/23 1241  BP:  (!) 152/110    Pulse: 97 97    Resp: 20 (!) 21    Temp:   98.7 F (37.1 C) 98 F (36.7 C)  TempSrc:   Oral Axillary  SpO2: 100% 97%    Weight:      Height:       General.  Ill-appearing gentleman, in no acute distress. Pulmonary.  Scattered wheeze bilaterally, normal respiratory effort. CV.  Regular rate and rhythm, no JVD, rub or murmur. Abdomen.  Soft, nontender, nondistended, BS positive. CNS.  Alert and oriented .  No focal neurologic deficit. Extremities.  No edema, no cyanosis, pulses intact and symmetrical. Psychiatry.  Judgment and insight appears normal.   Data Reviewed: Prior data reviewed  Family Communication: Discussed with patient  Disposition: Status is: Inpatient Remains inpatient appropriate because: Severity of illness  Planned Discharge Destination: Home  VT prophylaxis.  Eliquis Time spent: 50 minutes  This record has been created using Conservation officer, historic buildings. Errors have been sought and corrected,but may not always be located. Such creation errors do not reflect on the standard of care.   Author: Arnetha Courser, MD 10/16/2023 1:48 PM  For on call review [email or web address].

## 2023-10-16 NOTE — ED Notes (Signed)
 Pt placed back on bipap d/t increased resp rate with grunting.

## 2023-10-17 DIAGNOSIS — J101 Influenza due to other identified influenza virus with other respiratory manifestations: Secondary | ICD-10-CM | POA: Diagnosis not present

## 2023-10-17 DIAGNOSIS — I2699 Other pulmonary embolism without acute cor pulmonale: Secondary | ICD-10-CM | POA: Diagnosis not present

## 2023-10-17 DIAGNOSIS — J441 Chronic obstructive pulmonary disease with (acute) exacerbation: Secondary | ICD-10-CM | POA: Diagnosis not present

## 2023-10-17 DIAGNOSIS — J9621 Acute and chronic respiratory failure with hypoxia: Secondary | ICD-10-CM | POA: Diagnosis not present

## 2023-10-17 LAB — BLOOD CULTURE ID PANEL (REFLEXED) - BCID2

## 2023-10-17 MED ORDER — IPRATROPIUM-ALBUTEROL 0.5-2.5 (3) MG/3ML IN SOLN
3.0000 mL | Freq: Four times a day (QID) | RESPIRATORY_TRACT | Status: DC
  Administered 2023-10-18 (×3): 3 mL via RESPIRATORY_TRACT
  Filled 2023-10-17 (×3): qty 3

## 2023-10-17 MED ORDER — VANCOMYCIN HCL 1500 MG/300ML IV SOLN
1500.0000 mg | Freq: Once | INTRAVENOUS | Status: AC
  Administered 2023-10-17: 1500 mg via INTRAVENOUS
  Filled 2023-10-17: qty 300

## 2023-10-17 MED ORDER — VANCOMYCIN HCL IN DEXTROSE 1-5 GM/200ML-% IV SOLN
1000.0000 mg | Freq: Once | INTRAVENOUS | Status: AC
  Administered 2023-10-17: 1000 mg via INTRAVENOUS
  Filled 2023-10-17: qty 200

## 2023-10-17 NOTE — Assessment & Plan Note (Signed)
 EMS found him hypoxic at 83% and increased oxygen to 6 L, on arriving to ED he was placed on BiPAP due to significantly increased work of breathing.  Uses 2 L at baseline. Able to wean from BiPAP but requiring 5 to 6 L of oxygen -Continue with supplemental oxygen-wean to baseline as tolerated

## 2023-10-17 NOTE — ED Notes (Signed)
 Pt removed from bi-pap at this time per request. Pt placed on 5L [place].

## 2023-10-17 NOTE — Progress Notes (Signed)
 Progress Note   Patient: [name] ZOX:[id number] DOB: 10-13-1960 DOA: 10/15/2023     1 DOS: the patient was seen and examined on 10/17/2023   Brief hospital course:  [name] is a 63 y.o. male with medical history significant of COPD, severe emphysema, adenocarcinoma of left lung, pulmonary embolism and depression came to ED with complaint of shortness of breath for 4 days.  EMS found him on 83% with 4 L, oxygen was increased to 6 L.   On arrival patient was tachycardic and significantly tachypneic with increased work of breathing.  Labs seems stable.  Respiratory panel positive for influenza A.  Blood cultures were drawn. Chest x-ray with severe emphysema.  No active disease EKG, personally reviewed, sinus tachycardia, QTc 477  Patient received Solu-Medrol and breathing treatments in ED and started on Tamiflu.   3/10: Mildly elevated blood pressure at 152/110, RVP with influenza A H3, MRSA PCR negative, UA with mild protein urea, labs seems stable, mildly elevated blood glucose but A1c was 5.6 likely secondary to steroid. Patient with worsening work of breathing again when tried to wean off from BiPAP so he was placed back on BiPAP.  3/11: Still mild tachycardia and tachypnea, able to wean off from BiPAP on 5 L of oxygen.  1/4 blood cultures with staph species-no further identification.  Likely a contaminant but based on his significant underlying comorbidities he was given a dose of vancomycin and holding more antibiotic.  Blood cultures were repeated.  Assessment and Plan: * Acute on chronic hypoxic respiratory failure (HCC) EMS found him hypoxic at 83% and increased oxygen to 6 L, on arriving to ED he was placed on BiPAP due to significantly increased work of breathing.  Uses 2 L at baseline. Able to wean from BiPAP but requiring 5 to 6 L of oxygen -Continue with supplemental oxygen-wean to baseline as tolerated  COPD exacerbation (HCC) Increased work of breathing and wheezing on  presentation, likely secondary to influenza A infection.  Chest x-ray with no acute abnormalities.  Patient had underlying severe asthma and adenocarcinoma of left lung. -Bronchodilators -Mucolytic therapy -Zithromax -Steroid -Supportive care  Influenza A RVP with influenza A H3 -Started on Tamiflu  Pulmonary embolism (HCC) -Continue home Eliquis  Adenocarcinoma of lung (HCC) Currently on chemotherapy. -Continue outpatient follow-up  Depression -Continue home meds-pending med record  Insomnia -Continue home trazodone as needed   Subjective: Patient was feeling very weak and lethargic when seen today.  Appetite remained very poor.  Physical Exam: Vitals:   10/17/23 0615 10/17/23 0747 10/17/23 0800 10/17/23 1202  BP: (!) 156/103 (!) 141/88 (!) 146/95 (!) 160/92  Pulse: 99 (!) 103 (!) 101 99  Resp: 19 19 (!) 21 19  Temp:  98 F (36.7 C)    TempSrc:  Oral    SpO2: 96% 94% 95% 98%  Weight:      Height:       General.  Chronically ill-appearing gentleman, in no acute distress. Pulmonary.  Scattered wheeze and few rhonchi bilaterally, normal respiratory effort. CV.  Regular rate and rhythm, no JVD, rub or murmur. Abdomen.  Soft, nontender, nondistended, BS positive. CNS.  Alert and oriented .  No focal neurologic deficit. Extremities.  No edema, no cyanosis, pulses intact and symmetrical. Psychiatry.  Judgment and insight appears normal.    Data Reviewed: Prior data reviewed  Family Communication: Discussed with patient  Disposition: Status is: Inpatient Remains inpatient appropriate because: Severity of illness  Planned Discharge Destination: Home  VT prophylaxis.  Eliquis Time spent: 50 minutes  This record has been created using Conservation officer, historic buildings. Errors have been sought and corrected,but may not always be located. Such creation errors do not reflect on the standard of care.   Author: Arnetha Courser, MD 10/17/2023 1:56 PM  For on call review  [email or web address].

## 2023-10-17 NOTE — Progress Notes (Signed)
 PHARMACY - PHYSICIAN COMMUNICATION CRITICAL VALUE ALERT - BLOOD CULTURE IDENTIFICATION (BCID)  [name] is an 63 y.o. male who presented to [facility] on 10/15/2023 with a chief complaint of  respiratory failure, flu, COPD exacerbation in setting of lung cancer, on chemo   Assessment:  Staph species in 1 of 4 bottes (include suspected source if known)  Name of physician (or Provider) Contacted: Arnetha Courser    Current antibiotics:  Azithromycin, Tamiflu   Changes to prescribed antibiotics recommended:  Recommendations declined by provider due to history of resistant organism - suggested result was most likely contaminant.  MD was concerned due to severity of pt condition covering for possible Coag neg staph would be warranted in this case. - will order single loading dose of Vancomycin 2500 mg IV X 1 and defer to ID team regarding further treatment.   Results for orders placed or performed during the hospital encounter of 10/15/23  Blood Culture ID Panel (Reflexed) (Collected: 10/15/2023  3:22 PM)  Result Value Ref Range   Enterococcus faecalis NOT DETECTED NOT DETECTED   Enterococcus Faecium NOT DETECTED NOT DETECTED   Listeria monocytogenes NOT DETECTED NOT DETECTED   Staphylococcus species DETECTED (A) NOT DETECTED   Staphylococcus aureus (BCID) NOT DETECTED NOT DETECTED   Staphylococcus epidermidis NOT DETECTED NOT DETECTED   Staphylococcus lugdunensis NOT DETECTED NOT DETECTED   Streptococcus species NOT DETECTED NOT DETECTED   Streptococcus agalactiae NOT DETECTED NOT DETECTED   Streptococcus pneumoniae NOT DETECTED NOT DETECTED   Streptococcus pyogenes NOT DETECTED NOT DETECTED   A.calcoaceticus-baumannii NOT DETECTED NOT DETECTED   Bacteroides fragilis NOT DETECTED NOT DETECTED   Enterobacterales NOT DETECTED NOT DETECTED   Enterobacter cloacae complex NOT DETECTED NOT DETECTED   Escherichia coli NOT DETECTED NOT DETECTED   Klebsiella aerogenes NOT DETECTED NOT DETECTED    Klebsiella oxytoca NOT DETECTED NOT DETECTED   Klebsiella pneumoniae NOT DETECTED NOT DETECTED   Proteus species NOT DETECTED NOT DETECTED   Salmonella species NOT DETECTED NOT DETECTED   Serratia marcescens NOT DETECTED NOT DETECTED   Haemophilus influenzae NOT DETECTED NOT DETECTED   Neisseria meningitidis NOT DETECTED NOT DETECTED   Pseudomonas aeruginosa NOT DETECTED NOT DETECTED   Stenotrophomonas maltophilia NOT DETECTED NOT DETECTED   Candida albicans NOT DETECTED NOT DETECTED   Candida auris NOT DETECTED NOT DETECTED   Candida glabrata NOT DETECTED NOT DETECTED   Candida krusei NOT DETECTED NOT DETECTED   Candida parapsilosis NOT DETECTED NOT DETECTED   Candida tropicalis NOT DETECTED NOT DETECTED   Cryptococcus neoformans/gattii NOT DETECTED NOT DETECTED    Leonie Amacher D 10/17/2023  7:18 AM

## 2023-10-18 ENCOUNTER — Inpatient Hospital Stay

## 2023-10-18 DIAGNOSIS — J9621 Acute and chronic respiratory failure with hypoxia: Secondary | ICD-10-CM | POA: Diagnosis not present

## 2023-10-18 LAB — BLOOD GAS, VENOUS
Acid-Base Excess: 5.7 mmol/L — ABNORMAL HIGH (ref 0.0–2.0)
Bicarbonate: 32.1 mmol/L — ABNORMAL HIGH (ref 20.0–28.0)
O2 Saturation: 83 %
Patient temperature: 37
pCO2, Ven: 53 mmHg (ref 44–60)
pH, Ven: 7.39 (ref 7.25–7.43)
pO2, Ven: 53 mmHg — ABNORMAL HIGH (ref 32–45)

## 2023-10-18 LAB — BASIC METABOLIC PANEL
Anion gap: 10 (ref 5–15)
BUN: 16 mg/dL (ref 8–23)
CO2: 27 mmol/L (ref 22–32)
Calcium: 9.1 mg/dL (ref 8.9–10.3)
Chloride: 103 mmol/L (ref 98–111)
Creatinine, Ser: 0.52 mg/dL — ABNORMAL LOW (ref 0.61–1.24)
GFR, Estimated: >60 mL/min (ref >60)
Glucose, Bld: 121 mg/dL — ABNORMAL HIGH (ref 70–99)
Potassium: 4.4 mmol/L (ref 3.5–5.1)
Sodium: 140 mmol/L (ref 135–145)

## 2023-10-18 LAB — CBC
HCT: 31.6 % — ABNORMAL LOW (ref 39.0–52.0)
Hemoglobin: 10.1 g/dL — ABNORMAL LOW (ref 13.0–17.0)
MCH: 28 pg (ref 26.0–34.0)
MCHC: 32 g/dL (ref 30.0–36.0)
MCV: 87.5 fL (ref 80.0–100.0)
Platelets: 140 10*3/uL — ABNORMAL LOW (ref 150–400)
RBC: 3.61 MIL/uL — ABNORMAL LOW (ref 4.22–5.81)
RDW: 15.8 % — ABNORMAL HIGH (ref 11.5–15.5)
WBC: 5.3 10*3/uL (ref 4.0–10.5)
nRBC: 0 % (ref 0.0–0.2)

## 2023-10-18 LAB — D-DIMER, QUANTITATIVE: D-Dimer, Quant: 0.49 ug{FEU}/mL (ref 0.00–0.50)

## 2023-10-18 LAB — BRAIN NATRIURETIC PEPTIDE: B Natriuretic Peptide: 61.1 pg/mL (ref 0.0–100.0)

## 2023-10-18 LAB — MAGNESIUM: Magnesium: 2.2 mg/dL (ref 1.7–2.4)

## 2023-10-18 MED ORDER — SODIUM CHLORIDE 3 % IN NEBU
4.0000 mL | INHALATION_SOLUTION | Freq: Two times a day (BID) | RESPIRATORY_TRACT | Status: AC
  Administered 2023-10-18 – 2023-10-21 (×6): 4 mL via RESPIRATORY_TRACT
  Filled 2023-10-18 (×6): qty 4

## 2023-10-18 MED ORDER — IPRATROPIUM-ALBUTEROL 0.5-2.5 (3) MG/3ML IN SOLN
3.0000 mL | Freq: Four times a day (QID) | RESPIRATORY_TRACT | Status: DC
  Administered 2023-10-18 – 2023-10-20 (×9): 3 mL via RESPIRATORY_TRACT
  Filled 2023-10-18 (×9): qty 3

## 2023-10-18 NOTE — Plan of Care (Signed)
  Problem: Education: Goal: Knowledge of General Education information will improve Description: Including pain rating scale, medication(s)/side effects and non-pharmacologic comfort measures Outcome: Progressing   Problem: Clinical Measurements: Goal: Ability to maintain clinical measurements within normal limits will improve Outcome: Progressing   Problem: Clinical Measurements: Goal: Will remain free from infection Outcome: Progressing   Problem: Clinical Measurements: Goal: Diagnostic test results will improve Outcome: Progressing   Problem: Clinical Measurements: Goal: Respiratory complications will improve Outcome: Progressing   Problem: Clinical Measurements: Goal: Cardiovascular complication will be avoided Outcome: Progressing   Problem: Skin Integrity: Goal: Risk for impaired skin integrity will decrease Outcome: Progressing   Problem: Safety: Goal: Ability to remain free from injury will improve Outcome: Progressing

## 2023-10-18 NOTE — TOC Progression Note (Signed)
 Transition of Care [facility]) - Progression Note    Patient Details  Name: [name] MRN: [id number] Date of Birth: 12/22/1960  Transition of Care [facility]) CM/SW Contact  Chapman Fitch, RN Phone Number: 10/18/2023, 4:04 PM  Clinical Narrative:     Patient currently requiring 4 L O2.  Wears 2 at baseline  Expected Discharge Plan: Group Home (with home health) Barriers to Discharge: Continued Medical Work up  Expected Discharge Plan and Services     Post Acute Care Choice: Resumption of Svcs/PTA Provider Living arrangements for the past 2 months: Group Home                           HH Arranged: RN, PT [facility] Agency: [facility] Date [facility] Agency Contacted: 10/16/23   Representative spoke with at [facility] Agency: Elnita Maxwell   Social Determinants of Health (SDOH) Interventions SDOH Screenings   Food Insecurity: No Food Insecurity (10/17/2023)  Housing: Low Risk  (10/17/2023)  Transportation Needs: No Transportation Needs (10/17/2023)  Utilities: Not At Risk (10/17/2023)  Alcohol Screen: Low Risk  (06/16/2023)  Depression (PHQ2-9): Low Risk  (02/03/2023)  Financial Resource Strain: Medium Risk (08/18/2023)   Received from [facility]  Tobacco Use: Medium Risk (10/15/2023)    Readmission Risk Interventions    10/16/2023    1:34 PM 05/26/2023    2:38 PM  Readmission Risk Prevention Plan  Transportation Screening  Complete  PCP or Specialist Appt within 3-5 Days  Complete  HRI or Home Care Consult  Complete  Social Work Consult for Recovery Care Planning/Counseling  Complete  Palliative Care Screening  Not Applicable  Medication Review [employer]) Complete Complete  PCP or Specialist appointment within 3-5 days of discharge Complete   HRI or Home Care Consult Complete   SW Recovery Care/Counseling Consult Complete   Palliative Care Screening Not Applicable   Skilled Nursing Facility Not Applicable

## 2023-10-18 NOTE — Plan of Care (Signed)
   Problem: Education: Goal: Knowledge of General Education information will improve Description Including pain rating scale, medication(s)/side effects and non-pharmacologic comfort measures Outcome: Progressing

## 2023-10-18 NOTE — Progress Notes (Signed)
  PROGRESS NOTE    [name]  ZOX:[id number] DOB: 06-Apr-1961 DOA: 10/15/2023 PCP: Emogene Morgan, MD  [id number]  LOS: 2 days   Brief hospital course:   Assessment & Plan:  [name] is a 63 y.o. male with medical history significant of COPD, severe emphysema, adenocarcinoma of left lung, pulmonary embolism and depression came to ED with complaint of shortness of breath for 4 days.  EMS found him on 83% with 4 L, oxygen was increased to 6 L.    On arrival patient was tachycardic and significantly tachypneic with increased work of breathing.  Labs seems stable.  Respiratory panel positive for influenza A.  Blood cultures were drawn. Chest x-ray with severe emphysema.  No active disease EKG, personally reviewed, sinus tachycardia, QTc 477   Patient received Solu-Medrol and breathing treatments in ED and started on Tamiflu.   * Acute on chronic hypoxic respiratory failure (HCC) EMS found him hypoxic at 83% and increased oxygen to 6 L, on arriving to ED he was placed on BiPAP due to significantly increased work of breathing.  Uses 2 L at baseline. Able to wean from BiPAP but requiring 5 to 6 L of oxygen --Continue supplemental O2 to keep sats >=90%, wean as tolerated  COPD exacerbation (HCC) Increased work of breathing and wheezing on presentation, likely secondary to influenza A infection.  Chest x-ray with no acute abnormalities.  Patient had underlying severe asthma and adenocarcinoma of left lung. --cont prednisone 40 mg daily --start hypertonic saline neb BID --DuoNeb scheduled -Bronchodilators   Influenza A RVP with influenza A H3 --cont Tamiflu   Pulmonary embolism (HCC) --cont home Eliquis   Adenocarcinoma of lung (HCC) Currently on chemotherapy. -Continue outpatient follow-up   Depression -Continue home meds-pending med record   Insomnia -Continue home trazodone as needed  Hyperglycemia 2/2  Steroid use --BG checks qAM     DVT prophylaxis:  WJ:[id number] Code Status: Full code  Family Communication:  Level of care: Telemetry Medical Dispo:   The patient is from: home Anticipated d/c is to: home Anticipated d/c date is: 2-3 days   Subjective and Interval History:  Pt got more short of breath after exertion.   Objective: Vitals:   10/18/23 0441 10/18/23 0757 10/18/23 0935 10/18/23 1639  BP: 124/85  115/85 133/85  Pulse: 61  94 91  Resp: 18  20 18   Temp: 98.8 F (37.1 C)  (!) 97.4 F (36.3 C) 98.2 F (36.8 C)  TempSrc:   Oral   SpO2: 96% 95% 94% 97%  Weight:      Height:        Intake/Output Summary (Last 24 hours) at 10/18/2023 1831 Last data filed at 10/18/2023 1500 Gross per 24 hour  Intake 480 ml  Output --  Net 480 ml   Filed Weights   10/15/23 1513 10/17/23 2300  Weight: 105 kg 98.7 kg    Examination:   Constitutional: NAD, AAOx3 HEENT: conjunctivae and lids normal, EOMI CV: No cyanosis.   RESP: increased RR, loud rattling breath sounds, on 4L Neuro: II - XII grossly intact.     Data Reviewed: I have personally reviewed labs and imaging studies  Time spent: 50 minutes  Darlin Priestly, MD [facility] If 7PM-7AM, please contact night-coverage 10/18/2023, 6:31 PM

## 2023-10-18 NOTE — Evaluation (Signed)
 Physical Therapy Evaluation Patient Details Name: [name] MRN: [id number] DOB: 1960-12-27 Today's Date: 10/18/2023  History of Present Illness  Pt is a 63 y.o. male came to ED with complaint of shortness of breath for 4 days. Admitted for management of acute on chronic hypoxic respiratory failure, COPD exacerbation and Flu A. PMH of COPD, severe emphysema, adenocarcinoma of left lung, pulmonary embolism and depression.  Clinical Impression  Pt is a pleasant 63 year old male who was admitted for ARF with SOB x 4 days. Chronic O2 at 2L, currently on 4L this date. Pt received in chair and performs transfers and ambulation with cga and RW. Quick desat to 86% on 3L with increased WOB. HR increased to 115bpm with exertion. Pt demonstrates deficits with strength/mobility/endurance. Secure chat sent to RN as pt requesting breathing tx. Would benefit from skilled PT to address above deficits and promote optimal return to PLOF. Pt will continue to receive skilled PT services while admitted and will defer to TOC/care team for updates regarding disposition planning.         If plan is discharge home, recommend the following: A little help with walking and/or transfers;A little help with bathing/dressing/bathroom;Help with stairs or ramp for entrance;Assist for transportation   Can travel by private vehicle        Equipment Recommendations Rolling walker (2 wheels)  Recommendations for Other Services       Functional Status Assessment Patient has had a recent decline in their functional status and demonstrates the ability to make significant improvements in function in a reasonable and predictable amount of time.     Precautions / Restrictions Precautions Precautions: Fall Recall of Precautions/Restrictions: Intact Restrictions Weight Bearing Restrictions Per Provider Order: No      Mobility  Bed Mobility               General bed mobility comments: received in recliner     Transfers Overall transfer level: Needs assistance Equipment used: Rolling walker (2 wheels) Transfers: Sit to/from Stand, Bed to chair/wheelchair/BSC Sit to Stand: Contact guard assist           General transfer comment: safe technique with no cues for hand placement. RW used. 3L of O2 for all mobility    Ambulation/Gait Ambulation/Gait assistance: Contact guard assist Gait Distance (Feet): 20 Feet Assistive device: Rolling walker (2 wheels) Gait Pattern/deviations: Step-to pattern       General Gait Details: ambulated short distance in room on 3L with quick desat to 86% and increased SOB symptoms. Cues for pursed lip breathing and O2 returned back to 90%. Wheezing noted, further ambulation deferred.  Stairs            Wheelchair Mobility     Tilt Bed    Modified Rankin (Stroke Patients Only)       Balance Overall balance assessment: Needs assistance Sitting-balance support: Feet supported Sitting balance-Leahy Scale: Good     Standing balance support: Reliant on assistive device for balance Standing balance-Leahy Scale: Fair                               Pertinent Vitals/Pain Pain Assessment Pain Assessment: No/denies pain    Home Living Family/patient expects to be discharged to:: Group home Living Arrangements: Group Home Available Help at Discharge: Available 24 hours/day Type of Home: House Home Access: Stairs to enter Entrance Stairs-Rails: Can reach both;Left;Right Entrance Stairs-Number of Steps: 5   Home  Layout: One level Home Equipment: Grab bars - tub/shower;Shower seat      Prior Function Prior Level of Function : Independent/Modified Independent             Mobility Comments: Indep with ADls, household and community mobilization without assist device; 2L 02 at baseline; denies falls ADLs Comments: IND; staff provides meals and meds     Extremity/Trunk Assessment   Upper Extremity Assessment Upper Extremity  Assessment: Overall WFL for tasks assessed    Lower Extremity Assessment Lower Extremity Assessment: Generalized weakness (B LE grossly 4/5)       Communication   Communication Communication: No apparent difficulties    Cognition Arousal: Alert Behavior During Therapy: WFL for tasks assessed/performed   PT - Cognitive impairments: No apparent impairments                       PT - Cognition Comments: slight delay and difficulty speaking as dentures are not in place correctly Following commands: Intact       Cueing Cueing Techniques: Verbal cues     General Comments General comments (skin integrity, edema, etc.): on 3L at rest on entry with sp02 98%; dropped to 86% on 3L, required increase to 4L to return to 90-91%; left on 4L and notified RN    Exercises     Assessment/Plan    PT Assessment Patient needs continued PT services  PT Problem List Decreased strength;Decreased activity tolerance;Decreased balance;Decreased mobility;Decreased knowledge of use of DME;Cardiopulmonary status limiting activity       PT Treatment Interventions DME instruction;Gait training;Therapeutic exercise;Balance training    PT Goals (Current goals can be found in the Care Plan section)  Acute Rehab PT Goals Patient Stated Goal: to go home PT Goal Formulation: With patient Time For Goal Achievement: 11/01/23 Potential to Achieve Goals: Good    Frequency Min 2X/week     Co-evaluation               AM-PAC PT "6 Clicks" Mobility  Outcome Measure Help needed turning from your back to your side while in a flat bed without using bedrails?: None Help needed moving from lying on your back to sitting on the side of a flat bed without using bedrails?: None Help needed moving to and from a bed to a chair (including a wheelchair)?: A Little Help needed standing up from a chair using your arms (e.g., wheelchair or bedside chair)?: A Little Help needed to walk in hospital room?:  A Little Help needed climbing 3-5 steps with a railing? : A Lot 6 Click Score: 19    End of Session Equipment Utilized During Treatment: Oxygen Activity Tolerance: Patient tolerated treatment well Patient left: in bed;with bed alarm set Nurse Communication: Mobility status PT Visit Diagnosis: Unsteadiness on feet (R26.81);Muscle weakness (generalized) (M62.81);Difficulty in walking, not elsewhere classified (R26.2)    Time: 1610-9604 PT Time Calculation (min) (ACUTE ONLY): 13 min   Charges:   PT Evaluation $PT Eval Low Complexity: 1 Low   PT General Charges $$ ACUTE PT VISIT: 1 Visit         Elizabeth Palau, PT, DPT, GCS [phone]   Idonna Heeren 10/18/2023, 11:21 AM

## 2023-10-18 NOTE — Evaluation (Signed)
 Occupational Therapy Evaluation Patient Details Name: [name] MRN: [id number] DOB: 11-Aug-1960 Today's Date: 10/18/2023   History of Present Illness   Pt is a 63 y.o. male came to ED with complaint of shortness of breath for 4 days. Admitted for management of acute on chronic hypoxic respiratory failure, COPD exacerbation and Flu A. PMH of COPD, severe emphysema, adenocarcinoma of left lung, pulmonary embolism and depression.     Clinical Impressions Pt was seen for OT evaluation this date. PTA, pt was living at a group home where he reports being IND with ADLs and mobility with no AD use. Walk-in shower did have a shower chair that he utilized. He was using 2L 02.  Pt presents to acute OT demonstrating impaired ADL performance and functional mobility 2/2 low activity tolerance and weakness. Pt currently requires SUP for bed mobility. CGA for STS from EOB and CGA to Min A to ambulate to the bathroom and back. Pt with LOB while returning from the bathroom requiring Min A to prevent fall. Pt requiring increase in sp02 to 4L d/t drop to 86% and HR up to 120 with short distance mobility. Extended rest break prior to SPT using RW with CGA/SBA to sit up in recliner for breakfast. Pt left on 4L at 90-91% with nurse notified. Pt reports the staff at his group home can assist him if needed.  Pt would benefit from skilled OT services to address noted impairments and functional limitations (see below for any additional details) in order to maximize safety and independence while minimizing falls risk and caregiver burden. Do anticipate  the need for follow up OT services upon acute hospital DC.      If plan is discharge home, recommend the following:   A little help with walking and/or transfers;A little help with bathing/dressing/bathroom;Help with stairs or ramp for entrance     Functional Status Assessment   Patient has had a recent decline in their functional status and demonstrates the  ability to make significant improvements in function in a reasonable and predictable amount of time.     Equipment Recommendations   Other (comment);BSC/3in1 (RW)     Recommendations for Other Services         Precautions/Restrictions   Precautions Precautions: Fall Precaution/Restrictions Comments: watch 02 and HR Restrictions Weight Bearing Restrictions Per Provider Order: No     Mobility Bed Mobility Overal bed mobility: Needs Assistance Bed Mobility: Supine to Sit     Supine to sit: Supervision          Transfers Overall transfer level: Needs assistance   Transfers: Sit to/from Stand, Bed to chair/wheelchair/BSC Sit to Stand: Contact guard assist, Supervision Stand pivot transfers: Contact guard assist         General transfer comment: CGA for STS from EOB, ambulated with CGA to bathroom and back with no AD but needed Min A to prevent LOB x1; used RW for SPT to recliner with CGA/ SBA      Balance Overall balance assessment: Needs assistance Sitting-balance support: Feet supported Sitting balance-Leahy Scale: Good     Standing balance support: Reliant on assistive device for balance Standing balance-Leahy Scale: Fair Standing balance comment: X1 lob without RW, better stability with RW                           ADL either performed or assessed with clinical judgement   ADL Overall ADL's : Needs assistance/impaired  Toilet Transfer: Software engineer Details (indicate cue type and reason): standing at toilet with no AD use         Functional mobility during ADLs: Contact guard assist;Rolling walker (2 wheels)       Vision         Perception         Praxis         Pertinent Vitals/Pain Pain Assessment Pain Assessment: No/denies pain     Extremity/Trunk Assessment Upper Extremity Assessment Upper Extremity Assessment: Overall WFL for tasks assessed   Lower  Extremity Assessment Lower Extremity Assessment: Generalized weakness       Communication Communication Communication: No apparent difficulties   Cognition Arousal: Alert Behavior During Therapy: WFL for tasks assessed/performed Cognition: No apparent impairments                               Following commands: Intact       Cueing  General Comments   Cueing Techniques: Verbal cues  on 3L at rest on entry with sp02 98%; dropped to 86% on 3L, required increase to 4L to return to 90-91%; left on 4L and notified RN   Exercises Other Exercises Other Exercises: Edu on role of OT in acute setting.   Shoulder Instructions      Home Living Family/patient expects to be discharged to:: Group home Living Arrangements: Group Home Available Help at Discharge: Available 24 hours/day (staff) Type of Home: House Home Access: Stairs to enter [employer] of Steps: 5 Entrance Stairs-Rails: Can reach both;Left;Right Home Layout: One level     Bathroom Shower/Tub: Producer, television/film/video: Standard     Home Equipment: Grab bars - tub/shower;Shower seat          Prior Functioning/Environment Prior Level of Function : Independent/Modified Independent             Mobility Comments: Indep with ADls, household and community mobilization without assist device; 2L 02 at baseline; denies falls ADLs Comments: IND; staff provides meals and meds    OT Problem List: Decreased activity tolerance;Decreased strength;Impaired balance (sitting and/or standing)   OT Treatment/Interventions: Self-care/ADL training;Therapeutic exercise;Therapeutic activities;Energy conservation;Patient/family education;DME and/or AE instruction;Balance training      OT Goals(Current goals can be found in the care plan section)   Acute Rehab OT Goals Patient Stated Goal: improve breathing OT Goal Formulation: With patient Time For Goal Achievement: 11/01/23 Potential to  Achieve Goals: Fair ADL Goals Pt Will Perform Lower Body Bathing: with supervision;sit to/from stand;sitting/lateral leans Pt Will Perform Lower Body Dressing: with supervision;sit to/from stand;sitting/lateral leans Pt Will Transfer to Toilet: with supervision;ambulating;regular height toilet Additional ADL Goal #1: Pt will demo/verbalized implementation of 1 learned ECS 2/2 trials during ADL/mobility to prevent overexertion.   OT Frequency:  Min 2X/week    Co-evaluation              AM-PAC OT "6 Clicks" Daily Activity     Outcome Measure Help from another person eating meals?: None Help from another person taking care of personal grooming?: None Help from another person toileting, which includes using toliet, bedpan, or urinal?: A Little Help from another person bathing (including washing, rinsing, drying)?: A Lot Help from another person to put on and taking off regular upper body clothing?: A Little Help from another person to put on and taking off regular lower body clothing?: A Lot 6 Click Score: 18  End of Session Equipment Utilized During Treatment: Rolling walker (2 wheels) Nurse Communication: Mobility status  Activity Tolerance: Patient tolerated treatment well;Patient limited by fatigue Patient left: in chair;with call bell/phone within reach;with chair alarm set  OT Visit Diagnosis: Other abnormalities of gait and mobility (R26.89);Unsteadiness on feet (R26.81)                Time: 8413-2440 OT Time Calculation (min): 26 min Charges:  OT General Charges $OT Visit: 1 Visit OT Evaluation $OT Eval Moderate Complexity: 1 Mod OT Treatments $Self Care/Home Management : 8-22 mins Macio Kissoon, OTR/L 10/18/23, 10:51 AM  [name] 10/18/2023, 10:47 AM

## 2023-10-19 ENCOUNTER — Other Ambulatory Visit: Payer: Self-pay | Admitting: Oncology

## 2023-10-19 ENCOUNTER — Encounter: Payer: Self-pay | Admitting: Oncology

## 2023-10-19 DIAGNOSIS — J9621 Acute and chronic respiratory failure with hypoxia: Secondary | ICD-10-CM | POA: Diagnosis not present

## 2023-10-19 NOTE — Progress Notes (Signed)
 PHARMACY - PHYSICIAN COMMUNICATION CRITICAL VALUE ALERT - BLOOD CULTURE IDENTIFICATION (BCID)  [name] is an 63 y.o. male who presented to [facility] on 10/15/2023 with a chief complaint of shortness of breath  Assessment:  blood culture (repeat) from 3/11 with GPC in 1 of 4 bottles (took 48h to become positive). Previous blood cultures from 3/9 grew S. Hominis in 1 of 4 bottles and was deemed likely contaminant.  He is flu A positive completing oseltamivir.   Name of physician (or Provider) Contacted: Dr Fran Lowes  Current antibiotics: None  Changes to prescribed antibiotics recommended:  Monitoring off antibiotic for now. Physician evaluating    Juliette Alcide, PharmD, BCPS, BCIDP Work Cell: [phone] 10/19/2023 8:43 AM

## 2023-10-19 NOTE — Progress Notes (Signed)
 Physical Therapy Treatment Patient Details Name: [name] MRN: [id number] DOB: [birth year] Today's Date: 10/19/2023   History of Present Illness Pt is a 63 y.o. male came to ED with complaint of shortness of breath for 4 days. Admitted for management of acute on chronic hypoxic respiratory failure, COPD exacerbation and Flu A. PMH of COPD, severe emphysema, adenocarcinoma of left lung, pulmonary embolism and depression.    PT Comments  Provided O2 extender for increased mobility in room. Pt able to ambulate in room with improved endurance while on 4L decreasing to 91% with exertion. Able to ambulate laps in room as well as to bathroom. L LE appears slightly weaker compared to R side. Will continue to progress as able. Encouraged more mobility as pt is high risk for deterioration.   If plan is discharge home, recommend the following: A little help with walking and/or transfers;A little help with bathing/dressing/bathroom;Help with stairs or ramp for entrance;Assist for transportation   Can travel by private vehicle        Equipment Recommendations  Rolling walker (2 wheels)    Recommendations for Other Services       Precautions / Restrictions Precautions Precautions: Fall Recall of Precautions/Restrictions: Intact Precaution/Restrictions Comments: watch 02 and HR Restrictions Weight Bearing Restrictions Per Provider Order: No     Mobility  Bed Mobility Overal bed mobility: Needs Assistance Bed Mobility: Supine to Sit     Supine to sit: Modified independent (Device/Increase time)     General bed mobility comments: safe technique with ease of transfer    Transfers Overall transfer level: Needs assistance Equipment used: Rolling walker (2 wheels) Transfers: Sit to/from Stand, Bed to chair/wheelchair/BSC Sit to Stand: Supervision           General transfer comment: able to stand from low surface. Once standing, RW used. All mobility performed on 4L. RW used     Ambulation/Gait Ambulation/Gait assistance: Contact guard assist Gait Distance (Feet): 40 Feet Assistive device: Rolling walker (2 wheels) Gait Pattern/deviations: Step-through pattern       General Gait Details: ambulated several laps in room. No SOB symptoms noted, however still reports wheezing.   Stairs             Wheelchair Mobility     Tilt Bed    Modified Rankin (Stroke Patients Only)       Balance Overall balance assessment: Needs assistance Sitting-balance support: Feet supported Sitting balance-Leahy Scale: Good     Standing balance support: Reliant on assistive device for balance Standing balance-Leahy Scale: Fair                              Hotel manager: No apparent difficulties  Cognition Arousal: Alert Behavior During Therapy: WFL for tasks assessed/performed   PT - Cognitive impairments: No apparent impairments                       PT - Cognition Comments: slight delay and difficulty speaking as dentures are not in place correctly Following commands: Intact      Cueing Cueing Techniques: Verbal cues  Exercises Other Exercises Other Exercises: ambulated to bathroom with ability to perform self hygiene. Safe technique Other Exercises: supine ther-ex performed on B LE including foot presses on footboard, SLRs, and hip abd/add. Has increased difficulty with L vs R side. 5 reps performed    General Comments General comments (skin integrity, edema, etc.): 3L at  rest at 93%, bumped to 4L and dropped to 84%, needed 4.5 to improve to 93%, left on 4L with nurse present      Pertinent Vitals/Pain Pain Assessment Pain Assessment: No/denies pain    Home Living                          Prior Function            PT Goals (current goals can now be found in the care plan section) Acute Rehab PT Goals Patient Stated Goal: to go home PT Goal Formulation: With patient Time For  Goal Achievement: 11/01/23 Potential to Achieve Goals: Good Progress towards PT goals: Progressing toward goals    Frequency    Min 2X/week      PT Plan      Co-evaluation              AM-PAC PT "6 Clicks" Mobility   Outcome Measure  Help needed turning from your back to your side while in a flat bed without using bedrails?: None Help needed moving from lying on your back to sitting on the side of a flat bed without using bedrails?: None Help needed moving to and from a bed to a chair (including a wheelchair)?: A Little Help needed standing up from a chair using your arms (e.g., wheelchair or bedside chair)?: A Little Help needed to walk in hospital room?: A Little Help needed climbing 3-5 steps with a railing? : A Lot 6 Click Score: 19    End of Session Equipment Utilized During Treatment: Oxygen Activity Tolerance: Patient tolerated treatment well Patient left: in bed;with bed alarm set Nurse Communication: Mobility status PT Visit Diagnosis: Unsteadiness on feet (R26.81);Muscle weakness (generalized) (M62.81);Difficulty in walking, not elsewhere classified (R26.2)     Time: 2952-8413 PT Time Calculation (min) (ACUTE ONLY): 17 min  Charges:    $Gait Training: 8-22 mins PT General Charges $$ ACUTE PT VISIT: 1 Visit                     Elizabeth Palau, PT, DPT, GCS [phone]    Adeline Petitfrere 10/19/2023, 12:59 PM

## 2023-10-19 NOTE — Plan of Care (Signed)

## 2023-10-19 NOTE — Progress Notes (Signed)
  PROGRESS NOTE    [name]  VHQ:[id number] DOB: Jan 03, 1961 DOA: 10/15/2023 PCP: Emogene Morgan, MD  229A/229A-AA  LOS: 3 days   Brief hospital course:   Assessment & Plan:  [name] is a 63 y.o. male with medical history significant of COPD, severe emphysema, adenocarcinoma of left lung, pulmonary embolism and depression came to ED with complaint of shortness of breath for 4 days.  EMS found him on 83% with 4 L, oxygen was increased to 6 L.    On arrival patient was tachycardic and significantly tachypneic with increased work of breathing.  Labs seems stable.  Respiratory panel positive for influenza A.  Blood cultures were drawn. Chest x-ray with severe emphysema.  No active disease EKG, personally reviewed, sinus tachycardia, QTc 477   Patient received Solu-Medrol and breathing treatments in ED and started on Tamiflu.   * Acute on chronic hypoxic respiratory failure (HCC) EMS found him hypoxic at 83% and increased oxygen to 6 L, on arriving to ED he was placed on BiPAP due to significantly increased work of breathing.  Uses 2 L at baseline. Able to wean from BiPAP but requiring 5 to 6 L of oxygen --Continue supplemental O2 to keep sats >=90%, wean as tolerated  COPD exacerbation (HCC) Increased work of breathing and wheezing on presentation, likely secondary to influenza A infection.  Chest x-ray with no acute abnormalities.  Patient had underlying severe asthma and adenocarcinoma of left lung. --cont prednisone 40 mg daily --cont hypertonic saline neb BID with DuoNeb -Bronchodilators   Influenza A RVP with influenza A H3 --cont Tamiflu   Pulmonary embolism (HCC) --cont home Eliquis   Adenocarcinoma of lung (HCC) Currently on chemotherapy. -Continue outpatient follow-up   Depression --cont Prozac, Remeron and Seroquel   Insomnia -Continue home trazodone as needed  Hyperglycemia 2/2  Steroid use --BG checks qAM     DVT prophylaxis: UX:[id number] Code Status:  Full code  Family Communication:  Level of care: Telemetry Medical Dispo:   The patient is from: home Anticipated d/c is to: home Anticipated d/c date is: 2-3 days   Subjective and Interval History:  Pt reported breathing improved.   Objective: Vitals:   10/19/23 0345 10/19/23 0730 10/19/23 1224 10/19/23 1632  BP: (!) 138/99 (!) 146/97 135/83 (!) 160/101  Pulse: 73 79 96 79  Resp: 17 19 19 18   Temp: 97.9 F (36.6 C) 98.4 F (36.9 C) 98.1 F (36.7 C) 98.2 F (36.8 C)  TempSrc:  Oral Oral Oral  SpO2: 100% 100% 98% 94%  Weight:      Height:        Intake/Output Summary (Last 24 hours) at 10/19/2023 1816 Last data filed at 10/19/2023 0602 Gross per 24 hour  Intake --  Output 700 ml  Net -700 ml   Filed Weights   10/15/23 1513 10/17/23 2300  Weight: 105 kg 98.7 kg    Examination:   Constitutional: NAD, AAOx3 HEENT: conjunctivae and lids normal, EOMI CV: No cyanosis.   RESP: normal respiratory effort, no more rattling breath sounds Neuro: II - XII grossly intact.   Psych: Normal mood and affect.  Appropriate judgement and reason   Data Reviewed: I have personally reviewed labs and imaging studies  Time spent: 35 minutes  Darlin Priestly, MD [facility] If 7PM-7AM, please contact night-coverage 10/19/2023, 6:16 PM

## 2023-10-19 NOTE — Progress Notes (Signed)
       CROSS COVER NOTE  NAME: [name] MRN: [id number] DOB : [birth year] ATTENDING PHYSICIAN: Darlin Priestly, MD    Date of Service   10/19/2023   HPI/Events of Note   Nurse reports increased work of breathing   Interventions   Assessment/Plan:    10/19/2023    3:45 AM 10/18/2023   11:06 PM 10/18/2023    9:08 PM  Vitals with BMI  Systolic 138 148   Diastolic 99 102   Pulse 73 94 90    Latest Reference Range & Units 10/18/23 22:15  pH, Ven 7.25 - 7.43  7.39  pCO2, Ven 44 - 60 mmHg 53  pO2, Ven 32 - 45 mmHg 53 (H)  Acid-Base Excess 0.0 - 2.0 mmol/L 5.7 (H)  Bicarbonate 20.0 - 28.0 mmol/L 32.1 (H)  O2 Saturation % 83  Patient temperature  37.0  Collection site  VEIN  (H): Data is abnormally high  Denies chest pain  BBS diminished  BIPAP prn increase WOB Consider pulm consult to maximize emphysema treatment Flutter valve added No acute changes on xray - extensive COPD        Donnie Mesa NP [facility] Cross Cover 7pm-7am - check amion for availability Pager [phone]

## 2023-10-19 NOTE — Care Management Important Message (Signed)
 Important Message  Patient Details  Name: [name] MRN: [id number] Date of Birth: July 28, 1961   Important Message Given:  Yes - Medicare IM     Cristela Blue, CMA 10/19/2023, 10:30 AM

## 2023-10-19 NOTE — Progress Notes (Signed)
 Occupational Therapy Treatment Patient Details Name: [name] MRN: [id number] DOB: 09/22/1960 Today's Date: 10/19/2023   History of present illness Pt is a 63 y.o. male came to ED with complaint of shortness of breath for 4 days. Admitted for management of acute on chronic hypoxic respiratory failure, COPD exacerbation and Flu A. PMH of COPD, severe emphysema, adenocarcinoma of left lung, pulmonary embolism and depression.   OT comments  Pt is supine in bed on arrival. Pleasant and agreeable to OT session. He denies pain. Pt performed bed mobility MOD I, STS with SUP, and ~10 feet mobility using RW with CGA. STS from recliner multiple times with SUP to RW. Frequent rest breaks d/t increased WOB and low 02. Required 4-4.5L to recover to 93% after dropping to 84% on 4L.  Pt required SBA for LB bathing in standing at RW, set up for facewashing.  Pt left in recliner with all needs in place and will cont to require skilled acute OT services to maximize his safety and IND to return to PLOF.       If plan is discharge home, recommend the following:  A little help with walking and/or transfers;A little help with bathing/dressing/bathroom;Help with stairs or ramp for entrance   Equipment Recommendations  Other (comment);BSC/3in1 (RW)    Recommendations for Other Services      Precautions / Restrictions Precautions Precautions: Fall Recall of Precautions/Restrictions: Intact Restrictions Weight Bearing Restrictions Per Provider Order: No       Mobility Bed Mobility Overal bed mobility: Needs Assistance Bed Mobility: Supine to Sit     Supine to sit: Modified independent (Device/Increase time)          Transfers Overall transfer level: Needs assistance Equipment used: Rolling walker (2 wheels) Transfers: Sit to/from Stand, Bed to chair/wheelchair/BSC Sit to Stand: Contact guard assist, Supervision           General transfer comment: CGA/SUP for STS from EOB to RW, short  distance mobility ~10 feet total with return to recliner; needed 4L and dropped to 84% requiring 4.5 to recover to 93%     Balance Overall balance assessment: Needs assistance Sitting-balance support: Feet supported Sitting balance-Leahy Scale: Good     Standing balance support: Reliant on assistive device for balance Standing balance-Leahy Scale: Fair Standing balance comment: X1 lob without RW, better stability with RW                           ADL either performed or assessed with clinical judgement   ADL Overall ADL's : Needs assistance/impaired     Grooming: Wash/dry face;Sitting;Set up Grooming Details (indicate cue type and reason): in recliner     Lower Body Bathing: Contact guard assist;Sit to/from stand Lower Body Bathing Details (indicate cue type and reason): standing at RW/in front of recliner with SBA from therapist         Toilet Transfer: Contact guard assist;Supervision/safety;Rolling walker (2 wheels) Toilet Transfer Details (indicate cue type and reason): simulated to recliner                Extremity/Trunk Assessment              Vision       Perception     Praxis     Communication Communication Communication: No apparent difficulties   Cognition Arousal: Alert Behavior During Therapy: [facility] for tasks assessed/performed  Following commands: Intact        Cueing   Cueing Techniques: Verbal cues  Exercises Other Exercises Other Exercises: Edu on ECS, pacing, and PLB throughout session.    Shoulder Instructions       General Comments 3L at rest at 93%, bumped to 4L and dropped to 84%, needed 4.5 to improve to 93%, left on 4L with nurse present    Pertinent Vitals/ Pain       Pain Assessment Pain Assessment: No/denies pain  Home Living                                          Prior Functioning/Environment              Frequency  Min 2X/week         Progress Toward Goals  OT Goals(current goals can now be found in the care plan section)     Acute Rehab OT Goals Patient Stated Goal: improve breathing OT Goal Formulation: With patient Time For Goal Achievement: 11/01/23 Potential to Achieve Goals: Fair  Plan      Co-evaluation                 AM-PAC OT "6 Clicks" Daily Activity     Outcome Measure   Help from another person eating meals?: None Help from another person taking care of personal grooming?: None Help from another person toileting, which includes using toliet, bedpan, or urinal?: A Little Help from another person bathing (including washing, rinsing, drying)?: A Little Help from another person to put on and taking off regular upper body clothing?: A Little Help from another person to put on and taking off regular lower body clothing?: A Little 6 Click Score: 20    End of Session Equipment Utilized During Treatment: Rolling walker (2 wheels)  OT Visit Diagnosis: Other abnormalities of gait and mobility (R26.89);Unsteadiness on feet (R26.81)   Activity Tolerance Patient tolerated treatment well;Patient limited by fatigue   Patient Left in chair;with call bell/phone within reach;with chair alarm set   Nurse Communication Mobility status        Time: 3244-0102 OT Time Calculation (min): 18 min  Charges: OT General Charges $OT Visit: 1 Visit OT Treatments $Self Care/Home Management : 8-22 mins  [name], OTR/L  10/19/23, 11:49 AM   [name] 10/19/2023, 11:47 AM

## 2023-10-20 ENCOUNTER — Inpatient Hospital Stay: Payer: 59 | Attending: Oncology

## 2023-10-20 ENCOUNTER — Inpatient Hospital Stay: Payer: 59

## 2023-10-20 ENCOUNTER — Inpatient Hospital Stay: Payer: 59 | Admitting: Oncology

## 2023-10-20 DIAGNOSIS — J9621 Acute and chronic respiratory failure with hypoxia: Secondary | ICD-10-CM | POA: Diagnosis not present

## 2023-10-20 LAB — CULTURE, BLOOD (ROUTINE X 2)
Culture: NO GROWTH
Special Requests: ADEQUATE

## 2023-10-20 NOTE — Progress Notes (Signed)
  PROGRESS NOTE    Gerry Blanchfield Quam  AOZ:[id number] DOB: 28-Aug-1960 DOA: 10/15/2023 PCP: Emogene Morgan, MD  229A/229A-AA  LOS: 4 days   Brief hospital course:   Assessment & Plan:  Cian Costanzo Galla is a 63 y.o. male with medical history significant of COPD, severe emphysema, adenocarcinoma of left lung, pulmonary embolism and depression came to ED with complaint of shortness of breath for 4 days.  EMS found him on 83% with 4 L, oxygen was increased to 6 L.    On arrival patient was tachycardic and significantly tachypneic with increased work of breathing.  Labs seems stable.  Respiratory panel positive for influenza A.  Blood cultures were drawn. Chest x-ray with severe emphysema.  No active disease EKG, personally reviewed, sinus tachycardia, QTc 477   Patient received Solu-Medrol and breathing treatments in ED and started on Tamiflu.   * Acute on chronic hypoxic respiratory failure (HCC) 2L O2 at baseline EMS found him hypoxic at 83% and increased oxygen to 6 L, on arriving to ED he was placed on BiPAP due to significantly increased work of breathing.  Uses 2 L at baseline. Able to wean from BiPAP, now on 3L --Continue supplemental O2 to keep sats >=90%, wean as tolerated  COPD exacerbation (HCC) Increased work of breathing and wheezing on presentation, likely secondary to influenza A infection.  Chest x-ray with no acute abnormalities.  Patient had underlying severe asthma and adenocarcinoma of left lung. --completed steroid burst --cont hypertonic saline neb BID with DuoNeb   Influenza A RVP with influenza A H3 --cont Tamiflu   Pulmonary embolism (HCC) --cont home Eliquis   Adenocarcinoma of lung (HCC) Currently on chemotherapy. -Continue outpatient follow-up   Depression --cont Prozac, Remeron and Seroquel   Insomnia -Continue home trazodone as needed  Hyperglycemia 2/2  Steroid use --BG checks qAM     DVT prophylaxis: [id number] Code Status: Full code  Family  Communication:  Level of care: Telemetry Medical Dispo:   The patient is from: home Anticipated d/c is to: home Anticipated d/c date is: 2-3 days   Subjective and Interval History:  Pt reported dyspnea improved, but still far from baseline.  Reported some dry cough.   Objective: Vitals:   10/20/23 0326 10/20/23 0522 10/20/23 0805 10/20/23 1605  BP: 118/77  138/76 118/77  Pulse: 84  75 91  Resp: 18  17 18   Temp: 97.8 F (36.6 C)   98.6 F (37 C)  TempSrc:      SpO2: 100% 100% 100% 96%  Weight:      Height:        Intake/Output Summary (Last 24 hours) at 10/20/2023 1811 Last data filed at 10/20/2023 1738 Gross per 24 hour  Intake 480 ml  Output 1600 ml  Net -1120 ml   Filed Weights   10/15/23 1513 10/17/23 2300  Weight: 105 kg 98.7 kg    Examination:   Constitutional: NAD, AAOx3 HEENT: conjunctivae and lids normal, EOMI CV: No cyanosis.   RESP: normal respiratory effort, on 3L Neuro: II - XII grossly intact.   Psych: Normal mood and affect.  Appropriate judgement and reason   Data Reviewed: I have personally reviewed labs and imaging studies  Time spent: 35 minutes  Darlin Priestly, MD [facility] If 7PM-7AM, please contact night-coverage 10/20/2023, 6:11 PM

## 2023-10-20 NOTE — Plan of Care (Signed)
  Problem: Education: Goal: Knowledge of General Education information will improve Description: Including pain rating scale, medication(s)/side effects and non-pharmacologic comfort measures Outcome: Progressing   Problem: Health Behavior/Discharge Planning: Goal: Ability to manage health-related needs will improve Outcome: Progressing   Problem: Clinical Measurements: Goal: Ability to maintain clinical measurements within normal limits will improve Outcome: Progressing Goal: Will remain free from infection Outcome: Progressing Goal: Diagnostic test results will improve Outcome: Progressing Goal: Respiratory complications will improve Outcome: Progressing Goal: Cardiovascular complication will be avoided Outcome: Progressing   Problem: Activity: Goal: Risk for activity intolerance will decrease Outcome: Progressing   Problem: Nutrition: Goal: Adequate nutrition will be maintained Outcome: Progressing   Problem: Elimination: Goal: Will not experience complications related to bowel motility Outcome: Progressing Goal: Will not experience complications related to urinary retention Outcome: Progressing   Problem: Coping: Goal: Level of anxiety will decrease Outcome: Progressing   Problem: Pain Managment: Goal: General experience of comfort will improve and/or be controlled Outcome: Progressing   Problem: Safety: Goal: Ability to remain free from injury will improve Outcome: Progressing   Problem: Education: Goal: Knowledge of disease or condition will improve Outcome: Progressing Goal: Knowledge of the prescribed therapeutic regimen will improve Outcome: Progressing Goal: Individualized Educational Video(s) Outcome: Progressing   Problem: Respiratory: Goal: Ability to maintain a clear airway will improve Outcome: Progressing Goal: Levels of oxygenation will improve Outcome: Progressing Goal: Ability to maintain adequate ventilation will improve Outcome:  Progressing   Problem: Activity: Goal: Ability to tolerate increased activity will improve Outcome: Progressing Goal: Will verbalize the importance of balancing activity with adequate rest periods Outcome: Progressing

## 2023-10-20 NOTE — Progress Notes (Signed)
 Occupational Therapy Treatment Patient Details Name: [name] MRN: [id number] DOB: [birth year] Today's Date: 10/20/2023   History of present illness Pt is a 63 y.o. male came to ED with complaint of shortness of breath for 4 days. Admitted for management of acute on chronic hypoxic respiratory failure, COPD exacerbation and Flu A. PMH of COPD, severe emphysema, adenocarcinoma of left lung, pulmonary embolism and depression.   OT comments  Pt is supine in bed on arrival. Easily arousable and agreeable to OT session. He denies pain. Pt performed bed mobility with MOD I. Pt found soiled of urine and BM requiring full linen and gown change. Pt required SUP for STS from EOB to RW and CGA for mobility to the bathroom and back. CGA/SBA for toilet transfer, SUP for posterior hygiene seated on toilet with multiple rest breaks d/t fatigue and WOB. Needed 4L during activity with drop to 86%. CGA/SBA for peri-care in standing at sink for thoroughness prior to sitting in recliner. Pt recovered well and placed back on 3.5L at 92%. Pt left with all needs in place and will cont to require skilled acute OT services to maximize his safety and IND to return to PLOF.       If plan is discharge home, recommend the following:  A little help with walking and/or transfers;A little help with bathing/dressing/bathroom;Help with stairs or ramp for entrance   Equipment Recommendations  Other (comment);BSC/3in1 (RW)    Recommendations for Other Services      Precautions / Restrictions Precautions Precautions: Fall Recall of Precautions/Restrictions: Intact Precaution/Restrictions Comments: watch 02 and HR Restrictions Weight Bearing Restrictions Per Provider Order: No       Mobility Bed Mobility Overal bed mobility: Needs Assistance Bed Mobility: Supine to Sit     Supine to sit: Modified independent (Device/Increase time)          Transfers Overall transfer level: Needs assistance Equipment used:  Rolling walker (2 wheels) Transfers: Sit to/from Stand Sit to Stand: Supervision           General transfer comment: SUP for STS from EOB to RW and CGA for mobility to the bathroom and back; started on 3.5 L needed increase to 4L d/t dropping to 86%     Balance Overall balance assessment: Needs assistance Sitting-balance support: Feet supported Sitting balance-Leahy Scale: Good     Standing balance support: Reliant on assistive device for balance Standing balance-Leahy Scale: Fair Standing balance comment: RW and CGA                           ADL either performed or assessed with clinical judgement   ADL Overall ADL's : Needs assistance/impaired             Lower Body Bathing: Contact guard assist;Sit to/from stand Lower Body Bathing Details (indicate cue type and reason): standing at RW/in front of sink with SBA from therapist Upper Body Dressing : Minimal assistance Upper Body Dressing Details (indicate cue type and reason): to manage tele monitor     Toilet Transfer: Contact guard assist;Rolling walker (2 wheels)   Toileting- Clothing Manipulation and Hygiene: Supervision/safety;Sitting/lateral lean       Functional mobility during ADLs: Contact guard assist;Rolling walker (2 wheels)      Extremity/Trunk Assessment              Vision       Perception     Praxis     Communication Communication Communication:  No apparent difficulties   Cognition Arousal: Alert Behavior During Therapy: WFL for tasks assessed/performed                                 Following commands: Intact        Cueing   Cueing Techniques: Verbal cues  Exercises      Shoulder Instructions       General Comments      Pertinent Vitals/ Pain       Pain Assessment Pain Assessment: No/denies pain  Home Living                                          Prior Functioning/Environment              Frequency  Min  2X/week        Progress Toward Goals  OT Goals(current goals can now be found in the care plan section)  Progress towards OT goals: Progressing toward goals  Acute Rehab OT Goals Patient Stated Goal: improve breathing OT Goal Formulation: With patient Time For Goal Achievement: 11/01/23 Potential to Achieve Goals: Fair  Plan      Co-evaluation                 AM-PAC OT "6 Clicks" Daily Activity     Outcome Measure   Help from another person eating meals?: None Help from another person taking care of personal grooming?: None Help from another person toileting, which includes using toliet, bedpan, or urinal?: A Little Help from another person bathing (including washing, rinsing, drying)?: A Little Help from another person to put on and taking off regular upper body clothing?: A Little Help from another person to put on and taking off regular lower body clothing?: A Little 6 Click Score: 20    End of Session Equipment Utilized During Treatment: Rolling walker (2 wheels)  OT Visit Diagnosis: Other abnormalities of gait and mobility (R26.89);Unsteadiness on feet (R26.81)   Activity Tolerance Patient tolerated treatment well;Patient limited by fatigue   Patient Left in chair;with call bell/phone within reach;with chair alarm set   Nurse Communication          Time: [phone] OT Time Calculation (min): 27 min  Charges: OT General Charges $OT Visit: 1 Visit OT Treatments $Self Care/Home Management : 23-37 mins  Salil Raineri, OTR/L  10/20/23, 1:49 PM   Mujtaba Bollig E Shaquela Weichert 10/20/2023, 1:47 PM

## 2023-10-20 NOTE — Plan of Care (Signed)

## 2023-10-20 NOTE — TOC Progression Note (Signed)
 Transition of Care [facility]) - Progression Note    Patient Details  Name: [name] MRN: [id number] Date of Birth: 02-11-1961  [facility]) CM/SW Contact  Liliana Cline, LCSW Phone Number: 10/20/2023, 3:26 PM  Clinical Narrative:    CSW spoke with [name] from [facility]. He states their pharmacy is closed on weekends so they can only take patient back Friday or Monday. He states they can only use [employer] not a Insurance claims handler. He states they cannot provide transport on weekends.  Checked with MD, estimated DC Monday. Called [name] back, left a VM requesting a return call to inform him of this update.  Expected Discharge Plan: Group Home (with home health) Barriers to Discharge: Continued Medical Work up  Expected Discharge Plan and Services     Post Acute Care Choice: Resumption of Svcs/PTA Provider Living arrangements for the past 2 months: Group Home                           HH Arranged: RN, PT [facility] Agency: [facility] Date [facility] Agency Contacted: 10/16/23   Representative spoke with at [facility] Agency: Elnita Maxwell   Social Determinants of Health (SDOH) Interventions SDOH Screenings   Food Insecurity: No Food Insecurity (10/17/2023)  Housing: Low Risk  (10/17/2023)  Transportation Needs: No Transportation Needs (10/17/2023)  Utilities: Not At Risk (10/17/2023)  Alcohol Screen: Low Risk  (06/16/2023)  Depression (PHQ2-9): Low Risk  (02/03/2023)  Financial Resource Strain: Medium Risk (08/18/2023)   Received from [facility]  Tobacco Use: Medium Risk (10/15/2023)    Readmission Risk Interventions    10/16/2023    1:34 PM 05/26/2023    2:38 PM  Readmission Risk Prevention Plan  Transportation Screening  Complete  PCP or Specialist Appt within 3-5 Days  Complete  HRI or Home Care Consult  Complete  Social Work Consult for Recovery Care Planning/Counseling  Complete  Palliative Care Screening  Not Applicable   Medication Review [employer]) Complete Complete  PCP or Specialist appointment within 3-5 days of discharge Complete   HRI or Home Care Consult Complete   SW Recovery Care/Counseling Consult Complete   Palliative Care Screening Not Applicable   Skilled Nursing Facility Not Applicable

## 2023-10-21 DIAGNOSIS — J9621 Acute and chronic respiratory failure with hypoxia: Secondary | ICD-10-CM | POA: Diagnosis not present

## 2023-10-21 MED ORDER — IPRATROPIUM-ALBUTEROL 0.5-2.5 (3) MG/3ML IN SOLN
3.0000 mL | Freq: Three times a day (TID) | RESPIRATORY_TRACT | Status: DC
  Administered 2023-10-21 – 2023-10-22 (×4): 3 mL via RESPIRATORY_TRACT
  Filled 2023-10-21 (×4): qty 3

## 2023-10-21 NOTE — Progress Notes (Signed)
  PROGRESS NOTE    [name]  EAV:[id number] DOB: 1961-03-01 DOA: 10/15/2023 PCP: Emogene Morgan, MD  229A/229A-AA  LOS: 5 days   Brief hospital course:   Assessment & Plan:  [name] is a 63 y.o. male with medical history significant of COPD, severe emphysema, adenocarcinoma of left lung, pulmonary embolism and depression came to ED with complaint of shortness of breath for 4 days.  EMS found him on 83% with 4 L, oxygen was increased to 6 L.    On arrival patient was tachycardic and significantly tachypneic with increased work of breathing.  Labs seems stable.  Respiratory panel positive for influenza A.  Blood cultures were drawn. Chest x-ray with severe emphysema.  No active disease EKG, personally reviewed, sinus tachycardia, QTc 477   Patient received Solu-Medrol and breathing treatments in ED and started on Tamiflu.   * Acute on chronic hypoxic respiratory failure (HCC) 2L O2 at baseline EMS found him hypoxic at 83% and increased oxygen to 6 L, on arriving to ED he was placed on BiPAP due to significantly increased work of breathing.  Uses 2 L at baseline. Able to wean from BiPAP, now on 3L --Continue supplemental O2 to keep sats >=90%, wean as tolerated  COPD exacerbation (HCC) Increased work of breathing and wheezing on presentation, likely secondary to influenza A infection.  Chest x-ray with no acute abnormalities.  Patient had underlying severe asthma and adenocarcinoma of left lung. --completed steroid burst --cont hypertonic saline neb BID with DuoNeb --cont bronchodilators   Influenza A RVP with influenza A H3 Completed Tamiflu   Pulmonary embolism (HCC) --cont home Eliquis   Adenocarcinoma of lung (HCC) Currently on chemotherapy. -Continue outpatient follow-up   Depression --cont Prozac, Remeron and Seroquel   Insomnia -Continue home trazodone as needed  Hyperglycemia 2/2  Steroid use --BG checks qAM     DVT prophylaxis: [id number] Code  Status: Full code  Family Communication:  Level of care: Telemetry Medical Dispo:   The patient is from: home Anticipated d/c is to: home Anticipated d/c date is: 1-2 days   Subjective and Interval History:  Pt reported breathing a little bit better.   Objective: Vitals:   10/21/23 0420 10/21/23 0746 10/21/23 0858 10/21/23 1357  BP: 119/83  (!) 144/95   Pulse: 81  88   Resp: 20  20   Temp: 98 F (36.7 C)  98.2 F (36.8 C)   TempSrc:      SpO2: 98% 92% 95% 96%  Weight:      Height:        Intake/Output Summary (Last 24 hours) at 10/21/2023 1640 Last data filed at 10/21/2023 1459 Gross per 24 hour  Intake --  Output 3308 ml  Net -3308 ml   Filed Weights   10/15/23 1513 10/17/23 2300  Weight: 105 kg 98.7 kg    Examination:   Constitutional: NAD, AAOx3 HEENT: conjunctivae and lids normal, EOMI CV: No cyanosis.   RESP: normal respiratory effort Neuro: II - XII grossly intact.   Psych: Normal mood and affect.  Appropriate judgement and reason   Data Reviewed: I have personally reviewed labs and imaging studies  Time spent: 35 minutes  Darlin Priestly, MD [facility] If 7PM-7AM, please contact night-coverage 10/21/2023, 4:40 PM

## 2023-10-21 NOTE — Plan of Care (Signed)

## 2023-10-22 DIAGNOSIS — J9621 Acute and chronic respiratory failure with hypoxia: Secondary | ICD-10-CM | POA: Diagnosis not present

## 2023-10-22 LAB — CULTURE, BLOOD (ROUTINE X 2): Culture: NO GROWTH

## 2023-10-22 MED ORDER — IPRATROPIUM-ALBUTEROL 0.5-2.5 (3) MG/3ML IN SOLN
3.0000 mL | Freq: Three times a day (TID) | RESPIRATORY_TRACT | Status: DC
  Administered 2023-10-22 – 2023-10-23 (×3): 3 mL via RESPIRATORY_TRACT
  Filled 2023-10-22 (×3): qty 3

## 2023-10-22 MED ORDER — SODIUM CHLORIDE 3 % IN NEBU
4.0000 mL | INHALATION_SOLUTION | Freq: Two times a day (BID) | RESPIRATORY_TRACT | Status: DC
Start: 2023-10-22 — End: 2023-10-25
  Administered 2023-10-22 – 2023-10-24 (×5): 4 mL via RESPIRATORY_TRACT
  Filled 2023-10-22 (×6): qty 4

## 2023-10-22 NOTE — Progress Notes (Signed)
  PROGRESS NOTE    [name]  ZOX:[id number] DOB: 08/25/1960 DOA: 10/15/2023 PCP: Emogene Morgan, MD  229A/229A-AA  LOS: 6 days   Brief hospital course:   Assessment & Plan:  [name] is a 63 y.o. male with medical history significant of COPD, severe emphysema, adenocarcinoma of left lung, pulmonary embolism and depression came to ED with complaint of shortness of breath for 4 days.  EMS found him on 83% with 4 L, oxygen was increased to 6 L.    On arrival patient was tachycardic and significantly tachypneic with increased work of breathing.  Labs seems stable.  Respiratory panel positive for influenza A.  Blood cultures were drawn. Chest x-ray with severe emphysema.  No active disease EKG, personally reviewed, sinus tachycardia, QTc 477   Patient received Solu-Medrol and breathing treatments in ED and started on Tamiflu.   * Acute on chronic hypoxic respiratory failure (HCC) 2L O2 at baseline EMS found him hypoxic at 83% and increased oxygen to 6 L, on arriving to ED he was placed on BiPAP due to significantly increased work of breathing.  Uses 2 L at baseline. Able to wean from BiPAP, now on 3L --Continue supplemental O2 to keep sats >=90%, wean as tolerated  COPD exacerbation (HCC) Increased work of breathing and wheezing on presentation, likely secondary to influenza A infection.  Chest x-ray with no acute abnormalities.  Patient had underlying severe asthma and adenocarcinoma of left lung. --completed steroid burst --cont hypertonic saline neb BID with DuoNeb --cont bronchodilators   Influenza A RVP with influenza A H3 Completed Tamiflu   Pulmonary embolism (HCC) --cont home Eliquis   Adenocarcinoma of lung (HCC) Currently on chemotherapy. -Continue outpatient follow-up   Depression --cont Prozac, Remeron and Seroquel   Insomnia -Continue home trazodone as needed  Hyperglycemia 2/2  Steroid use --BG checks qAM     DVT prophylaxis: [id number] Code  Status: Full code  Family Communication:  Level of care: Telemetry Medical Dispo:   The patient is from: home Anticipated d/c is to: home Anticipated d/c date is: tomorrow   Subjective and Interval History:  Pt reported he was not ready to go home yet.   Objective: Vitals:   10/21/23 2157 10/22/23 0900 10/22/23 1339 10/22/23 1349  BP: 133/87 127/78  115/64  Pulse: (!) 105 91  93  Resp:  18  18  Temp: (!) 97.5 F (36.4 C) 98.1 F (36.7 C)  97.9 F (36.6 C)  TempSrc: Oral Oral    SpO2: 96% 90% 95% 98%  Weight:      Height:        Intake/Output Summary (Last 24 hours) at 10/22/2023 1455 Last data filed at 10/22/2023 0900 Gross per 24 hour  Intake 0 ml  Output 3508 ml  Net -3508 ml   Filed Weights   10/15/23 1513 10/17/23 2300  Weight: 105 kg 98.7 kg    Examination:   Constitutional: NAD, sleeping but arousable CV: No cyanosis.   RESP: normal respiratory effort, on 3L Neuro: II - XII grossly intact.   Psych: Normal mood and affect.  Appropriate judgement and reason   Data Reviewed: I have personally reviewed labs and imaging studies  Time spent: 25 minutes  Darlin Priestly, MD [facility] If 7PM-7AM, please contact night-coverage 10/22/2023, 2:55 PM

## 2023-10-22 NOTE — Progress Notes (Signed)
 PT Cancellation Note  Patient Details Name: [name] MRN: [id number] DOB: 1961-01-11   Cancelled Treatment:    Reason Eval/Treat Not Completed: Patient declined, no reason specified. Pt refusing to participate in PT session this date, stating "I'm just not feeling it today". PT will continue to follow-up with pt acutely as available and appropriate.    Alessandra Bevels Logyn Dedominicis 10/22/2023, 10:23 AM

## 2023-10-23 DIAGNOSIS — J9621 Acute and chronic respiratory failure with hypoxia: Secondary | ICD-10-CM | POA: Diagnosis not present

## 2023-10-23 LAB — BASIC METABOLIC PANEL
Anion gap: 6 (ref 5–15)
BUN: 8 mg/dL (ref 8–23)
CO2: 31 mmol/L (ref 22–32)
Calcium: 8.7 mg/dL — ABNORMAL LOW (ref 8.9–10.3)
Chloride: 101 mmol/L (ref 98–111)
Creatinine, Ser: 0.58 mg/dL — ABNORMAL LOW (ref 0.61–1.24)
GFR, Estimated: >60 mL/min (ref >60)
Glucose, Bld: 110 mg/dL — ABNORMAL HIGH (ref 70–99)
Potassium: 3.2 mmol/L — ABNORMAL LOW (ref 3.5–5.1)
Sodium: 138 mmol/L (ref 135–145)

## 2023-10-23 LAB — CULTURE, BLOOD (ROUTINE X 2): Special Requests: ADEQUATE

## 2023-10-23 LAB — CBC
HCT: 30 % — ABNORMAL LOW (ref 39.0–52.0)
Hemoglobin: 9.8 g/dL — ABNORMAL LOW (ref 13.0–17.0)
MCH: 28.1 pg (ref 26.0–34.0)
MCHC: 32.7 g/dL (ref 30.0–36.0)
MCV: 86 fL (ref 80.0–100.0)
Platelets: 230 10*3/uL (ref 150–400)
RBC: 3.49 MIL/uL — ABNORMAL LOW (ref 4.22–5.81)
RDW: 15.5 % (ref 11.5–15.5)
WBC: 5.9 10*3/uL (ref 4.0–10.5)
nRBC: 0 % (ref 0.0–0.2)

## 2023-10-23 LAB — MAGNESIUM: Magnesium: 1.8 mg/dL (ref 1.7–2.4)

## 2023-10-23 MED ORDER — IPRATROPIUM-ALBUTEROL 0.5-2.5 (3) MG/3ML IN SOLN
3.0000 mL | Freq: Two times a day (BID) | RESPIRATORY_TRACT | Status: DC
  Administered 2023-10-23 – 2023-10-24 (×2): 3 mL via RESPIRATORY_TRACT
  Filled 2023-10-23 (×3): qty 3

## 2023-10-23 MED ORDER — POTASSIUM CHLORIDE CRYS ER 20 MEQ PO TBCR
40.0000 meq | EXTENDED_RELEASE_TABLET | Freq: Once | ORAL | Status: AC
  Administered 2023-10-23: 40 meq via ORAL
  Filled 2023-10-23: qty 2

## 2023-10-23 NOTE — Plan of Care (Signed)

## 2023-10-23 NOTE — Discharge Summary (Signed)
 Physician Discharge Summary   [name]  male DOB: [birth year]  NGE:[id number]  PCP: Emogene Morgan, MD  Admit date: 10/15/2023 Discharge date: 10/24/2023  Admitted From: group home Disposition:  group home ALF CODE STATUS: Full code   Hospital Course:  For full details, please see H&P, progress notes, consult notes and ancillary notes.  Briefly,  [name] is a 63 y.o. male with medical history significant of COPD, severe emphysema, adenocarcinoma of left lung, pulmonary embolism who came to ED with complaint of shortness of breath for 4 days.  EMS found him on 83% with 4 L, oxygen was increased to 6 L.    On arrival patient was tachycardic and significantly tachypneic with increased work of breathing.  Respiratory panel positive for influenza A.  Chest x-ray with severe emphysema.  No active disease.  Patient received Solu-Medrol and breathing treatments in ED and started on Tamiflu.    * Acute hypoxic respiratory failure (HCC) Ruled out chronic hypoxic respiratory failure EMS found him hypoxic at 83% and increased oxygen to 6 L, on arriving to ED he was placed on BiPAP due to significantly increased work of breathing.  Pt initially reported using 2L O2 at baseline, but group home owner confirmed pt does not use supplemental O2 at baseline.  (Though apparently pt bought a concentrator on his own without facility approval). --weaned down to room air prior to discharge. O2 sats 93% on room air when walking prior to discharge.    COPD exacerbation (HCC) Increased work of breathing and wheezing on presentation, likely secondary to influenza A infection.  Chest x-ray with no acute abnormalities.  Patient had underlying severe asthma and adenocarcinoma of left lung. --completed steroid burst --received hypertonic saline neb BID with DuoNeb --cont home bronchodilators   Influenza A RVP with influenza A H3 Completed Tamiflu   Pulmonary embolism (HCC) --cont home Eliquis    Adenocarcinoma of lung (HCC) --follows outpatient with Dr. Cathie Hoops.  Finished chemo radiation and has been on immunotherapy maintenance.  Last chemo was last year    Depression --cont Prozac, Remeron and Seroquel   Insomnia -Continue home trazodone as needed   Hyperglycemia 2/2  Steroid use  HTN, not currently active --BP low normal without BP med. --d/c'ed home cardizem  1/4 blood cx pos for Staphylococcus hominis from 10/15/23 and 10/17/23 likely contaminant. No fever, no leukocytosis, likely contaminant.   Discharge Diagnoses:  Principal Problem:   Acute on chronic hypoxic respiratory failure (HCC) Active Problems:   COPD exacerbation (HCC)   Influenza A   Pulmonary embolism (HCC)   Adenocarcinoma of lung (HCC)   Depression   Insomnia   30 Day Unplanned Readmission Risk Score    Flowsheet Row ED to Hosp-Admission (Current) from 10/15/2023 in [facility]  30 Day Unplanned Readmission Risk Score (%) 39.81 Filed at 10/23/2023 0801       This score is the patient's risk of an unplanned readmission within 30 days of being discharged (0 -100%). The score is based on dignosis, age, lab data, medications, orders, and past utilization.   Low:  0-14.9   Medium: 15-21.9   High: 22-29.9   Extreme: 30 and above         Discharge Instructions:  Allergies as of 10/23/2023   No Known Allergies      Medication List     STOP taking these medications    diltiazem 240 MG 24 hr capsule Commonly known as: CARDIZEM  CD   umeclidinium bromide 62.5 MCG/ACT Aepb Commonly known as: INCRUSE ELLIPTA       TAKE these medications    albuterol 108 (90 Base) MCG/ACT inhaler Commonly known as: VENTOLIN HFA Inhale 1-2 puffs into the lungs every 6 (six) hours as needed for wheezing or shortness of breath.   apixaban 5 MG Tabs tablet Commonly known as: Eliquis Take 1 tablet (5 mg total) by mouth 2 (two) times daily. What changed: Another  medication with the same name was removed. Continue taking this medication, and follow the directions you see here.   clopidogrel 75 MG tablet Commonly known as: PLAVIX Take 1 tablet (75 mg total) by mouth daily.   famotidine 20 MG tablet Commonly known as: PEPCID Take 1 tablet (20 mg total) by mouth daily.   FLUoxetine 20 MG capsule Commonly known as: PROZAC Take 1 capsule (20 mg total) by mouth daily.   guaiFENesin 600 MG 12 hr tablet Commonly known as: MUCINEX Take 1 tablet (600 mg total) by mouth 2 (two) times daily.   ipratropium-albuterol 0.5-2.5 (3) MG/3ML Soln Commonly known as: DUONEB Take 3 mLs by nebulization 3 (three) times daily.   mirtazapine 7.5 MG tablet Commonly known as: REMERON Take 1 tablet (7.5 mg total) by mouth at bedtime.   montelukast 10 MG tablet Commonly known as: SINGULAIR Take 1 tablet (10 mg total) by mouth at bedtime.   potassium chloride 10 MEQ tablet Commonly known as: KLOR-CON M Take 1 tablet (10 mEq total) by mouth daily.   pravastatin 20 MG tablet Commonly known as: PRAVACHOL Take 1 tablet (20 mg total) by mouth daily at 6 PM.   QUEtiapine 300 MG tablet Commonly known as: SEROQUEL Take 2 tablets (600 mg total) by mouth at bedtime.   senna-docusate 8.6-50 MG tablet Commonly known as: Senokot-S Take 1 tablet by mouth daily after breakfast.   traZODone 100 MG tablet Commonly known as: DESYREL Take 2 tablets (200 mg total) by mouth at bedtime as needed for sleep.   Trelegy Ellipta 100-62.5-25 MCG/ACT Aepb Generic drug: Fluticasone-Umeclidin-Vilant Inhale 1 puff into the lungs daily.         Follow-up Information     Aycock, Ngwe A, MD Follow up in 1 week(s).   Specialty: Family Medicine Contact information: [address] [place] [address] [phone]                 No Known Allergies   The results of significant diagnostics from this hospitalization (including imaging, microbiology,  ancillary and laboratory) are listed below for reference.   Consultations:   Procedures/Studies: DG Chest Port 1 View Result Date: 10/18/2023 CLINICAL DATA:  Respiratory distress EXAM: PORTABLE CHEST 1 VIEW COMPARISON:  10/15/2023, CT chest 07/12/2023 FINDINGS: Hyperinflation with emphysema. Focus of distortion in the left upper lobe corresponding to CT demonstrated spiculated nodule. Normal cardiomediastinal silhouette with aortic atherosclerosis. IMPRESSION: 1. Hyperinflation with emphysema. 2. Focus of distortion in the left upper lobe corresponding to CT demonstrated spiculated nodule, reference chest CT 07/12/2023 Electronically Signed   By: Jasmine Pang M.D.   On: 10/18/2023 23:35   DG Chest Port 1 View Result Date: 10/15/2023 CLINICAL DATA:  Worsening shortness of breath for 2 days.  COPD. EXAM: PORTABLE CHEST 1 VIEW COMPARISON:  06/13/2023 FINDINGS: The heart size and mediastinal contours are within normal limits. Severe emphysema again noted. No No evidence of acute infiltrate or edema. No pleural effusion. IMPRESSION: Severe emphysema. No active disease. Electronically Signed  By: Danae Orleans M.D.   On: 10/15/2023 15:59      Labs: BNP (last 3 results) Recent Labs    10/18/23 2215  BNP 61.1   Basic Metabolic Panel: Recent Labs  Lab 10/18/23 0551 10/18/23 1840 10/23/23 0639  NA 140  --  138  K 4.4  --  3.2*  CL 103  --  101  CO2 27  --  31  GLUCOSE 121*  --  110*  BUN 16  --  8  CREATININE 0.52*  --  0.58*  CALCIUM 9.1  --  8.7*  MG  --  2.2 1.8   Liver Function Tests: No results for input(s): "AST", "ALT", "ALKPHOS", "BILITOT", "PROT", "ALBUMIN" in the last 168 hours. No results for input(s): "LIPASE", "AMYLASE" in the last 168 hours. No results for input(s): "AMMONIA" in the last 168 hours. CBC: Recent Labs  Lab 10/18/23 0551 10/23/23 0639  WBC 5.3 5.9  HGB 10.1* 9.8*  HCT 31.6* 30.0*  MCV 87.5 86.0  PLT 140* 230   Cardiac Enzymes: No results for  input(s): "CKTOTAL", "CKMB", "CKMBINDEX", "TROPONINI" in the last 168 hours. BNP: Invalid input(s): "POCBNP" CBG: No results for input(s): "GLUCAP" in the last 168 hours. D-Dimer No results for input(s): "DDIMER" in the last 72 hours. Hgb A1c No results for input(s): "HGBA1C" in the last 72 hours. Lipid Profile No results for input(s): "CHOL", "HDL", "LDLCALC", "TRIG", "CHOLHDL", "LDLDIRECT" in the last 72 hours. Thyroid function studies No results for input(s): "TSH", "T4TOTAL", "T3FREE", "THYROIDAB" in the last 72 hours.  Invalid input(s): "FREET3" Anemia work up No results for input(s): "VITAMINB12", "FOLATE", "FERRITIN", "TIBC", "IRON", "RETICCTPCT" in the last 72 hours. Urinalysis    Component Value Date/Time   COLORURINE YELLOW (A) 10/16/2023 0403   APPEARANCEUR HAZY (A) 10/16/2023 0403   APPEARANCEUR Clear 12/27/2013 1921   LABSPEC 1.023 10/16/2023 0403   LABSPEC 1.002 12/27/2013 1921   PHURINE 5.0 10/16/2023 0403   GLUCOSEU NEGATIVE 10/16/2023 0403   GLUCOSEU Negative 12/27/2013 1921   HGBUR NEGATIVE 10/16/2023 0403   BILIRUBINUR NEGATIVE 10/16/2023 0403   BILIRUBINUR Negative 12/27/2013 1921   KETONESUR NEGATIVE 10/16/2023 0403   PROTEINUR 30 (A) 10/16/2023 0403   NITRITE NEGATIVE 10/16/2023 0403   LEUKOCYTESUR NEGATIVE 10/16/2023 0403   LEUKOCYTESUR Negative 12/27/2013 1921   Sepsis Labs Recent Labs  Lab 10/18/23 0551 10/23/23 0639  WBC 5.3 5.9   Microbiology Recent Results (from the past 240 hours)  Culture, blood (Routine x 2)     Status: Abnormal   Collection Time: 10/15/23  3:22 PM   Specimen: BLOOD  Result Value Ref Range Status   Specimen Description   Final    BLOOD LEFT ANTECUBITAL Performed at [facility], [address], [place], [place] [address]    Special Requests   Final    BOTTLES DRAWN AEROBIC AND ANAEROBIC Blood Culture adequate volume Performed at [facility], [address] [place], [place] [address]     Culture  Setup Time   Final    GRAM POSITIVE COCCI AEROBIC BOTTLE ONLY Organism ID to follow CRITICAL RESULT CALLED TO, READ BACK BY AND VERIFIED WITHPrincella Ion [facility] 0981 10/17/23 HNM Performed at [facility], [address] [place], [place] [address]    Culture STAPHYLOCOCCUS HOMINIS (A)  Final   Report Status 10/23/2023 FINAL  Final   Organism ID, Bacteria STAPHYLOCOCCUS HOMINIS  Final      Susceptibility   Staphylococcus hominis - MIC*  CIPROFLOXACIN <=0.5 SENSITIVE Sensitive     ERYTHROMYCIN <=0.25 SENSITIVE Sensitive     GENTAMICIN <=0.5 SENSITIVE Sensitive     OXACILLIN <=0.25 SENSITIVE Sensitive     TETRACYCLINE >=16 RESISTANT Resistant     VANCOMYCIN 1 SENSITIVE Sensitive     TRIMETH/SULFA <=10 SENSITIVE Sensitive     CLINDAMYCIN <=0.25 SENSITIVE Sensitive     RIFAMPIN <=0.5 SENSITIVE Sensitive     Inducible Clindamycin NEGATIVE Sensitive     * STAPHYLOCOCCUS HOMINIS  Culture, blood (Routine x 2)     Status: None   Collection Time: 10/15/23  3:22 PM   Specimen: BLOOD LEFT ARM  Result Value Ref Range Status   Specimen Description BLOOD LEFT ARM  Final   Special Requests   Final    BOTTLES DRAWN AEROBIC AND ANAEROBIC Blood Culture adequate volume   Culture   Final    NO GROWTH 5 DAYS Performed at [facility], [address] [place], [place] [address]    Report Status 10/20/2023 FINAL  Final  Resp panel by RT-PCR (RSV, Flu A&B, Covid) Anterior Nasal Swab     Status: Abnormal   Collection Time: 10/15/23  3:22 PM   Specimen: Anterior Nasal Swab  Result Value Ref Range Status   SARS Coronavirus 2 by RT PCR NEGATIVE NEGATIVE Final    Comment: (NOTE) SARS-CoV-2 target nucleic acids are NOT DETECTED.  The SARS-CoV-2 RNA is generally detectable in upper respiratory specimens during the acute phase of infection. The lowest concentration of SARS-CoV-2 viral copies this assay can detect is 138 copies/mL. A negative result does not preclude  SARS-Cov-2 infection and should not be used as the sole basis for treatment or other patient management decisions. A negative result may occur with  improper specimen collection/handling, submission of specimen other than nasopharyngeal swab, presence of viral mutation(s) within the areas targeted by this assay, and inadequate number of viral copies(<138 copies/mL). A negative result must be combined with clinical observations, patient history, and epidemiological information. The expected result is Negative.  Fact Sheet for Patients:  BloggerCourse.com  Fact Sheet for Healthcare Providers:  SeriousBroker.it  This test is no t yet approved or cleared by the [place] FDA and  has been authorized for detection and/or diagnosis of SARS-CoV-2 by FDA under an Emergency Use Authorization (EUA). This EUA will remain  in effect (meaning this test can be used) for the duration of the COVID-19 declaration under Section 564(b)(1) of the Act, 21 U.S.C.section 360bbb-3(b)(1), unless the authorization is terminated  or revoked sooner.       Influenza A by PCR POSITIVE (A) NEGATIVE Final   Influenza B by PCR NEGATIVE NEGATIVE Final    Comment: (NOTE) The Xpert Xpress SARS-CoV-2/FLU/RSV plus assay is intended as an aid in the diagnosis of influenza from Nasopharyngeal swab specimens and should not be used as a sole basis for treatment. Nasal washings and aspirates are unacceptable for Xpert Xpress SARS-CoV-2/FLU/RSV testing.  Fact Sheet for Patients: BloggerCourse.com  Fact Sheet for Healthcare Providers: SeriousBroker.it  This test is not yet approved or cleared by the [place] FDA and has been authorized for detection and/or diagnosis of SARS-CoV-2 by FDA under an Emergency Use Authorization (EUA). This EUA will remain in effect (meaning this test can be used) for the duration of  the COVID-19 declaration under Section 564(b)(1) of the Act, 21 U.S.C. section 360bbb-3(b)(1), unless the authorization is terminated or revoked.     Resp Syncytial Virus by PCR NEGATIVE NEGATIVE Final  Comment: (NOTE) Fact Sheet for Patients: BloggerCourse.com  Fact Sheet for Healthcare Providers: SeriousBroker.it  This test is not yet approved or cleared by the [place] FDA and has been authorized for detection and/or diagnosis of SARS-CoV-2 by FDA under an Emergency Use Authorization (EUA). This EUA will remain in effect (meaning this test can be used) for the duration of the COVID-19 declaration under Section 564(b)(1) of the Act, 21 U.S.C. section 360bbb-3(b)(1), unless the authorization is terminated or revoked.  Performed at [facility], [address] [place], [place] [address]   Respiratory (~20 pathogens) panel by PCR     Status: Abnormal   Collection Time: 10/15/23  3:22 PM   Specimen: Nasopharyngeal Swab; Respiratory  Result Value Ref Range Status   Adenovirus NOT DETECTED NOT DETECTED Final   Coronavirus 229E NOT DETECTED NOT DETECTED Final    Comment: (NOTE) The Coronavirus on the Respiratory Panel, DOES NOT test for the novel  Coronavirus (2019 nCoV)    Coronavirus HKU1 NOT DETECTED NOT DETECTED Final   Coronavirus NL63 NOT DETECTED NOT DETECTED Final   Coronavirus OC43 NOT DETECTED NOT DETECTED Final   Metapneumovirus NOT DETECTED NOT DETECTED Final   Rhinovirus / Enterovirus NOT DETECTED NOT DETECTED Final   Influenza A H3 DETECTED (A) NOT DETECTED Final   Influenza B NOT DETECTED NOT DETECTED Final   Parainfluenza Virus 1 NOT DETECTED NOT DETECTED Final   Parainfluenza Virus 2 NOT DETECTED NOT DETECTED Final   Parainfluenza Virus 3 NOT DETECTED NOT DETECTED Final   Parainfluenza Virus 4 NOT DETECTED NOT DETECTED Final   Respiratory Syncytial Virus NOT DETECTED NOT DETECTED Final    Bordetella pertussis NOT DETECTED NOT DETECTED Final   Bordetella Parapertussis NOT DETECTED NOT DETECTED Final   Chlamydophila pneumoniae NOT DETECTED NOT DETECTED Final   Mycoplasma pneumoniae NOT DETECTED NOT DETECTED Final    Comment: Performed at [facility], [address] [place], [place] [address]  Blood Culture ID Panel (Reflexed)     Status: Abnormal   Collection Time: 10/15/23  3:22 PM  Result Value Ref Range Status   Enterococcus faecalis NOT DETECTED NOT DETECTED Final   Enterococcus Faecium NOT DETECTED NOT DETECTED Final   Listeria monocytogenes NOT DETECTED NOT DETECTED Final   Staphylococcus species DETECTED (A) NOT DETECTED Final    Comment: CRITICAL RESULT CALLED TO, READ BACK BY AND VERIFIED WITH: Princella Ion PHARMD 1324 10/17/23 HNM    Staphylococcus aureus (BCID) NOT DETECTED NOT DETECTED Final   Staphylococcus epidermidis NOT DETECTED NOT DETECTED Final   Staphylococcus lugdunensis NOT DETECTED NOT DETECTED Final   Streptococcus species NOT DETECTED NOT DETECTED Final   Streptococcus agalactiae NOT DETECTED NOT DETECTED Final   Streptococcus pneumoniae NOT DETECTED NOT DETECTED Final   Streptococcus pyogenes NOT DETECTED NOT DETECTED Final   A.calcoaceticus-baumannii NOT DETECTED NOT DETECTED Final   Bacteroides fragilis NOT DETECTED NOT DETECTED Final   Enterobacterales NOT DETECTED NOT DETECTED Final   Enterobacter cloacae complex NOT DETECTED NOT DETECTED Final   Escherichia coli NOT DETECTED NOT DETECTED Final   Klebsiella aerogenes NOT DETECTED NOT DETECTED Final   Klebsiella oxytoca NOT DETECTED NOT DETECTED Final   Klebsiella pneumoniae NOT DETECTED NOT DETECTED Final   Proteus species NOT DETECTED NOT DETECTED Final   Salmonella species NOT DETECTED NOT DETECTED Final   Serratia marcescens NOT DETECTED NOT DETECTED Final   Haemophilus influenzae NOT DETECTED NOT DETECTED Final   Neisseria meningitidis NOT DETECTED NOT DETECTED Final  Pseudomonas aeruginosa NOT DETECTED NOT DETECTED Final   Stenotrophomonas maltophilia NOT DETECTED NOT DETECTED Final   Candida albicans NOT DETECTED NOT DETECTED Final   Candida auris NOT DETECTED NOT DETECTED Final   Candida glabrata NOT DETECTED NOT DETECTED Final   Candida krusei NOT DETECTED NOT DETECTED Final   Candida parapsilosis NOT DETECTED NOT DETECTED Final   Candida tropicalis NOT DETECTED NOT DETECTED Final   Cryptococcus neoformans/gattii NOT DETECTED NOT DETECTED Final    Comment: Performed at [facility], [address] [place], [place] [address]  MRSA Next Gen by PCR, Nasal     Status: None   Collection Time: 10/15/23  6:00 PM   Specimen: Nasal Mucosa; Nasal Swab  Result Value Ref Range Status   MRSA by PCR Next Gen NOT DETECTED NOT DETECTED Final    Comment: (NOTE) The GeneXpert MRSA Assay (FDA approved for NASAL specimens only), is one component of a comprehensive MRSA colonization surveillance program. It is not intended to diagnose MRSA infection nor to guide or monitor treatment for MRSA infections. Test performance is not FDA approved in patients less than 70 years old. Performed at [facility], [address] [place], [place] [address]   Culture, blood (Routine X 2) w Reflex to ID Panel     Status: None   Collection Time: 10/17/23  7:29 AM   Specimen: BLOOD  Result Value Ref Range Status   Specimen Description BLOOD LEFT ANTECUBITAL  Final   Special Requests   Final    BOTTLES DRAWN AEROBIC AND ANAEROBIC Blood Culture results may not be optimal due to an inadequate volume of blood received in culture bottles   Culture   Final    NO GROWTH 5 DAYS Performed at [facility], [address] [place], [place] [address]    Report Status 10/22/2023 FINAL  Final  Culture, blood (Routine X 2) w Reflex to ID Panel     Status: Abnormal   Collection Time: 10/17/23  7:29 AM   Specimen: BLOOD  Result Value Ref Range Status    Specimen Description   Final    BLOOD BLOOD LEFT HAND Performed at [facility], [address], [place], [place] [address]    Special Requests   Final    BOTTLES DRAWN AEROBIC AND ANAEROBIC Blood Culture results may not be optimal due to an inadequate volume of blood received in culture bottles Performed at [facility], [address] [place], [place] [address]    Culture  Setup Time   Final    GRAM POSITIVE COCCI AEROBIC BOTTLE ONLY CRITICAL RESULT CALLED TO, READ BACK BY AND VERIFIED WITH: Lucius Conn HALAAJI 10/19/23 0705 MW GRAM STAIN REVIEWED-AGREE WITH RESULT DRT Performed at [facility], [address] [place], [place] [address]    Culture STAPHYLOCOCCUS HOMINIS (A)  Final   Report Status 10/23/2023 FINAL  Final   Organism ID, Bacteria STAPHYLOCOCCUS HOMINIS  Final      Susceptibility   Staphylococcus hominis - MIC*    CIPROFLOXACIN <=0.5 SENSITIVE Sensitive     ERYTHROMYCIN <=0.25 SENSITIVE Sensitive     GENTAMICIN <=0.5 SENSITIVE Sensitive     OXACILLIN <=0.25 SENSITIVE Sensitive     TETRACYCLINE >=16 RESISTANT Resistant     VANCOMYCIN 1 SENSITIVE Sensitive     TRIMETH/SULFA <=10 SENSITIVE Sensitive     CLINDAMYCIN <=0.25 SENSITIVE Sensitive     RIFAMPIN <=0.5 SENSITIVE Sensitive     Inducible Clindamycin NEGATIVE Sensitive     *  STAPHYLOCOCCUS HOMINIS     Total time spend on discharging this patient, including the last patient exam, discussing the hospital stay, instructions for ongoing care as it relates to all pertinent caregivers, as well as preparing the medical discharge records, prescriptions, and/or referrals as applicable, is 30 minutes.    Darlin Priestly, MD  [facility] 10/23/2023, 9:58 AM

## 2023-10-23 NOTE — Progress Notes (Signed)
 Mobility Specialist - Progress Note   10/23/23 1525  Mobility  Activity Refused mobility     Pt declined mobility; just returned to bed. Pt ambulated earlier this date with PT. Will attempt another date/time.    Filiberto Pinks Mobility Specialist 10/23/23, 3:26 PM

## 2023-10-23 NOTE — Progress Notes (Signed)
 PROGRESS NOTE    [name]  XLK:[id number] DOB: [birth year] DOA: 10/15/2023 PCP: Emogene Morgan, MD  229A/229A-AA  LOS: 7 days   Brief hospital course:   Assessment & Plan:  [name] is a 63 y.o. male with medical history significant of COPD, severe emphysema, adenocarcinoma of left lung, pulmonary embolism and depression came to ED with complaint of shortness of breath for 4 days.  EMS found him on 83% with 4 L, oxygen was increased to 6 L.    On arrival patient was tachycardic and significantly tachypneic with increased work of breathing.  Labs seems stable.  Respiratory panel positive for influenza A.  Blood cultures were drawn. Chest x-ray with severe emphysema.  No active disease EKG, personally reviewed, sinus tachycardia, QTc 477   Patient received Solu-Medrol and breathing treatments in ED and started on Tamiflu.   * Acute hypoxic respiratory failure (HCC) Ruled out chronic hypoxic respiratory failure EMS found him hypoxic at 83% and increased oxygen to 6 L, on arriving to ED he was placed on BiPAP due to significantly increased work of breathing.  Pt initially reported using 2L O2 at baseline, but group home owner reported pt does not use supplemental O2 at baseline. --weaned down to room air today   COPD exacerbation (HCC) Increased work of breathing and wheezing on presentation, likely secondary to influenza A infection.  Chest x-ray with no acute abnormalities.  Patient had underlying severe asthma and adenocarcinoma of left lung. --completed steroid burst --received hypertonic saline neb BID with DuoNeb --cont home bronchodilators   Influenza A RVP with influenza A H3 Completed Tamiflu   Pulmonary embolism (HCC) --cont home Eliquis   Adenocarcinoma of lung (HCC) --follows outpatient with Dr. Cathie Hoops.  Finished chemo radiation and has been on immunotherapy maintenance.  Last chemo was last year    Depression --cont Prozac, Remeron and Seroquel    Insomnia -Continue home trazodone as needed   Hyperglycemia 2/2  Steroid use   HTN, not currently active --BP low normal without BP med. --d/c home cardizem   1/4 blood cx pos for Staphylococcus hominis from 10/15/23 and 10/17/23 likely contaminant. No fever, no leukocytosis, likely contaminant.     DVT prophylaxis: DG:[id number] Code Status: Full code  Family Communication:  Level of care: Telemetry Medical Dispo:   The patient is from: group home Anticipated d/c is to: group home Anticipated d/c date is: tomorrow   Subjective and Interval History:  Patient Saturations on Room Air while Ambulating = 93%.  Pt ready to be discharged, however, group home could not take him back today.   Objective: Vitals:   10/22/23 1953 10/23/23 0200 10/23/23 0902 10/23/23 1514  BP: 128/77 102/67 111/67 92/62  Pulse: 100 81 80 90  Resp: 20 19 15 16   Temp: 97.7 F (36.5 C) 98.4 F (36.9 C) 98.5 F (36.9 C) (!) 97.2 F (36.2 C)  TempSrc: Oral  Oral Oral  SpO2: 96% 99% 95% 93%  Weight:      Height:        Intake/Output Summary (Last 24 hours) at 10/23/2023 1749 Last data filed at 10/23/2023 1516 Gross per 24 hour  Intake 1560 ml  Output 600 ml  Net 960 ml   Filed Weights   10/15/23 1513 10/17/23 2300  Weight: 105 kg 98.7 kg    Examination:   Constitutional: NAD CV: No cyanosis.   RESP: normal respiratory effort SKIN: warm, dry   Data Reviewed: I have personally reviewed labs  and imaging studies  Time spent: 35 minutes  Darlin Priestly, MD [facility] If 7PM-7AM, please contact night-coverage 10/23/2023, 5:49 PM

## 2023-10-23 NOTE — [place] FL2 (Addendum)
 [place] MEDICAID FL2 LEVEL OF CARE FORM     IDENTIFICATION  Patient Name: [name] Birthdate: 1961-04-04 Sex: male Admission Date (Current Location): 10/15/2023  [facility] and [place] Number:  Chiropodist and Address:         Provider Number: [id number]  Attending Physician Name and Address:  Darlin Priestly, MD  Relative Name and Phone Number:       Current Level of Care: Hospital Recommended Level of Care: Assisted Living  Prior Approval Number:    Date Approved/Denied:   PASRR Number:    Discharge Plan: Assisted Living    Current Diagnoses: Patient Active Problem List   Diagnosis Date Noted   Acute on chronic hypoxic respiratory failure (HCC) 10/15/2023   Influenza A 10/15/2023   Encounter for antineoplastic immunotherapy 09/01/2023   Lightheaded 08/25/2023   Pulmonary embolism (HCC) 07/13/2023   Normocytic anemia 07/13/2023   Dyslipidemia 06/20/2023   Depression 06/20/2023   COPD (chronic obstructive pulmonary disease) (HCC) 06/13/2023   Insomnia 05/25/2023   Adenocarcinoma of lung (HCC) 12/12/2021   Undifferentiated schizophrenia (HCC)    Hypokalemia 04/13/2020   Essential hypertension 04/13/2020   Coagulation disorder (HCC) 12/16/2019   COPD exacerbation (HCC) 01/16/2017   Ventral hernia 12/18/2014    Orientation RESPIRATION BLADDER Height & Weight     Self, Time, Situation, Place  Normal Continent Weight: 98.7 kg Height:  6\' 1"  (185.4 cm)  BEHAVIORAL SYMPTOMS/MOOD NEUROLOGICAL BOWEL NUTRITION STATUS      Continent Diet (Regular)  AMBULATORY STATUS COMMUNICATION OF NEEDS Skin   Independent Verbally Normal                       Personal Care Assistance Level of Assistance              Functional Limitations Info             SPECIAL CARE FACTORS FREQUENCY  PT (By licensed PT), OT (By licensed OT)     PT Frequency: [facility] OT Frequency: [facility]            Contractures Contractures Info: Not present     Additional Factors Info  Code Status, Allergies, Isolation Precautions Code Status Info: Full Allergies Info: NKDA     Isolation Precautions Info: Flu positive 3/9     TAKE these medications     albuterol 108 (90 Base) MCG/ACT inhaler Commonly known as: VENTOLIN HFA Inhale 1-2 puffs into the lungs every 6 (six) hours as needed for wheezing or shortness of breath.    apixaban 5 MG Tabs tablet Commonly known as: Eliquis Take 1 tablet (5 mg total) by mouth 2 (two) times daily. What changed: Another medication with the same name was removed. Continue taking this medication, and follow the directions you see here.    clopidogrel 75 MG tablet Commonly known as: PLAVIX Take 1 tablet (75 mg total) by mouth daily.    famotidine 20 MG tablet Commonly known as: PEPCID Take 1 tablet (20 mg total) by mouth daily.    FLUoxetine 20 MG capsule Commonly known as: PROZAC Take 1 capsule (20 mg total) by mouth daily.    guaiFENesin 600 MG 12 hr tablet Commonly known as: MUCINEX Take 1 tablet (600 mg total) by mouth 2 (two) times daily.    ipratropium-albuterol 0.5-2.5 (3) MG/3ML Soln Commonly known as: DUONEB Take 3 mLs by nebulization 3 (three) times daily.    mirtazapine 7.5 MG tablet Commonly known as: REMERON Take 1  tablet (7.5 mg total) by mouth at bedtime.    montelukast 10 MG tablet Commonly known as: SINGULAIR Take 1 tablet (10 mg total) by mouth at bedtime.    potassium chloride 10 MEQ tablet Commonly known as: KLOR-CON M Take 1 tablet (10 mEq total) by mouth daily.    pravastatin 20 MG tablet Commonly known as: PRAVACHOL Take 1 tablet (20 mg total) by mouth daily at 6 PM.    QUEtiapine 300 MG tablet Commonly known as: SEROQUEL Take 2 tablets (600 mg total) by mouth at bedtime.    senna-docusate 8.6-50 MG tablet Commonly known as: Senokot-S Take 1 tablet by mouth daily after breakfast.    traZODone 100 MG tablet Commonly known as: DESYREL Take 2 tablets  (200 mg total) by mouth at bedtime as needed for sleep.    Trelegy Ellipta 100-62.5-25 MCG/ACT Aepb Generic drug: Fluticasone-Umeclidin-Vilant Inhale 1 puff into the lungs daily.         Relevant Imaging Results:  Relevant Lab Results:   Additional Information SS#: [id number]  Chapman Fitch, RN

## 2023-10-23 NOTE — Progress Notes (Signed)
 Physical Therapy Treatment Patient Details Name: [name] MRN: [id number] DOB: 01-28-1961 Today's Date: 10/23/2023   History of Present Illness Pt is a 63 y.o. male came to ED with complaint of shortness of breath for 4 days. Admitted for management of acute on chronic hypoxic respiratory failure, COPD exacerbation and Flu A. PMH of COPD, severe emphysema, adenocarcinoma of left lung, pulmonary embolism and depression.    PT Comments  Patient is agreeable to PT session. Sp02 95-93% on room air at rest and 93% on room air with walking. Patient ambulated in the room without device with no loss of balance. Occasionally patient will hold furniture for support/comfort but he declined using rolling walker. Education provided on energy conservation strategies. PT will continue to follow to maximize independence.    If plan is discharge home, recommend the following: A little help with walking and/or transfers;A little help with bathing/dressing/bathroom;Help with stairs or ramp for entrance;Assist for transportation   Can travel by private vehicle        Equipment Recommendations  Rolling walker (2 wheels)    Recommendations for Other Services       Precautions / Restrictions Precautions Precautions: Fall Restrictions Weight Bearing Restrictions Per Provider Order: No     Mobility  Bed Mobility Overal bed mobility: Modified Independent                  Transfers Overall transfer level: Modified independent                      Ambulation/Gait Ambulation/Gait assistance: Supervision Gait Distance (Feet): 45 Feet Assistive device: None Gait Pattern/deviations: Step-through pattern Gait velocity: decreased     General Gait Details: patient declined using rolling walker for support. he is steady with walking without device but occasionally reaches for furniture for support/comfort. Sp02 93% on room air with ambulation. mild dyspnea with exertion with education on  energy conservation strategies   Stairs             Wheelchair Mobility     Tilt Bed    Modified Rankin (Stroke Patients Only)       Balance Overall balance assessment: Needs assistance Sitting-balance support: Feet supported Sitting balance-Leahy Scale: Good     Standing balance support: No upper extremity supported Standing balance-Leahy Scale: Fair                              Hotel manager: No apparent difficulties  Cognition Arousal: Alert Behavior During Therapy: WFL for tasks assessed/performed   PT - Cognitive impairments: No apparent impairments                         Following commands: Intact      Cueing Cueing Techniques: Verbal cues  Exercises      General Comments General comments (skin integrity, edema, etc.): RN present at start of session and removed oxygen. Sp02 93-95% on room air at rest and 93% on room air with walking      Pertinent Vitals/Pain Pain Assessment Pain Assessment: No/denies pain    Home Living                          Prior Function            PT Goals (current goals can now be found in the care plan section) Acute Rehab  PT Goals Patient Stated Goal: to go home PT Goal Formulation: With patient Time For Goal Achievement: 11/01/23 Potential to Achieve Goals: Good Progress towards PT goals: Progressing toward goals    Frequency    Min 2X/week      PT Plan      Co-evaluation              AM-PAC PT "6 Clicks" Mobility   Outcome Measure  Help needed turning from your back to your side while in a flat bed without using bedrails?: None Help needed moving from lying on your back to sitting on the side of a flat bed without using bedrails?: None Help needed moving to and from a bed to a chair (including a wheelchair)?: A Little Help needed standing up from a chair using your arms (e.g., wheelchair or bedside chair)?: A Little Help needed  to walk in hospital room?: A Little Help needed climbing 3-5 steps with a railing? : A Lot 6 Click Score: 19    End of Session   Activity Tolerance: Patient tolerated treatment well Patient left: in chair;with call bell/phone within reach Nurse Communication: Mobility status PT Visit Diagnosis: Unsteadiness on feet (R26.81);Muscle weakness (generalized) (M62.81);Difficulty in walking, not elsewhere classified (R26.2)     Time: 1116-1130 PT Time Calculation (min) (ACUTE ONLY): 14 min  Charges:    $Therapeutic Activity: 8-22 mins PT General Charges $$ ACUTE PT VISIT: 1 Visit                     Donna Bernard, PT, MPT    [name] 10/23/2023, 12:59 PM

## 2023-10-23 NOTE — Progress Notes (Signed)
 SATURATION QUALIFICATIONS:  Patient Saturations on Room Air at Rest = 93%  Patient Saturations on Room Air while Ambulating = 93%   Patient does not qualify for oxygen at this time.

## 2023-10-24 DIAGNOSIS — J9621 Acute and chronic respiratory failure with hypoxia: Secondary | ICD-10-CM | POA: Diagnosis not present

## 2023-10-24 MED ORDER — TUBERCULIN PPD 5 UNIT/0.1ML ID SOLN
5.0000 [IU] | Freq: Once | INTRADERMAL | Status: DC
  Administered 2023-10-24: 5 [IU] via INTRADERMAL
  Filled 2023-10-24 (×2): qty 0.1

## 2023-10-24 NOTE — TOC Initial Note (Signed)
 Transition of Care [facility]) - Initial/Assessment Note    Patient Details  Name: [name] MRN: [id number] Date of Birth: 1961-07-17  Transition of Care [facility]) CM/SW Contact:    Chapman Fitch, RN Phone Number: 10/24/2023, 11:51 AM  Clinical Narrative:                  Patient was to return to family care home today Spoke with owner nate who states that since patient is on O2 he will have go to to higher level of care.     Patient was weaned to RA.  However nate says that patient will still have to go to ALF level of care due to patient owning a O2 concentrator.   [name] confirms that patient can discharge tomorrow to [facility] and they will be able to provide transport.   Fl2 updated  Expected Discharge Plan: Group Home (with home health) Barriers to Discharge: Continued Medical Work up   Patient Goals and CMS Choice            Expected Discharge Plan and Services     Post Acute Care Choice: Resumption of Svcs/PTA Provider Living arrangements for the past 2 months: Group Home Expected Discharge Date: 10/24/23                         HH Arranged: RN, PT HH Agency: [facility] Date [facility] Agency Contacted: 10/16/23   Representative spoke with at [facility] Agency: Elnita Maxwell  Prior Living Arrangements/Services Living arrangements for the past 2 months: Group Home Lives with:: Facility Resident Patient language and need for interpreter reviewed:: Yes        Need for Family Participation in Patient Care: Yes (Comment) Care giver support system in place?: Yes (comment) Current home services: DME Criminal Activity/Legal Involvement Pertinent to Current Situation/Hospitalization: No - Comment as needed  Activities of Daily Living   ADL Screening (condition at time of admission) Independently performs ADLs?: Yes (appropriate for developmental age) Is the patient deaf or have difficulty hearing?: No Does the patient have difficulty seeing, even when  wearing glasses/contacts?: No Does the patient have difficulty concentrating, remembering, or making decisions?: No  Permission Sought/Granted                  Emotional Assessment         Alcohol / Substance Use: Not Applicable Psych Involvement: No (comment)  Admission diagnosis:  COPD exacerbation (HCC) [J44.1] Acute hypoxic respiratory failure (HCC) [J96.01] Acute on chronic hypoxic respiratory failure (HCC) [J96.21] Patient Active Problem List   Diagnosis Date Noted   Acute on chronic hypoxic respiratory failure (HCC) 10/15/2023   Influenza A 10/15/2023   Encounter for antineoplastic immunotherapy 09/01/2023   Lightheaded 08/25/2023   Pulmonary embolism (HCC) 07/13/2023   Normocytic anemia 07/13/2023   Dyslipidemia 06/20/2023   Depression 06/20/2023   COPD (chronic obstructive pulmonary disease) (HCC) 06/13/2023   Insomnia 05/25/2023   Adenocarcinoma of lung (HCC) 12/12/2021   Undifferentiated schizophrenia (HCC)    Hypokalemia 04/13/2020   Essential hypertension 04/13/2020   Coagulation disorder (HCC) 12/16/2019   COPD exacerbation (HCC) 01/16/2017   Ventral hernia 12/18/2014   PCP:  Emogene Morgan, MD Pharmacy:   [facility] - [place], [place] - [address] [place] [address] Phone: [phone] Fax: [phone]     Social Drivers of Health (SDOH) Social History: SDOH Screenings   Food Insecurity: No  Food Insecurity (10/17/2023)  Housing: Low Risk  (10/17/2023)  Transportation Needs: No Transportation Needs (10/17/2023)  Utilities: Not At Risk (10/17/2023)  Alcohol Screen: Low Risk  (06/16/2023)  Depression (PHQ2-9): Low Risk  (02/03/2023)  Financial Resource Strain: Medium Risk (08/18/2023)   [facility] from [facility]  Tobacco Use: Medium Risk (10/15/2023)   SDOH Interventions:     Readmission Risk Interventions    10/16/2023    1:34 PM 05/26/2023    2:38 PM  Readmission  Risk Prevention Plan  Transportation Screening  Complete  PCP or Specialist Appt within 3-5 Days  Complete  HRI or Home Care Consult  Complete  Social Work Consult for Recovery Care Planning/Counseling  Complete  Palliative Care Screening  Not Applicable  Medication Review Oceanographer) Complete Complete  PCP or Specialist appointment within 3-5 days of discharge Complete   HRI or Home Care Consult Complete   SW Recovery Care/Counseling Consult Complete   Palliative Care Screening Not Applicable   Skilled Nursing Facility Not Applicable

## 2023-10-24 NOTE — TOC Progression Note (Addendum)
 Transition of Care [facility]) - Progression Note    Patient Details  Name: [name] MRN: [id number] Date of Birth: 1961/06/26  Transition of Care [facility]) CM/SW Contact  Chapman Fitch, RN Phone Number: 10/24/2023, 10:39 AM  Clinical Narrative:      Vm left for nates at [facility] to coordinate discharge  Fl2 and dc summary placed in DC packet as previously requested by [name]   Update: 2nd message left for [name] to coordinate discharge.  TOC director zack brooks notified  Expected Discharge Plan: Group Home (with home health) Barriers to Discharge: Continued Medical Work up  Expected Discharge Plan and Services     Post Acute Care Choice: Resumption of Svcs/PTA Provider Living arrangements for the past 2 months: Group Home Expected Discharge Date: 10/24/23                         HH Arranged: RN, PT [facility] Agency: [facility] Date [facility]: [facility] Agency Contacted: 10/16/23   Representative spoke with at [facility] Agency: Elnita Maxwell   Social Determinants of Health (SDOH) Interventions SDOH Screenings   Food Insecurity: No Food Insecurity (10/17/2023)  Housing: Low Risk  (10/17/2023)  Transportation Needs: No Transportation Needs (10/17/2023)  Utilities: Not At Risk (10/17/2023)  Alcohol Screen: Low Risk  (06/16/2023)  Depression (PHQ2-9): Low Risk  (02/03/2023)  Financial Resource Strain: Medium Risk (08/18/2023)   Received from [facility]  Tobacco Use: Medium Risk (10/15/2023)    Readmission Risk Interventions    10/16/2023    1:34 PM 05/26/2023    2:38 PM  Readmission Risk Prevention Plan  Transportation Screening  Complete  PCP or Specialist Appt within 3-5 Days  Complete  HRI or Home Care Consult  Complete  Social Work Consult for Recovery Care Planning/Counseling  Complete  Palliative Care Screening  Not Applicable  Medication Review Oceanographer) Complete Complete  PCP or Specialist appointment within 3-5 days of discharge Complete   HRI  or Home Care Consult Complete   SW Recovery Care/Counseling Consult Complete   Palliative Care Screening Not Applicable   Skilled Nursing Facility Not Applicable

## 2023-10-24 NOTE — Progress Notes (Signed)
 PT Cancellation Note  Patient Details Name: [name] MRN: [id number] DOB: 1961-02-05   Cancelled Treatment:    Reason Eval/Treat Not Completed: Patient declined, no reason specified (He reports discharge anticipated for today. He declined any mobility or exercise.)  Donna Bernard, PT, MPT  [name] 10/24/2023, 1:39 PM

## 2023-10-24 NOTE — TOC Transition Note (Signed)
 Transition of Care [facility]) - Discharge Note   Patient Details  Name: [name] MRN: [id number] Date of Birth: 05-19-1961  [facility] Care [facility]) CM/SW Contact:  Chapman Fitch, RN Phone Number: 10/24/2023, 4:34 PM   Clinical Narrative:      Received call from Burna Mortimer at [facility] She request that PPD be placed prior to discharge  Patient to dc today.  Bedside RN to call Burna Mortimer after PPD is place, provide report, and arrange transport with Burna Mortimer.   DC summary and Fl2 printed and in DC packet Fl2 faxed to [facility] per [facility] request    Barriers to Discharge: Continued Medical Work up   Patient Goals and CMS Choice            Discharge Placement                       Discharge Plan and Services Additional resources added to the After Visit Summary for       Post Acute Care Choice: Resumption of Svcs/PTA Provider                    HH Arranged: RN, PT [facility] Agency: [facility] Date [facility] Agency Contacted: 10/16/23   Representative spoke with at [facility] Agency: Elnita Maxwell  Social Drivers of Health (SDOH) Interventions SDOH Screenings   Food Insecurity: No Food Insecurity (10/17/2023)  Housing: Low Risk  (10/17/2023)  Transportation Needs: No Transportation Needs (10/17/2023)  Utilities: Not At Risk (10/17/2023)  Alcohol Screen: Low Risk  (06/16/2023)  Depression (PHQ2-9): Low Risk  (02/03/2023)  Financial Resource Strain: Medium Risk (08/18/2023)   Received from [facility]  Tobacco Use: Medium Risk (10/15/2023)     Readmission Risk Interventions    10/16/2023    1:34 PM 05/26/2023    2:38 PM  Readmission Risk Prevention Plan  Transportation Screening  Complete  PCP or Specialist Appt within 3-5 Days  Complete  HRI or Home Care Consult  Complete  Social Work Consult for Recovery Care Planning/Counseling  Complete  Palliative Care Screening  Not Applicable  Medication Review Oceanographer) Complete Complete   PCP or Specialist appointment within 3-5 days of discharge Complete   HRI or Home Care Consult Complete   SW Recovery Care/Counseling Consult Complete   Palliative Care Screening Not Applicable   Skilled Nursing Facility Not Applicable

## 2023-10-24 NOTE — Plan of Care (Signed)

## 2023-10-26 ENCOUNTER — Ambulatory Visit
Admission: RE | Admit: 2023-10-26 | Discharge: 2023-10-26 | Disposition: A | Discharge status: Home / Self Care | Payer: 59 | Source: Ambulatory Visit | Attending: Oncology | Admitting: Oncology

## 2023-10-26 DIAGNOSIS — C3492 Malignant neoplasm of unspecified part of left bronchus or lung: Secondary | ICD-10-CM | POA: Diagnosis present

## 2023-10-26 MED ORDER — IOHEXOL 300 MG/ML  SOLN
100.0000 mL | Freq: Once | INTRAMUSCULAR | Status: AC | PRN
  Administered 2023-10-26: 100 mL via INTRAVENOUS

## 2023-11-02 ENCOUNTER — Inpatient Hospital Stay (HOSPITAL_BASED_OUTPATIENT_CLINIC_OR_DEPARTMENT_OTHER): Admitting: Oncology

## 2023-11-02 ENCOUNTER — Encounter: Payer: Self-pay | Admitting: Oncology

## 2023-11-02 ENCOUNTER — Inpatient Hospital Stay

## 2023-11-02 ENCOUNTER — Inpatient Hospital Stay: Attending: Oncology

## 2023-11-02 VITALS — BP 119/94 | HR 80 | Resp 18

## 2023-11-02 VITALS — BP 112/84 | HR 92 | Temp 97.6°F | Resp 17 | Wt 214.5 lb

## 2023-11-02 DIAGNOSIS — C3492 Malignant neoplasm of unspecified part of left bronchus or lung: Secondary | ICD-10-CM | POA: Diagnosis not present

## 2023-11-02 DIAGNOSIS — F17210 Nicotine dependence, cigarettes, uncomplicated: Secondary | ICD-10-CM | POA: Diagnosis not present

## 2023-11-02 DIAGNOSIS — E876 Hypokalemia: Secondary | ICD-10-CM | POA: Insufficient documentation

## 2023-11-02 DIAGNOSIS — I2699 Other pulmonary embolism without acute cor pulmonale: Secondary | ICD-10-CM | POA: Diagnosis not present

## 2023-11-02 DIAGNOSIS — I10 Essential (primary) hypertension: Secondary | ICD-10-CM | POA: Diagnosis not present

## 2023-11-02 DIAGNOSIS — Z87891 Personal history of nicotine dependence: Secondary | ICD-10-CM | POA: Diagnosis not present

## 2023-11-02 DIAGNOSIS — F209 Schizophrenia, unspecified: Secondary | ICD-10-CM | POA: Diagnosis not present

## 2023-11-02 DIAGNOSIS — J4489 Other specified chronic obstructive pulmonary disease: Secondary | ICD-10-CM | POA: Diagnosis not present

## 2023-11-02 DIAGNOSIS — I700 Atherosclerosis of aorta: Secondary | ICD-10-CM | POA: Diagnosis not present

## 2023-11-02 DIAGNOSIS — Z7902 Long term (current) use of antithrombotics/antiplatelets: Secondary | ICD-10-CM | POA: Insufficient documentation

## 2023-11-02 DIAGNOSIS — C3412 Malignant neoplasm of upper lobe, left bronchus or lung: Secondary | ICD-10-CM | POA: Insufficient documentation

## 2023-11-02 DIAGNOSIS — J449 Chronic obstructive pulmonary disease, unspecified: Secondary | ICD-10-CM | POA: Insufficient documentation

## 2023-11-02 DIAGNOSIS — D3501 Benign neoplasm of right adrenal gland: Secondary | ICD-10-CM | POA: Diagnosis not present

## 2023-11-02 DIAGNOSIS — K8020 Calculus of gallbladder without cholecystitis without obstruction: Secondary | ICD-10-CM | POA: Diagnosis not present

## 2023-11-02 DIAGNOSIS — C3490 Malignant neoplasm of unspecified part of unspecified bronchus or lung: Secondary | ICD-10-CM

## 2023-11-02 DIAGNOSIS — Z5112 Encounter for antineoplastic immunotherapy: Secondary | ICD-10-CM | POA: Diagnosis not present

## 2023-11-02 DIAGNOSIS — Z8673 Personal history of transient ischemic attack (TIA), and cerebral infarction without residual deficits: Secondary | ICD-10-CM | POA: Diagnosis not present

## 2023-11-02 DIAGNOSIS — J439 Emphysema, unspecified: Secondary | ICD-10-CM | POA: Diagnosis not present

## 2023-11-02 DIAGNOSIS — D3502 Benign neoplasm of left adrenal gland: Secondary | ICD-10-CM | POA: Insufficient documentation

## 2023-11-02 DIAGNOSIS — Z79899 Other long term (current) drug therapy: Secondary | ICD-10-CM | POA: Diagnosis not present

## 2023-11-02 DIAGNOSIS — J9621 Acute and chronic respiratory failure with hypoxia: Secondary | ICD-10-CM | POA: Insufficient documentation

## 2023-11-02 DIAGNOSIS — Z7982 Long term (current) use of aspirin: Secondary | ICD-10-CM | POA: Diagnosis not present

## 2023-11-02 LAB — CBC WITH DIFFERENTIAL (CANCER CENTER ONLY)
Abs Immature Granulocytes: 0 10*3/uL (ref 0.00–0.07)
Basophils Absolute: 0 10*3/uL (ref 0.0–0.1)
Basophils Relative: 0 %
Eosinophils Absolute: 0.1 10*3/uL (ref 0.0–0.5)
Eosinophils Relative: 2 %
HCT: 34.8 % — ABNORMAL LOW (ref 39.0–52.0)
Hemoglobin: 11.1 g/dL — ABNORMAL LOW (ref 13.0–17.0)
Immature Granulocytes: 0 %
Lymphocytes Relative: 27 %
Lymphs Abs: 1.1 10*3/uL (ref 0.7–4.0)
MCH: 28 pg (ref 26.0–34.0)
MCHC: 31.9 g/dL (ref 30.0–36.0)
MCV: 87.9 fL (ref 80.0–100.0)
Monocytes Absolute: 0.3 10*3/uL (ref 0.1–1.0)
Monocytes Relative: 8 %
Neutro Abs: 2.5 10*3/uL (ref 1.7–7.7)
Neutrophils Relative %: 63 %
Platelet Count: 233 10*3/uL (ref 150–400)
RBC: 3.96 MIL/uL — ABNORMAL LOW (ref 4.22–5.81)
RDW: 16.3 % — ABNORMAL HIGH (ref 11.5–15.5)
WBC Count: 4 10*3/uL (ref 4.0–10.5)
nRBC: 0 % (ref 0.0–0.2)

## 2023-11-02 LAB — CMP (CANCER CENTER ONLY)
ALT: 12 U/L (ref 0–44)
AST: 16 U/L (ref 15–41)
Albumin: 3.5 g/dL (ref 3.5–5.0)
Alkaline Phosphatase: 64 U/L (ref 38–126)
Anion gap: 12 (ref 5–15)
BUN: 5 mg/dL — ABNORMAL LOW (ref 8–23)
CO2: 22 mmol/L (ref 22–32)
Calcium: 9.2 mg/dL (ref 8.9–10.3)
Chloride: 104 mmol/L (ref 98–111)
Creatinine: 0.96 mg/dL (ref 0.61–1.24)
GFR, Estimated: >60 mL/min (ref >60)
Glucose, Bld: 106 mg/dL — ABNORMAL HIGH (ref 70–99)
Potassium: 3.7 mmol/L (ref 3.5–5.1)
Sodium: 138 mmol/L (ref 135–145)
Total Bilirubin: 0.5 mg/dL (ref 0.0–1.2)
Total Protein: 7.2 g/dL (ref 6.5–8.1)

## 2023-11-02 MED ORDER — SODIUM CHLORIDE 0.9 % IV SOLN
INTRAVENOUS | Status: DC
  Filled 2023-11-02 (×2): qty 250

## 2023-11-02 MED ORDER — SODIUM CHLORIDE 0.9% FLUSH
10.0000 mL | INTRAVENOUS | Status: DC | PRN
  Filled 2023-11-02: qty 10

## 2023-11-02 MED ORDER — SODIUM CHLORIDE 0.9 % IV SOLN
10.0000 mg/kg | Freq: Once | INTRAVENOUS | Status: AC
  Administered 2023-11-02: 1000 mg via INTRAVENOUS
  Filled 2023-11-02: qty 20

## 2023-11-02 NOTE — Progress Notes (Signed)
 Hematology/Oncology Progress note Telephone:([phone]) [phone] Fax:(336) [phone]      CHIEF COMPLAINTS/PURPOSE OF CONSULTATION:  Stage III Lung cancer  ASSESSMENT & PLAN:   Cancer Staging  Adenocarcinoma of lung [facility]) Staging form: Lung, AJCC 8th Edition - Clinical stage from 03/22/2023: cT1, cN3, cM0 - Signed by [name], MD on 03/22/2023   Adenocarcinoma of lung (HCC) Clinically he has Stage III left lung cancer He has right paratracheal lymph node hypermetabolic activity, although Station 7 lymph node showed atypical cell, no definite malignancy.  Left upper lobe adenocarcinoma, No enough tissue for NGS, will send off liquid biopsy- send off liquid biopsy.  S/p concurrent chemotherapy radiation. -carboplatin AUC 2 and taxol 45mg /m2   Clinically he is doing well today Labs are reviewed and discussed with patient. Proceed with Durvalumab maintenance.  CT scan was done and results were pending at the time of dictation.     COPD (chronic obstructive pulmonary disease) (HCC) Continue albuterol inhaler PRN, Duonebs.  follow up with pulmonology Dr. Meredeth Ide.  Continue Singulair, mucinex.   Hypokalemia Stable continue  potassium chloride daily,   Pulmonary embolism (HCC) Recommend Elqiuis 5mg  BID. Likely provoked  He is on Plavix 75mg  daily. Stop aspirin  Pending CT evaluation.      Orders Placed This Encounter  Procedures   CBC with Differential (Cancer Center Only)    Standing Status:   Future    Expected Date:   11/30/2023    Expiration Date:   11/29/2024   CMP (Cancer Center only)    Standing Status:   Future    Expected Date:   11/30/2023    Expiration Date:   11/29/2024   T4    Standing Status:   Future    Expected Date:   11/30/2023    Expiration Date:   11/29/2024   TSH    Standing Status:   Future    Expected Date:   11/30/2023    Expiration Date:   11/29/2024   Follow up  2 weeks Lab MD All questions were answered. The patient knows to call the clinic with  any problems, questions or concerns.  [name], MD, PhD [facility] Hematology Oncology 11/02/2023    HISTORY OF PRESENTING ILLNESS:  [name] 63 y.o. male presents to establish care for lung mass I have reviewed his chart and materials related to his cancer extensively and collaborated history with the patient. Summary of oncologic history is as follows:  12/08/2021 CT chest with contrast showed irregular solid pulmonary nodule of right upper lobe, increased in size.  Bibasilar consolidation.  Mildly enlarged right lower paratracheal lymph node.  Atherosclerosis. 01/04/2022 PET scan showed 10 mm irregular nodule in the lateral right upper lobe, suspicious for primary bronchogenic neoplasm. 2 small right paratracheal nodes, indeterminate and favored to be reactive.  Attention on follow-up.  Patient was seen by radiation oncology Dr. Rushie Chestnut.  Patient was offered SBRT to the right upper lobe nodule/presumed non-small cell lung cancer stage I.  Patient lives in a group home.  He reports chronic cough.  Some shortness of breath with exertion. He is a current everyday smoker.  Few cigarettes per day.  He denies any unintentional weight loss, night sweats, fever or chills. Patient is on aspirin and Plavix   INTERVAL HISTORY [name] is a 63 y.o. male who has above history reviewed by me today presents for follow up visit for lung cancer.  Oncology History  Adenocarcinoma of lung (HCC)  12/29/2022 Imaging  CT chest with contrast showed Stable radiation changes involving the right upper lobe with a small residual treated nodule.  Decrease in size.  Interval enlargement of the left upper lobe pulmonary nodule.  Stable mediastinal nodes stable advanced emphysematous changes and areas of pulmonary scarring.  Stable bilateral adrenal gland adenomas.  Emphysema   01/30/2023 Imaging   PET 1. Recurrent bronchogenic carcinoma in the left upper lobe with new and increasingly hypermetabolic  mediastinal lymph nodes. No evidence of distant metastatic disease. 2. Possible mild prostate hypermetabolism. Consider laboratory correlation. 3. Liver is mildly heterogeneous, raising suspicion for steatosis. 4. Cholelithiasis. 5. Right adrenal adenoma.  Left adrenal myelolipoma. 6. Aortic atherosclerosis (ICD10-I70.0). Coronary artery calcification. 7. Enlarged pulmonic trunk, indicative of pulmonary arterial hypertension.   03/15/2023 Initial Diagnosis   Adenocarcinoma of left lung   Bronchoscopy showed  Lung, biopsy, Left upper lobe - NON-SMALL CELL CARCINOMA, FAVOR ADENOCARCINOMA.  1. Bronchial Lavage, Left upper lobe - POSITIVE FOR MALIGNANCY. - NON-SMALL CELL CARCINOMA, FAVOR ADENOCARCINOMA. - SEE NOTE. 2. Bronchial Brushing, Left upper lobe - POSITIVE FOR MALIGNANCY. - NON-SMALL CELL CARCINOMA, FAVOR ADENOCARCINOMA. 3. Bronchus, biopsy, left upper lobe - SEE [id number] 4. Fine Needle Aspiration, left upper lobe - POSITIVE FOR MALIGNANCY. - NON-SMALL CELL CARCINOMA, FAVOR ADENOCARCINOMA. 5. Fine Needle Aspiration, station 7 lymph node - ATYPICAL. - BACKGROUND LYMPHOID TISSUE IS PRESENT CONSISTENT WITH LYMPH NODE SAMPLING.    03/15/2023 Procedure   Fiberoptic bronchoscopy with airway inspection and BAL Procedure findings:   Bronchoscope was inserted via ETT  without difficulty.  Posterior oropharynx, epiglottis, arytenoids, false cords and vocal cords were not visualized as these were bypassed by endotracheal tube. The distal trachea was normal in circumference and appearance without mucosal, cartilaginous or branching abnormalities.  The main carina was mildly splayed . All right and left lobar airways were visualized to the Subsegmental level.  Sub- sub segmental carinae were identified in all the distal airways.   Secretions were visible in the following airways and appeared to be clear.  The mucosa was : friable at LEFT UPPER LOBE   Airways were notable for:         exophytic lesions :n       extrinsic compression in the following distributions: n.       Friable mucosa: y       Teacher, music /pigmentation: n   MUCUS PLUGGING WAS SEVERE ON LEFT SIDE AND COMPLETE OBSTRUCTED MULTIPLE AIRWAYS.  UNABLE TO NAVIGATE THROUGH TRACHEOBRONCHIAL TREE DUE TO SEVERE MUCUS PLUGGING. THERAPEUTIC ASPIRATION WAS PERFORMED X 7 AND WAS DONE BILATERALLY ALTHOUGH WORSE ON LEFT.  BAL WAS PERFORMED AT LEFT UPPER LOBE X 2.     Endobronchial ultrasound assisted hilar and mediastinal lymph node biopsies procedure findings: The fiberoptic bronchoscope was removed and the EBUS scope was introduced. Examination began to evaluate for pathologically enlarged lymph nodes starting on the RIGHT  side progressing to the LEFT side.  All lymph node biopsies performed with 21G  needle. Lymph node biopsies were sent in cytolite for all stations.   STATION 10R - 6mm not biopsied STATION 7 - 1.2cm biopsied 3 times STATION 10L - 5mm not biopsied  STATION 4L - 6mm not biopsied    03/22/2023 Cancer Staging   Staging form: Lung, AJCC 8th Edition - Clinical stage from 03/22/2023: cT1, cN3, cM0 - Signed by [name], MD on 03/22/2023 Stage prefix: Initial diagnosis   04/20/2023 - 06/08/2023 Chemotherapy   Patient is on Treatment Plan : LUNG Carboplatin + Paclitaxel +  XRT q7d     09/01/2023 -  Chemotherapy   Patient is on Treatment Plan : LUNG Durvalumab (10) q14d       He reports feel well Chronic cough and SOB, he feels breathing status is at baseline today Recent admission due to COPD, Influenza. He is on Eliquis and Plavix.  No bleeding events, dark stool.    MEDICAL HISTORY:  Past Medical History:  Diagnosis Date   Acute on chronic respiratory failure with hypoxia (HCC)    Asthma    Coagulation disorder (HCC)    COPD (chronic obstructive pulmonary disease) (HCC)    Depression    History of hiatal hernia    Hypertension    Hypokalemia    Leukocytosis    Lung mass     Normocytic anemia 07/13/2023   Pneumonia    Schizophrenia (HCC)    Shortness of breath dyspnea    Stroke [facility])    Umbilical hernia    Ventral hernia     SURGICAL HISTORY: Past Surgical History:  Procedure Laterality Date   COLONOSCOPY WITH PROPOFOL N/A 09/21/2021   Procedure: COLONOSCOPY WITH PROPOFOL;  Surgeon: Regis Bill, MD;  Location: [facility] ENDOSCOPY;  Service: Endoscopy;  Laterality: N/A;   HERNIA REPAIR     INSERTION OF MESH N/A 12/18/2014   Procedure: INSERTION OF MESH;  Surgeon: Lattie Haw, MD;  Location: [facility] ORS;  Service: General;  Laterality: N/A;   SUPRA-UMBILICAL HERNIA  12/18/2014   Procedure: SUPRA-UMBILICAL HERNIA;  Surgeon: Lattie Haw, MD;  Location: [facility] ORS;  Service: General;;   UMBILICAL HERNIA REPAIR N/A 12/18/2014   Procedure: HERNIA REPAIR UMBILICAL ADULT;  Surgeon: Lattie Haw, MD;  Location: [facility] ORS;  Service: General;  Laterality: N/A;   VIDEO BRONCHOSCOPY WITH ENDOBRONCHIAL ULTRASOUND N/A 03/15/2023   Procedure: VIDEO BRONCHOSCOPY WITH ENDOBRONCHIAL ULTRASOUND;  Surgeon: Vida Rigger, MD;  Location: [facility] ORS;  Service: Thoracic;  Laterality: N/A;    SOCIAL HISTORY: Social History   Socioeconomic History   Marital status: Widowed    Spouse name: Not on file   Number of children: Not on file   Years of education: Not on file   Highest education level: Not on file  Occupational History   Not on file  Tobacco Use   Smoking status: Former    Current packs/day: 0.15    Average packs/day: 0.2 packs/day for 45.0 years (6.8 ttl pk-yrs)    Types: Cigarettes   Smokeless tobacco: Never  Vaping Use   Vaping status: Never Used  Substance and Sexual Activity   Alcohol use: Not Currently    Alcohol/week: 75.0 standard drinks of alcohol    Types: 75 Cans of beer per week   Drug use: Not Currently    Types: Marijuana, "Crack" cocaine    Comment: none in 15 yrs   Sexual activity: Not Currently  Other Topics Concern   Not on file   Social History Narrative   Not on file   Social Drivers of Health   Financial Resource Strain: Medium Risk (08/18/2023)   Received from [facility]   Overall Financial Resource Strain (CARDIA)    Difficulty of Paying Living Expenses: Somewhat hard  Food Insecurity: No Food Insecurity (10/17/2023)   Hunger Vital Sign    Worried About Running Out of Food in the Last Year: Never true    Ran Out of Food in the Last Year: Never true  Transportation Needs: No Transportation Needs (10/17/2023)   PRAPARE - Transportation  Lack of Transportation (Medical): No    Lack of Transportation (Non-Medical): No  Physical Activity: Not on file  Stress: Not on file  Social Connections: Not on file  Intimate Partner Violence: Not At Risk (10/17/2023)   Humiliation, Afraid, Rape, and Kick questionnaire    Fear of Current or Ex-Partner: No    Emotionally Abused: No    Physically Abused: No    Sexually Abused: No    FAMILY HISTORY: Family History  Problem Relation Age of Onset   Asthma Mother    Hypertension Father     ALLERGIES:  has no known allergies.  MEDICATIONS:  Current Outpatient Medications  Medication Sig Dispense Refill   albuterol (VENTOLIN HFA) 108 (90 Base) MCG/ACT inhaler Inhale 1-2 puffs into the lungs every 6 (six) hours as needed for wheezing or shortness of breath. 8 g 0   apixaban (ELIQUIS) 5 MG TABS tablet Take 1 tablet (5 mg total) by mouth 2 (two) times daily. 60 tablet 3   clopidogrel (PLAVIX) 75 MG tablet Take 1 tablet (75 mg total) by mouth daily. 30 tablet 0   famotidine (PEPCID) 20 MG tablet Take 1 tablet (20 mg total) by mouth daily. 30 tablet 0   FLUoxetine (PROZAC) 20 MG capsule Take 1 capsule (20 mg total) by mouth daily. 30 capsule 3   Fluticasone-Umeclidin-Vilant (TRELEGY ELLIPTA) 100-62.5-25 MCG/ACT AEPB Inhale 1 puff into the lungs daily. 28 each 1   guaiFENesin (MUCINEX) 600 MG 12 hr tablet Take 1 tablet (600 mg total) by mouth 2 (two)  times daily. 30 tablet 1   ipratropium-albuterol (DUONEB) 0.5-2.5 (3) MG/3ML SOLN Take 3 mLs by nebulization 3 (three) times daily. 360 mL 1   mirtazapine (REMERON) 7.5 MG tablet Take 1 tablet (7.5 mg total) by mouth at bedtime. 30 tablet 5   montelukast (SINGULAIR) 10 MG tablet Take 1 tablet (10 mg total) by mouth at bedtime. 30 tablet 1   potassium chloride (KLOR-CON M) 10 MEQ tablet Take 1 tablet (10 mEq total) by mouth daily. 30 tablet 1   pravastatin (PRAVACHOL) 20 MG tablet Take 1 tablet (20 mg total) by mouth daily at 6 PM. 30 tablet 0   QUEtiapine (SEROQUEL) 300 MG tablet Take 2 tablets (600 mg total) by mouth at bedtime. 60 tablet 0   senna-docusate (SENOKOT-S) 8.6-50 MG tablet Take 1 tablet by mouth daily after breakfast. 30 tablet 0   traZODone (DESYREL) 100 MG tablet Take 2 tablets (200 mg total) by mouth at bedtime as needed for sleep. 30 tablet 0   No current facility-administered medications for this visit.   Facility-Administered Medications Ordered in Other Visits  Medication Dose Route Frequency Provider Last Rate Last Admin   0.9 %  sodium chloride infusion   Intravenous Continuous [name], MD       durvalumab (IMFINZI) 1,000 mg in sodium chloride 0.9 % 100 mL chemo infusion  10 mg/kg (Treatment Plan Recorded) Intravenous Once [name], MD       sodium chloride flush (NS) 0.9 % injection 10 mL  10 mL Intracatheter PRN [name], MD        Review of Systems  Constitutional:  Negative for appetite change, chills, fatigue, fever and unexpected weight change.  HENT:   Negative for hearing loss and voice change.   Eyes:  Negative for eye problems and icterus.  Respiratory:  Positive for cough. Negative for chest tightness and hemoptysis.   Cardiovascular:  Negative for chest pain and leg swelling.  Gastrointestinal:  Negative for abdominal distention and abdominal pain.  Endocrine: Negative for hot flashes.  Genitourinary:  Negative for difficulty urinating, dysuria and  frequency.   Musculoskeletal:  Negative for arthralgias.  Skin:  Negative for itching and rash.  Neurological:  Negative for light-headedness and numbness.  Hematological:  Negative for adenopathy. Does not bruise/bleed easily.  Psychiatric/Behavioral:  Negative for confusion.      PHYSICAL EXAMINATION: ECOG PERFORMANCE STATUS: 0 - Asymptomatic  Vitals:   11/02/23 0841  BP: 112/84  Pulse: 92  Resp: 17  Temp: 97.6 F (36.4 C)  SpO2: 95%   Filed Weights   11/02/23 0841  Weight: 214 lb 8 oz (97.3 kg)    Physical Exam Constitutional:      General: He is not in acute distress.    Appearance: He is not diaphoretic.  HENT:     Head: Normocephalic and atraumatic.  Eyes:     General: No scleral icterus. Cardiovascular:     Rate and Rhythm: Normal rate and regular rhythm.  Pulmonary:     Effort: Pulmonary effort is normal. No respiratory distress.     Breath sounds: No wheezing.     Comments: Decreased breath sound bilaterally, poor air entry Abdominal:     General: There is no distension.     Palpations: Abdomen is soft.     Tenderness: There is no abdominal tenderness.  Musculoskeletal:        General: Normal range of motion.     Cervical back: Normal range of motion and neck supple.  Skin:    General: Skin is warm and dry.     Findings: No erythema.  Neurological:     Mental Status: He is alert and oriented to person, place, and time. Mental status is at baseline.     Motor: No abnormal muscle tone.  Psychiatric:        Mood and Affect: Affect normal.     Comments: Flat      LABORATORY DATA:  I have reviewed the data as listed    Latest Ref Rng & Units 11/02/2023    8:11 AM 10/23/2023    6:39 AM 10/18/2023    5:51 AM  CBC  WBC 4.0 - 10.5 K/uL 4.0  5.9  5.3   Hemoglobin 13.0 - 17.0 g/dL 40.9  9.8  81.1   Hematocrit 39.0 - 52.0 % 34.8  30.0  31.6   Platelets 150 - 400 K/uL 233  230  140       Latest Ref Rng & Units 11/02/2023    8:11 AM 10/23/2023    6:39  AM 10/18/2023    5:51 AM  CMP  Glucose 70 - 99 mg/dL 914  782  956   BUN 8 - 23 mg/dL 5  8  16    Creatinine 0.61 - 1.24 mg/dL 2.13  0.86  5.78   Sodium 135 - 145 mmol/L 138  138  140   Potassium 3.5 - 5.1 mmol/L 3.7  3.2  4.4   Chloride 98 - 111 mmol/L 104  101  103   CO2 22 - 32 mmol/L 22  31  27    Calcium 8.9 - 10.3 mg/dL 9.2  8.7  9.1   Total Protein 6.5 - 8.1 g/dL 7.2     Total Bilirubin 0.0 - 1.2 mg/dL 0.5     Alkaline Phos 38 - 126 U/L 64     AST 15 - 41 U/L 16     ALT 0 - 44  U/L 12        RADIOGRAPHIC STUDIES: I have personally reviewed the radiological images as listed and agreed with the findings in the report. DG Chest Port 1 View Result Date: 10/18/2023 CLINICAL DATA:  Respiratory distress EXAM: PORTABLE CHEST 1 VIEW COMPARISON:  10/15/2023, CT chest 07/12/2023 FINDINGS: Hyperinflation with emphysema. Focus of distortion in the left upper lobe corresponding to CT demonstrated spiculated nodule. Normal cardiomediastinal silhouette with aortic atherosclerosis. IMPRESSION: 1. Hyperinflation with emphysema. 2. Focus of distortion in the left upper lobe corresponding to CT demonstrated spiculated nodule, reference chest CT 07/12/2023 Electronically Signed   By: Jasmine Pang M.D.   On: 10/18/2023 23:35   DG Chest Port 1 View Result Date: 10/15/2023 CLINICAL DATA:  Worsening shortness of breath for 2 days.  COPD. EXAM: PORTABLE CHEST 1 VIEW COMPARISON:  06/13/2023 FINDINGS: The heart size and mediastinal contours are within normal limits. Severe emphysema again noted. No No evidence of acute infiltrate or edema. No pleural effusion. IMPRESSION: Severe emphysema. No active disease. Electronically Signed   By: Danae Orleans M.D.   On: 10/15/2023 15:59

## 2023-11-02 NOTE — Assessment & Plan Note (Addendum)
 Stable continue  potassium chloride daily,

## 2023-11-02 NOTE — Assessment & Plan Note (Signed)
 Recommend Elqiuis 5mg  BID. Likely provoked  He is on Plavix 75mg  daily. Stop aspirin  Pending CT evaluation.

## 2023-11-02 NOTE — Assessment & Plan Note (Signed)
 Continue albuterol inhaler PRN, Duonebs.  follow up with pulmonology Dr. Meredeth Ide.  Continue Singulair, mucinex.

## 2023-11-02 NOTE — Patient Instructions (Signed)
 [facility] - A DEPT OF [facility]  Discharge Instructions: Thank you for choosing [facility] to provide your oncology and hematology care.  If you have a lab appointment with the Cancer Center, please go directly to the Cancer Center and check in at the registration area.  Wear comfortable clothing and clothing appropriate for easy access to any Portacath or PICC line.   We strive to give you quality time with your provider. You may need to reschedule your appointment if you arrive late (15 or more minutes).  Arriving late affects you and other patients whose appointments are after yours.  Also, if you miss three or more appointments without notifying the office, you may be dismissed from the clinic at the provider's discretion.      For prescription refill requests, have your pharmacy contact our office and allow 72 hours for refills to be completed.    Today you received the following chemotherapy and/or immunotherapy agents Durvalumab      To help prevent nausea and vomiting after your treatment, we encourage you to take your nausea medication as directed.  BELOW ARE SYMPTOMS THAT SHOULD BE REPORTED IMMEDIATELY: *FEVER GREATER THAN 100.4 F (38 C) OR HIGHER *CHILLS OR SWEATING *NAUSEA AND VOMITING THAT IS NOT CONTROLLED WITH YOUR NAUSEA MEDICATION *UNUSUAL SHORTNESS OF BREATH *UNUSUAL BRUISING OR BLEEDING *URINARY PROBLEMS (pain or burning when urinating, or frequent urination) *BOWEL PROBLEMS (unusual diarrhea, constipation, pain near the anus) TENDERNESS IN MOUTH AND THROAT WITH OR WITHOUT PRESENCE OF ULCERS (sore throat, sores in mouth, or a toothache) UNUSUAL RASH, SWELLING OR PAIN  UNUSUAL VAGINAL DISCHARGE OR ITCHING   Items with * indicate a potential emergency and should be followed up as soon as possible or go to the Emergency Department if any problems should occur.  Please show the CHEMOTHERAPY ALERT CARD or IMMUNOTHERAPY  ALERT CARD at check-in to the Emergency Department and triage nurse.  Should you have questions after your visit or need to cancel or reschedule your appointment, please contact [facility] - A DEPT OF [facility]  [phone] and follow the prompts.  Office hours are 8:00 a.m. to 4:30 p.m. Monday - Friday. Please note that voicemails left after 4:00 p.m. may not be returned until the following business day.  We are closed weekends and major holidays. You have access to a nurse at all times for urgent questions. Please call the main number to the clinic [phone] and follow the prompts.  For any non-urgent questions, you may also contact your provider using MyChart. We now offer e-Visits for anyone 36 and older to request care online for non-urgent symptoms. For details visit [email or web address].   Also download the MyChart app! Go to the app store, search "MyChart", open the app, select [facility], and log in with your MyChart username and password.

## 2023-11-02 NOTE — Assessment & Plan Note (Signed)
 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  Left upper lobe adenocarcinoma, No enough tissue for NGS, will send off liquid biopsy- send off liquid biopsy.  S/p concurrent chemotherapy radiation. -carboplatin AUC 2 and taxol 45mg /m2   Clinically he is doing well today Labs are reviewed and discussed with patient. Proceed with Durvalumab maintenance.  CT scan was done and results were pending at the time of dictation.

## 2023-11-03 ENCOUNTER — Ambulatory Visit: Payer: 59

## 2023-11-03 ENCOUNTER — Other Ambulatory Visit: Payer: Self-pay

## 2023-11-03 ENCOUNTER — Other Ambulatory Visit: Payer: 59

## 2023-11-03 ENCOUNTER — Ambulatory Visit: Payer: 59 | Admitting: Oncology

## 2023-11-09 ENCOUNTER — Encounter: Payer: Self-pay | Admitting: Oncology

## 2023-11-16 ENCOUNTER — Inpatient Hospital Stay

## 2023-11-16 ENCOUNTER — Inpatient Hospital Stay: Attending: Oncology

## 2023-11-16 ENCOUNTER — Encounter: Payer: Self-pay | Admitting: Oncology

## 2023-11-16 ENCOUNTER — Ambulatory Visit: Payer: 59 | Admitting: Podiatry

## 2023-11-16 ENCOUNTER — Encounter: Payer: Self-pay | Admitting: Podiatry

## 2023-11-16 ENCOUNTER — Inpatient Hospital Stay (HOSPITAL_BASED_OUTPATIENT_CLINIC_OR_DEPARTMENT_OTHER): Admitting: Oncology

## 2023-11-16 VITALS — BP 120/76 | HR 95 | Temp 97.9°F | Resp 19 | Wt 215.6 lb

## 2023-11-16 VITALS — BP 119/78 | HR 84

## 2023-11-16 DIAGNOSIS — K8020 Calculus of gallbladder without cholecystitis without obstruction: Secondary | ICD-10-CM | POA: Insufficient documentation

## 2023-11-16 DIAGNOSIS — F209 Schizophrenia, unspecified: Secondary | ICD-10-CM | POA: Diagnosis not present

## 2023-11-16 DIAGNOSIS — Z87891 Personal history of nicotine dependence: Secondary | ICD-10-CM | POA: Diagnosis not present

## 2023-11-16 DIAGNOSIS — C3412 Malignant neoplasm of upper lobe, left bronchus or lung: Secondary | ICD-10-CM | POA: Insufficient documentation

## 2023-11-16 DIAGNOSIS — Z8673 Personal history of transient ischemic attack (TIA), and cerebral infarction without residual deficits: Secondary | ICD-10-CM | POA: Diagnosis not present

## 2023-11-16 DIAGNOSIS — M79674 Pain in right toe(s): Secondary | ICD-10-CM | POA: Diagnosis not present

## 2023-11-16 DIAGNOSIS — Z5112 Encounter for antineoplastic immunotherapy: Secondary | ICD-10-CM | POA: Diagnosis not present

## 2023-11-16 DIAGNOSIS — Z5111 Encounter for antineoplastic chemotherapy: Secondary | ICD-10-CM | POA: Insufficient documentation

## 2023-11-16 DIAGNOSIS — M79675 Pain in left toe(s): Secondary | ICD-10-CM

## 2023-11-16 DIAGNOSIS — I2510 Atherosclerotic heart disease of native coronary artery without angina pectoris: Secondary | ICD-10-CM | POA: Diagnosis not present

## 2023-11-16 DIAGNOSIS — C3492 Malignant neoplasm of unspecified part of left bronchus or lung: Secondary | ICD-10-CM

## 2023-11-16 DIAGNOSIS — D689 Coagulation defect, unspecified: Secondary | ICD-10-CM | POA: Diagnosis not present

## 2023-11-16 DIAGNOSIS — E876 Hypokalemia: Secondary | ICD-10-CM | POA: Insufficient documentation

## 2023-11-16 DIAGNOSIS — D3501 Benign neoplasm of right adrenal gland: Secondary | ICD-10-CM | POA: Diagnosis not present

## 2023-11-16 DIAGNOSIS — D3502 Benign neoplasm of left adrenal gland: Secondary | ICD-10-CM | POA: Insufficient documentation

## 2023-11-16 DIAGNOSIS — I2699 Other pulmonary embolism without acute cor pulmonale: Secondary | ICD-10-CM

## 2023-11-16 DIAGNOSIS — J439 Emphysema, unspecified: Secondary | ICD-10-CM | POA: Insufficient documentation

## 2023-11-16 DIAGNOSIS — I10 Essential (primary) hypertension: Secondary | ICD-10-CM | POA: Insufficient documentation

## 2023-11-16 DIAGNOSIS — J4489 Other specified chronic obstructive pulmonary disease: Secondary | ICD-10-CM | POA: Diagnosis not present

## 2023-11-16 DIAGNOSIS — Z79899 Other long term (current) drug therapy: Secondary | ICD-10-CM | POA: Diagnosis not present

## 2023-11-16 DIAGNOSIS — I700 Atherosclerosis of aorta: Secondary | ICD-10-CM | POA: Insufficient documentation

## 2023-11-16 DIAGNOSIS — J449 Chronic obstructive pulmonary disease, unspecified: Secondary | ICD-10-CM

## 2023-11-16 DIAGNOSIS — B351 Tinea unguium: Secondary | ICD-10-CM | POA: Diagnosis not present

## 2023-11-16 DIAGNOSIS — F17210 Nicotine dependence, cigarettes, uncomplicated: Secondary | ICD-10-CM | POA: Diagnosis not present

## 2023-11-16 LAB — CMP (CANCER CENTER ONLY)
ALT: 11 U/L (ref 0–44)
AST: 18 U/L (ref 15–41)
Albumin: 3.8 g/dL (ref 3.5–5.0)
Alkaline Phosphatase: 59 U/L (ref 38–126)
Anion gap: 7 (ref 5–15)
BUN: 8 mg/dL (ref 8–23)
CO2: 25 mmol/L (ref 22–32)
Calcium: 9.4 mg/dL (ref 8.9–10.3)
Chloride: 107 mmol/L (ref 98–111)
Creatinine: 0.72 mg/dL (ref 0.61–1.24)
GFR, Estimated: >60 mL/min (ref >60)
Glucose, Bld: 93 mg/dL (ref 70–99)
Potassium: 4.2 mmol/L (ref 3.5–5.1)
Sodium: 139 mmol/L (ref 135–145)
Total Bilirubin: 0.4 mg/dL (ref 0.0–1.2)
Total Protein: 7.4 g/dL (ref 6.5–8.1)

## 2023-11-16 LAB — CBC WITH DIFFERENTIAL (CANCER CENTER ONLY)
Abs Immature Granulocytes: 0.01 10*3/uL (ref 0.00–0.07)
Basophils Absolute: 0 10*3/uL (ref 0.0–0.1)
Basophils Relative: 1 %
Eosinophils Absolute: 0.1 10*3/uL (ref 0.0–0.5)
Eosinophils Relative: 2 %
HCT: 37 % — ABNORMAL LOW (ref 39.0–52.0)
Hemoglobin: 11.8 g/dL — ABNORMAL LOW (ref 13.0–17.0)
Immature Granulocytes: 0 %
Lymphocytes Relative: 26 %
Lymphs Abs: 1.3 10*3/uL (ref 0.7–4.0)
MCH: 28.6 pg (ref 26.0–34.0)
MCHC: 31.9 g/dL (ref 30.0–36.0)
MCV: 89.6 fL (ref 80.0–100.0)
Monocytes Absolute: 0.5 10*3/uL (ref 0.1–1.0)
Monocytes Relative: 9 %
Neutro Abs: 3.2 10*3/uL (ref 1.7–7.7)
Neutrophils Relative %: 62 %
Platelet Count: 204 10*3/uL (ref 150–400)
RBC: 4.13 MIL/uL — ABNORMAL LOW (ref 4.22–5.81)
RDW: 16 % — ABNORMAL HIGH (ref 11.5–15.5)
WBC Count: 5.1 10*3/uL (ref 4.0–10.5)
nRBC: 0 % (ref 0.0–0.2)

## 2023-11-16 MED ORDER — SODIUM CHLORIDE 0.9 % IV SOLN
10.0000 mg/kg | Freq: Once | INTRAVENOUS | Status: AC
  Administered 2023-11-16: 1000 mg via INTRAVENOUS
  Filled 2023-11-16: qty 20

## 2023-11-16 MED ORDER — APIXABAN 2.5 MG PO TABS: 2.5000 mg | ORAL_TABLET | Freq: Two times a day (BID) | ORAL | 3 refills | Status: DC

## 2023-11-16 MED ORDER — SODIUM CHLORIDE 0.9 % IV SOLN
INTRAVENOUS | Status: DC
  Filled 2023-11-16 (×2): qty 250

## 2023-11-16 NOTE — Assessment & Plan Note (Signed)
 Continue albuterol inhaler PRN, Duonebs.  follow up with pulmonology Dr. Meredeth Ide.  Continue Singulair, mucinex.

## 2023-11-16 NOTE — Assessment & Plan Note (Addendum)
 Recommend to decreased Elqiuis to 2.5mg  BID. He is on Plavix 75mg  daily.

## 2023-11-16 NOTE — Progress Notes (Signed)
  Subjective:  Patient ID: [name], male    DOB: Apr 04, 1961,  MRN: [id number]  63 y.o. male presents at risk foot care with h/o coagulation defect and painful, elongated thickened toenails x 10 which are symptomatic when wearing enclosed shoe gear. This interferes with his/her daily activities. Chief Complaint  Patient presents with   Nail Problem    "Cut toenails."   New problem(s): None   PCP is Aycock, Ngwe A, MD.  No Known Allergies  Review of Systems: Negative except as noted in the HPI.   Objective:  [name] is a pleasant 63 y.o. male in NAD. AAO x 3.  Vascular Examination: Vascular status intact b/l with palpable pedal pulses. CFT immediate b/l. Pedal hair present. No edema. No pain with calf compression b/l. Skin temperature gradient WNL b/l. No varicosities noted. No cyanosis or clubbing noted.  Neurological Examination: Sensation grossly intact b/l with 10 gram monofilament. Vibratory sensation intact b/l.  Dermatological Examination: Pedal skin with normal turgor, texture and tone b/l. No open wounds nor interdigital macerations noted. Toenails 1-5 b/l thick, discolored, elongated with subungual debris and pain on dorsal palpation. No hyperkeratotic lesions noted b/l.   Musculoskeletal Examination: Muscle strength 5/5 to b/l LE.  No pain, crepitus noted b/l. No gross pedal deformities. Patient ambulates independently without assistive aids.   Radiographs: None  Last A1c:      Latest Ref Rng & Units 10/15/2023    3:22 PM 06/17/2023    1:32 PM 03/26/2023    7:09 AM  Hemoglobin A1C  Hemoglobin-A1c 4.8 - 5.6 % 5.6  5.6  5.8      Assessment:   1. Pain due to onychomycosis of toenails of both feet   2. Coagulation disorder (HCC)    Plan:  -Caregiver/provider present with patient on today's visit. -Continue supportive shoe gear daily. -Toenails 1-5 b/l were debrided in length and girth with sterile nail nippers and dremel without iatrogenic bleeding.   -Patient/POA to call should there be question/concern in the interim.  Return in about 3 months (around 02/15/2024).  Luella Sager, DPM      [facility] LOCATION: [address] [place] [address]                   Office [phone]   [facility] LOCATION: [address] [place], [place] [address] Office [phone]

## 2023-11-16 NOTE — Progress Notes (Signed)
 Hematology/Oncology Progress note [phone] [phone]      CHIEF COMPLAINTS/PURPOSE OF CONSULTATION:  Stage III Lung cancer  ASSESSMENT & PLAN:   Cancer Staging  Adenocarcinoma of lung [facility]) Staging form: Lung, AJCC 8th Edition - Clinical stage from 03/22/2023: cT1, cN3, cM0 - Signed by [name], MD on 03/22/2023   Adenocarcinoma of lung (HCC) Clinically he has Stage III left lung cancer He has right paratracheal lymph node hypermetabolic activity, although Station 7 lymph node showed atypical cell, no definite malignancy.  Left upper lobe adenocarcinoma, No enough tissue for NGS, will send off liquid biopsy- send off liquid biopsy.  S/p concurrent chemotherapy radiation. -carboplatin AUC 2 and taxol 45mg /m2   Clinically he is doing well today Labs are reviewed and discussed with patient. Proceed with Durvalumab maintenance.  CT scan showed no recurrence.     Pulmonary embolism (HCC) Recommend to decreased Elqiuis to 2.5mg  BID. He is on Plavix 75mg  daily.    COPD (chronic obstructive pulmonary disease) (HCC) Continue albuterol inhaler PRN, Duonebs.  follow up with pulmonology Dr. Meredeth Ide.  Continue Singulair, mucinex.   Encounter for antineoplastic immunotherapy Immunotherapy plan as planned.   Hypokalemia Stable continue  potassium chloride daily,    Orders Placed This Encounter  Procedures   CBC with Differential (Cancer Center Only)    Standing Status:   Future    Expected Date:   12/14/2023    Expiration Date:   12/13/2024   CMP (Cancer Center only)    Standing Status:   Future    Expected Date:   12/14/2023    Expiration Date:   12/13/2024   T4    Standing Status:   Future    Expected Date:   12/14/2023    Expiration Date:   12/13/2024   TSH    Standing Status:   Future    Expected Date:   12/14/2023    Expiration Date:   12/13/2024   Follow up  2 weeks Lab MD All questions were answered. The patient knows to call the clinic with any  problems, questions or concerns.  [name], MD, PhD [facility] Hematology Oncology 11/16/2023    HISTORY OF PRESENTING ILLNESS:  [name] 63 y.o. male presents to establish care for lung mass I have reviewed his chart and materials related to his cancer extensively and collaborated history with the patient. Summary of oncologic history is as follows:  12/08/2021 CT chest with contrast showed irregular solid pulmonary nodule of right upper lobe, increased in size.  Bibasilar consolidation.  Mildly enlarged right lower paratracheal lymph node.  Atherosclerosis. 01/04/2022 PET scan showed 10 mm irregular nodule in the lateral right upper lobe, suspicious for primary bronchogenic neoplasm. 2 small right paratracheal nodes, indeterminate and favored to be reactive.  Attention on follow-up.  Patient was seen by radiation oncology Dr. Rushie Chestnut.  Patient was offered SBRT to the right upper lobe nodule/presumed non-small cell lung cancer stage I.  Patient lives in a group home.  He reports chronic cough.  Some shortness of breath with exertion. He is a current everyday smoker.  Few cigarettes per day.  He denies any unintentional weight loss, night sweats, fever or chills. Patient is on aspirin and Plavix   INTERVAL HISTORY [name] is a 63 y.o. male who has above history reviewed by me today presents for follow up visit for lung cancer.  Oncology History  Adenocarcinoma of lung (HCC)  12/29/2022 Imaging   CT chest with  contrast showed Stable radiation changes involving the right upper lobe with a small residual treated nodule.  Decrease in size.  Interval enlargement of the left upper lobe pulmonary nodule.  Stable mediastinal nodes stable advanced emphysematous changes and areas of pulmonary scarring.  Stable bilateral adrenal gland adenomas.  Emphysema   01/30/2023 Imaging   PET 1. Recurrent bronchogenic carcinoma in the left upper lobe with new and increasingly hypermetabolic  mediastinal lymph nodes. No evidence of distant metastatic disease. 2. Possible mild prostate hypermetabolism. Consider laboratory correlation. 3. Liver is mildly heterogeneous, raising suspicion for steatosis. 4. Cholelithiasis. 5. Right adrenal adenoma.  Left adrenal myelolipoma. 6. Aortic atherosclerosis (ICD10-I70.0). Coronary artery calcification. 7. Enlarged pulmonic trunk, indicative of pulmonary arterial hypertension.   03/15/2023 Initial Diagnosis   Adenocarcinoma of left lung   Bronchoscopy showed  Lung, biopsy, Left upper lobe - NON-SMALL CELL CARCINOMA, FAVOR ADENOCARCINOMA.  1. Bronchial Lavage, Left upper lobe - POSITIVE FOR MALIGNANCY. - NON-SMALL CELL CARCINOMA, FAVOR ADENOCARCINOMA. - SEE NOTE. 2. Bronchial Brushing, Left upper lobe - POSITIVE FOR MALIGNANCY. - NON-SMALL CELL CARCINOMA, FAVOR ADENOCARCINOMA. 3. Bronchus, biopsy, left upper lobe - SEE [id number] 4. Fine Needle Aspiration, left upper lobe - POSITIVE FOR MALIGNANCY. - NON-SMALL CELL CARCINOMA, FAVOR ADENOCARCINOMA. 5. Fine Needle Aspiration, station 7 lymph node - ATYPICAL. - BACKGROUND LYMPHOID TISSUE IS PRESENT CONSISTENT WITH LYMPH NODE SAMPLING.    03/15/2023 Procedure   Fiberoptic bronchoscopy with airway inspection and BAL Procedure findings:   Bronchoscope was inserted via ETT  without difficulty.  Posterior oropharynx, epiglottis, arytenoids, false cords and vocal cords were not visualized as these were bypassed by endotracheal tube. The distal trachea was normal in circumference and appearance without mucosal, cartilaginous or branching abnormalities.  The main carina was mildly splayed . All right and left lobar airways were visualized to the Subsegmental level.  Sub- sub segmental carinae were identified in all the distal airways.   Secretions were visible in the following airways and appeared to be clear.  The mucosa was : friable at LEFT UPPER LOBE   Airways were notable for:         exophytic lesions :n       extrinsic compression in the following distributions: n.       Friable mucosa: y       Teacher, music /pigmentation: n   MUCUS PLUGGING WAS SEVERE ON LEFT SIDE AND COMPLETE OBSTRUCTED MULTIPLE AIRWAYS.  UNABLE TO NAVIGATE THROUGH TRACHEOBRONCHIAL TREE DUE TO SEVERE MUCUS PLUGGING. THERAPEUTIC ASPIRATION WAS PERFORMED X 7 AND WAS DONE BILATERALLY ALTHOUGH WORSE ON LEFT.  BAL WAS PERFORMED AT LEFT UPPER LOBE X 2.     Endobronchial ultrasound assisted hilar and mediastinal lymph node biopsies procedure findings: The fiberoptic bronchoscope was removed and the EBUS scope was introduced. Examination began to evaluate for pathologically enlarged lymph nodes starting on the RIGHT  side progressing to the LEFT side.  All lymph node biopsies performed with 21G  needle. Lymph node biopsies were sent in cytolite for all stations.   STATION 10R - 6mm not biopsied STATION 7 - 1.2cm biopsied 3 times STATION 10L - 5mm not biopsied  STATION 4L - 6mm not biopsied    03/22/2023 Cancer Staging   Staging form: Lung, AJCC 8th Edition - Clinical stage from 03/22/2023: cT1, cN3, cM0 - Signed by [name], MD on 03/22/2023 Stage prefix: Initial diagnosis   04/20/2023 - 06/08/2023 Chemotherapy   Patient is on Treatment Plan : LUNG Carboplatin + Paclitaxel + XRT q7d  09/01/2023 -  Chemotherapy   Patient is on Treatment Plan : LUNG Durvalumab (10) q14d     10/15/2023 - 10/24/2023 Hospital Admission   Hospitalized due to Influenza A, COPD exacerbation, acute respiratory failure.      He reports feel well Chronic cough and SOB, he feels breathing status is at baseline today he is on Eliquis and Plavix.  No bleeding events, dark stool.    MEDICAL HISTORY:  Past Medical History:  Diagnosis Date   Acute on chronic respiratory failure with hypoxia (HCC)    Asthma    Coagulation disorder (HCC)    COPD (chronic obstructive pulmonary disease) (HCC)    Depression    History of  hiatal hernia    Hypertension    Hypokalemia    Leukocytosis    Lung mass    Normocytic anemia 07/13/2023   Pneumonia    Schizophrenia (HCC)    Shortness of breath dyspnea    Stroke [facility])    Umbilical hernia    Ventral hernia     SURGICAL HISTORY: Past Surgical History:  Procedure Laterality Date   COLONOSCOPY WITH PROPOFOL N/A 09/21/2021   Procedure: COLONOSCOPY WITH PROPOFOL;  Surgeon: Regis Bill, MD;  Location: [facility] ENDOSCOPY;  Service: Endoscopy;  Laterality: N/A;   HERNIA REPAIR     INSERTION OF MESH N/A 12/18/2014   Procedure: INSERTION OF MESH;  Surgeon: Lattie Haw, MD;  Location: [facility] ORS;  Service: General;  Laterality: N/A;   SUPRA-UMBILICAL HERNIA  12/18/2014   Procedure: SUPRA-UMBILICAL HERNIA;  Surgeon: Lattie Haw, MD;  Location: [facility] ORS;  Service: General;;   UMBILICAL HERNIA REPAIR N/A 12/18/2014   Procedure: HERNIA REPAIR UMBILICAL ADULT;  Surgeon: Lattie Haw, MD;  Location: [facility] ORS;  Service: General;  Laterality: N/A;   VIDEO BRONCHOSCOPY WITH ENDOBRONCHIAL ULTRASOUND N/A 03/15/2023   Procedure: VIDEO BRONCHOSCOPY WITH ENDOBRONCHIAL ULTRASOUND;  Surgeon: Vida Rigger, MD;  Location: [facility] ORS;  Service: Thoracic;  Laterality: N/A;    SOCIAL HISTORY: Social History   Socioeconomic History   Marital status: Widowed    Spouse name: Not on file   Number of children: Not on file   Years of education: Not on file   Highest education level: Not on file  Occupational History   Not on file  Tobacco Use   Smoking status: Former    Current packs/day: 0.15    Average packs/day: 0.2 packs/day for 45.0 years (6.8 ttl pk-yrs)    Types: Cigarettes   Smokeless tobacco: Never  Vaping Use   Vaping status: Never Used  Substance and Sexual Activity   Alcohol use: Not Currently    Alcohol/week: 75.0 standard drinks of alcohol    Types: 75 Cans of beer per week   Drug use: Not Currently    Types: Marijuana, "Crack" cocaine    Comment: none in  15 yrs   Sexual activity: Not Currently  Other Topics Concern   Not on file  Social History Narrative   Not on file   Social Drivers of Health   Financial Resource Strain: Medium Risk (08/18/2023)   Received [facility]   Overall Financial Resource Strain (CARDIA)    Difficulty of Paying Living Expenses: Somewhat hard  Food Insecurity: No Food Insecurity (10/17/2023)   Hunger Vital Sign    Worried About Running Out of Food in the Last Year: Never true    Ran Out of Food in the Last Year: Never true  Transportation Needs: No  Transportation Needs (10/17/2023)   PRAPARE - Administrator, Civil Service (Medical): No    Lack of Transportation (Non-Medical): No  Physical Activity: Not on file  Stress: Not on file  Social Connections: Not on file  Intimate Partner Violence: Not At Risk (10/17/2023)   Humiliation, Afraid, Rape, and Kick questionnaire    Fear of Current or Ex-Partner: No    Emotionally Abused: No    Physically Abused: No    Sexually Abused: No    FAMILY HISTORY: Family History  Problem Relation Age of Onset   Asthma Mother    Hypertension Father     ALLERGIES:  has no known allergies.  MEDICATIONS:  Current Outpatient Medications  Medication Sig Dispense Refill   albuterol (VENTOLIN HFA) 108 (90 Base) MCG/ACT inhaler Inhale 1-2 puffs into the lungs every 6 (six) hours as needed for wheezing or shortness of breath. 8 g 0   apixaban (ELIQUIS) 2.5 MG TABS tablet Take 1 tablet (2.5 mg total) by mouth 2 (two) times daily. 60 tablet 3   clopidogrel (PLAVIX) 75 MG tablet Take 1 tablet (75 mg total) by mouth daily. 30 tablet 0   famotidine (PEPCID) 20 MG tablet Take 1 tablet (20 mg total) by mouth daily. 30 tablet 0   FLUoxetine (PROZAC) 20 MG capsule Take 1 capsule (20 mg total) by mouth daily. 30 capsule 3   Fluticasone-Umeclidin-Vilant (TRELEGY ELLIPTA) 100-62.5-25 MCG/ACT AEPB Inhale 1 puff into the lungs daily. 28 each 1    guaiFENesin (MUCINEX) 600 MG 12 hr tablet Take 1 tablet (600 mg total) by mouth 2 (two) times daily. 30 tablet 1   ipratropium-albuterol (DUONEB) 0.5-2.5 (3) MG/3ML SOLN Take 3 mLs by nebulization 3 (three) times daily. 360 mL 1   mirtazapine (REMERON) 7.5 MG tablet Take 1 tablet (7.5 mg total) by mouth at bedtime. 30 tablet 5   montelukast (SINGULAIR) 10 MG tablet Take 1 tablet (10 mg total) by mouth at bedtime. 30 tablet 1   potassium chloride (KLOR-CON M) 10 MEQ tablet Take 1 tablet (10 mEq total) by mouth daily. 30 tablet 1   pravastatin (PRAVACHOL) 20 MG tablet Take 1 tablet (20 mg total) by mouth daily at 6 PM. 30 tablet 0   QUEtiapine (SEROQUEL) 300 MG tablet Take 2 tablets (600 mg total) by mouth at bedtime. 60 tablet 0   senna-docusate (SENOKOT-S) 8.6-50 MG tablet Take 1 tablet by mouth daily after breakfast. 30 tablet 0   traZODone (DESYREL) 100 MG tablet Take 2 tablets (200 mg total) by mouth at bedtime as needed for sleep. 30 tablet 0   No current facility-administered medications for this visit.   Facility-Administered Medications Ordered in Other Visits  Medication Dose Route Frequency Provider Last Rate Last Admin   0.9 %  sodium chloride infusion   Intravenous Continuous [name], MD   Stopped at 11/16/23 1624    Review of Systems  Constitutional:  Negative for appetite change, chills, fatigue, fever and unexpected weight change.  HENT:   Negative for hearing loss and voice change.   Eyes:  Negative for eye problems and icterus.  Respiratory:  Positive for cough. Negative for chest tightness and hemoptysis.   Cardiovascular:  Negative for chest pain and leg swelling.  Gastrointestinal:  Negative for abdominal distention and abdominal pain.  Endocrine: Negative for hot flashes.  Genitourinary:  Negative for difficulty urinating, dysuria and frequency.   Musculoskeletal:  Negative for arthralgias.  Skin:  Negative for itching and rash.  Neurological:  Negative for  light-headedness and numbness.  Hematological:  Negative for adenopathy. Does not bruise/bleed easily.  Psychiatric/Behavioral:  Negative for confusion.      PHYSICAL EXAMINATION: ECOG PERFORMANCE STATUS: 0 - Asymptomatic  Vitals:   11/16/23 1331  BP: 120/76  Pulse: 95  Resp: 19  Temp: 97.9 F (36.6 C)  SpO2: 98%   Filed Weights   11/16/23 1331  Weight: 215 lb 9.6 oz (97.8 kg)    Physical Exam Constitutional:      General: He is not in acute distress.    Appearance: He is not diaphoretic.  HENT:     Head: Normocephalic and atraumatic.  Eyes:     General: No scleral icterus. Cardiovascular:     Rate and Rhythm: Normal rate and regular rhythm.  Pulmonary:     Effort: Pulmonary effort is normal. No respiratory distress.     Breath sounds: No wheezing.     Comments: Decreased breath sound bilaterally, poor air entry Abdominal:     General: There is no distension.     Palpations: Abdomen is soft.     Tenderness: There is no abdominal tenderness.  Musculoskeletal:        General: Normal range of motion.     Cervical back: Normal range of motion and neck supple.  Skin:    General: Skin is warm and dry.     Findings: No erythema.  Neurological:     Mental Status: He is alert and oriented to person, place, and time. Mental status is at baseline.     Motor: No abnormal muscle tone.  Psychiatric:        Mood and Affect: Affect normal.     Comments: Flat      LABORATORY DATA:  I have reviewed the data as listed    Latest Ref Rng & Units 11/16/2023   12:44 PM 11/02/2023    8:11 AM 10/23/2023    6:39 AM  CBC  WBC 4.0 - 10.5 K/uL 5.1  4.0  5.9   Hemoglobin 13.0 - 17.0 g/dL 16.1  09.6  9.8   Hematocrit 39.0 - 52.0 % 37.0  34.8  30.0   Platelets 150 - 400 K/uL 204  233  230       Latest Ref Rng & Units 11/16/2023   12:44 PM 11/02/2023    8:11 AM 10/23/2023    6:39 AM  CMP  Glucose 70 - 99 mg/dL 93  045  409   BUN 8 - 23 mg/dL 8  5  8    Creatinine 0.61 - 1.24  mg/dL 8.11  9.14  7.82   Sodium 135 - 145 mmol/L 139  138  138   Potassium 3.5 - 5.1 mmol/L 4.2  3.7  3.2   Chloride 98 - 111 mmol/L 107  104  101   CO2 22 - 32 mmol/L 25  22  31    Calcium 8.9 - 10.3 mg/dL 9.4  9.2  8.7   Total Protein 6.5 - 8.1 g/dL 7.4  7.2    Total Bilirubin 0.0 - 1.2 mg/dL 0.4  0.5    Alkaline Phos 38 - 126 U/L 59  64    AST 15 - 41 U/L 18  16    ALT 0 - 44 U/L 11  12       RADIOGRAPHIC STUDIES: I have personally reviewed the radiological images as listed and agreed with the findings in the report. CT CHEST ABDOMEN PELVIS W CONTRAST Result Date:  11/03/2023 CLINICAL DATA:  Lung cancer follow-up. Chronic cough. * Tracking Code: BO * EXAM: CT CHEST, ABDOMEN, AND PELVIS WITH CONTRAST TECHNIQUE: Multidetector CT imaging of the chest, abdomen and pelvis was performed following the standard protocol during bolus administration of intravenous contrast. RADIATION DOSE REDUCTION: This exam was performed according to the departmental dose-optimization program which includes automated exposure control, adjustment of the mA and/or kV according to patient size and/or use of iterative reconstruction technique. CONTRAST:  OMNIPAQUE IOHEXOL 300 MG/ML  SOLN COMPARISON:  July 12, 2023 FINDINGS: CT CHEST FINDINGS Cardiovascular: No significant vascular findings. Normal heart size. No pericardial effusion. Mediastinum/Nodes: RIGHT paratracheal node measures 9 mm unchanged from prior. No supraclavicular adenopathy. No axillary adenopathy. Lungs/Pleura: Linear scarring atelectasis in the RIGHT middle lobe is slightly improved. Persistent atelectasis in the RIGHT lower lobe. LEFT upper lobe peribronchial lesion measures 20 mm by 11 mm (51/4) compares to 20 mm by 12 mm remeasured. Lesion measures 18 mm in craniocaudad dimension (image 56/5) compared to 20 mm on prior. Visually lesion appears unchanged. There is interstitial linear thickening radiating from this nodule suggesting prior radiation  treatment. No new or suspicious pulmonary nodules. Musculoskeletal: No aggressive osseous lesion. CT ABDOMEN AND PELVIS FINDINGS Hepatobiliary: Tiny hypodensity in LEFT hepatic lobe (image 59/2) is not changed from comparison PET scan. Small gallstone. No evidence of cholecystitis. Pancreas: Pancreas is normal. No ductal dilatation. No pancreatic inflammation. Spleen: Small hypodense lesion posterior spleen measuring 9 mm on image 62 is unchanged from comparison exams. Adrenals/urinary tract: Bilateral enlargement of the adrenal glands. Large RIGHT gland not changed from comparison PET-CT at which time it had low density consistent benign adenoma. There is fat density within the enlarged LEFT gland consistent with angiomyolipoma. Both benign lesions. Bilateral low-density renal lesions which have simple fluid attenuation on postcontrast imaging. No follow-up recommended for benign renal lesion. Normal bladder Stomach/Bowel: Stomach, small bowel, appendix, and cecum are normal. The colon and rectosigmoid colon are normal. Vascular/Lymphatic: Abdominal aorta is normal caliber with atherosclerotic calcification. There is no retroperitoneal or periportal lymphadenopathy. No pelvic lymphadenopathy. Reproductive: Prostate unremarkable Other: No free fluid. Musculoskeletal: No aggressive osseous lesion. IMPRESSION: CHEST: 1. Stable LEFT upper lobe peribronchial lesion. 2. No evidence of metastatic adenopathy. 3. Stable small RIGHT paratracheal node. 4. Slight improvement in atelectasis in the RIGHT middle lobe. PELVIS: 1. No evidence of metastatic disease in the abdomen pelvis. 2. Stable small hypodensity in the spleen and liver. 3. Stable benign RIGHT adrenal adenoma and and LEFT adrenal angiomyolipoma. Electronically Signed   By: Genevive Bi M.D.   On: 11/03/2023 13:12   DG Chest Port 1 View Result Date: 10/18/2023 CLINICAL DATA:  Respiratory distress EXAM: PORTABLE CHEST 1 VIEW COMPARISON:  10/15/2023, CT chest  07/12/2023 FINDINGS: Hyperinflation with emphysema. Focus of distortion in the left upper lobe corresponding to CT demonstrated spiculated nodule. Normal cardiomediastinal silhouette with aortic atherosclerosis. IMPRESSION: 1. Hyperinflation with emphysema. 2. Focus of distortion in the left upper lobe corresponding to CT demonstrated spiculated nodule, reference chest CT 07/12/2023 Electronically Signed   By: Jasmine Pang M.D.   On: 10/18/2023 23:35

## 2023-11-16 NOTE — Assessment & Plan Note (Signed)
 Immunotherapy plan as planned.

## 2023-11-16 NOTE — Assessment & Plan Note (Addendum)
 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  Left upper lobe adenocarcinoma, No enough tissue for NGS, will send off liquid biopsy- send off liquid biopsy.  S/p concurrent chemotherapy radiation. -carboplatin AUC 2 and taxol 45mg /m2   Clinically he is doing well today Labs are reviewed and discussed with patient. Proceed with Durvalumab maintenance.  CT scan showed no recurrence.

## 2023-11-16 NOTE — Assessment & Plan Note (Signed)
 Stable continue  potassium chloride daily,

## 2023-11-17 ENCOUNTER — Other Ambulatory Visit: Payer: Self-pay

## 2023-11-23 ENCOUNTER — Encounter: Payer: Self-pay | Admitting: Radiation Oncology

## 2023-11-23 ENCOUNTER — Ambulatory Visit
Admission: RE | Admit: 2023-11-23 | Discharge: 2023-11-23 | Disposition: A | Discharge status: Home / Self Care | Payer: 59 | Source: Ambulatory Visit | Attending: Radiation Oncology | Admitting: Radiation Oncology

## 2023-11-23 VITALS — BP 132/88 | HR 92 | Temp 97.0°F | Resp 20 | Wt 217.0 lb

## 2023-11-23 DIAGNOSIS — C3412 Malignant neoplasm of upper lobe, left bronchus or lung: Secondary | ICD-10-CM | POA: Diagnosis present

## 2023-11-23 DIAGNOSIS — Z923 Personal history of irradiation: Secondary | ICD-10-CM | POA: Diagnosis not present

## 2023-11-23 DIAGNOSIS — R918 Other nonspecific abnormal finding of lung field: Secondary | ICD-10-CM

## 2023-11-23 DIAGNOSIS — Z9221 Personal history of antineoplastic chemotherapy: Secondary | ICD-10-CM | POA: Insufficient documentation

## 2023-11-23 NOTE — Progress Notes (Signed)
 Radiation Oncology Follow up Note  Name: [name]   Date:   11/23/2023 MRN:  [id number] DOB: [birth year]    This 63 y.o. male presents to the clinic today for 13-month follow-up status post concurrent chemoradiation therapy for stage III (cT1 cN3 M0) adenocarcinoma the left lung.  REFERRING PROVIDER: Lorina Roosevelt, MD  HPI: Patient is a 63 year old male now out 5 months having completed concurrent chemoradiation therapy for stage IIIb adenocarcinoma left lung seen today in routine follow-up he is doing well specifically [place] cough any difficulty breathing or dysphagia.  He had a CT scan.  Last month of chest abdomen pelvis showing stable left upper lobe peribronchial lesion no evidence of metastatic adenopathy stable small right paratracheal nodes and slight improvement in atelectasis in the right middle lobe.  There was no other evidence of disease metastatic disease in abdomen or pelvis.  He is currently on maintenance Durvalumab which he is tolerating well  COMPLICATIONS OF TREATMENT: none  FOLLOW UP COMPLIANCE: keeps appointments   PHYSICAL EXAM:  BP 132/88   Pulse 92   Temp (!) 97 F (36.1 C) (Tympanic)   Resp 20   Wt 217 lb (98.4 kg)   BMI 28.63 kg/m  Well-developed well-nourished patient in NAD. HEENT reveals PERLA, EOMI, discs not visualized.  Oral cavity is clear. No oral mucosal lesions are identified. Neck is clear without evidence of cervical or supraclavicular adenopathy. Lungs are clear to A&P. Cardiac examination is essentially unremarkable with regular rate and rhythm without murmur rub or thrill. Abdomen is benign with no organomegaly or masses noted. Motor sensory and DTR levels are equal and symmetric in the upper and lower extremities. Cranial nerves II through XII are grossly intact. Proprioception is intact. No peripheral adenopathy or edema is identified. No motor or sensory levels are noted. Crude visual fields are within normal range.  RADIOLOGY RESULTS: CT scan  chest abdomen pelvis reviewed compatible with above-stated findings  PLAN: Present time patient is doing extremely well now at 5 months from concurrent chemoradiation think he continues on maintenance Durvalumab which he is tolerating well.  And pleased with his overall progress.  Of asked to see him back in 6 months for follow-up.  He continues close observation and treatment through medical oncology.  Patient knows to call with any concerns.  I would like to take this opportunity to thank you for allowing me to participate in the care of your patient.Glenis Langdon, MD

## 2023-11-24 ENCOUNTER — Other Ambulatory Visit: Payer: Self-pay

## 2023-11-30 ENCOUNTER — Inpatient Hospital Stay

## 2023-11-30 ENCOUNTER — Inpatient Hospital Stay: Admitting: Oncology

## 2023-12-01 ENCOUNTER — Inpatient Hospital Stay

## 2023-12-01 ENCOUNTER — Encounter: Payer: Self-pay | Admitting: Oncology

## 2023-12-01 ENCOUNTER — Inpatient Hospital Stay (HOSPITAL_BASED_OUTPATIENT_CLINIC_OR_DEPARTMENT_OTHER): Admitting: Oncology

## 2023-12-01 VITALS — BP 109/75 | HR 86 | Temp 96.5°F | Resp 16 | Wt 216.0 lb

## 2023-12-01 VITALS — BP 123/84 | HR 84 | Resp 17

## 2023-12-01 DIAGNOSIS — C3412 Malignant neoplasm of upper lobe, left bronchus or lung: Secondary | ICD-10-CM | POA: Diagnosis not present

## 2023-12-01 DIAGNOSIS — C3492 Malignant neoplasm of unspecified part of left bronchus or lung: Secondary | ICD-10-CM

## 2023-12-01 DIAGNOSIS — Z5112 Encounter for antineoplastic immunotherapy: Secondary | ICD-10-CM | POA: Diagnosis not present

## 2023-12-01 DIAGNOSIS — I2699 Other pulmonary embolism without acute cor pulmonale: Secondary | ICD-10-CM

## 2023-12-01 DIAGNOSIS — E876 Hypokalemia: Secondary | ICD-10-CM | POA: Diagnosis not present

## 2023-12-01 DIAGNOSIS — J449 Chronic obstructive pulmonary disease, unspecified: Secondary | ICD-10-CM | POA: Diagnosis not present

## 2023-12-01 LAB — CBC WITH DIFFERENTIAL (CANCER CENTER ONLY)
Abs Immature Granulocytes: 0.04 10*3/uL (ref 0.00–0.07)
Basophils Absolute: 0 10*3/uL (ref 0.0–0.1)
Basophils Relative: 1 %
Eosinophils Absolute: 0.1 10*3/uL (ref 0.0–0.5)
Eosinophils Relative: 2 %
HCT: 35.4 % — ABNORMAL LOW (ref 39.0–52.0)
Hemoglobin: 11.4 g/dL — ABNORMAL LOW (ref 13.0–17.0)
Immature Granulocytes: 1 %
Lymphocytes Relative: 32 %
Lymphs Abs: 1.1 10*3/uL (ref 0.7–4.0)
MCH: 28.7 pg (ref 26.0–34.0)
MCHC: 32.2 g/dL (ref 30.0–36.0)
MCV: 89.2 fL (ref 80.0–100.0)
Monocytes Absolute: 0.3 10*3/uL (ref 0.1–1.0)
Monocytes Relative: 8 %
Neutro Abs: 1.9 10*3/uL (ref 1.7–7.7)
Neutrophils Relative %: 56 %
Platelet Count: 182 10*3/uL (ref 150–400)
RBC: 3.97 MIL/uL — ABNORMAL LOW (ref 4.22–5.81)
RDW: 15.7 % — ABNORMAL HIGH (ref 11.5–15.5)
WBC Count: 3.4 10*3/uL — ABNORMAL LOW (ref 4.0–10.5)
nRBC: 0 % (ref 0.0–0.2)

## 2023-12-01 LAB — CMP (CANCER CENTER ONLY)
ALT: 12 U/L (ref 0–44)
AST: 17 U/L (ref 15–41)
Albumin: 3.8 g/dL (ref 3.5–5.0)
Alkaline Phosphatase: 63 U/L (ref 38–126)
Anion gap: 6 (ref 5–15)
BUN: 8 mg/dL (ref 8–23)
CO2: 25 mmol/L (ref 22–32)
Calcium: 9.1 mg/dL (ref 8.9–10.3)
Chloride: 105 mmol/L (ref 98–111)
Creatinine: 0.65 mg/dL (ref 0.61–1.24)
GFR, Estimated: >60 mL/min (ref >60)
Glucose, Bld: 102 mg/dL — ABNORMAL HIGH (ref 70–99)
Potassium: 3.7 mmol/L (ref 3.5–5.1)
Sodium: 136 mmol/L (ref 135–145)
Total Bilirubin: 0.4 mg/dL (ref 0.0–1.2)
Total Protein: 7.3 g/dL (ref 6.5–8.1)

## 2023-12-01 LAB — TSH: TSH: 0.32 u[IU]/mL — ABNORMAL LOW (ref 0.350–4.500)

## 2023-12-01 MED ORDER — SODIUM CHLORIDE 0.9 % IV SOLN
10.0000 mg/kg | Freq: Once | INTRAVENOUS | Status: AC
  Administered 2023-12-01: 1000 mg via INTRAVENOUS
  Filled 2023-12-01: qty 20

## 2023-12-01 MED ORDER — HEPARIN SOD (PORK) LOCK FLUSH 100 UNIT/ML IV SOLN
500.0000 [IU] | Freq: Once | INTRAVENOUS | Status: DC | PRN
  Filled 2023-12-01: qty 5

## 2023-12-01 MED ORDER — SODIUM CHLORIDE 0.9 % IV SOLN
INTRAVENOUS | Status: DC
Start: 2023-12-01 — End: 2023-12-01
  Filled 2023-12-01: qty 250

## 2023-12-01 NOTE — Assessment & Plan Note (Signed)
 Stable continue  potassium chloride daily,

## 2023-12-01 NOTE — Assessment & Plan Note (Signed)
 Continue albuterol inhaler PRN, Duonebs.  follow up with pulmonology Dr. Meredeth Ide.  Continue Singulair, mucinex.

## 2023-12-01 NOTE — Progress Notes (Signed)
Pt in for follow up, denies any concerns today. 

## 2023-12-01 NOTE — Assessment & Plan Note (Signed)
 Continue Elqiuis to 2.5mg  BID. He is on Plavix  75mg  daily.

## 2023-12-01 NOTE — Assessment & Plan Note (Signed)
 Immunotherapy plan as planned.

## 2023-12-01 NOTE — Progress Notes (Signed)
 Hematology/Oncology Progress note Telephone:([phone] Fax:([phone]      CHIEF COMPLAINTS/PURPOSE OF CONSULTATION:  Stage III Lung cancer  ASSESSMENT & PLAN:   Cancer Staging  Adenocarcinoma of lung [facility]) Staging form: Lung, AJCC 8th Edition - Clinical stage from 03/22/2023: cT1, cN3, cM0 - Signed by [name], MD on 03/22/2023   Adenocarcinoma of lung (HCC) Clinically he has Stage III left lung cancer He has right paratracheal lymph node hypermetabolic activity, although Station 7 lymph node showed atypical cell, no definite malignancy.  Left upper lobe adenocarcinoma, No enough tissue for NGS, will send off liquid biopsy- send off liquid biopsy.  S/p concurrent chemotherapy radiation. -carboplatin  AUC 2 and taxol  45mg /m2   Clinically he is doing well today Labs are reviewed and discussed with patient. Proceed with Durvalumab  maintenance.  CT scan showed no recurrence.     COPD (chronic obstructive pulmonary disease) (HCC) Continue albuterol  inhaler PRN, Duonebs.  follow up with pulmonology Dr. Jamal Mays.  Continue Singulair , mucinex .   Encounter for antineoplastic immunotherapy Immunotherapy plan as planned.   Hypokalemia Stable continue  potassium chloride  10meq daily,   Pulmonary embolism (HCC) Continue Elqiuis to 2.5mg  BID. He is on Plavix  75mg  daily.     Orders Placed This Encounter  Procedures   CBC with Differential (Cancer Center Only)    Standing Status:   Future    Expected Date:   12/28/2023    Expiration Date:   12/27/2024   CMP (Cancer Center only)    Standing Status:   Future    Expected Date:   12/28/2023    Expiration Date:   12/27/2024   T4    Standing Status:   Future    Expected Date:   12/28/2023    Expiration Date:   12/27/2024   TSH    Standing Status:   Future    Expected Date:   12/28/2023    Expiration Date:   12/27/2024   Follow up  2 weeks Lab MD All questions were answered. The patient knows to call the clinic with any  problems, questions or concerns.  [name], MD, PhD [facility] Hematology Oncology 12/01/2023    HISTORY OF PRESENTING ILLNESS:  [name] 63 y.o. male presents to establish care for lung mass I have reviewed his chart and materials related to his cancer extensively and collaborated history with the patient. Summary of oncologic history is as follows:  12/08/2021 CT chest with contrast showed irregular solid pulmonary nodule of right upper lobe, increased in size.  Bibasilar consolidation.  Mildly enlarged right lower paratracheal lymph node.  Atherosclerosis. 01/04/2022 PET scan showed 10 mm irregular nodule in the lateral right upper lobe, suspicious for primary bronchogenic neoplasm. 2 small right paratracheal nodes, indeterminate and favored to be reactive.  Attention on follow-up.  Patient was seen by radiation oncology Dr. Jacalyn Martin.  Patient was offered SBRT to the right upper lobe nodule/presumed non-small cell lung cancer stage I.  Patient lives in a group home.  He reports chronic cough.  Some shortness of breath with exertion. He is a current everyday smoker.  Few cigarettes per day.  He denies any unintentional weight loss, night sweats, fever or chills. Patient is on aspirin  and Plavix    INTERVAL HISTORY [name] is a 63 y.o. male who has above history reviewed by me today presents for follow up visit for lung cancer.  Oncology History  Adenocarcinoma of lung (HCC)  12/29/2022 Imaging   CT chest with contrast showed  Stable radiation changes involving the right upper lobe with a small residual treated nodule.  Decrease in size.  Interval enlargement of the left upper lobe pulmonary nodule.  Stable mediastinal nodes stable advanced emphysematous changes and areas of pulmonary scarring.  Stable bilateral adrenal gland adenomas.  Emphysema   01/30/2023 Imaging   PET 1. Recurrent bronchogenic carcinoma in the left upper lobe with new and increasingly hypermetabolic  mediastinal lymph nodes. No evidence of distant metastatic disease. 2. Possible mild prostate hypermetabolism. Consider laboratory correlation. 3. Liver is mildly heterogeneous, raising suspicion for steatosis. 4. Cholelithiasis. 5. Right adrenal adenoma.  Left adrenal myelolipoma. 6. Aortic atherosclerosis (ICD10-I70.0). Coronary artery calcification. 7. Enlarged pulmonic trunk, indicative of pulmonary arterial hypertension.   03/15/2023 Initial Diagnosis   Adenocarcinoma of left lung   Bronchoscopy showed  Lung, biopsy, Left upper lobe - NON-SMALL CELL CARCINOMA, FAVOR ADENOCARCINOMA.  1. Bronchial Lavage, Left upper lobe - POSITIVE FOR MALIGNANCY. - NON-SMALL CELL CARCINOMA, FAVOR ADENOCARCINOMA. - SEE NOTE. 2. Bronchial Brushing, Left upper lobe - POSITIVE FOR MALIGNANCY. - NON-SMALL CELL CARCINOMA, FAVOR ADENOCARCINOMA. 3. Bronchus, biopsy, left upper lobe - SEE [id number] 4. Fine Needle Aspiration, left upper lobe - POSITIVE FOR MALIGNANCY. - NON-SMALL CELL CARCINOMA, FAVOR ADENOCARCINOMA. 5. Fine Needle Aspiration, station 7 lymph node - ATYPICAL. - BACKGROUND LYMPHOID TISSUE IS PRESENT CONSISTENT WITH LYMPH NODE SAMPLING.    03/15/2023 Procedure   Fiberoptic bronchoscopy with airway inspection and BAL Procedure findings:   Bronchoscope was inserted via ETT  without difficulty.  Posterior oropharynx, epiglottis, arytenoids, false cords and vocal cords were not visualized as these were bypassed by endotracheal tube. The distal trachea was normal in circumference and appearance without mucosal, cartilaginous or branching abnormalities.  The main carina was mildly splayed . All right and left lobar airways were visualized to the Subsegmental level.  Sub- sub segmental carinae were identified in all the distal airways.   Secretions were visible in the following airways and appeared to be clear.  The mucosa was : friable at LEFT UPPER LOBE   Airways were notable for:         exophytic lesions :n       extrinsic compression in the following distributions: n.       Friable mucosa: y       Teacher, music /pigmentation: n   MUCUS PLUGGING WAS SEVERE ON LEFT SIDE AND COMPLETE OBSTRUCTED MULTIPLE AIRWAYS.  UNABLE TO NAVIGATE THROUGH TRACHEOBRONCHIAL TREE DUE TO SEVERE MUCUS PLUGGING. THERAPEUTIC ASPIRATION WAS PERFORMED X 7 AND WAS DONE BILATERALLY ALTHOUGH WORSE ON LEFT.  BAL WAS PERFORMED AT LEFT UPPER LOBE X 2.     Endobronchial ultrasound assisted hilar and mediastinal lymph node biopsies procedure findings: The fiberoptic bronchoscope was removed and the EBUS scope was introduced. Examination began to evaluate for pathologically enlarged lymph nodes starting on the RIGHT  side progressing to the LEFT side.  All lymph node biopsies performed with 21G  needle. Lymph node biopsies were sent in cytolite for all stations.   STATION 10R - 6mm not biopsied STATION 7 - 1.2cm biopsied 3 times STATION 10L - 5mm not biopsied  STATION 4L - 6mm not biopsied    03/22/2023 Cancer Staging   Staging form: Lung, AJCC 8th Edition - Clinical stage from 03/22/2023: cT1, cN3, cM0 - Signed by [name], MD on 03/22/2023 Stage prefix: Initial diagnosis   04/20/2023 - 06/08/2023 Chemotherapy   Patient is on Treatment Plan : LUNG Carboplatin  + Paclitaxel  + XRT q7d  09/01/2023 -  Chemotherapy   Patient is on Treatment Plan : LUNG Durvalumab  (10) q14d     10/15/2023 - 10/24/2023 Hospital Admission   Hospitalized due to Influenza A, COPD exacerbation, acute respiratory failure.    11/03/2023 Imaging   CT chest abdomen pelvis w contrast showed CHEST:   1. Stable LEFT upper lobe peribronchial lesion. 2. No evidence of metastatic adenopathy. 3. Stable small RIGHT paratracheal node. 4. Slight improvement in atelectasis in the RIGHT middle lobe.   PELVIS:   1. No evidence of metastatic disease in the abdomen pelvis. 2. Stable small hypodensity in the spleen and liver. 3. Stable  benign RIGHT adrenal adenoma and and LEFT adrenal angiomyolipoma.      He reports feel well Chronic cough and SOB, he feels breathing status is at baseline today he is on Eliquis  2.5mg  BID and Plavix .     MEDICAL HISTORY:  Past Medical History:  Diagnosis Date   Acute on chronic respiratory failure with hypoxia (HCC)    Asthma    Coagulation disorder (HCC)    COPD (chronic obstructive pulmonary disease) (HCC)    Depression    History of hiatal hernia    Hypertension    Hypokalemia    Leukocytosis    Lung mass    Normocytic anemia 07/13/2023   Pneumonia    Schizophrenia (HCC)    Shortness of breath dyspnea    Stroke [facility])    Umbilical hernia    Ventral hernia     SURGICAL HISTORY: Past Surgical History:  Procedure Laterality Date   COLONOSCOPY WITH PROPOFOL  N/A 09/21/2021   Procedure: COLONOSCOPY WITH PROPOFOL ;  Surgeon: Shane Darling, MD;  Location: [facility] ENDOSCOPY;  Service: Endoscopy;  Laterality: N/A;   HERNIA REPAIR     INSERTION OF MESH N/A 12/18/2014   Procedure: INSERTION OF MESH;  Surgeon: Claudia Cuff, MD;  Location: [facility] ORS;  Service: General;  Laterality: N/A;   SUPRA-UMBILICAL HERNIA  12/18/2014   Procedure: SUPRA-UMBILICAL HERNIA;  Surgeon: Claudia Cuff, MD;  Location: [facility] ORS;  Service: General;;   UMBILICAL HERNIA REPAIR N/A 12/18/2014   Procedure: HERNIA REPAIR UMBILICAL ADULT;  Surgeon: Claudia Cuff, MD;  Location: [facility] ORS;  Service: General;  Laterality: N/A;   VIDEO BRONCHOSCOPY WITH ENDOBRONCHIAL ULTRASOUND N/A 03/15/2023   Procedure: VIDEO BRONCHOSCOPY WITH ENDOBRONCHIAL ULTRASOUND;  Surgeon: Erskin Hearing, MD;  Location: [facility] ORS;  Service: Thoracic;  Laterality: N/A;    SOCIAL HISTORY: Social History   Socioeconomic History   Marital status: Widowed    Spouse name: Not on file   Number of children: Not on file   Years of education: Not on file   Highest education level: Not on file  Occupational History   Not on file   Tobacco Use   Smoking status: Former    Current packs/day: 0.15    Average packs/day: 0.2 packs/day for 45.0 years (6.8 ttl pk-yrs)    Types: Cigarettes   Smokeless tobacco: Never  Vaping Use   Vaping status: Never Used  Substance and Sexual Activity   Alcohol use: Not Currently    Alcohol/week: 75.0 standard drinks of alcohol    Types: 75 Cans of beer per week   Drug use: Not Currently    Types: Marijuana, "Crack" cocaine    Comment: none in 15 yrs   Sexual activity: Not Currently  Other Topics Concern   Not on file  Social History Narrative   Not on file   Social Drivers of  Health   Financial Resource Strain: Medium Risk (08/18/2023)   Received from [facility]   Overall Financial Resource Strain (CARDIA)    Difficulty of Paying Living Expenses: Somewhat hard  Food Insecurity: No Food Insecurity (10/17/2023)   Hunger Vital Sign    Worried About Running Out of Food in the Last Year: Never true    Ran Out of Food in the Last Year: Never true  Transportation Needs: No Transportation Needs (10/17/2023)   PRAPARE - Administrator, Civil Service (Medical): No    Lack of Transportation (Non-Medical): No  Physical Activity: Not on file  Stress: Not on file  Social Connections: Not on file  Intimate Partner Violence: Not At Risk (10/17/2023)   Humiliation, Afraid, Rape, and Kick questionnaire    Fear of Current or Ex-Partner: No    Emotionally Abused: No    Physically Abused: No    Sexually Abused: No    FAMILY HISTORY: Family History  Problem Relation Age of Onset   Asthma Mother    Hypertension Father     ALLERGIES:  has no known allergies.  MEDICATIONS:  Current Outpatient Medications  Medication Sig Dispense Refill   albuterol  (VENTOLIN  HFA) 108 (90 Base) MCG/ACT inhaler Inhale 1-2 puffs into the lungs every 6 (six) hours as needed for wheezing or shortness of breath. 8 g 0   apixaban  (ELIQUIS ) 2.5 MG TABS tablet Take 1 tablet (2.5 mg  total) by mouth 2 (two) times daily. 60 tablet 3   clopidogrel  (PLAVIX ) 75 MG tablet Take 1 tablet (75 mg total) by mouth daily. 30 tablet 0   famotidine  (PEPCID ) 20 MG tablet Take 1 tablet (20 mg total) by mouth daily. 30 tablet 0   FLUoxetine  (PROZAC ) 20 MG capsule Take 1 capsule (20 mg total) by mouth daily. 30 capsule 3   Fluticasone -Umeclidin-Vilant (TRELEGY ELLIPTA ) 100-62.5-25 MCG/ACT AEPB Inhale 1 puff into the lungs daily. 28 each 1   ipratropium-albuterol  (DUONEB) 0.5-2.5 (3) MG/3ML SOLN Take 3 mLs by nebulization 3 (three) times daily. 360 mL 1   mirtazapine  (REMERON ) 7.5 MG tablet Take 1 tablet (7.5 mg total) by mouth at bedtime. 30 tablet 5   montelukast  (SINGULAIR ) 10 MG tablet Take 1 tablet (10 mg total) by mouth at bedtime. 30 tablet 1   potassium chloride  (KLOR-CON  M) 10 MEQ tablet Take 1 tablet (10 mEq total) by mouth daily. 30 tablet 1   pravastatin  (PRAVACHOL ) 20 MG tablet Take 1 tablet (20 mg total) by mouth daily at 6 PM. 30 tablet 0   QUEtiapine  (SEROQUEL ) 300 MG tablet Take 2 tablets (600 mg total) by mouth at bedtime. 60 tablet 0   senna-docusate (SENOKOT-S) 8.6-50 MG tablet Take 1 tablet by mouth daily after breakfast. 30 tablet 0   traZODone  (DESYREL ) 100 MG tablet Take 2 tablets (200 mg total) by mouth at bedtime as needed for sleep. 30 tablet 0   guaiFENesin  (MUCINEX ) 600 MG 12 hr tablet Take 1 tablet (600 mg total) by mouth 2 (two) times daily. (Patient not taking: Reported on 12/01/2023) 30 tablet 1   No current facility-administered medications for this visit.   Facility-Administered Medications Ordered in Other Visits  Medication Dose Route Frequency Provider Last Rate Last Admin   0.9 %  sodium chloride  infusion   Intravenous Continuous [name], MD   Stopped at 12/01/23 1205   heparin  lock flush 100 unit/mL  500 Units Intracatheter Once PRN [name], MD  Review of Systems  Constitutional:  Negative for appetite change, chills, fatigue, fever and  unexpected weight change.  HENT:   Negative for hearing loss and voice change.   Eyes:  Negative for eye problems and icterus.  Respiratory:  Negative for chest tightness, cough and hemoptysis.   Cardiovascular:  Negative for chest pain and leg swelling.  Gastrointestinal:  Negative for abdominal distention and abdominal pain.  Endocrine: Negative for hot flashes.  Genitourinary:  Negative for difficulty urinating, dysuria and frequency.   Musculoskeletal:  Negative for arthralgias.  Skin:  Negative for itching and rash.  Neurological:  Negative for light-headedness and numbness.  Hematological:  Negative for adenopathy. Does not bruise/bleed easily.  Psychiatric/Behavioral:  Negative for confusion.      PHYSICAL EXAMINATION: ECOG PERFORMANCE STATUS: 1 - Symptomatic but completely ambulatory  Vitals:   12/01/23 0923  BP: 109/75  Pulse: 86  Resp: 16  Temp: (!) 96.5 F (35.8 C)   Filed Weights   12/01/23 0923  Weight: 216 lb (98 kg)    Physical Exam Constitutional:      General: He is not in acute distress.    Appearance: He is not diaphoretic.  HENT:     Head: Normocephalic and atraumatic.  Eyes:     General: No scleral icterus. Cardiovascular:     Rate and Rhythm: Normal rate and regular rhythm.  Pulmonary:     Effort: Pulmonary effort is normal. No respiratory distress.     Breath sounds: No wheezing.     Comments: Decreased breath sound bilaterally, poor air entry Abdominal:     General: There is no distension.     Palpations: Abdomen is soft.     Tenderness: There is no abdominal tenderness.  Musculoskeletal:        General: Normal range of motion.     Cervical back: Normal range of motion and neck supple.  Skin:    General: Skin is warm and dry.     Findings: No erythema.  Neurological:     Mental Status: He is alert and oriented to person, place, and time. Mental status is at baseline.     Motor: No abnormal muscle tone.  Psychiatric:        Mood and  Affect: Affect normal.     Comments: Flat      LABORATORY DATA:  I have reviewed the data as listed    Latest Ref Rng & Units 12/01/2023    9:03 AM 11/16/2023   12:44 PM 11/02/2023    8:11 AM  CBC  WBC 4.0 - 10.5 K/uL 3.4  5.1  4.0   Hemoglobin 13.0 - 17.0 g/dL 16.1  09.6  04.5   Hematocrit 39.0 - 52.0 % 35.4  37.0  34.8   Platelets 150 - 400 K/uL 182  204  233       Latest Ref Rng & Units 12/01/2023    9:04 AM 11/16/2023   12:44 PM 11/02/2023    8:11 AM  CMP  Glucose 70 - 99 mg/dL 409  93  811   BUN 8 - 23 mg/dL 8  8  5    Creatinine 0.61 - 1.24 mg/dL 9.14  7.82  9.56   Sodium 135 - 145 mmol/L 136  139  138   Potassium 3.5 - 5.1 mmol/L 3.7  4.2  3.7   Chloride 98 - 111 mmol/L 105  107  104   CO2 22 - 32 mmol/L 25  25  22  Calcium 8.9 - 10.3 mg/dL 9.1  9.4  9.2   Total Protein 6.5 - 8.1 g/dL 7.3  7.4  7.2   Total Bilirubin 0.0 - 1.2 mg/dL 0.4  0.4  0.5   Alkaline Phos 38 - 126 U/L 63  59  64   AST 15 - 41 U/L 17  18  16    ALT 0 - 44 U/L 12  11  12       RADIOGRAPHIC STUDIES: I have personally reviewed the radiological images as listed and agreed with the findings in the report. No results found.

## 2023-12-01 NOTE — Assessment & Plan Note (Signed)
 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  Left upper lobe adenocarcinoma, No enough tissue for NGS, will send off liquid biopsy- send off liquid biopsy.  S/p concurrent chemotherapy radiation. -carboplatin AUC 2 and taxol 45mg /m2   Clinically he is doing well today Labs are reviewed and discussed with patient. Proceed with Durvalumab maintenance.  CT scan showed no recurrence.

## 2023-12-01 NOTE — Patient Instructions (Signed)
 [facility] - A DEPT OF [facility]  Discharge Instructions: Thank you for choosing [facility] to provide your oncology and hematology care.  If you have a lab appointment with the Cancer Center, please go directly to the Cancer Center and check in at the registration area.  Wear comfortable clothing and clothing appropriate for easy access to any Portacath or PICC line.   We strive to give you quality time with your provider. You may need to reschedule your appointment if you arrive late (15 or more minutes).  Arriving late affects you and other patients whose appointments are after yours.  Also, if you miss three or more appointments without notifying the office, you may be dismissed from the clinic at the provider's discretion.      For prescription refill requests, have your pharmacy contact our office and allow 72 hours for refills to be completed.    Today you received the following chemotherapy and/or immunotherapy agents imfinzi      To help prevent nausea and vomiting after your treatment, we encourage you to take your nausea medication as directed.  BELOW ARE SYMPTOMS THAT SHOULD BE REPORTED IMMEDIATELY: *FEVER GREATER THAN 100.4 F (38 C) OR HIGHER *CHILLS OR SWEATING *NAUSEA AND VOMITING THAT IS NOT CONTROLLED WITH YOUR NAUSEA MEDICATION *UNUSUAL SHORTNESS OF BREATH *UNUSUAL BRUISING OR BLEEDING *URINARY PROBLEMS (pain or burning when urinating, or frequent urination) *BOWEL PROBLEMS (unusual diarrhea, constipation, pain near the anus) TENDERNESS IN MOUTH AND THROAT WITH OR WITHOUT PRESENCE OF ULCERS (sore throat, sores in mouth, or a toothache) UNUSUAL RASH, SWELLING OR PAIN  UNUSUAL VAGINAL DISCHARGE OR ITCHING   Items with * indicate a potential emergency and should be followed up as soon as possible or go to the Emergency Department if any problems should occur.  Please show the CHEMOTHERAPY ALERT CARD or IMMUNOTHERAPY  ALERT CARD at check-in to the Emergency Department and triage nurse.  Should you have questions after your visit or need to cancel or reschedule your appointment, please contact [facility] - A DEPT OF [facility]  [phone] and follow the prompts.  Office hours are 8:00 a.m. to 4:30 p.m. Monday - Friday. Please note that voicemails left after 4:00 p.m. may not be returned until the following business day.  We are closed weekends and major holidays. You have access to a nurse at all times for urgent questions. Please call the main number to the clinic [phone] and follow the prompts.  For any non-urgent questions, you may also contact your provider using MyChart. We now offer e-Visits for anyone 4 and older to request care online for non-urgent symptoms. For details visit [email or web address].   Also download the MyChart app! Go to the app store, search "MyChart", open the app, select [facility], and log in with your MyChart username and password.

## 2023-12-02 LAB — T4: T4, Total: 5.4 ug/dL (ref 4.5–12.0)

## 2023-12-14 ENCOUNTER — Inpatient Hospital Stay

## 2023-12-14 ENCOUNTER — Inpatient Hospital Stay: Admitting: Oncology

## 2023-12-14 ENCOUNTER — Encounter: Payer: Self-pay | Admitting: Oncology

## 2023-12-14 ENCOUNTER — Inpatient Hospital Stay: Attending: Oncology

## 2023-12-14 VITALS — BP 119/74 | HR 89 | Temp 97.4°F | Resp 16 | Wt 219.0 lb

## 2023-12-14 DIAGNOSIS — E876 Hypokalemia: Secondary | ICD-10-CM

## 2023-12-14 DIAGNOSIS — J449 Chronic obstructive pulmonary disease, unspecified: Secondary | ICD-10-CM | POA: Insufficient documentation

## 2023-12-14 DIAGNOSIS — I10 Essential (primary) hypertension: Secondary | ICD-10-CM | POA: Diagnosis not present

## 2023-12-14 DIAGNOSIS — Z923 Personal history of irradiation: Secondary | ICD-10-CM | POA: Insufficient documentation

## 2023-12-14 DIAGNOSIS — I2699 Other pulmonary embolism without acute cor pulmonale: Secondary | ICD-10-CM

## 2023-12-14 DIAGNOSIS — I2510 Atherosclerotic heart disease of native coronary artery without angina pectoris: Secondary | ICD-10-CM | POA: Diagnosis not present

## 2023-12-14 DIAGNOSIS — C3492 Malignant neoplasm of unspecified part of left bronchus or lung: Secondary | ICD-10-CM

## 2023-12-14 DIAGNOSIS — C3412 Malignant neoplasm of upper lobe, left bronchus or lung: Secondary | ICD-10-CM | POA: Diagnosis present

## 2023-12-14 DIAGNOSIS — D3501 Benign neoplasm of right adrenal gland: Secondary | ICD-10-CM | POA: Insufficient documentation

## 2023-12-14 DIAGNOSIS — K8020 Calculus of gallbladder without cholecystitis without obstruction: Secondary | ICD-10-CM | POA: Diagnosis not present

## 2023-12-14 DIAGNOSIS — Z7901 Long term (current) use of anticoagulants: Secondary | ICD-10-CM | POA: Insufficient documentation

## 2023-12-14 DIAGNOSIS — J4489 Other specified chronic obstructive pulmonary disease: Secondary | ICD-10-CM | POA: Diagnosis not present

## 2023-12-14 DIAGNOSIS — F209 Schizophrenia, unspecified: Secondary | ICD-10-CM | POA: Diagnosis not present

## 2023-12-14 DIAGNOSIS — Z79899 Other long term (current) drug therapy: Secondary | ICD-10-CM | POA: Diagnosis not present

## 2023-12-14 DIAGNOSIS — Z8673 Personal history of transient ischemic attack (TIA), and cerebral infarction without residual deficits: Secondary | ICD-10-CM | POA: Diagnosis not present

## 2023-12-14 DIAGNOSIS — Z9221 Personal history of antineoplastic chemotherapy: Secondary | ICD-10-CM | POA: Insufficient documentation

## 2023-12-14 DIAGNOSIS — D3502 Benign neoplasm of left adrenal gland: Secondary | ICD-10-CM | POA: Insufficient documentation

## 2023-12-14 DIAGNOSIS — Z5112 Encounter for antineoplastic immunotherapy: Secondary | ICD-10-CM | POA: Insufficient documentation

## 2023-12-14 DIAGNOSIS — I700 Atherosclerosis of aorta: Secondary | ICD-10-CM | POA: Insufficient documentation

## 2023-12-14 DIAGNOSIS — F17210 Nicotine dependence, cigarettes, uncomplicated: Secondary | ICD-10-CM | POA: Insufficient documentation

## 2023-12-14 LAB — CBC WITH DIFFERENTIAL (CANCER CENTER ONLY)
Abs Immature Granulocytes: 0.01 10*3/uL (ref 0.00–0.07)
Basophils Absolute: 0 10*3/uL (ref 0.0–0.1)
Basophils Relative: 1 %
Eosinophils Absolute: 0.1 10*3/uL (ref 0.0–0.5)
Eosinophils Relative: 3 %
HCT: 35.4 % — ABNORMAL LOW (ref 39.0–52.0)
Hemoglobin: 11.4 g/dL — ABNORMAL LOW (ref 13.0–17.0)
Immature Granulocytes: 0 %
Lymphocytes Relative: 33 %
Lymphs Abs: 1.2 10*3/uL (ref 0.7–4.0)
MCH: 28.8 pg (ref 26.0–34.0)
MCHC: 32.2 g/dL (ref 30.0–36.0)
MCV: 89.4 fL (ref 80.0–100.0)
Monocytes Absolute: 0.3 10*3/uL (ref 0.1–1.0)
Monocytes Relative: 9 %
Neutro Abs: 2 10*3/uL (ref 1.7–7.7)
Neutrophils Relative %: 54 %
Platelet Count: 177 10*3/uL (ref 150–400)
RBC: 3.96 MIL/uL — ABNORMAL LOW (ref 4.22–5.81)
RDW: 15.2 % (ref 11.5–15.5)
WBC Count: 3.7 10*3/uL — ABNORMAL LOW (ref 4.0–10.5)
nRBC: 0 % (ref 0.0–0.2)

## 2023-12-14 LAB — CMP (CANCER CENTER ONLY)
ALT: 9 U/L (ref 0–44)
AST: 12 U/L — ABNORMAL LOW (ref 15–41)
Albumin: 3.6 g/dL (ref 3.5–5.0)
Alkaline Phosphatase: 62 U/L (ref 38–126)
Anion gap: 8 (ref 5–15)
BUN: 7 mg/dL — ABNORMAL LOW (ref 8–23)
CO2: 24 mmol/L (ref 22–32)
Calcium: 9.1 mg/dL (ref 8.9–10.3)
Chloride: 106 mmol/L (ref 98–111)
Creatinine: 0.64 mg/dL (ref 0.61–1.24)
GFR, Estimated: >60 mL/min (ref >60)
Glucose, Bld: 100 mg/dL — ABNORMAL HIGH (ref 70–99)
Potassium: 3.9 mmol/L (ref 3.5–5.1)
Sodium: 138 mmol/L (ref 135–145)
Total Bilirubin: 0.5 mg/dL (ref 0.0–1.2)
Total Protein: 6.8 g/dL (ref 6.5–8.1)

## 2023-12-14 LAB — TSH: TSH: 0.239 u[IU]/mL — ABNORMAL LOW (ref 0.350–4.500)

## 2023-12-14 MED ORDER — DURVALUMAB 500 MG/10ML IV SOLN
10.0000 mg/kg | Freq: Once | INTRAVENOUS | Status: AC
  Administered 2023-12-14: 1000 mg via INTRAVENOUS
  Filled 2023-12-14: qty 20

## 2023-12-14 MED ORDER — SODIUM CHLORIDE 0.9 % IV SOLN
INTRAVENOUS | Status: DC
Start: 2023-12-14 — End: 2023-12-14
  Filled 2023-12-14 (×2): qty 250

## 2023-12-14 NOTE — Progress Notes (Signed)
 Hematology/Oncology Progress note Telephone:([phone] [phone]      CHIEF COMPLAINTS/PURPOSE OF CONSULTATION:  Stage III Lung cancer  ASSESSMENT & PLAN:   Cancer Staging  Adenocarcinoma of lung [facility]) Staging form: Lung, AJCC 8th Edition - Clinical stage from 03/22/2023: cT1, cN3, cM0 - Signed by [name], MD on 03/22/2023   Adenocarcinoma of lung (HCC) Clinically he has Stage III left lung cancer He has right paratracheal lymph node hypermetabolic activity, although Station 7 lymph node showed atypical cell, no definite malignancy.  Left upper lobe adenocarcinoma, No enough tissue for NGS, will send off liquid biopsy- send off liquid biopsy.  S/p concurrent chemotherapy radiation. -carboplatin  AUC 2 and taxol  45mg /m2   Clinically he is doing well today Labs are reviewed and discussed with patient. Proceed with Durvalumab  maintenance.  CT scan showed no recurrence.     Hypokalemia Stable continue  potassium chloride  10meq daily,   Pulmonary embolism (HCC) Continue Elqiuis to 2.5mg  BID. He is on Plavix  75mg  daily.    COPD (chronic obstructive pulmonary disease) (HCC) Continue albuterol  inhaler PRN, Duonebs.  follow up with pulmonology Dr. Jamal Mays.  Continue Singulair , mucinex .     No orders of the defined types were placed in this encounter.  Follow up  2 weeks Lab MD All questions were answered. The patient knows to call the clinic with any problems, questions or concerns.  [name], MD, PhD [facility] Hematology Oncology 12/14/2023    HISTORY OF PRESENTING ILLNESS:  [name] 63 y.o. male presents to establish care for lung mass I have reviewed his chart and materials related to his cancer extensively and collaborated history with the patient. Summary of oncologic history is as follows:  12/08/2021 CT chest with contrast showed irregular solid pulmonary nodule of right upper lobe, increased in size.  Bibasilar consolidation.  Mildly enlarged  right lower paratracheal lymph node.  Atherosclerosis. 01/04/2022 PET scan showed 10 mm irregular nodule in the lateral right upper lobe, suspicious for primary bronchogenic neoplasm. 2 small right paratracheal nodes, indeterminate and favored to be reactive.  Attention on follow-up.  Patient was seen by radiation oncology Dr. Jacalyn Martin.  Patient was offered SBRT to the right upper lobe nodule/presumed non-small cell lung cancer stage I.  Patient lives in a group home.  He reports chronic cough.  Some shortness of breath with exertion. He is a current everyday smoker.  Few cigarettes per day.  He denies any unintentional weight loss, night sweats, fever or chills. Patient is on aspirin  and Plavix    INTERVAL HISTORY [name] is a 63 y.o. male who has above history reviewed by me today presents for follow up visit for lung cancer.  Oncology History  Adenocarcinoma of lung (HCC)  12/29/2022 Imaging   CT chest with contrast showed Stable radiation changes involving the right upper lobe with a small residual treated nodule.  Decrease in size.  Interval enlargement of the left upper lobe pulmonary nodule.  Stable mediastinal nodes stable advanced emphysematous changes and areas of pulmonary scarring.  Stable bilateral adrenal gland adenomas.  Emphysema   01/30/2023 Imaging   PET 1. Recurrent bronchogenic carcinoma in the left upper lobe with new and increasingly hypermetabolic mediastinal lymph nodes. No evidence of distant metastatic disease. 2. Possible mild prostate hypermetabolism. Consider laboratory correlation. 3. Liver is mildly heterogeneous, raising suspicion for steatosis. 4. Cholelithiasis. 5. Right adrenal adenoma.  Left adrenal myelolipoma. 6. Aortic atherosclerosis (ICD10-I70.0). Coronary artery calcification. 7. Enlarged pulmonic trunk, indicative of pulmonary arterial  hypertension.   03/15/2023 Initial Diagnosis   Adenocarcinoma of left lung   Bronchoscopy showed  Lung,  biopsy, Left upper lobe - NON-SMALL CELL CARCINOMA, FAVOR ADENOCARCINOMA.  1. Bronchial Lavage, Left upper lobe - POSITIVE FOR MALIGNANCY. - NON-SMALL CELL CARCINOMA, FAVOR ADENOCARCINOMA. - SEE NOTE. 2. Bronchial Brushing, Left upper lobe - POSITIVE FOR MALIGNANCY. - NON-SMALL CELL CARCINOMA, FAVOR ADENOCARCINOMA. 3. Bronchus, biopsy, left upper lobe - SEE [id number] 4. Fine Needle Aspiration, left upper lobe - POSITIVE FOR MALIGNANCY. - NON-SMALL CELL CARCINOMA, FAVOR ADENOCARCINOMA. 5. Fine Needle Aspiration, station 7 lymph node - ATYPICAL. - BACKGROUND LYMPHOID TISSUE IS PRESENT CONSISTENT WITH LYMPH NODE SAMPLING.    03/15/2023 Procedure   Fiberoptic bronchoscopy with airway inspection and BAL Procedure findings:   Bronchoscope was inserted via ETT  without difficulty.  Posterior oropharynx, epiglottis, arytenoids, false cords and vocal cords were not visualized as these were bypassed by endotracheal tube. The distal trachea was normal in circumference and appearance without mucosal, cartilaginous or branching abnormalities.  The main carina was mildly splayed . All right and left lobar airways were visualized to the Subsegmental level.  Sub- sub segmental carinae were identified in all the distal airways.   Secretions were visible in the following airways and appeared to be clear.  The mucosa was : friable at LEFT UPPER LOBE   Airways were notable for:        exophytic lesions :n       extrinsic compression in the following distributions: n.       Friable mucosa: y       Teacher, music /pigmentation: n   MUCUS PLUGGING WAS SEVERE ON LEFT SIDE AND COMPLETE OBSTRUCTED MULTIPLE AIRWAYS.  UNABLE TO NAVIGATE THROUGH TRACHEOBRONCHIAL TREE DUE TO SEVERE MUCUS PLUGGING. THERAPEUTIC ASPIRATION WAS PERFORMED X 7 AND WAS DONE BILATERALLY ALTHOUGH WORSE ON LEFT.  BAL WAS PERFORMED AT LEFT UPPER LOBE X 2.     Endobronchial ultrasound assisted hilar and mediastinal lymph node  biopsies procedure findings: The fiberoptic bronchoscope was removed and the EBUS scope was introduced. Examination began to evaluate for pathologically enlarged lymph nodes starting on the RIGHT  side progressing to the LEFT side.  All lymph node biopsies performed with 21G  needle. Lymph node biopsies were sent in cytolite for all stations.   STATION 10R - 6mm not biopsied STATION 7 - 1.2cm biopsied 3 times STATION 10L - 5mm not biopsied  STATION 4L - 6mm not biopsied    03/22/2023 Cancer Staging   Staging form: Lung, AJCC 8th Edition - Clinical stage from 03/22/2023: cT1, cN3, cM0 - Signed by [name], MD on 03/22/2023 Stage prefix: Initial diagnosis   04/20/2023 - 06/08/2023 Chemotherapy   Patient is on Treatment Plan : LUNG Carboplatin  + Paclitaxel  + XRT q7d     09/01/2023 -  Chemotherapy   Patient is on Treatment Plan : LUNG Durvalumab  (10) q14d     10/15/2023 - 10/24/2023 Hospital Admission   Hospitalized due to Influenza A, COPD exacerbation, acute respiratory failure.    11/03/2023 Imaging   CT chest abdomen pelvis w contrast showed CHEST:   1. Stable LEFT upper lobe peribronchial lesion. 2. No evidence of metastatic adenopathy. 3. Stable small RIGHT paratracheal node. 4. Slight improvement in atelectasis in the RIGHT middle lobe.   PELVIS:   1. No evidence of metastatic disease in the abdomen pelvis. 2. Stable small hypodensity in the spleen and liver. 3. Stable benign RIGHT adrenal adenoma and and LEFT adrenal angiomyolipoma.  He reports feel well Chronic cough and SOB, he feels breathing status is at baseline today he is on Eliquis  2.5mg  BID and Plavix .     MEDICAL HISTORY:  Past Medical History:  Diagnosis Date   Acute on chronic respiratory failure with hypoxia (HCC)    Asthma    Coagulation disorder (HCC)    COPD (chronic obstructive pulmonary disease) (HCC)    Depression    History of hiatal hernia    Hypertension    Hypokalemia    Leukocytosis     Lung mass    Normocytic anemia 07/13/2023   Pneumonia    Schizophrenia (HCC)    Shortness of breath dyspnea    Stroke [facility])    Umbilical hernia    Ventral hernia     SURGICAL HISTORY: Past Surgical History:  Procedure Laterality Date   COLONOSCOPY WITH PROPOFOL  N/A 09/21/2021   Procedure: COLONOSCOPY WITH PROPOFOL ;  Surgeon: Shane Darling, MD;  Location: [facility] ENDOSCOPY;  Service: Endoscopy;  Laterality: N/A;   HERNIA REPAIR     INSERTION OF MESH N/A 12/18/2014   Procedure: INSERTION OF MESH;  Surgeon: Claudia Cuff, MD;  Location: [facility] ORS;  Service: General;  Laterality: N/A;   SUPRA-UMBILICAL HERNIA  12/18/2014   Procedure: SUPRA-UMBILICAL HERNIA;  Surgeon: Claudia Cuff, MD;  Location: [facility] ORS;  Service: General;;   UMBILICAL HERNIA REPAIR N/A 12/18/2014   Procedure: HERNIA REPAIR UMBILICAL ADULT;  Surgeon: Claudia Cuff, MD;  Location: [facility] ORS;  Service: General;  Laterality: N/A;   VIDEO BRONCHOSCOPY WITH ENDOBRONCHIAL ULTRASOUND N/A 03/15/2023   Procedure: VIDEO BRONCHOSCOPY WITH ENDOBRONCHIAL ULTRASOUND;  Surgeon: Erskin Hearing, MD;  Location: [facility] ORS;  Service: Thoracic;  Laterality: N/A;    SOCIAL HISTORY: Social History   Socioeconomic History   Marital status: Widowed    Spouse name: Not on file   Number of children: Not on file   Years of education: Not on file   Highest education level: Not on file  Occupational History   Not on file  Tobacco Use   Smoking status: Former    Current packs/day: 0.15    Average packs/day: 0.2 packs/day for 45.0 years (6.8 ttl pk-yrs)    Types: Cigarettes   Smokeless tobacco: Never  Vaping Use   Vaping status: Never Used  Substance and Sexual Activity   Alcohol use: Not Currently    Alcohol/week: 75.0 standard drinks of alcohol    Types: 75 Cans of beer per week   Drug use: Not Currently    Types: Marijuana, "Crack" cocaine    Comment: none in 15 yrs   Sexual activity: Not Currently  Other Topics Concern    Not on file  Social History Narrative   Not on file   Social Drivers of Health   Financial Resource Strain: Medium Risk (08/18/2023)   [facility]   Overall Financial Resource Strain (CARDIA)    Difficulty of Paying Living Expenses: Somewhat hard  Food Insecurity: No Food Insecurity (10/17/2023)   Hunger Vital Sign    Worried About Running Out of Food in the Last Year: Never true    Ran Out of Food in the Last Year: Never true  Transportation Needs: No Transportation Needs (10/17/2023)   PRAPARE - Administrator, Civil Service (Medical): No    Lack of Transportation (Non-Medical): No  Physical Activity: Not on file  Stress: Not on file  Social Connections: Not on file  Intimate Partner Violence:  Not At Risk (10/17/2023)   Humiliation, Afraid, Rape, and Kick questionnaire    Fear of Current or Ex-Partner: No    Emotionally Abused: No    Physically Abused: No    Sexually Abused: No    FAMILY HISTORY: Family History  Problem Relation Age of Onset   Asthma Mother    Hypertension Father     ALLERGIES:  has no known allergies.  MEDICATIONS:  Current Outpatient Medications  Medication Sig Dispense Refill   albuterol  (VENTOLIN  HFA) 108 (90 Base) MCG/ACT inhaler Inhale 1-2 puffs into the lungs every 6 (six) hours as needed for wheezing or shortness of breath. 8 g 0   apixaban  (ELIQUIS ) 2.5 MG TABS tablet Take 1 tablet (2.5 mg total) by mouth 2 (two) times daily. 60 tablet 3   clopidogrel  (PLAVIX ) 75 MG tablet Take 1 tablet (75 mg total) by mouth daily. 30 tablet 0   famotidine  (PEPCID ) 20 MG tablet Take 1 tablet (20 mg total) by mouth daily. 30 tablet 0   FLUoxetine  (PROZAC ) 20 MG capsule Take 1 capsule (20 mg total) by mouth daily. 30 capsule 3   Fluticasone -Umeclidin-Vilant (TRELEGY ELLIPTA ) 100-62.5-25 MCG/ACT AEPB Inhale 1 puff into the lungs daily. 28 each 1   ipratropium-albuterol  (DUONEB) 0.5-2.5 (3) MG/3ML SOLN Take 3 mLs by  nebulization 3 (three) times daily. 360 mL 1   mirtazapine  (REMERON ) 7.5 MG tablet Take 1 tablet (7.5 mg total) by mouth at bedtime. 30 tablet 5   montelukast  (SINGULAIR ) 10 MG tablet Take 1 tablet (10 mg total) by mouth at bedtime. 30 tablet 1   potassium chloride  (KLOR-CON  M) 10 MEQ tablet Take 1 tablet (10 mEq total) by mouth daily. 30 tablet 1   pravastatin  (PRAVACHOL ) 20 MG tablet Take 1 tablet (20 mg total) by mouth daily at 6 PM. 30 tablet 0   QUEtiapine  (SEROQUEL ) 300 MG tablet Take 2 tablets (600 mg total) by mouth at bedtime. 60 tablet 0   senna-docusate (SENOKOT-S) 8.6-50 MG tablet Take 1 tablet by mouth daily after breakfast. 30 tablet 0   traZODone  (DESYREL ) 100 MG tablet Take 2 tablets (200 mg total) by mouth at bedtime as needed for sleep. 30 tablet 0   guaiFENesin  (MUCINEX ) 600 MG 12 hr tablet Take 1 tablet (600 mg total) by mouth 2 (two) times daily. (Patient not taking: Reported on 12/14/2023) 30 tablet 1   No current facility-administered medications for this visit.   Facility-Administered Medications Ordered in Other Visits  Medication Dose Route Frequency Provider Last Rate Last Admin   0.9 %  sodium chloride  infusion   Intravenous Continuous [name], MD   Stopped at 12/14/23 1145    Review of Systems  Constitutional:  Negative for appetite change, chills, fatigue, fever and unexpected weight change.  HENT:   Negative for hearing loss and voice change.   Eyes:  Negative for eye problems and icterus.  Respiratory:  Negative for chest tightness, cough and hemoptysis.   Cardiovascular:  Negative for chest pain and leg swelling.  Gastrointestinal:  Negative for abdominal distention and abdominal pain.  Endocrine: Negative for hot flashes.  Genitourinary:  Negative for difficulty urinating, dysuria and frequency.   Musculoskeletal:  Negative for arthralgias.  Skin:  Negative for itching and rash.  Neurological:  Negative for light-headedness and numbness.  Hematological:   Negative for adenopathy. Does not bruise/bleed easily.  Psychiatric/Behavioral:  Negative for confusion.      PHYSICAL EXAMINATION: ECOG PERFORMANCE STATUS: 1 - Symptomatic but completely ambulatory  Vitals:   12/14/23 0901  BP: 119/74  Pulse: 89  Resp: 16  Temp: (!) 97.4 F (36.3 C)  SpO2: 97%   Filed Weights   12/14/23 0901  Weight: 219 lb (99.3 kg)    Physical Exam Constitutional:      General: He is not in acute distress.    Appearance: He is not diaphoretic.  HENT:     Head: Normocephalic and atraumatic.  Eyes:     General: No scleral icterus. Cardiovascular:     Rate and Rhythm: Normal rate and regular rhythm.  Pulmonary:     Effort: Pulmonary effort is normal. No respiratory distress.     Breath sounds: No wheezing.     Comments: Decreased breath sound bilaterally, poor air entry Abdominal:     General: There is no distension.     Palpations: Abdomen is soft.     Tenderness: There is no abdominal tenderness.  Musculoskeletal:        General: Normal range of motion.     Cervical back: Normal range of motion and neck supple.  Skin:    General: Skin is warm and dry.     Findings: No erythema.  Neurological:     Mental Status: He is alert and oriented to person, place, and time. Mental status is at baseline.     Motor: No abnormal muscle tone.  Psychiatric:        Mood and Affect: Affect normal.     Comments: Flat      LABORATORY DATA:  I have reviewed the data as listed    Latest Ref Rng & Units 12/14/2023    8:45 AM 12/01/2023    9:03 AM 11/16/2023   12:44 PM  CBC  WBC 4.0 - 10.5 K/uL 3.7  3.4  5.1   Hemoglobin 13.0 - 17.0 g/dL 91.4  78.2  95.6   Hematocrit 39.0 - 52.0 % 35.4  35.4  37.0   Platelets 150 - 400 K/uL 177  182  204       Latest Ref Rng & Units 12/14/2023    8:46 AM 12/01/2023    9:04 AM 11/16/2023   12:44 PM  CMP  Glucose 70 - 99 mg/dL 213  086  93   BUN 8 - 23 mg/dL 7  8  8    Creatinine 0.61 - 1.24 mg/dL 5.78  4.69  6.29   Sodium  135 - 145 mmol/L 138  136  139   Potassium 3.5 - 5.1 mmol/L 3.9  3.7  4.2   Chloride 98 - 111 mmol/L 106  105  107   CO2 22 - 32 mmol/L 24  25  25    Calcium 8.9 - 10.3 mg/dL 9.1  9.1  9.4   Total Protein 6.5 - 8.1 g/dL 6.8  7.3  7.4   Total Bilirubin 0.0 - 1.2 mg/dL 0.5  0.4  0.4   Alkaline Phos 38 - 126 U/L 62  63  59   AST 15 - 41 U/L 12  17  18    ALT 0 - 44 U/L 9  12  11       RADIOGRAPHIC STUDIES: I have personally reviewed the radiological images as listed and agreed with the findings in the report. No results found.

## 2023-12-14 NOTE — Assessment & Plan Note (Signed)
 Stable continue  potassium chloride daily,

## 2023-12-14 NOTE — Assessment & Plan Note (Signed)
 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  Left upper lobe adenocarcinoma, No enough tissue for NGS, will send off liquid biopsy- send off liquid biopsy.  S/p concurrent chemotherapy radiation. -carboplatin AUC 2 and taxol 45mg /m2   Clinically he is doing well today Labs are reviewed and discussed with patient. Proceed with Durvalumab maintenance.  CT scan showed no recurrence.

## 2023-12-14 NOTE — Assessment & Plan Note (Signed)
 Continue Elqiuis to 2.5mg  BID. He is on Plavix  75mg  daily.

## 2023-12-14 NOTE — Assessment & Plan Note (Signed)
 Continue albuterol inhaler PRN, Duonebs.  follow up with pulmonology Dr. Meredeth Ide.  Continue Singulair, mucinex.

## 2023-12-15 LAB — T4: T4, Total: 4.5 ug/dL (ref 4.5–12.0)

## 2023-12-28 ENCOUNTER — Encounter: Payer: Self-pay | Admitting: Oncology

## 2023-12-28 ENCOUNTER — Inpatient Hospital Stay (HOSPITAL_BASED_OUTPATIENT_CLINIC_OR_DEPARTMENT_OTHER): Admitting: Oncology

## 2023-12-28 ENCOUNTER — Inpatient Hospital Stay

## 2023-12-28 VITALS — BP 120/86 | HR 94 | Temp 97.3°F | Resp 18 | Wt 215.0 lb

## 2023-12-28 DIAGNOSIS — C3492 Malignant neoplasm of unspecified part of left bronchus or lung: Secondary | ICD-10-CM

## 2023-12-28 DIAGNOSIS — C3412 Malignant neoplasm of upper lobe, left bronchus or lung: Secondary | ICD-10-CM | POA: Diagnosis not present

## 2023-12-28 DIAGNOSIS — E876 Hypokalemia: Secondary | ICD-10-CM

## 2023-12-28 DIAGNOSIS — I2699 Other pulmonary embolism without acute cor pulmonale: Secondary | ICD-10-CM

## 2023-12-28 DIAGNOSIS — J449 Chronic obstructive pulmonary disease, unspecified: Secondary | ICD-10-CM | POA: Diagnosis not present

## 2023-12-28 DIAGNOSIS — Z5112 Encounter for antineoplastic immunotherapy: Secondary | ICD-10-CM | POA: Diagnosis not present

## 2023-12-28 LAB — CBC WITH DIFFERENTIAL (CANCER CENTER ONLY)
Abs Immature Granulocytes: 0.01 10*3/uL (ref 0.00–0.07)
Basophils Absolute: 0 10*3/uL (ref 0.0–0.1)
Basophils Relative: 1 %
Eosinophils Absolute: 0.1 10*3/uL (ref 0.0–0.5)
Eosinophils Relative: 2 %
HCT: 37.4 % — ABNORMAL LOW (ref 39.0–52.0)
Hemoglobin: 12.2 g/dL — ABNORMAL LOW (ref 13.0–17.0)
Immature Granulocytes: 0 %
Lymphocytes Relative: 34 %
Lymphs Abs: 1.5 10*3/uL (ref 0.7–4.0)
MCH: 29 pg (ref 26.0–34.0)
MCHC: 32.6 g/dL (ref 30.0–36.0)
MCV: 88.8 fL (ref 80.0–100.0)
Monocytes Absolute: 0.4 10*3/uL (ref 0.1–1.0)
Monocytes Relative: 9 %
Neutro Abs: 2.3 10*3/uL (ref 1.7–7.7)
Neutrophils Relative %: 54 %
Platelet Count: 195 10*3/uL (ref 150–400)
RBC: 4.21 MIL/uL — ABNORMAL LOW (ref 4.22–5.81)
RDW: 14.9 % (ref 11.5–15.5)
WBC Count: 4.3 10*3/uL (ref 4.0–10.5)
nRBC: 0 % (ref 0.0–0.2)

## 2023-12-28 LAB — CMP (CANCER CENTER ONLY)
ALT: 12 U/L (ref 0–44)
AST: 17 U/L (ref 15–41)
Albumin: 4 g/dL (ref 3.5–5.0)
Alkaline Phosphatase: 73 U/L (ref 38–126)
Anion gap: 8 (ref 5–15)
BUN: 7 mg/dL — ABNORMAL LOW (ref 8–23)
CO2: 24 mmol/L (ref 22–32)
Calcium: 9.2 mg/dL (ref 8.9–10.3)
Chloride: 107 mmol/L (ref 98–111)
Creatinine: 0.58 mg/dL — ABNORMAL LOW (ref 0.61–1.24)
GFR, Estimated: >60 mL/min (ref >60)
Glucose, Bld: 104 mg/dL — ABNORMAL HIGH (ref 70–99)
Potassium: 3.9 mmol/L (ref 3.5–5.1)
Sodium: 139 mmol/L (ref 135–145)
Total Bilirubin: 0.5 mg/dL (ref 0.0–1.2)
Total Protein: 7.4 g/dL (ref 6.5–8.1)

## 2023-12-28 LAB — TSH: TSH: 0.762 u[IU]/mL (ref 0.350–4.500)

## 2023-12-28 MED ORDER — SODIUM CHLORIDE 0.9 % IV SOLN
10.0000 mg/kg | Freq: Once | INTRAVENOUS | Status: AC
  Administered 2023-12-28: 1000 mg via INTRAVENOUS
  Filled 2023-12-28: qty 20

## 2023-12-28 MED ORDER — SODIUM CHLORIDE 0.9 % IV SOLN
INTRAVENOUS | Status: DC
  Filled 2023-12-28: qty 250

## 2023-12-28 NOTE — Patient Instructions (Signed)
 [facility] - A DEPT OF [facility]  Discharge Instructions: Thank you for choosing [facility] to provide your oncology and hematology care.  If you have a lab appointment with the Cancer Center, please go directly to the Cancer Center and check in at the registration area.  Wear comfortable clothing and clothing appropriate for easy access to any Portacath or PICC line.   We strive to give you quality time with your provider. You may need to reschedule your appointment if you arrive late (15 or more minutes).  Arriving late affects you and other patients whose appointments are after yours.  Also, if you miss three or more appointments without notifying the office, you may be dismissed from the clinic at the provider's discretion.      For prescription refill requests, have your pharmacy contact our office and allow 72 hours for refills to be completed.    Today you received the following chemotherapy and/or immunotherapy agents Imfinzi      To help prevent nausea and vomiting after your treatment, we encourage you to take your nausea medication as directed.  BELOW ARE SYMPTOMS THAT SHOULD BE REPORTED IMMEDIATELY: *FEVER GREATER THAN 100.4 F (38 C) OR HIGHER *CHILLS OR SWEATING *NAUSEA AND VOMITING THAT IS NOT CONTROLLED WITH YOUR NAUSEA MEDICATION *UNUSUAL SHORTNESS OF BREATH *UNUSUAL BRUISING OR BLEEDING *URINARY PROBLEMS (pain or burning when urinating, or frequent urination) *BOWEL PROBLEMS (unusual diarrhea, constipation, pain near the anus) TENDERNESS IN MOUTH AND THROAT WITH OR WITHOUT PRESENCE OF ULCERS (sore throat, sores in mouth, or a toothache) UNUSUAL RASH, SWELLING OR PAIN  UNUSUAL VAGINAL DISCHARGE OR ITCHING   Items with * indicate a potential emergency and should be followed up as soon as possible or go to the Emergency Department if any problems should occur.  Please show the CHEMOTHERAPY ALERT CARD or IMMUNOTHERAPY  ALERT CARD at check-in to the Emergency Department and triage nurse.  Should you have questions after your visit or need to cancel or reschedule your appointment, please contact [facility] - A DEPT OF [facility]  [phone] and follow the prompts.  Office hours are 8:00 a.m. to 4:30 p.m. Monday - Friday. Please note that voicemails left after 4:00 p.m. may not be returned until the following business day.  We are closed weekends and major holidays. You have access to a nurse at all times for urgent questions. Please call the main number to the clinic [phone] and follow the prompts.  For any non-urgent questions, you may also contact your provider using MyChart. We now offer e-Visits for anyone 1 and older to request care online for non-urgent symptoms. For details visit [email or web address].   Also download the MyChart app! Go to the app store, search "MyChart", open the app, select [place], and log in with your MyChart username and password.

## 2023-12-28 NOTE — Progress Notes (Signed)
 Patient is doing fine, no new questions or concerns for the doctor today.

## 2023-12-28 NOTE — Assessment & Plan Note (Signed)
 Stable continue  potassium chloride daily,

## 2023-12-28 NOTE — Assessment & Plan Note (Signed)
 Continue albuterol inhaler PRN, Duonebs.  follow up with pulmonology Dr. Meredeth Ide.  Continue Singulair, mucinex.

## 2023-12-28 NOTE — Assessment & Plan Note (Signed)
 Continue Elqiuis to 2.5mg  BID. He is on Plavix  75mg  daily.

## 2023-12-28 NOTE — Assessment & Plan Note (Signed)
 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  Left upper lobe adenocarcinoma, No enough tissue for NGS, will send off liquid biopsy- send off liquid biopsy.  S/p concurrent chemotherapy radiation. -carboplatin  AUC 2 and taxol  45mg /m2  On Durvalumab  maintenance.   Clinically he is doing well today Labs are reviewed and discussed with patient. Proceed with Durvalumab  maintenance.

## 2023-12-28 NOTE — Assessment & Plan Note (Signed)
 Immunotherapy plan as planned.

## 2023-12-28 NOTE — Progress Notes (Signed)
 Hematology/Oncology Progress note [phone] Fax:(336) [phone]      CHIEF COMPLAINTS/PURPOSE OF CONSULTATION:  Stage III Lung cancer  ASSESSMENT & PLAN:   Cancer Staging  Adenocarcinoma of lung [facility]) Staging form: Lung, AJCC 8th Edition - Clinical stage from 03/22/2023: cT1, cN3, cM0 - Signed by [name], MD on 03/22/2023   Adenocarcinoma of lung (HCC) Clinically he has Stage III left lung cancer He has right paratracheal lymph node hypermetabolic activity, although Station 7 lymph node showed atypical cell, no definite malignancy.  Left upper lobe adenocarcinoma, No enough tissue for NGS, will send off liquid biopsy- send off liquid biopsy.  S/p concurrent chemotherapy radiation. -carboplatin  AUC 2 and taxol  45mg /m2  On Durvalumab  maintenance.   Clinically he is doing well today Labs are reviewed and discussed with patient. Proceed with Durvalumab  maintenance.      Encounter for antineoplastic immunotherapy Immunotherapy plan as planned.   COPD (chronic obstructive pulmonary disease) (HCC) Continue albuterol  inhaler PRN, Duonebs.  follow up with pulmonology Dr. Jamal Mays.  Continue Singulair , mucinex .   Hypokalemia Stable continue  potassium chloride  10meq daily,   Pulmonary embolism (HCC) Continue Elqiuis to 2.5mg  BID. He is on Plavix  75mg  daily.      Orders Placed This Encounter  Procedures   CBC with Differential (Cancer Center Only)    Standing Status:   Future    Expected Date:   01/11/2024    Expiration Date:   01/10/2025   CMP (Cancer Center only)    Standing Status:   Future    Expected Date:   01/11/2024    Expiration Date:   01/10/2025   T4    Standing Status:   Future    Expected Date:   01/11/2024    Expiration Date:   01/10/2025   TSH    Standing Status:   Future    Expected Date:   01/11/2024    Expiration Date:   01/10/2025   CBC with Differential (Cancer Center Only)    Standing Status:   Future    Expected Date:   01/25/2024     Expiration Date:   01/24/2025   CMP (Cancer Center only)    Standing Status:   Future    Expected Date:   01/25/2024    Expiration Date:   01/24/2025   T4    Standing Status:   Future    Expected Date:   01/25/2024    Expiration Date:   01/24/2025   TSH    Standing Status:   Future    Expected Date:   01/25/2024    Expiration Date:   01/24/2025   CBC with Differential (Cancer Center Only)    Standing Status:   Future    Expected Date:   02/08/2024    Expiration Date:   02/07/2025   CMP (Cancer Center only)    Standing Status:   Future    Expected Date:   02/08/2024    Expiration Date:   02/07/2025   T4    Standing Status:   Future    Expected Date:   02/08/2024    Expiration Date:   02/07/2025   TSH    Standing Status:   Future    Expected Date:   02/08/2024    Expiration Date:   02/07/2025   Follow up  2 weeks Lab MD All questions were answered. The patient knows to call the clinic with any problems, questions or concerns.  [name], MD, PhD [facility] Hematology Oncology  12/28/2023    HISTORY OF PRESENTING ILLNESS:  [name] 63 y.o. male presents to establish care for lung mass I have reviewed his chart and materials related to his cancer extensively and collaborated history with the patient. Summary of oncologic history is as follows:  12/08/2021 CT chest with contrast showed irregular solid pulmonary nodule of right upper lobe, increased in size.  Bibasilar consolidation.  Mildly enlarged right lower paratracheal lymph node.  Atherosclerosis. 01/04/2022 PET scan showed 10 mm irregular nodule in the lateral right upper lobe, suspicious for primary bronchogenic neoplasm. 2 small right paratracheal nodes, indeterminate and favored to be reactive.  Attention on follow-up.  Patient was seen by radiation oncology Dr. Jacalyn Martin.  Patient was offered SBRT to the right upper lobe nodule/presumed non-small cell lung cancer stage I.  Patient lives in a group home.  He reports chronic cough.  Some  shortness of breath with exertion. He is a current everyday smoker.  Few cigarettes per day.  He denies any unintentional weight loss, night sweats, fever or chills. Patient is on aspirin  and Plavix    INTERVAL HISTORY [name] is a 63 y.o. male who has above history reviewed by me today presents for follow up visit for lung cancer.  Oncology History  Adenocarcinoma of lung (HCC)  12/29/2022 Imaging   CT chest with contrast showed Stable radiation changes involving the right upper lobe with a small residual treated nodule.  Decrease in size.  Interval enlargement of the left upper lobe pulmonary nodule.  Stable mediastinal nodes stable advanced emphysematous changes and areas of pulmonary scarring.  Stable bilateral adrenal gland adenomas.  Emphysema   01/30/2023 Imaging   PET 1. Recurrent bronchogenic carcinoma in the left upper lobe with new and increasingly hypermetabolic mediastinal lymph nodes. No evidence of distant metastatic disease. 2. Possible mild prostate hypermetabolism. Consider laboratory correlation. 3. Liver is mildly heterogeneous, raising suspicion for steatosis. 4. Cholelithiasis. 5. Right adrenal adenoma.  Left adrenal myelolipoma. 6. Aortic atherosclerosis (ICD10-I70.0). Coronary artery calcification. 7. Enlarged pulmonic trunk, indicative of pulmonary arterial hypertension.   03/15/2023 Initial Diagnosis   Adenocarcinoma of left lung   Bronchoscopy showed  Lung, biopsy, Left upper lobe - NON-SMALL CELL CARCINOMA, FAVOR ADENOCARCINOMA.  1. Bronchial Lavage, Left upper lobe - POSITIVE FOR MALIGNANCY. - NON-SMALL CELL CARCINOMA, FAVOR ADENOCARCINOMA. - SEE NOTE. 2. Bronchial Brushing, Left upper lobe - POSITIVE FOR MALIGNANCY. - NON-SMALL CELL CARCINOMA, FAVOR ADENOCARCINOMA. 3. Bronchus, biopsy, left upper lobe - SEE [id number] 4. Fine Needle Aspiration, left upper lobe - POSITIVE FOR MALIGNANCY. - NON-SMALL CELL CARCINOMA, FAVOR ADENOCARCINOMA. 5.  Fine Needle Aspiration, station 7 lymph node - ATYPICAL. - BACKGROUND LYMPHOID TISSUE IS PRESENT CONSISTENT WITH LYMPH NODE SAMPLING.    03/15/2023 Procedure   Fiberoptic bronchoscopy with airway inspection and BAL Procedure findings:   Bronchoscope was inserted via ETT  without difficulty.  Posterior oropharynx, epiglottis, arytenoids, false cords and vocal cords were not visualized as these were bypassed by endotracheal tube. The distal trachea was normal in circumference and appearance without mucosal, cartilaginous or branching abnormalities.  The main carina was mildly splayed . All right and left lobar airways were visualized to the Subsegmental level.  Sub- sub segmental carinae were identified in all the distal airways.   Secretions were visible in the following airways and appeared to be clear.  The mucosa was : friable at LEFT UPPER LOBE   Airways were notable for:        exophytic lesions :n  extrinsic compression in the following distributions: n.       Friable mucosa: y       Teacher, music /pigmentation: n   MUCUS PLUGGING WAS SEVERE ON LEFT SIDE AND COMPLETE OBSTRUCTED MULTIPLE AIRWAYS.  UNABLE TO NAVIGATE THROUGH TRACHEOBRONCHIAL TREE DUE TO SEVERE MUCUS PLUGGING. THERAPEUTIC ASPIRATION WAS PERFORMED X 7 AND WAS DONE BILATERALLY ALTHOUGH WORSE ON LEFT.  BAL WAS PERFORMED AT LEFT UPPER LOBE X 2.     Endobronchial ultrasound assisted hilar and mediastinal lymph node biopsies procedure findings: The fiberoptic bronchoscope was removed and the EBUS scope was introduced. Examination began to evaluate for pathologically enlarged lymph nodes starting on the RIGHT  side progressing to the LEFT side.  All lymph node biopsies performed with 21G  needle. Lymph node biopsies were sent in cytolite for all stations.   STATION 10R - 6mm not biopsied STATION 7 - 1.2cm biopsied 3 times STATION 10L - 5mm not biopsied  STATION 4L - 6mm not biopsied    03/22/2023 Cancer Staging    Staging form: Lung, AJCC 8th Edition - Clinical stage from 03/22/2023: cT1, cN3, cM0 - Signed by [name], MD on 03/22/2023 Stage prefix: Initial diagnosis   04/20/2023 - 06/08/2023 Chemotherapy   Patient is on Treatment Plan : LUNG Carboplatin  + Paclitaxel  + XRT q7d     09/01/2023 -  Chemotherapy   Patient is on Treatment Plan : LUNG Durvalumab  (10) q14d     10/15/2023 - 10/24/2023 Hospital Admission   Hospitalized due to Influenza A, COPD exacerbation, acute respiratory failure.    11/03/2023 Imaging   CT chest abdomen pelvis w contrast showed CHEST:   1. Stable LEFT upper lobe peribronchial lesion. 2. No evidence of metastatic adenopathy. 3. Stable small RIGHT paratracheal node. 4. Slight improvement in atelectasis in the RIGHT middle lobe.   PELVIS:   1. No evidence of metastatic disease in the abdomen pelvis. 2. Stable small hypodensity in the spleen and liver. 3. Stable benign RIGHT adrenal adenoma and and LEFT adrenal angiomyolipoma.      He reports feel well Chronic cough and SOB, he feels breathing status is at baseline today he is on Eliquis  2.5mg  BID and Plavix .     MEDICAL HISTORY:  Past Medical History:  Diagnosis Date   Acute on chronic respiratory failure with hypoxia (HCC)    Asthma    Coagulation disorder (HCC)    COPD (chronic obstructive pulmonary disease) (HCC)    Depression    History of hiatal hernia    Hypertension    Hypokalemia    Leukocytosis    Lung mass    Normocytic anemia 07/13/2023   Pneumonia    Schizophrenia (HCC)    Shortness of breath dyspnea    Stroke [facility])    Umbilical hernia    Ventral hernia     SURGICAL HISTORY: Past Surgical History:  Procedure Laterality Date   COLONOSCOPY WITH PROPOFOL  N/A 09/21/2021   Procedure: COLONOSCOPY WITH PROPOFOL ;  Surgeon: Shane Darling, MD;  Location: [facility] ENDOSCOPY;  Service: Endoscopy;  Laterality: N/A;   HERNIA REPAIR     INSERTION OF MESH N/A 12/18/2014   Procedure: INSERTION OF  MESH;  Surgeon: Claudia Cuff, MD;  Location: [facility] ORS;  Service: General;  Laterality: N/A;   SUPRA-UMBILICAL HERNIA  12/18/2014   Procedure: SUPRA-UMBILICAL HERNIA;  Surgeon: Claudia Cuff, MD;  Location: [facility] ORS;  Service: General;;   UMBILICAL HERNIA REPAIR N/A 12/18/2014   Procedure: HERNIA REPAIR UMBILICAL ADULT;  Surgeon: Claudia Cuff, MD;  Location: [facility] ORS;  Service: General;  Laterality: N/A;   VIDEO BRONCHOSCOPY WITH ENDOBRONCHIAL ULTRASOUND N/A 03/15/2023   Procedure: VIDEO BRONCHOSCOPY WITH ENDOBRONCHIAL ULTRASOUND;  Surgeon: Erskin Hearing, MD;  Location: [facility] ORS;  Service: Thoracic;  Laterality: N/A;    SOCIAL HISTORY: Social History   Socioeconomic History   Marital status: Widowed    Spouse name: Not on file   Number of children: Not on file   Years of education: Not on file   Highest education level: Not on file  Occupational History   Not on file  Tobacco Use   Smoking status: Former    Current packs/day: 0.15    Average packs/day: 0.2 packs/day for 45.0 years (6.8 ttl pk-yrs)    Types: Cigarettes   Smokeless tobacco: Never  Vaping Use   Vaping status: Never Used  Substance and Sexual Activity   Alcohol use: Not Currently    Alcohol/week: 75.0 standard drinks of alcohol    Types: 75 Cans of beer per week   Drug use: Not Currently    Types: Marijuana, "Crack" cocaine    Comment: none in 15 yrs   Sexual activity: Not Currently  Other Topics Concern   Not on file  Social History Narrative   Not on file   Social Drivers of Health   Financial Resource Strain: Medium Risk (08/18/2023)   Received [facility]   Overall Financial Resource Strain (CARDIA)    Difficulty of Paying Living Expenses: Somewhat hard  Food Insecurity: No Food Insecurity (10/17/2023)   Hunger Vital Sign    Worried About Running Out of Food in the Last Year: Never true    Ran Out of Food in the Last Year: Never true  Transportation Needs: No  Transportation Needs (10/17/2023)   PRAPARE - Administrator, Civil Service (Medical): No    Lack of Transportation (Non-Medical): No  Physical Activity: Not on file  Stress: Not on file  Social Connections: Not on file  Intimate Partner Violence: Not At Risk (10/17/2023)   Humiliation, Afraid, Rape, and Kick questionnaire    Fear of Current or Ex-Partner: No    Emotionally Abused: No    Physically Abused: No    Sexually Abused: No    FAMILY HISTORY: Family History  Problem Relation Age of Onset   Asthma Mother    Hypertension Father     ALLERGIES:  has no known allergies.  MEDICATIONS:  Current Outpatient Medications  Medication Sig Dispense Refill   albuterol  (VENTOLIN  HFA) 108 (90 Base) MCG/ACT inhaler Inhale 1-2 puffs into the lungs every 6 (six) hours as needed for wheezing or shortness of breath. 8 g 0   apixaban  (ELIQUIS ) 2.5 MG TABS tablet Take 1 tablet (2.5 mg total) by mouth 2 (two) times daily. 60 tablet 3   clopidogrel  (PLAVIX ) 75 MG tablet Take 1 tablet (75 mg total) by mouth daily. 30 tablet 0   famotidine  (PEPCID ) 20 MG tablet Take 1 tablet (20 mg total) by mouth daily. 30 tablet 0   FLUoxetine  (PROZAC ) 20 MG capsule Take 1 capsule (20 mg total) by mouth daily. 30 capsule 3   Fluticasone -Umeclidin-Vilant (TRELEGY ELLIPTA ) 100-62.5-25 MCG/ACT AEPB Inhale 1 puff into the lungs daily. 28 each 1   guaiFENesin  (MUCINEX ) 600 MG 12 hr tablet Take 1 tablet (600 mg total) by mouth 2 (two) times daily. (Patient not taking: Reported on 12/14/2023) 30 tablet 1   ipratropium-albuterol  (DUONEB) 0.5-2.5 (3)  MG/3ML SOLN Take 3 mLs by nebulization 3 (three) times daily. 360 mL 1   mirtazapine  (REMERON ) 7.5 MG tablet Take 1 tablet (7.5 mg total) by mouth at bedtime. 30 tablet 5   montelukast  (SINGULAIR ) 10 MG tablet Take 1 tablet (10 mg total) by mouth at bedtime. 30 tablet 1   potassium chloride  (KLOR-CON  M) 10 MEQ tablet Take 1 tablet (10 mEq total) by mouth daily. 30  tablet 1   pravastatin  (PRAVACHOL ) 20 MG tablet Take 1 tablet (20 mg total) by mouth daily at 6 PM. 30 tablet 0   QUEtiapine  (SEROQUEL ) 300 MG tablet Take 2 tablets (600 mg total) by mouth at bedtime. 60 tablet 0   senna-docusate (SENOKOT-S) 8.6-50 MG tablet Take 1 tablet by mouth daily after breakfast. 30 tablet 0   traZODone  (DESYREL ) 100 MG tablet Take 2 tablets (200 mg total) by mouth at bedtime as needed for sleep. 30 tablet 0   No current facility-administered medications for this visit.   Facility-Administered Medications Ordered in Other Visits  Medication Dose Route Frequency Provider Last Rate Last Admin   0.9 %  sodium chloride  infusion   Intravenous Continuous [name], MD   Stopped at 12/28/23 1102    Review of Systems  Constitutional:  Negative for appetite change, chills, fatigue, fever and unexpected weight change.  HENT:   Negative for hearing loss and voice change.   Eyes:  Negative for eye problems and icterus.  Respiratory:  Negative for chest tightness, cough and hemoptysis.   Cardiovascular:  Negative for chest pain and leg swelling.  Gastrointestinal:  Negative for abdominal distention and abdominal pain.  Endocrine: Negative for hot flashes.  Genitourinary:  Negative for difficulty urinating, dysuria and frequency.   Musculoskeletal:  Negative for arthralgias.  Skin:  Negative for itching and rash.  Neurological:  Negative for light-headedness and numbness.  Hematological:  Negative for adenopathy. Does not bruise/bleed easily.  Psychiatric/Behavioral:  Negative for confusion.      PHYSICAL EXAMINATION: ECOG PERFORMANCE STATUS: 1 - Symptomatic but completely ambulatory  Vitals:   12/28/23 0851  BP: 120/86  Pulse: 94  Resp: 18  Temp: (!) 97.3 F (36.3 C)  SpO2: 99%   Filed Weights   12/28/23 0851  Weight: 215 lb (97.5 kg)    Physical Exam Constitutional:      General: He is not in acute distress.    Appearance: He is not diaphoretic.  HENT:      Head: Normocephalic and atraumatic.  Eyes:     General: No scleral icterus. Cardiovascular:     Rate and Rhythm: Normal rate and regular rhythm.  Pulmonary:     Effort: Pulmonary effort is normal. No respiratory distress.     Breath sounds: No wheezing.     Comments: Decreased breath sound bilaterally, poor air entry Abdominal:     General: There is no distension.     Palpations: Abdomen is soft.     Tenderness: There is no abdominal tenderness.  Musculoskeletal:        General: Normal range of motion.     Cervical back: Normal range of motion and neck supple.  Skin:    General: Skin is warm and dry.     Findings: No erythema.  Neurological:     Mental Status: He is alert and oriented to person, place, and time. Mental status is at baseline.     Motor: No abnormal muscle tone.  Psychiatric:        Mood and  Affect: Affect normal.     Comments: Flat      LABORATORY DATA:  I have reviewed the data as listed    Latest Ref Rng & Units 12/28/2023    8:39 AM 12/14/2023    8:45 AM 12/01/2023    9:03 AM  CBC  WBC 4.0 - 10.5 K/uL 4.3  3.7  3.4   Hemoglobin 13.0 - 17.0 g/dL 40.9  81.1  91.4   Hematocrit 39.0 - 52.0 % 37.4  35.4  35.4   Platelets 150 - 400 K/uL 195  177  182       Latest Ref Rng & Units 12/28/2023    8:40 AM 12/14/2023    8:46 AM 12/01/2023    9:04 AM  CMP  Glucose 70 - 99 mg/dL 782  956  213   BUN 8 - 23 mg/dL 7  7  8    Creatinine 0.61 - 1.24 mg/dL 0.86  5.78  4.69   Sodium 135 - 145 mmol/L 139  138  136   Potassium 3.5 - 5.1 mmol/L 3.9  3.9  3.7   Chloride 98 - 111 mmol/L 107  106  105   CO2 22 - 32 mmol/L 24  24  25    Calcium 8.9 - 10.3 mg/dL 9.2  9.1  9.1   Total Protein 6.5 - 8.1 g/dL 7.4  6.8  7.3   Total Bilirubin 0.0 - 1.2 mg/dL 0.5  0.5  0.4   Alkaline Phos 38 - 126 U/L 73  62  63   AST 15 - 41 U/L 17  12  17    ALT 0 - 44 U/L 12  9  12       RADIOGRAPHIC STUDIES: I have personally reviewed the radiological images as listed and agreed with the  findings in the report. No results found.

## 2023-12-29 LAB — T4: T4, Total: 4.7 ug/dL (ref 4.5–12.0)

## 2024-01-11 ENCOUNTER — Encounter: Payer: Self-pay | Admitting: Oncology

## 2024-01-11 ENCOUNTER — Inpatient Hospital Stay (HOSPITAL_BASED_OUTPATIENT_CLINIC_OR_DEPARTMENT_OTHER): Admitting: Oncology

## 2024-01-11 ENCOUNTER — Inpatient Hospital Stay

## 2024-01-11 ENCOUNTER — Inpatient Hospital Stay: Attending: Oncology

## 2024-01-11 VITALS — BP 147/87 | HR 92 | Temp 98.3°F | Resp 18 | Wt 215.6 lb

## 2024-01-11 VITALS — BP 137/92 | HR 83 | Temp 97.2°F | Resp 18

## 2024-01-11 DIAGNOSIS — Z87891 Personal history of nicotine dependence: Secondary | ICD-10-CM | POA: Diagnosis not present

## 2024-01-11 DIAGNOSIS — C3492 Malignant neoplasm of unspecified part of left bronchus or lung: Secondary | ICD-10-CM

## 2024-01-11 DIAGNOSIS — Z5112 Encounter for antineoplastic immunotherapy: Secondary | ICD-10-CM | POA: Diagnosis not present

## 2024-01-11 DIAGNOSIS — I2510 Atherosclerotic heart disease of native coronary artery without angina pectoris: Secondary | ICD-10-CM | POA: Diagnosis not present

## 2024-01-11 DIAGNOSIS — D3501 Benign neoplasm of right adrenal gland: Secondary | ICD-10-CM | POA: Diagnosis not present

## 2024-01-11 DIAGNOSIS — Z7902 Long term (current) use of antithrombotics/antiplatelets: Secondary | ICD-10-CM | POA: Insufficient documentation

## 2024-01-11 DIAGNOSIS — Z79899 Other long term (current) drug therapy: Secondary | ICD-10-CM | POA: Diagnosis not present

## 2024-01-11 DIAGNOSIS — K8020 Calculus of gallbladder without cholecystitis without obstruction: Secondary | ICD-10-CM | POA: Diagnosis not present

## 2024-01-11 DIAGNOSIS — Z7901 Long term (current) use of anticoagulants: Secondary | ICD-10-CM | POA: Insufficient documentation

## 2024-01-11 DIAGNOSIS — I700 Atherosclerosis of aorta: Secondary | ICD-10-CM | POA: Diagnosis not present

## 2024-01-11 DIAGNOSIS — E876 Hypokalemia: Secondary | ICD-10-CM | POA: Diagnosis not present

## 2024-01-11 DIAGNOSIS — J449 Chronic obstructive pulmonary disease, unspecified: Secondary | ICD-10-CM | POA: Diagnosis not present

## 2024-01-11 DIAGNOSIS — C3412 Malignant neoplasm of upper lobe, left bronchus or lung: Secondary | ICD-10-CM | POA: Insufficient documentation

## 2024-01-11 DIAGNOSIS — F209 Schizophrenia, unspecified: Secondary | ICD-10-CM | POA: Insufficient documentation

## 2024-01-11 DIAGNOSIS — I10 Essential (primary) hypertension: Secondary | ICD-10-CM | POA: Diagnosis not present

## 2024-01-11 DIAGNOSIS — I2699 Other pulmonary embolism without acute cor pulmonale: Secondary | ICD-10-CM | POA: Insufficient documentation

## 2024-01-11 DIAGNOSIS — J4489 Other specified chronic obstructive pulmonary disease: Secondary | ICD-10-CM | POA: Diagnosis not present

## 2024-01-11 DIAGNOSIS — Z8673 Personal history of transient ischemic attack (TIA), and cerebral infarction without residual deficits: Secondary | ICD-10-CM | POA: Diagnosis not present

## 2024-01-11 LAB — CMP (CANCER CENTER ONLY)
ALT: 12 U/L (ref 0–44)
AST: 17 U/L (ref 15–41)
Albumin: 3.9 g/dL (ref 3.5–5.0)
Alkaline Phosphatase: 71 U/L (ref 38–126)
Anion gap: 7 (ref 5–15)
BUN: 9 mg/dL (ref 8–23)
CO2: 25 mmol/L (ref 22–32)
Calcium: 8.8 mg/dL — ABNORMAL LOW (ref 8.9–10.3)
Chloride: 107 mmol/L (ref 98–111)
Creatinine: 0.7 mg/dL (ref 0.61–1.24)
GFR, Estimated: >60 mL/min (ref >60)
Glucose, Bld: 101 mg/dL — ABNORMAL HIGH (ref 70–99)
Potassium: 3.8 mmol/L (ref 3.5–5.1)
Sodium: 139 mmol/L (ref 135–145)
Total Bilirubin: 0.5 mg/dL (ref 0.0–1.2)
Total Protein: 7.1 g/dL (ref 6.5–8.1)

## 2024-01-11 LAB — CBC WITH DIFFERENTIAL (CANCER CENTER ONLY)
Abs Immature Granulocytes: 0.01 10*3/uL (ref 0.00–0.07)
Basophils Absolute: 0 10*3/uL (ref 0.0–0.1)
Basophils Relative: 1 %
Eosinophils Absolute: 0.1 10*3/uL (ref 0.0–0.5)
Eosinophils Relative: 2 %
HCT: 36.3 % — ABNORMAL LOW (ref 39.0–52.0)
Hemoglobin: 11.7 g/dL — ABNORMAL LOW (ref 13.0–17.0)
Immature Granulocytes: 0 %
Lymphocytes Relative: 30 %
Lymphs Abs: 1.3 10*3/uL (ref 0.7–4.0)
MCH: 29.2 pg (ref 26.0–34.0)
MCHC: 32.2 g/dL (ref 30.0–36.0)
MCV: 90.5 fL (ref 80.0–100.0)
Monocytes Absolute: 0.5 10*3/uL (ref 0.1–1.0)
Monocytes Relative: 11 %
Neutro Abs: 2.4 10*3/uL (ref 1.7–7.7)
Neutrophils Relative %: 56 %
Platelet Count: 175 10*3/uL (ref 150–400)
RBC: 4.01 MIL/uL — ABNORMAL LOW (ref 4.22–5.81)
RDW: 15 % (ref 11.5–15.5)
WBC Count: 4.2 10*3/uL (ref 4.0–10.5)
nRBC: 0 % (ref 0.0–0.2)

## 2024-01-11 LAB — TSH: TSH: 0.475 u[IU]/mL (ref 0.350–4.500)

## 2024-01-11 MED ORDER — SODIUM CHLORIDE 0.9 % IV SOLN
10.0000 mg/kg | Freq: Once | INTRAVENOUS | Status: AC
  Administered 2024-01-11: 1000 mg via INTRAVENOUS
  Filled 2024-01-11: qty 20

## 2024-01-11 MED ORDER — SODIUM CHLORIDE 0.9 % IV SOLN
INTRAVENOUS | Status: DC
  Filled 2024-01-11: qty 250

## 2024-01-11 NOTE — Assessment & Plan Note (Signed)
 Stable continue  potassium chloride daily,

## 2024-01-11 NOTE — Assessment & Plan Note (Signed)
 Immunotherapy plan as planned.

## 2024-01-11 NOTE — Progress Notes (Signed)
 Hematology/Oncology Progress note [phone] [phone]      CHIEF COMPLAINTS/PURPOSE OF CONSULTATION:  Stage III Lung cancer  ASSESSMENT & PLAN:   Cancer Staging  Adenocarcinoma of lung [facility]) Staging form: Lung, AJCC 8th Edition - Clinical stage from 03/22/2023: cT1, cN3, cM0 - Signed by [name], MD on 03/22/2023   Hypokalemia Stable continue  potassium chloride  daily,   Pulmonary embolism (HCC) Continue Elqiuis to 2.5mg  BID. [name] is on Plavix  75mg  daily.    Adenocarcinoma of lung (HCC) Clinically [name] has Stage III left lung cancer [name] has right paratracheal lymph node hypermetabolic activity, although Station 7 lymph node showed atypical cell, no definite malignancy.  Left upper lobe adenocarcinoma, No enough tissue for NGS, will send off liquid biopsy- send off liquid biopsy.  S/p concurrent chemotherapy radiation. -carboplatin  AUC 2 and taxol  45mg /m2  On Durvalumab  maintenance.   Clinically [name] is doing well today Labs are reviewed and discussed with patient. Proceed with Durvalumab  maintenance.   COPD (chronic obstructive pulmonary disease) (HCC) Continue albuterol  inhaler PRN, Duonebs.  follow up with pulmonology Dr. Jamal Mays.  Continue Singulair , mucinex .   Encounter for antineoplastic immunotherapy Immunotherapy plan as planned.   Orders Placed This Encounter  Procedures   CBC with Differential (Cancer Center Only)    Standing Status:   Future    Expected Date:   02/22/2024    Expiration Date:   02/21/2025   CMP (Cancer Center only)    Standing Status:   Future    Expected Date:   02/22/2024    Expiration Date:   02/21/2025   T4    Standing Status:   Future    Expected Date:   02/22/2024    Expiration Date:   02/21/2025   TSH    Standing Status:   Future    Expected Date:   02/22/2024    Expiration Date:   02/21/2025   Follow up  2 weeks Lab MD All questions were answered. The patient knows to call the clinic with any problems,  questions or concerns.  [name], MD, PhD [facility] Hematology Oncology 01/11/2024    HISTORY OF PRESENTING ILLNESS:  [name] 63 y.o. male presents for follow up of lung cancer.  I have reviewed his chart and materials related to his cancer extensively and collaborated history with the patient. Summary of oncologic history is as follows:  12/08/2021 CT chest with contrast showed irregular solid pulmonary nodule of right upper lobe, increased in size.  Bibasilar consolidation.  Mildly enlarged right lower paratracheal lymph node.  Atherosclerosis. 01/04/2022 PET scan showed 10 mm irregular nodule in the lateral right upper lobe, suspicious for primary bronchogenic neoplasm. 2 small right paratracheal nodes, indeterminate and favored to be reactive.  Attention on follow-up.  Patient was seen by radiation oncology Dr. Jacalyn Martin.  Patient was offered SBRT to the right upper lobe nodule/presumed non-small cell lung cancer stage I.  Patient lives in a group home.  [name] reports chronic cough.  Some shortness of breath with exertion. [name] is a current everyday smoker.  Few cigarettes per day.  [name] denies any unintentional weight loss, night sweats, fever or chills. Patient is on aspirin  and Plavix    INTERVAL HISTORY [name] is a 63 y.o. male who has above history reviewed by me today presents for follow up visit for lung cancer.  Oncology History  Adenocarcinoma of lung (HCC)  12/29/2022 Imaging   CT chest with contrast showed Stable radiation changes involving  the right upper lobe with a small residual treated nodule.  Decrease in size.  Interval enlargement of the left upper lobe pulmonary nodule.  Stable mediastinal nodes stable advanced emphysematous changes and areas of pulmonary scarring.  Stable bilateral adrenal gland adenomas.  Emphysema   01/30/2023 Imaging   PET 1. Recurrent bronchogenic carcinoma in the left upper lobe with new and increasingly hypermetabolic mediastinal lymph  nodes. No evidence of distant metastatic disease. 2. Possible mild prostate hypermetabolism. Consider laboratory correlation. 3. Liver is mildly heterogeneous, raising suspicion for steatosis. 4. Cholelithiasis. 5. Right adrenal adenoma.  Left adrenal myelolipoma. 6. Aortic atherosclerosis (ICD10-I70.0). Coronary artery calcification. 7. Enlarged pulmonic trunk, indicative of pulmonary arterial hypertension.   03/15/2023 Initial Diagnosis   Adenocarcinoma of left lung   Bronchoscopy showed  Lung, biopsy, Left upper lobe - NON-SMALL CELL CARCINOMA, FAVOR ADENOCARCINOMA.  1. Bronchial Lavage, Left upper lobe - POSITIVE FOR MALIGNANCY. - NON-SMALL CELL CARCINOMA, FAVOR ADENOCARCINOMA. - SEE NOTE. 2. Bronchial Brushing, Left upper lobe - POSITIVE FOR MALIGNANCY. - NON-SMALL CELL CARCINOMA, FAVOR ADENOCARCINOMA. 3. Bronchus, biopsy, left upper lobe - SEE [id number] 4. Fine Needle Aspiration, left upper lobe - POSITIVE FOR MALIGNANCY. - NON-SMALL CELL CARCINOMA, FAVOR ADENOCARCINOMA. 5. Fine Needle Aspiration, station 7 lymph node - ATYPICAL. - BACKGROUND LYMPHOID TISSUE IS PRESENT CONSISTENT WITH LYMPH NODE SAMPLING.    03/15/2023 Procedure   Fiberoptic bronchoscopy with airway inspection and BAL Procedure findings:   Bronchoscope was inserted via ETT  without difficulty.  Posterior oropharynx, epiglottis, arytenoids, false cords and vocal cords were not visualized as these were bypassed by endotracheal tube. The distal trachea was normal in circumference and appearance without mucosal, cartilaginous or branching abnormalities.  The main carina was mildly splayed . All right and left lobar airways were visualized to the Subsegmental level.  Sub- sub segmental carinae were identified in all the distal airways.   Secretions were visible in the following airways and appeared to be clear.  The mucosa was : friable at LEFT UPPER LOBE   Airways were notable for:        exophytic lesions  :n       extrinsic compression in the following distributions: n.       Friable mucosa: y       Teacher, music /pigmentation: n   MUCUS PLUGGING WAS SEVERE ON LEFT SIDE AND COMPLETE OBSTRUCTED MULTIPLE AIRWAYS.  UNABLE TO NAVIGATE THROUGH TRACHEOBRONCHIAL TREE DUE TO SEVERE MUCUS PLUGGING. THERAPEUTIC ASPIRATION WAS PERFORMED X 7 AND WAS DONE BILATERALLY ALTHOUGH WORSE ON LEFT.  BAL WAS PERFORMED AT LEFT UPPER LOBE X 2.     Endobronchial ultrasound assisted hilar and mediastinal lymph node biopsies procedure findings: The fiberoptic bronchoscope was removed and the EBUS scope was introduced. Examination began to evaluate for pathologically enlarged lymph nodes starting on the RIGHT  side progressing to the LEFT side.  All lymph node biopsies performed with 21G  needle. Lymph node biopsies were sent in cytolite for all stations.   STATION 10R - 6mm not biopsied STATION 7 - 1.2cm biopsied 3 times STATION 10L - 5mm not biopsied  STATION 4L - 6mm not biopsied    03/22/2023 Cancer Staging   Staging form: Lung, AJCC 8th Edition - Clinical stage from 03/22/2023: cT1, cN3, cM0 - Signed by [name], MD on 03/22/2023 Stage prefix: Initial diagnosis   04/20/2023 - 06/08/2023 Chemotherapy   Patient is on Treatment Plan : LUNG Carboplatin  + Paclitaxel  + XRT q7d     09/01/2023 -  Chemotherapy   Patient is on Treatment Plan : LUNG Durvalumab  (10) q14d     10/15/2023 - 10/24/2023 Hospital Admission   Hospitalized due to Influenza A, COPD exacerbation, acute respiratory failure.    11/03/2023 Imaging   CT chest abdomen pelvis w contrast showed CHEST:   1. Stable LEFT upper lobe peribronchial lesion. 2. No evidence of metastatic adenopathy. 3. Stable small RIGHT paratracheal node. 4. Slight improvement in atelectasis in the RIGHT middle lobe.   PELVIS:   1. No evidence of metastatic disease in the abdomen pelvis. 2. Stable small hypodensity in the spleen and liver. 3. Stable benign RIGHT  adrenal adenoma and and LEFT adrenal angiomyolipoma.      [name] reports feel well Chronic cough and SOB, [name] feels breathing status is at baseline today [name] is on Eliquis  2.5mg  BID and Plavix .     MEDICAL HISTORY:  Past Medical History:  Diagnosis Date   Acute on chronic respiratory failure with hypoxia (HCC)    Asthma    Coagulation disorder (HCC)    COPD (chronic obstructive pulmonary disease) (HCC)    Depression    History of hiatal hernia    Hypertension    Hypokalemia    Leukocytosis    Lung mass    Normocytic anemia 07/13/2023   Pneumonia    Schizophrenia (HCC)    Shortness of breath dyspnea    Stroke [facility])    Umbilical hernia    Ventral hernia     SURGICAL HISTORY: Past Surgical History:  Procedure Laterality Date   COLONOSCOPY WITH PROPOFOL  N/A 09/21/2021   Procedure: COLONOSCOPY WITH PROPOFOL ;  Surgeon: Shane Darling, MD;  Location: [facility] ENDOSCOPY;  Service: Endoscopy;  Laterality: N/A;   HERNIA REPAIR     INSERTION OF MESH N/A 12/18/2014   Procedure: INSERTION OF MESH;  Surgeon: Claudia Cuff, MD;  Location: [facility] ORS;  Service: General;  Laterality: N/A;   SUPRA-UMBILICAL HERNIA  12/18/2014   Procedure: SUPRA-UMBILICAL HERNIA;  Surgeon: Claudia Cuff, MD;  Location: [facility] ORS;  Service: General;;   UMBILICAL HERNIA REPAIR N/A 12/18/2014   Procedure: HERNIA REPAIR UMBILICAL ADULT;  Surgeon: Claudia Cuff, MD;  Location: [facility] ORS;  Service: General;  Laterality: N/A;   VIDEO BRONCHOSCOPY WITH ENDOBRONCHIAL ULTRASOUND N/A 03/15/2023   Procedure: VIDEO BRONCHOSCOPY WITH ENDOBRONCHIAL ULTRASOUND;  Surgeon: Erskin Hearing, MD;  Location: [facility] ORS;  Service: Thoracic;  Laterality: N/A;    SOCIAL HISTORY: Social History   Socioeconomic History   Marital status: Widowed    Spouse name: Not on file   Number of children: Not on file   Years of education: Not on file   Highest education level: Not on file  Occupational History   Not on file  Tobacco Use    Smoking status: Former    Current packs/day: 0.15    Average packs/day: 0.2 packs/day for 45.0 years (6.8 ttl pk-yrs)    Types: Cigarettes   Smokeless tobacco: Never  Vaping Use   Vaping status: Never Used  Substance and Sexual Activity   Alcohol use: Not Currently    Alcohol/week: 75.0 standard drinks of alcohol    Types: 75 Cans of beer per week   Drug use: Not Currently    Types: Marijuana, "Crack" cocaine    Comment: none in 15 yrs   Sexual activity: Not Currently  Other Topics Concern   Not on file  Social History Narrative   Not on file   Social Drivers of Health  Financial Resource Strain: Medium Risk (08/18/2023)   Received from [facility]   Overall Financial Resource Strain (CARDIA)    Difficulty of Paying Living Expenses: Somewhat hard  Food Insecurity: No Food Insecurity (10/17/2023)   Hunger Vital Sign    Worried About Running Out of Food in the Last Year: Never true    Ran Out of Food in the Last Year: Never true  Transportation Needs: No Transportation Needs (10/17/2023)   PRAPARE - Administrator, Civil Service (Medical): No    Lack of Transportation (Non-Medical): No  Physical Activity: Not on file  Stress: Not on file  Social Connections: Not on file  Intimate Partner Violence: Not At Risk (10/17/2023)   Humiliation, Afraid, Rape, and Kick questionnaire    Fear of Current or Ex-Partner: No    Emotionally Abused: No    Physically Abused: No    Sexually Abused: No    FAMILY HISTORY: Family History  Problem Relation Age of Onset   Asthma Mother    Hypertension Father     ALLERGIES:  has no known allergies.  MEDICATIONS:  Current Outpatient Medications  Medication Sig Dispense Refill   albuterol  (VENTOLIN  HFA) 108 (90 Base) MCG/ACT inhaler Inhale 1-2 puffs into the lungs every 6 (six) hours as needed for wheezing or shortness of breath. 8 g 0   apixaban  (ELIQUIS ) 2.5 MG TABS tablet Take 1 tablet (2.5 mg total) by mouth  2 (two) times daily. 60 tablet 3   clopidogrel  (PLAVIX ) 75 MG tablet Take 1 tablet (75 mg total) by mouth daily. 30 tablet 0   famotidine  (PEPCID ) 20 MG tablet Take 1 tablet (20 mg total) by mouth daily. 30 tablet 0   FLUoxetine  (PROZAC ) 20 MG capsule Take 1 capsule (20 mg total) by mouth daily. 30 capsule 3   Fluticasone -Umeclidin-Vilant (TRELEGY ELLIPTA ) 100-62.5-25 MCG/ACT AEPB Inhale 1 puff into the lungs daily. 28 each 1   ipratropium-albuterol  (DUONEB) 0.5-2.5 (3) MG/3ML SOLN Take 3 mLs by nebulization 3 (three) times daily. 360 mL 1   mirtazapine  (REMERON ) 7.5 MG tablet Take 1 tablet (7.5 mg total) by mouth at bedtime. 30 tablet 5   montelukast  (SINGULAIR ) 10 MG tablet Take 1 tablet (10 mg total) by mouth at bedtime. 30 tablet 1   potassium chloride  (KLOR-CON  M) 10 MEQ tablet Take 1 tablet (10 mEq total) by mouth daily. 30 tablet 1   pravastatin  (PRAVACHOL ) 20 MG tablet Take 1 tablet (20 mg total) by mouth daily at 6 PM. 30 tablet 0   QUEtiapine  (SEROQUEL ) 300 MG tablet Take 2 tablets (600 mg total) by mouth at bedtime. 60 tablet 0   senna-docusate (SENOKOT-S) 8.6-50 MG tablet Take 1 tablet by mouth daily after breakfast. 30 tablet 0   traZODone  (DESYREL ) 100 MG tablet Take 2 tablets (200 mg total) by mouth at bedtime as needed for sleep. 30 tablet 0   guaiFENesin  (MUCINEX ) 600 MG 12 hr tablet Take 1 tablet (600 mg total) by mouth 2 (two) times daily. (Patient not taking: Reported on 12/01/2023) 30 tablet 1   No current facility-administered medications for this visit.   Facility-Administered Medications Ordered in Other Visits  Medication Dose Route Frequency Provider Last Rate Last Admin   0.9 %  sodium chloride  infusion   Intravenous Continuous [name], MD   Stopped at 01/11/24 1130    Review of Systems  Constitutional:  Negative for appetite change, chills, fatigue, fever and unexpected weight change.  HENT:   Negative  for hearing loss and voice change.   Eyes:  Negative for eye  problems and icterus.  Respiratory:  Negative for chest tightness, cough and hemoptysis.   Cardiovascular:  Negative for chest pain and leg swelling.  Gastrointestinal:  Negative for abdominal distention and abdominal pain.  Endocrine: Negative for hot flashes.  Genitourinary:  Negative for difficulty urinating, dysuria and frequency.   Musculoskeletal:  Negative for arthralgias.  Skin:  Negative for itching and rash.  Neurological:  Negative for light-headedness and numbness.  Hematological:  Negative for adenopathy. Does not bruise/bleed easily.  Psychiatric/Behavioral:  Negative for confusion.      PHYSICAL EXAMINATION: ECOG PERFORMANCE STATUS: 1 - Symptomatic but completely ambulatory  Vitals:   01/11/24 0905  BP: (!) 147/87  Pulse: 92  Resp: 18  Temp: 98.3 F (36.8 C)  SpO2: 95%   Filed Weights   01/11/24 0905  Weight: 215 lb 9.6 oz (97.8 kg)    Physical Exam Constitutional:      General: [name] is not in acute distress.    Appearance: [name] is not diaphoretic.  HENT:     Head: Normocephalic and atraumatic.  Eyes:     General: No scleral icterus. Cardiovascular:     Rate and Rhythm: Normal rate and regular rhythm.  Pulmonary:     Effort: Pulmonary effort is normal. No respiratory distress.     Breath sounds: No wheezing.     Comments: Decreased breath sound bilaterally, poor air entry Abdominal:     General: There is no distension.     Palpations: Abdomen is soft.     Tenderness: There is no abdominal tenderness.  Musculoskeletal:        General: Normal range of motion.     Cervical back: Normal range of motion and neck supple.  Skin:    General: Skin is warm and dry.     Findings: No erythema.  Neurological:     Mental Status: [name] is alert and oriented to person, place, and time. Mental status is at baseline.     Motor: No abnormal muscle tone.  Psychiatric:        Mood and Affect: Affect normal.     Comments: Flat      LABORATORY DATA:  I have reviewed  the data as listed    Latest Ref Rng & Units 01/11/2024    8:44 AM 12/28/2023    8:39 AM 12/14/2023    8:45 AM  CBC  WBC 4.0 - 10.5 K/uL 4.2  4.3  3.7   Hemoglobin 13.0 - 17.0 g/dL 96.2  95.2  84.1   Hematocrit 39.0 - 52.0 % 36.3  37.4  35.4   Platelets 150 - 400 K/uL 175  195  177       Latest Ref Rng & Units 01/11/2024    8:45 AM 12/28/2023    8:40 AM 12/14/2023    8:46 AM  CMP  Glucose 70 - 99 mg/dL 324  401  027   BUN 8 - 23 mg/dL 9  7  7    Creatinine 0.61 - 1.24 mg/dL 2.53  6.64  4.03   Sodium 135 - 145 mmol/L 139  139  138   Potassium 3.5 - 5.1 mmol/L 3.8  3.9  3.9   Chloride 98 - 111 mmol/L 107  107  106   CO2 22 - 32 mmol/L 25  24  24    Calcium 8.9 - 10.3 mg/dL 8.8  9.2  9.1   Total Protein 6.5 -  8.1 g/dL 7.1  7.4  6.8   Total Bilirubin 0.0 - 1.2 mg/dL 0.5  0.5  0.5   Alkaline Phos 38 - 126 U/L 71  73  62   AST 15 - 41 U/L 17  17  12    ALT 0 - 44 U/L 12  12  9       RADIOGRAPHIC STUDIES: I have personally reviewed the radiological images as listed and agreed with the findings in the report. No results found.

## 2024-01-11 NOTE — Assessment & Plan Note (Signed)
 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  Left upper lobe adenocarcinoma, No enough tissue for NGS, will send off liquid biopsy- send off liquid biopsy.  S/p concurrent chemotherapy radiation. -carboplatin  AUC 2 and taxol  45mg /m2  On Durvalumab  maintenance.   Clinically he is doing well today Labs are reviewed and discussed with patient. Proceed with Durvalumab  maintenance.

## 2024-01-11 NOTE — Patient Instructions (Signed)
 [facility] - A DEPT OF [facility]  Discharge Instructions: Thank you for choosing [facility] to provide your oncology and hematology care.  If you have a lab appointment with the Cancer Center, please go directly to the Cancer Center and check in at the registration area.  Wear comfortable clothing and clothing appropriate for easy access to any Portacath or PICC line.   We strive to give you quality time with your provider. You may need to reschedule your appointment if you arrive late (15 or more minutes).  Arriving late affects you and other patients whose appointments are after yours.  Also, if you miss three or more appointments without notifying the office, you may be dismissed from the clinic at the provider's discretion.      For prescription refill requests, have your pharmacy contact our office and allow 72 hours for refills to be completed.    Today you received the following chemotherapy and/or immunotherapy agents imfinzi      To help prevent nausea and vomiting after your treatment, we encourage you to take your nausea medication as directed.  BELOW ARE SYMPTOMS THAT SHOULD BE REPORTED IMMEDIATELY: *FEVER GREATER THAN 100.4 F (38 C) OR HIGHER *CHILLS OR SWEATING *NAUSEA AND VOMITING THAT IS NOT CONTROLLED WITH YOUR NAUSEA MEDICATION *UNUSUAL SHORTNESS OF BREATH *UNUSUAL BRUISING OR BLEEDING *URINARY PROBLEMS (pain or burning when urinating, or frequent urination) *BOWEL PROBLEMS (unusual diarrhea, constipation, pain near the anus) TENDERNESS IN MOUTH AND THROAT WITH OR WITHOUT PRESENCE OF ULCERS (sore throat, sores in mouth, or a toothache) UNUSUAL RASH, SWELLING OR PAIN  UNUSUAL VAGINAL DISCHARGE OR ITCHING   Items with * indicate a potential emergency and should be followed up as soon as possible or go to the Emergency Department if any problems should occur.  Please show the CHEMOTHERAPY ALERT CARD or IMMUNOTHERAPY  ALERT CARD at check-in to the Emergency Department and triage nurse.  Should you have questions after your visit or need to cancel or reschedule your appointment, please contact [facility] - A DEPT OF [facility]  [phone] and follow the prompts.  Office hours are 8:00 a.m. to 4:30 p.m. Monday - Friday. Please note that voicemails left after 4:00 p.m. may not be returned until the following business day.  We are closed weekends and major holidays. You have access to a nurse at all times for urgent questions. Please call the main number to the clinic [phone] and follow the prompts.  For any non-urgent questions, you may also contact your provider using MyChart. We now offer e-Visits for anyone 4 and older to request care online for non-urgent symptoms. For details visit [email or web address].   Also download the MyChart app! Go to the app store, search "MyChart", open the app, select [facility], and log in with your MyChart username and password.

## 2024-01-11 NOTE — Assessment & Plan Note (Signed)
 Continue albuterol inhaler PRN, Duonebs.  follow up with pulmonology Dr. Meredeth Ide.  Continue Singulair, mucinex.

## 2024-01-11 NOTE — Assessment & Plan Note (Signed)
 Continue Elqiuis to 2.5mg  BID. He is on Plavix  75mg  daily.

## 2024-01-12 LAB — T4: T4, Total: 5.1 ug/dL (ref 4.5–12.0)

## 2024-01-25 ENCOUNTER — Encounter: Payer: Self-pay | Admitting: Oncology

## 2024-01-25 ENCOUNTER — Inpatient Hospital Stay

## 2024-01-25 ENCOUNTER — Inpatient Hospital Stay (HOSPITAL_BASED_OUTPATIENT_CLINIC_OR_DEPARTMENT_OTHER): Admitting: Oncology

## 2024-01-25 VITALS — BP 130/85 | HR 94 | Temp 96.0°F | Resp 18 | Wt 212.7 lb

## 2024-01-25 VITALS — BP 136/89 | HR 88

## 2024-01-25 DIAGNOSIS — E876 Hypokalemia: Secondary | ICD-10-CM | POA: Diagnosis not present

## 2024-01-25 DIAGNOSIS — Z5112 Encounter for antineoplastic immunotherapy: Secondary | ICD-10-CM | POA: Diagnosis not present

## 2024-01-25 DIAGNOSIS — C3412 Malignant neoplasm of upper lobe, left bronchus or lung: Secondary | ICD-10-CM | POA: Diagnosis not present

## 2024-01-25 DIAGNOSIS — I2699 Other pulmonary embolism without acute cor pulmonale: Secondary | ICD-10-CM

## 2024-01-25 DIAGNOSIS — C3492 Malignant neoplasm of unspecified part of left bronchus or lung: Secondary | ICD-10-CM | POA: Diagnosis not present

## 2024-01-25 DIAGNOSIS — J449 Chronic obstructive pulmonary disease, unspecified: Secondary | ICD-10-CM

## 2024-01-25 LAB — CBC WITH DIFFERENTIAL (CANCER CENTER ONLY)
Abs Immature Granulocytes: 0.01 10*3/uL (ref 0.00–0.07)
Basophils Absolute: 0 10*3/uL (ref 0.0–0.1)
Basophils Relative: 0 %
Eosinophils Absolute: 0.1 10*3/uL (ref 0.0–0.5)
Eosinophils Relative: 2 %
HCT: 38.2 % — ABNORMAL LOW (ref 39.0–52.0)
Hemoglobin: 12.4 g/dL — ABNORMAL LOW (ref 13.0–17.0)
Immature Granulocytes: 0 %
Lymphocytes Relative: 37 %
Lymphs Abs: 1.7 10*3/uL (ref 0.7–4.0)
MCH: 29.7 pg (ref 26.0–34.0)
MCHC: 32.5 g/dL (ref 30.0–36.0)
MCV: 91.6 fL (ref 80.0–100.0)
Monocytes Absolute: 0.4 10*3/uL (ref 0.1–1.0)
Monocytes Relative: 8 %
Neutro Abs: 2.4 10*3/uL (ref 1.7–7.7)
Neutrophils Relative %: 53 %
Platelet Count: 182 10*3/uL (ref 150–400)
RBC: 4.17 MIL/uL — ABNORMAL LOW (ref 4.22–5.81)
RDW: 14.7 % (ref 11.5–15.5)
WBC Count: 4.6 10*3/uL (ref 4.0–10.5)
nRBC: 0 % (ref 0.0–0.2)

## 2024-01-25 LAB — CMP (CANCER CENTER ONLY)
ALT: 10 U/L (ref 0–44)
AST: 16 U/L (ref 15–41)
Albumin: 4.1 g/dL (ref 3.5–5.0)
Alkaline Phosphatase: 82 U/L (ref 38–126)
Anion gap: 9 (ref 5–15)
BUN: 8 mg/dL (ref 8–23)
CO2: 24 mmol/L (ref 22–32)
Calcium: 9.4 mg/dL (ref 8.9–10.3)
Chloride: 105 mmol/L (ref 98–111)
Creatinine: 0.64 mg/dL (ref 0.61–1.24)
GFR, Estimated: >60 mL/min (ref >60)
Glucose, Bld: 109 mg/dL — ABNORMAL HIGH (ref 70–99)
Potassium: 3.6 mmol/L (ref 3.5–5.1)
Sodium: 138 mmol/L (ref 135–145)
Total Bilirubin: 0.7 mg/dL (ref 0.0–1.2)
Total Protein: 7.5 g/dL (ref 6.5–8.1)

## 2024-01-25 LAB — TSH: TSH: 0.268 u[IU]/mL — ABNORMAL LOW (ref 0.350–4.500)

## 2024-01-25 MED ORDER — SODIUM CHLORIDE 0.9 % IV SOLN
10.0000 mg/kg | Freq: Once | INTRAVENOUS | Status: AC
  Administered 2024-01-25: 1000 mg via INTRAVENOUS
  Filled 2024-01-25: qty 20

## 2024-01-25 MED ORDER — SODIUM CHLORIDE 0.9 % IV SOLN
INTRAVENOUS | Status: DC
  Filled 2024-01-25 (×2): qty 250

## 2024-01-25 NOTE — Progress Notes (Signed)
 Hematology/Oncology Progress note Telephone:([phone] Fax:(336) [phone]      CHIEF COMPLAINTS/PURPOSE OF CONSULTATION:  Stage III Lung cancer  ASSESSMENT & PLAN:   Cancer Staging  Adenocarcinoma of lung [facility]) Staging form: Lung, AJCC 8th Edition - Clinical stage from 03/22/2023: cT1, cN3, cM0 - Signed by Jacob Forbes, MD on 03/22/2023   Adenocarcinoma of lung (HCC) Clinically he has Stage III left lung cancer He has right paratracheal lymph node hypermetabolic activity, although Station 7 lymph node showed atypical cell, no definite malignancy.  Left upper lobe adenocarcinoma, No enough tissue for NGS, liquid biopsy- S/p concurrent chemotherapy radiation. -carboplatin  AUC 2 and taxol  45mg /m2  On Durvalumab  maintenance.   Clinically he is doing well today Labs are reviewed and discussed with patient. Proceed with Durvalumab  maintenance.   Pulmonary embolism (HCC) Continue Elqiuis to 2.5mg  BID. He is on Plavix  75mg  daily.    Hypokalemia Stable continue  potassium chloride  10meq daily,   Encounter for antineoplastic immunotherapy Immunotherapy plan as planned.   COPD (chronic obstructive pulmonary disease) (HCC) Continue albuterol  inhaler PRN, Duonebs.  follow up with pulmonology Dr. Jamal Chavez.  Continue Singulair , mucinex .   No orders of the defined types were placed in this encounter.  Follow up  2 weeks Lab MD All questions were answered. The patient knows to call the clinic with any problems, questions or concerns.  Jacob Forbes, MD, PhD [facility] Hematology Oncology 01/25/2024    HISTORY OF PRESENTING ILLNESS:  [name] 63 y.o. male presents for follow up of lung cancer.  I have reviewed his chart and materials related to his cancer extensively and collaborated history with the patient. Summary of oncologic history is as follows:  12/08/2021 CT chest with contrast showed irregular solid pulmonary nodule of right upper lobe, increased in size.  Bibasilar  consolidation.  Mildly enlarged right lower paratracheal lymph node.  Atherosclerosis. 01/04/2022 PET scan showed 10 mm irregular nodule in the lateral right upper lobe, suspicious for primary bronchogenic neoplasm. 2 small right paratracheal nodes, indeterminate and favored to be reactive.  Attention on follow-up.  Patient was seen by radiation oncology Dr. Jacalyn Martin.  Patient was offered SBRT to the right upper lobe nodule/presumed non-small cell lung cancer stage I.  Patient lives in a group home.  He reports chronic cough.  Some shortness of breath with exertion. He is a current everyday smoker.  Few cigarettes per day.  He denies any unintentional weight loss, night sweats, fever or chills. Patient is on aspirin  and Plavix    INTERVAL HISTORY [name] is a 63 y.o. male who has above history reviewed by me today presents for follow up visit for lung cancer.  Oncology History  Adenocarcinoma of lung (HCC)  12/29/2022 Imaging   CT chest with contrast showed Stable radiation changes involving the right upper lobe with a small residual treated nodule.  Decrease in size.  Interval enlargement of the left upper lobe pulmonary nodule.  Stable mediastinal nodes stable advanced emphysematous changes and areas of pulmonary scarring.  Stable bilateral adrenal gland adenomas.  Emphysema   01/30/2023 Imaging   PET 1. Recurrent bronchogenic carcinoma in the left upper lobe with new and increasingly hypermetabolic mediastinal lymph nodes. No evidence of distant metastatic disease. 2. Possible mild prostate hypermetabolism. Consider laboratory correlation. 3. Liver is mildly heterogeneous, raising suspicion for steatosis. 4. Cholelithiasis. 5. Right adrenal adenoma.  Left adrenal myelolipoma. 6. Aortic atherosclerosis (ICD10-I70.0). Coronary artery calcification. 7. Enlarged pulmonic trunk, indicative of pulmonary arterial hypertension.  03/15/2023 Initial Diagnosis   Adenocarcinoma of left lung    Bronchoscopy showed  Lung, biopsy, Left upper lobe - NON-SMALL CELL CARCINOMA, FAVOR ADENOCARCINOMA.  1. Bronchial Lavage, Left upper lobe - POSITIVE FOR MALIGNANCY. - NON-SMALL CELL CARCINOMA, FAVOR ADENOCARCINOMA. - SEE NOTE. 2. Bronchial Brushing, Left upper lobe - POSITIVE FOR MALIGNANCY. - NON-SMALL CELL CARCINOMA, FAVOR ADENOCARCINOMA. 3. Bronchus, biopsy, left upper lobe - SEE [id number] 4. Fine Needle Aspiration, left upper lobe - POSITIVE FOR MALIGNANCY. - NON-SMALL CELL CARCINOMA, FAVOR ADENOCARCINOMA. 5. Fine Needle Aspiration, station 7 lymph node - ATYPICAL. - BACKGROUND LYMPHOID TISSUE IS PRESENT CONSISTENT WITH LYMPH NODE SAMPLING.    03/15/2023 Procedure   Fiberoptic bronchoscopy with airway inspection and BAL Procedure findings:   Bronchoscope was inserted via ETT  without difficulty.  Posterior oropharynx, epiglottis, arytenoids, false cords and vocal cords were not visualized as these were bypassed by endotracheal tube. The distal trachea was normal in circumference and appearance without mucosal, cartilaginous or branching abnormalities.  The main carina was mildly splayed . All right and left lobar airways were visualized to the Subsegmental level.  Sub- sub segmental carinae were identified in all the distal airways.   Secretions were visible in the following airways and appeared to be clear.  The mucosa was : friable at LEFT UPPER LOBE   Airways were notable for:        exophytic lesions :n       extrinsic compression in the following distributions: n.       Friable mucosa: y       Teacher, music /pigmentation: n   MUCUS PLUGGING WAS SEVERE ON LEFT SIDE AND COMPLETE OBSTRUCTED MULTIPLE AIRWAYS.  UNABLE TO NAVIGATE THROUGH TRACHEOBRONCHIAL TREE DUE TO SEVERE MUCUS PLUGGING. THERAPEUTIC ASPIRATION WAS PERFORMED X 7 AND WAS DONE BILATERALLY ALTHOUGH WORSE ON LEFT.  BAL WAS PERFORMED AT LEFT UPPER LOBE X 2.     Endobronchial ultrasound assisted  hilar and mediastinal lymph node biopsies procedure findings: The fiberoptic bronchoscope was removed and the EBUS scope was introduced. Examination began to evaluate for pathologically enlarged lymph nodes starting on the RIGHT  side progressing to the LEFT side.  All lymph node biopsies performed with 21G  needle. Lymph node biopsies were sent in cytolite for all stations.   STATION 10R - 6mm not biopsied STATION 7 - 1.2cm biopsied 3 times STATION 10L - 5mm not biopsied  STATION 4L - 6mm not biopsied    03/22/2023 Cancer Staging   Staging form: Lung, AJCC 8th Edition - Clinical stage from 03/22/2023: cT1, cN3, cM0 - Signed by Jacob Forbes, MD on 03/22/2023 Stage prefix: Initial diagnosis   04/20/2023 - 06/08/2023 Chemotherapy   Patient is on Treatment Plan : LUNG Carboplatin  + Paclitaxel  + XRT q7d     09/01/2023 -  Chemotherapy   Patient is on Treatment Plan : LUNG Durvalumab  (10) q14d     10/15/2023 - 10/24/2023 Hospital Admission   Hospitalized due to Influenza A, COPD exacerbation, acute respiratory failure.    11/03/2023 Imaging   CT chest abdomen pelvis w contrast showed CHEST:   1. Stable LEFT upper lobe peribronchial lesion. 2. No evidence of metastatic adenopathy. 3. Stable small RIGHT paratracheal node. 4. Slight improvement in atelectasis in the RIGHT middle lobe.   PELVIS:   1. No evidence of metastatic disease in the abdomen pelvis. 2. Stable small hypodensity in the spleen and liver. 3. Stable benign RIGHT adrenal adenoma and and LEFT adrenal angiomyolipoma.  He reports feel well Chronic cough and SOB, he feels breathing status is at baseline today he is on Eliquis  2.5mg  BID and Plavix .     MEDICAL HISTORY:  Past Medical History:  Diagnosis Date   Acute on chronic respiratory failure with hypoxia (HCC)    Asthma    Coagulation disorder (HCC)    COPD (chronic obstructive pulmonary disease) (HCC)    Depression    History of hiatal hernia    Hypertension     Hypokalemia    Leukocytosis    Lung mass    Normocytic anemia 07/13/2023   Pneumonia    Schizophrenia (HCC)    Shortness of breath dyspnea    Stroke [facility])    Umbilical hernia    Ventral hernia     SURGICAL HISTORY: Past Surgical History:  Procedure Laterality Date   COLONOSCOPY WITH PROPOFOL  N/A 09/21/2021   Procedure: COLONOSCOPY WITH PROPOFOL ;  Surgeon: Shane Darling, MD;  Location: [facility] END[facility];  Service: Endoscopy;  Laterality: N/A;   HERNIA REPAIR     INSERTION OF MESH N/A 12/18/2014   Procedure: INSERTION OF MESH;  Surgeon: Claudia Cuff, MD;  Location: [facility] ORS;  Service: General;  Laterality: N/A;   SUPRA-UMBILICAL HERNIA  12/18/2014   Procedure: SUPRA-UMBILICAL HERNIA;  Surgeon: Claudia Cuff, MD;  Location: [facility] ORS;  Service: General;;   UMBILICAL HERNIA REPAIR N/A 12/18/2014   Procedure: HERNIA REPAIR UMBILICAL ADULT;  Surgeon: Claudia Cuff, MD;  Location: [facility] ORS;  Service: General;  Laterality: N/A;   VIDEO BRONCHOSCOPY WITH ENDOBRONCHIAL ULTRASOUND N/A 03/15/2023   Procedure: VIDEO BRONCHOSCOPY WITH ENDOBRONCHIAL ULTRASOUND;  Surgeon: Erskin Hearing, MD;  Location: [facility] ORS;  Service: Thoracic;  Laterality: N/A;    SOCIAL HISTORY: Social History   Socioeconomic History   Marital status: Widowed    Spouse name: Not on file   Number of children: Not on file   Years of education: Not on file   Highest education level: Not on file  Occupational History   Not on file  Tobacco Use   Smoking status: Former    Current packs/day: 0.15    Average packs/day: 0.2 packs/day for 45.0 years (6.8 ttl pk-yrs)    Types: Cigarettes   Smokeless tobacco: Never  Vaping Use   Vaping status: Never Used  Substance and Sexual Activity   Alcohol use: Not Currently    Alcohol/week: 75.0 standard drinks of alcohol    Types: 75 Cans of beer per week   Drug use: Not Currently    Types: Marijuana, Crack cocaine    Comment: none in 15 yrs   Sexual activity: Not  Currently  Other Topics Concern   Not on file  Social History Narrative   Not on file   Social Drivers of Health   Financial Resource Strain: Medium Risk (08/18/2023)   Received from [facility]   Overall Financial Resource Strain (CARDIA)    Difficulty of Paying Living Expenses: Somewhat hard  Food Insecurity: No Food Insecurity (10/17/2023)   Hunger Vital Sign    Worried About Running Out of Food in the Last Year: Never true    Ran Out of Food in the Last Year: Never true  Transportation Needs: No Transportation Needs (10/17/2023)   PRAPARE - Administrator, Civil Service (Medical): No    Lack of Transportation (Non-Medical): No  Physical Activity: Not on file  Stress: Not on file  Social Connections: Not on file  Intimate Partner Violence:  Not At Risk (10/17/2023)   Humiliation, Afraid, Rape, and Kick questionnaire    Fear of Current or Ex-Partner: No    Emotionally Abused: No    Physically Abused: No    Sexually Abused: No    FAMILY HISTORY: Family History  Problem Relation Age of Onset   Asthma Mother    Hypertension Father     ALLERGIES:  has no known allergies.  MEDICATIONS:  Current Outpatient Medications  Medication Sig Dispense Refill   albuterol  (VENTOLIN  HFA) 108 (90 Base) MCG/ACT inhaler Inhale 1-2 puffs into the lungs every 6 (six) hours as needed for wheezing or shortness of breath. 8 g 0   apixaban  (ELIQUIS ) 2.5 MG TABS tablet Take 1 tablet (2.5 mg total) by mouth 2 (two) times daily. 60 tablet 3   clopidogrel  (PLAVIX ) 75 MG tablet Take 1 tablet (75 mg total) by mouth daily. 30 tablet 0   famotidine  (PEPCID ) 20 MG tablet Take 1 tablet (20 mg total) by mouth daily. 30 tablet 0   FLUoxetine  (PROZAC ) 20 MG capsule Take 1 capsule (20 mg total) by mouth daily. 30 capsule 3   Fluticasone -Umeclidin-Vilant (TRELEGY ELLIPTA ) 100-62.5-25 MCG/ACT AEPB Inhale 1 puff into the lungs daily. 28 each 1   ipratropium-albuterol  (DUONEB) 0.5-2.5  (3) MG/3ML SOLN Take 3 mLs by nebulization 3 (three) times daily. 360 mL 1   mirtazapine  (REMERON ) 7.5 MG tablet Take 1 tablet (7.5 mg total) by mouth at bedtime. 30 tablet 5   montelukast  (SINGULAIR ) 10 MG tablet Take 1 tablet (10 mg total) by mouth at bedtime. 30 tablet 1   potassium chloride  (KLOR-CON  M) 10 MEQ tablet Take 1 tablet (10 mEq total) by mouth daily. 30 tablet 1   pravastatin  (PRAVACHOL ) 20 MG tablet Take 1 tablet (20 mg total) by mouth daily at 6 PM. 30 tablet 0   QUEtiapine  (SEROQUEL ) 300 MG tablet Take 2 tablets (600 mg total) by mouth at bedtime. 60 tablet 0   senna-docusate (SENOKOT-S) 8.6-50 MG tablet Take 1 tablet by mouth daily after breakfast. 30 tablet 0   traZODone  (DESYREL ) 100 MG tablet Take 2 tablets (200 mg total) by mouth at bedtime as needed for sleep. 30 tablet 0   guaiFENesin  (MUCINEX ) 600 MG 12 hr tablet Take 1 tablet (600 mg total) by mouth 2 (two) times daily. (Patient not taking: Reported on 01/25/2024) 30 tablet 1   No current facility-administered medications for this visit.   Facility-Administered Medications Ordered in Other Visits  Medication Dose Route Frequency Provider Last Rate Last Admin   0.9 %  sodium chloride  infusion   Intravenous Continuous Jacob Forbes, MD   Stopped at 01/25/24 1119    Review of Systems  Constitutional:  Negative for appetite change, chills, fatigue, fever and unexpected weight change.  HENT:   Negative for hearing loss and voice change.   Eyes:  Negative for eye problems and icterus.  Respiratory:  Negative for chest tightness, cough and hemoptysis.   Cardiovascular:  Negative for chest pain and leg swelling.  Gastrointestinal:  Negative for abdominal distention and abdominal pain.  Endocrine: Negative for hot flashes.  Genitourinary:  Negative for difficulty urinating, dysuria and frequency.   Musculoskeletal:  Negative for arthralgias.  Skin:  Negative for itching and rash.  Neurological:  Negative for light-headedness  and numbness.  Hematological:  Negative for adenopathy. Does not bruise/bleed easily.  Psychiatric/Behavioral:  Negative for confusion.      PHYSICAL EXAMINATION: ECOG PERFORMANCE STATUS: 1 - Symptomatic but completely ambulatory  Vitals:   01/25/24 0900 01/25/24 0905  BP: (!) 143/119 130/85  Pulse: 94   Resp: 18   Temp: (!) 96 F (35.6 C)   SpO2: 100%    Filed Weights   01/25/24 0900  Weight: 212 lb 11.2 oz (96.5 kg)    Physical Exam Constitutional:      General: He is not in acute distress.    Appearance: He is not diaphoretic.  HENT:     Head: Normocephalic and atraumatic.   Eyes:     General: No scleral icterus.   Cardiovascular:     Rate and Rhythm: Normal rate and regular rhythm.  Pulmonary:     Effort: Pulmonary effort is normal. No respiratory distress.     Breath sounds: No wheezing.     Comments: Decreased breath sound bilaterally, poor air entry Abdominal:     General: There is no distension.     Palpations: Abdomen is soft.     Tenderness: There is no abdominal tenderness.   Musculoskeletal:        General: Normal range of motion.     Cervical back: Normal range of motion and neck supple.   Skin:    General: Skin is warm and dry.     Findings: No erythema.   Neurological:     Mental Status: He is alert and oriented to person, place, and time. Mental status is at baseline.     Motor: No abnormal muscle tone.   Psychiatric:        Mood and Affect: Affect normal.     Comments: Flat      LABORATORY DATA:  I have reviewed the data as listed    Latest Ref Rng & Units 01/25/2024    8:20 AM 01/11/2024    8:44 AM 12/28/2023    8:39 AM  CBC  WBC 4.0 - 10.5 K/uL 4.6  4.2  4.3   Hemoglobin 13.0 - 17.0 g/dL 47.8  29.5  62.1   Hematocrit 39.0 - 52.0 % 38.2  36.3  37.4   Platelets 150 - 400 K/uL 182  175  195       Latest Ref Rng & Units 01/25/2024    8:20 AM 01/11/2024    8:45 AM 12/28/2023    8:40 AM  CMP  Glucose 70 - 99 mg/dL 308  657  846    BUN 8 - 23 mg/dL 8  9  7    Creatinine 0.61 - 1.24 mg/dL 9.62  9.52  8.41   Sodium 135 - 145 mmol/L 138  139  139   Potassium 3.5 - 5.1 mmol/L 3.6  3.8  3.9   Chloride 98 - 111 mmol/L 105  107  107   CO2 22 - 32 mmol/L 24  25  24    Calcium 8.9 - 10.3 mg/dL 9.4  8.8  9.2   Total Protein 6.5 - 8.1 g/dL 7.5  7.1  7.4   Total Bilirubin 0.0 - 1.2 mg/dL 0.7  0.5  0.5   Alkaline Phos 38 - 126 U/L 82  71  73   AST 15 - 41 U/L 16  17  17    ALT 0 - 44 U/L 10  12  12       RADIOGRAPHIC STUDIES: I have personally reviewed the radiological images as listed and agreed with the findings in the report. No results found.

## 2024-01-25 NOTE — Assessment & Plan Note (Addendum)
 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  Left upper lobe adenocarcinoma, No enough tissue for NGS, liquid biopsy- S/p concurrent chemotherapy radiation. -carboplatin  AUC 2 and taxol  45mg /m2  On Durvalumab  maintenance.   Clinically he is doing well today Labs are reviewed and discussed with patient. Proceed with Durvalumab  maintenance.

## 2024-01-25 NOTE — Assessment & Plan Note (Signed)
 Continue albuterol inhaler PRN, Duonebs.  follow up with pulmonology Dr. Meredeth Ide.  Continue Singulair, mucinex.

## 2024-01-25 NOTE — Assessment & Plan Note (Signed)
 Continue Elqiuis to 2.5mg  BID. He is on Plavix  75mg  daily.

## 2024-01-25 NOTE — Assessment & Plan Note (Signed)
 Immunotherapy plan as planned.

## 2024-01-25 NOTE — Assessment & Plan Note (Signed)
 Stable continue  potassium chloride daily,

## 2024-01-26 LAB — T4: T4, Total: 5.9 ug/dL (ref 4.5–12.0)

## 2024-01-31 ENCOUNTER — Encounter: Payer: Self-pay | Admitting: Oncology

## 2024-02-08 ENCOUNTER — Inpatient Hospital Stay

## 2024-02-08 ENCOUNTER — Inpatient Hospital Stay: Attending: Oncology

## 2024-02-08 ENCOUNTER — Encounter: Payer: Self-pay | Admitting: Oncology

## 2024-02-08 ENCOUNTER — Inpatient Hospital Stay (HOSPITAL_BASED_OUTPATIENT_CLINIC_OR_DEPARTMENT_OTHER): Admitting: Oncology

## 2024-02-08 VITALS — BP 156/91 | HR 78

## 2024-02-08 VITALS — BP 108/74 | HR 85 | Temp 96.2°F | Resp 18 | Wt 215.3 lb

## 2024-02-08 DIAGNOSIS — I700 Atherosclerosis of aorta: Secondary | ICD-10-CM | POA: Diagnosis not present

## 2024-02-08 DIAGNOSIS — Z5112 Encounter for antineoplastic immunotherapy: Secondary | ICD-10-CM | POA: Diagnosis not present

## 2024-02-08 DIAGNOSIS — I2699 Other pulmonary embolism without acute cor pulmonale: Secondary | ICD-10-CM | POA: Diagnosis not present

## 2024-02-08 DIAGNOSIS — Z7901 Long term (current) use of anticoagulants: Secondary | ICD-10-CM | POA: Diagnosis not present

## 2024-02-08 DIAGNOSIS — E876 Hypokalemia: Secondary | ICD-10-CM | POA: Diagnosis not present

## 2024-02-08 DIAGNOSIS — K8020 Calculus of gallbladder without cholecystitis without obstruction: Secondary | ICD-10-CM | POA: Diagnosis not present

## 2024-02-08 DIAGNOSIS — Z79899 Other long term (current) drug therapy: Secondary | ICD-10-CM | POA: Insufficient documentation

## 2024-02-08 DIAGNOSIS — F17210 Nicotine dependence, cigarettes, uncomplicated: Secondary | ICD-10-CM | POA: Insufficient documentation

## 2024-02-08 DIAGNOSIS — I10 Essential (primary) hypertension: Secondary | ICD-10-CM | POA: Insufficient documentation

## 2024-02-08 DIAGNOSIS — C3412 Malignant neoplasm of upper lobe, left bronchus or lung: Secondary | ICD-10-CM | POA: Diagnosis present

## 2024-02-08 DIAGNOSIS — J449 Chronic obstructive pulmonary disease, unspecified: Secondary | ICD-10-CM

## 2024-02-08 DIAGNOSIS — Z8673 Personal history of transient ischemic attack (TIA), and cerebral infarction without residual deficits: Secondary | ICD-10-CM | POA: Diagnosis not present

## 2024-02-08 DIAGNOSIS — C3492 Malignant neoplasm of unspecified part of left bronchus or lung: Secondary | ICD-10-CM

## 2024-02-08 DIAGNOSIS — Z7902 Long term (current) use of antithrombotics/antiplatelets: Secondary | ICD-10-CM | POA: Insufficient documentation

## 2024-02-08 DIAGNOSIS — J432 Centrilobular emphysema: Secondary | ICD-10-CM | POA: Insufficient documentation

## 2024-02-08 DIAGNOSIS — I2510 Atherosclerotic heart disease of native coronary artery without angina pectoris: Secondary | ICD-10-CM | POA: Insufficient documentation

## 2024-02-08 DIAGNOSIS — D3501 Benign neoplasm of right adrenal gland: Secondary | ICD-10-CM | POA: Insufficient documentation

## 2024-02-08 DIAGNOSIS — F209 Schizophrenia, unspecified: Secondary | ICD-10-CM | POA: Diagnosis not present

## 2024-02-08 LAB — CBC WITH DIFFERENTIAL (CANCER CENTER ONLY)
Abs Immature Granulocytes: 0.05 10*3/uL (ref 0.00–0.07)
Basophils Absolute: 0 10*3/uL (ref 0.0–0.1)
Basophils Relative: 1 %
Eosinophils Absolute: 0.1 10*3/uL (ref 0.0–0.5)
Eosinophils Relative: 1 %
HCT: 38 % — ABNORMAL LOW (ref 39.0–52.0)
Hemoglobin: 12.3 g/dL — ABNORMAL LOW (ref 13.0–17.0)
Immature Granulocytes: 1 %
Lymphocytes Relative: 24 %
Lymphs Abs: 1.3 10*3/uL (ref 0.7–4.0)
MCH: 29.6 pg (ref 26.0–34.0)
MCHC: 32.4 g/dL (ref 30.0–36.0)
MCV: 91.3 fL (ref 80.0–100.0)
Monocytes Absolute: 0.4 10*3/uL (ref 0.1–1.0)
Monocytes Relative: 7 %
Neutro Abs: 3.5 10*3/uL (ref 1.7–7.7)
Neutrophils Relative %: 66 %
Platelet Count: 202 10*3/uL (ref 150–400)
RBC: 4.16 MIL/uL — ABNORMAL LOW (ref 4.22–5.81)
RDW: 14.4 % (ref 11.5–15.5)
WBC Count: 5.3 10*3/uL (ref 4.0–10.5)
nRBC: 0 % (ref 0.0–0.2)

## 2024-02-08 LAB — CMP (CANCER CENTER ONLY)
ALT: 13 U/L (ref 0–44)
AST: 17 U/L (ref 15–41)
Albumin: 4 g/dL (ref 3.5–5.0)
Alkaline Phosphatase: 85 U/L (ref 38–126)
Anion gap: 6 (ref 5–15)
BUN: 6 mg/dL — ABNORMAL LOW (ref 8–23)
CO2: 26 mmol/L (ref 22–32)
Calcium: 9.1 mg/dL (ref 8.9–10.3)
Chloride: 104 mmol/L (ref 98–111)
Creatinine: 0.76 mg/dL (ref 0.61–1.24)
GFR, Estimated: >60 mL/min (ref >60)
Glucose, Bld: 107 mg/dL — ABNORMAL HIGH (ref 70–99)
Potassium: 3.7 mmol/L (ref 3.5–5.1)
Sodium: 136 mmol/L (ref 135–145)
Total Bilirubin: 0.6 mg/dL (ref 0.0–1.2)
Total Protein: 7.4 g/dL (ref 6.5–8.1)

## 2024-02-08 LAB — TSH: TSH: 0.292 u[IU]/mL — ABNORMAL LOW (ref 0.350–4.500)

## 2024-02-08 MED ORDER — SODIUM CHLORIDE 0.9 % IV SOLN
10.0000 mg/kg | Freq: Once | INTRAVENOUS | Status: AC
  Administered 2024-02-08: 1000 mg via INTRAVENOUS
  Filled 2024-02-08: qty 20

## 2024-02-08 MED ORDER — SODIUM CHLORIDE 0.9 % IV SOLN
INTRAVENOUS | Status: DC
  Filled 2024-02-08 (×2): qty 250

## 2024-02-08 NOTE — Progress Notes (Signed)
 Hematology/Oncology Progress note [phone] Fax:([phone]      CHIEF COMPLAINTS/PURPOSE OF CONSULTATION:  Stage III Lung cancer  ASSESSMENT & PLAN:   Cancer Staging  Adenocarcinoma of lung [facility]) Staging form: Lung, AJCC 8th Edition - Clinical stage from 03/22/2023: cT1, cN3, cM0 - Signed by Babara Call, MD on 03/22/2023   Adenocarcinoma of lung (HCC) Clinically he has Stage III left lung cancer He has right paratracheal lymph node hypermetabolic activity, although Station 7 lymph node showed atypical cell, no definite malignancy.  Left upper lobe adenocarcinoma, No enough tissue for NGS, liquid biopsy showed actionable variants.   S/p concurrent chemotherapy radiation. -carboplatin  AUC 2 and taxol  45mg /m2  On Durvalumab  maintenance.   Clinically he is doing well today Labs are reviewed and discussed with patient. Proceed with Durvalumab  maintenance.   Pulmonary embolism (HCC) Continue Elqiuis to 2.5mg  BID. He is on Plavix  75mg  daily.    Hypokalemia Normalized. Hold potassium chloride  10meq daily,  Recommend patient to increase potassium rich food.   Encounter for antineoplastic immunotherapy Immunotherapy plan as planned.   COPD (chronic obstructive pulmonary disease) (HCC) Continue albuterol  inhaler PRN, Duonebs.  follow up with pulmonology Dr. Theotis.  Continue Singulair , mucinex .   Orders Placed This Encounter  Procedures   CT Chest W Contrast    Standing Status:   Future    Expected Date:   02/26/2024    Expiration Date:   02/07/2025    If indicated for the ordered procedure, I authorize the administration of contrast media per Radiology protocol:   Yes    Does the patient have a contrast media/X-ray dye allergy?:   No    Preferred imaging location?:    [facility]   CBC with Differential (Cancer Center Only)    Standing Status:   Future    Expected Date:   03/07/2024    Expiration Date:   03/07/2025   CMP (Cancer Center only)    Standing  Status:   Future    Expected Date:   03/07/2024    Expiration Date:   03/07/2025   T4    Standing Status:   Future    Expected Date:   03/07/2024    Expiration Date:   03/07/2025   TSH    Standing Status:   Future    Expected Date:   03/07/2024    Expiration Date:   03/07/2025   Follow up  2 weeks Lab MD All questions were answered. The patient knows to call the clinic with any problems, questions or concerns.  Call Babara, MD, PhD [facility] Hematology Oncology 02/08/2024    HISTORY OF PRESENTING ILLNESS:  [name] 63 y.o. male presents for follow up of lung cancer.  I have reviewed his chart and materials related to his cancer extensively and collaborated history with the patient. Summary of oncologic history is as follows:  12/08/2021 CT chest with contrast showed irregular solid pulmonary nodule of right upper lobe, increased in size.  Bibasilar consolidation.  Mildly enlarged right lower paratracheal lymph node.  Atherosclerosis. 01/04/2022 PET scan showed 10 mm irregular nodule in the lateral right upper lobe, suspicious for primary bronchogenic neoplasm. 2 small right paratracheal nodes, indeterminate and favored to be reactive.  Attention on follow-up.  Patient was seen by radiation oncology Dr. Lenn.  Patient was offered SBRT to the right upper lobe nodule/presumed non-small cell lung cancer stage I.  Patient lives in a group home.  He reports chronic cough.  Some shortness of breath  with exertion. He is a current everyday smoker.  Few cigarettes per day.  He denies any unintentional weight loss, night sweats, fever or chills. Patient is on aspirin  and Plavix    INTERVAL HISTORY [name] is a 63 y.o. male who has above history reviewed by me today presents for follow up visit for lung cancer.  Oncology History  Adenocarcinoma of lung (HCC)  12/29/2022 Imaging   CT chest with contrast showed Stable radiation changes involving the right upper lobe with a small residual  treated nodule.  Decrease in size.  Interval enlargement of the left upper lobe pulmonary nodule.  Stable mediastinal nodes stable advanced emphysematous changes and areas of pulmonary scarring.  Stable bilateral adrenal gland adenomas.  Emphysema   01/30/2023 Imaging   PET 1. Recurrent bronchogenic carcinoma in the left upper lobe with new and increasingly hypermetabolic mediastinal lymph nodes. No evidence of distant metastatic disease. 2. Possible mild prostate hypermetabolism. Consider laboratory correlation. 3. Liver is mildly heterogeneous, raising suspicion for steatosis. 4. Cholelithiasis. 5. Right adrenal adenoma.  Left adrenal myelolipoma. 6. Aortic atherosclerosis (ICD10-I70.0). Coronary artery calcification. 7. Enlarged pulmonic trunk, indicative of pulmonary arterial hypertension.   03/15/2023 Initial Diagnosis   Adenocarcinoma of left lung   Bronchoscopy showed  Lung, biopsy, Left upper lobe - NON-SMALL CELL CARCINOMA, FAVOR ADENOCARCINOMA.  1. Bronchial Lavage, Left upper lobe - POSITIVE FOR MALIGNANCY. - NON-SMALL CELL CARCINOMA, FAVOR ADENOCARCINOMA. - SEE NOTE. 2. Bronchial Brushing, Left upper lobe - POSITIVE FOR MALIGNANCY. - NON-SMALL CELL CARCINOMA, FAVOR ADENOCARCINOMA. 3. Bronchus, biopsy, left upper lobe - SEE [id number] 4. Fine Needle Aspiration, left upper lobe - POSITIVE FOR MALIGNANCY. - NON-SMALL CELL CARCINOMA, FAVOR ADENOCARCINOMA. 5. Fine Needle Aspiration, station 7 lymph node - ATYPICAL. - BACKGROUND LYMPHOID TISSUE IS PRESENT CONSISTENT WITH LYMPH NODE SAMPLING.    03/15/2023 Procedure   Fiberoptic bronchoscopy with airway inspection and BAL Procedure findings:   Bronchoscope was inserted via ETT  without difficulty.  Posterior oropharynx, epiglottis, arytenoids, false cords and vocal cords were not visualized as these were bypassed by endotracheal tube. The distal trachea was normal in circumference and appearance without mucosal,  cartilaginous or branching abnormalities.  The main carina was mildly splayed . All right and left lobar airways were visualized to the Subsegmental level.  Sub- sub segmental carinae were identified in all the distal airways.   Secretions were visible in the following airways and appeared to be clear.  The mucosa was : friable at LEFT UPPER LOBE   Airways were notable for:        exophytic lesions :n       extrinsic compression in the following distributions: n.       Friable mucosa: y       Teacher, music /pigmentation: n   MUCUS PLUGGING WAS SEVERE ON LEFT SIDE AND COMPLETE OBSTRUCTED MULTIPLE AIRWAYS.  UNABLE TO NAVIGATE THROUGH TRACHEOBRONCHIAL TREE DUE TO SEVERE MUCUS PLUGGING. THERAPEUTIC ASPIRATION WAS PERFORMED X 7 AND WAS DONE BILATERALLY ALTHOUGH WORSE ON LEFT.  BAL WAS PERFORMED AT LEFT UPPER LOBE X 2.     Endobronchial ultrasound assisted hilar and mediastinal lymph node biopsies procedure findings: The fiberoptic bronchoscope was removed and the EBUS scope was introduced. Examination began to evaluate for pathologically enlarged lymph nodes starting on the RIGHT  side progressing to the LEFT side.  All lymph node biopsies performed with 21G  needle. Lymph node biopsies were sent in cytolite for all stations.   STATION 10R - 6mm not biopsied  STATION 7 - 1.2cm biopsied 3 times STATION 10L - 5mm not biopsied  STATION 4L - 6mm not biopsied    03/22/2023 Cancer Staging   Staging form: Lung, AJCC 8th Edition - Clinical stage from 03/22/2023: cT1, cN3, cM0 - Signed by Babara Call, MD on 03/22/2023 Stage prefix: Initial diagnosis   04/20/2023 - 06/08/2023 Chemotherapy   Patient is on Treatment Plan : LUNG Carboplatin  + Paclitaxel  + XRT q7d     09/01/2023 -  Chemotherapy   Patient is on Treatment Plan : LUNG Durvalumab  (10) q14d     10/15/2023 - 10/24/2023 Hospital Admission   Hospitalized due to Influenza A, COPD exacerbation, acute respiratory failure.    11/03/2023 Imaging   CT  chest abdomen pelvis w contrast showed CHEST:   1. Stable LEFT upper lobe peribronchial lesion. 2. No evidence of metastatic adenopathy. 3. Stable small RIGHT paratracheal node. 4. Slight improvement in atelectasis in the RIGHT middle lobe.   PELVIS:   1. No evidence of metastatic disease in the abdomen pelvis. 2. Stable small hypodensity in the spleen and liver. 3. Stable benign RIGHT adrenal adenoma and and LEFT adrenal angiomyolipoma.      He reports feel well Chronic cough and SOB, he feels breathing status is at baseline today he is on Eliquis  2.5mg  BID and Plavix . No bleeding events.     MEDICAL HISTORY:  Past Medical History:  Diagnosis Date   Acute on chronic respiratory failure with hypoxia (HCC)    Asthma    Coagulation disorder (HCC)    COPD (chronic obstructive pulmonary disease) (HCC)    Depression    History of hiatal hernia    Hypertension    Hypokalemia    Leukocytosis    Lung mass    Normocytic anemia 07/13/2023   Pneumonia    Schizophrenia (HCC)    Shortness of breath dyspnea    Stroke [facility])    Umbilical hernia    Ventral hernia     SURGICAL HISTORY: Past Surgical History:  Procedure Laterality Date   COLONOSCOPY WITH PROPOFOL  N/A 09/21/2021   Procedure: COLONOSCOPY WITH PROPOFOL ;  Surgeon: Maryruth Ole DASEN, MD;  Location: [facility] ENDOSCOPY;  Service: Endoscopy;  Laterality: N/A;   HERNIA REPAIR     INSERTION OF MESH N/A 12/18/2014   Procedure: INSERTION OF MESH;  Surgeon: Charlie FORBES Fell, MD;  Location: [facility] ORS;  Service: General;  Laterality: N/A;   SUPRA-UMBILICAL HERNIA  12/18/2014   Procedure: SUPRA-UMBILICAL HERNIA;  Surgeon: Charlie FORBES Fell, MD;  Location: [facility] ORS;  Service: General;;   UMBILICAL HERNIA REPAIR N/A 12/18/2014   Procedure: HERNIA REPAIR UMBILICAL ADULT;  Surgeon: Charlie FORBES Fell, MD;  Location: [facility] ORS;  Service: General;  Laterality: N/A;   VIDEO BRONCHOSCOPY WITH ENDOBRONCHIAL ULTRASOUND N/A 03/15/2023   Procedure:  VIDEO BRONCHOSCOPY WITH ENDOBRONCHIAL ULTRASOUND;  Surgeon: Parris Manna, MD;  Location: [facility] ORS;  Service: Thoracic;  Laterality: N/A;    SOCIAL HISTORY: Social History   Socioeconomic History   Marital status: Widowed    Spouse name: Not on file   Number of children: Not on file   Years of education: Not on file   Highest education level: Not on file  Occupational History   Not on file  Tobacco Use   Smoking status: Former    Current packs/day: 0.15    Average packs/day: 0.2 packs/day for 45.0 years (6.8 ttl pk-yrs)    Types: Cigarettes   Smokeless tobacco: Never  Vaping Use   Vaping status:  Never Used  Substance and Sexual Activity   Alcohol use: Not Currently    Alcohol/week: 75.0 standard drinks of alcohol    Types: 75 Cans of beer per week   Drug use: Not Currently    Types: Marijuana, Crack cocaine    Comment: none in 15 yrs   Sexual activity: Not Currently  Other Topics Concern   Not on file  Social History Narrative   Not on file   Social Drivers of Health   Financial Resource Strain: Medium Risk (08/18/2023)   [facility]   Overall Financial Resource Strain (CARDIA)    Difficulty of Paying Living Expenses: Somewhat hard  Food Insecurity: No Food Insecurity (10/17/2023)   Hunger Vital Sign    Worried About Running Out of Food in the Last Year: Never true    Ran Out of Food in the Last Year: Never true  Transportation Needs: No Transportation Needs (10/17/2023)   PRAPARE - Administrator, Civil Service (Medical): No    Lack of Transportation (Non-Medical): No  Physical Activity: Not on file  Stress: Not on file  Social Connections: Not on file  Intimate Partner Violence: Not At Risk (10/17/2023)   Humiliation, Afraid, Rape, and Kick questionnaire    Fear of Current or Ex-Partner: No    Emotionally Abused: No    Physically Abused: No    Sexually Abused: No    FAMILY HISTORY: Family History  Problem  Relation Age of Onset   Asthma Mother    Hypertension Father     ALLERGIES:  has no known allergies.  MEDICATIONS:  Current Outpatient Medications  Medication Sig Dispense Refill   albuterol  (VENTOLIN  HFA) 108 (90 Base) MCG/ACT inhaler Inhale 1-2 puffs into the lungs every 6 (six) hours as needed for wheezing or shortness of breath. 8 g 0   apixaban  (ELIQUIS ) 2.5 MG TABS tablet Take 1 tablet (2.5 mg total) by mouth 2 (two) times daily. 60 tablet 3   clopidogrel  (PLAVIX ) 75 MG tablet Take 1 tablet (75 mg total) by mouth daily. 30 tablet 0   famotidine  (PEPCID ) 20 MG tablet Take 1 tablet (20 mg total) by mouth daily. 30 tablet 0   FLUoxetine  (PROZAC ) 20 MG capsule Take 1 capsule (20 mg total) by mouth daily. 30 capsule 3   Fluticasone -Umeclidin-Vilant (TRELEGY ELLIPTA ) 100-62.5-25 MCG/ACT AEPB Inhale 1 puff into the lungs daily. 28 each 1   ipratropium-albuterol  (DUONEB) 0.5-2.5 (3) MG/3ML SOLN Take 3 mLs by nebulization 3 (three) times daily. 360 mL 1   mirtazapine  (REMERON ) 7.5 MG tablet Take 1 tablet (7.5 mg total) by mouth at bedtime. 30 tablet 5   montelukast  (SINGULAIR ) 10 MG tablet Take 1 tablet (10 mg total) by mouth at bedtime. 30 tablet 1   potassium chloride  (KLOR-CON  M) 10 MEQ tablet Take 1 tablet (10 mEq total) by mouth daily. 30 tablet 1   pravastatin  (PRAVACHOL ) 20 MG tablet Take 1 tablet (20 mg total) by mouth daily at 6 PM. 30 tablet 0   QUEtiapine  (SEROQUEL ) 300 MG tablet Take 2 tablets (600 mg total) by mouth at bedtime. 60 tablet 0   senna-docusate (SENOKOT-S) 8.6-50 MG tablet Take 1 tablet by mouth daily after breakfast. 30 tablet 0   traZODone  (DESYREL ) 100 MG tablet Take 2 tablets (200 mg total) by mouth at bedtime as needed for sleep. 30 tablet 0   guaiFENesin  (MUCINEX ) 600 MG 12 hr tablet Take 1 tablet (600 mg total) by mouth 2 (  two) times daily. (Patient not taking: Reported on 02/08/2024) 30 tablet 1   No current facility-administered medications for this visit.    Facility-Administered Medications Ordered in Other Visits  Medication Dose Route Frequency Provider Last Rate Last Admin   0.9 %  sodium chloride  infusion   Intravenous Continuous Babara Call, MD   Stopped at 02/08/24 1208    Review of Systems  Constitutional:  Negative for appetite change, chills, fatigue, fever and unexpected weight change.  HENT:   Negative for hearing loss and voice change.   Eyes:  Negative for eye problems and icterus.  Respiratory:  Negative for chest tightness, cough and hemoptysis.   Cardiovascular:  Negative for chest pain and leg swelling.  Gastrointestinal:  Negative for abdominal distention and abdominal pain.  Endocrine: Negative for hot flashes.  Genitourinary:  Negative for difficulty urinating, dysuria and frequency.   Musculoskeletal:  Negative for arthralgias.  Skin:  Negative for itching and rash.  Neurological:  Negative for light-headedness and numbness.  Hematological:  Negative for adenopathy. Does not bruise/bleed easily.  Psychiatric/Behavioral:  Negative for confusion.      PHYSICAL EXAMINATION: ECOG PERFORMANCE STATUS: 1 - Symptomatic but completely ambulatory  Vitals:   02/08/24 0912  BP: 108/74  Pulse: 85  Resp: 18  Temp: (!) 96.2 F (35.7 C)  SpO2: 100%   Filed Weights   02/08/24 0912  Weight: 215 lb 4.8 oz (97.7 kg)    Physical Exam Constitutional:      General: He is not in acute distress.    Appearance: He is not diaphoretic.  HENT:     Head: Normocephalic and atraumatic.  Eyes:     General: No scleral icterus. Cardiovascular:     Rate and Rhythm: Normal rate and regular rhythm.  Pulmonary:     Effort: Pulmonary effort is normal. No respiratory distress.     Breath sounds: No wheezing.     Comments: Decreased breath sound bilaterally, poor air entry Abdominal:     General: There is no distension.     Palpations: Abdomen is soft.     Tenderness: There is no abdominal tenderness.  Musculoskeletal:         General: Normal range of motion.     Cervical back: Normal range of motion and neck supple.  Skin:    General: Skin is warm and dry.     Findings: No erythema.  Neurological:     Mental Status: He is alert and oriented to person, place, and time. Mental status is at baseline.     Motor: No abnormal muscle tone.  Psychiatric:        Mood and Affect: Affect normal.     Comments: Flat      LABORATORY DATA:  I have reviewed the data as listed    Latest Ref Rng & Units 02/08/2024    9:00 AM 01/25/2024    8:20 AM 01/11/2024    8:44 AM  CBC  WBC 4.0 - 10.5 K/uL 5.3  4.6  4.2   Hemoglobin 13.0 - 17.0 g/dL 87.6  87.5  88.2   Hematocrit 39.0 - 52.0 % 38.0  38.2  36.3   Platelets 150 - 400 K/uL 202  182  175       Latest Ref Rng & Units 02/08/2024    9:00 AM 01/25/2024    8:20 AM 01/11/2024    8:45 AM  CMP  Glucose 70 - 99 mg/dL 892  890  898   BUN 8 -  23 mg/dL 6  8  9    Creatinine 0.61 - 1.24 mg/dL 9.23  9.35  9.29   Sodium 135 - 145 mmol/L 136  138  139   Potassium 3.5 - 5.1 mmol/L 3.7  3.6  3.8   Chloride 98 - 111 mmol/L 104  105  107   CO2 22 - 32 mmol/L 26  24  25    Calcium 8.9 - 10.3 mg/dL 9.1  9.4  8.8   Total Protein 6.5 - 8.1 g/dL 7.4  7.5  7.1   Total Bilirubin 0.0 - 1.2 mg/dL 0.6  0.7  0.5   Alkaline Phos 38 - 126 U/L 85  82  71   AST 15 - 41 U/L 17  16  17    ALT 0 - 44 U/L 13  10  12       RADIOGRAPHIC STUDIES: I have personally reviewed the radiological images as listed and agreed with the findings in the report. No results found.

## 2024-02-08 NOTE — Assessment & Plan Note (Signed)
 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  Left upper lobe adenocarcinoma, No enough tissue for NGS, liquid biopsy showed actionable variants.   S/p concurrent chemotherapy radiation. -carboplatin  AUC 2 and taxol  45mg /m2  On Durvalumab  maintenance.   Clinically he is doing well today Labs are reviewed and discussed with patient. Proceed with Durvalumab  maintenance.

## 2024-02-08 NOTE — Patient Instructions (Signed)
 [facility] - A DEPT OF [facility]  Discharge Instructions: Thank you for choosing [facility] to provide your oncology and hematology care.  If you have a lab appointment with the Cancer Center, please go directly to the Cancer Center and check in at the registration area.  Wear comfortable clothing and clothing appropriate for easy access to any Portacath or PICC line.   We strive to give you quality time with your provider. You may need to reschedule your appointment if you arrive late (15 or more minutes).  Arriving late affects you and other patients whose appointments are after yours.  Also, if you miss three or more appointments without notifying the office, you may be dismissed from the clinic at the provider's discretion.      For prescription refill requests, have your pharmacy contact our office and allow 72 hours for refills to be completed.    Today you received the following chemotherapy and/or immunotherapy agents imfinzi      To help prevent nausea and vomiting after your treatment, we encourage you to take your nausea medication as directed.  BELOW ARE SYMPTOMS THAT SHOULD BE REPORTED IMMEDIATELY: *FEVER GREATER THAN 100.4 F (38 C) OR HIGHER *CHILLS OR SWEATING *NAUSEA AND VOMITING THAT IS NOT CONTROLLED WITH YOUR NAUSEA MEDICATION *UNUSUAL SHORTNESS OF BREATH *UNUSUAL BRUISING OR BLEEDING *URINARY PROBLEMS (pain or burning when urinating, or frequent urination) *BOWEL PROBLEMS (unusual diarrhea, constipation, pain near the anus) TENDERNESS IN MOUTH AND THROAT WITH OR WITHOUT PRESENCE OF ULCERS (sore throat, sores in mouth, or a toothache) UNUSUAL RASH, SWELLING OR PAIN  UNUSUAL VAGINAL DISCHARGE OR ITCHING   Items with * indicate a potential emergency and should be followed up as soon as possible or go to the Emergency Department if any problems should occur.  Please show the CHEMOTHERAPY ALERT CARD or IMMUNOTHERAPY  ALERT CARD at check-in to the Emergency Department and triage nurse.  Should you have questions after your visit or need to cancel or reschedule your appointment, please contact [facility] - A DEPT OF [facility]  [phone] and follow the prompts.  Office hours are 8:00 a.m. to 4:30 p.m. Monday - Friday. Please note that voicemails left after 4:00 p.m. may not be returned until the following business day.  We are closed weekends and major holidays. You have access to a nurse at all times for urgent questions. Please call the main number to the clinic [phone] and follow the prompts.  For any non-urgent questions, you may also contact your provider using MyChart. We now offer e-Visits for anyone 4 and older to request care online for non-urgent symptoms. For details visit [email or web address].   Also download the MyChart app! Go to the app store, search "MyChart", open the app, select [facility], and log in with your MyChart username and password.

## 2024-02-08 NOTE — Assessment & Plan Note (Signed)
 Immunotherapy plan as planned.

## 2024-02-08 NOTE — Assessment & Plan Note (Signed)
 Continue albuterol inhaler PRN, Duonebs.  follow up with pulmonology Dr. Meredeth Ide.  Continue Singulair, mucinex.

## 2024-02-08 NOTE — Assessment & Plan Note (Signed)
 Continue Elqiuis to 2.5mg  BID. He is on Plavix  75mg  daily.

## 2024-02-08 NOTE — Patient Instructions (Addendum)
 SABRA

## 2024-02-08 NOTE — Assessment & Plan Note (Addendum)
 Normalized. Hold potassium chloride  10meq daily,  Recommend patient to increase potassium rich food.

## 2024-02-11 LAB — T4: T4, Total: 4.6 ug/dL (ref 4.5–12.0)

## 2024-02-22 ENCOUNTER — Inpatient Hospital Stay

## 2024-02-22 ENCOUNTER — Encounter: Payer: Self-pay | Admitting: Oncology

## 2024-02-22 ENCOUNTER — Inpatient Hospital Stay (HOSPITAL_BASED_OUTPATIENT_CLINIC_OR_DEPARTMENT_OTHER): Admitting: Oncology

## 2024-02-22 VITALS — BP 129/92 | HR 82 | Temp 96.0°F | Resp 18 | Wt 215.6 lb

## 2024-02-22 VITALS — BP 151/89 | HR 86

## 2024-02-22 DIAGNOSIS — C3412 Malignant neoplasm of upper lobe, left bronchus or lung: Secondary | ICD-10-CM | POA: Diagnosis not present

## 2024-02-22 DIAGNOSIS — C3492 Malignant neoplasm of unspecified part of left bronchus or lung: Secondary | ICD-10-CM

## 2024-02-22 DIAGNOSIS — J449 Chronic obstructive pulmonary disease, unspecified: Secondary | ICD-10-CM | POA: Diagnosis not present

## 2024-02-22 DIAGNOSIS — Z5112 Encounter for antineoplastic immunotherapy: Secondary | ICD-10-CM

## 2024-02-22 DIAGNOSIS — I2699 Other pulmonary embolism without acute cor pulmonale: Secondary | ICD-10-CM | POA: Diagnosis not present

## 2024-02-22 LAB — TSH: TSH: 0.298 u[IU]/mL — ABNORMAL LOW (ref 0.350–4.500)

## 2024-02-22 LAB — CMP (CANCER CENTER ONLY)
ALT: 12 U/L (ref 0–44)
AST: 17 U/L (ref 15–41)
Albumin: 4.3 g/dL (ref 3.5–5.0)
Alkaline Phosphatase: 86 U/L (ref 38–126)
Anion gap: 8 (ref 5–15)
BUN: 9 mg/dL (ref 8–23)
CO2: 24 mmol/L (ref 22–32)
Calcium: 9.2 mg/dL (ref 8.9–10.3)
Chloride: 102 mmol/L (ref 98–111)
Creatinine: 0.7 mg/dL (ref 0.61–1.24)
GFR, Estimated: >60 mL/min (ref >60)
Glucose, Bld: 108 mg/dL — ABNORMAL HIGH (ref 70–99)
Potassium: 3.5 mmol/L (ref 3.5–5.1)
Sodium: 134 mmol/L — ABNORMAL LOW (ref 135–145)
Total Bilirubin: 0.6 mg/dL (ref 0.0–1.2)
Total Protein: 7.4 g/dL (ref 6.5–8.1)

## 2024-02-22 LAB — CBC WITH DIFFERENTIAL (CANCER CENTER ONLY)
Abs Immature Granulocytes: 0.01 K/uL (ref 0.00–0.07)
Basophils Absolute: 0 K/uL (ref 0.0–0.1)
Basophils Relative: 0 %
Eosinophils Absolute: 0.1 K/uL (ref 0.0–0.5)
Eosinophils Relative: 2 %
HCT: 37.6 % — ABNORMAL LOW (ref 39.0–52.0)
Hemoglobin: 12.1 g/dL — ABNORMAL LOW (ref 13.0–17.0)
Immature Granulocytes: 0 %
Lymphocytes Relative: 28 %
Lymphs Abs: 1.5 K/uL (ref 0.7–4.0)
MCH: 29.7 pg (ref 26.0–34.0)
MCHC: 32.2 g/dL (ref 30.0–36.0)
MCV: 92.4 fL (ref 80.0–100.0)
Monocytes Absolute: 0.6 K/uL (ref 0.1–1.0)
Monocytes Relative: 10 %
Neutro Abs: 3.2 K/uL (ref 1.7–7.7)
Neutrophils Relative %: 60 %
Platelet Count: 194 K/uL (ref 150–400)
RBC: 4.07 MIL/uL — ABNORMAL LOW (ref 4.22–5.81)
RDW: 14.2 % (ref 11.5–15.5)
WBC Count: 5.4 K/uL (ref 4.0–10.5)
nRBC: 0 % (ref 0.0–0.2)

## 2024-02-22 MED ORDER — SODIUM CHLORIDE 0.9 % IV SOLN
INTRAVENOUS | Status: DC
Start: 2024-02-22 — End: 2024-02-22
  Filled 2024-02-22: qty 250

## 2024-02-22 MED ORDER — SODIUM CHLORIDE 0.9 % IV SOLN
10.0000 mg/kg | Freq: Once | INTRAVENOUS | Status: AC
  Administered 2024-02-22: 1000 mg via INTRAVENOUS
  Filled 2024-02-22: qty 20

## 2024-02-22 NOTE — Progress Notes (Signed)
 Hematology/Oncology Progress note [phone] [phone]      CHIEF COMPLAINTS/PURPOSE OF CONSULTATION:  Stage III Lung cancer  ASSESSMENT & PLAN:   Cancer Staging  Adenocarcinoma of lung [facility]) Staging form: Lung, AJCC 8th Edition - Clinical stage from 03/22/2023: cT1, cN3, cM0 - Signed by Babara Call, MD on 03/22/2023   Adenocarcinoma of lung (HCC) Clinically he has Stage III left lung cancer He has right paratracheal lymph node hypermetabolic activity, although Station 7 lymph node showed atypical cell, no definite malignancy.  Left upper lobe adenocarcinoma, No enough tissue for NGS, liquid biopsy showed actionable variants.   S/p concurrent chemotherapy radiation. -carboplatin  AUC 2 and taxol  45mg /m2  On Durvalumab  maintenance.   Clinically he is doing well today Labs are reviewed and discussed with patient. Proceed with Durvalumab  maintenance.   COPD (chronic obstructive pulmonary disease) (HCC) Continue albuterol  inhaler PRN, Duonebs.  follow up with pulmonology Dr. Theotis.  Continue Singulair , mucinex .   Encounter for antineoplastic immunotherapy Immunotherapy plan as planned.   Pulmonary embolism (HCC) Continue Elqiuis to 2.5mg  BID. He is on Plavix  75mg  daily.    Orders Placed This Encounter  Procedures   CBC with Differential (Cancer Center Only)    Standing Status:   Future    Expected Date:   03/21/2024    Expiration Date:   03/21/2025   CMP (Cancer Center only)    Standing Status:   Future    Expected Date:   03/21/2024    Expiration Date:   03/21/2025   T4    Standing Status:   Future    Expected Date:   03/21/2024    Expiration Date:   03/21/2025   TSH    Standing Status:   Future    Expected Date:   03/21/2024    Expiration Date:   03/21/2025   CBC with Differential (Cancer Center Only)    Standing Status:   Future    Expected Date:   04/04/2024    Expiration Date:   04/04/2025   CMP (Cancer Center only)    Standing Status:   Future     Expected Date:   04/04/2024    Expiration Date:   04/04/2025   T4    Standing Status:   Future    Expected Date:   04/04/2024    Expiration Date:   04/04/2025   TSH    Standing Status:   Future    Expected Date:   04/04/2024    Expiration Date:   04/04/2025   Follow up  2 weeks Lab MD All questions were answered. The patient knows to call the clinic with any problems, questions or concerns.  Call Babara, MD, PhD [facility] Hematology Oncology 02/22/2024    HISTORY OF PRESENTING ILLNESS:  Senan Urey Mesler 63 y.o. male presents for follow up of lung cancer.  I have reviewed his chart and materials related to his cancer extensively and collaborated history with the patient. Summary of oncologic history is as follows:  12/08/2021 CT chest with contrast showed irregular solid pulmonary nodule of right upper lobe, increased in size.  Bibasilar consolidation.  Mildly enlarged right lower paratracheal lymph node.  Atherosclerosis. 01/04/2022 PET scan showed 10 mm irregular nodule in the lateral right upper lobe, suspicious for primary bronchogenic neoplasm. 2 small right paratracheal nodes, indeterminate and favored to be reactive.  Attention on follow-up.  Patient was seen by radiation oncology Dr. Lenn.  Patient was offered SBRT to the right upper lobe nodule/presumed non-small  cell lung cancer stage I.  Patient lives in a group home.  He reports chronic cough.  Some shortness of breath with exertion. He is a current everyday smoker.  Few cigarettes per day.  He denies any unintentional weight loss, night sweats, fever or chills. Patient is on aspirin  and Plavix    INTERVAL HISTORY Hoyt Leanos Chiasson is a 63 y.o. male who has above history reviewed by me today presents for follow up visit for lung cancer.  Oncology History  Adenocarcinoma of lung (HCC)  12/29/2022 Imaging   CT chest with contrast showed Stable radiation changes involving the right upper lobe with a small residual treated nodule.   Decrease in size.  Interval enlargement of the left upper lobe pulmonary nodule.  Stable mediastinal nodes stable advanced emphysematous changes and areas of pulmonary scarring.  Stable bilateral adrenal gland adenomas.  Emphysema   01/30/2023 Imaging   PET 1. Recurrent bronchogenic carcinoma in the left upper lobe with new and increasingly hypermetabolic mediastinal lymph nodes. No evidence of distant metastatic disease. 2. Possible mild prostate hypermetabolism. Consider laboratory correlation. 3. Liver is mildly heterogeneous, raising suspicion for steatosis. 4. Cholelithiasis. 5. Right adrenal adenoma.  Left adrenal myelolipoma. 6. Aortic atherosclerosis (ICD10-I70.0). Coronary artery calcification. 7. Enlarged pulmonic trunk, indicative of pulmonary arterial hypertension.   03/15/2023 Initial Diagnosis   Adenocarcinoma of left lung   Bronchoscopy showed  Lung, biopsy, Left upper lobe - NON-SMALL CELL CARCINOMA, FAVOR ADENOCARCINOMA.  1. Bronchial Lavage, Left upper lobe - POSITIVE FOR MALIGNANCY. - NON-SMALL CELL CARCINOMA, FAVOR ADENOCARCINOMA. - SEE NOTE. 2. Bronchial Brushing, Left upper lobe - POSITIVE FOR MALIGNANCY. - NON-SMALL CELL CARCINOMA, FAVOR ADENOCARCINOMA. 3. Bronchus, biopsy, left upper lobe - SEE [id number] 4. Fine Needle Aspiration, left upper lobe - POSITIVE FOR MALIGNANCY. - NON-SMALL CELL CARCINOMA, FAVOR ADENOCARCINOMA. 5. Fine Needle Aspiration, station 7 lymph node - ATYPICAL. - BACKGROUND LYMPHOID TISSUE IS PRESENT CONSISTENT WITH LYMPH NODE SAMPLING.    03/15/2023 Procedure   Fiberoptic bronchoscopy with airway inspection and BAL Procedure findings:   Bronchoscope was inserted via ETT  without difficulty.  Posterior oropharynx, epiglottis, arytenoids, false cords and vocal cords were not visualized as these were bypassed by endotracheal tube. The distal trachea was normal in circumference and appearance without mucosal, cartilaginous or branching  abnormalities.  The main carina was mildly splayed . All right and left lobar airways were visualized to the Subsegmental level.  Sub- sub segmental carinae were identified in all the distal airways.   Secretions were visible in the following airways and appeared to be clear.  The mucosa was : friable at LEFT UPPER LOBE   Airways were notable for:        exophytic lesions :n       extrinsic compression in the following distributions: n.       Friable mucosa: y       Teacher, music /pigmentation: n   MUCUS PLUGGING WAS SEVERE ON LEFT SIDE AND COMPLETE OBSTRUCTED MULTIPLE AIRWAYS.  UNABLE TO NAVIGATE THROUGH TRACHEOBRONCHIAL TREE DUE TO SEVERE MUCUS PLUGGING. THERAPEUTIC ASPIRATION WAS PERFORMED X 7 AND WAS DONE BILATERALLY ALTHOUGH WORSE ON LEFT.  BAL WAS PERFORMED AT LEFT UPPER LOBE X 2.     Endobronchial ultrasound assisted hilar and mediastinal lymph node biopsies procedure findings: The fiberoptic bronchoscope was removed and the EBUS scope was introduced. Examination began to evaluate for pathologically enlarged lymph nodes starting on the RIGHT  side progressing to the LEFT side.  All lymph node biopsies performed  with 21G  needle. Lymph node biopsies were sent in cytolite for all stations.   STATION 10R - 6mm not biopsied STATION 7 - 1.2cm biopsied 3 times STATION 10L - 5mm not biopsied  STATION 4L - 6mm not biopsied    03/22/2023 Cancer Staging   Staging form: Lung, AJCC 8th Edition - Clinical stage from 03/22/2023: cT1, cN3, cM0 - Signed by Babara Call, MD on 03/22/2023 Stage prefix: Initial diagnosis   04/20/2023 - 06/08/2023 Chemotherapy   Patient is on Treatment Plan : LUNG Carboplatin  + Paclitaxel  + XRT q7d     09/01/2023 -  Chemotherapy   Patient is on Treatment Plan : LUNG Durvalumab  (10) q14d     10/15/2023 - 10/24/2023 Hospital Admission   Hospitalized due to Influenza A, COPD exacerbation, acute respiratory failure.    11/03/2023 Imaging   CT chest abdomen pelvis w  contrast showed CHEST:   1. Stable LEFT upper lobe peribronchial lesion. 2. No evidence of metastatic adenopathy. 3. Stable small RIGHT paratracheal node. 4. Slight improvement in atelectasis in the RIGHT middle lobe.   PELVIS:   1. No evidence of metastatic disease in the abdomen pelvis. 2. Stable small hypodensity in the spleen and liver. 3. Stable benign RIGHT adrenal adenoma and and LEFT adrenal angiomyolipoma.      He reports feel well Chronic cough and SOB, he feels breathing status is at baseline today he is on Eliquis  2.5mg  BID and Plavix . No bleeding events.     MEDICAL HISTORY:  Past Medical History:  Diagnosis Date   Acute on chronic respiratory failure with hypoxia (HCC)    Asthma    Coagulation disorder (HCC)    COPD (chronic obstructive pulmonary disease) (HCC)    Depression    History of hiatal hernia    Hypertension    Hypokalemia    Leukocytosis    Lung mass    Normocytic anemia 07/13/2023   Pneumonia    Schizophrenia (HCC)    Shortness of breath dyspnea    Stroke [facility])    Umbilical hernia    Ventral hernia     SURGICAL HISTORY: Past Surgical History:  Procedure Laterality Date   COLONOSCOPY WITH PROPOFOL  N/A 09/21/2021   Procedure: COLONOSCOPY WITH PROPOFOL ;  Surgeon: Maryruth Ole DASEN, MD;  Location: [facility] ENDOSCOPY;  Service: Endoscopy;  Laterality: N/A;   HERNIA REPAIR     INSERTION OF MESH N/A 12/18/2014   Procedure: INSERTION OF MESH;  Surgeon: Charlie FORBES Fell, MD;  Location: [facility] ORS;  Service: General;  Laterality: N/A;   SUPRA-UMBILICAL HERNIA  12/18/2014   Procedure: SUPRA-UMBILICAL HERNIA;  Surgeon: Charlie FORBES Fell, MD;  Location: [facility] ORS;  Service: General;;   UMBILICAL HERNIA REPAIR N/A 12/18/2014   Procedure: HERNIA REPAIR UMBILICAL ADULT;  Surgeon: Charlie FORBES Fell, MD;  Location: [facility] ORS;  Service: General;  Laterality: N/A;   VIDEO BRONCHOSCOPY WITH ENDOBRONCHIAL ULTRASOUND N/A 03/15/2023   Procedure: VIDEO BRONCHOSCOPY WITH  ENDOBRONCHIAL ULTRASOUND;  Surgeon: Parris Manna, MD;  Location: [facility] ORS;  Service: Thoracic;  Laterality: N/A;    SOCIAL HISTORY: Social History   Socioeconomic History   Marital status: Widowed    Spouse name: Not on file   Number of children: Not on file   Years of education: Not on file   Highest education level: Not on file  Occupational History   Not on file  Tobacco Use   Smoking status: Former    Current packs/day: 0.15    Average packs/day: 0.2 packs/day for  45.0 years (6.8 ttl pk-yrs)    Types: Cigarettes   Smokeless tobacco: Never  Vaping Use   Vaping status: Never Used  Substance and Sexual Activity   Alcohol use: Not Currently    Alcohol/week: 75.0 standard drinks of alcohol    Types: 75 Cans of beer per week   Drug use: Not Currently    Types: Marijuana, Crack cocaine    Comment: none in 15 yrs   Sexual activity: Not Currently  Other Topics Concern   Not on file  Social History Narrative   Not on file   Social Drivers of Health   Financial Resource Strain: Medium Risk (08/18/2023)   Received from [facility]   Overall Financial Resource Strain (CARDIA)    Difficulty of Paying Living Expenses: Somewhat hard  Food Insecurity: No Food Insecurity (10/17/2023)   Hunger Vital Sign    Worried About Running Out of Food in the Last Year: Never true    Ran Out of Food in the Last Year: Never true  Transportation Needs: No Transportation Needs (10/17/2023)   PRAPARE - Administrator, Civil Service (Medical): No    Lack of Transportation (Non-Medical): No  Physical Activity: Not on file  Stress: Not on file  Social Connections: Not on file  Intimate Partner Violence: Not At Risk (10/17/2023)   Humiliation, Afraid, Rape, and Kick questionnaire    Fear of Current or Ex-Partner: No    Emotionally Abused: No    Physically Abused: No    Sexually Abused: No    FAMILY HISTORY: Family History  Problem Relation Age of Onset    Asthma Mother    Hypertension Father     ALLERGIES:  has no known allergies.  MEDICATIONS:  Current Outpatient Medications  Medication Sig Dispense Refill   albuterol  (VENTOLIN  HFA) 108 (90 Base) MCG/ACT inhaler Inhale 1-2 puffs into the lungs every 6 (six) hours as needed for wheezing or shortness of breath. 8 g 0   apixaban  (ELIQUIS ) 2.5 MG TABS tablet Take 1 tablet (2.5 mg total) by mouth 2 (two) times daily. 60 tablet 3   clopidogrel  (PLAVIX ) 75 MG tablet Take 1 tablet (75 mg total) by mouth daily. 30 tablet 0   famotidine  (PEPCID ) 20 MG tablet Take 1 tablet (20 mg total) by mouth daily. 30 tablet 0   FLUoxetine  (PROZAC ) 20 MG capsule Take 1 capsule (20 mg total) by mouth daily. 30 capsule 3   Fluticasone -Umeclidin-Vilant (TRELEGY ELLIPTA ) 100-62.5-25 MCG/ACT AEPB Inhale 1 puff into the lungs daily. 28 each 1   ipratropium-albuterol  (DUONEB) 0.5-2.5 (3) MG/3ML SOLN Take 3 mLs by nebulization 3 (three) times daily. 360 mL 1   mirtazapine  (REMERON ) 7.5 MG tablet Take 1 tablet (7.5 mg total) by mouth at bedtime. 30 tablet 5   montelukast  (SINGULAIR ) 10 MG tablet Take 1 tablet (10 mg total) by mouth at bedtime. 30 tablet 1   potassium chloride  (KLOR-CON  M) 10 MEQ tablet Take 1 tablet (10 mEq total) by mouth daily. 30 tablet 1   pravastatin  (PRAVACHOL ) 20 MG tablet Take 1 tablet (20 mg total) by mouth daily at 6 PM. 30 tablet 0   QUEtiapine  (SEROQUEL ) 300 MG tablet Take 2 tablets (600 mg total) by mouth at bedtime. 60 tablet 0   senna-docusate (SENOKOT-S) 8.6-50 MG tablet Take 1 tablet by mouth daily after breakfast. 30 tablet 0   traZODone  (DESYREL ) 100 MG tablet Take 2 tablets (200 mg total) by mouth at bedtime as needed for  sleep. 30 tablet 0   guaiFENesin  (MUCINEX ) 600 MG 12 hr tablet Take 1 tablet (600 mg total) by mouth 2 (two) times daily. (Patient not taking: Reported on 02/22/2024) 30 tablet 1   No current facility-administered medications for this visit.    Review of Systems   Constitutional:  Negative for appetite change, chills, fatigue, fever and unexpected weight change.  HENT:   Negative for hearing loss and voice change.   Eyes:  Negative for eye problems and icterus.  Respiratory:  Negative for chest tightness, cough and hemoptysis.   Cardiovascular:  Negative for chest pain and leg swelling.  Gastrointestinal:  Negative for abdominal distention and abdominal pain.  Endocrine: Negative for hot flashes.  Genitourinary:  Negative for difficulty urinating, dysuria and frequency.   Musculoskeletal:  Negative for arthralgias.  Skin:  Negative for itching and rash.  Neurological:  Negative for light-headedness and numbness.  Hematological:  Negative for adenopathy. Does not bruise/bleed easily.  Psychiatric/Behavioral:  Negative for confusion.      PHYSICAL EXAMINATION: ECOG PERFORMANCE STATUS: 1 - Symptomatic but completely ambulatory  Vitals:   02/22/24 1053 02/22/24 1058  BP: (!) 144/97 (!) 129/92  Pulse: 82   Resp: 18   Temp: (!) 96 F (35.6 C)   SpO2: 98%    Filed Weights   02/22/24 1053  Weight: 215 lb 9.6 oz (97.8 kg)    Physical Exam Constitutional:      General: He is not in acute distress.    Appearance: He is not diaphoretic.  HENT:     Head: Normocephalic and atraumatic.  Eyes:     General: No scleral icterus. Cardiovascular:     Rate and Rhythm: Normal rate and regular rhythm.  Pulmonary:     Effort: Pulmonary effort is normal. No respiratory distress.     Breath sounds: No wheezing.     Comments: Decreased breath sound bilaterally, poor air entry Abdominal:     General: There is no distension.     Palpations: Abdomen is soft.     Tenderness: There is no abdominal tenderness.  Musculoskeletal:        General: Normal range of motion.     Cervical back: Normal range of motion and neck supple.  Skin:    General: Skin is warm and dry.     Findings: No erythema.  Neurological:     Mental Status: He is alert and oriented  to person, place, and time. Mental status is at baseline.     Motor: No abnormal muscle tone.  Psychiatric:        Mood and Affect: Affect normal.     Comments: Flat      LABORATORY DATA:  I have reviewed the data as listed    Latest Ref Rng & Units 02/22/2024   10:37 AM 02/08/2024    9:00 AM 01/25/2024    8:20 AM  CBC  WBC 4.0 - 10.5 K/uL 5.4  5.3  4.6   Hemoglobin 13.0 - 17.0 g/dL 87.8  87.6  87.5   Hematocrit 39.0 - 52.0 % 37.6  38.0  38.2   Platelets 150 - 400 K/uL 194  202  182       Latest Ref Rng & Units 02/22/2024   10:37 AM 02/08/2024    9:00 AM 01/25/2024    8:20 AM  CMP  Glucose 70 - 99 mg/dL 891  892  890   BUN 8 - 23 mg/dL 9  6  8  Creatinine 0.61 - 1.24 mg/dL 9.29  9.23  9.35   Sodium 135 - 145 mmol/L 134  136  138   Potassium 3.5 - 5.1 mmol/L 3.5  3.7  3.6   Chloride 98 - 111 mmol/L 102  104  105   CO2 22 - 32 mmol/L 24  26  24    Calcium 8.9 - 10.3 mg/dL 9.2  9.1  9.4   Total Protein 6.5 - 8.1 g/dL 7.4  7.4  7.5   Total Bilirubin 0.0 - 1.2 mg/dL 0.6  0.6  0.7   Alkaline Phos 38 - 126 U/L 86  85  82   AST 15 - 41 U/L 17  17  16    ALT 0 - 44 U/L 12  13  10       RADIOGRAPHIC STUDIES: I have personally reviewed the radiological images as listed and agreed with the findings in the report. No results found.

## 2024-02-22 NOTE — Assessment & Plan Note (Signed)
 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  Left upper lobe adenocarcinoma, No enough tissue for NGS, liquid biopsy showed actionable variants.   S/p concurrent chemotherapy radiation. -carboplatin  AUC 2 and taxol  45mg /m2  On Durvalumab  maintenance.   Clinically he is doing well today Labs are reviewed and discussed with patient. Proceed with Durvalumab  maintenance.

## 2024-02-22 NOTE — Assessment & Plan Note (Signed)
 Continue albuterol inhaler PRN, Duonebs.  follow up with pulmonology Dr. Meredeth Ide.  Continue Singulair, mucinex.

## 2024-02-22 NOTE — Assessment & Plan Note (Signed)
 Continue Elqiuis to 2.5mg  BID. He is on Plavix  75mg  daily.

## 2024-02-22 NOTE — Assessment & Plan Note (Signed)
 Immunotherapy plan as planned.

## 2024-02-23 LAB — T4: T4, Total: 5.3 ug/dL (ref 4.5–12.0)

## 2024-02-27 ENCOUNTER — Ambulatory Visit: Payer: Self-pay | Admitting: Oncology

## 2024-02-27 ENCOUNTER — Ambulatory Visit
Admission: RE | Admit: 2024-02-27 | Discharge: 2024-02-27 | Disposition: A | Discharge status: Home / Self Care | Source: Ambulatory Visit | Attending: Oncology | Admitting: Oncology

## 2024-02-27 DIAGNOSIS — C3492 Malignant neoplasm of unspecified part of left bronchus or lung: Secondary | ICD-10-CM | POA: Insufficient documentation

## 2024-02-27 MED ORDER — IOHEXOL 300 MG/ML  SOLN
75.0000 mL | Freq: Once | INTRAMUSCULAR | Status: AC | PRN
  Administered 2024-02-27: 75 mL via INTRAVENOUS

## 2024-03-07 ENCOUNTER — Inpatient Hospital Stay (HOSPITAL_BASED_OUTPATIENT_CLINIC_OR_DEPARTMENT_OTHER): Admitting: Oncology

## 2024-03-07 ENCOUNTER — Encounter: Payer: Self-pay | Admitting: Oncology

## 2024-03-07 ENCOUNTER — Inpatient Hospital Stay

## 2024-03-07 VITALS — BP 146/102 | HR 85 | Temp 96.9°F | Wt 215.7 lb

## 2024-03-07 VITALS — BP 137/92 | HR 87 | Resp 18

## 2024-03-07 DIAGNOSIS — Z5112 Encounter for antineoplastic immunotherapy: Secondary | ICD-10-CM

## 2024-03-07 DIAGNOSIS — C3411 Malignant neoplasm of upper lobe, right bronchus or lung: Secondary | ICD-10-CM | POA: Diagnosis not present

## 2024-03-07 DIAGNOSIS — C3492 Malignant neoplasm of unspecified part of left bronchus or lung: Secondary | ICD-10-CM

## 2024-03-07 DIAGNOSIS — I2699 Other pulmonary embolism without acute cor pulmonale: Secondary | ICD-10-CM

## 2024-03-07 DIAGNOSIS — J449 Chronic obstructive pulmonary disease, unspecified: Secondary | ICD-10-CM

## 2024-03-07 DIAGNOSIS — C3412 Malignant neoplasm of upper lobe, left bronchus or lung: Secondary | ICD-10-CM | POA: Diagnosis not present

## 2024-03-07 LAB — CMP (CANCER CENTER ONLY)
ALT: 12 U/L (ref 0–44)
AST: 17 U/L (ref 15–41)
Albumin: 3.8 g/dL (ref 3.5–5.0)
Alkaline Phosphatase: 82 U/L (ref 38–126)
Anion gap: 7 (ref 5–15)
BUN: 8 mg/dL (ref 8–23)
CO2: 23 mmol/L (ref 22–32)
Calcium: 9 mg/dL (ref 8.9–10.3)
Chloride: 107 mmol/L (ref 98–111)
Creatinine: 0.72 mg/dL (ref 0.61–1.24)
GFR, Estimated: >60 mL/min (ref >60)
Glucose, Bld: 104 mg/dL — ABNORMAL HIGH (ref 70–99)
Potassium: 3.8 mmol/L (ref 3.5–5.1)
Sodium: 137 mmol/L (ref 135–145)
Total Bilirubin: 0.6 mg/dL (ref 0.0–1.2)
Total Protein: 6.9 g/dL (ref 6.5–8.1)

## 2024-03-07 LAB — CBC WITH DIFFERENTIAL (CANCER CENTER ONLY)
Abs Immature Granulocytes: 0.01 K/uL (ref 0.00–0.07)
Basophils Absolute: 0 K/uL (ref 0.0–0.1)
Basophils Relative: 0 %
Eosinophils Absolute: 0.1 K/uL (ref 0.0–0.5)
Eosinophils Relative: 2 %
HCT: 36.1 % — ABNORMAL LOW (ref 39.0–52.0)
Hemoglobin: 11.9 g/dL — ABNORMAL LOW (ref 13.0–17.0)
Immature Granulocytes: 0 %
Lymphocytes Relative: 28 %
Lymphs Abs: 1.4 K/uL (ref 0.7–4.0)
MCH: 29.7 pg (ref 26.0–34.0)
MCHC: 33 g/dL (ref 30.0–36.0)
MCV: 90 fL (ref 80.0–100.0)
Monocytes Absolute: 0.4 K/uL (ref 0.1–1.0)
Monocytes Relative: 8 %
Neutro Abs: 3.1 K/uL (ref 1.7–7.7)
Neutrophils Relative %: 62 %
Platelet Count: 177 K/uL (ref 150–400)
RBC: 4.01 MIL/uL — ABNORMAL LOW (ref 4.22–5.81)
RDW: 13.8 % (ref 11.5–15.5)
WBC Count: 5.1 K/uL (ref 4.0–10.5)
nRBC: 0 % (ref 0.0–0.2)

## 2024-03-07 MED ORDER — APIXABAN 2.5 MG PO TABS: 2.5000 mg | ORAL_TABLET | Freq: Two times a day (BID) | ORAL | 3 refills | Status: DC

## 2024-03-07 MED ORDER — SODIUM CHLORIDE 0.9 % IV SOLN
10.0000 mg/kg | Freq: Once | INTRAVENOUS | Status: AC
  Administered 2024-03-07: 1000 mg via INTRAVENOUS
  Filled 2024-03-07: qty 20

## 2024-03-07 MED ORDER — SODIUM CHLORIDE 0.9 % IV SOLN
INTRAVENOUS | Status: DC
  Filled 2024-03-07: qty 250

## 2024-03-07 NOTE — Assessment & Plan Note (Signed)
 Continue Elqiuis to 2.5mg  BID. He is on Plavix  75mg  daily.

## 2024-03-07 NOTE — Assessment & Plan Note (Signed)
 Immunotherapy plan as planned.

## 2024-03-07 NOTE — Progress Notes (Signed)
 Hematology/Oncology Progress note [phone] Fax:([phone]      CHIEF COMPLAINTS/PURPOSE OF CONSULTATION:  Stage III Lung cancer  ASSESSMENT & PLAN:   Cancer Staging  Adenocarcinoma of lung [facility]) Staging form: Lung, AJCC 8th Edition - Clinical stage from 03/22/2023: cT1, cN3, cM0 - Signed by Babara Call, MD on 03/22/2023   Adenocarcinoma of lung (HCC) Clinically he has Stage III left lung cancer He has right paratracheal lymph node hypermetabolic activity, although Station 7 lymph node showed atypical cell, no definite malignancy.  Left upper lobe adenocarcinoma, No enough tissue for NGS, liquid biopsy showed actionable variants.   S/p concurrent chemotherapy radiation. -carboplatin  AUC 2 and taxol  45mg /m2  On Durvalumab  maintenance.   Clinically he is doing well today Labs are reviewed and discussed with patient. Proceed with Durvalumab  maintenance.   CT scan showed stable treated left upper lobe lesion, however interval increase of right middle lobe lesion. Obtain PET scan for further evaluation. If PET positive, refer to Radonc for SBRT.   COPD (chronic obstructive pulmonary disease) (HCC) Continue albuterol  inhaler PRN, Duonebs.  follow up with pulmonology Dr. Theotis.  Continue Singulair , mucinex .   Encounter for antineoplastic immunotherapy Immunotherapy plan as planned.   Pulmonary embolism (HCC) Continue Elqiuis to 2.5mg  BID. He is on Plavix  75mg  daily.    Orders Placed This Encounter  Procedures   NM PET Image Restag (PS) Skull Base To Thigh    Standing Status:   Future    Expected Date:   03/12/2024    Expiration Date:   03/07/2025    If indicated for the ordered procedure, I authorize the administration of a radiopharmaceutical per Radiology protocol:   Yes    Preferred imaging location?:   [facility]   Follow up  TBD All questions were answered. The patient knows to call the clinic with any problems, questions or concerns.  Call Babara,  MD, PhD [facility] Hematology Oncology 03/07/2024    HISTORY OF PRESENTING ILLNESS:  [name] 63 y.o. male presents for follow up of lung cancer.  I have reviewed his chart and materials related to his cancer extensively and collaborated history with the patient. Summary of oncologic history is as follows:  12/08/2021 CT chest with contrast showed irregular solid pulmonary nodule of right upper lobe, increased in size.  Bibasilar consolidation.  Mildly enlarged right lower paratracheal lymph node.  Atherosclerosis. 01/04/2022 PET scan showed 10 mm irregular nodule in the lateral right upper lobe, suspicious for primary bronchogenic neoplasm. 2 small right paratracheal nodes, indeterminate and favored to be reactive.  Attention on follow-up.  Patient was seen by radiation oncology Dr. Lenn.  Patient was offered SBRT to the right upper lobe nodule/presumed non-small cell lung cancer stage I.  Patient lives in a group home.  He reports chronic cough.  Some shortness of breath with exertion. He is a current everyday smoker.  Few cigarettes per day.  He denies any unintentional weight loss, night sweats, fever or chills. Patient is on aspirin  and Plavix    INTERVAL HISTORY [name] is a 63 y.o. male who has above history reviewed by me today presents for follow up visit for lung cancer.  Oncology History  Adenocarcinoma of lung (HCC)  12/29/2022 Imaging   CT chest with contrast showed Stable radiation changes involving the right upper lobe with a small residual treated nodule.  Decrease in size.  Interval enlargement of the left upper lobe pulmonary nodule.  Stable mediastinal nodes stable advanced emphysematous changes  and areas of pulmonary scarring.  Stable bilateral adrenal gland adenomas.  Emphysema   01/30/2023 Imaging   PET 1. Recurrent bronchogenic carcinoma in the left upper lobe with new and increasingly hypermetabolic mediastinal lymph nodes. No evidence of distant  metastatic disease. 2. Possible mild prostate hypermetabolism. Consider laboratory correlation. 3. Liver is mildly heterogeneous, raising suspicion for steatosis. 4. Cholelithiasis. 5. Right adrenal adenoma.  Left adrenal myelolipoma. 6. Aortic atherosclerosis (ICD10-I70.0). Coronary artery calcification. 7. Enlarged pulmonic trunk, indicative of pulmonary arterial hypertension.   03/15/2023 Initial Diagnosis   Adenocarcinoma of left lung   Bronchoscopy showed  Lung, biopsy, Left upper lobe - NON-SMALL CELL CARCINOMA, FAVOR ADENOCARCINOMA.  1. Bronchial Lavage, Left upper lobe - POSITIVE FOR MALIGNANCY. - NON-SMALL CELL CARCINOMA, FAVOR ADENOCARCINOMA. - SEE NOTE. 2. Bronchial Brushing, Left upper lobe - POSITIVE FOR MALIGNANCY. - NON-SMALL CELL CARCINOMA, FAVOR ADENOCARCINOMA. 3. Bronchus, biopsy, left upper lobe - SEE [id number] 4. Fine Needle Aspiration, left upper lobe - POSITIVE FOR MALIGNANCY. - NON-SMALL CELL CARCINOMA, FAVOR ADENOCARCINOMA. 5. Fine Needle Aspiration, station 7 lymph node - ATYPICAL. - BACKGROUND LYMPHOID TISSUE IS PRESENT CONSISTENT WITH LYMPH NODE SAMPLING.    03/15/2023 Procedure   Fiberoptic bronchoscopy with airway inspection and BAL Procedure findings:   Bronchoscope was inserted via ETT  without difficulty.  Posterior oropharynx, epiglottis, arytenoids, false cords and vocal cords were not visualized as these were bypassed by endotracheal tube. The distal trachea was normal in circumference and appearance without mucosal, cartilaginous or branching abnormalities.  The main carina was mildly splayed . All right and left lobar airways were visualized to the Subsegmental level.  Sub- sub segmental carinae were identified in all the distal airways.   Secretions were visible in the following airways and appeared to be clear.  The mucosa was : friable at LEFT UPPER LOBE   Airways were notable for:        exophytic lesions :n       extrinsic  compression in the following distributions: n.       Friable mucosa: y       Teacher, music /pigmentation: n   MUCUS PLUGGING WAS SEVERE ON LEFT SIDE AND COMPLETE OBSTRUCTED MULTIPLE AIRWAYS.  UNABLE TO NAVIGATE THROUGH TRACHEOBRONCHIAL TREE DUE TO SEVERE MUCUS PLUGGING. THERAPEUTIC ASPIRATION WAS PERFORMED X 7 AND WAS DONE BILATERALLY ALTHOUGH WORSE ON LEFT.  BAL WAS PERFORMED AT LEFT UPPER LOBE X 2.     Endobronchial ultrasound assisted hilar and mediastinal lymph node biopsies procedure findings: The fiberoptic bronchoscope was removed and the EBUS scope was introduced. Examination began to evaluate for pathologically enlarged lymph nodes starting on the RIGHT  side progressing to the LEFT side.  All lymph node biopsies performed with 21G  needle. Lymph node biopsies were sent in cytolite for all stations.   STATION 10R - 6mm not biopsied STATION 7 - 1.2cm biopsied 3 times STATION 10L - 5mm not biopsied  STATION 4L - 6mm not biopsied    03/22/2023 Cancer Staging   Staging form: Lung, AJCC 8th Edition - Clinical stage from 03/22/2023: cT1, cN3, cM0 - Signed by Babara Call, MD on 03/22/2023 Stage prefix: Initial diagnosis   04/20/2023 - 06/08/2023 Chemotherapy   Patient is on Treatment Plan : LUNG Carboplatin  + Paclitaxel  + XRT q7d     09/01/2023 -  Chemotherapy   Patient is on Treatment Plan : LUNG Durvalumab  (10) q14d     10/15/2023 - 10/24/2023 Hospital Admission   Hospitalized due to Influenza A, COPD exacerbation,  acute respiratory failure.    11/03/2023 Imaging   CT chest abdomen pelvis w contrast showed CHEST:   1. Stable LEFT upper lobe peribronchial lesion. 2. No evidence of metastatic adenopathy. 3. Stable small RIGHT paratracheal node. 4. Slight improvement in atelectasis in the RIGHT middle lobe.   PELVIS:   1. No evidence of metastatic disease in the abdomen pelvis. 2. Stable small hypodensity in the spleen and liver. 3. Stable benign RIGHT adrenal adenoma and and  LEFT adrenal angiomyolipoma.    02/27/2024 Imaging   CT chest w contrast   1. Unchanged spiculated nodule of the central left upper lobe measuring 1.9 x 1.0 cm, consistent with treated primary lung malignancy. 2. Interval enlargement in a lobulated nodule of the lateral segment right middle lobe encasing a small bronchial, measuring 1.2 x 0.8 cm, previously 0.8 x 0.6 cm. This is highly suspicious for synchronous malignancy or metastasis. Given size this is likely amenable to PET-CT characterization. 3. Unchanged bandlike ground-glass in the peripheral right upper lobe. 4. Unchanged right paratracheal lymph node measuring 1.1 x 1.1 cm. No other enlarged mediastinal, hilar, or axillary lymph nodes. 5. Emphysema and diffuse bilateral bronchial wall thickening. 6. Enlargement of the main pulmonary artery, as can be seen in pulmonary hypertension. 7. Coronary artery disease.     He reports feel well Chronic cough and SOB, he feels breathing status is at baseline today he is on Eliquis  2.5mg  BID and Plavix . No bleeding events.     MEDICAL HISTORY:  Past Medical History:  Diagnosis Date   Acute on chronic respiratory failure with hypoxia (HCC)    Asthma    Coagulation disorder (HCC)    COPD (chronic obstructive pulmonary disease) (HCC)    Depression    History of hiatal hernia    Hypertension    Hypokalemia    Leukocytosis    Lung mass    Normocytic anemia 07/13/2023   Pneumonia    Schizophrenia (HCC)    Shortness of breath dyspnea    Stroke [facility])    Umbilical hernia    Ventral hernia     SURGICAL HISTORY: Past Surgical History:  Procedure Laterality Date   COLONOSCOPY WITH PROPOFOL  N/A 09/21/2021   Procedure: COLONOSCOPY WITH PROPOFOL ;  Surgeon: Maryruth Ole DASEN, MD;  Location: [facility] ENDOSCOPY;  Service: Endoscopy;  Laterality: N/A;   HERNIA REPAIR     INSERTION OF MESH N/A 12/18/2014   Procedure: INSERTION OF MESH;  Surgeon: Charlie FORBES Fell, MD;  Location: [facility]  ORS;  Service: General;  Laterality: N/A;   SUPRA-UMBILICAL HERNIA  12/18/2014   Procedure: SUPRA-UMBILICAL HERNIA;  Surgeon: Charlie FORBES Fell, MD;  Location: [facility] ORS;  Service: General;;   UMBILICAL HERNIA REPAIR N/A 12/18/2014   Procedure: HERNIA REPAIR UMBILICAL ADULT;  Surgeon: Charlie FORBES Fell, MD;  Location: [facility] ORS;  Service: General;  Laterality: N/A;   VIDEO BRONCHOSCOPY WITH ENDOBRONCHIAL ULTRASOUND N/A 03/15/2023   Procedure: VIDEO BRONCHOSCOPY WITH ENDOBRONCHIAL ULTRASOUND;  Surgeon: Parris Manna, MD;  Location: [facility] ORS;  Service: Thoracic;  Laterality: N/A;    SOCIAL HISTORY: Social History   Socioeconomic History   Marital status: Widowed    Spouse name: Not on file   Number of children: Not on file   Years of education: Not on file   Highest education level: Not on file  Occupational History   Not on file  Tobacco Use   Smoking status: Former    Current packs/day: 0.15    Average packs/day: 0.2 packs/day for  45.0 years (6.8 ttl pk-yrs)    Types: Cigarettes   Smokeless tobacco: Never  Vaping Use   Vaping status: Never Used  Substance and Sexual Activity   Alcohol use: Not Currently    Alcohol/week: 75.0 standard drinks of alcohol    Types: 75 Cans of beer per week   Drug use: Not Currently    Types: Marijuana, Crack cocaine    Comment: none in 15 yrs   Sexual activity: Not Currently  Other Topics Concern   Not on file  Social History Narrative   Not on file   Social Drivers of Health   Financial Resource Strain: Medium Risk (08/18/2023)   Received from [facility]   Overall Financial Resource Strain (CARDIA)    Difficulty of Paying Living Expenses: Somewhat hard  Food Insecurity: No Food Insecurity (10/17/2023)   Hunger Vital Sign    Worried About Running Out of Food in the Last Year: Never true    Ran Out of Food in the Last Year: Never true  Transportation Needs: No Transportation Needs (10/17/2023)   PRAPARE - Therapist, art (Medical): No    Lack of Transportation (Non-Medical): No  Physical Activity: Not on file  Stress: Not on file  Social Connections: Not on file  Intimate Partner Violence: Not At Risk (10/17/2023)   Humiliation, Afraid, Rape, and Kick questionnaire    Fear of Current or Ex-Partner: No    Emotionally Abused: No    Physically Abused: No    Sexually Abused: No    FAMILY HISTORY: Family History  Problem Relation Age of Onset   Asthma Mother    Hypertension Father     ALLERGIES:  has no known allergies.  MEDICATIONS:  Current Outpatient Medications  Medication Sig Dispense Refill   albuterol  (VENTOLIN  HFA) 108 (90 Base) MCG/ACT inhaler Inhale 1-2 puffs into the lungs every 6 (six) hours as needed for wheezing or shortness of breath. 8 g 0   clopidogrel  (PLAVIX ) 75 MG tablet Take 1 tablet (75 mg total) by mouth daily. 30 tablet 0   famotidine  (PEPCID ) 20 MG tablet Take 1 tablet (20 mg total) by mouth daily. 30 tablet 0   FLUoxetine  (PROZAC ) 20 MG capsule Take 1 capsule (20 mg total) by mouth daily. 30 capsule 3   Fluticasone -Umeclidin-Vilant (TRELEGY ELLIPTA ) 100-62.5-25 MCG/ACT AEPB Inhale 1 puff into the lungs daily. 28 each 1   ipratropium-albuterol  (DUONEB) 0.5-2.5 (3) MG/3ML SOLN Take 3 mLs by nebulization 3 (three) times daily. 360 mL 1   mirtazapine  (REMERON ) 7.5 MG tablet Take 1 tablet (7.5 mg total) by mouth at bedtime. 30 tablet 5   montelukast  (SINGULAIR ) 10 MG tablet Take 1 tablet (10 mg total) by mouth at bedtime. 30 tablet 1   potassium chloride  (KLOR-CON  M) 10 MEQ tablet Take 1 tablet (10 mEq total) by mouth daily. 30 tablet 1   pravastatin  (PRAVACHOL ) 20 MG tablet Take 1 tablet (20 mg total) by mouth daily at 6 PM. 30 tablet 0   QUEtiapine  (SEROQUEL ) 300 MG tablet Take 2 tablets (600 mg total) by mouth at bedtime. 60 tablet 0   senna-docusate (SENOKOT-S) 8.6-50 MG tablet Take 1 tablet by mouth daily after breakfast. 30 tablet 0   traZODone   (DESYREL ) 100 MG tablet Take 2 tablets (200 mg total) by mouth at bedtime as needed for sleep. 30 tablet 0   apixaban  (ELIQUIS ) 2.5 MG TABS tablet Take 1 tablet (2.5 mg total) by mouth 2 (two)  times daily. 60 tablet 3   guaiFENesin  (MUCINEX ) 600 MG 12 hr tablet Take 1 tablet (600 mg total) by mouth 2 (two) times daily. (Patient not taking: Reported on 03/07/2024) 30 tablet 1   No current facility-administered medications for this visit.   Facility-Administered Medications Ordered in Other Visits  Medication Dose Route Frequency Provider Last Rate Last Admin   0.9 %  sodium chloride  infusion   Intravenous Continuous Babara Call, MD 10 mL/hr at 03/07/24 1006 New Bag at 03/07/24 1006   durvalumab  (IMFINZI ) 1,000 mg in sodium chloride  0.9 % 100 mL chemo infusion  10 mg/kg (Treatment Plan Recorded) Intravenous Once Babara Call, MD 120 mL/hr at 03/07/24 1020 1,000 mg at 03/07/24 1020    Review of Systems  Constitutional:  Negative for appetite change, chills, fatigue, fever and unexpected weight change.  HENT:   Negative for hearing loss and voice change.   Eyes:  Negative for eye problems and icterus.  Respiratory:  Negative for chest tightness, cough and hemoptysis.   Cardiovascular:  Negative for chest pain and leg swelling.  Gastrointestinal:  Negative for abdominal distention and abdominal pain.  Endocrine: Negative for hot flashes.  Genitourinary:  Negative for difficulty urinating, dysuria and frequency.   Musculoskeletal:  Negative for arthralgias.  Skin:  Negative for itching and rash.  Neurological:  Negative for light-headedness and numbness.  Hematological:  Negative for adenopathy. Does not bruise/bleed easily.  Psychiatric/Behavioral:  Negative for confusion.      PHYSICAL EXAMINATION: ECOG PERFORMANCE STATUS: 1 - Symptomatic but completely ambulatory  Vitals:   03/07/24 0915 03/07/24 0922  BP: (!) 139/119 (!) 146/102  Pulse: 85   Temp: (!) 96.9 F (36.1 C)   SpO2: 96%     Filed Weights   03/07/24 0915  Weight: 215 lb 11.2 oz (97.8 kg)    Physical Exam Constitutional:      General: He is not in acute distress.    Appearance: He is not diaphoretic.  HENT:     Head: Normocephalic and atraumatic.  Eyes:     General: No scleral icterus. Cardiovascular:     Rate and Rhythm: Normal rate and regular rhythm.  Pulmonary:     Effort: Pulmonary effort is normal. No respiratory distress.     Breath sounds: No wheezing.     Comments: Decreased breath sound bilaterally, poor air entry Abdominal:     General: There is no distension.     Palpations: Abdomen is soft.     Tenderness: There is no abdominal tenderness.  Musculoskeletal:        General: Normal range of motion.     Cervical back: Normal range of motion and neck supple.  Skin:    General: Skin is warm and dry.     Findings: No erythema.  Neurological:     Mental Status: He is alert and oriented to person, place, and time. Mental status is at baseline.     Motor: No abnormal muscle tone.  Psychiatric:        Mood and Affect: Affect normal.     Comments: Flat      LABORATORY DATA:  I have reviewed the data as listed    Latest Ref Rng & Units 03/07/2024    8:57 AM 02/22/2024   10:37 AM 02/08/2024    9:00 AM  CBC  WBC 4.0 - 10.5 K/uL 5.1  5.4  5.3   Hemoglobin 13.0 - 17.0 g/dL 88.0  87.8  87.6   Hematocrit 39.0 -  52.0 % 36.1  37.6  38.0   Platelets 150 - 400 K/uL 177  194  202       Latest Ref Rng & Units 03/07/2024    8:57 AM 02/22/2024   10:37 AM 02/08/2024    9:00 AM  CMP  Glucose 70 - 99 mg/dL 895  891  892   BUN 8 - 23 mg/dL 8  9  6    Creatinine 0.61 - 1.24 mg/dL 9.27  9.29  9.23   Sodium 135 - 145 mmol/L 137  134  136   Potassium 3.5 - 5.1 mmol/L 3.8  3.5  3.7   Chloride 98 - 111 mmol/L 107  102  104   CO2 22 - 32 mmol/L 23  24  26    Calcium 8.9 - 10.3 mg/dL 9.0  9.2  9.1   Total Protein 6.5 - 8.1 g/dL 6.9  7.4  7.4   Total Bilirubin 0.0 - 1.2 mg/dL 0.6  0.6  0.6   Alkaline  Phos 38 - 126 U/L 82  86  85   AST 15 - 41 U/L 17  17  17    ALT 0 - 44 U/L 12  12  13       RADIOGRAPHIC STUDIES: I have personally reviewed the radiological images as listed and agreed with the findings in the report. CT Chest W Contrast Result Date: 02/27/2024 CLINICAL DATA:  Left lung cancer restaging * Tracking Code: BO * EXAM: CT CHEST WITH CONTRAST TECHNIQUE: Multidetector CT imaging of the chest was performed during intravenous contrast administration. RADIATION DOSE REDUCTION: This exam was performed according to the departmental dose-optimization program which includes automated exposure control, adjustment of the mA and/or kV according to patient size and/or use of iterative reconstruction technique. CONTRAST:  75mL OMNIPAQUE  IOHEXOL  300 MG/ML  SOLN COMPARISON:  CT chest abdomen pelvis, 10/26/2023 FINDINGS: Cardiovascular: Aortic atherosclerosis. Normal heart size. Three-vessel coronary artery calcifications. Enlargement of the main pulmonary artery measuring up to 3.5 cm in caliber. No pericardial effusion. Mediastinum/Nodes: Unchanged right paratracheal node measuring 1.1 x 1.1 cm (series 2, image 64). No other enlarged mediastinal, hilar, or axillary lymph nodes. Thyroid  gland, trachea, and esophagus demonstrate no significant findings. Lungs/Pleura: Unchanged spiculated nodule of the central left upper lobe measuring 1.9 x 1.0 cm (series 4, image 44). Interval enlargement in a lobulated nodule of the lateral segment right middle lobe encasing a small bronchial, measuring 1.2 x 0.8 cm, previously 0.8 x 0.6 cm (series 4, image 99). Unchanged bandlike ground-glass in the peripheral right upper lobe (series 4, image 55). Moderate centrilobular emphysema. Diffuse bilateral bronchial wall thickening. No pleural effusion or pneumothorax. Upper Abdomen: No acute abnormality. Small gallstone. Benign, macroscopic fat containing bilateral adrenal adenomata as well as a benign myelolipoma of the left  adrenal. Musculoskeletal: No chest wall abnormality. No acute osseous findings. IMPRESSION: 1. Unchanged spiculated nodule of the central left upper lobe measuring 1.9 x 1.0 cm, consistent with treated primary lung malignancy. 2. Interval enlargement in a lobulated nodule of the lateral segment right middle lobe encasing a small bronchial, measuring 1.2 x 0.8 cm, previously 0.8 x 0.6 cm. This is highly suspicious for synchronous malignancy or metastasis. Given size this is likely amenable to PET-CT characterization. 3. Unchanged bandlike ground-glass in the peripheral right upper lobe. 4. Unchanged right paratracheal lymph node measuring 1.1 x 1.1 cm. No other enlarged mediastinal, hilar, or axillary lymph nodes. 5. Emphysema and diffuse bilateral bronchial wall thickening. 6. Enlargement of the main pulmonary  artery, as can be seen in pulmonary hypertension. 7. Coronary artery disease. Aortic Atherosclerosis (ICD10-I70.0) and Emphysema (ICD10-J43.9). Electronically Signed   By: Marolyn JONETTA Jaksch M.D.   On: 02/27/2024 21:40

## 2024-03-07 NOTE — Patient Instructions (Signed)
 [facility] - A DEPT OF [facility]  Discharge Instructions: Thank you for choosing [facility] to provide your oncology and hematology care.  If you have a lab appointment with the Cancer Center, please go directly to the Cancer Center and check in at the registration area.  Wear comfortable clothing and clothing appropriate for easy access to any Portacath or PICC line.   We strive to give you quality time with your provider. You may need to reschedule your appointment if you arrive late (15 or more minutes).  Arriving late affects you and other patients whose appointments are after yours.  Also, if you miss three or more appointments without notifying the office, you may be dismissed from the clinic at the provider's discretion.      For prescription refill requests, have your pharmacy contact our office and allow 72 hours for refills to be completed.    Today you received the following chemotherapy and/or immunotherapy agents Imfinzi      To help prevent nausea and vomiting after your treatment, we encourage you to take your nausea medication as directed.  BELOW ARE SYMPTOMS THAT SHOULD BE REPORTED IMMEDIATELY: *FEVER GREATER THAN 100.4 F (38 C) OR HIGHER *CHILLS OR SWEATING *NAUSEA AND VOMITING THAT IS NOT CONTROLLED WITH YOUR NAUSEA MEDICATION *UNUSUAL SHORTNESS OF BREATH *UNUSUAL BRUISING OR BLEEDING *URINARY PROBLEMS (pain or burning when urinating, or frequent urination) *BOWEL PROBLEMS (unusual diarrhea, constipation, pain near the anus) TENDERNESS IN MOUTH AND THROAT WITH OR WITHOUT PRESENCE OF ULCERS (sore throat, sores in mouth, or a toothache) UNUSUAL RASH, SWELLING OR PAIN  UNUSUAL VAGINAL DISCHARGE OR ITCHING   Items with * indicate a potential emergency and should be followed up as soon as possible or go to the Emergency Department if any problems should occur.  Please show the CHEMOTHERAPY ALERT CARD or IMMUNOTHERAPY  ALERT CARD at check-in to the Emergency Department and triage nurse.  Should you have questions after your visit or need to cancel or reschedule your appointment, please contact [facility] - A DEPT OF [facility]  [phone] and follow the prompts.  Office hours are 8:00 a.m. to 4:30 p.m. Monday - Friday. Please note that voicemails left after 4:00 p.m. may not be returned until the following business day.  We are closed weekends and major holidays. You have access to a nurse at all times for urgent questions. Please call the main number to the clinic [phone] and follow the prompts.  For any non-urgent questions, you may also contact your provider using MyChart. We now offer e-Visits for anyone 1 and older to request care online for non-urgent symptoms. For details visit [email or web address].   Also download the MyChart app! Go to the app store, search "MyChart", open the app, select [place], and log in with your MyChart username and password.

## 2024-03-07 NOTE — Assessment & Plan Note (Signed)
 Continue albuterol inhaler PRN, Duonebs.  follow up with pulmonology Dr. Meredeth Ide.  Continue Singulair, mucinex.

## 2024-03-07 NOTE — Assessment & Plan Note (Addendum)
 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  Left upper lobe adenocarcinoma, No enough tissue for NGS, liquid biopsy showed actionable variants.   S/p concurrent chemotherapy radiation. -carboplatin  AUC 2 and taxol  45mg /m2  On Durvalumab  maintenance.   Clinically he is doing well today Labs are reviewed and discussed with patient. Proceed with Durvalumab  maintenance.   CT scan showed stable treated left upper lobe lesion, however interval increase of right middle lobe lesion. Obtain PET scan for further evaluation. If PET positive, refer to Radonc for SBRT.

## 2024-03-18 ENCOUNTER — Ambulatory Visit (INDEPENDENT_AMBULATORY_CARE_PROVIDER_SITE_OTHER): Admitting: Podiatry

## 2024-03-18 ENCOUNTER — Ambulatory Visit
Admission: RE | Admit: 2024-03-18 | Discharge: 2024-03-18 | Disposition: A | Discharge status: Home / Self Care | Source: Ambulatory Visit | Attending: Oncology | Admitting: Oncology

## 2024-03-18 DIAGNOSIS — D1779 Benign lipomatous neoplasm of other sites: Secondary | ICD-10-CM | POA: Insufficient documentation

## 2024-03-18 DIAGNOSIS — C3411 Malignant neoplasm of upper lobe, right bronchus or lung: Secondary | ICD-10-CM | POA: Diagnosis present

## 2024-03-18 DIAGNOSIS — R911 Solitary pulmonary nodule: Secondary | ICD-10-CM | POA: Diagnosis not present

## 2024-03-18 DIAGNOSIS — C3490 Malignant neoplasm of unspecified part of unspecified bronchus or lung: Secondary | ICD-10-CM | POA: Insufficient documentation

## 2024-03-18 DIAGNOSIS — D3501 Benign neoplasm of right adrenal gland: Secondary | ICD-10-CM | POA: Diagnosis not present

## 2024-03-18 DIAGNOSIS — I700 Atherosclerosis of aorta: Secondary | ICD-10-CM | POA: Insufficient documentation

## 2024-03-18 DIAGNOSIS — M79675 Pain in left toe(s): Secondary | ICD-10-CM

## 2024-03-18 DIAGNOSIS — B351 Tinea unguium: Secondary | ICD-10-CM

## 2024-03-18 DIAGNOSIS — D689 Coagulation defect, unspecified: Secondary | ICD-10-CM

## 2024-03-18 DIAGNOSIS — J439 Emphysema, unspecified: Secondary | ICD-10-CM | POA: Insufficient documentation

## 2024-03-18 DIAGNOSIS — I2510 Atherosclerotic heart disease of native coronary artery without angina pectoris: Secondary | ICD-10-CM | POA: Insufficient documentation

## 2024-03-18 DIAGNOSIS — M79674 Pain in right toe(s): Secondary | ICD-10-CM | POA: Diagnosis not present

## 2024-03-18 LAB — GLUCOSE, CAPILLARY: Glucose-Capillary: 101 mg/dL — ABNORMAL HIGH (ref 70–99)

## 2024-03-18 MED ORDER — FLUDEOXYGLUCOSE F - 18 (FDG) INJECTION
11.8800 | Freq: Once | INTRAVENOUS | Status: AC | PRN
  Administered 2024-03-18 (×2): 11.88 via INTRAVENOUS

## 2024-03-19 ENCOUNTER — Other Ambulatory Visit: Payer: Self-pay

## 2024-03-20 NOTE — Progress Notes (Signed)
  Subjective:  Patient ID: [name], male    DOB: 1961/01/25,  MRN: [id number]  [name] presents to clinic today for at risk foot care with h/o coagulation defect and painful thick toenails that are difficult to trim. Pain interferes with ambulation. Aggravating factors include wearing enclosed shoe gear. Pain is relieved with periodic professional debridement.  Chief Complaint  Patient presents with   RFC   New problem(s): None.   PCP is Aycock, Ngwe A, MD.  No Known Allergies  Review of Systems: Negative except as noted in the HPI.  Objective: No changes noted in today's physical examination. There were no vitals filed for this visit. [name] is a pleasant 63 y.o. male obese in NAD. AAO x 3.  Vascular Examination: Capillary refill time immediate b/l. Vascular status intact b/l with palpable pedal pulses. Pedal hair present b/l. No pain with calf compression b/l. Skin temperature gradient WNL b/l. No cyanosis or clubbing b/l. No ischemia or gangrene noted b/l.   Neurological Examination: Sensation grossly intact b/l with 10 gram monofilament. Vibratory sensation intact b/l.   Dermatological Examination: Pedal skin with normal turgor, texture and tone b/l.  No open wounds. No interdigital macerations.   Toenails 1-5 b/l thick, discolored, elongated with subungual debris and pain on dorsal palpation.   No corns, calluses, nor porokeratotic lesions.  Musculoskeletal Examination: Normal muscle strength 5/5 to all lower extremity muscle groups bilaterally. No pain, crepitus or joint limitation noted with ROM b/l LE. No gross bony pedal deformities b/l. Patient ambulates independently without assistive aids.  Radiographs: None  Last A1c:      Latest Ref Rng & Units 10/15/2023    3:22 PM 06/17/2023    1:32 PM 03/26/2023    7:09 AM  Hemoglobin A1C  Hemoglobin-A1c 4.8 - 5.6 % 5.6  5.6  5.8    Assessment/Plan: 1. Pain due to onychomycosis of toenails of both feet   2.  Coagulation disorder [facility])   Patient was evaluated and treated. All patient's and/or POA's questions/concerns addressed on today's visit. Mycotic toenails 1-5 debrided in length and girth without incident.  Continue daily foot inspections and monitor blood glucose per PCP/Endocrinologist's recommendations.Continue soft, supportive shoe gear daily. Report any pedal injuries to medical professional. Call office if there are any quesitons/concerns. Caregiver/provider present with patient on today's visit.  Return in about 3 months (around 06/18/2024).  Delon LITTIE Merlin, DPM      [place] LOCATION: [address] [place] [address]                   Office [phone]   [facility] LOCATION: [address] [place], [place] [address] Office [phone]

## 2024-03-21 ENCOUNTER — Encounter: Payer: Self-pay | Admitting: Podiatry

## 2024-03-21 ENCOUNTER — Other Ambulatory Visit

## 2024-03-21 ENCOUNTER — Ambulatory Visit

## 2024-03-21 ENCOUNTER — Ambulatory Visit: Admitting: Oncology

## 2024-03-28 ENCOUNTER — Ambulatory Visit: Payer: Self-pay | Admitting: Oncology

## 2024-03-28 NOTE — Progress Notes (Signed)
Called pt again and no answer.

## 2024-03-28 NOTE — Telephone Encounter (Signed)
-----   Message from Zelphia Cap sent at 03/28/2024  8:53 AM EDT ----- Please let patient know that there is a spot in his right lung that has increased activity, suspect cancerous process. Please arrange him to see Dr. Lenn ASAP dorrie he has appt in Oct] for  SBRT Thank you I will see him 2-3 weeks after SBRT to resume Durvalumab .  ----- Message ----- From: Interface, Rad Results In Sent: 03/18/2024   1:28 PM EDT To: Zelphia Cap, MD

## 2024-03-28 NOTE — Telephone Encounter (Signed)
 Called pt x2 this morning. No answer. Unable to leave VM.

## 2024-04-01 NOTE — Progress Notes (Signed)
 Attempt made to contact pt today at 2 separate times, no answer.

## 2024-04-04 ENCOUNTER — Ambulatory Visit: Admitting: Oncology

## 2024-04-04 ENCOUNTER — Other Ambulatory Visit

## 2024-04-04 ENCOUNTER — Ambulatory Visit

## 2024-04-09 ENCOUNTER — Encounter: Payer: Self-pay | Admitting: Radiation Oncology

## 2024-04-09 ENCOUNTER — Ambulatory Visit
Admission: RE | Admit: 2024-04-09 | Discharge: 2024-04-09 | Disposition: A | Discharge status: Home / Self Care | Source: Ambulatory Visit | Attending: Radiation Oncology | Admitting: Radiation Oncology

## 2024-04-09 VITALS — BP 152/89 | HR 98 | Temp 98.0°F | Resp 18 | Ht 73.0 in | Wt 212.4 lb

## 2024-04-09 DIAGNOSIS — Z510 Encounter for antineoplastic radiation therapy: Secondary | ICD-10-CM | POA: Insufficient documentation

## 2024-04-09 DIAGNOSIS — C3412 Malignant neoplasm of upper lobe, left bronchus or lung: Secondary | ICD-10-CM | POA: Diagnosis not present

## 2024-04-09 DIAGNOSIS — R918 Other nonspecific abnormal finding of lung field: Secondary | ICD-10-CM | POA: Diagnosis present

## 2024-04-09 NOTE — Addendum Note (Signed)
 Encounter addended by: Roni Roseline DASEN, RN on: 04/09/2024 11:49 AM  Actions taken: Patient Contact edited

## 2024-04-09 NOTE — Progress Notes (Signed)
 Radiation Oncology Follow up Note old patient new area new right middle lobe lesion  Name: [name]   Date:   04/09/2024 MRN:  [id number] DOB: 10/06/1960    This 63 y.o. 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  REFERRING PROVIDER: Lorel Maxie LABOR, MD  HPI: Patient is a 63 year old male now out close to 10 months having completed concurrent chemoradiation therapy for stage IIIb adenocarcinoma the left lung..  Patient has had been on maintenance Durvalumab  which she was tolerating well.  Recent CT scan shows stable lesion of the left upper lobe however there was increased right middle lobe lesion which has been progressing over time now measuring 1.2 cm previously was 0.8 cm.  This is highly suspicious for synchronous malignancy.  He underwent a PET CT scan which showed hypermetabolic activity in 1 cm lateral segment right middle lobe nodule consistent with bronchogenic carcinoma.  Patient is asymptomatic specifically Nuys cough hemoptysis chest tightness or dysphagia.  He is seen today for consideration of SBRT  COMPLICATIONS OF TREATMENT: none  FOLLOW UP COMPLIANCE: keeps appointments   PHYSICAL EXAM:  BP (!) 152/89   Pulse 98   Temp 98 F (36.7 C)   Resp 18   Ht 6' 1 (1.854 m)   Wt 212 lb 6.4 oz (96.3 kg)   BMI 28.02 kg/m  Well-developed well-nourished patient in NAD. HEENT reveals PERLA, EOMI, discs not visualized.  Oral cavity is clear. No oral mucosal lesions are identified. Neck is clear without evidence of cervical or supraclavicular adenopathy. Lungs are clear to A&P. Cardiac examination is essentially unremarkable with regular rate and rhythm without murmur rub or thrill. Abdomen is benign with no organomegaly or masses noted. Motor sensory and DTR levels are equal and symmetric in  the upper and lower extremities. Cranial nerves II through XII are grossly intact. Proprioception is intact. No peripheral adenopathy or edema is identified. No motor or sensory levels are noted. Crude visual fields are within normal range.  RADIOLOGY RESULTS: CT scans and PET CT scans reviewed compatible with above-stated findings  PLAN: At this time elected ahead with SBRT to his right middle lobe lesion.  Will treat in 3 fractions.  Would use for D treatment planning.  Will be able to spare previously radiated left lung and mediastinum with our SBRT treatment fields.  Risks and benefits of treatment including extremely low side effect profile from SBRT.  I have personally set up and ordered CT simulation for next week.  Patient comprehends my recommendations well.  I would like to take this opportunity to thank you for allowing me to participate in the care of your patient.SABRA Marcey Penton, MD

## 2024-04-17 ENCOUNTER — Ambulatory Visit
Admission: RE | Admit: 2024-04-17 | Discharge: 2024-04-17 | Disposition: A | Discharge status: Home / Self Care | Source: Ambulatory Visit | Attending: Radiation Oncology | Admitting: Radiation Oncology

## 2024-04-17 DIAGNOSIS — R918 Other nonspecific abnormal finding of lung field: Secondary | ICD-10-CM | POA: Diagnosis not present

## 2024-04-22 DIAGNOSIS — R918 Other nonspecific abnormal finding of lung field: Secondary | ICD-10-CM | POA: Diagnosis not present

## 2024-04-29 ENCOUNTER — Ambulatory Visit

## 2024-05-01 ENCOUNTER — Ambulatory Visit
Admission: RE | Admit: 2024-05-01 | Discharge: 2024-05-01 | Disposition: A | Discharge status: Home / Self Care | Source: Ambulatory Visit | Attending: Radiation Oncology | Admitting: Radiation Oncology

## 2024-05-01 ENCOUNTER — Other Ambulatory Visit: Payer: Self-pay

## 2024-05-01 DIAGNOSIS — R918 Other nonspecific abnormal finding of lung field: Secondary | ICD-10-CM | POA: Diagnosis not present

## 2024-05-01 LAB — RAD ONC ARIA SESSION SUMMARY
Course Elapsed Days: 0
Plan Fractions Treated to Date: 1
Plan Prescribed Dose Per Fraction: 18 Gy
Plan Total Fractions Prescribed: 3
Plan Total Prescribed Dose: 54 Gy
Reference Point Dosage Given to Date: 18 Gy
Reference Point Session Dosage Given: 18 Gy
Session Number: 1

## 2024-05-06 ENCOUNTER — Ambulatory Visit
Admission: RE | Admit: 2024-05-06 | Discharge: 2024-05-06 | Disposition: A | Discharge status: Home / Self Care | Source: Ambulatory Visit | Attending: Radiation Oncology | Admitting: Radiation Oncology

## 2024-05-06 ENCOUNTER — Other Ambulatory Visit: Payer: Self-pay

## 2024-05-06 DIAGNOSIS — R918 Other nonspecific abnormal finding of lung field: Secondary | ICD-10-CM | POA: Diagnosis not present

## 2024-05-06 LAB — RAD ONC ARIA SESSION SUMMARY
Course Elapsed Days: 5
Plan Fractions Treated to Date: 2
Plan Prescribed Dose Per Fraction: 18 Gy
Plan Total Fractions Prescribed: 3
Plan Total Prescribed Dose: 54 Gy
Reference Point Dosage Given to Date: 36 Gy
Reference Point Session Dosage Given: 18 Gy
Session Number: 2

## 2024-05-08 ENCOUNTER — Other Ambulatory Visit: Payer: Self-pay

## 2024-05-08 ENCOUNTER — Ambulatory Visit
Admission: RE | Admit: 2024-05-08 | Discharge: 2024-05-08 | Disposition: A | Discharge status: Home / Self Care | Source: Ambulatory Visit | Attending: Radiation Oncology | Admitting: Radiation Oncology

## 2024-05-08 DIAGNOSIS — R918 Other nonspecific abnormal finding of lung field: Secondary | ICD-10-CM | POA: Insufficient documentation

## 2024-05-08 DIAGNOSIS — C3412 Malignant neoplasm of upper lobe, left bronchus or lung: Secondary | ICD-10-CM | POA: Diagnosis present

## 2024-05-08 DIAGNOSIS — Z510 Encounter for antineoplastic radiation therapy: Secondary | ICD-10-CM | POA: Diagnosis not present

## 2024-05-08 LAB — RAD ONC ARIA SESSION SUMMARY
Course Elapsed Days: 7
Plan Fractions Treated to Date: 3
Plan Prescribed Dose Per Fraction: 18 Gy
Plan Total Fractions Prescribed: 3
Plan Total Prescribed Dose: 54 Gy
Reference Point Dosage Given to Date: 54 Gy
Reference Point Session Dosage Given: 18 Gy
Session Number: 3

## 2024-05-09 NOTE — Radiation Completion Notes (Signed)
 Patient Name: [name], [name] MRN: [id number] Date of Birth: 12/06/1960 Referring Physician: NGWE AYCOCK, M.D. Date of Service: 2024-05-09 Radiation Oncologist: Marcey Penton, M.D. [facility]                             RADIATION ONCOLOGY END OF TREATMENT NOTE     Diagnosis: C34.11 Malignant neoplasm of upper lobe, right bronchus or lung Staging on 2023-03-22: Adenocarcinoma of lung (HCC) T=cT1, N=cN3, M=cM0 Intent: Curative     HPI: Patient is a 63 year old male now out close to 10 months having completed concurrent chemoradiation therapy for stage IIIb adenocarcinoma the left lung..  Patient has had been on maintenance Durvalumab  which she was tolerating well.  Recent CT scan shows stable lesion of the left upper lobe however there was increased right middle lobe lesion which has been progressing over time now measuring 1.2 cm previously was 0.8 cm.  This is highly suspicious for synchronous malignancy.  He underwent a PET CT scan which showed hypermetabolic activity in 1 cm lateral segment right middle lobe nodule consistent with bronchogenic carcinoma.  Patient is asymptomatic specifically Nuys cough hemoptysis chest tightness or dysphagia.  He is seen today for consideration of SBRT      ==========DELIVERED PLANS==========  First Treatment Date: 2024-05-01 Last Treatment Date: 2024-05-08   Plan Name: Lung_R_SBRT Site: Lung, Right Technique: SBRT/SRT-IMRT Mode: Photon Dose Per Fraction: 18 Gy Prescribed Dose (Delivered / Prescribed): 54 Gy / 54 Gy Prescribed Fxs (Delivered / Prescribed): 3 / 3     ==========ON TREATMENT VISIT DATES========== 2024-05-01, 2024-05-06, 2024-05-08, 2024-05-08     ==========UPCOMING VISITS========== 06/20/2024 [facility] Gaynel Delon CROME, DPM  06/13/2024 [facility] RAD ONCOLOGY FOLLOW UP 30 Chrystal, Marcey, MD        ==========APPENDIX - ON TREATMENT VISIT NOTES==========   See  weekly On Treatment Notes in Epic for details in the Media tab (listed as Progress notes on the On Treatment Visit Dates listed above).

## 2024-06-06 ENCOUNTER — Ambulatory Visit: Admitting: Radiation Oncology

## 2024-06-13 ENCOUNTER — Encounter: Payer: Self-pay | Admitting: Radiation Oncology

## 2024-06-13 ENCOUNTER — Other Ambulatory Visit: Payer: Self-pay | Admitting: *Deleted

## 2024-06-13 ENCOUNTER — Ambulatory Visit
Admission: RE | Admit: 2024-06-13 | Discharge: 2024-06-13 | Disposition: A | Discharge status: Home / Self Care | Source: Ambulatory Visit | Attending: Radiation Oncology | Admitting: Radiation Oncology

## 2024-06-13 VITALS — BP 158/91 | HR 80 | Temp 97.0°F | Resp 18 | Ht 73.0 in | Wt 214.2 lb

## 2024-06-13 DIAGNOSIS — Z923 Personal history of irradiation: Secondary | ICD-10-CM | POA: Insufficient documentation

## 2024-06-13 DIAGNOSIS — C3411 Malignant neoplasm of upper lobe, right bronchus or lung: Secondary | ICD-10-CM

## 2024-06-13 DIAGNOSIS — C3412 Malignant neoplasm of upper lobe, left bronchus or lung: Secondary | ICD-10-CM | POA: Insufficient documentation

## 2024-06-13 DIAGNOSIS — C342 Malignant neoplasm of middle lobe, bronchus or lung: Secondary | ICD-10-CM | POA: Insufficient documentation

## 2024-06-13 NOTE — Progress Notes (Signed)
 Radiation Oncology Follow up Note  Name: [name]   Date:   06/13/2024 MRN:  [id number] DOB: 1961-05-23    This 63 y.o. male presents to the clinic today for 1 month follow-up status post SBRT for a new lesion of the right middle lobe PET positive consistent with lung cancer and patient previously treated for stage III (cT1 cN3 M0) adenocarcinoma the left lung.  REFERRING PROVIDER: Lorel Maxie LABOR, MD  HPI: Patient is a 63 year old male now out 1 month having completed SBRT to a right middle lobe lesion PET positive consistent with new primary lung cancer and patient previously treated for stage III adenocarcinoma left lung.  Seen today in routine follow-up he is doing well specifically denies cough hemoptysis chest tightness any change in his pulmonary status or dysphagia..  COMPLICATIONS OF TREATMENT: none  FOLLOW UP COMPLIANCE: keeps appointments   PHYSICAL EXAM:  BP (!) 158/91   Pulse 80   Temp (!) 97 F (36.1 C)   Resp 18   Ht 6' 1 (1.854 m)   Wt 214 lb 3.2 oz (97.2 kg)   BMI 28.26 kg/m  Well-developed well-nourished patient in NAD. HEENT reveals PERLA, EOMI, discs not visualized.  Oral cavity is clear. No oral mucosal lesions are identified. Neck is clear without evidence of cervical or supraclavicular adenopathy. Lungs are clear to A&P. Cardiac examination is essentially unremarkable with regular rate and rhythm without murmur rub or thrill. Abdomen is benign with no organomegaly or masses noted. Motor sensory and DTR levels are equal and symmetric in the upper and lower extremities. Cranial nerves II through XII are grossly intact. Proprioception is intact. No peripheral adenopathy or edema is identified. No motor or sensory levels are noted. Crude visual fields are within normal range.  RADIOLOGY RESULTS: CT scan of the chest ordered  PLAN: Present time patient is clinically doing well with very little residual side effects from his SBRT treatment.  I have asked to see him  back in 3 months with a repeat CT scan of his chest prior to that visit.  We are also making sure he is continuing follow-up care with medical oncology.  Patient knows to call with any concerns.  I would like to take this opportunity to thank you for allowing me to participate in the care of your patient.SABRA Marcey Penton, MD

## 2024-06-14 ENCOUNTER — Other Ambulatory Visit: Payer: Self-pay

## 2024-06-14 ENCOUNTER — Encounter: Payer: Self-pay | Admitting: Radiation Oncology

## 2024-06-20 ENCOUNTER — Ambulatory Visit (INDEPENDENT_AMBULATORY_CARE_PROVIDER_SITE_OTHER): Admitting: Podiatry

## 2024-06-20 DIAGNOSIS — M79675 Pain in left toe(s): Secondary | ICD-10-CM | POA: Diagnosis not present

## 2024-06-20 DIAGNOSIS — D689 Coagulation defect, unspecified: Secondary | ICD-10-CM | POA: Diagnosis not present

## 2024-06-20 DIAGNOSIS — B351 Tinea unguium: Secondary | ICD-10-CM

## 2024-06-20 DIAGNOSIS — M79674 Pain in right toe(s): Secondary | ICD-10-CM

## 2024-06-21 ENCOUNTER — Other Ambulatory Visit: Payer: Self-pay | Admitting: Oncology

## 2024-06-21 ENCOUNTER — Other Ambulatory Visit: Payer: Self-pay

## 2024-06-25 ENCOUNTER — Other Ambulatory Visit: Payer: Self-pay | Admitting: Oncology

## 2024-06-25 ENCOUNTER — Encounter: Payer: Self-pay | Admitting: Oncology

## 2024-06-25 ENCOUNTER — Telehealth: Payer: Self-pay | Admitting: Oncology

## 2024-06-25 DIAGNOSIS — C3492 Malignant neoplasm of unspecified part of left bronchus or lung: Secondary | ICD-10-CM

## 2024-06-25 NOTE — Telephone Encounter (Signed)
 Per MD, pt will be starting tx again. Pharmacy said okay to add this week.  I spoke with pt and with group home manager and confirmed appts for this Thursday 11/20 at [facility]

## 2024-06-26 ENCOUNTER — Emergency Department
Admission: EM | Admit: 2024-06-26 | Discharge: 2024-06-27 | Disposition: A | Discharge status: Home / Self Care | Source: Other Acute Inpatient Hospital | Attending: Emergency Medicine | Admitting: Emergency Medicine

## 2024-06-26 ENCOUNTER — Other Ambulatory Visit: Payer: Self-pay

## 2024-06-26 ENCOUNTER — Encounter: Payer: Self-pay | Admitting: Oncology

## 2024-06-26 ENCOUNTER — Telehealth: Payer: Self-pay

## 2024-06-26 DIAGNOSIS — Z8619 Personal history of other infectious and parasitic diseases: Secondary | ICD-10-CM | POA: Insufficient documentation

## 2024-06-26 DIAGNOSIS — L03116 Cellulitis of left lower limb: Secondary | ICD-10-CM | POA: Insufficient documentation

## 2024-06-26 DIAGNOSIS — I10 Essential (primary) hypertension: Secondary | ICD-10-CM | POA: Diagnosis not present

## 2024-06-26 DIAGNOSIS — M79672 Pain in left foot: Secondary | ICD-10-CM | POA: Diagnosis present

## 2024-06-26 DIAGNOSIS — Z9289 Personal history of other medical treatment: Secondary | ICD-10-CM

## 2024-06-26 DIAGNOSIS — J449 Chronic obstructive pulmonary disease, unspecified: Secondary | ICD-10-CM | POA: Diagnosis not present

## 2024-06-26 DIAGNOSIS — Z8673 Personal history of transient ischemic attack (TIA), and cerebral infarction without residual deficits: Secondary | ICD-10-CM | POA: Insufficient documentation

## 2024-06-26 LAB — COMPREHENSIVE METABOLIC PANEL WITH GFR
ALT: 9 U/L (ref 0–44)
AST: 15 U/L (ref 15–41)
Albumin: 3.8 g/dL (ref 3.5–5.0)
Alkaline Phosphatase: 81 U/L (ref 38–126)
Anion gap: 10 (ref 5–15)
BUN: 5 mg/dL — ABNORMAL LOW (ref 8–23)
CO2: 27 mmol/L (ref 22–32)
Calcium: 9 mg/dL (ref 8.9–10.3)
Chloride: 100 mmol/L (ref 98–111)
Creatinine, Ser: 0.62 mg/dL (ref 0.61–1.24)
GFR, Estimated: >60 mL/min (ref >60)
Glucose, Bld: 100 mg/dL — ABNORMAL HIGH (ref 70–99)
Potassium: 3.9 mmol/L (ref 3.5–5.1)
Sodium: 136 mmol/L (ref 135–145)
Total Bilirubin: 0.5 mg/dL (ref 0.0–1.2)
Total Protein: 6.6 g/dL (ref 6.5–8.1)

## 2024-06-26 LAB — CBC
HCT: 33.8 % — ABNORMAL LOW (ref 39.0–52.0)
Hemoglobin: 11.1 g/dL — ABNORMAL LOW (ref 13.0–17.0)
MCH: 29.3 pg (ref 26.0–34.0)
MCHC: 32.8 g/dL (ref 30.0–36.0)
MCV: 89.2 fL (ref 80.0–100.0)
Platelets: 188 K/uL (ref 150–400)
RBC: 3.79 MIL/uL — ABNORMAL LOW (ref 4.22–5.81)
RDW: 14.2 % (ref 11.5–15.5)
WBC: 8.1 K/uL (ref 4.0–10.5)
nRBC: 0 % (ref 0.0–0.2)

## 2024-06-26 MED ORDER — HYDROCODONE-ACETAMINOPHEN 5-325 MG PO TABS
1.0000 | ORAL_TABLET | Freq: Once | ORAL | Status: AC
  Administered 2024-06-26: 1 via ORAL
  Filled 2024-06-26: qty 1

## 2024-06-26 MED ORDER — DOXYCYCLINE HYCLATE 100 MG PO TABS
100.0000 mg | ORAL_TABLET | Freq: Once | ORAL | Status: AC
  Administered 2024-06-26: 100 mg via ORAL
  Filled 2024-06-26: qty 1

## 2024-06-26 MED ORDER — HYDROCODONE-ACETAMINOPHEN 5-325 MG PO TABS: 1.0000 | ORAL_TABLET | Freq: Three times a day (TID) | ORAL | 0 refills | Status: AC | PRN

## 2024-06-26 MED ORDER — CEPHALEXIN 500 MG PO CAPS
500.0000 mg | ORAL_CAPSULE | Freq: Once | ORAL | Status: AC
  Administered 2024-06-26: 500 mg via ORAL
  Filled 2024-06-26: qty 1

## 2024-06-26 MED ORDER — CEPHALEXIN 500 MG PO CAPS: 500.0000 mg | ORAL_CAPSULE | Freq: Four times a day (QID) | ORAL | 0 refills | Status: AC

## 2024-06-26 MED ORDER — DOXYCYCLINE HYCLATE 100 MG PO TABS: 100.0000 mg | ORAL_TABLET | Freq: Two times a day (BID) | ORAL | 0 refills | Status: AC

## 2024-06-26 NOTE — ED Triage Notes (Signed)
 Pt comes via EMS from [facility]. EMS was called out for allergic reaction. Pt has scabby rash on hands. Pt denies any itching or burning. Pt states 10/10 pain in foot.  Pt has dried skin and area on feet as well.   HR 80s BP 177/88

## 2024-06-26 NOTE — ED Provider Notes (Signed)
 ----------------------------------------- 3:22 PM on 06/26/2024 -----------------------------------------  Blood pressure (!) 160/93, pulse 80, temperature 98.1 F (36.7 C), temperature source Oral, resp. rate 20, height 6' 1 (1.854 m), weight 97.2 kg, SpO2 93%.  Assuming care from  [facility], PA-C/NP-C.  In short, [name] is a 63 y.o. male with a chief complaint of Foot Pain .  Refer to the original H&P for additional details.  The current plan of care is to await pending labs and with a high clinical suspicion for dermatologic signs of syphilis. We will likely treat empirically for RPR and ankle cellulitis.  ____________________________________________    ED Results / Procedures / Treatments   Labs (all labs ordered are listed, but only abnormal results are displayed) Labs Reviewed  CBC - Abnormal; Notable for the following components:      Result Value   RBC 3.79 (*)    Hemoglobin 11.1 (*)    HCT 33.8 (*)    All other components within normal limits  COMPREHENSIVE METABOLIC PANEL WITH GFR - Abnormal; Notable for the following components:   Glucose, Bld 100 (*)    BUN 5 (*)    All other components within normal limits  RPR     EKG    RADIOLOGY  No results found.   PROCEDURES:  Critical Care performed: No  Procedures   MEDICATIONS ORDERED IN ED: Medications  doxycycline  (VIBRA -TABS) tablet 100 mg (100 mg Oral Given 06/26/24 1610)  cephALEXin  (KEFLEX ) capsule 500 mg (500 mg Oral Given 06/26/24 1611)  HYDROcodone -acetaminophen  (NORCO/VICODIN) 5-325 MG per tablet 1 tablet (1 tablet Oral Given 06/26/24 2057)     IMPRESSION / MDM / ASSESSMENT AND PLAN / ED COURSE  I reviewed the triage vital signs and the nursing notes.                              Differential diagnosis includes, but is not limited to, cellulitis, dermatitis, tinea pedis, cornified skin changes, syphilis  Patient's presentation is most consistent with acute complicated illness /  injury requiring diagnostic workup.  Patient's diagnosis is consistent with cellulitis of lower extremity, dermatitis, with concerns with history of RPR.  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  Remaining labs overall reassuring at this time.  Patient discharged with prescriptions for doxycycline  for management of secondary syphilis and Keflex  for his local cellulitis.  He is given instruction on skin and footcare management.  He will follow-up with his PCP or podiatry as suggested.  Patient is given ED precautions to return to the ED for any worsening or new symptoms.     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    Cresencio Reesor,  Candida LULLA Kings, PA-C 06/26/24 2158    Suzanne Kirsch, MD 06/28/24 [id number]

## 2024-06-26 NOTE — ED Notes (Signed)
 Called lab to f/u on patients blood draw. Per lab they will make him next in line.

## 2024-06-26 NOTE — ED Notes (Signed)
 Pt states someone tried to get blood from him three times. Called lab to send someone to collect blood samples. Lab state they are a little behind but will put him on the list.

## 2024-06-26 NOTE — ED Notes (Signed)
 Lab called to verify patient on the list to have blood drawn. Per lab patient is still on the list.

## 2024-06-26 NOTE — ED Notes (Signed)
 Called all of patients contacts in the chart mutiple times with no answer. Called patient day service at [facility] and they were unable to assist. Called [facility] multiple times with no answer. Charge nurse of efforts to contact individuals to come get patient.

## 2024-06-26 NOTE — Discharge Instructions (Addendum)
 Take the prescription antibiotics as directed until all pills are gone.  Follow-up with your primary provider for ongoing evaluation.  Have some labs that are pending, which will be available within the week.  We will contact you with those results.  Keep your feet, clean, dry, or and moisturized with petroleum jelly.  Follow-up with your podiatrist for ongoing footcare.

## 2024-06-26 NOTE — ED Provider Notes (Signed)
   [facility] Provider Note    Event Date/Time   First MD Initiated Contact with Patient 06/26/24 1350     (approximate)   History   Foot Pain   HPI  [name] is a 63 y.o. male with history of COPD, PE, hypertension, schizophrenia, CVA and as listed in EMR presents to the emergency department for treatment and evaluation of rash on bilateral hands and feet. Rash started on feet. Started on palms 3 days ago. Occasionally itches. No new exposure. Feet are painful and feel tingly on the soles.      Physical Exam    Vitals:   06/26/24 1105 06/26/24 1436  BP: (!) 152/105 (!) 160/93  Pulse: 78 80  Resp: 16 20  Temp: 98.4 F (36.9 C) 98.1 F (36.7 C)  SpO2: 96% 93%    General: Awake, no distress.  CV:  Good peripheral perfusion.  Resp:  Normal effort.  Abd:  No distention.  Other:  Diffuse, macular, erythematous, rash on palms. No rash on soles of feet. Dorsal aspect of feet with similar rash but also scabbed areas on the left with mild ankle swelling. Patient able to demonstrate ROM of ankle.    ED Results / Procedures / Treatments   Labs (all labs ordered are listed, but only abnormal results are displayed)  Labs Reviewed  CBC  RPR  COMPREHENSIVE METABOLIC PANEL WITH GFR     EKG  Not indicated.   RADIOLOGY  Image and radiology report reviewed and interpreted by me. Radiology report consistent with the same.  Not indicated.  PROCEDURES:  Critical Care performed: No  Procedures   MEDICATIONS ORDERED IN ED:  Medications  doxycycline  (VIBRA -TABS) tablet 100 mg (has no administration in time range)  cephALEXin (KEFLEX) capsule 500 mg (has no administration in time range)     IMPRESSION / MDM / ASSESSMENT AND PLAN / ED COURSE   I have reviewed the triage note and vital signs. Vital signs are stable.   Differential diagnosis includes, but is not limited to, scabies, secondary syphilis.  Eczema, dermatitis, cellulitis,  neuropathy  Patient's presentation is most consistent with acute illness / injury with system symptoms.  63 year old male presenting to the emergency department for treatment and evaluation of rash on palms and bilateral extremities with burning pain to bilateral soles of feet.  He has not been diabetic.  He denies previous diagnosis of syphilis and does recall being tested but was negative.  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  Left lower extremity concerning for cellulitis .  Lab studies ordered.  ----------------------------------------- 3:29 PM on 06/26/2024 ----------------------------------------- Care transition to [facility], PA-C who will follow up on lab results and treat as indicated with likely plan for discharge.      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   Herlinda Kirk NOVAK, FNP 06/26/24 1531    Dicky Anes, MD 06/26/24 1626

## 2024-06-27 ENCOUNTER — Encounter: Payer: Self-pay | Admitting: Oncology

## 2024-06-27 ENCOUNTER — Inpatient Hospital Stay: Attending: Oncology

## 2024-06-27 ENCOUNTER — Inpatient Hospital Stay

## 2024-06-27 ENCOUNTER — Inpatient Hospital Stay (HOSPITAL_BASED_OUTPATIENT_CLINIC_OR_DEPARTMENT_OTHER): Admitting: Oncology

## 2024-06-27 VITALS — BP 145/98 | HR 93 | Temp 98.6°F | Resp 16 | Wt 210.0 lb

## 2024-06-27 DIAGNOSIS — Z7901 Long term (current) use of anticoagulants: Secondary | ICD-10-CM | POA: Insufficient documentation

## 2024-06-27 DIAGNOSIS — L03119 Cellulitis of unspecified part of limb: Secondary | ICD-10-CM | POA: Insufficient documentation

## 2024-06-27 DIAGNOSIS — K7689 Other specified diseases of liver: Secondary | ICD-10-CM | POA: Insufficient documentation

## 2024-06-27 DIAGNOSIS — I700 Atherosclerosis of aorta: Secondary | ICD-10-CM | POA: Insufficient documentation

## 2024-06-27 DIAGNOSIS — Z8673 Personal history of transient ischemic attack (TIA), and cerebral infarction without residual deficits: Secondary | ICD-10-CM | POA: Insufficient documentation

## 2024-06-27 DIAGNOSIS — C3412 Malignant neoplasm of upper lobe, left bronchus or lung: Secondary | ICD-10-CM | POA: Diagnosis present

## 2024-06-27 DIAGNOSIS — Z79899 Other long term (current) drug therapy: Secondary | ICD-10-CM | POA: Diagnosis not present

## 2024-06-27 DIAGNOSIS — D3502 Benign neoplasm of left adrenal gland: Secondary | ICD-10-CM | POA: Diagnosis not present

## 2024-06-27 DIAGNOSIS — J449 Chronic obstructive pulmonary disease, unspecified: Secondary | ICD-10-CM

## 2024-06-27 DIAGNOSIS — I2510 Atherosclerotic heart disease of native coronary artery without angina pectoris: Secondary | ICD-10-CM | POA: Insufficient documentation

## 2024-06-27 DIAGNOSIS — I272 Pulmonary hypertension, unspecified: Secondary | ICD-10-CM | POA: Insufficient documentation

## 2024-06-27 DIAGNOSIS — Z7902 Long term (current) use of antithrombotics/antiplatelets: Secondary | ICD-10-CM | POA: Diagnosis not present

## 2024-06-27 DIAGNOSIS — J439 Emphysema, unspecified: Secondary | ICD-10-CM | POA: Insufficient documentation

## 2024-06-27 DIAGNOSIS — C3492 Malignant neoplasm of unspecified part of left bronchus or lung: Secondary | ICD-10-CM

## 2024-06-27 DIAGNOSIS — F209 Schizophrenia, unspecified: Secondary | ICD-10-CM | POA: Insufficient documentation

## 2024-06-27 DIAGNOSIS — K8020 Calculus of gallbladder without cholecystitis without obstruction: Secondary | ICD-10-CM | POA: Insufficient documentation

## 2024-06-27 DIAGNOSIS — D3501 Benign neoplasm of right adrenal gland: Secondary | ICD-10-CM | POA: Insufficient documentation

## 2024-06-27 DIAGNOSIS — K429 Umbilical hernia without obstruction or gangrene: Secondary | ICD-10-CM | POA: Diagnosis not present

## 2024-06-27 DIAGNOSIS — I10 Essential (primary) hypertension: Secondary | ICD-10-CM | POA: Insufficient documentation

## 2024-06-27 DIAGNOSIS — I2699 Other pulmonary embolism without acute cor pulmonale: Secondary | ICD-10-CM

## 2024-06-27 DIAGNOSIS — J4489 Other specified chronic obstructive pulmonary disease: Secondary | ICD-10-CM | POA: Diagnosis not present

## 2024-06-27 DIAGNOSIS — D1779 Benign lipomatous neoplasm of other sites: Secondary | ICD-10-CM | POA: Diagnosis not present

## 2024-06-27 DIAGNOSIS — F17210 Nicotine dependence, cigarettes, uncomplicated: Secondary | ICD-10-CM | POA: Diagnosis not present

## 2024-06-27 LAB — CBC WITH DIFFERENTIAL (CANCER CENTER ONLY)
Abs Immature Granulocytes: 0.02 K/uL (ref 0.00–0.07)
Basophils Absolute: 0 K/uL (ref 0.0–0.1)
Basophils Relative: 0 %
Eosinophils Absolute: 0 K/uL (ref 0.0–0.5)
Eosinophils Relative: 1 %
HCT: 35.5 % — ABNORMAL LOW (ref 39.0–52.0)
Hemoglobin: 11.9 g/dL — ABNORMAL LOW (ref 13.0–17.0)
Immature Granulocytes: 0 %
Lymphocytes Relative: 19 %
Lymphs Abs: 1.2 K/uL (ref 0.7–4.0)
MCH: 29.1 pg (ref 26.0–34.0)
MCHC: 33.5 g/dL (ref 30.0–36.0)
MCV: 86.8 fL (ref 80.0–100.0)
Monocytes Absolute: 0.5 K/uL (ref 0.1–1.0)
Monocytes Relative: 8 %
Neutro Abs: 4.5 K/uL (ref 1.7–7.7)
Neutrophils Relative %: 72 %
Platelet Count: 194 K/uL (ref 150–400)
RBC: 4.09 MIL/uL — ABNORMAL LOW (ref 4.22–5.81)
RDW: 14 % (ref 11.5–15.5)
WBC Count: 6.2 K/uL (ref 4.0–10.5)
nRBC: 0 % (ref 0.0–0.2)

## 2024-06-27 LAB — CMP (CANCER CENTER ONLY)
ALT: 11 U/L (ref 0–44)
AST: 15 U/L (ref 15–41)
Albumin: 3.7 g/dL (ref 3.5–5.0)
Alkaline Phosphatase: 74 U/L (ref 38–126)
Anion gap: 8 (ref 5–15)
BUN: 6 mg/dL — ABNORMAL LOW (ref 8–23)
CO2: 23 mmol/L (ref 22–32)
Calcium: 9 mg/dL (ref 8.9–10.3)
Chloride: 103 mmol/L (ref 98–111)
Creatinine: 0.61 mg/dL (ref 0.61–1.24)
GFR, Estimated: >60 mL/min
Glucose, Bld: 103 mg/dL — ABNORMAL HIGH (ref 70–99)
Potassium: 3.6 mmol/L (ref 3.5–5.1)
Sodium: 134 mmol/L — ABNORMAL LOW (ref 135–145)
Total Bilirubin: 0.8 mg/dL (ref 0.0–1.2)
Total Protein: 7.2 g/dL (ref 6.5–8.1)

## 2024-06-27 LAB — RPR: RPR Ser Ql: NONREACTIVE

## 2024-06-27 NOTE — Assessment & Plan Note (Signed)
 To finish course of Keflex and doxycycline .

## 2024-06-27 NOTE — Progress Notes (Signed)
 Hematology/Oncology Progress note [phone] [phone]      CHIEF COMPLAINTS/PURPOSE OF CONSULTATION:  Stage III Lung cancer  ASSESSMENT & PLAN:   Cancer Staging  Adenocarcinoma of lung [facility]) Staging form: Lung, AJCC 8th Edition - Clinical stage from 03/22/2023: cT1, cN3, cM0 - Signed by Babara Call, MD on 03/22/2023   Adenocarcinoma of lung (HCC) Clinically he has Stage III left lung cancer He has right paratracheal lymph node hypermetabolic activity, although Station 7 lymph node showed atypical cell, no definite malignancy.  Left upper lobe adenocarcinoma, No enough tissue for NGS, liquid biopsy showed actionable variants.   S/p concurrent chemotherapy radiation. -carboplatin  AUC 2 and taxol  45mg /m2  On Durvalumab  maintenance which was held since July due to further workup after July 2025 CT which showed increased right middle lobe lesion.  And patient got radiation to the right middle lobe lesion suspicious for synchronous neoplasm.  Clinically he is doing well today Labs are reviewed and discussed with patient. Hold off Durvalumab  maintenance due to cellulitis.SABRA    COPD (chronic obstructive pulmonary disease) (HCC) Continue albuterol  inhaler PRN, Duonebs.  follow up with pulmonology Dr. Theotis.  Continue Singulair , mucinex .   Pulmonary embolism (HCC) Continue Elqiuis to 2.5mg  BID. He is on Plavix  75mg  daily.    Lower extremity cellulitis To finish course of Keflex and doxycycline .   Orders Placed This Encounter  Procedures   CBC with Differential (Cancer Center Only)    Standing Status:   Future    Expected Date:   07/11/2024    Expiration Date:   07/11/2025   CMP (Cancer Center only)    Standing Status:   Future    Expected Date:   07/11/2024    Expiration Date:   07/11/2025   Follow up 2 weeks All questions were answered. The patient knows to call the clinic with any problems, questions or concerns.  Call Babara, MD, PhD [facility] Hematology  Oncology 06/27/2024    HISTORY OF PRESENTING ILLNESS:  [name] 63 y.o. male presents for follow up of lung cancer.  I have reviewed his chart and materials related to his cancer extensively and collaborated history with the patient. Summary of oncologic history is as follows:  12/08/2021 CT chest with contrast showed irregular solid pulmonary nodule of right upper lobe, increased in size.  Bibasilar consolidation.  Mildly enlarged right lower paratracheal lymph node.  Atherosclerosis. 01/04/2022 PET scan showed 10 mm irregular nodule in the lateral right upper lobe, suspicious for primary bronchogenic neoplasm. 2 small right paratracheal nodes, indeterminate and favored to be reactive.  Attention on follow-up.  Patient was seen by radiation oncology Dr. Lenn.  Patient was offered SBRT to the right upper lobe nodule/presumed non-small cell lung cancer stage I.  Patient lives in a group home.  He reports chronic cough.  Some shortness of breath with exertion. He is a current everyday smoker.  Few cigarettes per day.  He denies any unintentional weight loss, night sweats, fever or chills. Patient is on aspirin  and Plavix    INTERVAL HISTORY [name] is a 63 y.o. male who has above history reviewed by me today presents for follow up visit for lung cancer.  Oncology History  Adenocarcinoma of lung (HCC)  12/29/2022 Imaging   CT chest with contrast showed Stable radiation changes involving the right upper lobe with a small residual treated nodule.  Decrease in size.  Interval enlargement of the left upper lobe pulmonary nodule.  Stable mediastinal nodes stable advanced  emphysematous changes and areas of pulmonary scarring.  Stable bilateral adrenal gland adenomas.  Emphysema   01/30/2023 Imaging   PET 1. Recurrent bronchogenic carcinoma in the left upper lobe with new and increasingly hypermetabolic mediastinal lymph nodes. No evidence of distant metastatic disease. 2. Possible mild  prostate hypermetabolism. Consider laboratory correlation. 3. Liver is mildly heterogeneous, raising suspicion for steatosis. 4. Cholelithiasis. 5. Right adrenal adenoma.  Left adrenal myelolipoma. 6. Aortic atherosclerosis (ICD10-I70.0). Coronary artery calcification. 7. Enlarged pulmonic trunk, indicative of pulmonary arterial hypertension.   03/15/2023 Initial Diagnosis   Adenocarcinoma of left lung   Bronchoscopy showed  Lung, biopsy, Left upper lobe - NON-SMALL CELL CARCINOMA, FAVOR ADENOCARCINOMA.  1. Bronchial Lavage, Left upper lobe - POSITIVE FOR MALIGNANCY. - NON-SMALL CELL CARCINOMA, FAVOR ADENOCARCINOMA. - SEE NOTE. 2. Bronchial Brushing, Left upper lobe - POSITIVE FOR MALIGNANCY. - NON-SMALL CELL CARCINOMA, FAVOR ADENOCARCINOMA. 3. Bronchus, biopsy, left upper lobe - SEE [id number] 4. Fine Needle Aspiration, left upper lobe - POSITIVE FOR MALIGNANCY. - NON-SMALL CELL CARCINOMA, FAVOR ADENOCARCINOMA. 5. Fine Needle Aspiration, station 7 lymph node - ATYPICAL. - BACKGROUND LYMPHOID TISSUE IS PRESENT CONSISTENT WITH LYMPH NODE SAMPLING.    03/15/2023 Procedure   Fiberoptic bronchoscopy with airway inspection and BAL Procedure findings:   Bronchoscope was inserted via ETT  without difficulty.  Posterior oropharynx, epiglottis, arytenoids, false cords and vocal cords were not visualized as these were bypassed by endotracheal tube. The distal trachea was normal in circumference and appearance without mucosal, cartilaginous or branching abnormalities.  The main carina was mildly splayed . All right and left lobar airways were visualized to the Subsegmental level.  Sub- sub segmental carinae were identified in all the distal airways.   Secretions were visible in the following airways and appeared to be clear.  The mucosa was : friable at LEFT UPPER LOBE   Airways were notable for:        exophytic lesions :n       extrinsic compression in the following distributions:  n.       Friable mucosa: y       Teacher, music /pigmentation: n   MUCUS PLUGGING WAS SEVERE ON LEFT SIDE AND COMPLETE OBSTRUCTED MULTIPLE AIRWAYS.  UNABLE TO NAVIGATE THROUGH TRACHEOBRONCHIAL TREE DUE TO SEVERE MUCUS PLUGGING. THERAPEUTIC ASPIRATION WAS PERFORMED X 7 AND WAS DONE BILATERALLY ALTHOUGH WORSE ON LEFT.  BAL WAS PERFORMED AT LEFT UPPER LOBE X 2.     Endobronchial ultrasound assisted hilar and mediastinal lymph node biopsies procedure findings: The fiberoptic bronchoscope was removed and the EBUS scope was introduced. Examination began to evaluate for pathologically enlarged lymph nodes starting on the RIGHT  side progressing to the LEFT side.  All lymph node biopsies performed with 21G  needle. Lymph node biopsies were sent in cytolite for all stations.   STATION 10R - 6mm not biopsied STATION 7 - 1.2cm biopsied 3 times STATION 10L - 5mm not biopsied  STATION 4L - 6mm not biopsied    03/22/2023 Cancer Staging   Staging form: Lung, AJCC 8th Edition - Clinical stage from 03/22/2023: cT1, cN3, cM0 - Signed by Babara Call, MD on 03/22/2023 Stage prefix: Initial diagnosis   04/20/2023 - 06/08/2023 Chemotherapy   Patient is on Treatment Plan : LUNG Carboplatin  + Paclitaxel  + XRT q7d     09/01/2023 -  Chemotherapy   Patient is on Treatment Plan : LUNG Durvalumab  (10) q14d     10/15/2023 - 10/24/2023 Hospital Admission   Hospitalized due to Influenza A,  COPD exacerbation, acute respiratory failure.    11/03/2023 Imaging   CT chest abdomen pelvis w contrast showed CHEST:   1. Stable LEFT upper lobe peribronchial lesion. 2. No evidence of metastatic adenopathy. 3. Stable small RIGHT paratracheal node. 4. Slight improvement in atelectasis in the RIGHT middle lobe.   PELVIS:   1. No evidence of metastatic disease in the abdomen pelvis. 2. Stable small hypodensity in the spleen and liver. 3. Stable benign RIGHT adrenal adenoma and and LEFT adrenal angiomyolipoma.    02/27/2024  Imaging   CT chest w contrast   1. Unchanged spiculated nodule of the central left upper lobe measuring 1.9 x 1.0 cm, consistent with treated primary lung malignancy. 2. Interval enlargement in a lobulated nodule of the lateral segment right middle lobe encasing a small bronchial, measuring 1.2 x 0.8 cm, previously 0.8 x 0.6 cm. This is highly suspicious for synchronous malignancy or metastasis. Given size this is likely amenable to PET-CT characterization. 3. Unchanged bandlike ground-glass in the peripheral right upper lobe. 4. Unchanged right paratracheal lymph node measuring 1.1 x 1.1 cm. No other enlarged mediastinal, hilar, or axillary lymph nodes. 5. Emphysema and diffuse bilateral bronchial wall thickening. 6. Enlargement of the main pulmonary artery, as can be seen in pulmonary hypertension. 7. Coronary artery disease.     Discussed the use of AI scribe software for clinical note transcription with the patient, who gave verbal consent to proceed.   Chronic cough and SOB, he feels breathing status is at baseline today he is on Eliquis  2.5mg  BID and Plavix . No bleeding events.   He has been experiencing significant pain in his feet for the past four days, accompanied by a rash on his toes and feet. The rash is bilateral. He went to emergency room today for evaluation.  Working diagnosis ankle cellulitis.  There is some suspicion of dermatological sign of syphilis. RPR came back negative patient was prescribed Keflex and doxycycline  for seven days.  MEDICAL HISTORY:  Past Medical History:  Diagnosis Date   Acute on chronic respiratory failure with hypoxia (HCC)    Asthma    Coagulation disorder    COPD (chronic obstructive pulmonary disease) (HCC)    Depression    History of hiatal hernia    Hypertension    Hypokalemia    Leukocytosis    Lung mass    Normocytic anemia 07/13/2023   Pneumonia    Schizophrenia (HCC)    Shortness of breath dyspnea    Stroke [facility])     Umbilical hernia    Ventral hernia     SURGICAL HISTORY: Past Surgical History:  Procedure Laterality Date   COLONOSCOPY WITH PROPOFOL  N/A 09/21/2021   Procedure: COLONOSCOPY WITH PROPOFOL ;  Surgeon: Maryruth Ole DASEN, MD;  Location: [facility] ENDOSCOPY;  Service: Endoscopy;  Laterality: N/A;   HERNIA REPAIR     INSERTION OF MESH N/A 12/18/2014   Procedure: INSERTION OF MESH;  Surgeon: Charlie FORBES Fell, MD;  Location: [facility] ORS;  Service: General;  Laterality: N/A;   SUPRA-UMBILICAL HERNIA  12/18/2014   Procedure: SUPRA-UMBILICAL HERNIA;  Surgeon: Charlie FORBES Fell, MD;  Location: [facility] ORS;  Service: General;;   UMBILICAL HERNIA REPAIR N/A 12/18/2014   Procedure: HERNIA REPAIR UMBILICAL ADULT;  Surgeon: Charlie FORBES Fell, MD;  Location: [facility] ORS;  Service: General;  Laterality: N/A;   VIDEO BRONCHOSCOPY WITH ENDOBRONCHIAL ULTRASOUND N/A 03/15/2023   Procedure: VIDEO BRONCHOSCOPY WITH ENDOBRONCHIAL ULTRASOUND;  Surgeon: Parris Manna, MD;  Location: [facility] ORS;  Service: Thoracic;  Laterality: N/A;    SOCIAL HISTORY: Social History   Socioeconomic History   Marital status: Widowed    Spouse name: Not on file   Number of children: Not on file   Years of education: Not on file   Highest education level: Not on file  Occupational History   Not on file  Tobacco Use   Smoking status: Former    Current packs/day: 0.15    Average packs/day: 0.2 packs/day for 45.0 years (6.8 ttl pk-yrs)    Types: Cigarettes   Smokeless tobacco: Never  Vaping Use   Vaping status: Never Used  Substance and Sexual Activity   Alcohol use: Not Currently    Alcohol/week: 75.0 standard drinks of alcohol    Types: 75 Cans of beer per week   Drug use: Not Currently    Types: Marijuana, Crack cocaine    Comment: none in 15 yrs   Sexual activity: Not Currently  Other Topics Concern   Not on file  Social History Narrative   Not on file   Social Drivers of Health   Financial Resource Strain: Medium Risk  (08/18/2023)   Received from [facility]   Overall Financial Resource Strain (CARDIA)    Difficulty of Paying Living Expenses: Somewhat hard  Food Insecurity: No Food Insecurity (10/17/2023)   Hunger Vital Sign    Worried About Running Out of Food in the Last Year: Never true    Ran Out of Food in the Last Year: Never true  Transportation Needs: No Transportation Needs (10/17/2023)   PRAPARE - Administrator, Civil Service (Medical): No    Lack of Transportation (Non-Medical): No  Physical Activity: Not on file  Stress: Not on file  Social Connections: Not on file  Intimate Partner Violence: Not At Risk (10/17/2023)   Humiliation, Afraid, Rape, and Kick questionnaire    Fear of Current or Ex-Partner: No    Emotionally Abused: No    Physically Abused: No    Sexually Abused: No    FAMILY HISTORY: Family History  Problem Relation Age of Onset   Asthma Mother    Hypertension Father     ALLERGIES:  has no known allergies.  MEDICATIONS:  Current Outpatient Medications  Medication Sig Dispense Refill   albuterol  (VENTOLIN  HFA) 108 (90 Base) MCG/ACT inhaler Inhale 1-2 puffs into the lungs every 6 (six) hours as needed for wheezing or shortness of breath. 8 g 0   cephALEXin (KEFLEX) 500 MG capsule Take 1 capsule (500 mg total) by mouth 4 (four) times daily for 7 days. 28 capsule 0   clopidogrel  (PLAVIX ) 75 MG tablet Take 1 tablet (75 mg total) by mouth daily. 30 tablet 0   doxycycline  (VIBRA -TABS) 100 MG tablet Take 1 tablet (100 mg total) by mouth 2 (two) times daily for 7 days. 14 tablet 0   ELIQUIS  2.5 MG TABS tablet TAKE 1 TABLET BY MOUTH TWICE DAILY 36 tablet 10   famotidine  (PEPCID ) 20 MG tablet Take 1 tablet (20 mg total) by mouth daily. 30 tablet 0   FLUoxetine  (PROZAC ) 20 MG capsule Take 1 capsule (20 mg total) by mouth daily. 30 capsule 3   Fluticasone -Umeclidin-Vilant (TRELEGY ELLIPTA ) 100-62.5-25 MCG/ACT AEPB Inhale 1 puff into the lungs daily. 28  each 1   HYDROcodone-acetaminophen  (NORCO/VICODIN) 5-325 MG tablet Take 1 tablet by mouth 3 (three) times daily as needed for up to 3 days for moderate pain (pain score 4-6). 9 tablet 0   ipratropium-albuterol  (DUONEB)  0.5-2.5 (3) MG/3ML SOLN Take 3 mLs by nebulization 3 (three) times daily. 360 mL 1   mirtazapine  (REMERON ) 7.5 MG tablet Take 1 tablet (7.5 mg total) by mouth at bedtime. 30 tablet 5   montelukast  (SINGULAIR ) 10 MG tablet Take 1 tablet (10 mg total) by mouth at bedtime. 30 tablet 1   potassium chloride  (KLOR-CON  M) 10 MEQ tablet Take 1 tablet (10 mEq total) by mouth daily. 30 tablet 1   pravastatin  (PRAVACHOL ) 20 MG tablet Take 1 tablet (20 mg total) by mouth daily at 6 PM. 30 tablet 0   QUEtiapine  (SEROQUEL ) 300 MG tablet Take 2 tablets (600 mg total) by mouth at bedtime. 60 tablet 0   senna-docusate (SENOKOT-S) 8.6-50 MG tablet Take 1 tablet by mouth daily after breakfast. 30 tablet 0   traZODone  (DESYREL ) 100 MG tablet Take 2 tablets (200 mg total) by mouth at bedtime as needed for sleep. 30 tablet 0   guaiFENesin  (MUCINEX ) 600 MG 12 hr tablet Take 1 tablet (600 mg total) by mouth 2 (two) times daily. (Patient not taking: Reported on 06/27/2024) 30 tablet 1   No current facility-administered medications for this visit.    Review of Systems  Constitutional:  Negative for appetite change, chills, fatigue, fever and unexpected weight change.  HENT:   Negative for hearing loss and voice change.   Eyes:  Negative for eye problems and icterus.  Respiratory:  Negative for chest tightness, cough and hemoptysis.   Cardiovascular:  Negative for chest pain and leg swelling.  Gastrointestinal:  Negative for abdominal distention and abdominal pain.  Endocrine: Negative for hot flashes.  Genitourinary:  Negative for difficulty urinating, dysuria and frequency.   Musculoskeletal:  Negative for arthralgias.  Skin:  Negative for itching and rash.  Neurological:  Negative for  light-headedness and numbness.  Hematological:  Negative for adenopathy. Does not bruise/bleed easily.  Psychiatric/Behavioral:  Negative for confusion.      PHYSICAL EXAMINATION: ECOG PERFORMANCE STATUS: 1 - Symptomatic but completely ambulatory  Vitals:   06/27/24 0930  BP: (!) 145/98  Pulse: 93  Resp: 16  Temp: 98.6 F (37 C)  SpO2: 93%   Filed Weights   06/27/24 0930  Weight: 210 lb (95.3 kg)    Physical Exam Constitutional:      General: He is not in acute distress.    Appearance: He is not diaphoretic.  HENT:     Head: Normocephalic and atraumatic.  Eyes:     General: No scleral icterus. Cardiovascular:     Rate and Rhythm: Normal rate and regular rhythm.  Pulmonary:     Effort: Pulmonary effort is normal. No respiratory distress.     Breath sounds: No wheezing.     Comments: Decreased breath sound bilaterally, poor air entry Abdominal:     General: There is no distension.     Palpations: Abdomen is soft.     Tenderness: There is no abdominal tenderness.  Musculoskeletal:        General: Normal range of motion.     Cervical back: Normal range of motion and neck supple.  Skin:    General: Skin is warm and dry.     Findings: No erythema.     Comments: macular rash with scabs on bilateral lower extremities.  Neurological:     Mental Status: He is alert and oriented to person, place, and time. Mental status is at baseline.     Motor: No abnormal muscle tone.  Psychiatric:  Mood and Affect: Affect normal.     Comments: Flat      LABORATORY DATA:  I have reviewed the data as listed    Latest Ref Rng & Units 06/27/2024    9:20 AM 06/26/2024    7:46 PM 03/07/2024    8:57 AM  CBC  WBC 4.0 - 10.5 K/uL 6.2  8.1  5.1   Hemoglobin 13.0 - 17.0 g/dL 88.0  88.8  88.0   Hematocrit 39.0 - 52.0 % 35.5  33.8  36.1   Platelets 150 - 400 K/uL 194  188  177       Latest Ref Rng & Units 06/27/2024    9:20 AM 06/26/2024    7:46 PM 03/07/2024    8:57 AM  CMP   Glucose 70 - 99 mg/dL 896  899  895   BUN 8 - 23 mg/dL 6  5  8    Creatinine 0.61 - 1.24 mg/dL 9.38  9.37  9.27   Sodium 135 - 145 mmol/L 134  136  137   Potassium 3.5 - 5.1 mmol/L 3.6  3.9  3.8   Chloride 98 - 111 mmol/L 103  100  107   CO2 22 - 32 mmol/L 23  27  23    Calcium 8.9 - 10.3 mg/dL 9.0  9.0  9.0   Total Protein 6.5 - 8.1 g/dL 7.2  6.6  6.9   Total Bilirubin 0.0 - 1.2 mg/dL 0.8  0.5  0.6   Alkaline Phos 38 - 126 U/L 74  81  82   AST 15 - 41 U/L 15  15  17    ALT 0 - 44 U/L 11  9  12       RADIOGRAPHIC STUDIES: I have personally reviewed the radiological images as listed and agreed with the findings in the report. No results found.

## 2024-06-27 NOTE — Assessment & Plan Note (Signed)
 Continue Elqiuis to 2.5mg  BID. He is on Plavix  75mg  daily.

## 2024-06-27 NOTE — Assessment & Plan Note (Signed)
 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  Left upper lobe adenocarcinoma, No enough tissue for NGS, liquid biopsy showed actionable variants.   S/p concurrent chemotherapy radiation. -carboplatin  AUC 2 and taxol  45mg /m2  On Durvalumab  maintenance which was held since July due to further workup after July 2025 CT which showed increased right middle lobe lesion.  And patient got radiation to the right middle lobe lesion suspicious for synchronous neoplasm.  Clinically he is doing well today Labs are reviewed and discussed with patient. Hold off Durvalumab  maintenance due to cellulitis.SABRA

## 2024-06-27 NOTE — ED Notes (Addendum)
 This RN called [facility] which is on patient's file. [facility] stated pt is not a resident there and moved to [facility].  Patient chart updated with correct information.

## 2024-06-27 NOTE — Assessment & Plan Note (Signed)
 Continue albuterol inhaler PRN, Duonebs.  follow up with pulmonology Dr. Meredeth Ide.  Continue Singulair, mucinex.

## 2024-06-27 NOTE — ED Notes (Signed)
 This RN called [facility]. No answer and voice mailbox full.

## 2024-06-29 NOTE — Progress Notes (Signed)
  Subjective:  Patient ID: [name], male    DOB: [birth year],  MRN: [id number]  [name] presents to clinic today for at risk foot care with h/o coagulation defect and painful mycotic toenails of both feet that are difficult to trim. Pain interferes with daily activities and wearing enclosed shoe gear comfortably.   New problem(s): None.   PCP is Aycock, Ngwe A, MD.  No Known Allergies  Review of Systems: Negative except as noted in the HPI.  Objective: No changes noted in today's physical examination. There were no vitals filed for this visit. [name] is a pleasant 63 y.o. male in NAD. AAO x 3.  Vascular Examination: Capillary refill time immediate b/l. Vascular status intact b/l with palpable pedal pulses. Pedal hair present b/l. No pain with calf compression b/l. Skin temperature gradient WNL b/l. No cyanosis or clubbing b/l. No ischemia or gangrene noted b/l.   Neurological Examination: Sensation grossly intact b/l with 10 gram monofilament. Vibratory sensation intact b/l.   Dermatological Examination: Xerosis b/l LE.   No open wounds. No interdigital macerations.   Toenails 1-5 b/l thick, discolored, elongated with subungual debris and pain on dorsal palpation.   No corns, calluses, nor porokeratotic lesions.  Musculoskeletal Examination: Normal muscle strength 5/5 to all lower extremity muscle groups bilaterally. No pain, crepitus or joint limitation noted with ROM b/l LE. No gross bony pedal deformities b/l. Patient ambulates independently without assistive aids.  Radiographs: None Assessment/Plan: 1. Pain due to onychomycosis of toenails of both feet   2. Coagulation disorder    -Consent given for treatment as described below: -Examined patient. -Patient to continue soft, supportive shoe gear daily. -Toenails 1-5 b/l were debrided in length and girth with sterile nail nippers and dremel without iatrogenic bleeding.  -For dry skin, recommended OTC  moisturizers. Patient/POA instructed to apply to foot/feet once daily avoiding application between toes.  -Patient/POA to call should there be question/concern in the interim.   Return in about 3 months (around 09/20/2024).  Delon LITTIE Merlin, DPM      [place] LOCATION: [address], [place] [address]                   Office [phone]   [facility] LOCATION: [address] [place], [place] [address] Office [phone]

## 2024-07-11 ENCOUNTER — Inpatient Hospital Stay: Admitting: Oncology

## 2024-07-11 ENCOUNTER — Inpatient Hospital Stay: Attending: Oncology

## 2024-07-11 ENCOUNTER — Inpatient Hospital Stay

## 2024-07-11 ENCOUNTER — Encounter: Payer: Self-pay | Admitting: Oncology

## 2024-07-11 VITALS — BP 148/85 | HR 86 | Temp 96.0°F | Resp 18 | Wt 213.0 lb

## 2024-07-11 DIAGNOSIS — Z79899 Other long term (current) drug therapy: Secondary | ICD-10-CM | POA: Diagnosis not present

## 2024-07-11 DIAGNOSIS — D3502 Benign neoplasm of left adrenal gland: Secondary | ICD-10-CM | POA: Diagnosis not present

## 2024-07-11 DIAGNOSIS — Z87891 Personal history of nicotine dependence: Secondary | ICD-10-CM | POA: Insufficient documentation

## 2024-07-11 DIAGNOSIS — Z8673 Personal history of transient ischemic attack (TIA), and cerebral infarction without residual deficits: Secondary | ICD-10-CM | POA: Diagnosis not present

## 2024-07-11 DIAGNOSIS — C3492 Malignant neoplasm of unspecified part of left bronchus or lung: Secondary | ICD-10-CM

## 2024-07-11 DIAGNOSIS — J4489 Other specified chronic obstructive pulmonary disease: Secondary | ICD-10-CM | POA: Insufficient documentation

## 2024-07-11 DIAGNOSIS — I2699 Other pulmonary embolism without acute cor pulmonale: Secondary | ICD-10-CM

## 2024-07-11 DIAGNOSIS — Z7902 Long term (current) use of antithrombotics/antiplatelets: Secondary | ICD-10-CM | POA: Insufficient documentation

## 2024-07-11 DIAGNOSIS — Z5112 Encounter for antineoplastic immunotherapy: Secondary | ICD-10-CM | POA: Insufficient documentation

## 2024-07-11 DIAGNOSIS — K7689 Other specified diseases of liver: Secondary | ICD-10-CM | POA: Insufficient documentation

## 2024-07-11 DIAGNOSIS — L03119 Cellulitis of unspecified part of limb: Secondary | ICD-10-CM | POA: Diagnosis not present

## 2024-07-11 DIAGNOSIS — J439 Emphysema, unspecified: Secondary | ICD-10-CM | POA: Diagnosis not present

## 2024-07-11 DIAGNOSIS — E876 Hypokalemia: Secondary | ICD-10-CM

## 2024-07-11 DIAGNOSIS — F209 Schizophrenia, unspecified: Secondary | ICD-10-CM | POA: Insufficient documentation

## 2024-07-11 DIAGNOSIS — D3501 Benign neoplasm of right adrenal gland: Secondary | ICD-10-CM | POA: Insufficient documentation

## 2024-07-11 DIAGNOSIS — C3412 Malignant neoplasm of upper lobe, left bronchus or lung: Secondary | ICD-10-CM | POA: Insufficient documentation

## 2024-07-11 DIAGNOSIS — B353 Tinea pedis: Secondary | ICD-10-CM | POA: Diagnosis not present

## 2024-07-11 DIAGNOSIS — I2510 Atherosclerotic heart disease of native coronary artery without angina pectoris: Secondary | ICD-10-CM | POA: Diagnosis not present

## 2024-07-11 DIAGNOSIS — L270 Generalized skin eruption due to drugs and medicaments taken internally: Secondary | ICD-10-CM | POA: Insufficient documentation

## 2024-07-11 DIAGNOSIS — I700 Atherosclerosis of aorta: Secondary | ICD-10-CM | POA: Insufficient documentation

## 2024-07-11 DIAGNOSIS — L299 Pruritus, unspecified: Secondary | ICD-10-CM | POA: Diagnosis not present

## 2024-07-11 DIAGNOSIS — K8020 Calculus of gallbladder without cholecystitis without obstruction: Secondary | ICD-10-CM | POA: Diagnosis not present

## 2024-07-11 DIAGNOSIS — J449 Chronic obstructive pulmonary disease, unspecified: Secondary | ICD-10-CM

## 2024-07-11 DIAGNOSIS — I10 Essential (primary) hypertension: Secondary | ICD-10-CM | POA: Insufficient documentation

## 2024-07-11 DIAGNOSIS — Z923 Personal history of irradiation: Secondary | ICD-10-CM | POA: Insufficient documentation

## 2024-07-11 DIAGNOSIS — I272 Pulmonary hypertension, unspecified: Secondary | ICD-10-CM | POA: Insufficient documentation

## 2024-07-11 LAB — CBC WITH DIFFERENTIAL (CANCER CENTER ONLY)
Abs Immature Granulocytes: 0.03 K/uL (ref 0.00–0.07)
Basophils Absolute: 0 K/uL (ref 0.0–0.1)
Basophils Relative: 0 %
Eosinophils Absolute: 0.1 K/uL (ref 0.0–0.5)
Eosinophils Relative: 2 %
HCT: 35.8 % — ABNORMAL LOW (ref 39.0–52.0)
Hemoglobin: 11.8 g/dL — ABNORMAL LOW (ref 13.0–17.0)
Immature Granulocytes: 0 %
Lymphocytes Relative: 13 %
Lymphs Abs: 0.9 K/uL (ref 0.7–4.0)
MCH: 28.7 pg (ref 26.0–34.0)
MCHC: 33 g/dL (ref 30.0–36.0)
MCV: 87.1 fL (ref 80.0–100.0)
Monocytes Absolute: 0.6 K/uL (ref 0.1–1.0)
Monocytes Relative: 8 %
Neutro Abs: 5.3 K/uL (ref 1.7–7.7)
Neutrophils Relative %: 77 %
Platelet Count: 248 K/uL (ref 150–400)
RBC: 4.11 MIL/uL — ABNORMAL LOW (ref 4.22–5.81)
RDW: 13.9 % (ref 11.5–15.5)
WBC Count: 7 K/uL (ref 4.0–10.5)
nRBC: 0 % (ref 0.0–0.2)

## 2024-07-11 LAB — CMP (CANCER CENTER ONLY)
ALT: 6 U/L (ref 0–44)
AST: 16 U/L (ref 15–41)
Albumin: 3.9 g/dL (ref 3.5–5.0)
Alkaline Phosphatase: 74 U/L (ref 38–126)
Anion gap: 10 (ref 5–15)
BUN: <5 mg/dL — ABNORMAL LOW (ref 8–23)
CO2: 28 mmol/L (ref 22–32)
Calcium: 9.4 mg/dL (ref 8.9–10.3)
Chloride: 100 mmol/L (ref 98–111)
Creatinine: 0.67 mg/dL (ref 0.61–1.24)
GFR, Estimated: >60 mL/min (ref >60)
Glucose, Bld: 121 mg/dL — ABNORMAL HIGH (ref 70–99)
Potassium: 3.3 mmol/L — ABNORMAL LOW (ref 3.5–5.1)
Sodium: 139 mmol/L (ref 135–145)
Total Bilirubin: 0.4 mg/dL (ref 0.0–1.2)
Total Protein: 7.2 g/dL (ref 6.5–8.1)

## 2024-07-11 MED ORDER — DIPHENHYDRAMINE HCL 50 MG PO TABS: 50.0000 mg | ORAL_TABLET | Freq: Three times a day (TID) | ORAL | 0 refills | Status: AC | PRN

## 2024-07-11 MED ORDER — APIXABAN 2.5 MG PO TABS: 2.5000 mg | ORAL_TABLET | Freq: Two times a day (BID) | ORAL | 10 refills | Status: AC

## 2024-07-11 MED ORDER — POTASSIUM CHLORIDE CRYS ER 20 MEQ PO TBCR: 20.0000 meq | EXTENDED_RELEASE_TABLET | Freq: Every day | ORAL | 0 refills | Status: DC

## 2024-07-11 MED ORDER — PREDNISONE 10 MG (21) PO TBPK: ORAL_TABLET | ORAL | 0 refills | Status: DC

## 2024-07-11 NOTE — Progress Notes (Signed)
 Hematology/Oncology Progress note [phone] [phone]) [phone]      CHIEF COMPLAINTS/PURPOSE OF CONSULTATION:  Stage III Lung cancer  ASSESSMENT & PLAN:   Cancer Staging  Adenocarcinoma of lung [facility]) Staging form: Lung, AJCC 8th Edition - Clinical stage from 03/22/2023: cT1, cN3, cM0 - Signed by Babara Call, MD on 03/22/2023   Adenocarcinoma of lung (HCC) Clinically he has Stage III left lung cancer He has right paratracheal lymph node hypermetabolic activity, although Station 7 lymph node showed atypical cell, no definite malignancy.  Left upper lobe adenocarcinoma, No enough tissue for NGS, liquid biopsy showed actionable variants.   S/p concurrent chemotherapy radiation. -carboplatin  AUC 2 and taxol  45mg /m2  On Durvalumab  maintenance which was held since July due to further workup after July 2025 CT which showed increased right middle lobe lesion.  And patient got radiation to the right middle lobe lesion suspicious for synchronous neoplasm.  Clinically he is doing well today Labs are reviewed and discussed with patient. Hold off Durvalumab  maintenance due to rash due to antibiotics   COPD (chronic obstructive pulmonary disease) (HCC) Continue albuterol  inhaler PRN, Duonebs.  follow up with pulmonology Dr. Theotis.  Continue Singulair , mucinex .   Pulmonary embolism (HCC) Continue Elqiuis to 2.5mg  BID. He is on Plavix  75mg  daily.    Drug-induced skin rash Due to recent antibiotics. He has not been on Durvalumab  since July 2025 so this is not due to immunotherapy Recommend a tapering course of prednisone , benadryl  PRN.    Hypokalemia Recommend  potassium chloride  20meq daily for 1 week    Orders Placed This Encounter  Procedures   CBC with Differential (Cancer Center Only)    Standing Status:   Future    Expected Date:   07/25/2024    Expiration Date:   07/25/2025   CMP (Cancer Center only)    Standing Status:   Future    Expected Date:   07/25/2024     Expiration Date:   07/25/2025   Follow up 2 weeks All questions were answered. The patient knows to call the clinic with any problems, questions or concerns.  Call Babara, MD, PhD [facility] Hematology Oncology 07/11/2024    HISTORY OF PRESENTING ILLNESS:  [name] 63 y.o. male presents for follow up of lung cancer.  I have reviewed his chart and materials related to his cancer extensively and collaborated history with the patient. Summary of oncologic history is as follows:  12/08/2021 CT chest with contrast showed irregular solid pulmonary nodule of right upper lobe, increased in size.  Bibasilar consolidation.  Mildly enlarged right lower paratracheal lymph node.  Atherosclerosis. 01/04/2022 PET scan showed 10 mm irregular nodule in the lateral right upper lobe, suspicious for primary bronchogenic neoplasm. 2 small right paratracheal nodes, indeterminate and favored to be reactive.  Attention on follow-up.  Patient was seen by radiation oncology Dr. Lenn.  Patient was offered SBRT to the right upper lobe nodule/presumed non-small cell lung cancer stage I.  Patient lives in a group home.  He reports chronic cough.  Some shortness of breath with exertion. He is a current everyday smoker.  Few cigarettes per day.  He denies any unintentional weight loss, night sweats, fever or chills. Patient is on aspirin  and Plavix    INTERVAL HISTORY [name] is a 63 y.o. male who has above history reviewed by me today presents for follow up visit for lung cancer.  Oncology History  Adenocarcinoma of lung (HCC)  12/29/2022 Imaging   CT chest  with contrast showed Stable radiation changes involving the right upper lobe with a small residual treated nodule.  Decrease in size.  Interval enlargement of the left upper lobe pulmonary nodule.  Stable mediastinal nodes stable advanced emphysematous changes and areas of pulmonary scarring.  Stable bilateral adrenal gland adenomas.  Emphysema    01/30/2023 Imaging   PET 1. Recurrent bronchogenic carcinoma in the left upper lobe with new and increasingly hypermetabolic mediastinal lymph nodes. No evidence of distant metastatic disease. 2. Possible mild prostate hypermetabolism. Consider laboratory correlation. 3. Liver is mildly heterogeneous, raising suspicion for steatosis. 4. Cholelithiasis. 5. Right adrenal adenoma.  Left adrenal myelolipoma. 6. Aortic atherosclerosis (ICD10-I70.0). Coronary artery calcification. 7. Enlarged pulmonic trunk, indicative of pulmonary arterial hypertension.   03/15/2023 Initial Diagnosis   Adenocarcinoma of left lung   Bronchoscopy showed  Lung, biopsy, Left upper lobe - NON-SMALL CELL CARCINOMA, FAVOR ADENOCARCINOMA.  1. Bronchial Lavage, Left upper lobe - POSITIVE FOR MALIGNANCY. - NON-SMALL CELL CARCINOMA, FAVOR ADENOCARCINOMA. - SEE NOTE. 2. Bronchial Brushing, Left upper lobe - POSITIVE FOR MALIGNANCY. - NON-SMALL CELL CARCINOMA, FAVOR ADENOCARCINOMA. 3. Bronchus, biopsy, left upper lobe - SEE [id number] 4. Fine Needle Aspiration, left upper lobe - POSITIVE FOR MALIGNANCY. - NON-SMALL CELL CARCINOMA, FAVOR ADENOCARCINOMA. 5. Fine Needle Aspiration, station 7 lymph node - ATYPICAL. - BACKGROUND LYMPHOID TISSUE IS PRESENT CONSISTENT WITH LYMPH NODE SAMPLING.    03/15/2023 Procedure   Fiberoptic bronchoscopy with airway inspection and BAL Procedure findings:   Bronchoscope was inserted via ETT  without difficulty.  Posterior oropharynx, epiglottis, arytenoids, false cords and vocal cords were not visualized as these were bypassed by endotracheal tube. The distal trachea was normal in circumference and appearance without mucosal, cartilaginous or branching abnormalities.  The main carina was mildly splayed . All right and left lobar airways were visualized to the Subsegmental level.  Sub- sub segmental carinae were identified in all the distal airways.   Secretions were visible in the  following airways and appeared to be clear.  The mucosa was : friable at LEFT UPPER LOBE   Airways were notable for:        exophytic lesions :n       extrinsic compression in the following distributions: n.       Friable mucosa: y       Teacher, music /pigmentation: n   MUCUS PLUGGING WAS SEVERE ON LEFT SIDE AND COMPLETE OBSTRUCTED MULTIPLE AIRWAYS.  UNABLE TO NAVIGATE THROUGH TRACHEOBRONCHIAL TREE DUE TO SEVERE MUCUS PLUGGING. THERAPEUTIC ASPIRATION WAS PERFORMED X 7 AND WAS DONE BILATERALLY ALTHOUGH WORSE ON LEFT.  BAL WAS PERFORMED AT LEFT UPPER LOBE X 2.     Endobronchial ultrasound assisted hilar and mediastinal lymph node biopsies procedure findings: The fiberoptic bronchoscope was removed and the EBUS scope was introduced. Examination began to evaluate for pathologically enlarged lymph nodes starting on the RIGHT  side progressing to the LEFT side.  All lymph node biopsies performed with 21G  needle. Lymph node biopsies were sent in cytolite for all stations.   STATION 10R - 6mm not biopsied STATION 7 - 1.2cm biopsied 3 times STATION 10L - 5mm not biopsied  STATION 4L - 6mm not biopsied    03/22/2023 Cancer Staging   Staging form: Lung, AJCC 8th Edition - Clinical stage from 03/22/2023: cT1, cN3, cM0 - Signed by Babara Call, MD on 03/22/2023 Stage prefix: Initial diagnosis   04/20/2023 - 06/08/2023 Chemotherapy   Patient is on Treatment Plan : LUNG Carboplatin  + Paclitaxel  + XRT  q7d     09/01/2023 -  Chemotherapy   Patient is on Treatment Plan : LUNG Durvalumab  (10) q14d     10/15/2023 - 10/24/2023 Hospital Admission   Hospitalized due to Influenza A, COPD exacerbation, acute respiratory failure.    11/03/2023 Imaging   CT chest abdomen pelvis w contrast showed CHEST:   1. Stable LEFT upper lobe peribronchial lesion. 2. No evidence of metastatic adenopathy. 3. Stable small RIGHT paratracheal node. 4. Slight improvement in atelectasis in the RIGHT middle lobe.   PELVIS:    1. No evidence of metastatic disease in the abdomen pelvis. 2. Stable small hypodensity in the spleen and liver. 3. Stable benign RIGHT adrenal adenoma and and LEFT adrenal angiomyolipoma.    02/27/2024 Imaging   CT chest w contrast   1. Unchanged spiculated nodule of the central left upper lobe measuring 1.9 x 1.0 cm, consistent with treated primary lung malignancy. 2. Interval enlargement in a lobulated nodule of the lateral segment right middle lobe encasing a small bronchial, measuring 1.2 x 0.8 cm, previously 0.8 x 0.6 cm. This is highly suspicious for synchronous malignancy or metastasis. Given size this is likely amenable to PET-CT characterization. 3. Unchanged bandlike ground-glass in the peripheral right upper lobe. 4. Unchanged right paratracheal lymph node measuring 1.1 x 1.1 cm. No other enlarged mediastinal, hilar, or axillary lymph nodes. 5. Emphysema and diffuse bilateral bronchial wall thickening. 6. Enlargement of the main pulmonary artery, as can be seen in pulmonary hypertension. 7. Coronary artery disease.   03/18/2024 Imaging   PET scan showed  1. Hypermetabolic 10 mm lateral segment right middle lobe nodule is worrisome for bronchogenic carcinoma. 2. Spiculated left upper lobe nodule and low right paratracheal lymph node have decreased in metabolism in the interval, compatible with treatment response. 3. Focal nodular hypermetabolism in the prostate. Malignancy cannot be excluded. 4. Right adrenal adenoma.  Left adrenal myelolipoma. 5. Aortic atherosclerosis (ICD10-I70.0). Coronary artery calcification. 6. Enlarged pulmonic trunk, indicative of pulmonary arterial hypertension. 7.  Emphysema (ICD10-J43.9).   05/01/2024 - 05/08/2024 Radiation Therapy   Patient completed SBRT to right middle lobe lesion     Discussed the use of AI scribe software for clinical note transcription with the patient, who gave verbal consent to proceed.   Chronic cough and  SOB, he feels breathing status is at baseline today he is on Eliquis  2.5mg  BID and Plavix . No bleeding events.   He has been experiencing significant pain in his feet for the past four days, accompanied by a rash on his toes and feet. The rash is bilateral. He went to emergency room today for evaluation.  Working diagnosis ankle cellulitis.  There is some suspicion of dermatological sign of syphilis. RPR came back negative patient was prescribed Keflex  and doxycycline  for seven days.  Today he reports finished course of antibiotics, lower extremity pain has improved.  He has experienced swelling of the feet for the past two weeks.   He reports  rash that developed after starting the antibiotics. The rash is itchy, and he has not been taking any antihistamines like Benadryl . He has been using Atarax  for itchiness.  He has previously taken potassium supplements but stopped. He is not currently taking any new medications   MEDICAL HISTORY:  Past Medical History:  Diagnosis Date   Acute on chronic respiratory failure with hypoxia (HCC)    Asthma    Coagulation disorder    COPD (chronic obstructive pulmonary disease) (HCC)    Depression  History of hiatal hernia    Hypertension    Hypokalemia    Leukocytosis    Lung mass    Normocytic anemia 07/13/2023   Pneumonia    Schizophrenia (HCC)    Shortness of breath dyspnea    Stroke [facility])    Umbilical hernia    Ventral hernia     SURGICAL HISTORY: Past Surgical History:  Procedure Laterality Date   COLONOSCOPY WITH PROPOFOL  N/A 09/21/2021   Procedure: COLONOSCOPY WITH PROPOFOL ;  Surgeon: Maryruth Ole DASEN, MD;  Location: [facility] ENDOSCOPY;  Service: Endoscopy;  Laterality: N/A;   HERNIA REPAIR     INSERTION OF MESH N/A 12/18/2014   Procedure: INSERTION OF MESH;  Surgeon: Charlie FORBES Fell, MD;  Location: [facility] ORS;  Service: General;  Laterality: N/A;   SUPRA-UMBILICAL HERNIA  12/18/2014   Procedure: SUPRA-UMBILICAL HERNIA;  Surgeon:  Charlie FORBES Fell, MD;  Location: [facility] ORS;  Service: General;;   UMBILICAL HERNIA REPAIR N/A 12/18/2014   Procedure: HERNIA REPAIR UMBILICAL ADULT;  Surgeon: Charlie FORBES Fell, MD;  Location: [facility] ORS;  Service: General;  Laterality: N/A;   VIDEO BRONCHOSCOPY WITH ENDOBRONCHIAL ULTRASOUND N/A 03/15/2023   Procedure: VIDEO BRONCHOSCOPY WITH ENDOBRONCHIAL ULTRASOUND;  Surgeon: Parris Manna, MD;  Location: [facility] ORS;  Service: Thoracic;  Laterality: N/A;    SOCIAL HISTORY: Social History   Socioeconomic History   Marital status: Widowed    Spouse name: Not on file   Number of children: Not on file   Years of education: Not on file   Highest education level: Not on file  Occupational History   Not on file  Tobacco Use   Smoking status: Former    Current packs/day: 0.15    Average packs/day: 0.2 packs/day for 45.0 years (6.8 ttl pk-yrs)    Types: Cigarettes   Smokeless tobacco: Never  Vaping Use   Vaping status: Never Used  Substance and Sexual Activity   Alcohol use: Not Currently    Alcohol/week: 75.0 standard drinks of alcohol    Types: 75 Cans of beer per week   Drug use: Not Currently    Types: Marijuana, Crack cocaine    Comment: none in 15 yrs   Sexual activity: Not Currently  Other Topics Concern   Not on file  Social History Narrative   Not on file   Social Drivers of Health   Financial Resource Strain: Medium Risk (08/18/2023)   Received from [facility]   Overall Financial Resource Strain (CARDIA)    Difficulty of Paying Living Expenses: Somewhat hard  Food Insecurity: No Food Insecurity (10/17/2023)   Hunger Vital Sign    Worried About Running Out of Food in the Last Year: Never true    Ran Out of Food in the Last Year: Never true  Transportation Needs: No Transportation Needs (10/17/2023)   PRAPARE - Administrator, Civil Service (Medical): No    Lack of Transportation (Non-Medical): No  Physical Activity: Not on file  Stress:  Not on file  Social Connections: Not on file  Intimate Partner Violence: Not At Risk (10/17/2023)   Humiliation, Afraid, Rape, and Kick questionnaire    Fear of Current or Ex-Partner: No    Emotionally Abused: No    Physically Abused: No    Sexually Abused: No    FAMILY HISTORY: Family History  Problem Relation Age of Onset   Asthma Mother    Hypertension Father     ALLERGIES:  has no known allergies.  MEDICATIONS:  Current Outpatient Medications  Medication Sig Dispense Refill   albuterol  (VENTOLIN  HFA) 108 (90 Base) MCG/ACT inhaler Inhale 1-2 puffs into the lungs every 6 (six) hours as needed for wheezing or shortness of breath. 8 g 0   clopidogrel  (PLAVIX ) 75 MG tablet Take 1 tablet (75 mg total) by mouth daily. 30 tablet 0   diphenhydrAMINE  (BENADRYL ) 50 MG tablet Take 1 tablet (50 mg total) by mouth every 8 (eight) hours as needed for itching. Take 1 tablet the bedtime the night before chemo and the night of chemo. 60 tablet 0   famotidine  (PEPCID ) 20 MG tablet Take 1 tablet (20 mg total) by mouth daily. 30 tablet 0   FLUoxetine  (PROZAC ) 20 MG capsule Take 1 capsule (20 mg total) by mouth daily. 30 capsule 3   Fluticasone -Umeclidin-Vilant (TRELEGY ELLIPTA ) 100-62.5-25 MCG/ACT AEPB Inhale 1 puff into the lungs daily. 28 each 1   hydrOXYzine  (ATARAX ) 10 MG tablet Take 10 mg by mouth 3 (three) times daily.     INGREZZA 40 MG capsule Take 40 mg by mouth daily.     ipratropium-albuterol  (DUONEB) 0.5-2.5 (3) MG/3ML SOLN Take 3 mLs by nebulization 3 (three) times daily. 360 mL 1   mirtazapine  (REMERON ) 7.5 MG tablet Take 1 tablet (7.5 mg total) by mouth at bedtime. 30 tablet 5   montelukast  (SINGULAIR ) 10 MG tablet Take 1 tablet (10 mg total) by mouth at bedtime. 30 tablet 1   potassium chloride  SA (KLOR-CON  M) 20 MEQ tablet Take 1 tablet (20 mEq total) by mouth daily. 7 tablet 0   pravastatin  (PRAVACHOL ) 20 MG tablet Take 1 tablet (20 mg total) by mouth daily at 6 PM. 30 tablet 0    predniSONE  (STERAPRED UNI-PAK 21 TAB) 10 MG (21) TBPK tablet Day 1 take 6 tablets, Day 2 take 5 tablets, Day 3 take 4 tablets, Day 4 take 3 tablets, Day 5 take 2 tablets D6 take 1 tablet 1 each 0   QUEtiapine  (SEROQUEL ) 300 MG tablet Take 2 tablets (600 mg total) by mouth at bedtime. 60 tablet 0   senna-docusate (SENOKOT-S) 8.6-50 MG tablet Take 1 tablet by mouth daily after breakfast. 30 tablet 0   traZODone  (DESYREL ) 100 MG tablet Take 2 tablets (200 mg total) by mouth at bedtime as needed for sleep. 30 tablet 0   apixaban  (ELIQUIS ) 2.5 MG TABS tablet Take 1 tablet (2.5 mg total) by mouth 2 (two) times daily. 60 tablet 10   guaiFENesin  (MUCINEX ) 600 MG 12 hr tablet Take 1 tablet (600 mg total) by mouth 2 (two) times daily. (Patient not taking: Reported on 07/11/2024) 30 tablet 1   No current facility-administered medications for this visit.    Review of Systems  Constitutional:  Negative for appetite change, chills, fatigue, fever and unexpected weight change.  HENT:   Negative for hearing loss and voice change.   Eyes:  Negative for eye problems and icterus.  Respiratory:  Negative for chest tightness, cough and hemoptysis.   Cardiovascular:  Negative for chest pain and leg swelling.  Gastrointestinal:  Negative for abdominal distention and abdominal pain.  Endocrine: Negative for hot flashes.  Genitourinary:  Negative for difficulty urinating, dysuria and frequency.   Musculoskeletal:  Negative for arthralgias.  Skin:  Negative for itching and rash.  Neurological:  Negative for light-headedness and numbness.  Hematological:  Negative for adenopathy. Does not bruise/bleed easily.  Psychiatric/Behavioral:  Negative for confusion.      PHYSICAL EXAMINATION: ECOG PERFORMANCE STATUS: 1 - Symptomatic but  completely ambulatory  Vitals:   07/11/24 1335  BP: (!) 148/85  Pulse: 86  Resp: 18  Temp: (!) 96 F (35.6 C)  SpO2: 98%   Filed Weights   07/11/24 1335  Weight: 213 lb (96.6  kg)    Physical Exam Constitutional:      General: He is not in acute distress.    Appearance: He is not diaphoretic.  HENT:     Head: Normocephalic and atraumatic.  Eyes:     General: No scleral icterus. Cardiovascular:     Rate and Rhythm: Normal rate and regular rhythm.  Pulmonary:     Effort: Pulmonary effort is normal. No respiratory distress.     Breath sounds: No wheezing.     Comments: Decreased breath sound bilaterally, poor air entry Abdominal:     General: There is no distension.     Palpations: Abdomen is soft.     Tenderness: There is no abdominal tenderness.  Musculoskeletal:        General: Normal range of motion.     Cervical back: Normal range of motion and neck supple.  Skin:    General: Skin is warm and dry.     Findings: Rash present. No erythema.  Neurological:     Mental Status: He is alert and oriented to person, place, and time. Mental status is at baseline.     Motor: No abnormal muscle tone.  Psychiatric:        Mood and Affect: Affect normal.     Comments: Flat      LABORATORY DATA:  I have reviewed the data as listed    Latest Ref Rng & Units 07/11/2024   12:57 PM 06/27/2024    9:20 AM 06/26/2024    7:46 PM  CBC  WBC 4.0 - 10.5 K/uL 7.0  6.2  8.1   Hemoglobin 13.0 - 17.0 g/dL 88.1  88.0  88.8   Hematocrit 39.0 - 52.0 % 35.8  35.5  33.8   Platelets 150 - 400 K/uL 248  194  188       Latest Ref Rng & Units 07/11/2024   12:57 PM 06/27/2024    9:20 AM 06/26/2024    7:46 PM  CMP  Glucose 70 - 99 mg/dL 878  896  899   BUN 8 - 23 mg/dL 5  6  5    Creatinine 0.61 - 1.24 mg/dL 9.32  9.38  9.37   Sodium 135 - 145 mmol/L 139  134  136   Potassium 3.5 - 5.1 mmol/L 3.3  3.6  3.9   Chloride 98 - 111 mmol/L 100  103  100   CO2 22 - 32 mmol/L 28  23  27    Calcium 8.9 - 10.3 mg/dL 9.4  9.0  9.0   Total Protein 6.5 - 8.1 g/dL 7.2  7.2  6.6   Total Bilirubin 0.0 - 1.2 mg/dL 0.4  0.8  0.5   Alkaline Phos 38 - 126 U/L 74  74  81   AST 15 - 41 U/L  16  15  15    ALT 0 - 44 U/L 6  11  9       RADIOGRAPHIC STUDIES: I have personally reviewed the radiological images as listed and agreed with the findings in the report. No results found.

## 2024-07-11 NOTE — Assessment & Plan Note (Signed)
 Continue Elqiuis to 2.5mg  BID. He is on Plavix  75mg  daily.

## 2024-07-11 NOTE — Assessment & Plan Note (Signed)
 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  Left upper lobe adenocarcinoma, No enough tissue for NGS, liquid biopsy showed actionable variants.   S/p concurrent chemotherapy radiation. -carboplatin  AUC 2 and taxol  45mg /m2  On Durvalumab  maintenance which was held since July due to further workup after July 2025 CT which showed increased right middle lobe lesion.  And patient got radiation to the right middle lobe lesion suspicious for synchronous neoplasm.  Clinically he is doing well today Labs are reviewed and discussed with patient. Hold off Durvalumab  maintenance due to rash due to antibiotics

## 2024-07-11 NOTE — Assessment & Plan Note (Signed)
 Continue albuterol inhaler PRN, Duonebs.  follow up with pulmonology Dr. Meredeth Ide.  Continue Singulair, mucinex.

## 2024-07-11 NOTE — Assessment & Plan Note (Signed)
 Recommend  potassium chloride  20meq daily for 1 week

## 2024-07-11 NOTE — Assessment & Plan Note (Signed)
 Due to recent antibiotics. He has not been on Durvalumab  since July 2025 so this is not due to immunotherapy Recommend a tapering course of prednisone , benadryl  PRN.

## 2024-07-16 ENCOUNTER — Encounter: Payer: Self-pay | Admitting: Podiatry

## 2024-07-16 ENCOUNTER — Ambulatory Visit: Admitting: Podiatry

## 2024-07-16 DIAGNOSIS — M76821 Posterior tibial tendinitis, right leg: Secondary | ICD-10-CM | POA: Diagnosis not present

## 2024-07-16 DIAGNOSIS — M76822 Posterior tibial tendinitis, left leg: Secondary | ICD-10-CM | POA: Diagnosis not present

## 2024-07-16 DIAGNOSIS — B353 Tinea pedis: Secondary | ICD-10-CM

## 2024-07-16 MED ORDER — CLOTRIMAZOLE-BETAMETHASONE 1-0.05 % EX CREA: 1.0000 | TOPICAL_CREAM | Freq: Every day | CUTANEOUS | 0 refills | Status: AC

## 2024-07-16 NOTE — Progress Notes (Unsigned)
 Subjective:  Patient ID: [name], male    DOB: 1960-10-05,  MRN: [id number]  Chief Complaint  Patient presents with   Foot Pain    went to ER for cellulitis. He has had swelling and peeling x 2 weeks. Very painful. No trauma or drainage.    63 y.o. male presents with the above complaint.  Patient presents with complaint of bilateral medial foot pain.  He states he has swelling and skin peeling for last 2 weeks.  Very painful he went to the emergency cellulitis with cellulitis present for more drainage wanted get it evaluated pain scale 7 out of 10 dull aching nature.  Pain would like to discuss treatment options for this.  He also has itching to the bottom of his feet.   Review of Systems: Negative except as noted in the HPI. Denies N/V/F/Ch.  Past Medical History:  Diagnosis Date   Acute on chronic respiratory failure with hypoxia (HCC)    Asthma    Coagulation disorder    COPD (chronic obstructive pulmonary disease) (HCC)    Depression    History of hiatal hernia    Hypertension    Hypokalemia    Leukocytosis    Lung mass    Normocytic anemia 07/13/2023   Pneumonia    Schizophrenia (HCC)    Shortness of breath dyspnea    Stroke [facility])    Umbilical hernia    Ventral hernia    Current Medications[1]  Tobacco Use History[2]  Allergies[3] Objective:  There were no vitals filed for this visit. There is no height or weight on file to calculate BMI. Constitutional Well developed. Well nourished.  Vascular Dorsalis pedis pulses palpable bilaterally. Posterior tibial pulses palpable bilaterally. Capillary refill normal to all digits.  No cyanosis or clubbing noted. Pedal hair growth normal.  Neurologic Normal speech. Oriented to person, place, and time. Epicritic sensation to light touch grossly present bilaterally.  Dermatologic Nails well groomed and normal in appearance. Skin epidermolysis noted to bilateral feet.  No open wounds or lesion noted.  Subjective  complaint of itching noted.  Orthopedic: Pain on palpation along the course of the posterior tibial tendon no pain at the insertion bilaterally.  Pain with resisted plantarflexion eversion of the foot no pain with dorsiflexion eversion of the foot.   Radiographs: None Assessment:   1. Posterior tibial tendinitis of right leg   2. Posterior tibial tendinitis of left leg   3. Tinea pedis of both feet    Plan:  Patient was evaluated and treated and all questions answered.  Bilateral posterior tibial tendinitis - All questions and concerns were discussed with the patient extensively given the amount of pain that is having a benefit from steroid injection of decreasing function, surgical pain.  Patient agrees upon to proceed with steroid injection.  I discussed the risk of rupture he states understand like to proceed despite the risks -A steroid injection was performed at Bilateral posterior tibial tendinitis using 1% plain Lidocaine  and 10 mg of Kenalog. This was well tolerated.  Bilateral athlete's foot. - All questions or concerns were discussed with the patient in extensive detail given the amount of pain that she is having she would benefit from Lotrisone  cream.  I did send Lotrisone  cream to the pharmacy of asked her to apply twice a day.    [1]  Current Outpatient Medications:    albuterol  (VENTOLIN  HFA) 108 (90 Base) MCG/ACT inhaler, Inhale 1-2 puffs into the lungs every 6 (six) hours as  needed for wheezing or shortness of breath., Disp: 8 g, Rfl: 0   apixaban  (ELIQUIS ) 2.5 MG TABS tablet, Take 1 tablet (2.5 mg total) by mouth 2 (two) times daily., Disp: 60 tablet, Rfl: 10   clopidogrel  (PLAVIX ) 75 MG tablet, Take 1 tablet (75 mg total) by mouth daily., Disp: 30 tablet, Rfl: 0   clotrimazole -betamethasone  (LOTRISONE ) cream, Apply 1 Application topically daily., Disp: 30 g, Rfl: 0   diphenhydrAMINE  (BENADRYL ) 50 MG tablet, Take 1 tablet (50 mg total) by mouth every 8 (eight) hours as  needed for itching. Take 1 tablet the bedtime the night before chemo and the night of chemo., Disp: 60 tablet, Rfl: 0   famotidine  (PEPCID ) 20 MG tablet, Take 1 tablet (20 mg total) by mouth daily., Disp: 30 tablet, Rfl: 0   FLUoxetine  (PROZAC ) 20 MG capsule, Take 1 capsule (20 mg total) by mouth daily., Disp: 30 capsule, Rfl: 3   Fluticasone -Umeclidin-Vilant (TRELEGY ELLIPTA ) 100-62.5-25 MCG/ACT AEPB, Inhale 1 puff into the lungs daily., Disp: 28 each, Rfl: 1   guaiFENesin  (MUCINEX ) 600 MG 12 hr tablet, Take 1 tablet (600 mg total) by mouth 2 (two) times daily., Disp: 30 tablet, Rfl: 1   hydrOXYzine  (ATARAX ) 10 MG tablet, Take 10 mg by mouth 3 (three) times daily., Disp: , Rfl:    INGREZZA 40 MG capsule, Take 40 mg by mouth daily., Disp: , Rfl:    ipratropium-albuterol  (DUONEB) 0.5-2.5 (3) MG/3ML SOLN, Take 3 mLs by nebulization 3 (three) times daily., Disp: 360 mL, Rfl: 1   mirtazapine  (REMERON ) 7.5 MG tablet, Take 1 tablet (7.5 mg total) by mouth at bedtime., Disp: 30 tablet, Rfl: 5   montelukast  (SINGULAIR ) 10 MG tablet, Take 1 tablet (10 mg total) by mouth at bedtime., Disp: 30 tablet, Rfl: 1   potassium chloride  SA (KLOR-CON  M) 20 MEQ tablet, Take 1 tablet (20 mEq total) by mouth daily., Disp: 7 tablet, Rfl: 0   pravastatin  (PRAVACHOL ) 20 MG tablet, Take 1 tablet (20 mg total) by mouth daily at 6 PM., Disp: 30 tablet, Rfl: 0   predniSONE  (STERAPRED UNI-PAK 21 TAB) 10 MG (21) TBPK tablet, Day 1 take 6 tablets, Day 2 take 5 tablets, Day 3 take 4 tablets, Day 4 take 3 tablets, Day 5 take 2 tablets D6 take 1 tablet, Disp: 1 each, Rfl: 0   QUEtiapine  (SEROQUEL ) 300 MG tablet, Take 2 tablets (600 mg total) by mouth at bedtime., Disp: 60 tablet, Rfl: 0   senna-docusate (SENOKOT-S) 8.6-50 MG tablet, Take 1 tablet by mouth daily after breakfast., Disp: 30 tablet, Rfl: 0   traZODone  (DESYREL ) 100 MG tablet, Take 2 tablets (200 mg total) by mouth at bedtime as needed for sleep., Disp: 30 tablet, Rfl:  0 [2]  Social History Tobacco Use  Smoking Status Former   Current packs/day: 0.15   Average packs/day: 0.2 packs/day for 45.0 years (6.8 ttl pk-yrs)   Types: Cigarettes  Smokeless Tobacco Never  [3] No Known Allergies

## 2024-07-19 ENCOUNTER — Encounter: Payer: Self-pay | Admitting: Podiatry

## 2024-07-25 ENCOUNTER — Encounter: Payer: Self-pay | Admitting: Oncology

## 2024-07-25 ENCOUNTER — Telehealth: Payer: Self-pay | Admitting: Oncology

## 2024-07-25 ENCOUNTER — Inpatient Hospital Stay

## 2024-07-25 ENCOUNTER — Inpatient Hospital Stay: Admitting: Oncology

## 2024-07-25 VITALS — BP 143/90 | HR 95 | Temp 97.0°F | Resp 18 | Wt 208.3 lb

## 2024-07-25 VITALS — BP 140/89 | HR 88

## 2024-07-25 DIAGNOSIS — C3492 Malignant neoplasm of unspecified part of left bronchus or lung: Secondary | ICD-10-CM | POA: Diagnosis not present

## 2024-07-25 DIAGNOSIS — C3412 Malignant neoplasm of upper lobe, left bronchus or lung: Secondary | ICD-10-CM | POA: Diagnosis not present

## 2024-07-25 DIAGNOSIS — E876 Hypokalemia: Secondary | ICD-10-CM | POA: Diagnosis not present

## 2024-07-25 DIAGNOSIS — J449 Chronic obstructive pulmonary disease, unspecified: Secondary | ICD-10-CM | POA: Diagnosis not present

## 2024-07-25 DIAGNOSIS — R21 Rash and other nonspecific skin eruption: Secondary | ICD-10-CM | POA: Diagnosis not present

## 2024-07-25 DIAGNOSIS — I2699 Other pulmonary embolism without acute cor pulmonale: Secondary | ICD-10-CM | POA: Diagnosis not present

## 2024-07-25 LAB — CMP (CANCER CENTER ONLY)
ALT: 20 U/L (ref 0–44)
AST: 23 U/L (ref 15–41)
Albumin: 4.2 g/dL (ref 3.5–5.0)
Alkaline Phosphatase: 73 U/L (ref 38–126)
Anion gap: 11 (ref 5–15)
BUN: 12 mg/dL (ref 8–23)
CO2: 24 mmol/L (ref 22–32)
Calcium: 9.4 mg/dL (ref 8.9–10.3)
Chloride: 102 mmol/L (ref 98–111)
Creatinine: 0.89 mg/dL (ref 0.61–1.24)
GFR, Estimated: >60 mL/min (ref >60)
Glucose, Bld: 93 mg/dL (ref 70–99)
Potassium: 4.4 mmol/L (ref 3.5–5.1)
Sodium: 136 mmol/L (ref 135–145)
Total Bilirubin: 0.3 mg/dL (ref 0.0–1.2)
Total Protein: 7.5 g/dL (ref 6.5–8.1)

## 2024-07-25 LAB — CBC WITH DIFFERENTIAL (CANCER CENTER ONLY)
Abs Immature Granulocytes: 0.03 K/uL (ref 0.00–0.07)
Basophils Absolute: 0.1 K/uL (ref 0.0–0.1)
Basophils Relative: 1 %
Eosinophils Absolute: 0.1 K/uL (ref 0.0–0.5)
Eosinophils Relative: 1 %
HCT: 38.5 % — ABNORMAL LOW (ref 39.0–52.0)
Hemoglobin: 12.5 g/dL — ABNORMAL LOW (ref 13.0–17.0)
Immature Granulocytes: 0 %
Lymphocytes Relative: 14 %
Lymphs Abs: 1.1 K/uL (ref 0.7–4.0)
MCH: 28.7 pg (ref 26.0–34.0)
MCHC: 32.5 g/dL (ref 30.0–36.0)
MCV: 88.3 fL (ref 80.0–100.0)
Monocytes Absolute: 0.9 K/uL (ref 0.1–1.0)
Monocytes Relative: 12 %
Neutro Abs: 5.8 K/uL (ref 1.7–7.7)
Neutrophils Relative %: 72 %
Platelet Count: 223 K/uL (ref 150–400)
RBC: 4.36 MIL/uL (ref 4.22–5.81)
RDW: 14.5 % (ref 11.5–15.5)
WBC Count: 7.9 K/uL (ref 4.0–10.5)
nRBC: 0 % (ref 0.0–0.2)

## 2024-07-25 MED ORDER — POTASSIUM CHLORIDE CRYS ER 20 MEQ PO TBCR: 20.0000 meq | EXTENDED_RELEASE_TABLET | Freq: Every day | ORAL | 0 refills | Status: AC

## 2024-07-25 MED ORDER — SODIUM CHLORIDE 0.9 % IV SOLN
INTRAVENOUS | Status: DC
  Filled 2024-07-25: qty 250

## 2024-07-25 MED ORDER — SODIUM CHLORIDE 0.9 % IV SOLN
10.0000 mg/kg | Freq: Once | INTRAVENOUS | Status: AC
  Administered 2024-07-25: 11:00:00 1000 mg via INTRAVENOUS
  Filled 2024-07-25: qty 20

## 2024-07-25 NOTE — Progress Notes (Signed)
 Hematology/Oncology Progress note Telephone:(336) [phone] Fax:(336) [phone]      CHIEF COMPLAINTS/PURPOSE OF CONSULTATION:  Stage III Lung cancer  ASSESSMENT & PLAN:   Cancer Staging  Adenocarcinoma of lung [facility]) Staging form: Lung, AJCC 8th Edition - Clinical stage from 03/22/2023: cT1, cN3, cM0 - Signed by Babara Call, MD on 03/22/2023   Adenocarcinoma of lung (HCC) Clinically he has Stage III left lung cancer He has right paratracheal lymph node hypermetabolic activity, although Station 7 lymph node showed atypical cell, no definite malignancy.  Left upper lobe adenocarcinoma, No enough tissue for NGS, liquid biopsy showed actionable variants.   S/p concurrent chemotherapy radiation. -carboplatin  AUC 2 and taxol  45mg /m2  On Durvalumab  maintenance which was held since July due to further workup after July 2025 CT which showed increased right middle lobe lesion.  And patient got radiation to the right middle lobe lesion suspicious for synchronous neoplasm.  Clinically he is doing well today Labs are reviewed and discussed with patient. Proceed with Durvalumab  maintenance   COPD (chronic obstructive pulmonary disease) (HCC) Continue albuterol  inhaler PRN, Duonebs.  follow up with pulmonology Dr. Theotis.  Continue Singulair , mucinex .   Skin rash Due to recent antibiotics, now resolved. He is on clotrimazole /betamethasone  treatment for tinea pedis on his hands.  He has not been on Durvalumab  since July 2025 so this is not due to immunotherapy    Pulmonary embolism (HCC) Continue Elqiuis to 2.5mg  BID. He is on Plavix  75mg  daily.    Hypokalemia Recommend  potassium chloride  20meq daily x 3 days    Orders Placed This Encounter  Procedures   CBC with Differential (Cancer Center Only)    Standing Status:   Future    Expected Date:   08/15/2024    Expiration Date:   08/15/2025   CMP (Cancer Center only)    Standing Status:   Future    Expected Date:   08/15/2024    Expiration  Date:   08/15/2025   T4    Standing Status:   Future    Expected Date:   08/15/2024    Expiration Date:   08/15/2025   TSH    Standing Status:   Future    Expected Date:   08/15/2024    Expiration Date:   08/15/2025   CBC with Differential (Cancer Center Only)    Standing Status:   Future    Expected Date:   08/29/2024    Expiration Date:   08/29/2025   CMP (Cancer Center only)    Standing Status:   Future    Expected Date:   08/29/2024    Expiration Date:   08/29/2025   T4    Standing Status:   Future    Expected Date:   08/29/2024    Expiration Date:   08/29/2025   TSH    Standing Status:   Future    Expected Date:   08/29/2024    Expiration Date:   08/29/2025   Follow up 3 weeks All questions were answered. The patient knows to call the clinic with any problems, questions or concerns.  Call Babara, MD, PhD [facility] Hematology Oncology 07/25/2024    HISTORY OF PRESENTING ILLNESS:  [name] 63 y.o. male presents for follow up of lung cancer.  I have reviewed his chart and materials related to his cancer extensively and collaborated history with the patient. Summary of oncologic history is as follows:  12/08/2021 CT chest with contrast showed irregular solid pulmonary nodule of right  upper lobe, increased in size.  Bibasilar consolidation.  Mildly enlarged right lower paratracheal lymph node.  Atherosclerosis. 01/04/2022 PET scan showed 10 mm irregular nodule in the lateral right upper lobe, suspicious for primary bronchogenic neoplasm. 2 small right paratracheal nodes, indeterminate and favored to be reactive.  Attention on follow-up.  Patient was seen by radiation oncology Dr. Lenn.  Patient was offered SBRT to the right upper lobe nodule/presumed non-small cell lung cancer stage I.  Patient lives in a group home.  He reports chronic cough.  Some shortness of breath with exertion. He is a current everyday smoker.  Few cigarettes per day.  He denies any unintentional weight loss,  night sweats, fever or chills. Patient is on aspirin  and Plavix    INTERVAL HISTORY [name] is a 63 y.o. male who has above history reviewed by me today presents for follow up visit for lung cancer.  Oncology History  Adenocarcinoma of lung (HCC)  12/29/2022 Imaging   CT chest with contrast showed Stable radiation changes involving the right upper lobe with a small residual treated nodule.  Decrease in size.  Interval enlargement of the left upper lobe pulmonary nodule.  Stable mediastinal nodes stable advanced emphysematous changes and areas of pulmonary scarring.  Stable bilateral adrenal gland adenomas.  Emphysema   01/30/2023 Imaging   PET 1. Recurrent bronchogenic carcinoma in the left upper lobe with new and increasingly hypermetabolic mediastinal lymph nodes. No evidence of distant metastatic disease. 2. Possible mild prostate hypermetabolism. Consider laboratory correlation. 3. Liver is mildly heterogeneous, raising suspicion for steatosis. 4. Cholelithiasis. 5. Right adrenal adenoma.  Left adrenal myelolipoma. 6. Aortic atherosclerosis (ICD10-I70.0). Coronary artery calcification. 7. Enlarged pulmonic trunk, indicative of pulmonary arterial hypertension.   03/15/2023 Initial Diagnosis   Adenocarcinoma of left lung   Bronchoscopy showed  Lung, biopsy, Left upper lobe - NON-SMALL CELL CARCINOMA, FAVOR ADENOCARCINOMA.  1. Bronchial Lavage, Left upper lobe - POSITIVE FOR MALIGNANCY. - NON-SMALL CELL CARCINOMA, FAVOR ADENOCARCINOMA. - SEE NOTE. 2. Bronchial Brushing, Left upper lobe - POSITIVE FOR MALIGNANCY. - NON-SMALL CELL CARCINOMA, FAVOR ADENOCARCINOMA. 3. Bronchus, biopsy, left upper lobe - SEE [id number] 4. Fine Needle Aspiration, left upper lobe - POSITIVE FOR MALIGNANCY. - NON-SMALL CELL CARCINOMA, FAVOR ADENOCARCINOMA. 5. Fine Needle Aspiration, station 7 lymph node - ATYPICAL. - BACKGROUND LYMPHOID TISSUE IS PRESENT CONSISTENT WITH LYMPH NODE  SAMPLING.    03/15/2023 Procedure   Fiberoptic bronchoscopy with airway inspection and BAL Procedure findings:   Bronchoscope was inserted via ETT  without difficulty.  Posterior oropharynx, epiglottis, arytenoids, false cords and vocal cords were not visualized as these were bypassed by endotracheal tube. The distal trachea was normal in circumference and appearance without mucosal, cartilaginous or branching abnormalities.  The main carina was mildly splayed . All right and left lobar airways were visualized to the Subsegmental level.  Sub- sub segmental carinae were identified in all the distal airways.   Secretions were visible in the following airways and appeared to be clear.  The mucosa was : friable at LEFT UPPER LOBE   Airways were notable for:        exophytic lesions :n       extrinsic compression in the following distributions: n.       Friable mucosa: y       Teacher, music /pigmentation: n   MUCUS PLUGGING WAS SEVERE ON LEFT SIDE AND COMPLETE OBSTRUCTED MULTIPLE AIRWAYS.  UNABLE TO NAVIGATE THROUGH TRACHEOBRONCHIAL TREE DUE TO SEVERE MUCUS PLUGGING. THERAPEUTIC ASPIRATION WAS  PERFORMED X 7 AND WAS DONE BILATERALLY ALTHOUGH WORSE ON LEFT.  BAL WAS PERFORMED AT LEFT UPPER LOBE X 2.     Endobronchial ultrasound assisted hilar and mediastinal lymph node biopsies procedure findings: The fiberoptic bronchoscope was removed and the EBUS scope was introduced. Examination began to evaluate for pathologically enlarged lymph nodes starting on the RIGHT  side progressing to the LEFT side.  All lymph node biopsies performed with 21G  needle. Lymph node biopsies were sent in cytolite for all stations.   STATION 10R - 6mm not biopsied STATION 7 - 1.2cm biopsied 3 times STATION 10L - 5mm not biopsied  STATION 4L - 6mm not biopsied    03/22/2023 Cancer Staging   Staging form: Lung, AJCC 8th Edition - Clinical stage from 03/22/2023: cT1, cN3, cM0 - Signed by Babara Call, MD on  03/22/2023 Stage prefix: Initial diagnosis   04/20/2023 - 06/08/2023 Chemotherapy   Patient is on Treatment Plan : LUNG Carboplatin  + Paclitaxel  + XRT q7d     09/01/2023 -  Chemotherapy   Patient is on Treatment Plan : LUNG Durvalumab  (10) q14d     10/15/2023 - 10/24/2023 Hospital Admission   Hospitalized due to Influenza A, COPD exacerbation, acute respiratory failure.    11/03/2023 Imaging   CT chest abdomen pelvis w contrast showed CHEST:   1. Stable LEFT upper lobe peribronchial lesion. 2. No evidence of metastatic adenopathy. 3. Stable small RIGHT paratracheal node. 4. Slight improvement in atelectasis in the RIGHT middle lobe.   PELVIS:   1. No evidence of metastatic disease in the abdomen pelvis. 2. Stable small hypodensity in the spleen and liver. 3. Stable benign RIGHT adrenal adenoma and and LEFT adrenal angiomyolipoma.    02/27/2024 Imaging   CT chest w contrast   1. Unchanged spiculated nodule of the central left upper lobe measuring 1.9 x 1.0 cm, consistent with treated primary lung malignancy. 2. Interval enlargement in a lobulated nodule of the lateral segment right middle lobe encasing a small bronchial, measuring 1.2 x 0.8 cm, previously 0.8 x 0.6 cm. This is highly suspicious for synchronous malignancy or metastasis. Given size this is likely amenable to PET-CT characterization. 3. Unchanged bandlike ground-glass in the peripheral right upper lobe. 4. Unchanged right paratracheal lymph node measuring 1.1 x 1.1 cm. No other enlarged mediastinal, hilar, or axillary lymph nodes. 5. Emphysema and diffuse bilateral bronchial wall thickening. 6. Enlargement of the main pulmonary artery, as can be seen in pulmonary hypertension. 7. Coronary artery disease.   03/18/2024 Imaging   PET scan showed  1. Hypermetabolic 10 mm lateral segment right middle lobe nodule is worrisome for bronchogenic carcinoma. 2. Spiculated left upper lobe nodule and low right  paratracheal lymph node have decreased in metabolism in the interval, compatible with treatment response. 3. Focal nodular hypermetabolism in the prostate. Malignancy cannot be excluded. 4. Right adrenal adenoma.  Left adrenal myelolipoma. 5. Aortic atherosclerosis (ICD10-I70.0). Coronary artery calcification. 6. Enlarged pulmonic trunk, indicative of pulmonary arterial hypertension. 7.  Emphysema (ICD10-J43.9).   05/01/2024 - 05/08/2024 Radiation Therapy   Patient completed SBRT to right middle lobe lesion     Discussed the use of AI scribe software for clinical note transcription with the patient, who gave verbal consent to proceed.   Chronic cough and SOB, he feels breathing status is at baseline today he is on Eliquis  2.5mg  BID and Plavix . No bleeding events.   Skin cellulitis on his lower extremity is better , He is currently being treated for  tinea pedis on his hands.  Skin rash has improved.    MEDICAL HISTORY:  Past Medical History:  Diagnosis Date   Acute on chronic respiratory failure with hypoxia (HCC)    Asthma    Coagulation disorder    COPD (chronic obstructive pulmonary disease) (HCC)    Depression    History of hiatal hernia    Hypertension    Hypokalemia    Leukocytosis    Lung mass    Normocytic anemia 07/13/2023   Pneumonia    Schizophrenia (HCC)    Shortness of breath dyspnea    Stroke [facility])    Umbilical hernia    Ventral hernia     SURGICAL HISTORY: Past Surgical History:  Procedure Laterality Date   COLONOSCOPY WITH PROPOFOL  N/A 09/21/2021   Procedure: COLONOSCOPY WITH PROPOFOL ;  Surgeon: Maryruth Ole DASEN, MD;  Location: [facility] ENDOSCOPY;  Service: Endoscopy;  Laterality: N/A;   HERNIA REPAIR     INSERTION OF MESH N/A 12/18/2014   Procedure: INSERTION OF MESH;  Surgeon: Charlie FORBES Fell, MD;  Location: [facility] ORS;  Service: General;  Laterality: N/A;   SUPRA-UMBILICAL HERNIA  12/18/2014   Procedure: SUPRA-UMBILICAL HERNIA;  Surgeon: Charlie FORBES Fell, MD;  Location: [facility] ORS;  Service: General;;   UMBILICAL HERNIA REPAIR N/A 12/18/2014   Procedure: HERNIA REPAIR UMBILICAL ADULT;  Surgeon: Charlie FORBES Fell, MD;  Location: [facility] ORS;  Service: General;  Laterality: N/A;   VIDEO BRONCHOSCOPY WITH ENDOBRONCHIAL ULTRASOUND N/A 03/15/2023   Procedure: VIDEO BRONCHOSCOPY WITH ENDOBRONCHIAL ULTRASOUND;  Surgeon: Parris Manna, MD;  Location: [facility] ORS;  Service: Thoracic;  Laterality: N/A;    SOCIAL HISTORY: Social History   Socioeconomic History   Marital status: Widowed    Spouse name: Not on file   Number of children: Not on file   Years of education: Not on file   Highest education level: Not on file  Occupational History   Not on file  Tobacco Use   Smoking status: Former    Current packs/day: 0.15    Average packs/day: 0.2 packs/day for 45.0 years (6.8 ttl pk-yrs)    Types: Cigarettes   Smokeless tobacco: Never  Vaping Use   Vaping status: Never Used  Substance and Sexual Activity   Alcohol use: Not Currently    Alcohol/week: 75.0 standard drinks of alcohol    Types: 75 Cans of beer per week   Drug use: Not Currently    Types: Marijuana, Crack cocaine    Comment: none in 15 yrs   Sexual activity: Not Currently  Other Topics Concern   Not on file  Social History Narrative   Not on file   Social Drivers of Health   Tobacco Use: Medium Risk (07/25/2024)   Patient History    Smoking Tobacco Use: Former    Smokeless Tobacco Use: Never    Passive Exposure: Not on file  Financial Resource Strain: Medium Risk (08/18/2023)   Received from [facility]   Overall Financial Resource Strain (CARDIA)    Difficulty of Paying Living Expenses: Somewhat hard  Food Insecurity: No Food Insecurity (10/17/2023)   Hunger Vital Sign    Worried About Running Out of Food in the Last Year: Never true    Ran Out of Food in the Last Year: Never true  Transportation Needs: No Transportation Needs (10/17/2023)   PRAPARE  - Administrator, Civil Service (Medical): No    Lack of Transportation (Non-Medical): No  Physical Activity: Not on  file  Stress: Not on file  Social Connections: Not on file  Intimate Partner Violence: Not At Risk (10/17/2023)   Humiliation, Afraid, Rape, and Kick questionnaire    Fear of Current or Ex-Partner: No    Emotionally Abused: No    Physically Abused: No    Sexually Abused: No  Depression (PHQ2-9): Low Risk (02/22/2024)   Depression (PHQ2-9)    PHQ-2 Score: 0  Alcohol Screen: Low Risk (06/16/2023)   Alcohol Screen    Last Alcohol Screening Score (AUDIT): 0  Housing: Low Risk (10/17/2023)   Housing Stability Vital Sign    Unable to Pay for Housing in the Last Year: No    Number of Times Moved in the Last Year: 0    Homeless in the Last Year: No  Utilities: Not At Risk (10/17/2023)   AHC Utilities    Threatened with loss of utilities: No  Health Literacy: Not on file    FAMILY HISTORY: Family History  Problem Relation Age of Onset   Asthma Mother    Hypertension Father     ALLERGIES:  has no known allergies.  MEDICATIONS:  Current Outpatient Medications  Medication Sig Dispense Refill   albuterol  (VENTOLIN  HFA) 108 (90 Base) MCG/ACT inhaler Inhale 1-2 puffs into the lungs every 6 (six) hours as needed for wheezing or shortness of breath. 8 g 0   apixaban  (ELIQUIS ) 2.5 MG TABS tablet Take 1 tablet (2.5 mg total) by mouth 2 (two) times daily. 60 tablet 10   clopidogrel  (PLAVIX ) 75 MG tablet Take 1 tablet (75 mg total) by mouth daily. 30 tablet 0   clotrimazole -betamethasone  (LOTRISONE ) cream Apply 1 Application topically daily. 30 g 0   diphenhydrAMINE  (BENADRYL ) 50 MG tablet Take 1 tablet (50 mg total) by mouth every 8 (eight) hours as needed for itching. Take 1 tablet the bedtime the night before chemo and the night of chemo. 60 tablet 0   famotidine  (PEPCID ) 20 MG tablet Take 1 tablet (20 mg total) by mouth daily. 30 tablet 0   FLUoxetine  (PROZAC ) 20 MG  capsule Take 1 capsule (20 mg total) by mouth daily. 30 capsule 3   Fluticasone -Umeclidin-Vilant (TRELEGY ELLIPTA ) 100-62.5-25 MCG/ACT AEPB Inhale 1 puff into the lungs daily. 28 each 1   guaiFENesin  (MUCINEX ) 600 MG 12 hr tablet Take 1 tablet (600 mg total) by mouth 2 (two) times daily. 30 tablet 1   hydrOXYzine  (ATARAX ) 10 MG tablet Take 10 mg by mouth 3 (three) times daily.     INGREZZA 40 MG capsule Take 40 mg by mouth daily.     ipratropium-albuterol  (DUONEB) 0.5-2.5 (3) MG/3ML SOLN Take 3 mLs by nebulization 3 (three) times daily. 360 mL 1   mirtazapine  (REMERON ) 7.5 MG tablet Take 1 tablet (7.5 mg total) by mouth at bedtime. 30 tablet 5   montelukast  (SINGULAIR ) 10 MG tablet Take 1 tablet (10 mg total) by mouth at bedtime. 30 tablet 1   pravastatin  (PRAVACHOL ) 20 MG tablet Take 1 tablet (20 mg total) by mouth daily at 6 PM. 30 tablet 0   predniSONE  (STERAPRED UNI-PAK 21 TAB) 10 MG (21) TBPK tablet Day 1 take 6 tablets, Day 2 take 5 tablets, Day 3 take 4 tablets, Day 4 take 3 tablets, Day 5 take 2 tablets D6 take 1 tablet 1 each 0   QUEtiapine  (SEROQUEL ) 300 MG tablet Take 2 tablets (600 mg total) by mouth at bedtime. 60 tablet 0   senna-docusate (SENOKOT-S) 8.6-50 MG tablet Take 1 tablet by mouth  daily after breakfast. 30 tablet 0   traZODone  (DESYREL ) 100 MG tablet Take 2 tablets (200 mg total) by mouth at bedtime as needed for sleep. 30 tablet 0   potassium chloride  SA (KLOR-CON  M) 20 MEQ tablet Take 1 tablet (20 mEq total) by mouth daily. 3 tablet 0   No current facility-administered medications for this visit.    Review of Systems  Constitutional:  Negative for appetite change, chills, fatigue, fever and unexpected weight change.  HENT:   Negative for hearing loss and voice change.   Eyes:  Negative for eye problems and icterus.  Respiratory:  Negative for chest tightness, cough and hemoptysis.   Cardiovascular:  Negative for chest pain and leg swelling.  Gastrointestinal:   Negative for abdominal distention and abdominal pain.  Endocrine: Negative for hot flashes.  Genitourinary:  Negative for difficulty urinating, dysuria and frequency.   Musculoskeletal:  Negative for arthralgias.  Skin:  Negative for itching and rash.  Neurological:  Negative for light-headedness and numbness.  Hematological:  Negative for adenopathy. Does not bruise/bleed easily.  Psychiatric/Behavioral:  Negative for confusion.      PHYSICAL EXAMINATION: ECOG PERFORMANCE STATUS: 1 - Symptomatic but completely ambulatory  Vitals:   07/25/24 0929 07/25/24 0938  BP: (!) 136/91 (!) 143/90  Pulse: 95   Resp: 18   Temp: (!) 97 F (36.1 C)   SpO2: 95% 97%   Filed Weights   07/25/24 0929  Weight: 208 lb 4.8 oz (94.5 kg)    Physical Exam Constitutional:      General: He is not in acute distress.    Appearance: He is not diaphoretic.  HENT:     Head: Normocephalic and atraumatic.  Eyes:     General: No scleral icterus. Cardiovascular:     Rate and Rhythm: Normal rate and regular rhythm.  Pulmonary:     Effort: Pulmonary effort is normal. No respiratory distress.     Breath sounds: No wheezing.     Comments: Decreased breath sound bilaterally, poor air entry Abdominal:     General: There is no distension.     Palpations: Abdomen is soft.     Tenderness: There is no abdominal tenderness.  Musculoskeletal:        General: Normal range of motion.     Cervical back: Normal range of motion and neck supple.  Skin:    General: Skin is warm and dry.     Findings: Rash present. No erythema.  Neurological:     Mental Status: He is alert and oriented to person, place, and time. Mental status is at baseline.     Motor: No abnormal muscle tone.  Psychiatric:        Mood and Affect: Affect normal.     Comments: Flat      LABORATORY DATA:  I have reviewed the data as listed    Latest Ref Rng & Units 07/25/2024    9:23 AM 07/11/2024   12:57 PM 06/27/2024    9:20 AM  CBC   WBC 4.0 - 10.5 K/uL 7.9  7.0  6.2   Hemoglobin 13.0 - 17.0 g/dL 87.4  88.1  88.0   Hematocrit 39.0 - 52.0 % 38.5  35.8  35.5   Platelets 150 - 400 K/uL 223  248  194       Latest Ref Rng & Units 07/25/2024    9:23 AM 07/11/2024   12:57 PM 06/27/2024    9:20 AM  CMP  Glucose 70 - 99  mg/dL 93  878  896   BUN 8 - 23 mg/dL 12  <5  6   Creatinine 0.61 - 1.24 mg/dL 9.10  9.32  9.38   Sodium 135 - 145 mmol/L 136  139  134   Potassium 3.5 - 5.1 mmol/L 4.4  3.3  3.6   Chloride 98 - 111 mmol/L 102  100  103   CO2 22 - 32 mmol/L 24  28  23    Calcium 8.9 - 10.3 mg/dL 9.4  9.4  9.0   Total Protein 6.5 - 8.1 g/dL 7.5  7.2  7.2   Total Bilirubin 0.0 - 1.2 mg/dL 0.3  0.4  0.8   Alkaline Phos 38 - 126 U/L 73  74  74   AST 15 - 41 U/L 23  16  15    ALT 0 - 44 U/L 20  6  11       RADIOGRAPHIC STUDIES: I have personally reviewed the radiological images as listed and agreed with the findings in the report. No results found.

## 2024-07-25 NOTE — Patient Instructions (Signed)
 [facility] - A DEPT OF [facility]  Discharge Instructions: Thank you for choosing [facility] to provide your oncology and hematology care.  If you have a lab appointment with the Cancer Center, please go directly to the Cancer Center and check in at the registration area.  Wear comfortable clothing and clothing appropriate for easy access to any Portacath or PICC line.   We strive to give you quality time with your provider. You may need to reschedule your appointment if you arrive late (15 or more minutes).  Arriving late affects you and other patients whose appointments are after yours.  Also, if you miss three or more appointments without notifying the office, you may be dismissed from the clinic at the provider's discretion.      For prescription refill requests, have your pharmacy contact our office and allow 72 hours for refills to be completed.    Today you received the following chemotherapy and/or immunotherapy agents Imfinzi      To help prevent nausea and vomiting after your treatment, we encourage you to take your nausea medication as directed.  BELOW ARE SYMPTOMS THAT SHOULD BE REPORTED IMMEDIATELY: *FEVER GREATER THAN 100.4 F (38 C) OR HIGHER *CHILLS OR SWEATING *NAUSEA AND VOMITING THAT IS NOT CONTROLLED WITH YOUR NAUSEA MEDICATION *UNUSUAL SHORTNESS OF BREATH *UNUSUAL BRUISING OR BLEEDING *URINARY PROBLEMS (pain or burning when urinating, or frequent urination) *BOWEL PROBLEMS (unusual diarrhea, constipation, pain near the anus) TENDERNESS IN MOUTH AND THROAT WITH OR WITHOUT PRESENCE OF ULCERS (sore throat, sores in mouth, or a toothache) UNUSUAL RASH, SWELLING OR PAIN  UNUSUAL VAGINAL DISCHARGE OR ITCHING   Items with * indicate a potential emergency and should be followed up as soon as possible or go to the Emergency Department if any problems should occur.  Please show the CHEMOTHERAPY ALERT CARD or IMMUNOTHERAPY  ALERT CARD at check-in to the Emergency Department and triage nurse.  Should you have questions after your visit or need to cancel or reschedule your appointment, please contact [facility] - A DEPT OF [facility]  [phone] and follow the prompts.  Office hours are 8:00 a.m. to 4:30 p.m. Monday - Friday. Please note that voicemails left after 4:00 p.m. may not be returned until the following business day.  We are closed weekends and major holidays. You have access to a nurse at all times for urgent questions. Please call the main number to the clinic [phone] and follow the prompts.  For any non-urgent questions, you may also contact your provider using MyChart. We now offer e-Visits for anyone 1 and older to request care online for non-urgent symptoms. For details visit [email or web address].   Also download the MyChart app! Go to the app store, search "MyChart", open the app, select [place], and log in with your MyChart username and password.

## 2024-07-25 NOTE — Telephone Encounter (Signed)
 Pt called to confirm appt times for today - I let him know that he has already missed his lab and MD follow up that he was supposed to have this morning - he said he was still coming and would be here shortly. Lab was at 8:30, but he called around 9 - LH

## 2024-07-25 NOTE — Assessment & Plan Note (Signed)
 Recommend  potassium chloride  20meq daily x 3 days

## 2024-07-25 NOTE — Assessment & Plan Note (Signed)
 Continue Elqiuis to 2.5mg  BID. He is on Plavix  75mg  daily.

## 2024-07-25 NOTE — Assessment & Plan Note (Signed)
 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  Left upper lobe adenocarcinoma, No enough tissue for NGS, liquid biopsy showed actionable variants.   S/p concurrent chemotherapy radiation. -carboplatin  AUC 2 and taxol  45mg /m2  On Durvalumab  maintenance which was held since July due to further workup after July 2025 CT which showed increased right middle lobe lesion.  And patient got radiation to the right middle lobe lesion suspicious for synchronous neoplasm.  Clinically he is doing well today Labs are reviewed and discussed with patient. Proceed with Durvalumab  maintenance

## 2024-07-25 NOTE — Assessment & Plan Note (Addendum)
 Due to recent antibiotics, now resolved. He is on clotrimazole /betamethasone  treatment for tinea pedis on his hands.  He has not been on Durvalumab  since July 2025 so this is not due to immunotherapy

## 2024-07-25 NOTE — Assessment & Plan Note (Signed)
 Continue albuterol inhaler PRN, Duonebs.  follow up with pulmonology Dr. Meredeth Ide.  Continue Singulair, mucinex.

## 2024-08-12 ENCOUNTER — Telehealth: Payer: Self-pay | Admitting: *Deleted

## 2024-08-12 NOTE — Telephone Encounter (Signed)
 [facility] [phone]-called and left a vm on triage. Patient was given rx for potassium on 12/18. The script was only written for #3 tablets. She needs a verbal order to either refill this or discontinue it all the patient's med list. Please return their phone call.

## 2024-08-12 NOTE — Telephone Encounter (Signed)
 Call returned to [facility] and spoke to [place]. She states that pt has and acitve rx for potassium 10mEq daily from Dr. Lorel. Informed her that Dr. Babara only sent oral K 20mEq daily for 3 days. No additional fills needed from Dr. Babara.

## 2024-08-15 ENCOUNTER — Inpatient Hospital Stay: Attending: Oncology

## 2024-08-15 ENCOUNTER — Telehealth: Payer: Self-pay | Admitting: *Deleted

## 2024-08-15 ENCOUNTER — Encounter: Payer: Self-pay | Admitting: Oncology

## 2024-08-15 ENCOUNTER — Inpatient Hospital Stay (HOSPITAL_BASED_OUTPATIENT_CLINIC_OR_DEPARTMENT_OTHER): Admitting: Oncology

## 2024-08-15 ENCOUNTER — Inpatient Hospital Stay

## 2024-08-15 VITALS — BP 126/86 | HR 82 | Resp 18

## 2024-08-15 VITALS — BP 127/89 | HR 78 | Temp 97.3°F | Resp 18 | Wt 209.2 lb

## 2024-08-15 DIAGNOSIS — K429 Umbilical hernia without obstruction or gangrene: Secondary | ICD-10-CM | POA: Insufficient documentation

## 2024-08-15 DIAGNOSIS — F17210 Nicotine dependence, cigarettes, uncomplicated: Secondary | ICD-10-CM | POA: Insufficient documentation

## 2024-08-15 DIAGNOSIS — J439 Emphysema, unspecified: Secondary | ICD-10-CM | POA: Diagnosis not present

## 2024-08-15 DIAGNOSIS — C3412 Malignant neoplasm of upper lobe, left bronchus or lung: Secondary | ICD-10-CM | POA: Insufficient documentation

## 2024-08-15 DIAGNOSIS — F209 Schizophrenia, unspecified: Secondary | ICD-10-CM | POA: Insufficient documentation

## 2024-08-15 DIAGNOSIS — Z79899 Other long term (current) drug therapy: Secondary | ICD-10-CM | POA: Insufficient documentation

## 2024-08-15 DIAGNOSIS — Z5112 Encounter for antineoplastic immunotherapy: Secondary | ICD-10-CM

## 2024-08-15 DIAGNOSIS — C3492 Malignant neoplasm of unspecified part of left bronchus or lung: Secondary | ICD-10-CM

## 2024-08-15 DIAGNOSIS — I272 Pulmonary hypertension, unspecified: Secondary | ICD-10-CM | POA: Diagnosis not present

## 2024-08-15 DIAGNOSIS — I700 Atherosclerosis of aorta: Secondary | ICD-10-CM | POA: Diagnosis not present

## 2024-08-15 DIAGNOSIS — I2510 Atherosclerotic heart disease of native coronary artery without angina pectoris: Secondary | ICD-10-CM | POA: Insufficient documentation

## 2024-08-15 DIAGNOSIS — J449 Chronic obstructive pulmonary disease, unspecified: Secondary | ICD-10-CM

## 2024-08-15 DIAGNOSIS — Z8701 Personal history of pneumonia (recurrent): Secondary | ICD-10-CM | POA: Diagnosis not present

## 2024-08-15 DIAGNOSIS — I2699 Other pulmonary embolism without acute cor pulmonale: Secondary | ICD-10-CM

## 2024-08-15 DIAGNOSIS — D1779 Benign lipomatous neoplasm of other sites: Secondary | ICD-10-CM | POA: Insufficient documentation

## 2024-08-15 DIAGNOSIS — Z87891 Personal history of nicotine dependence: Secondary | ICD-10-CM | POA: Insufficient documentation

## 2024-08-15 DIAGNOSIS — Z7902 Long term (current) use of antithrombotics/antiplatelets: Secondary | ICD-10-CM | POA: Insufficient documentation

## 2024-08-15 DIAGNOSIS — Z7901 Long term (current) use of anticoagulants: Secondary | ICD-10-CM | POA: Insufficient documentation

## 2024-08-15 DIAGNOSIS — K8020 Calculus of gallbladder without cholecystitis without obstruction: Secondary | ICD-10-CM | POA: Diagnosis not present

## 2024-08-15 DIAGNOSIS — J4489 Other specified chronic obstructive pulmonary disease: Secondary | ICD-10-CM | POA: Diagnosis not present

## 2024-08-15 DIAGNOSIS — D3501 Benign neoplasm of right adrenal gland: Secondary | ICD-10-CM | POA: Insufficient documentation

## 2024-08-15 DIAGNOSIS — D3502 Benign neoplasm of left adrenal gland: Secondary | ICD-10-CM | POA: Diagnosis not present

## 2024-08-15 DIAGNOSIS — I10 Essential (primary) hypertension: Secondary | ICD-10-CM | POA: Diagnosis not present

## 2024-08-15 DIAGNOSIS — Z8673 Personal history of transient ischemic attack (TIA), and cerebral infarction without residual deficits: Secondary | ICD-10-CM | POA: Diagnosis not present

## 2024-08-15 DIAGNOSIS — K7689 Other specified diseases of liver: Secondary | ICD-10-CM | POA: Diagnosis not present

## 2024-08-15 LAB — CBC WITH DIFFERENTIAL (CANCER CENTER ONLY)
Abs Immature Granulocytes: 0.01 K/uL (ref 0.00–0.07)
Basophils Absolute: 0 K/uL (ref 0.0–0.1)
Basophils Relative: 0 %
Eosinophils Absolute: 0.1 K/uL (ref 0.0–0.5)
Eosinophils Relative: 1 %
HCT: 35.6 % — ABNORMAL LOW (ref 39.0–52.0)
Hemoglobin: 11.5 g/dL — ABNORMAL LOW (ref 13.0–17.0)
Immature Granulocytes: 0 %
Lymphocytes Relative: 24 %
Lymphs Abs: 1 K/uL (ref 0.7–4.0)
MCH: 29.5 pg (ref 26.0–34.0)
MCHC: 32.3 g/dL (ref 30.0–36.0)
MCV: 91.3 fL (ref 80.0–100.0)
Monocytes Absolute: 0.5 K/uL (ref 0.1–1.0)
Monocytes Relative: 11 %
Neutro Abs: 2.7 K/uL (ref 1.7–7.7)
Neutrophils Relative %: 64 %
Platelet Count: 196 K/uL (ref 150–400)
RBC: 3.9 MIL/uL — ABNORMAL LOW (ref 4.22–5.81)
RDW: 15.7 % — ABNORMAL HIGH (ref 11.5–15.5)
WBC Count: 4.2 K/uL (ref 4.0–10.5)
nRBC: 0 % (ref 0.0–0.2)

## 2024-08-15 LAB — CMP (CANCER CENTER ONLY)
ALT: 21 U/L (ref 0–44)
AST: 24 U/L (ref 15–41)
Albumin: 4 g/dL (ref 3.5–5.0)
Alkaline Phosphatase: 71 U/L (ref 38–126)
Anion gap: 9 (ref 5–15)
BUN: 7 mg/dL — ABNORMAL LOW (ref 8–23)
CO2: 26 mmol/L (ref 22–32)
Calcium: 9.2 mg/dL (ref 8.9–10.3)
Chloride: 105 mmol/L (ref 98–111)
Creatinine: 0.82 mg/dL (ref 0.61–1.24)
GFR, Estimated: >60 mL/min
Glucose, Bld: 103 mg/dL — ABNORMAL HIGH (ref 70–99)
Potassium: 3.9 mmol/L (ref 3.5–5.1)
Sodium: 139 mmol/L (ref 135–145)
Total Bilirubin: 0.4 mg/dL (ref 0.0–1.2)
Total Protein: 6.9 g/dL (ref 6.5–8.1)

## 2024-08-15 LAB — TSH: TSH: 1.77 u[IU]/mL (ref 0.350–4.500)

## 2024-08-15 MED ORDER — ONDANSETRON HCL 4 MG PO TABS: 4.0000 mg | ORAL_TABLET | Freq: Three times a day (TID) | ORAL | 0 refills | Status: AC | PRN

## 2024-08-15 MED ORDER — SODIUM CHLORIDE 0.9 % IV SOLN
10.0000 mg/kg | Freq: Once | INTRAVENOUS | Status: AC
  Administered 2024-08-15: 1000 mg via INTRAVENOUS
  Filled 2024-08-15: qty 20

## 2024-08-15 MED ORDER — SODIUM CHLORIDE 0.9 % IV SOLN
INTRAVENOUS | Status: DC
  Filled 2024-08-15: qty 250

## 2024-08-15 NOTE — Patient Instructions (Signed)
 [facility] - A DEPT OF [facility]  Discharge Instructions: Thank you for choosing [facility] to provide your oncology and hematology care.  If you have a lab appointment with the Cancer Center, please go directly to the Cancer Center and check in at the registration area.  Wear comfortable clothing and clothing appropriate for easy access to any Portacath or PICC line.   We strive to give you quality time with your provider. You may need to reschedule your appointment if you arrive late (15 or more minutes).  Arriving late affects you and other patients whose appointments are after yours.  Also, if you miss three or more appointments without notifying the office, you may be dismissed from the clinic at the provider's discretion.      For prescription refill requests, have your pharmacy contact our office and allow 72 hours for refills to be completed.    Today you received the following chemotherapy and/or immunotherapy agents IMFINZI      To help prevent nausea and vomiting after your treatment, we encourage you to take your nausea medication as directed.  BELOW ARE SYMPTOMS THAT SHOULD BE REPORTED IMMEDIATELY: *FEVER GREATER THAN 100.4 F (38 C) OR HIGHER *CHILLS OR SWEATING *NAUSEA AND VOMITING THAT IS NOT CONTROLLED WITH YOUR NAUSEA MEDICATION *UNUSUAL SHORTNESS OF BREATH *UNUSUAL BRUISING OR BLEEDING *URINARY PROBLEMS (pain or burning when urinating, or frequent urination) *BOWEL PROBLEMS (unusual diarrhea, constipation, pain near the anus) TENDERNESS IN MOUTH AND THROAT WITH OR WITHOUT PRESENCE OF ULCERS (sore throat, sores in mouth, or a toothache) UNUSUAL RASH, SWELLING OR PAIN  UNUSUAL VAGINAL DISCHARGE OR ITCHING   Items with * indicate a potential emergency and should be followed up as soon as possible or go to the Emergency Department if any problems should occur.  Please show the CHEMOTHERAPY ALERT CARD or IMMUNOTHERAPY  ALERT CARD at check-in to the Emergency Department and triage nurse.  Should you have questions after your visit or need to cancel or reschedule your appointment, please contact [facility] - A DEPT OF [name]  [phone] and follow the prompts.  Office hours are 8:00 a.m. to 4:30 p.m. Monday - Friday. Please note that voicemails left after 4:00 p.m. may not be returned until the following business day.  We are closed weekends and major holidays. You have access to a nurse at all times for urgent questions. Please call the main number to the clinic [phone] and follow the prompts.  For any non-urgent questions, you may also contact your provider using MyChart. We now offer e-Visits for anyone 30 and older to request care online for non-urgent symptoms. For details visit [email or web address].   Also download the MyChart app! Go to the app store, search "MyChart", open the app, select [facility], and log in with your MyChart username and password.  Durvalumab Injection What is this medication? DURVALUMAB (dur VAL ue mab) treats some types of cancer. It works by helping your immune system slow or stop the spread of cancer cells. It is a monoclonal antibody. This medicine may be used for other purposes; ask your health care provider or pharmacist if you have questions. COMMON BRAND NAME(S): IMFINZI What should I tell my care team before I take this medication? They need to know if you have any of these conditions: Allogeneic stem cell transplant (uses someone else's stem cells) Autoimmune diseases, such as Crohn disease, ulcerative  colitis, lupus History of chest radiation Nervous system problems, such as Guillain-Barre syndrome, myasthenia gravis Organ transplant An unusual or allergic reaction to durvalumab, other medications, foods, dyes, or preservatives Pregnant or trying to get pregnant Breast-feeding How should I use this medication? This  medication is infused into a vein. It is given by your care team in a hospital or clinic setting. A special MedGuide will be given to you before each treatment. Be sure to read this information carefully each time. Talk to your care team about the use of this medication in children. Special care may be needed. Overdosage: If you think you have taken too much of this medicine contact a poison control center or emergency room at once. NOTE: This medicine is only for you. Do not share this medicine with others. What if I miss a dose? Keep appointments for follow-up doses. It is important not to miss your dose. Call your care team if you are unable to keep an appointment. What may interact with this medication? Interactions have not been studied. This list may not describe all possible interactions. Give your health care provider a list of all the medicines, herbs, non-prescription drugs, or dietary supplements you use. Also tell them if you smoke, drink alcohol, or use illegal drugs. Some items may interact with your medicine. What should I watch for while using this medication? Your condition will be monitored carefully while you are receiving this medication. You may need blood work while taking this medication. This medication may cause serious skin reactions. They can happen weeks to months after starting the medication. Contact your care team right away if you notice fevers or flu-like symptoms with a rash. The rash may be red or purple and then turn into blisters or peeling of the skin. You may also notice a red rash with swelling of the face, lips, or lymph nodes in your neck or under your arms. Tell your care team right away if you have any change in your eyesight. Talk to your care team if you may be pregnant. Serious birth defects can occur if you take this medication during pregnancy and for 3 months after the last dose. You will need a negative pregnancy test before starting this medication.  Contraception is recommended while taking this medication and for 3 months after the last dose. Your care team can help you find the option that works for you. Do not breastfeed while taking this medication and for 3 months after the last dose. What side effects may I notice from receiving this medication? Side effects that you should report to your care team as soon as possible: Allergic reactions--skin rash, itching, hives, swelling of the face, lips, tongue, or throat Dry cough, shortness of breath or trouble breathing Eye pain, redness, irritation, or discharge with blurry or decreased vision Heart muscle inflammation--unusual weakness or fatigue, shortness of breath, chest pain, fast or irregular heartbeat, dizziness, swelling of the ankles, feet, or hands Hormone gland problems--headache, sensitivity to light, unusual weakness or fatigue, dizziness, fast or irregular heartbeat, increased sensitivity to cold or heat, excessive sweating, constipation, hair loss, increased thirst or amount of urine, tremors or shaking, irritability Infusion reactions--chest pain, shortness of breath or trouble breathing, feeling faint or lightheaded Kidney injury (glomerulonephritis)--decrease in the amount of urine, red or dark brown urine, foamy or bubbly urine, swelling of the ankles, hands, or feet Liver injury--right upper belly pain, loss of appetite, nausea, light-colored stool, dark yellow or brown urine, yellowing skin or eyes,  unusual weakness or fatigue Pain, tingling, or numbness in the hands or feet, muscle weakness, change in vision, confusion or trouble speaking, loss of balance or coordination, trouble walking, seizures Rash, fever, and swollen lymph nodes Redness, blistering, peeling, or loosening of the skin, including inside the mouth Sudden or severe stomach pain, bloody diarrhea, fever, nausea, vomiting Side effects that usually do not require medical attention (report these to your care team  if they continue or are bothersome): Bone, joint, or muscle pain Diarrhea Fatigue Loss of appetite Nausea Skin rash This list may not describe all possible side effects. Call your doctor for medical advice about side effects. You may report side effects to FDA at 1-800-FDA-1088. Where should I keep my medication? This medication is given in a hospital or clinic. It will not be stored at home. NOTE: This sheet is a summary. It may not cover all possible information. If you have questions about this medicine, talk to your doctor, pharmacist, or health care provider.  2024 [employer]/Gold Standard (2021-12-07 00:00:00)

## 2024-08-15 NOTE — Assessment & Plan Note (Signed)
 Immunotherapy plan as planned.

## 2024-08-15 NOTE — Telephone Encounter (Signed)
 PRN Ondansetron  sent to [facility]

## 2024-08-15 NOTE — Telephone Encounter (Signed)
 Caller verified using pt's full name and dob prior to discussing PHI    Caregiver, Nadeen, called to request antiemetic to have on hand for patient. Prescription needs to be sent to [facility].  Please advise.

## 2024-08-15 NOTE — Assessment & Plan Note (Signed)
 Continue Elqiuis to 2.5mg  BID. He is on Plavix  75mg  daily.

## 2024-08-15 NOTE — Addendum Note (Signed)
 Addended by: BABARA CALL on: 08/15/2024 01:45 PM   Modules accepted: Orders

## 2024-08-15 NOTE — Progress Notes (Signed)
 " Hematology/Oncology Progress note [phone] [phone]      CHIEF COMPLAINTS/PURPOSE OF CONSULTATION:  Stage III Lung cancer  ASSESSMENT & PLAN:   Cancer Staging  Adenocarcinoma of lung [facility]) Staging form: Lung, AJCC 8th Edition - Clinical stage from 03/22/2023: cT1, cN3, cM0 - Signed by Babara Call, MD on 03/22/2023   Adenocarcinoma of lung (HCC) Clinically he has Stage III left lung cancer He has right paratracheal lymph node hypermetabolic activity, although Station 7 lymph node showed atypical cell, no definite malignancy.  Left upper lobe adenocarcinoma, No enough tissue for NGS, liquid biopsy showed actionable variants.   S/p concurrent chemotherapy radiation. -carboplatin  AUC 2 and taxol  45mg /m2  On Durvalumab  maintenance, which was held from July to Nov 2025.  July 2025 CT- increased right middle lobe lesion, s/p  radiation to the right middle lobe lesion suspicious for synchronous neoplasm.  Clinically he is doing well today Labs are reviewed and discussed with patient. Proceed with Durvalumab  maintenance   COPD (chronic obstructive pulmonary disease) (HCC) Continue albuterol  inhaler PRN, Duonebs.  follow up with pulmonology Dr. Theotis.  Continue Singulair , mucinex .   Encounter for antineoplastic immunotherapy Immunotherapy plan as planned.   Pulmonary embolism (HCC) Continue Elqiuis to 2.5mg  BID. He is on Plavix  75mg  daily.      Orders Placed This Encounter  Procedures   CBC with Differential (Cancer Center Only)    Standing Status:   Future    Expected Date:   09/12/2024    Expiration Date:   09/12/2025   CMP (Cancer Center only)    Standing Status:   Future    Expected Date:   09/12/2024    Expiration Date:   09/12/2025   T4    Standing Status:   Future    Expected Date:   09/12/2024    Expiration Date:   09/12/2025   TSH    Standing Status:   Future    Expected Date:   09/12/2024    Expiration Date:   09/12/2025   Follow up 3 weeks All  questions were answered. The patient knows to call the clinic with any problems, questions or concerns.  Call Babara, MD, PhD [facility] Hematology Oncology 08/15/2024    HISTORY OF PRESENTING ILLNESS:  [name] 64 y.o. male presents for follow up of lung cancer.  I have reviewed his chart and materials related to his cancer extensively and collaborated history with the patient. Summary of oncologic history is as follows:  12/08/2021 CT chest with contrast showed irregular solid pulmonary nodule of right upper lobe, increased in size.  Bibasilar consolidation.  Mildly enlarged right lower paratracheal lymph node.  Atherosclerosis. 01/04/2022 PET scan showed 10 mm irregular nodule in the lateral right upper lobe, suspicious for primary bronchogenic neoplasm. 2 small right paratracheal nodes, indeterminate and favored to be reactive.  Attention on follow-up.  Patient was seen by radiation oncology Dr. Lenn.  Patient was offered SBRT to the right upper lobe nodule/presumed non-small cell lung cancer stage I.  Patient lives in a group home.  He reports chronic cough.  Some shortness of breath with exertion. He is a current everyday smoker.  Few cigarettes per day.  He denies any unintentional weight loss, night sweats, fever or chills. Patient is on aspirin  and Plavix    INTERVAL HISTORY [name] is a 64 y.o. male who has above history reviewed by me today presents for follow up visit for lung cancer.  Oncology History  Adenocarcinoma of  lung (HCC)  12/29/2022 Imaging   CT chest with contrast showed Stable radiation changes involving the right upper lobe with a small residual treated nodule.  Decrease in size.  Interval enlargement of the left upper lobe pulmonary nodule.  Stable mediastinal nodes stable advanced emphysematous changes and areas of pulmonary scarring.  Stable bilateral adrenal gland adenomas.  Emphysema   01/30/2023 Imaging   PET 1. Recurrent bronchogenic carcinoma in  the left upper lobe with new and increasingly hypermetabolic mediastinal lymph nodes. No evidence of distant metastatic disease. 2. Possible mild prostate hypermetabolism. Consider laboratory correlation. 3. Liver is mildly heterogeneous, raising suspicion for steatosis. 4. Cholelithiasis. 5. Right adrenal adenoma.  Left adrenal myelolipoma. 6. Aortic atherosclerosis (ICD10-I70.0). Coronary artery calcification. 7. Enlarged pulmonic trunk, indicative of pulmonary arterial hypertension.   03/15/2023 Initial Diagnosis   Adenocarcinoma of left lung   Bronchoscopy showed  Lung, biopsy, Left upper lobe - NON-SMALL CELL CARCINOMA, FAVOR ADENOCARCINOMA.  1. Bronchial Lavage, Left upper lobe - POSITIVE FOR MALIGNANCY. - NON-SMALL CELL CARCINOMA, FAVOR ADENOCARCINOMA. - SEE NOTE. 2. Bronchial Brushing, Left upper lobe - POSITIVE FOR MALIGNANCY. - NON-SMALL CELL CARCINOMA, FAVOR ADENOCARCINOMA. 3. Bronchus, biopsy, left upper lobe - SEE [id number] 4. Fine Needle Aspiration, left upper lobe - POSITIVE FOR MALIGNANCY. - NON-SMALL CELL CARCINOMA, FAVOR ADENOCARCINOMA. 5. Fine Needle Aspiration, station 7 lymph node - ATYPICAL. - BACKGROUND LYMPHOID TISSUE IS PRESENT CONSISTENT WITH LYMPH NODE SAMPLING.    03/15/2023 Procedure   Fiberoptic bronchoscopy with airway inspection and BAL Procedure findings:   Bronchoscope was inserted via ETT  without difficulty.  Posterior oropharynx, epiglottis, arytenoids, false cords and vocal cords were not visualized as these were bypassed by endotracheal tube. The distal trachea was normal in circumference and appearance without mucosal, cartilaginous or branching abnormalities.  The main carina was mildly splayed . All right and left lobar airways were visualized to the Subsegmental level.  Sub- sub segmental carinae were identified in all the distal airways.   Secretions were visible in the following airways and appeared to be clear.  The mucosa was :  friable at LEFT UPPER LOBE   Airways were notable for:        exophytic lesions :n       extrinsic compression in the following distributions: n.       Friable mucosa: y       Teacher, music /pigmentation: n   MUCUS PLUGGING WAS SEVERE ON LEFT SIDE AND COMPLETE OBSTRUCTED MULTIPLE AIRWAYS.  UNABLE TO NAVIGATE THROUGH TRACHEOBRONCHIAL TREE DUE TO SEVERE MUCUS PLUGGING. THERAPEUTIC ASPIRATION WAS PERFORMED X 7 AND WAS DONE BILATERALLY ALTHOUGH WORSE ON LEFT.  BAL WAS PERFORMED AT LEFT UPPER LOBE X 2.     Endobronchial ultrasound assisted hilar and mediastinal lymph node biopsies procedure findings: The fiberoptic bronchoscope was removed and the EBUS scope was introduced. Examination began to evaluate for pathologically enlarged lymph nodes starting on the RIGHT  side progressing to the LEFT side.  All lymph node biopsies performed with 21G  needle. Lymph node biopsies were sent in cytolite for all stations.   STATION 10R - 6mm not biopsied STATION 7 - 1.2cm biopsied 3 times STATION 10L - 5mm not biopsied  STATION 4L - 6mm not biopsied    03/22/2023 Cancer Staging   Staging form: Lung, AJCC 8th Edition - Clinical stage from 03/22/2023: cT1, cN3, cM0 - Signed by Babara Call, MD on 03/22/2023 Stage prefix: Initial diagnosis   04/20/2023 - 06/08/2023 Chemotherapy   Patient is on  Treatment Plan : LUNG Carboplatin  + Paclitaxel  + XRT q7d     09/01/2023 -  Chemotherapy   Patient is on Treatment Plan : LUNG Durvalumab  (10) q14d     10/15/2023 - 10/24/2023 Hospital Admission   Hospitalized due to Influenza A, COPD exacerbation, acute respiratory failure.    11/03/2023 Imaging   CT chest abdomen pelvis w contrast showed CHEST:   1. Stable LEFT upper lobe peribronchial lesion. 2. No evidence of metastatic adenopathy. 3. Stable small RIGHT paratracheal node. 4. Slight improvement in atelectasis in the RIGHT middle lobe.   PELVIS:   1. No evidence of metastatic disease in the abdomen  pelvis. 2. Stable small hypodensity in the spleen and liver. 3. Stable benign RIGHT adrenal adenoma and and LEFT adrenal angiomyolipoma.    02/27/2024 Imaging   CT chest w contrast   1. Unchanged spiculated nodule of the central left upper lobe measuring 1.9 x 1.0 cm, consistent with treated primary lung malignancy. 2. Interval enlargement in a lobulated nodule of the lateral segment right middle lobe encasing a small bronchial, measuring 1.2 x 0.8 cm, previously 0.8 x 0.6 cm. This is highly suspicious for synchronous malignancy or metastasis. Given size this is likely amenable to PET-CT characterization. 3. Unchanged bandlike ground-glass in the peripheral right upper lobe. 4. Unchanged right paratracheal lymph node measuring 1.1 x 1.1 cm. No other enlarged mediastinal, hilar, or axillary lymph nodes. 5. Emphysema and diffuse bilateral bronchial wall thickening. 6. Enlargement of the main pulmonary artery, as can be seen in pulmonary hypertension. 7. Coronary artery disease.   03/18/2024 Imaging   PET scan showed  1. Hypermetabolic 10 mm lateral segment right middle lobe nodule is worrisome for bronchogenic carcinoma. 2. Spiculated left upper lobe nodule and low right paratracheal lymph node have decreased in metabolism in the interval, compatible with treatment response. 3. Focal nodular hypermetabolism in the prostate. Malignancy cannot be excluded. 4. Right adrenal adenoma.  Left adrenal myelolipoma. 5. Aortic atherosclerosis (ICD10-I70.0). Coronary artery calcification. 6. Enlarged pulmonic trunk, indicative of pulmonary arterial hypertension. 7.  Emphysema (ICD10-J43.9).   05/01/2024 - 05/08/2024 Radiation Therapy   Patient completed SBRT to right middle lobe lesion     Discussed the use of AI scribe software for clinical note transcription with the patient, who gave verbal consent to proceed.   Chronic cough and SOB, he feels breathing status is at baseline  today he is on Eliquis  2.5mg  BID and Plavix . No bleeding events.    MEDICAL HISTORY:  Past Medical History:  Diagnosis Date   Acute on chronic respiratory failure with hypoxia (HCC)    Asthma    Coagulation disorder    COPD (chronic obstructive pulmonary disease) (HCC)    Depression    History of hiatal hernia    Hypertension    Hypokalemia    Leukocytosis    Lung mass    Normocytic anemia 07/13/2023   Pneumonia    Schizophrenia (HCC)    Shortness of breath dyspnea    Stroke [facility])    Umbilical hernia    Ventral hernia     SURGICAL HISTORY: Past Surgical History:  Procedure Laterality Date   COLONOSCOPY WITH PROPOFOL  N/A 09/21/2021   Procedure: COLONOSCOPY WITH PROPOFOL ;  Surgeon: Maryruth Ole DASEN, MD;  Location: [facility] ENDOSCOPY;  Service: Endoscopy;  Laterality: N/A;   HERNIA REPAIR     INSERTION OF MESH N/A 12/18/2014   Procedure: INSERTION OF MESH;  Surgeon: Charlie FORBES Fell, MD;  Location: [facility] ORS;  Service:  General;  Laterality: N/A;   SUPRA-UMBILICAL HERNIA  12/18/2014   Procedure: SUPRA-UMBILICAL HERNIA;  Surgeon: Charlie FORBES Fell, MD;  Location: [facility] ORS;  Service: General;;   UMBILICAL HERNIA REPAIR N/A 12/18/2014   Procedure: HERNIA REPAIR UMBILICAL ADULT;  Surgeon: Charlie FORBES Fell, MD;  Location: [facility] ORS;  Service: General;  Laterality: N/A;   VIDEO BRONCHOSCOPY WITH ENDOBRONCHIAL ULTRASOUND N/A 03/15/2023   Procedure: VIDEO BRONCHOSCOPY WITH ENDOBRONCHIAL ULTRASOUND;  Surgeon: Parris Manna, MD;  Location: [facility] ORS;  Service: Thoracic;  Laterality: N/A;    SOCIAL HISTORY: Social History   Socioeconomic History   Marital status: Widowed    Spouse name: Not on file   Number of children: Not on file   Years of education: Not on file   Highest education level: Not on file  Occupational History   Not on file  Tobacco Use   Smoking status: Former    Current packs/day: 0.15    Average packs/day: 0.2 packs/day for 45.0 years (6.8 ttl pk-yrs)    Types:  Cigarettes   Smokeless tobacco: Never  Vaping Use   Vaping status: Never Used  Substance and Sexual Activity   Alcohol use: Not Currently    Alcohol/week: 75.0 standard drinks of alcohol    Types: 75 Cans of beer per week   Drug use: Not Currently    Types: Marijuana, Crack cocaine    Comment: none in 15 yrs   Sexual activity: Not Currently  Other Topics Concern   Not on file  Social History Narrative   Not on file   Social Drivers of Health   Tobacco Use: Medium Risk (08/15/2024)   Patient History    Smoking Tobacco Use: Former    Smokeless Tobacco Use: Never    Passive Exposure: Not on file  Financial Resource Strain: Medium Risk (08/18/2023)   [facility]   Overall Financial Resource Strain (CARDIA)    Difficulty of Paying Living Expenses: Somewhat hard  Food Insecurity: No Food Insecurity (10/17/2023)   Hunger Vital Sign    Worried About Running Out of Food in the Last Year: Never true    Ran Out of Food in the Last Year: Never true  Transportation Needs: No Transportation Needs (10/17/2023)   PRAPARE - Administrator, Civil Service (Medical): No    Lack of Transportation (Non-Medical): No  Physical Activity: Not on file  Stress: Not on file  Social Connections: Not on file  Intimate Partner Violence: Not At Risk (10/17/2023)   Humiliation, Afraid, Rape, and Kick questionnaire    Fear of Current or Ex-Partner: No    Emotionally Abused: No    Physically Abused: No    Sexually Abused: No  Depression (PHQ2-9): Low Risk (08/15/2024)   Depression (PHQ2-9)    PHQ-2 Score: 0  Alcohol Screen: Low Risk (06/16/2023)   Alcohol Screen    Last Alcohol Screening Score (AUDIT): 0  Housing: Low Risk (10/17/2023)   Housing Stability Vital Sign    Unable to Pay for Housing in the Last Year: No    Number of Times Moved in the Last Year: 0    Homeless in the Last Year: No  Utilities: Not At Risk (10/17/2023)   AHC Utilities    Threatened  with loss of utilities: No  Health Literacy: Not on file    FAMILY HISTORY: Family History  Problem Relation Age of Onset   Asthma Mother    Hypertension Father     ALLERGIES:  has no known allergies.  MEDICATIONS:  Current Outpatient Medications  Medication Sig Dispense Refill   albuterol  (VENTOLIN  HFA) 108 (90 Base) MCG/ACT inhaler Inhale 1-2 puffs into the lungs every 6 (six) hours as needed for wheezing or shortness of breath. 8 g 0   apixaban  (ELIQUIS ) 2.5 MG TABS tablet Take 1 tablet (2.5 mg total) by mouth 2 (two) times daily. 60 tablet 10   clopidogrel  (PLAVIX ) 75 MG tablet Take 1 tablet (75 mg total) by mouth daily. 30 tablet 0   clotrimazole -betamethasone  (LOTRISONE ) cream Apply 1 Application topically daily. 30 g 0   diphenhydrAMINE  (BENADRYL ) 50 MG tablet Take 1 tablet (50 mg total) by mouth every 8 (eight) hours as needed for itching. Take 1 tablet the bedtime the night before chemo and the night of chemo. 60 tablet 0   famotidine  (PEPCID ) 20 MG tablet Take 1 tablet (20 mg total) by mouth daily. 30 tablet 0   FLUoxetine  (PROZAC ) 20 MG capsule Take 1 capsule (20 mg total) by mouth daily. 30 capsule 3   Fluticasone -Umeclidin-Vilant (TRELEGY ELLIPTA ) 100-62.5-25 MCG/ACT AEPB Inhale 1 puff into the lungs daily. 28 each 1   gabapentin  (NEURONTIN ) 100 MG capsule Take 100 mg by mouth daily.     guaiFENesin  (MUCINEX ) 600 MG 12 hr tablet Take 1 tablet (600 mg total) by mouth 2 (two) times daily. 30 tablet 1   hydrOXYzine  (ATARAX ) 10 MG tablet Take 10 mg by mouth 3 (three) times daily.     INGREZZA 40 MG capsule Take 40 mg by mouth daily.     mirtazapine  (REMERON ) 7.5 MG tablet Take 1 tablet (7.5 mg total) by mouth at bedtime. 30 tablet 5   montelukast  (SINGULAIR ) 10 MG tablet Take 1 tablet (10 mg total) by mouth at bedtime. 30 tablet 1   potassium chloride  SA (KLOR-CON  M) 20 MEQ tablet Take 1 tablet (20 mEq total) by mouth daily. 3 tablet 0   pravastatin  (PRAVACHOL ) 20 MG tablet  Take 1 tablet (20 mg total) by mouth daily at 6 PM. 30 tablet 0   QUEtiapine  (SEROQUEL ) 300 MG tablet Take 2 tablets (600 mg total) by mouth at bedtime. 60 tablet 0   senna-docusate (SENOKOT-S) 8.6-50 MG tablet Take 1 tablet by mouth daily after breakfast. 30 tablet 0   traZODone  (DESYREL ) 100 MG tablet Take 2 tablets (200 mg total) by mouth at bedtime as needed for sleep. 30 tablet 0   No current facility-administered medications for this visit.   Facility-Administered Medications Ordered in Other Visits  Medication Dose Route Frequency Provider Last Rate Last Admin   0.9 %  sodium chloride  infusion   Intravenous Continuous Babara Call, MD   Stopped at 08/15/24 1134    Review of Systems  Constitutional:  Negative for appetite change, chills, fatigue, fever and unexpected weight change.  HENT:   Negative for hearing loss and voice change.   Eyes:  Negative for eye problems and icterus.  Respiratory:  Negative for chest tightness, cough and hemoptysis.   Cardiovascular:  Negative for chest pain and leg swelling.  Gastrointestinal:  Negative for abdominal distention and abdominal pain.  Endocrine: Negative for hot flashes.  Genitourinary:  Negative for difficulty urinating, dysuria and frequency.   Musculoskeletal:  Negative for arthralgias.  Skin:  Negative for itching and rash.  Neurological:  Negative for light-headedness and numbness.  Hematological:  Negative for adenopathy. Does not bruise/bleed easily.  Psychiatric/Behavioral:  Negative for confusion.      PHYSICAL EXAMINATION: ECOG PERFORMANCE STATUS:  1 - Symptomatic but completely ambulatory  Vitals:   08/15/24 0848  BP: 127/89  Pulse: 78  Resp: 18  Temp: (!) 97.3 F (36.3 C)  SpO2: 95%   Filed Weights   08/15/24 0848  Weight: 209 lb 3.2 oz (94.9 kg)    Physical Exam Constitutional:      General: He is not in acute distress.    Appearance: He is not diaphoretic.  HENT:     Head: Normocephalic and atraumatic.   Eyes:     General: No scleral icterus. Cardiovascular:     Rate and Rhythm: Normal rate and regular rhythm.  Pulmonary:     Effort: Pulmonary effort is normal. No respiratory distress.     Breath sounds: No wheezing.     Comments: Decreased breath sound bilaterally, poor air entry Abdominal:     General: There is no distension.     Palpations: Abdomen is soft.     Tenderness: There is no abdominal tenderness.  Musculoskeletal:        General: Normal range of motion.     Cervical back: Normal range of motion and neck supple.  Skin:    General: Skin is warm and dry.     Findings: Rash present. No erythema.  Neurological:     Mental Status: He is alert and oriented to person, place, and time. Mental status is at baseline.     Motor: No abnormal muscle tone.  Psychiatric:        Mood and Affect: Affect normal.     Comments: Flat      LABORATORY DATA:  I have reviewed the data as listed    Latest Ref Rng & Units 08/15/2024    8:24 AM 07/25/2024    9:23 AM 07/11/2024   12:57 PM  CBC  WBC 4.0 - 10.5 K/uL 4.2  7.9  7.0   Hemoglobin 13.0 - 17.0 g/dL 88.4  87.4  88.1   Hematocrit 39.0 - 52.0 % 35.6  38.5  35.8   Platelets 150 - 400 K/uL 196  223  248       Latest Ref Rng & Units 08/15/2024    8:24 AM 07/25/2024    9:23 AM 07/11/2024   12:57 PM  CMP  Glucose 70 - 99 mg/dL 896  93  878   BUN 8 - 23 mg/dL 7  12  <5   Creatinine 0.61 - 1.24 mg/dL 9.17  9.10  9.32   Sodium 135 - 145 mmol/L 139  136  139   Potassium 3.5 - 5.1 mmol/L 3.9  4.4  3.3   Chloride 98 - 111 mmol/L 105  102  100   CO2 22 - 32 mmol/L 26  24  28    Calcium 8.9 - 10.3 mg/dL 9.2  9.4  9.4   Total Protein 6.5 - 8.1 g/dL 6.9  7.5  7.2   Total Bilirubin 0.0 - 1.2 mg/dL 0.4  0.3  0.4   Alkaline Phos 38 - 126 U/L 71  73  74   AST 15 - 41 U/L 24  23  16    ALT 0 - 44 U/L 21  20  6       RADIOGRAPHIC STUDIES: I have personally reviewed the radiological images as listed and agreed with the findings in the  report. No results found.     "

## 2024-08-15 NOTE — Assessment & Plan Note (Signed)
 Continue albuterol inhaler PRN, Duonebs.  follow up with pulmonology Dr. Meredeth Ide.  Continue Singulair, mucinex.

## 2024-08-15 NOTE — Progress Notes (Signed)
 Patient here for follow up and tx. Med list updated with facility MAR.

## 2024-08-15 NOTE — Assessment & Plan Note (Addendum)
 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  Left upper lobe adenocarcinoma, No enough tissue for NGS, liquid biopsy showed actionable variants.   S/p concurrent chemotherapy radiation. -carboplatin  AUC 2 and taxol  45mg /m2  On Durvalumab  maintenance, which was held from July to Nov 2025.  July 2025 CT- increased right middle lobe lesion, s/p  radiation to the right middle lobe lesion suspicious for synchronous neoplasm.  Clinically he is doing well today Labs are reviewed and discussed with patient. Proceed with Durvalumab  maintenance

## 2024-08-16 LAB — T4: T4, Total: 5.5 ug/dL (ref 4.5–12.0)

## 2024-08-29 ENCOUNTER — Inpatient Hospital Stay

## 2024-08-29 ENCOUNTER — Inpatient Hospital Stay: Admitting: Oncology

## 2024-08-29 ENCOUNTER — Encounter: Payer: Self-pay | Admitting: Oncology

## 2024-08-29 VITALS — BP 143/82 | HR 79 | Resp 18

## 2024-08-29 VITALS — BP 134/88 | HR 86 | Temp 96.1°F | Resp 18 | Wt 216.3 lb

## 2024-08-29 DIAGNOSIS — C3492 Malignant neoplasm of unspecified part of left bronchus or lung: Secondary | ICD-10-CM

## 2024-08-29 DIAGNOSIS — Z5112 Encounter for antineoplastic immunotherapy: Secondary | ICD-10-CM

## 2024-08-29 DIAGNOSIS — I2699 Other pulmonary embolism without acute cor pulmonale: Secondary | ICD-10-CM

## 2024-08-29 DIAGNOSIS — J449 Chronic obstructive pulmonary disease, unspecified: Secondary | ICD-10-CM | POA: Diagnosis not present

## 2024-08-29 DIAGNOSIS — C3412 Malignant neoplasm of upper lobe, left bronchus or lung: Secondary | ICD-10-CM | POA: Diagnosis not present

## 2024-08-29 LAB — CMP (CANCER CENTER ONLY)
ALT: 19 U/L (ref 0–44)
AST: 22 U/L (ref 15–41)
Albumin: 4.1 g/dL (ref 3.5–5.0)
Alkaline Phosphatase: 79 U/L (ref 38–126)
Anion gap: 9 (ref 5–15)
BUN: 7 mg/dL — ABNORMAL LOW (ref 8–23)
CO2: 25 mmol/L (ref 22–32)
Calcium: 9.4 mg/dL (ref 8.9–10.3)
Chloride: 105 mmol/L (ref 98–111)
Creatinine: 0.75 mg/dL (ref 0.61–1.24)
GFR, Estimated: >60 mL/min
Glucose, Bld: 100 mg/dL — ABNORMAL HIGH (ref 70–99)
Potassium: 4.2 mmol/L (ref 3.5–5.1)
Sodium: 139 mmol/L (ref 135–145)
Total Bilirubin: 0.4 mg/dL (ref 0.0–1.2)
Total Protein: 7.2 g/dL (ref 6.5–8.1)

## 2024-08-29 LAB — CBC WITH DIFFERENTIAL (CANCER CENTER ONLY)
Abs Immature Granulocytes: 0.02 K/uL (ref 0.00–0.07)
Basophils Absolute: 0 K/uL (ref 0.0–0.1)
Basophils Relative: 0 %
Eosinophils Absolute: 0 K/uL (ref 0.0–0.5)
Eosinophils Relative: 1 %
HCT: 36.7 % — ABNORMAL LOW (ref 39.0–52.0)
Hemoglobin: 11.7 g/dL — ABNORMAL LOW (ref 13.0–17.0)
Immature Granulocytes: 0 %
Lymphocytes Relative: 21 %
Lymphs Abs: 1.1 K/uL (ref 0.7–4.0)
MCH: 29.2 pg (ref 26.0–34.0)
MCHC: 31.9 g/dL (ref 30.0–36.0)
MCV: 91.5 fL (ref 80.0–100.0)
Monocytes Absolute: 0.5 K/uL (ref 0.1–1.0)
Monocytes Relative: 10 %
Neutro Abs: 3.6 K/uL (ref 1.7–7.7)
Neutrophils Relative %: 68 %
Platelet Count: 181 K/uL (ref 150–400)
RBC: 4.01 MIL/uL — ABNORMAL LOW (ref 4.22–5.81)
RDW: 15.2 % (ref 11.5–15.5)
WBC Count: 5.3 K/uL (ref 4.0–10.5)
nRBC: 0 % (ref 0.0–0.2)

## 2024-08-29 MED ORDER — SODIUM CHLORIDE 0.9 % IV SOLN
INTRAVENOUS | Status: DC
  Filled 2024-08-29: qty 250

## 2024-08-29 MED ORDER — SODIUM CHLORIDE 0.9 % IV SOLN
10.0000 mg/kg | Freq: Once | INTRAVENOUS | Status: AC
  Administered 2024-08-29: 1000 mg via INTRAVENOUS
  Filled 2024-08-29: qty 20

## 2024-08-29 NOTE — Assessment & Plan Note (Signed)
 Continue albuterol inhaler PRN, Duonebs.  follow up with pulmonology Dr. Meredeth Ide.  Continue Singulair, mucinex.

## 2024-08-29 NOTE — Assessment & Plan Note (Signed)
 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  Left upper lobe adenocarcinoma, No enough tissue for NGS, liquid biopsy showed actionable variants.   S/p concurrent chemotherapy radiation. -carboplatin  AUC 2 and taxol  45mg /m2  On Durvalumab  maintenance, which was held from July to Nov 2025.  July 2025 CT- increased right middle lobe lesion, s/p  radiation to the right middle lobe lesion suspicious for synchronous neoplasm.  Clinically he is doing well today Labs are reviewed and discussed with patient. Proceed with Durvalumab  maintenance

## 2024-08-29 NOTE — Patient Instructions (Signed)
 [facility] - A DEPT OF [facility]  Discharge Instructions: Thank you for choosing [facility] to provide your oncology and hematology care.  If you have a lab appointment with the Cancer Center, please go directly to the Cancer Center and check in at the registration area.  Wear comfortable clothing and clothing appropriate for easy access to any Portacath or PICC line.   We strive to give you quality time with your provider. You may need to reschedule your appointment if you arrive late (15 or more minutes).  Arriving late affects you and other patients whose appointments are after yours.  Also, if you miss three or more appointments without notifying the office, you may be dismissed from the clinic at the provider's discretion.      For prescription refill requests, have your pharmacy contact our office and allow 72 hours for refills to be completed.    Today you received the following chemotherapy and/or immunotherapy agents IMFINZI      To help prevent nausea and vomiting after your treatment, we encourage you to take your nausea medication as directed.  BELOW ARE SYMPTOMS THAT SHOULD BE REPORTED IMMEDIATELY: *FEVER GREATER THAN 100.4 F (38 C) OR HIGHER *CHILLS OR SWEATING *NAUSEA AND VOMITING THAT IS NOT CONTROLLED WITH YOUR NAUSEA MEDICATION *UNUSUAL SHORTNESS OF BREATH *UNUSUAL BRUISING OR BLEEDING *URINARY PROBLEMS (pain or burning when urinating, or frequent urination) *BOWEL PROBLEMS (unusual diarrhea, constipation, pain near the anus) TENDERNESS IN MOUTH AND THROAT WITH OR WITHOUT PRESENCE OF ULCERS (sore throat, sores in mouth, or a toothache) UNUSUAL RASH, SWELLING OR PAIN  UNUSUAL VAGINAL DISCHARGE OR ITCHING   Items with * indicate a potential emergency and should be followed up as soon as possible or go to the Emergency Department if any problems should occur.  Please show the CHEMOTHERAPY ALERT CARD or IMMUNOTHERAPY  ALERT CARD at check-in to the Emergency Department and triage nurse.  Should you have questions after your visit or need to cancel or reschedule your appointment, please contact [facility] - A DEPT OF [facility]  [phone] and follow the prompts.  Office hours are 8:00 a.m. to 4:30 p.m. Monday - Friday. Please note that voicemails left after 4:00 p.m. may not be returned until the following business day.  We are closed weekends and major holidays. You have access to a nurse at all times for urgent questions. Please call the main number to the clinic [phone] and follow the prompts.  For any non-urgent questions, you may also contact your provider using MyChart. We now offer e-Visits for anyone 30 and older to request care online for non-urgent symptoms. For details visit [email or web address].   Also download the MyChart app! Go to the app store, search "MyChart", open the app, select [facility], and log in with your MyChart username and password.  Durvalumab Injection What is this medication? DURVALUMAB (dur VAL ue mab) treats some types of cancer. It works by helping your immune system slow or stop the spread of cancer cells. It is a monoclonal antibody. This medicine may be used for other purposes; ask your health care provider or pharmacist if you have questions. COMMON BRAND NAME(S): IMFINZI What should I tell my care team before I take this medication? They need to know if you have any of these conditions: Allogeneic stem cell transplant (uses someone else's stem cells) Autoimmune diseases, such as Crohn disease, ulcerative  colitis, lupus History of chest radiation Nervous system problems, such as Guillain-Barre syndrome, myasthenia gravis Organ transplant An unusual or allergic reaction to durvalumab, other medications, foods, dyes, or preservatives Pregnant or trying to get pregnant Breast-feeding How should I use this medication? This  medication is infused into a vein. It is given by your care team in a hospital or clinic setting. A special MedGuide will be given to you before each treatment. Be sure to read this information carefully each time. Talk to your care team about the use of this medication in children. Special care may be needed. Overdosage: If you think you have taken too much of this medicine contact a poison control center or emergency room at once. NOTE: This medicine is only for you. Do not share this medicine with others. What if I miss a dose? Keep appointments for follow-up doses. It is important not to miss your dose. Call your care team if you are unable to keep an appointment. What may interact with this medication? Interactions have not been studied. This list may not describe all possible interactions. Give your health care provider a list of all the medicines, herbs, non-prescription drugs, or dietary supplements you use. Also tell them if you smoke, drink alcohol, or use illegal drugs. Some items may interact with your medicine. What should I watch for while using this medication? Your condition will be monitored carefully while you are receiving this medication. You may need blood work while taking this medication. This medication may cause serious skin reactions. They can happen weeks to months after starting the medication. Contact your care team right away if you notice fevers or flu-like symptoms with a rash. The rash may be red or purple and then turn into blisters or peeling of the skin. You may also notice a red rash with swelling of the face, lips, or lymph nodes in your neck or under your arms. Tell your care team right away if you have any change in your eyesight. Talk to your care team if you may be pregnant. Serious birth defects can occur if you take this medication during pregnancy and for 3 months after the last dose. You will need a negative pregnancy test before starting this medication.  Contraception is recommended while taking this medication and for 3 months after the last dose. Your care team can help you find the option that works for you. Do not breastfeed while taking this medication and for 3 months after the last dose. What side effects may I notice from receiving this medication? Side effects that you should report to your care team as soon as possible: Allergic reactions--skin rash, itching, hives, swelling of the face, lips, tongue, or throat Dry cough, shortness of breath or trouble breathing Eye pain, redness, irritation, or discharge with blurry or decreased vision Heart muscle inflammation--unusual weakness or fatigue, shortness of breath, chest pain, fast or irregular heartbeat, dizziness, swelling of the ankles, feet, or hands Hormone gland problems--headache, sensitivity to light, unusual weakness or fatigue, dizziness, fast or irregular heartbeat, increased sensitivity to cold or heat, excessive sweating, constipation, hair loss, increased thirst or amount of urine, tremors or shaking, irritability Infusion reactions--chest pain, shortness of breath or trouble breathing, feeling faint or lightheaded Kidney injury (glomerulonephritis)--decrease in the amount of urine, red or dark brown urine, foamy or bubbly urine, swelling of the ankles, hands, or feet Liver injury--right upper belly pain, loss of appetite, nausea, light-colored stool, dark yellow or brown urine, yellowing skin or eyes,  unusual weakness or fatigue Pain, tingling, or numbness in the hands or feet, muscle weakness, change in vision, confusion or trouble speaking, loss of balance or coordination, trouble walking, seizures Rash, fever, and swollen lymph nodes Redness, blistering, peeling, or loosening of the skin, including inside the mouth Sudden or severe stomach pain, bloody diarrhea, fever, nausea, vomiting Side effects that usually do not require medical attention (report these to your care team  if they continue or are bothersome): Bone, joint, or muscle pain Diarrhea Fatigue Loss of appetite Nausea Skin rash This list may not describe all possible side effects. Call your doctor for medical advice about side effects. You may report side effects to FDA at 1-800-FDA-1088. Where should I keep my medication? This medication is given in a hospital or clinic. It will not be stored at home. NOTE: This sheet is a summary. It may not cover all possible information. If you have questions about this medicine, talk to your doctor, pharmacist, or health care provider.  2024 [employer]/Gold Standard (2021-12-07 00:00:00)

## 2024-08-29 NOTE — Progress Notes (Signed)
 " Hematology/Oncology Progress note [phone] [phone]      CHIEF COMPLAINTS/PURPOSE OF CONSULTATION:  Stage III Lung cancer  ASSESSMENT & PLAN:   Cancer Staging  Adenocarcinoma of lung [facility]) Staging form: Lung, AJCC 8th Edition - Clinical stage from 03/22/2023: cT1, cN3, cM0 - Signed by Babara Call, MD on 03/22/2023   Adenocarcinoma of lung (HCC) Clinically he has Stage III left lung cancer He has right paratracheal lymph node hypermetabolic activity, although Station 7 lymph node showed atypical cell, no definite malignancy.  Left upper lobe adenocarcinoma, No enough tissue for NGS, liquid biopsy showed actionable variants.   S/p concurrent chemotherapy radiation. -carboplatin  AUC 2 and taxol  45mg /m2  On Durvalumab  maintenance, which was held from July to Nov 2025.  July 2025 CT- increased right middle lobe lesion, s/p  radiation to the right middle lobe lesion suspicious for synchronous neoplasm.  Clinically he is doing well today Labs are reviewed and discussed with patient. Proceed with Durvalumab  maintenance   COPD (chronic obstructive pulmonary disease) (HCC) Continue albuterol  inhaler PRN, Duonebs.  follow up with pulmonology Dr. Theotis.  Continue Singulair , mucinex .   Encounter for antineoplastic immunotherapy Immunotherapy plan as planned.   Pulmonary embolism (HCC) Continue Elqiuis to 2.5mg  BID. He is on Plavix  75mg  daily.      No orders of the defined types were placed in this encounter.  Follow up 3 weeks All questions were answered. The patient knows to call the clinic with any problems, questions or concerns.  Call Babara, MD, PhD [facility] Hematology Oncology 08/29/2024    HISTORY OF PRESENTING ILLNESS:  [name] 64 y.o. male presents for follow up of lung cancer.  I have reviewed his chart and materials related to his cancer extensively and collaborated history with the patient. Summary of oncologic history is as  follows:  12/08/2021 CT chest with contrast showed irregular solid pulmonary nodule of right upper lobe, increased in size.  Bibasilar consolidation.  Mildly enlarged right lower paratracheal lymph node.  Atherosclerosis. 01/04/2022 PET scan showed 10 mm irregular nodule in the lateral right upper lobe, suspicious for primary bronchogenic neoplasm. 2 small right paratracheal nodes, indeterminate and favored to be reactive.  Attention on follow-up.  Patient was seen by radiation oncology Dr. Lenn.  Patient was offered SBRT to the right upper lobe nodule/presumed non-small cell lung cancer stage I.  Patient lives in a group home.  He reports chronic cough.  Some shortness of breath with exertion. He is a current everyday smoker.  Few cigarettes per day.  He denies any unintentional weight loss, night sweats, fever or chills. Patient is on aspirin  and Plavix    INTERVAL HISTORY [name] is a 64 y.o. male who has above history reviewed by me today presents for follow up visit for lung cancer.  Oncology History  Adenocarcinoma of lung (HCC)  12/29/2022 Imaging   CT chest with contrast showed Stable radiation changes involving the right upper lobe with a small residual treated nodule.  Decrease in size.  Interval enlargement of the left upper lobe pulmonary nodule.  Stable mediastinal nodes stable advanced emphysematous changes and areas of pulmonary scarring.  Stable bilateral adrenal gland adenomas.  Emphysema   01/30/2023 Imaging   PET 1. Recurrent bronchogenic carcinoma in the left upper lobe with new and increasingly hypermetabolic mediastinal lymph nodes. No evidence of distant metastatic disease. 2. Possible mild prostate hypermetabolism. Consider laboratory correlation. 3. Liver is mildly heterogeneous, raising suspicion for steatosis. 4. Cholelithiasis. 5. Right  adrenal adenoma.  Left adrenal myelolipoma. 6. Aortic atherosclerosis (ICD10-I70.0). Coronary artery calcification. 7.  Enlarged pulmonic trunk, indicative of pulmonary arterial hypertension.   03/15/2023 Initial Diagnosis   Adenocarcinoma of left lung   Bronchoscopy showed  Lung, biopsy, Left upper lobe - NON-SMALL CELL CARCINOMA, FAVOR ADENOCARCINOMA.  1. Bronchial Lavage, Left upper lobe - POSITIVE FOR MALIGNANCY. - NON-SMALL CELL CARCINOMA, FAVOR ADENOCARCINOMA. - SEE NOTE. 2. Bronchial Brushing, Left upper lobe - POSITIVE FOR MALIGNANCY. - NON-SMALL CELL CARCINOMA, FAVOR ADENOCARCINOMA. 3. Bronchus, biopsy, left upper lobe - SEE [id number] 4. Fine Needle Aspiration, left upper lobe - POSITIVE FOR MALIGNANCY. - NON-SMALL CELL CARCINOMA, FAVOR ADENOCARCINOMA. 5. Fine Needle Aspiration, station 7 lymph node - ATYPICAL. - BACKGROUND LYMPHOID TISSUE IS PRESENT CONSISTENT WITH LYMPH NODE SAMPLING.    03/15/2023 Procedure   Fiberoptic bronchoscopy with airway inspection and BAL Procedure findings:   Bronchoscope was inserted via ETT  without difficulty.  Posterior oropharynx, epiglottis, arytenoids, false cords and vocal cords were not visualized as these were bypassed by endotracheal tube. The distal trachea was normal in circumference and appearance without mucosal, cartilaginous or branching abnormalities.  The main carina was mildly splayed . All right and left lobar airways were visualized to the Subsegmental level.  Sub- sub segmental carinae were identified in all the distal airways.   Secretions were visible in the following airways and appeared to be clear.  The mucosa was : friable at LEFT UPPER LOBE   Airways were notable for:        exophytic lesions :n       extrinsic compression in the following distributions: n.       Friable mucosa: y       Teacher, music /pigmentation: n   MUCUS PLUGGING WAS SEVERE ON LEFT SIDE AND COMPLETE OBSTRUCTED MULTIPLE AIRWAYS.  UNABLE TO NAVIGATE THROUGH TRACHEOBRONCHIAL TREE DUE TO SEVERE MUCUS PLUGGING. THERAPEUTIC ASPIRATION WAS PERFORMED X 7  AND WAS DONE BILATERALLY ALTHOUGH WORSE ON LEFT.  BAL WAS PERFORMED AT LEFT UPPER LOBE X 2.     Endobronchial ultrasound assisted hilar and mediastinal lymph node biopsies procedure findings: The fiberoptic bronchoscope was removed and the EBUS scope was introduced. Examination began to evaluate for pathologically enlarged lymph nodes starting on the RIGHT  side progressing to the LEFT side.  All lymph node biopsies performed with 21G  needle. Lymph node biopsies were sent in cytolite for all stations.   STATION 10R - 6mm not biopsied STATION 7 - 1.2cm biopsied 3 times STATION 10L - 5mm not biopsied  STATION 4L - 6mm not biopsied    03/22/2023 Cancer Staging   Staging form: Lung, AJCC 8th Edition - Clinical stage from 03/22/2023: cT1, cN3, cM0 - Signed by Babara Call, MD on 03/22/2023 Stage prefix: Initial diagnosis   04/20/2023 - 06/08/2023 Chemotherapy   Patient is on Treatment Plan : LUNG Carboplatin  + Paclitaxel  + XRT q7d     09/01/2023 -  Chemotherapy   Patient is on Treatment Plan : LUNG Durvalumab  (10) q14d     10/15/2023 - 10/24/2023 Hospital Admission   Hospitalized due to Influenza A, COPD exacerbation, acute respiratory failure.    11/03/2023 Imaging   CT chest abdomen pelvis w contrast showed CHEST:   1. Stable LEFT upper lobe peribronchial lesion. 2. No evidence of metastatic adenopathy. 3. Stable small RIGHT paratracheal node. 4. Slight improvement in atelectasis in the RIGHT middle lobe.   PELVIS:   1. No evidence of metastatic disease in the abdomen pelvis.  2. Stable small hypodensity in the spleen and liver. 3. Stable benign RIGHT adrenal adenoma and and LEFT adrenal angiomyolipoma.    02/27/2024 Imaging   CT chest w contrast   1. Unchanged spiculated nodule of the central left upper lobe measuring 1.9 x 1.0 cm, consistent with treated primary lung malignancy. 2. Interval enlargement in a lobulated nodule of the lateral segment right middle lobe encasing a small  bronchial, measuring 1.2 x 0.8 cm, previously 0.8 x 0.6 cm. This is highly suspicious for synchronous malignancy or metastasis. Given size this is likely amenable to PET-CT characterization. 3. Unchanged bandlike ground-glass in the peripheral right upper lobe. 4. Unchanged right paratracheal lymph node measuring 1.1 x 1.1 cm. No other enlarged mediastinal, hilar, or axillary lymph nodes. 5. Emphysema and diffuse bilateral bronchial wall thickening. 6. Enlargement of the main pulmonary artery, as can be seen in pulmonary hypertension. 7. Coronary artery disease.   03/18/2024 Imaging   PET scan showed  1. Hypermetabolic 10 mm lateral segment right middle lobe nodule is worrisome for bronchogenic carcinoma. 2. Spiculated left upper lobe nodule and low right paratracheal lymph node have decreased in metabolism in the interval, compatible with treatment response. 3. Focal nodular hypermetabolism in the prostate. Malignancy cannot be excluded. 4. Right adrenal adenoma.  Left adrenal myelolipoma. 5. Aortic atherosclerosis (ICD10-I70.0). Coronary artery calcification. 6. Enlarged pulmonic trunk, indicative of pulmonary arterial hypertension. 7.  Emphysema (ICD10-J43.9).   05/01/2024 - 05/08/2024 Radiation Therapy   Patient completed SBRT to right middle lobe lesion     Discussed the use of AI scribe software for clinical note transcription with the patient, who gave verbal consent to proceed.   Chronic cough and SOB, he feels breathing status is at baseline today he is on Eliquis  2.5mg  BID and Plavix . No bleeding events.    MEDICAL HISTORY:  Past Medical History:  Diagnosis Date   Acute on chronic respiratory failure with hypoxia (HCC)    Asthma    Coagulation disorder    COPD (chronic obstructive pulmonary disease) (HCC)    Depression    History of hiatal hernia    Hypertension    Hypokalemia    Leukocytosis    Lung mass    Normocytic anemia 07/13/2023   Pneumonia     Schizophrenia (HCC)    Shortness of breath dyspnea    Stroke [facility])    Umbilical hernia    Ventral hernia     SURGICAL HISTORY: Past Surgical History:  Procedure Laterality Date   COLONOSCOPY WITH PROPOFOL  N/A 09/21/2021   Procedure: COLONOSCOPY WITH PROPOFOL ;  Surgeon: Maryruth Ole DASEN, MD;  Location: [facility] ENDOSCOPY;  Service: Endoscopy;  Laterality: N/A;   HERNIA REPAIR     INSERTION OF MESH N/A 12/18/2014   Procedure: INSERTION OF MESH;  Surgeon: Charlie FORBES Fell, MD;  Location: [facility] ORS;  Service: General;  Laterality: N/A;   SUPRA-UMBILICAL HERNIA  12/18/2014   Procedure: SUPRA-UMBILICAL HERNIA;  Surgeon: Charlie FORBES Fell, MD;  Location: [facility] ORS;  Service: General;;   UMBILICAL HERNIA REPAIR N/A 12/18/2014   Procedure: HERNIA REPAIR UMBILICAL ADULT;  Surgeon: Charlie FORBES Fell, MD;  Location: [facility] ORS;  Service: General;  Laterality: N/A;   VIDEO BRONCHOSCOPY WITH ENDOBRONCHIAL ULTRASOUND N/A 03/15/2023   Procedure: VIDEO BRONCHOSCOPY WITH ENDOBRONCHIAL ULTRASOUND;  Surgeon: Parris Manna, MD;  Location: [facility] ORS;  Service: Thoracic;  Laterality: N/A;    SOCIAL HISTORY: Social History   Socioeconomic History   Marital status: Widowed    Spouse name: Not  on file   Number of children: Not on file   Years of education: Not on file   Highest education level: Not on file  Occupational History   Not on file  Tobacco Use   Smoking status: Former    Current packs/day: 0.15    Average packs/day: 0.2 packs/day for 45.0 years (6.8 ttl pk-yrs)    Types: Cigarettes   Smokeless tobacco: Never  Vaping Use   Vaping status: Never Used  Substance and Sexual Activity   Alcohol use: Not Currently    Alcohol/week: 75.0 standard drinks of alcohol    Types: 75 Cans of beer per week   Drug use: Not Currently    Types: Marijuana, Crack cocaine    Comment: none in 15 yrs   Sexual activity: Not Currently  Other Topics Concern   Not on file  Social History Narrative   Not on file    Social Drivers of Health   Tobacco Use: Medium Risk (08/29/2024)   Patient History    Smoking Tobacco Use: Former    Smokeless Tobacco Use: Never    Passive Exposure: Not on file  Financial Resource Strain: Medium Risk (08/18/2023)   Received from [facility]   Overall Financial Resource Strain (CARDIA)    Difficulty of Paying Living Expenses: Somewhat hard  Food Insecurity: No Food Insecurity (10/17/2023)   Hunger Vital Sign    Worried About Running Out of Food in the Last Year: Never true    Ran Out of Food in the Last Year: Never true  Transportation Needs: No Transportation Needs (10/17/2023)   PRAPARE - Administrator, Civil Service (Medical): No    Lack of Transportation (Non-Medical): No  Physical Activity: Not on file  Stress: Not on file  Social Connections: Not on file  Intimate Partner Violence: Not At Risk (10/17/2023)   Humiliation, Afraid, Rape, and Kick questionnaire    Fear of Current or Ex-Partner: No    Emotionally Abused: No    Physically Abused: No    Sexually Abused: No  Depression (PHQ2-9): Low Risk (08/29/2024)   Depression (PHQ2-9)    PHQ-2 Score: 0  Alcohol Screen: Low Risk (06/16/2023)   Alcohol Screen    Last Alcohol Screening Score (AUDIT): 0  Housing: Low Risk (10/17/2023)   Housing Stability Vital Sign    Unable to Pay for Housing in the Last Year: No    Number of Times Moved in the Last Year: 0    Homeless in the Last Year: No  Utilities: Not At Risk (10/17/2023)   AHC Utilities    Threatened with loss of utilities: No  Health Literacy: Not on file    FAMILY HISTORY: Family History  Problem Relation Age of Onset   Asthma Mother    Hypertension Father     ALLERGIES:  has no known allergies.  MEDICATIONS:  Current Outpatient Medications  Medication Sig Dispense Refill   albuterol  (VENTOLIN  HFA) 108 (90 Base) MCG/ACT inhaler Inhale 1-2 puffs into the lungs every 6 (six) hours as needed for wheezing or  shortness of breath. 8 g 0   apixaban  (ELIQUIS ) 2.5 MG TABS tablet Take 1 tablet (2.5 mg total) by mouth 2 (two) times daily. 60 tablet 10   clopidogrel  (PLAVIX ) 75 MG tablet Take 1 tablet (75 mg total) by mouth daily. 30 tablet 0   clotrimazole -betamethasone  (LOTRISONE ) cream Apply 1 Application topically daily. 30 g 0   diphenhydrAMINE  (BENADRYL ) 50 MG tablet Take 1 tablet (  50 mg total) by mouth every 8 (eight) hours as needed for itching. Take 1 tablet the bedtime the night before chemo and the night of chemo. 60 tablet 0   famotidine  (PEPCID ) 20 MG tablet Take 1 tablet (20 mg total) by mouth daily. 30 tablet 0   FLUoxetine  (PROZAC ) 20 MG capsule Take 1 capsule (20 mg total) by mouth daily. 30 capsule 3   Fluticasone -Umeclidin-Vilant (TRELEGY ELLIPTA ) 100-62.5-25 MCG/ACT AEPB Inhale 1 puff into the lungs daily. 28 each 1   gabapentin  (NEURONTIN ) 100 MG capsule Take 100 mg by mouth daily.     guaiFENesin  (MUCINEX ) 600 MG 12 hr tablet Take 1 tablet (600 mg total) by mouth 2 (two) times daily. 30 tablet 1   hydrOXYzine  (ATARAX ) 10 MG tablet Take 10 mg by mouth 3 (three) times daily.     INGREZZA 40 MG capsule Take 40 mg by mouth daily.     mirtazapine  (REMERON ) 7.5 MG tablet Take 1 tablet (7.5 mg total) by mouth at bedtime. 30 tablet 5   montelukast  (SINGULAIR ) 10 MG tablet Take 1 tablet (10 mg total) by mouth at bedtime. 30 tablet 1   ondansetron  (ZOFRAN ) 4 MG tablet Take 1 tablet (4 mg total) by mouth every 8 (eight) hours as needed for nausea or vomiting. 30 tablet 0   potassium chloride  SA (KLOR-CON  M) 20 MEQ tablet Take 1 tablet (20 mEq total) by mouth daily. 3 tablet 0   pravastatin  (PRAVACHOL ) 20 MG tablet Take 1 tablet (20 mg total) by mouth daily at 6 PM. 30 tablet 0   QUEtiapine  (SEROQUEL ) 300 MG tablet Take 2 tablets (600 mg total) by mouth at bedtime. 60 tablet 0   senna-docusate (SENOKOT-S) 8.6-50 MG tablet Take 1 tablet by mouth daily after breakfast. 30 tablet 0   traZODone   (DESYREL ) 100 MG tablet Take 2 tablets (200 mg total) by mouth at bedtime as needed for sleep. 30 tablet 0   No current facility-administered medications for this visit.    Review of Systems  Constitutional:  Negative for appetite change, chills, fatigue, fever and unexpected weight change.  HENT:   Negative for hearing loss and voice change.   Eyes:  Negative for eye problems and icterus.  Respiratory:  Negative for chest tightness, cough and hemoptysis.   Cardiovascular:  Negative for chest pain and leg swelling.  Gastrointestinal:  Negative for abdominal distention and abdominal pain.  Endocrine: Negative for hot flashes.  Genitourinary:  Negative for difficulty urinating, dysuria and frequency.   Musculoskeletal:  Negative for arthralgias.  Skin:  Negative for itching and rash.  Neurological:  Negative for light-headedness and numbness.  Hematological:  Negative for adenopathy. Does not bruise/bleed easily.  Psychiatric/Behavioral:  Negative for confusion.      PHYSICAL EXAMINATION: ECOG PERFORMANCE STATUS: 1 - Symptomatic but completely ambulatory  Vitals:   08/29/24 0909 08/29/24 0916  BP: (!) 150/87 134/88  Pulse: 86   Resp: 18   Temp: (!) 96.1 F (35.6 C)   SpO2: 99%    Filed Weights   08/29/24 0909  Weight: 216 lb 4.8 oz (98.1 kg)    Physical Exam Constitutional:      General: He is not in acute distress.    Appearance: He is not diaphoretic.  HENT:     Head: Normocephalic and atraumatic.  Eyes:     General: No scleral icterus. Cardiovascular:     Rate and Rhythm: Normal rate and regular rhythm.  Pulmonary:     Effort: Pulmonary  effort is normal. No respiratory distress.     Breath sounds: Normal breath sounds. No wheezing.     Comments: Decreased breath sound bilaterally, poor air entry Abdominal:     General: There is no distension.     Palpations: Abdomen is soft.  Musculoskeletal:        General: Normal range of motion.     Cervical back: Normal  range of motion and neck supple.  Skin:    General: Skin is warm and dry.     Findings: No erythema.  Neurological:     Mental Status: He is alert and oriented to person, place, and time. Mental status is at baseline.     Motor: No abnormal muscle tone.  Psychiatric:        Mood and Affect: Affect normal.     Comments: Flat      LABORATORY DATA:  I have reviewed the data as listed    Latest Ref Rng & Units 08/29/2024    8:59 AM 08/15/2024    8:24 AM 07/25/2024    9:23 AM  CBC  WBC 4.0 - 10.5 K/uL 5.3  4.2  7.9   Hemoglobin 13.0 - 17.0 g/dL 88.2  88.4  87.4   Hematocrit 39.0 - 52.0 % 36.7  35.6  38.5   Platelets 150 - 400 K/uL 181  196  223       Latest Ref Rng & Units 08/29/2024    8:59 AM 08/15/2024    8:24 AM 07/25/2024    9:23 AM  CMP  Glucose 70 - 99 mg/dL 899  896  93   BUN 8 - 23 mg/dL 7  7  12    Creatinine 0.61 - 1.24 mg/dL 9.24  9.17  9.10   Sodium 135 - 145 mmol/L 139  139  136   Potassium 3.5 - 5.1 mmol/L 4.2  3.9  4.4   Chloride 98 - 111 mmol/L 105  105  102   CO2 22 - 32 mmol/L 25  26  24    Calcium 8.9 - 10.3 mg/dL 9.4  9.2  9.4   Total Protein 6.5 - 8.1 g/dL 7.2  6.9  7.5   Total Bilirubin 0.0 - 1.2 mg/dL 0.4  0.4  0.3   Alkaline Phos 38 - 126 U/L 79  71  73   AST 15 - 41 U/L 22  24  23    ALT 0 - 44 U/L 19  21  20       RADIOGRAPHIC STUDIES: I have personally reviewed the radiological images as listed and agreed with the findings in the report. No results found.     "

## 2024-08-29 NOTE — Assessment & Plan Note (Signed)
 Immunotherapy plan as planned.

## 2024-08-29 NOTE — Assessment & Plan Note (Signed)
 Continue Elqiuis to 2.5mg  BID. He is on Plavix  75mg  daily.

## 2024-09-12 ENCOUNTER — Inpatient Hospital Stay: Attending: Oncology

## 2024-09-12 ENCOUNTER — Inpatient Hospital Stay: Admitting: Oncology

## 2024-09-12 ENCOUNTER — Inpatient Hospital Stay

## 2024-09-12 ENCOUNTER — Encounter: Payer: Self-pay | Admitting: Oncology

## 2024-09-12 VITALS — BP 135/84 | HR 85 | Temp 97.2°F | Resp 18 | Wt 213.1 lb

## 2024-09-12 DIAGNOSIS — Z5112 Encounter for antineoplastic immunotherapy: Secondary | ICD-10-CM

## 2024-09-12 DIAGNOSIS — J449 Chronic obstructive pulmonary disease, unspecified: Secondary | ICD-10-CM

## 2024-09-12 DIAGNOSIS — C3492 Malignant neoplasm of unspecified part of left bronchus or lung: Secondary | ICD-10-CM

## 2024-09-12 DIAGNOSIS — I2699 Other pulmonary embolism without acute cor pulmonale: Secondary | ICD-10-CM

## 2024-09-12 LAB — CMP (CANCER CENTER ONLY)
ALT: 28 U/L (ref 0–44)
AST: 27 U/L (ref 15–41)
Albumin: 4.1 g/dL (ref 3.5–5.0)
Alkaline Phosphatase: 80 U/L (ref 38–126)
Anion gap: 10 (ref 5–15)
BUN: 8 mg/dL (ref 8–23)
CO2: 27 mmol/L (ref 22–32)
Calcium: 9.4 mg/dL (ref 8.9–10.3)
Chloride: 105 mmol/L (ref 98–111)
Creatinine: 0.86 mg/dL (ref 0.61–1.24)
GFR, Estimated: >60 mL/min
Glucose, Bld: 108 mg/dL — ABNORMAL HIGH (ref 70–99)
Potassium: 4.3 mmol/L (ref 3.5–5.1)
Sodium: 141 mmol/L (ref 135–145)
Total Bilirubin: 0.4 mg/dL (ref 0.0–1.2)
Total Protein: 6.9 g/dL (ref 6.5–8.1)

## 2024-09-12 LAB — CBC WITH DIFFERENTIAL (CANCER CENTER ONLY)
Abs Immature Granulocytes: 0.02 10*3/uL (ref 0.00–0.07)
Basophils Absolute: 0 10*3/uL (ref 0.0–0.1)
Basophils Relative: 0 %
Eosinophils Absolute: 0.1 10*3/uL (ref 0.0–0.5)
Eosinophils Relative: 1 %
HCT: 38.6 % — ABNORMAL LOW (ref 39.0–52.0)
Hemoglobin: 12.3 g/dL — ABNORMAL LOW (ref 13.0–17.0)
Immature Granulocytes: 0 %
Lymphocytes Relative: 17 %
Lymphs Abs: 1.1 10*3/uL (ref 0.7–4.0)
MCH: 29.1 pg (ref 26.0–34.0)
MCHC: 31.9 g/dL (ref 30.0–36.0)
MCV: 91.3 fL (ref 80.0–100.0)
Monocytes Absolute: 0.6 10*3/uL (ref 0.1–1.0)
Monocytes Relative: 10 %
Neutro Abs: 4.4 10*3/uL (ref 1.7–7.7)
Neutrophils Relative %: 72 %
Platelet Count: 204 10*3/uL (ref 150–400)
RBC: 4.23 MIL/uL (ref 4.22–5.81)
RDW: 15.1 % (ref 11.5–15.5)
WBC Count: 6.1 10*3/uL (ref 4.0–10.5)
nRBC: 0 % (ref 0.0–0.2)

## 2024-09-12 MED ORDER — SODIUM CHLORIDE 0.9 % IV SOLN
INTRAVENOUS | Status: DC
  Filled 2024-09-12: qty 250

## 2024-09-12 MED ORDER — SODIUM CHLORIDE 0.9 % IV SOLN
10.0000 mg/kg | Freq: Once | INTRAVENOUS | Status: AC
  Administered 2024-09-12: 1000 mg via INTRAVENOUS
  Filled 2024-09-12: qty 20

## 2024-09-12 NOTE — Progress Notes (Signed)
 " Hematology/Oncology Progress note [phone] [phone]      CHIEF COMPLAINTS/PURPOSE OF CONSULTATION:  Stage III Lung cancer  ASSESSMENT & PLAN:   Cancer Staging  Adenocarcinoma of lung [facility]) Staging form: Lung, AJCC 8th Edition - Clinical stage from 03/22/2023: cT1, cN3, cM0 - Signed by Babara Call, MD on 03/22/2023   Adenocarcinoma of lung (HCC) Clinically he has Stage III left lung cancer He has right paratracheal lymph node hypermetabolic activity, although Station 7 lymph node showed atypical cell, no definite malignancy.  Left upper lobe adenocarcinoma, No enough tissue for NGS, liquid biopsy showed actionable variants.   S/p concurrent chemotherapy radiation. -carboplatin  AUC 2 and taxol  45mg /m2  On Durvalumab  maintenance, which was held from July to Nov 2025.  July 2025 CT- increased right middle lobe lesion, s/p  radiation to the right middle lobe lesion suspicious for synchronous neoplasm.  Clinically he is doing well today Labs are reviewed and discussed with patient. Proceed with Durvalumab  maintenance   Pulmonary embolism (HCC) Continue Elqiuis to 2.5mg  BID. He is on Plavix  75mg  daily.    Encounter for antineoplastic immunotherapy Immunotherapy plan as planned.   COPD (chronic obstructive pulmonary disease) (HCC) Continue albuterol  inhaler PRN, Duonebs.  follow up with pulmonology Dr. Theotis.  Continue Singulair , mucinex .      Orders Placed This Encounter  Procedures   CBC with Differential (Cancer Center Only)    Standing Status:   Future    Expected Date:   09/26/2024    Expiration Date:   09/26/2025   CMP (Cancer Center only)    Standing Status:   Future    Expected Date:   09/26/2024    Expiration Date:   09/26/2025   CBC with Differential (Cancer Center Only)    Standing Status:   Future    Expected Date:   10/10/2024    Expiration Date:   10/10/2025   CMP (Cancer Center only)    Standing Status:   Future    Expected Date:    10/10/2024    Expiration Date:   10/10/2025   T4    Standing Status:   Future    Expected Date:   10/10/2024    Expiration Date:   10/10/2025   TSH    Standing Status:   Future    Expected Date:   10/10/2024    Expiration Date:   10/10/2025   Follow up 2 weeks All questions were answered. The patient knows to call the clinic with any problems, questions or concerns.  Call Babara, MD, PhD [facility] Hematology Oncology 09/12/2024    HISTORY OF PRESENTING ILLNESS:  [name] 64 y.o. male presents for follow up of lung cancer.  I have reviewed his chart and materials related to his cancer extensively and collaborated history with the patient. Summary of oncologic history is as follows:  12/08/2021 CT chest with contrast showed irregular solid pulmonary nodule of right upper lobe, increased in size.  Bibasilar consolidation.  Mildly enlarged right lower paratracheal lymph node.  Atherosclerosis. 01/04/2022 PET scan showed 10 mm irregular nodule in the lateral right upper lobe, suspicious for primary bronchogenic neoplasm. 2 small right paratracheal nodes, indeterminate and favored to be reactive.  Attention on follow-up.  Patient was seen by radiation oncology Dr. Lenn.  Patient was offered SBRT to the right upper lobe nodule/presumed non-small cell lung cancer stage I.  Patient lives in a group home.  He reports chronic cough.  Some shortness of breath with exertion.  He is a current everyday smoker.  Few cigarettes per day.  He denies any unintentional weight loss, night sweats, fever or chills. Patient is on aspirin  and Plavix    INTERVAL HISTORY [name] is a 64 y.o. male who has above history reviewed by me today presents for follow up visit for lung cancer.  Oncology History  Adenocarcinoma of lung (HCC)  12/29/2022 Imaging   CT chest with contrast showed Stable radiation changes involving the right upper lobe with a small residual treated nodule.  Decrease in size.  Interval  enlargement of the left upper lobe pulmonary nodule.  Stable mediastinal nodes stable advanced emphysematous changes and areas of pulmonary scarring.  Stable bilateral adrenal gland adenomas.  Emphysema   01/30/2023 Imaging   PET 1. Recurrent bronchogenic carcinoma in the left upper lobe with new and increasingly hypermetabolic mediastinal lymph nodes. No evidence of distant metastatic disease. 2. Possible mild prostate hypermetabolism. Consider laboratory correlation. 3. Liver is mildly heterogeneous, raising suspicion for steatosis. 4. Cholelithiasis. 5. Right adrenal adenoma.  Left adrenal myelolipoma. 6. Aortic atherosclerosis (ICD10-I70.0). Coronary artery calcification. 7. Enlarged pulmonic trunk, indicative of pulmonary arterial hypertension.   03/15/2023 Initial Diagnosis   Adenocarcinoma of left lung   Bronchoscopy showed  Lung, biopsy, Left upper lobe - NON-SMALL CELL CARCINOMA, FAVOR ADENOCARCINOMA.  1. Bronchial Lavage, Left upper lobe - POSITIVE FOR MALIGNANCY. - NON-SMALL CELL CARCINOMA, FAVOR ADENOCARCINOMA. - SEE NOTE. 2. Bronchial Brushing, Left upper lobe - POSITIVE FOR MALIGNANCY. - NON-SMALL CELL CARCINOMA, FAVOR ADENOCARCINOMA. 3. Bronchus, biopsy, left upper lobe - SEE [id number] 4. Fine Needle Aspiration, left upper lobe - POSITIVE FOR MALIGNANCY. - NON-SMALL CELL CARCINOMA, FAVOR ADENOCARCINOMA. 5. Fine Needle Aspiration, station 7 lymph node - ATYPICAL. - BACKGROUND LYMPHOID TISSUE IS PRESENT CONSISTENT WITH LYMPH NODE SAMPLING.    03/15/2023 Procedure   Fiberoptic bronchoscopy with airway inspection and BAL Procedure findings:   Bronchoscope was inserted via ETT  without difficulty.  Posterior oropharynx, epiglottis, arytenoids, false cords and vocal cords were not visualized as these were bypassed by endotracheal tube. The distal trachea was normal in circumference and appearance without mucosal, cartilaginous or branching abnormalities.  The main  carina was mildly splayed . All right and left lobar airways were visualized to the Subsegmental level.  Sub- sub segmental carinae were identified in all the distal airways.   Secretions were visible in the following airways and appeared to be clear.  The mucosa was : friable at LEFT UPPER LOBE   Airways were notable for:        exophytic lesions :n       extrinsic compression in the following distributions: n.       Friable mucosa: y       Teacher, music /pigmentation: n   MUCUS PLUGGING WAS SEVERE ON LEFT SIDE AND COMPLETE OBSTRUCTED MULTIPLE AIRWAYS.  UNABLE TO NAVIGATE THROUGH TRACHEOBRONCHIAL TREE DUE TO SEVERE MUCUS PLUGGING. THERAPEUTIC ASPIRATION WAS PERFORMED X 7 AND WAS DONE BILATERALLY ALTHOUGH WORSE ON LEFT.  BAL WAS PERFORMED AT LEFT UPPER LOBE X 2.     Endobronchial ultrasound assisted hilar and mediastinal lymph node biopsies procedure findings: The fiberoptic bronchoscope was removed and the EBUS scope was introduced. Examination began to evaluate for pathologically enlarged lymph nodes starting on the RIGHT  side progressing to the LEFT side.  All lymph node biopsies performed with 21G  needle. Lymph node biopsies were sent in cytolite for all stations.   STATION 10R - 6mm not biopsied STATION 7 -  1.2cm biopsied 3 times STATION 10L - 5mm not biopsied  STATION 4L - 6mm not biopsied    03/22/2023 Cancer Staging   Staging form: Lung, AJCC 8th Edition - Clinical stage from 03/22/2023: cT1, cN3, cM0 - Signed by Babara Call, MD on 03/22/2023 Stage prefix: Initial diagnosis   04/20/2023 - 06/08/2023 Chemotherapy   Patient is on Treatment Plan : LUNG Carboplatin  + Paclitaxel  + XRT q7d     09/01/2023 -  Chemotherapy   Patient is on Treatment Plan : LUNG Durvalumab  (10) q14d     10/15/2023 - 10/24/2023 Hospital Admission   Hospitalized due to Influenza A, COPD exacerbation, acute respiratory failure.    11/03/2023 Imaging   CT chest abdomen pelvis w contrast showed CHEST:    1. Stable LEFT upper lobe peribronchial lesion. 2. No evidence of metastatic adenopathy. 3. Stable small RIGHT paratracheal node. 4. Slight improvement in atelectasis in the RIGHT middle lobe.   PELVIS:   1. No evidence of metastatic disease in the abdomen pelvis. 2. Stable small hypodensity in the spleen and liver. 3. Stable benign RIGHT adrenal adenoma and and LEFT adrenal angiomyolipoma.    02/27/2024 Imaging   CT chest w contrast   1. Unchanged spiculated nodule of the central left upper lobe measuring 1.9 x 1.0 cm, consistent with treated primary lung malignancy. 2. Interval enlargement in a lobulated nodule of the lateral segment right middle lobe encasing a small bronchial, measuring 1.2 x 0.8 cm, previously 0.8 x 0.6 cm. This is highly suspicious for synchronous malignancy or metastasis. Given size this is likely amenable to PET-CT characterization. 3. Unchanged bandlike ground-glass in the peripheral right upper lobe. 4. Unchanged right paratracheal lymph node measuring 1.1 x 1.1 cm. No other enlarged mediastinal, hilar, or axillary lymph nodes. 5. Emphysema and diffuse bilateral bronchial wall thickening. 6. Enlargement of the main pulmonary artery, as can be seen in pulmonary hypertension. 7. Coronary artery disease.   03/18/2024 Imaging   PET scan showed  1. Hypermetabolic 10 mm lateral segment right middle lobe nodule is worrisome for bronchogenic carcinoma. 2. Spiculated left upper lobe nodule and low right paratracheal lymph node have decreased in metabolism in the interval, compatible with treatment response. 3. Focal nodular hypermetabolism in the prostate. Malignancy cannot be excluded. 4. Right adrenal adenoma.  Left adrenal myelolipoma. 5. Aortic atherosclerosis (ICD10-I70.0). Coronary artery calcification. 6. Enlarged pulmonic trunk, indicative of pulmonary arterial hypertension. 7.  Emphysema (ICD10-J43.9).   05/01/2024 - 05/08/2024 Radiation  Therapy   Patient completed SBRT to right middle lobe lesion     Discussed the use of AI scribe software for clinical note transcription with the patient, who gave verbal consent to proceed.   he feels breathing status is at baseline today. No skin rash, diarrhea he is on Eliquis  2.5mg  BID and Plavix . No bleeding events.    MEDICAL HISTORY:  Past Medical History:  Diagnosis Date   Acute on chronic respiratory failure with hypoxia (HCC)    Asthma    Coagulation disorder    COPD (chronic obstructive pulmonary disease) (HCC)    Depression    History of hiatal hernia    Hypertension    Hypokalemia    Leukocytosis    Lung mass    Normocytic anemia 07/13/2023   Pneumonia    Schizophrenia (HCC)    Shortness of breath dyspnea    Stroke [facility])    Umbilical hernia    Ventral hernia     SURGICAL HISTORY: Past Surgical History:  Procedure  Laterality Date   COLONOSCOPY WITH PROPOFOL  N/A 09/21/2021   Procedure: COLONOSCOPY WITH PROPOFOL ;  Surgeon: Maryruth Ole DASEN, MD;  Location: [facility] ENDOSCOPY;  Service: Endoscopy;  Laterality: N/A;   HERNIA REPAIR     INSERTION OF MESH N/A 12/18/2014   Procedure: INSERTION OF MESH;  Surgeon: Charlie FORBES Fell, MD;  Location: [facility] ORS;  Service: General;  Laterality: N/A;   SUPRA-UMBILICAL HERNIA  12/18/2014   Procedure: SUPRA-UMBILICAL HERNIA;  Surgeon: Charlie FORBES Fell, MD;  Location: [facility] ORS;  Service: General;;   UMBILICAL HERNIA REPAIR N/A 12/18/2014   Procedure: HERNIA REPAIR UMBILICAL ADULT;  Surgeon: Charlie FORBES Fell, MD;  Location: [facility] ORS;  Service: General;  Laterality: N/A;   VIDEO BRONCHOSCOPY WITH ENDOBRONCHIAL ULTRASOUND N/A 03/15/2023   Procedure: VIDEO BRONCHOSCOPY WITH ENDOBRONCHIAL ULTRASOUND;  Surgeon: Parris Manna, MD;  Location: [facility] ORS;  Service: Thoracic;  Laterality: N/A;    SOCIAL HISTORY: Social History   Socioeconomic History   Marital status: Widowed    Spouse name: Not on file   Number of children: Not on file    Years of education: Not on file   Highest education level: Not on file  Occupational History   Not on file  Tobacco Use   Smoking status: Former    Current packs/day: 0.15    Average packs/day: 0.2 packs/day for 45.0 years (6.8 ttl pk-yrs)    Types: Cigarettes   Smokeless tobacco: Never  Vaping Use   Vaping status: Never Used  Substance and Sexual Activity   Alcohol use: Not Currently    Alcohol/week: 75.0 standard drinks of alcohol    Types: 75 Cans of beer per week   Drug use: Not Currently    Types: Marijuana, Crack cocaine    Comment: none in 15 yrs   Sexual activity: Not Currently  Other Topics Concern   Not on file  Social History Narrative   Not on file   Social Drivers of Health   Tobacco Use: Medium Risk (09/12/2024)   Patient History    Smoking Tobacco Use: Former    Smokeless Tobacco Use: Never    Passive Exposure: Not on file  Financial Resource Strain: Medium Risk (08/18/2023)   [facility]   Overall Financial Resource Strain (CARDIA)    Difficulty of Paying Living Expenses: Somewhat hard  Food Insecurity: No Food Insecurity (10/17/2023)   Hunger Vital Sign    Worried About Running Out of Food in the Last Year: Never true    Ran Out of Food in the Last Year: Never true  Transportation Needs: No Transportation Needs (10/17/2023)   PRAPARE - Administrator, Civil Service (Medical): No    Lack of Transportation (Non-Medical): No  Physical Activity: Not on file  Stress: Not on file  Social Connections: Not on file  Intimate Partner Violence: Not At Risk (10/17/2023)   Humiliation, Afraid, Rape, and Kick questionnaire    Fear of Current or Ex-Partner: No    Emotionally Abused: No    Physically Abused: No    Sexually Abused: No  Depression (PHQ2-9): Low Risk (09/12/2024)   Depression (PHQ2-9)    PHQ-2 Score: 0  Alcohol Screen: Low Risk (06/16/2023)   Alcohol Screen    Last Alcohol Screening Score (AUDIT): 0   Housing: Low Risk (10/17/2023)   Housing Stability Vital Sign    Unable to Pay for Housing in the Last Year: No    Number of Times Moved in the Last Year:  0    Homeless in the Last Year: No  Utilities: Not At Risk (10/17/2023)   AHC Utilities    Threatened with loss of utilities: No  Health Literacy: Not on file    FAMILY HISTORY: Family History  Problem Relation Age of Onset   Asthma Mother    Hypertension Father     ALLERGIES:  has no known allergies.  MEDICATIONS:  Current Outpatient Medications  Medication Sig Dispense Refill   albuterol  (VENTOLIN  HFA) 108 (90 Base) MCG/ACT inhaler Inhale 1-2 puffs into the lungs every 6 (six) hours as needed for wheezing or shortness of breath. 8 g 0   apixaban  (ELIQUIS ) 2.5 MG TABS tablet Take 1 tablet (2.5 mg total) by mouth 2 (two) times daily. 60 tablet 10   clopidogrel  (PLAVIX ) 75 MG tablet Take 1 tablet (75 mg total) by mouth daily. 30 tablet 0   clotrimazole -betamethasone  (LOTRISONE ) cream Apply 1 Application topically daily. 30 g 0   diphenhydrAMINE  (BENADRYL ) 50 MG tablet Take 1 tablet (50 mg total) by mouth every 8 (eight) hours as needed for itching. Take 1 tablet the bedtime the night before chemo and the night of chemo. 60 tablet 0   famotidine  (PEPCID ) 20 MG tablet Take 1 tablet (20 mg total) by mouth daily. 30 tablet 0   FLUoxetine  (PROZAC ) 20 MG capsule Take 1 capsule (20 mg total) by mouth daily. 30 capsule 3   Fluticasone -Umeclidin-Vilant (TRELEGY ELLIPTA ) 100-62.5-25 MCG/ACT AEPB Inhale 1 puff into the lungs daily. 28 each 1   gabapentin  (NEURONTIN ) 100 MG capsule Take 100 mg by mouth daily.     guaiFENesin  (MUCINEX ) 600 MG 12 hr tablet Take 1 tablet (600 mg total) by mouth 2 (two) times daily. 30 tablet 1   hydrOXYzine  (ATARAX ) 10 MG tablet Take 10 mg by mouth 3 (three) times daily.     INGREZZA 40 MG capsule Take 40 mg by mouth daily.     mirtazapine  (REMERON ) 7.5 MG tablet Take 1 tablet (7.5 mg total) by mouth at bedtime.  30 tablet 5   montelukast  (SINGULAIR ) 10 MG tablet Take 1 tablet (10 mg total) by mouth at bedtime. 30 tablet 1   ondansetron  (ZOFRAN ) 4 MG tablet Take 1 tablet (4 mg total) by mouth every 8 (eight) hours as needed for nausea or vomiting. 30 tablet 0   potassium chloride  SA (KLOR-CON  M) 20 MEQ tablet Take 1 tablet (20 mEq total) by mouth daily. 3 tablet 0   pravastatin  (PRAVACHOL ) 20 MG tablet Take 1 tablet (20 mg total) by mouth daily at 6 PM. 30 tablet 0   QUEtiapine  (SEROQUEL ) 300 MG tablet Take 2 tablets (600 mg total) by mouth at bedtime. 60 tablet 0   senna-docusate (SENOKOT-S) 8.6-50 MG tablet Take 1 tablet by mouth daily after breakfast. 30 tablet 0   traZODone  (DESYREL ) 100 MG tablet Take 2 tablets (200 mg total) by mouth at bedtime as needed for sleep. 30 tablet 0   No current facility-administered medications for this visit.   Facility-Administered Medications Ordered in Other Visits  Medication Dose Route Frequency Provider Last Rate Last Admin   0.9 %  sodium chloride  infusion   Intravenous Continuous Babara Call, MD   Stopped at 09/12/24 1111    Review of Systems  Constitutional:  Negative for appetite change, chills, fatigue, fever and unexpected weight change.  HENT:   Negative for hearing loss and voice change.   Eyes:  Negative for eye problems and icterus.  Respiratory:  Negative for  chest tightness, cough and hemoptysis.   Cardiovascular:  Negative for chest pain and leg swelling.  Gastrointestinal:  Negative for abdominal distention and abdominal pain.  Endocrine: Negative for hot flashes.  Genitourinary:  Negative for difficulty urinating, dysuria and frequency.   Musculoskeletal:  Negative for arthralgias.  Skin:  Negative for itching and rash.  Neurological:  Negative for light-headedness and numbness.  Hematological:  Negative for adenopathy. Does not bruise/bleed easily.  Psychiatric/Behavioral:  Negative for confusion.      PHYSICAL EXAMINATION: ECOG  PERFORMANCE STATUS: 1 - Symptomatic but completely ambulatory  Vitals:   09/12/24 0851 09/12/24 0857  BP: (!) 144/83 135/84  Pulse: 85   Resp: 18   Temp: (!) 97.2 F (36.2 C)   SpO2: 95%    Filed Weights   09/12/24 0851  Weight: 213 lb 1.6 oz (96.7 kg)    Physical Exam Constitutional:      General: He is not in acute distress.    Appearance: He is not diaphoretic.  HENT:     Head: Normocephalic and atraumatic.  Eyes:     General: No scleral icterus. Cardiovascular:     Rate and Rhythm: Normal rate and regular rhythm.  Pulmonary:     Effort: Pulmonary effort is normal. No respiratory distress.     Breath sounds: Normal breath sounds. No wheezing.     Comments: Decreased breath sound bilaterally, poor air entry Abdominal:     General: There is no distension.     Palpations: Abdomen is soft.  Musculoskeletal:        General: Normal range of motion.     Cervical back: Normal range of motion and neck supple.  Skin:    General: Skin is warm and dry.     Findings: No erythema.  Neurological:     Mental Status: He is alert and oriented to person, place, and time. Mental status is at baseline.     Motor: No abnormal muscle tone.  Psychiatric:        Mood and Affect: Affect normal.     Comments: Flat      LABORATORY DATA:  I have reviewed the data as listed    Latest Ref Rng & Units 09/12/2024    8:41 AM 08/29/2024    8:59 AM 08/15/2024    8:24 AM  CBC  WBC 4.0 - 10.5 K/uL 6.1  5.3  4.2   Hemoglobin 13.0 - 17.0 g/dL 87.6  88.2  88.4   Hematocrit 39.0 - 52.0 % 38.6  36.7  35.6   Platelets 150 - 400 K/uL 204  181  196       Latest Ref Rng & Units 09/12/2024    8:41 AM 08/29/2024    8:59 AM 08/15/2024    8:24 AM  CMP  Glucose 70 - 99 mg/dL 891  899  896   BUN 8 - 23 mg/dL 8  7  7    Creatinine 0.61 - 1.24 mg/dL 9.13  9.24  9.17   Sodium 135 - 145 mmol/L 141  139  139   Potassium 3.5 - 5.1 mmol/L 4.3  4.2  3.9   Chloride 98 - 111 mmol/L 105  105  105   CO2 22 - 32  mmol/L 27  25  26    Calcium 8.9 - 10.3 mg/dL 9.4  9.4  9.2   Total Protein 6.5 - 8.1 g/dL 6.9  7.2  6.9   Total Bilirubin 0.0 - 1.2 mg/dL 0.4  0.4  0.4   Alkaline Phos 38 - 126 U/L 80  79  71   AST 15 - 41 U/L 27  22  24    ALT 0 - 44 U/L 28  19  21       RADIOGRAPHIC STUDIES: I have personally reviewed the radiological images as listed and agreed with the findings in the report. No results found.     "

## 2024-09-12 NOTE — Assessment & Plan Note (Signed)
 Continue Elqiuis to 2.5mg  BID. He is on Plavix  75mg  daily.

## 2024-09-12 NOTE — Assessment & Plan Note (Signed)
 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  Left upper lobe adenocarcinoma, No enough tissue for NGS, liquid biopsy showed actionable variants.   S/p concurrent chemotherapy radiation. -carboplatin  AUC 2 and taxol  45mg /m2  On Durvalumab  maintenance, which was held from July to Nov 2025.  July 2025 CT- increased right middle lobe lesion, s/p  radiation to the right middle lobe lesion suspicious for synchronous neoplasm.  Clinically he is doing well today Labs are reviewed and discussed with patient. Proceed with Durvalumab  maintenance

## 2024-09-12 NOTE — Patient Instructions (Signed)
 [facility] - A DEPT OF [facility]  Discharge Instructions: Thank you for choosing [facility] to provide your oncology and hematology care.  If you have a lab appointment with the Cancer Center, please go directly to the Cancer Center and check in at the registration area.  Wear comfortable clothing and clothing appropriate for easy access to any Portacath or PICC line.   We strive to give you quality time with your provider. You may need to reschedule your appointment if you arrive late (15 or more minutes).  Arriving late affects you and other patients whose appointments are after yours.  Also, if you miss three or more appointments without notifying the office, you may be dismissed from the clinic at the provider's discretion.      For prescription refill requests, have your pharmacy contact our office and allow 72 hours for refills to be completed.    Today you received the following chemotherapy and/or immunotherapy agents IMFINZI      To help prevent nausea and vomiting after your treatment, we encourage you to take your nausea medication as directed.  BELOW ARE SYMPTOMS THAT SHOULD BE REPORTED IMMEDIATELY: *FEVER GREATER THAN 100.4 F (38 C) OR HIGHER *CHILLS OR SWEATING *NAUSEA AND VOMITING THAT IS NOT CONTROLLED WITH YOUR NAUSEA MEDICATION *UNUSUAL SHORTNESS OF BREATH *UNUSUAL BRUISING OR BLEEDING *URINARY PROBLEMS (pain or burning when urinating, or frequent urination) *BOWEL PROBLEMS (unusual diarrhea, constipation, pain near the anus) TENDERNESS IN MOUTH AND THROAT WITH OR WITHOUT PRESENCE OF ULCERS (sore throat, sores in mouth, or a toothache) UNUSUAL RASH, SWELLING OR PAIN  UNUSUAL VAGINAL DISCHARGE OR ITCHING   Items with * indicate a potential emergency and should be followed up as soon as possible or go to the Emergency Department if any problems should occur.  Please show the CHEMOTHERAPY ALERT CARD or IMMUNOTHERAPY  ALERT CARD at check-in to the Emergency Department and triage nurse.  Should you have questions after your visit or need to cancel or reschedule your appointment, please contact [facility] - A DEPT OF [facility]  [phone] and follow the prompts.  Office hours are 8:00 a.m. to 4:30 p.m. Monday - Friday. Please note that voicemails left after 4:00 p.m. may not be returned until the following business day.  We are closed weekends and major holidays. You have access to a nurse at all times for urgent questions. Please call the main number to the clinic [phone] and follow the prompts.  For any non-urgent questions, you may also contact your provider using MyChart. We now offer e-Visits for anyone 30 and older to request care online for non-urgent symptoms. For details visit [email or web address].   Also download the MyChart app! Go to the app store, search "MyChart", open the app, select [facility], and log in with your MyChart username and password.  Durvalumab Injection What is this medication? DURVALUMAB (dur VAL ue mab) treats some types of cancer. It works by helping your immune system slow or stop the spread of cancer cells. It is a monoclonal antibody. This medicine may be used for other purposes; ask your health care provider or pharmacist if you have questions. COMMON BRAND NAME(S): IMFINZI What should I tell my care team before I take this medication? They need to know if you have any of these conditions: Allogeneic stem cell transplant (uses someone else's stem cells) Autoimmune diseases, such as Crohn disease, ulcerative  colitis, lupus History of chest radiation Nervous system problems, such as Guillain-Barre syndrome, myasthenia gravis Organ transplant An unusual or allergic reaction to durvalumab, other medications, foods, dyes, or preservatives Pregnant or trying to get pregnant Breast-feeding How should I use this medication? This  medication is infused into a vein. It is given by your care team in a hospital or clinic setting. A special MedGuide will be given to you before each treatment. Be sure to read this information carefully each time. Talk to your care team about the use of this medication in children. Special care may be needed. Overdosage: If you think you have taken too much of this medicine contact a poison control center or emergency room at once. NOTE: This medicine is only for you. Do not share this medicine with others. What if I miss a dose? Keep appointments for follow-up doses. It is important not to miss your dose. Call your care team if you are unable to keep an appointment. What may interact with this medication? Interactions have not been studied. This list may not describe all possible interactions. Give your health care provider a list of all the medicines, herbs, non-prescription drugs, or dietary supplements you use. Also tell them if you smoke, drink alcohol, or use illegal drugs. Some items may interact with your medicine. What should I watch for while using this medication? Your condition will be monitored carefully while you are receiving this medication. You may need blood work while taking this medication. This medication may cause serious skin reactions. They can happen weeks to months after starting the medication. Contact your care team right away if you notice fevers or flu-like symptoms with a rash. The rash may be red or purple and then turn into blisters or peeling of the skin. You may also notice a red rash with swelling of the face, lips, or lymph nodes in your neck or under your arms. Tell your care team right away if you have any change in your eyesight. Talk to your care team if you may be pregnant. Serious birth defects can occur if you take this medication during pregnancy and for 3 months after the last dose. You will need a negative pregnancy test before starting this medication.  Contraception is recommended while taking this medication and for 3 months after the last dose. Your care team can help you find the option that works for you. Do not breastfeed while taking this medication and for 3 months after the last dose. What side effects may I notice from receiving this medication? Side effects that you should report to your care team as soon as possible: Allergic reactions--skin rash, itching, hives, swelling of the face, lips, tongue, or throat Dry cough, shortness of breath or trouble breathing Eye pain, redness, irritation, or discharge with blurry or decreased vision Heart muscle inflammation--unusual weakness or fatigue, shortness of breath, chest pain, fast or irregular heartbeat, dizziness, swelling of the ankles, feet, or hands Hormone gland problems--headache, sensitivity to light, unusual weakness or fatigue, dizziness, fast or irregular heartbeat, increased sensitivity to cold or heat, excessive sweating, constipation, hair loss, increased thirst or amount of urine, tremors or shaking, irritability Infusion reactions--chest pain, shortness of breath or trouble breathing, feeling faint or lightheaded Kidney injury (glomerulonephritis)--decrease in the amount of urine, red or dark brown urine, foamy or bubbly urine, swelling of the ankles, hands, or feet Liver injury--right upper belly pain, loss of appetite, nausea, light-colored stool, dark yellow or brown urine, yellowing skin or eyes,  unusual weakness or fatigue Pain, tingling, or numbness in the hands or feet, muscle weakness, change in vision, confusion or trouble speaking, loss of balance or coordination, trouble walking, seizures Rash, fever, and swollen lymph nodes Redness, blistering, peeling, or loosening of the skin, including inside the mouth Sudden or severe stomach pain, bloody diarrhea, fever, nausea, vomiting Side effects that usually do not require medical attention (report these to your care team  if they continue or are bothersome): Bone, joint, or muscle pain Diarrhea Fatigue Loss of appetite Nausea Skin rash This list may not describe all possible side effects. Call your doctor for medical advice about side effects. You may report side effects to FDA at 1-800-FDA-1088. Where should I keep my medication? This medication is given in a hospital or clinic. It will not be stored at home. NOTE: This sheet is a summary. It may not cover all possible information. If you have questions about this medicine, talk to your doctor, pharmacist, or health care provider.  2024 [employer]/Gold Standard (2021-12-07 00:00:00)

## 2024-09-12 NOTE — Assessment & Plan Note (Signed)
 Continue albuterol inhaler PRN, Duonebs.  follow up with pulmonology Dr. Meredeth Ide.  Continue Singulair, mucinex.

## 2024-09-12 NOTE — Assessment & Plan Note (Signed)
 Immunotherapy plan as planned.

## 2024-09-13 ENCOUNTER — Ambulatory Visit: Admission: RE | Admit: 2024-09-13 | Source: Ambulatory Visit

## 2024-09-13 DIAGNOSIS — C3411 Malignant neoplasm of upper lobe, right bronchus or lung: Secondary | ICD-10-CM

## 2024-09-13 MED ORDER — IOHEXOL 300 MG/ML  SOLN
75.0000 mL | Freq: Once | INTRAMUSCULAR | Status: AC | PRN
  Administered 2024-09-13: 75 mL via INTRAVENOUS

## 2024-09-23 ENCOUNTER — Ambulatory Visit: Admitting: Radiation Oncology

## 2024-09-26 ENCOUNTER — Inpatient Hospital Stay

## 2024-09-26 ENCOUNTER — Inpatient Hospital Stay: Admitting: Oncology

## 2024-09-30 ENCOUNTER — Ambulatory Visit: Admitting: Podiatry

## 2024-10-10 ENCOUNTER — Inpatient Hospital Stay

## 2024-10-10 ENCOUNTER — Inpatient Hospital Stay: Admitting: Oncology
